# Patient Record
Sex: Male | Born: 1950
Health system: Southern US, Academic
[De-identification: ages and names within clinical notes are randomized; demographics above are authoritative.]

## PROBLEM LIST (undated history)

## (undated) ENCOUNTER — Encounter: Attending: Gastroenterology | Primary: Gastroenterology

## (undated) ENCOUNTER — Encounter: Attending: Nephrology | Primary: Nephrology

## (undated) ENCOUNTER — Ambulatory Visit

## (undated) ENCOUNTER — Encounter

## (undated) ENCOUNTER — Telehealth

## (undated) ENCOUNTER — Inpatient Hospital Stay

## (undated) ENCOUNTER — Ambulatory Visit
Attending: Student in an Organized Health Care Education/Training Program | Primary: Student in an Organized Health Care Education/Training Program

## (undated) ENCOUNTER — Encounter: Attending: Family Medicine | Primary: Family Medicine

## (undated) ENCOUNTER — Ambulatory Visit: Payer: MEDICARE

## (undated) ENCOUNTER — Encounter: Attending: Internal Medicine | Primary: Internal Medicine

## (undated) ENCOUNTER — Telehealth: Attending: Urology | Primary: Urology

## (undated) ENCOUNTER — Telehealth
Attending: Student in an Organized Health Care Education/Training Program | Primary: Student in an Organized Health Care Education/Training Program

## (undated) ENCOUNTER — Ambulatory Visit: Payer: MEDICARE | Attending: Diagnostic Radiology | Primary: Diagnostic Radiology

## (undated) ENCOUNTER — Ambulatory Visit: Payer: MEDICARE | Attending: Nephrology | Primary: Nephrology

## (undated) ENCOUNTER — Ambulatory Visit: Attending: Nephrology | Primary: Nephrology

## (undated) DIAGNOSIS — Z940 Kidney transplant status: Secondary | ICD-10-CM

## (undated) DIAGNOSIS — N289 Disorder of kidney and ureter, unspecified: Secondary | ICD-10-CM

## (undated) DIAGNOSIS — E079 Disorder of thyroid, unspecified: Secondary | ICD-10-CM

## (undated) DIAGNOSIS — IMO0001 Reserved for inherently not codable concepts without codable children: Secondary | ICD-10-CM

## (undated) DIAGNOSIS — I10 Essential (primary) hypertension: Secondary | ICD-10-CM

## (undated) HISTORY — PX: OTHER SURGICAL HISTORY: SHX169

## (undated) HISTORY — PX: NEPHRECTOMY TRANSPLANTED ORGAN: SUR880

---

## 2004-10-09 ENCOUNTER — Ambulatory Visit: Payer: Self-pay | Admitting: Nephrology

## 2005-02-12 ENCOUNTER — Ambulatory Visit: Payer: Self-pay | Admitting: Internal Medicine

## 2006-12-14 DIAGNOSIS — Z940 Kidney transplant status: Secondary | ICD-10-CM

## 2006-12-14 HISTORY — DX: Kidney transplant status: Z94.0

## 2006-12-23 ENCOUNTER — Ambulatory Visit: Payer: Self-pay | Admitting: Nephrology

## 2007-08-16 ENCOUNTER — Ambulatory Visit: Payer: Self-pay | Admitting: Internal Medicine

## 2007-08-25 ENCOUNTER — Ambulatory Visit: Payer: Self-pay | Admitting: Internal Medicine

## 2011-08-14 ENCOUNTER — Ambulatory Visit: Payer: Self-pay

## 2011-09-25 ENCOUNTER — Ambulatory Visit: Payer: Self-pay

## 2011-12-25 LAB — CBC WITH DIFFERENTIAL/PLATELET
Basophil #: 0 10*3/uL (ref 0.0–0.1)
Eosinophil #: 0.2 10*3/uL (ref 0.0–0.7)
HCT: 35.8 % — ABNORMAL LOW (ref 40.0–52.0)
Lymphocyte #: 1.3 10*3/uL (ref 1.0–3.6)
MCH: 26.2 pg (ref 26.0–34.0)
MCHC: 31.3 g/dL — ABNORMAL LOW (ref 32.0–36.0)
MCV: 84 fL (ref 80–100)
Monocyte #: 0.4 10*3/uL (ref 0.0–0.7)
Monocyte %: 6.5 %
Neutrophil #: 4.7 10*3/uL (ref 1.4–6.5)
Platelet: 165 10*3/uL (ref 150–440)
RDW: 13.5 % (ref 11.5–14.5)

## 2011-12-25 LAB — BASIC METABOLIC PANEL
Anion Gap: 8 (ref 7–16)
BUN: 50 mg/dL — ABNORMAL HIGH (ref 7–18)
Chloride: 107 mmol/L (ref 98–107)
Creatinine: 2.13 mg/dL — ABNORMAL HIGH (ref 0.60–1.30)
EGFR (Non-African Amer.): 34 — ABNORMAL LOW
Glucose: 95 mg/dL (ref 65–99)
Osmolality: 294 (ref 275–301)
Potassium: 4 mmol/L (ref 3.5–5.1)
Sodium: 141 mmol/L (ref 136–145)

## 2011-12-30 LAB — CBC WITH DIFFERENTIAL/PLATELET
Basophil #: 0.1 10*3/uL (ref 0.0–0.1)
Eosinophil #: 0.1 10*3/uL (ref 0.0–0.7)
Eosinophil %: 1.7 %
HGB: 11.1 g/dL — ABNORMAL LOW (ref 13.0–18.0)
Lymphocyte #: 1.3 10*3/uL (ref 1.0–3.6)
Lymphocyte %: 18 %
MCHC: 30.8 g/dL — ABNORMAL LOW (ref 32.0–36.0)
MCV: 84 fL (ref 80–100)
Monocyte %: 8.8 %
Neutrophil %: 70.8 %
Platelet: 143 10*3/uL — ABNORMAL LOW (ref 150–440)
RBC: 4.31 10*6/uL — ABNORMAL LOW (ref 4.40–5.90)
RDW: 13 % (ref 11.5–14.5)
WBC: 7.2 10*3/uL (ref 3.8–10.6)

## 2011-12-30 LAB — BASIC METABOLIC PANEL
Calcium, Total: 9.5 mg/dL (ref 8.5–10.1)
Chloride: 103 mmol/L (ref 98–107)
Co2: 28 mmol/L (ref 21–32)
Creatinine: 2.04 mg/dL — ABNORMAL HIGH (ref 0.60–1.30)
Glucose: 111 mg/dL — ABNORMAL HIGH (ref 65–99)
Osmolality: 286 (ref 275–301)
Potassium: 3.7 mmol/L (ref 3.5–5.1)
Sodium: 138 mmol/L (ref 136–145)

## 2011-12-30 LAB — MAGNESIUM: Magnesium: 1.8 mg/dL

## 2012-03-29 ENCOUNTER — Ambulatory Visit: Payer: Self-pay | Admitting: Internal Medicine

## 2012-04-04 ENCOUNTER — Ambulatory Visit: Payer: Self-pay

## 2012-04-04 LAB — PHOSPHORUS: Phosphorus: 2.6 mg/dL (ref 2.5–4.9)

## 2012-04-04 LAB — BASIC METABOLIC PANEL
Anion Gap: 8 (ref 7–16)
Creatinine: 2 mg/dL — ABNORMAL HIGH (ref 0.60–1.30)
EGFR (African American): 41 — ABNORMAL LOW
EGFR (Non-African Amer.): 36 — ABNORMAL LOW
Glucose: 95 mg/dL (ref 65–99)
Osmolality: 282 (ref 275–301)
Potassium: 4.1 mmol/L (ref 3.5–5.1)
Sodium: 134 mmol/L — ABNORMAL LOW (ref 136–145)

## 2012-04-04 LAB — CBC WITH DIFFERENTIAL/PLATELET
Basophil #: 0 10*3/uL (ref 0.0–0.1)
Basophil %: 0.1 %
Eosinophil %: 1.1 %
HGB: 11.7 g/dL — ABNORMAL LOW (ref 13.0–18.0)
Lymphocyte #: 1.7 10*3/uL (ref 1.0–3.6)
MCH: 25.8 pg — ABNORMAL LOW (ref 26.0–34.0)
MCHC: 31.5 g/dL — ABNORMAL LOW (ref 32.0–36.0)
MCV: 82 fL (ref 80–100)
Monocyte #: 0.9 x10 3/mm (ref 0.2–1.0)
Monocyte %: 8.9 %
Neutrophil #: 7.9 10*3/uL — ABNORMAL HIGH (ref 1.4–6.5)
RBC: 4.51 10*6/uL (ref 4.40–5.90)
RDW: 13.8 % (ref 11.5–14.5)
WBC: 10.6 10*3/uL (ref 3.8–10.6)

## 2012-04-04 LAB — MAGNESIUM: Magnesium: 1.6 mg/dL — ABNORMAL LOW

## 2012-06-02 ENCOUNTER — Ambulatory Visit: Payer: Self-pay

## 2012-06-02 LAB — CBC WITH DIFFERENTIAL/PLATELET
Basophil #: 0 10*3/uL (ref 0.0–0.1)
Eosinophil #: 0.1 10*3/uL (ref 0.0–0.7)
MCH: 25.8 pg — ABNORMAL LOW (ref 26.0–34.0)
MCHC: 31.1 g/dL — ABNORMAL LOW (ref 32.0–36.0)
MCV: 83 fL (ref 80–100)
Monocyte #: 0.5 x10 3/mm (ref 0.2–1.0)
Monocyte %: 9.1 %
Neutrophil %: 70.1 %
Platelet: 138 10*3/uL — ABNORMAL LOW (ref 150–440)

## 2012-06-02 LAB — BASIC METABOLIC PANEL
Anion Gap: 9 (ref 7–16)
BUN: 53 mg/dL — ABNORMAL HIGH (ref 7–18)
Chloride: 105 mmol/L (ref 98–107)
Co2: 26 mmol/L (ref 21–32)
Creatinine: 2.07 mg/dL — ABNORMAL HIGH (ref 0.60–1.30)
EGFR (African American): 40 — ABNORMAL LOW
EGFR (Non-African Amer.): 34 — ABNORMAL LOW
Osmolality: 295 (ref 275–301)
Potassium: 3.6 mmol/L (ref 3.5–5.1)
Sodium: 140 mmol/L (ref 136–145)

## 2012-06-02 LAB — PHOSPHORUS: Phosphorus: 3 mg/dL (ref 2.5–4.9)

## 2012-06-02 LAB — MAGNESIUM: Magnesium: 1.7 mg/dL — ABNORMAL LOW

## 2012-07-11 ENCOUNTER — Ambulatory Visit: Payer: Self-pay | Admitting: Emergency Medicine

## 2012-09-08 LAB — BASIC METABOLIC PANEL
Anion Gap: 7 (ref 7–16)
BUN: 47 mg/dL — ABNORMAL HIGH (ref 7–18)
Chloride: 106 mmol/L (ref 98–107)
Creatinine: 1.9 mg/dL — ABNORMAL HIGH (ref 0.60–1.30)
EGFR (African American): 44 — ABNORMAL LOW
EGFR (Non-African Amer.): 38 — ABNORMAL LOW
Glucose: 121 mg/dL — ABNORMAL HIGH (ref 65–99)
Osmolality: 295 (ref 275–301)
Potassium: 3.9 mmol/L (ref 3.5–5.1)

## 2012-09-08 LAB — CBC WITH DIFFERENTIAL/PLATELET
Basophil %: 0.4 %
Eosinophil %: 1.7 %
HCT: 36.7 % — ABNORMAL LOW (ref 40.0–52.0)
Lymphocyte #: 1.2 10*3/uL (ref 1.0–3.6)
Lymphocyte %: 20.4 %
MCH: 26 pg (ref 26.0–34.0)
MCHC: 31.1 g/dL — ABNORMAL LOW (ref 32.0–36.0)
MCV: 84 fL (ref 80–100)
Monocyte #: 0.5 x10 3/mm (ref 0.2–1.0)
Monocyte %: 9.5 %
Neutrophil #: 3.9 10*3/uL (ref 1.4–6.5)
Neutrophil %: 68 %
Platelet: 130 10*3/uL — ABNORMAL LOW (ref 150–440)
RDW: 14.3 % (ref 11.5–14.5)

## 2012-09-08 LAB — PHOSPHORUS: Phosphorus: 2.5 mg/dL (ref 2.5–4.9)

## 2012-09-08 LAB — MAGNESIUM: Magnesium: 1.6 mg/dL — ABNORMAL LOW

## 2012-10-07 LAB — CBC WITH DIFFERENTIAL/PLATELET
Basophil #: 0 10*3/uL (ref 0.0–0.1)
Basophil %: 0.4 %
Eosinophil %: 1.8 %
HCT: 35.8 % — ABNORMAL LOW (ref 40.0–52.0)
HGB: 11.1 g/dL — ABNORMAL LOW (ref 13.0–18.0)
MCH: 26 pg (ref 26.0–34.0)
MCHC: 31.1 g/dL — ABNORMAL LOW (ref 32.0–36.0)
MCV: 84 fL (ref 80–100)
Monocyte #: 0.6 x10 3/mm (ref 0.2–1.0)
Monocyte %: 10.2 %
Neutrophil #: 3.9 10*3/uL (ref 1.4–6.5)
Neutrophil %: 64.4 %
RBC: 4.28 10*6/uL — ABNORMAL LOW (ref 4.40–5.90)
WBC: 6 10*3/uL (ref 3.8–10.6)

## 2012-10-07 LAB — MAGNESIUM: Magnesium: 1.5 mg/dL — ABNORMAL LOW

## 2012-10-07 LAB — BASIC METABOLIC PANEL
Anion Gap: 8 (ref 7–16)
Chloride: 104 mmol/L (ref 98–107)
Co2: 26 mmol/L (ref 21–32)
Creatinine: 1.9 mg/dL — ABNORMAL HIGH (ref 0.60–1.30)
Sodium: 138 mmol/L (ref 136–145)

## 2012-10-07 LAB — PHOSPHORUS: Phosphorus: 2.6 mg/dL (ref 2.5–4.9)

## 2012-12-22 LAB — PHOSPHORUS: Phosphorus: 2.8 mg/dL (ref 2.5–4.9)

## 2012-12-22 LAB — CBC WITH DIFFERENTIAL/PLATELET
Basophil #: 0 10*3/uL (ref 0.0–0.1)
Eosinophil #: 0.1 10*3/uL (ref 0.0–0.7)
Eosinophil %: 1.5 %
HCT: 36 % — ABNORMAL LOW (ref 40.0–52.0)
Lymphocyte #: 0.9 10*3/uL — ABNORMAL LOW (ref 1.0–3.6)
MCV: 82 fL (ref 80–100)
Monocyte #: 0.7 x10 3/mm (ref 0.2–1.0)
Neutrophil #: 4.2 10*3/uL (ref 1.4–6.5)
Neutrophil %: 71.5 %
Platelet: 125 10*3/uL — ABNORMAL LOW (ref 150–440)
RBC: 4.41 10*6/uL (ref 4.40–5.90)
RDW: 14 % (ref 11.5–14.5)
WBC: 5.9 10*3/uL (ref 3.8–10.6)

## 2012-12-22 LAB — BASIC METABOLIC PANEL
Anion Gap: 9 (ref 7–16)
Calcium, Total: 9.6 mg/dL (ref 8.5–10.1)
Co2: 26 mmol/L (ref 21–32)
Creatinine: 2.13 mg/dL — ABNORMAL HIGH (ref 0.60–1.30)
EGFR (Non-African Amer.): 33 — ABNORMAL LOW
Glucose: 107 mg/dL — ABNORMAL HIGH (ref 65–99)
Osmolality: 294 (ref 275–301)

## 2013-01-10 ENCOUNTER — Ambulatory Visit: Payer: Self-pay | Admitting: Family Medicine

## 2013-04-07 ENCOUNTER — Encounter: Payer: Self-pay | Admitting: Nephrology

## 2013-04-07 LAB — CBC WITH DIFFERENTIAL/PLATELET
Basophil #: 0 10*3/uL (ref 0.0–0.1)
Basophil %: 0.4 %
Eosinophil %: 2.3 %
HCT: 34 % — ABNORMAL LOW (ref 40.0–52.0)
Lymphocyte #: 1 10*3/uL (ref 1.0–3.6)
Lymphocyte %: 17.8 %
MCH: 24.9 pg — ABNORMAL LOW (ref 26.0–34.0)
MCHC: 30.5 g/dL — ABNORMAL LOW (ref 32.0–36.0)
MCV: 82 fL (ref 80–100)
Monocyte #: 0.6 x10 3/mm (ref 0.2–1.0)
Monocyte %: 11 %
Platelet: 177 10*3/uL (ref 150–440)
WBC: 5.9 10*3/uL (ref 3.8–10.6)

## 2013-04-07 LAB — BASIC METABOLIC PANEL
BUN: 38 mg/dL — ABNORMAL HIGH (ref 7–18)
Calcium, Total: 9.4 mg/dL (ref 8.5–10.1)
Chloride: 106 mmol/L (ref 98–107)
Co2: 28 mmol/L (ref 21–32)
Creatinine: 2.05 mg/dL — ABNORMAL HIGH (ref 0.60–1.30)
EGFR (African American): 40 — ABNORMAL LOW
Glucose: 92 mg/dL (ref 65–99)
Osmolality: 292 (ref 275–301)
Potassium: 4 mmol/L (ref 3.5–5.1)
Sodium: 142 mmol/L (ref 136–145)

## 2013-04-07 LAB — MAGNESIUM: Magnesium: 1.5 mg/dL — ABNORMAL LOW

## 2013-04-13 ENCOUNTER — Encounter: Payer: Self-pay | Admitting: Nephrology

## 2013-06-01 ENCOUNTER — Ambulatory Visit: Payer: Self-pay | Admitting: Nephrology

## 2013-06-01 LAB — CREATININE, SERUM
Creatinine: 2.22 mg/dL — ABNORMAL HIGH (ref 0.60–1.30)
EGFR (African American): 36 — ABNORMAL LOW
EGFR (Non-African Amer.): 31 — ABNORMAL LOW

## 2013-06-01 LAB — PHOSPHORUS: Phosphorus: 2.8 mg/dL (ref 2.5–4.9)

## 2013-06-01 LAB — CBC WITH DIFFERENTIAL/PLATELET
Basophil %: 0.6 %
HCT: 34.9 % — ABNORMAL LOW (ref 40.0–52.0)
Lymphocyte #: 1.1 10*3/uL (ref 1.0–3.6)
MCHC: 30.9 g/dL — ABNORMAL LOW (ref 32.0–36.0)
MCV: 81 fL (ref 80–100)
Monocyte #: 0.6 x10 3/mm (ref 0.2–1.0)
Neutrophil #: 3.3 10*3/uL (ref 1.4–6.5)
Platelet: 135 10*3/uL — ABNORMAL LOW (ref 150–440)
RBC: 4.31 10*6/uL — ABNORMAL LOW (ref 4.40–5.90)
RDW: 14.5 % (ref 11.5–14.5)
WBC: 5.1 10*3/uL (ref 3.8–10.6)

## 2013-06-01 LAB — MAGNESIUM: Magnesium: 1.3 mg/dL — ABNORMAL LOW

## 2013-06-01 LAB — CO2, TOTAL: Co2: 29 mmol/L (ref 21–32)

## 2013-06-01 LAB — CHLORIDE: Chloride: 102 mmol/L (ref 98–107)

## 2013-06-01 LAB — GLUCOSE, RANDOM: Glucose: 92 mg/dL (ref 65–99)

## 2013-06-01 LAB — POTASSIUM: Potassium: 4.3 mmol/L (ref 3.5–5.1)

## 2013-07-18 ENCOUNTER — Encounter: Payer: Self-pay | Admitting: Nephrology

## 2013-07-18 LAB — PHOSPHORUS: Phosphorus: 2.8 mg/dL (ref 2.5–4.9)

## 2013-07-18 LAB — BASIC METABOLIC PANEL
BUN: 50 mg/dL — ABNORMAL HIGH (ref 7–18)
Calcium, Total: 9.8 mg/dL (ref 8.5–10.1)
Chloride: 102 mmol/L (ref 98–107)
Co2: 25 mmol/L (ref 21–32)
Creatinine: 2.3 mg/dL — ABNORMAL HIGH (ref 0.60–1.30)
EGFR (African American): 35 — ABNORMAL LOW
Glucose: 147 mg/dL — ABNORMAL HIGH (ref 65–99)
Osmolality: 294 (ref 275–301)

## 2013-07-18 LAB — CBC WITH DIFFERENTIAL/PLATELET
Basophil #: 0 10*3/uL (ref 0.0–0.1)
Basophil %: 0.1 %
Eosinophil %: 0 %
HCT: 35.1 % — ABNORMAL LOW (ref 40.0–52.0)
HGB: 11.2 g/dL — ABNORMAL LOW (ref 13.0–18.0)
MCHC: 31.9 g/dL — ABNORMAL LOW (ref 32.0–36.0)
MCV: 82 fL (ref 80–100)
Monocyte #: 0.3 x10 3/mm (ref 0.2–1.0)
Neutrophil %: 90.7 %
RBC: 4.3 10*6/uL — ABNORMAL LOW (ref 4.40–5.90)
RDW: 14.2 % (ref 11.5–14.5)
WBC: 8.3 10*3/uL (ref 3.8–10.6)

## 2013-08-14 ENCOUNTER — Encounter: Payer: Self-pay | Admitting: Nephrology

## 2013-09-29 ENCOUNTER — Ambulatory Visit: Payer: Self-pay | Admitting: Nephrology

## 2013-09-29 LAB — CBC WITH DIFFERENTIAL/PLATELET
Basophil #: 0 x10 3/mm 3
Basophil %: 0.4 %
Eosinophil #: 0.1 x10 3/mm 3
Eosinophil %: 1.9 %
HCT: 32.8 % — ABNORMAL LOW
HGB: 10.1 g/dL — ABNORMAL LOW
Lymphocyte %: 17.3 %
Lymphs Abs: 1 x10 3/mm 3
MCH: 25.5 pg — ABNORMAL LOW
MCHC: 30.8 g/dL — ABNORMAL LOW
MCV: 83 fL
Monocyte #: 0.6 "x10 3/mm "
Monocyte %: 10.2 %
Neutrophil #: 4 x10 3/mm 3
Neutrophil %: 70.2 %
Platelet: 128 x10 3/mm 3 — ABNORMAL LOW
RBC: 3.96 x10 6/mm 3 — ABNORMAL LOW
RDW: 15.1 % — ABNORMAL HIGH
WBC: 5.7 x10 3/mm 3

## 2013-09-29 LAB — BASIC METABOLIC PANEL WITH GFR
Anion Gap: 9
BUN: 41 mg/dL — ABNORMAL HIGH
Calcium, Total: 9.4 mg/dL
Chloride: 107 mmol/L
Co2: 26 mmol/L
Creatinine: 2.13 mg/dL — ABNORMAL HIGH
EGFR (African American): 38 — ABNORMAL LOW
EGFR (Non-African Amer.): 33 — ABNORMAL LOW
Glucose: 111 mg/dL — ABNORMAL HIGH
Osmolality: 294
Potassium: 4.1 mmol/L
Sodium: 142 mmol/L

## 2013-09-29 LAB — PHOSPHORUS: Phosphorus: 2.3 mg/dL — ABNORMAL LOW

## 2013-11-17 ENCOUNTER — Ambulatory Visit: Payer: Self-pay | Admitting: Family Medicine

## 2014-01-26 ENCOUNTER — Ambulatory Visit: Payer: Self-pay

## 2014-01-26 LAB — CBC WITH DIFFERENTIAL/PLATELET
BASOS PCT: 0.5 %
Basophil #: 0 10*3/uL (ref 0.0–0.1)
EOS ABS: 0.5 10*3/uL (ref 0.0–0.7)
Eosinophil %: 9.7 %
HCT: 34 % — ABNORMAL LOW (ref 40.0–52.0)
HGB: 10.8 g/dL — AB (ref 13.0–18.0)
LYMPHS ABS: 1 10*3/uL (ref 1.0–3.6)
Lymphocyte %: 19.2 %
MCH: 26.2 pg (ref 26.0–34.0)
MCHC: 31.8 g/dL — ABNORMAL LOW (ref 32.0–36.0)
MCV: 82 fL (ref 80–100)
MONOS PCT: 12.3 %
Monocyte #: 0.7 x10 3/mm (ref 0.2–1.0)
NEUTROS ABS: 3.2 10*3/uL (ref 1.4–6.5)
Neutrophil %: 58.3 %
Platelet: 144 10*3/uL — ABNORMAL LOW (ref 150–440)
RBC: 4.12 10*6/uL — AB (ref 4.40–5.90)
RDW: 14.6 % — AB (ref 11.5–14.5)
WBC: 5.4 10*3/uL (ref 3.8–10.6)

## 2014-01-26 LAB — PHOSPHORUS: Phosphorus: 3 mg/dL (ref 2.5–4.9)

## 2014-01-26 LAB — COMPREHENSIVE METABOLIC PANEL
ALT: 19 U/L (ref 12–78)
ANION GAP: 9 (ref 7–16)
Albumin: 3.7 g/dL (ref 3.4–5.0)
Alkaline Phosphatase: 82 U/L
BUN: 55 mg/dL — ABNORMAL HIGH (ref 7–18)
Bilirubin,Total: 0.3 mg/dL (ref 0.2–1.0)
CALCIUM: 9.7 mg/dL (ref 8.5–10.1)
CO2: 25 mmol/L (ref 21–32)
CREATININE: 2.57 mg/dL — AB (ref 0.60–1.30)
Chloride: 108 mmol/L — ABNORMAL HIGH (ref 98–107)
EGFR (Non-African Amer.): 26 — ABNORMAL LOW
GFR CALC AF AMER: 30 — AB
Glucose: 101 mg/dL — ABNORMAL HIGH (ref 65–99)
Osmolality: 298 (ref 275–301)
POTASSIUM: 4.4 mmol/L (ref 3.5–5.1)
SGOT(AST): 13 U/L — ABNORMAL LOW (ref 15–37)
Sodium: 142 mmol/L (ref 136–145)
TOTAL PROTEIN: 7.4 g/dL (ref 6.4–8.2)

## 2014-01-26 LAB — LIPID PANEL
Cholesterol: 96 mg/dL (ref 0–200)
HDL Cholesterol: 44 mg/dL (ref 40–60)
Ldl Cholesterol, Calc: 38 mg/dL (ref 0–100)
TRIGLYCERIDES: 72 mg/dL (ref 0–200)
VLDL Cholesterol, Calc: 14 mg/dL (ref 5–40)

## 2014-01-26 LAB — MAGNESIUM: Magnesium: 1.5 mg/dL — ABNORMAL LOW

## 2014-01-26 LAB — GAMMA GT: GGT: 30 U/L (ref 5–85)

## 2014-03-06 ENCOUNTER — Encounter: Payer: Self-pay | Admitting: Nephrology

## 2014-03-06 LAB — CBC WITH DIFFERENTIAL/PLATELET
BASOS ABS: 0 10*3/uL (ref 0.0–0.1)
BASOS PCT: 0.7 %
Eosinophil #: 0.2 10*3/uL (ref 0.0–0.7)
Eosinophil %: 3.2 %
HCT: 31.7 % — ABNORMAL LOW (ref 40.0–52.0)
HGB: 9.6 g/dL — AB (ref 13.0–18.0)
Lymphocyte #: 1.1 10*3/uL (ref 1.0–3.6)
Lymphocyte %: 18.4 %
MCH: 25.2 pg — ABNORMAL LOW (ref 26.0–34.0)
MCHC: 30.4 g/dL — ABNORMAL LOW (ref 32.0–36.0)
MCV: 83 fL (ref 80–100)
MONO ABS: 0.5 x10 3/mm (ref 0.2–1.0)
Monocyte %: 8.6 %
Neutrophil #: 4 10*3/uL (ref 1.4–6.5)
Neutrophil %: 69.1 %
PLATELETS: 184 10*3/uL (ref 150–440)
RBC: 3.81 10*6/uL — ABNORMAL LOW (ref 4.40–5.90)
RDW: 14.1 % (ref 11.5–14.5)
WBC: 5.8 10*3/uL (ref 3.8–10.6)

## 2014-03-06 LAB — MAGNESIUM: MAGNESIUM: 1.3 mg/dL — AB

## 2014-03-06 LAB — BASIC METABOLIC PANEL
Anion Gap: 8 (ref 7–16)
BUN: 49 mg/dL — ABNORMAL HIGH (ref 7–18)
CHLORIDE: 106 mmol/L (ref 98–107)
CO2: 25 mmol/L (ref 21–32)
Calcium, Total: 9.5 mg/dL (ref 8.5–10.1)
Creatinine: 2.19 mg/dL — ABNORMAL HIGH (ref 0.60–1.30)
EGFR (African American): 37 — ABNORMAL LOW
EGFR (Non-African Amer.): 32 — ABNORMAL LOW
GLUCOSE: 95 mg/dL (ref 65–99)
Osmolality: 290 (ref 275–301)
POTASSIUM: 4.3 mmol/L (ref 3.5–5.1)
Sodium: 139 mmol/L (ref 136–145)

## 2014-03-06 LAB — PHOSPHORUS: Phosphorus: 2.6 mg/dL (ref 2.5–4.9)

## 2014-03-14 ENCOUNTER — Encounter: Payer: Self-pay | Admitting: Nephrology

## 2014-04-04 LAB — BASIC METABOLIC PANEL
Anion Gap: 7 (ref 7–16)
BUN: 47 mg/dL — AB (ref 7–18)
Calcium, Total: 9.2 mg/dL (ref 8.5–10.1)
Chloride: 108 mmol/L — ABNORMAL HIGH (ref 98–107)
Co2: 25 mmol/L (ref 21–32)
Creatinine: 2.42 mg/dL — ABNORMAL HIGH (ref 0.60–1.30)
EGFR (Non-African Amer.): 28 — ABNORMAL LOW
GFR CALC AF AMER: 32 — AB
Glucose: 120 mg/dL — ABNORMAL HIGH (ref 65–99)
Osmolality: 293 (ref 275–301)
Potassium: 4.8 mmol/L (ref 3.5–5.1)
SODIUM: 140 mmol/L (ref 136–145)

## 2014-04-04 LAB — CBC WITH DIFFERENTIAL/PLATELET
BASOS PCT: 1.3 %
Basophil #: 0.1 10*3/uL (ref 0.0–0.1)
Eosinophil #: 0 10*3/uL (ref 0.0–0.7)
Eosinophil %: 0.6 %
HCT: 32.5 % — AB (ref 40.0–52.0)
HGB: 9.9 g/dL — AB (ref 13.0–18.0)
LYMPHS PCT: 9.8 %
Lymphocyte #: 0.5 10*3/uL — ABNORMAL LOW (ref 1.0–3.6)
MCH: 25.3 pg — ABNORMAL LOW (ref 26.0–34.0)
MCHC: 30.4 g/dL — AB (ref 32.0–36.0)
MCV: 83 fL (ref 80–100)
MONO ABS: 0.3 x10 3/mm (ref 0.2–1.0)
Monocyte %: 5.6 %
NEUTROS ABS: 4.4 10*3/uL (ref 1.4–6.5)
Neutrophil %: 82.7 %
Platelet: 117 10*3/uL — ABNORMAL LOW (ref 150–440)
RBC: 3.9 10*6/uL — AB (ref 4.40–5.90)
RDW: 14.1 % (ref 11.5–14.5)
WBC: 5.4 10*3/uL (ref 3.8–10.6)

## 2014-04-05 LAB — MAGNESIUM: MAGNESIUM: 1.3 mg/dL — AB

## 2014-04-05 LAB — PHOSPHORUS: Phosphorus: 3 mg/dL (ref 2.5–4.9)

## 2014-04-13 ENCOUNTER — Encounter: Payer: Self-pay | Admitting: Nephrology

## 2014-05-04 LAB — CBC WITH DIFFERENTIAL/PLATELET
BASOS ABS: 0 10*3/uL (ref 0.0–0.1)
Basophil %: 0.7 %
EOS ABS: 0.1 10*3/uL (ref 0.0–0.7)
EOS PCT: 0.9 %
HCT: 35.2 % — AB (ref 40.0–52.0)
HGB: 10.6 g/dL — ABNORMAL LOW (ref 13.0–18.0)
LYMPHS PCT: 8 %
Lymphocyte #: 0.5 10*3/uL — ABNORMAL LOW (ref 1.0–3.6)
MCH: 25.1 pg — ABNORMAL LOW (ref 26.0–34.0)
MCHC: 30.2 g/dL — ABNORMAL LOW (ref 32.0–36.0)
MCV: 83 fL (ref 80–100)
Monocyte #: 0.2 x10 3/mm (ref 0.2–1.0)
Monocyte %: 3.7 %
NEUTROS PCT: 86.7 %
Neutrophil #: 5.5 10*3/uL (ref 1.4–6.5)
Platelet: 137 10*3/uL — ABNORMAL LOW (ref 150–440)
RBC: 4.24 10*6/uL — ABNORMAL LOW (ref 4.40–5.90)
RDW: 14.4 % (ref 11.5–14.5)
WBC: 6.4 10*3/uL (ref 3.8–10.6)

## 2014-05-04 LAB — BASIC METABOLIC PANEL
Anion Gap: 12 (ref 7–16)
BUN: 56 mg/dL — AB (ref 7–18)
Calcium, Total: 9.5 mg/dL (ref 8.5–10.1)
Chloride: 107 mmol/L (ref 98–107)
Co2: 23 mmol/L (ref 21–32)
Creatinine: 2.64 mg/dL — ABNORMAL HIGH (ref 0.60–1.30)
EGFR (Non-African Amer.): 25 — ABNORMAL LOW
GFR CALC AF AMER: 29 — AB
Glucose: 124 mg/dL — ABNORMAL HIGH (ref 65–99)
OSMOLALITY: 300 (ref 275–301)
POTASSIUM: 5 mmol/L (ref 3.5–5.1)
Sodium: 142 mmol/L (ref 136–145)

## 2014-05-04 LAB — PHOSPHORUS: Phosphorus: 3.1 mg/dL (ref 2.5–4.9)

## 2014-05-04 LAB — MAGNESIUM: MAGNESIUM: 1.3 mg/dL — AB

## 2014-05-14 ENCOUNTER — Encounter: Payer: Self-pay | Admitting: Nephrology

## 2014-06-08 LAB — BASIC METABOLIC PANEL
Anion Gap: 10 (ref 7–16)
BUN: 57 mg/dL — ABNORMAL HIGH (ref 7–18)
CHLORIDE: 104 mmol/L (ref 98–107)
CO2: 24 mmol/L (ref 21–32)
Calcium, Total: 9.2 mg/dL (ref 8.5–10.1)
Creatinine: 2.79 mg/dL — ABNORMAL HIGH (ref 0.60–1.30)
EGFR (African American): 27 — ABNORMAL LOW
GFR CALC NON AF AMER: 24 — AB
Glucose: 105 mg/dL — ABNORMAL HIGH (ref 65–99)
OSMOLALITY: 292 (ref 275–301)
Potassium: 4.7 mmol/L (ref 3.5–5.1)
SODIUM: 138 mmol/L (ref 136–145)

## 2014-06-08 LAB — CBC WITH DIFFERENTIAL/PLATELET
Basophil #: 0.1 10*3/uL (ref 0.0–0.1)
Basophil %: 1.1 %
EOS ABS: 0.1 10*3/uL (ref 0.0–0.7)
Eosinophil %: 1.1 %
HCT: 33.6 % — ABNORMAL LOW (ref 40.0–52.0)
HGB: 10.3 g/dL — ABNORMAL LOW (ref 13.0–18.0)
Lymphocyte #: 0.5 10*3/uL — ABNORMAL LOW (ref 1.0–3.6)
Lymphocyte %: 8.5 %
MCH: 25.3 pg — ABNORMAL LOW (ref 26.0–34.0)
MCHC: 30.7 g/dL — AB (ref 32.0–36.0)
MCV: 82 fL (ref 80–100)
MONOS PCT: 7.5 %
Monocyte #: 0.4 x10 3/mm (ref 0.2–1.0)
NEUTROS ABS: 4.8 10*3/uL (ref 1.4–6.5)
NEUTROS PCT: 81.8 %
Platelet: 128 10*3/uL — ABNORMAL LOW (ref 150–440)
RBC: 4.08 10*6/uL — AB (ref 4.40–5.90)
RDW: 14.2 % (ref 11.5–14.5)
WBC: 5.9 10*3/uL (ref 3.8–10.6)

## 2014-06-08 LAB — MAGNESIUM: Magnesium: 1.2 mg/dL — ABNORMAL LOW

## 2014-06-08 LAB — PHOSPHORUS: PHOSPHORUS: 3.1 mg/dL (ref 2.5–4.9)

## 2014-06-13 ENCOUNTER — Encounter: Payer: Self-pay | Admitting: Nephrology

## 2014-06-26 ENCOUNTER — Ambulatory Visit: Payer: Self-pay | Admitting: Family Medicine

## 2014-07-09 LAB — CBC WITH DIFFERENTIAL/PLATELET
BASOS PCT: 0.8 %
Basophil #: 0 10*3/uL (ref 0.0–0.1)
Eosinophil #: 0.1 10*3/uL (ref 0.0–0.7)
Eosinophil %: 1.9 %
HCT: 34.7 % — ABNORMAL LOW (ref 40.0–52.0)
HGB: 10.6 g/dL — AB (ref 13.0–18.0)
Lymphocyte #: 0.7 10*3/uL — ABNORMAL LOW (ref 1.0–3.6)
Lymphocyte %: 13.6 %
MCH: 25 pg — ABNORMAL LOW (ref 26.0–34.0)
MCHC: 30.5 g/dL — ABNORMAL LOW (ref 32.0–36.0)
MCV: 82 fL (ref 80–100)
MONO ABS: 0.4 x10 3/mm (ref 0.2–1.0)
Monocyte %: 7.7 %
NEUTROS ABS: 3.8 10*3/uL (ref 1.4–6.5)
Neutrophil %: 76 %
Platelet: 129 10*3/uL — ABNORMAL LOW (ref 150–440)
RBC: 4.22 10*6/uL — ABNORMAL LOW (ref 4.40–5.90)
RDW: 14.8 % — AB (ref 11.5–14.5)
WBC: 5 10*3/uL (ref 3.8–10.6)

## 2014-07-09 LAB — BASIC METABOLIC PANEL
Anion Gap: 12 (ref 7–16)
BUN: 64 mg/dL — AB (ref 7–18)
CREATININE: 2.83 mg/dL — AB (ref 0.60–1.30)
Calcium, Total: 9.4 mg/dL (ref 8.5–10.1)
Chloride: 108 mmol/L — ABNORMAL HIGH (ref 98–107)
Co2: 22 mmol/L (ref 21–32)
EGFR (African American): 27 — ABNORMAL LOW
EGFR (Non-African Amer.): 23 — ABNORMAL LOW
GLUCOSE: 116 mg/dL — AB (ref 65–99)
OSMOLALITY: 302 (ref 275–301)
POTASSIUM: 4.3 mmol/L (ref 3.5–5.1)
SODIUM: 142 mmol/L (ref 136–145)

## 2014-07-09 LAB — MAGNESIUM: MAGNESIUM: 1.2 mg/dL — AB

## 2014-07-09 LAB — PHOSPHORUS: Phosphorus: 2.9 mg/dL (ref 2.5–4.9)

## 2014-07-14 ENCOUNTER — Encounter: Payer: Self-pay | Admitting: Nephrology

## 2014-07-27 LAB — CBC WITH DIFFERENTIAL/PLATELET
BASOS PCT: 0.6 %
Basophil #: 0 10*3/uL (ref 0.0–0.1)
EOS ABS: 0.1 10*3/uL (ref 0.0–0.7)
EOS PCT: 2.4 %
HCT: 33.3 % — AB (ref 40.0–52.0)
HGB: 10.2 g/dL — ABNORMAL LOW (ref 13.0–18.0)
LYMPHS ABS: 0.6 10*3/uL — AB (ref 1.0–3.6)
Lymphocyte %: 9.8 %
MCH: 25 pg — AB (ref 26.0–34.0)
MCHC: 30.7 g/dL — AB (ref 32.0–36.0)
MCV: 82 fL (ref 80–100)
Monocyte #: 0.4 x10 3/mm (ref 0.2–1.0)
Monocyte %: 7.2 %
Neutrophil #: 4.9 10*3/uL (ref 1.4–6.5)
Neutrophil %: 80 %
Platelet: 119 10*3/uL — ABNORMAL LOW (ref 150–440)
RBC: 4.08 10*6/uL — AB (ref 4.40–5.90)
RDW: 13.9 % (ref 11.5–14.5)
WBC: 6.2 10*3/uL (ref 3.8–10.6)

## 2014-07-27 LAB — MAGNESIUM: Magnesium: 1.1 mg/dL — ABNORMAL LOW

## 2014-07-27 LAB — BASIC METABOLIC PANEL
Anion Gap: 6 — ABNORMAL LOW (ref 7–16)
BUN: 41 mg/dL — ABNORMAL HIGH (ref 7–18)
CALCIUM: 9.3 mg/dL (ref 8.5–10.1)
CHLORIDE: 106 mmol/L (ref 98–107)
Co2: 27 mmol/L (ref 21–32)
Creatinine: 2.31 mg/dL — ABNORMAL HIGH (ref 0.60–1.30)
EGFR (African American): 34 — ABNORMAL LOW
EGFR (Non-African Amer.): 30 — ABNORMAL LOW
Glucose: 98 mg/dL (ref 65–99)
OSMOLALITY: 288 (ref 275–301)
Potassium: 4.1 mmol/L (ref 3.5–5.1)
Sodium: 139 mmol/L (ref 136–145)

## 2014-07-27 LAB — PHOSPHORUS: Phosphorus: 2.6 mg/dL (ref 2.5–4.9)

## 2014-08-07 IMAGING — CR DG CHEST 2V
1 series · 3 of 3 positions shown · non-contrast
Comparison: none

REASON FOR EXAM: cough and chest congestion for 2 weeks.
COMMENTS:

PROCEDURE:     MDR - MDR CHEST PA(OR AP) AND LATERAL  - January 10, 2013 [DATE]
RESULT:     Comparison is made to the study 11 July, 2012.
The heart is borderline to mildly enlarged. The lungs appear clear. Mild
hyperinflation is noted. There is no effusion, pneumothorax or consolidation.

[Series 1: pa · 0.17mm/px · 3 of 3 slices shown]
[im 1/3]
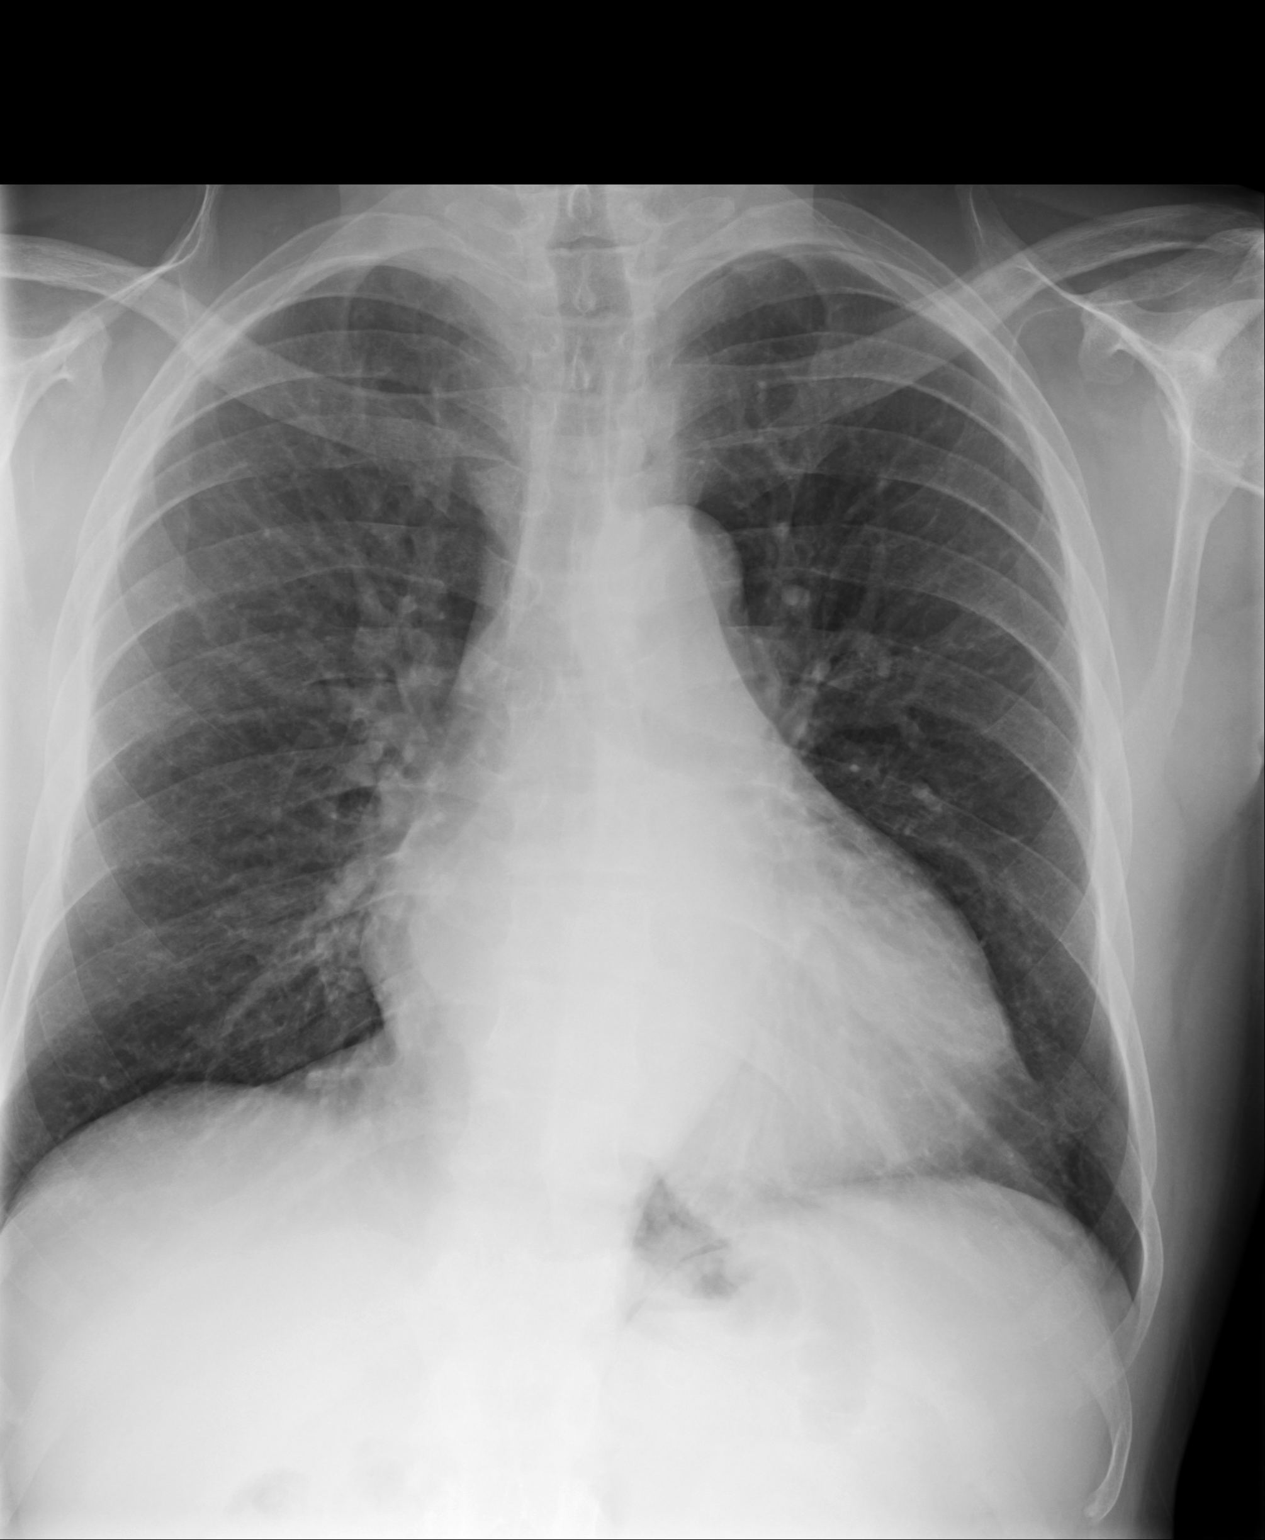
[im 2/3]
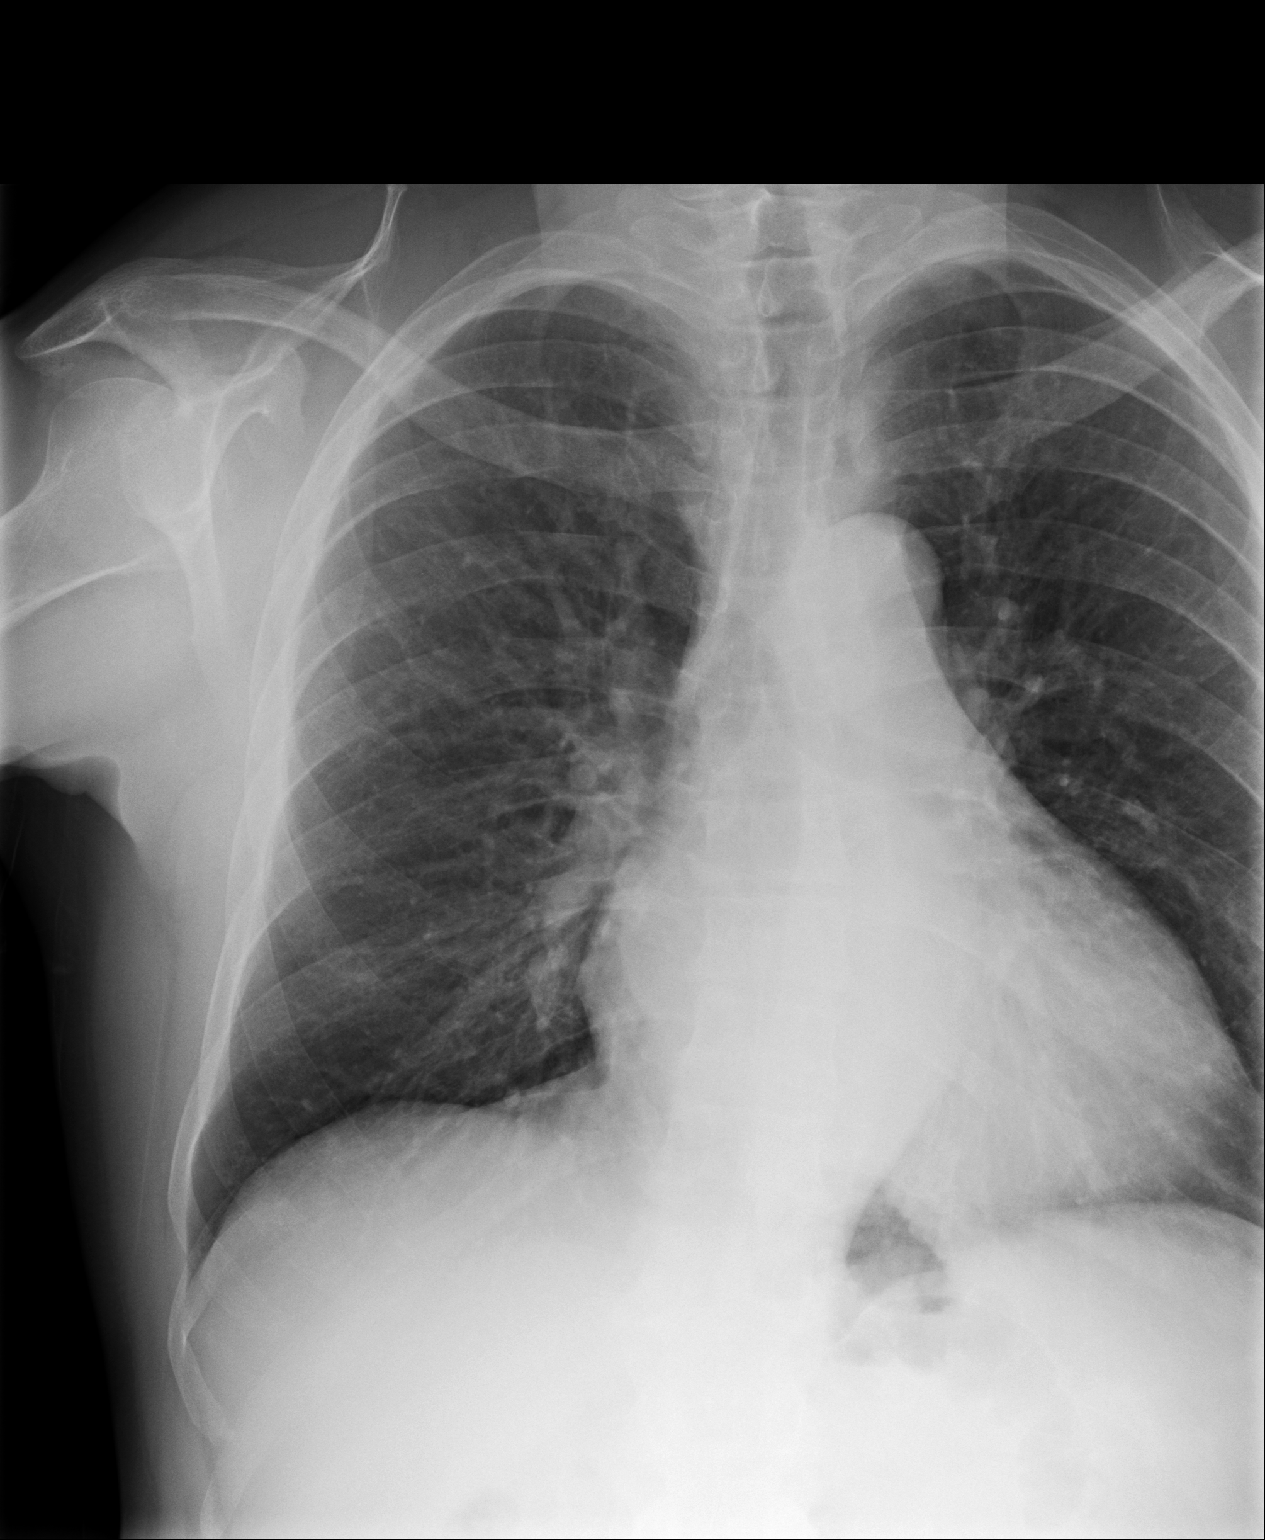
[im 3/3]
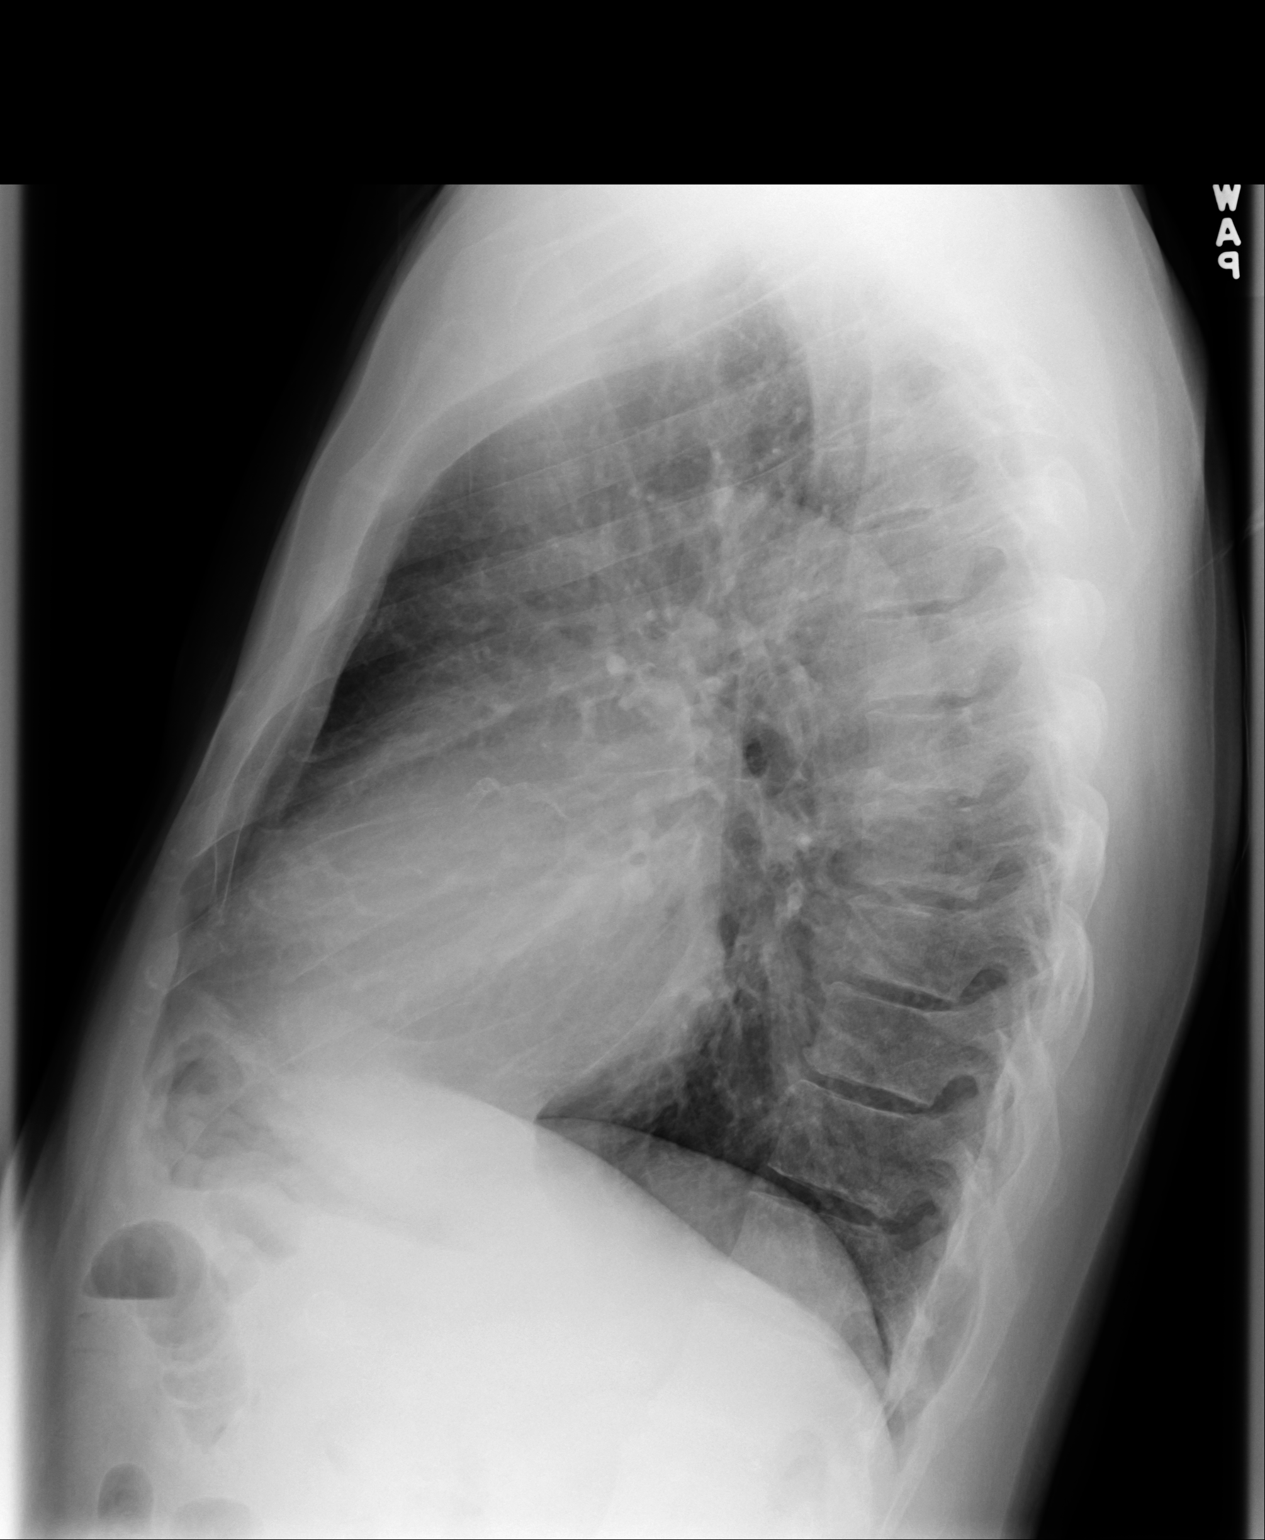

[3 of 3 positions shown; findings below may reference images not displayed]

IMPRESSION: 1. Borderline to mild cardiomegaly. No acute cardiopulmonary disease. Stable
appearance noted.

[REDACTED]

## 2014-08-14 ENCOUNTER — Encounter: Payer: Self-pay | Admitting: Nephrology

## 2014-09-12 LAB — CBC WITH DIFFERENTIAL/PLATELET
Basophil #: 0 10*3/uL (ref 0.0–0.1)
Basophil %: 0.5 %
EOS PCT: 2.6 %
Eosinophil #: 0.1 10*3/uL (ref 0.0–0.7)
HCT: 33.9 % — ABNORMAL LOW (ref 40.0–52.0)
HGB: 10.2 g/dL — ABNORMAL LOW (ref 13.0–18.0)
Lymphocyte #: 0.8 10*3/uL — ABNORMAL LOW (ref 1.0–3.6)
Lymphocyte %: 15.3 %
MCH: 25.1 pg — ABNORMAL LOW (ref 26.0–34.0)
MCHC: 30 g/dL — ABNORMAL LOW (ref 32.0–36.0)
MCV: 84 fL (ref 80–100)
Monocyte #: 0.5 x10 3/mm (ref 0.2–1.0)
Monocyte %: 8.6 %
Neutrophil #: 4 10*3/uL (ref 1.4–6.5)
Neutrophil %: 73 %
Platelet: 142 10*3/uL — ABNORMAL LOW (ref 150–440)
RBC: 4.05 10*6/uL — ABNORMAL LOW (ref 4.40–5.90)
RDW: 14.7 % — AB (ref 11.5–14.5)
WBC: 5.4 10*3/uL (ref 3.8–10.6)

## 2014-09-12 LAB — BASIC METABOLIC PANEL
Anion Gap: 6 — ABNORMAL LOW (ref 7–16)
BUN: 39 mg/dL — ABNORMAL HIGH (ref 7–18)
Calcium, Total: 9.5 mg/dL (ref 8.5–10.1)
Chloride: 103 mmol/L (ref 98–107)
Co2: 28 mmol/L (ref 21–32)
Creatinine: 2.53 mg/dL — ABNORMAL HIGH (ref 0.60–1.30)
EGFR (African American): 34 — ABNORMAL LOW
GFR CALC NON AF AMER: 28 — AB
Glucose: 99 mg/dL (ref 65–99)
OSMOLALITY: 283 (ref 275–301)
Potassium: 4.7 mmol/L (ref 3.5–5.1)
Sodium: 137 mmol/L (ref 136–145)

## 2014-09-13 ENCOUNTER — Encounter: Payer: Self-pay | Admitting: Nephrology

## 2014-10-19 ENCOUNTER — Ambulatory Visit: Payer: Self-pay | Admitting: Internal Medicine

## 2014-11-23 ENCOUNTER — Ambulatory Visit: Payer: Self-pay | Admitting: Nephrology

## 2014-11-23 LAB — CBC WITH DIFFERENTIAL/PLATELET
BASOS ABS: 0 10*3/uL (ref 0.0–0.1)
BASOS PCT: 0.3 %
EOS PCT: 1.1 %
Eosinophil #: 0.1 10*3/uL (ref 0.0–0.7)
HCT: 34.8 % — ABNORMAL LOW (ref 40.0–52.0)
HGB: 10.3 g/dL — ABNORMAL LOW (ref 13.0–18.0)
Lymphocyte #: 0.6 10*3/uL — ABNORMAL LOW (ref 1.0–3.6)
Lymphocyte %: 8.1 %
MCH: 25.1 pg — AB (ref 26.0–34.0)
MCHC: 29.7 g/dL — AB (ref 32.0–36.0)
MCV: 84 fL (ref 80–100)
MONO ABS: 0.6 x10 3/mm (ref 0.2–1.0)
MONOS PCT: 8 %
NEUTROS PCT: 82.5 %
Neutrophil #: 5.9 10*3/uL (ref 1.4–6.5)
PLATELETS: 127 10*3/uL — AB (ref 150–440)
RBC: 4.12 10*6/uL — AB (ref 4.40–5.90)
RDW: 14.1 % (ref 11.5–14.5)
WBC: 7.2 10*3/uL (ref 3.8–10.6)

## 2014-11-23 LAB — MAGNESIUM: MAGNESIUM: 1.5 mg/dL — AB

## 2014-11-23 LAB — BASIC METABOLIC PANEL
Anion Gap: 9 (ref 7–16)
BUN: 35 mg/dL — AB (ref 7–18)
CHLORIDE: 108 mmol/L — AB (ref 98–107)
CREATININE: 2.39 mg/dL — AB (ref 0.60–1.30)
Calcium, Total: 9.5 mg/dL (ref 8.5–10.1)
Co2: 25 mmol/L (ref 21–32)
EGFR (African American): 36 — ABNORMAL LOW
EGFR (Non-African Amer.): 30 — ABNORMAL LOW
GLUCOSE: 91 mg/dL (ref 65–99)
Osmolality: 291 (ref 275–301)
Potassium: 4.4 mmol/L (ref 3.5–5.1)
SODIUM: 142 mmol/L (ref 136–145)

## 2014-11-23 LAB — PHOSPHORUS: PHOSPHORUS: 2.2 mg/dL — AB (ref 2.5–4.9)

## 2014-12-21 ENCOUNTER — Encounter: Payer: Self-pay | Admitting: Nephrology

## 2014-12-21 LAB — CBC WITH DIFFERENTIAL/PLATELET
BASOS ABS: 0 10*3/uL (ref 0.0–0.1)
Basophil %: 0.4 %
Eosinophil #: 0.1 10*3/uL (ref 0.0–0.7)
Eosinophil %: 1.6 %
HCT: 35.6 % — ABNORMAL LOW (ref 40.0–52.0)
HGB: 10.7 g/dL — ABNORMAL LOW (ref 13.0–18.0)
Lymphocyte #: 0.7 10*3/uL — ABNORMAL LOW (ref 1.0–3.6)
Lymphocyte %: 11.5 %
MCH: 25 pg — ABNORMAL LOW (ref 26.0–34.0)
MCHC: 30.1 g/dL — ABNORMAL LOW (ref 32.0–36.0)
MCV: 83 fL (ref 80–100)
MONOS PCT: 7.5 %
Monocyte #: 0.5 x10 3/mm (ref 0.2–1.0)
Neutrophil #: 4.8 10*3/uL (ref 1.4–6.5)
Neutrophil %: 79 %
PLATELETS: 139 10*3/uL — AB (ref 150–440)
RBC: 4.29 10*6/uL — AB (ref 4.40–5.90)
RDW: 14.4 % (ref 11.5–14.5)
WBC: 6.1 10*3/uL (ref 3.8–10.6)

## 2014-12-21 LAB — BASIC METABOLIC PANEL
ANION GAP: 10 (ref 7–16)
BUN: 56 mg/dL — AB (ref 7–18)
CALCIUM: 9.6 mg/dL (ref 8.5–10.1)
CHLORIDE: 111 mmol/L — AB (ref 98–107)
Co2: 22 mmol/L (ref 21–32)
Creatinine: 2.4 mg/dL — ABNORMAL HIGH (ref 0.60–1.30)
EGFR (Non-African Amer.): 29 — ABNORMAL LOW
GFR CALC AF AMER: 36 — AB
GLUCOSE: 103 mg/dL — AB (ref 65–99)
OSMOLALITY: 301 (ref 275–301)
Potassium: 4.3 mmol/L (ref 3.5–5.1)
Sodium: 143 mmol/L (ref 136–145)

## 2014-12-21 LAB — MAGNESIUM: MAGNESIUM: 1.2 mg/dL — AB

## 2014-12-21 LAB — PHOSPHORUS: Phosphorus: 2.6 mg/dL (ref 2.5–4.9)

## 2015-01-14 ENCOUNTER — Encounter: Payer: Self-pay | Admitting: Nephrology

## 2015-04-24 ENCOUNTER — Other Ambulatory Visit
Admission: RE | Admit: 2015-04-24 | Discharge: 2015-04-24 | Disposition: A | Discharge status: Home / Self Care | Payer: Medicare Other | Source: Ambulatory Visit | Attending: Nephrology | Admitting: Nephrology

## 2015-04-24 DIAGNOSIS — Z940 Kidney transplant status: Secondary | ICD-10-CM | POA: Insufficient documentation

## 2015-04-24 LAB — CBC WITH DIFFERENTIAL/PLATELET
BASOS ABS: 0 10*3/uL (ref 0–0.1)
Basophils Relative: 1 %
EOS ABS: 0.1 10*3/uL (ref 0–0.7)
EOS PCT: 1 %
HCT: 34.4 % — ABNORMAL LOW (ref 40.0–52.0)
Hemoglobin: 10.5 g/dL — ABNORMAL LOW (ref 13.0–18.0)
Lymphocytes Relative: 11 %
Lymphs Abs: 0.7 10*3/uL — ABNORMAL LOW (ref 1.0–3.6)
MCH: 24.9 pg — AB (ref 26.0–34.0)
MCHC: 30.6 g/dL — ABNORMAL LOW (ref 32.0–36.0)
MCV: 81.2 fL (ref 80.0–100.0)
Monocytes Absolute: 0.4 10*3/uL (ref 0.2–1.0)
Monocytes Relative: 6 %
Neutro Abs: 5.1 10*3/uL (ref 1.4–6.5)
Neutrophils Relative %: 81 %
PLATELETS: 130 10*3/uL — AB (ref 150–440)
RBC: 4.24 MIL/uL — ABNORMAL LOW (ref 4.40–5.90)
RDW: 14.6 % — AB (ref 11.5–14.5)
WBC: 6.3 10*3/uL (ref 3.8–10.6)

## 2015-04-24 LAB — BASIC METABOLIC PANEL
Anion gap: 7 (ref 5–15)
BUN: 72 mg/dL — AB (ref 6–20)
CO2: 21 mmol/L — ABNORMAL LOW (ref 22–32)
CREATININE: 2.43 mg/dL — AB (ref 0.61–1.24)
Calcium: 9.7 mg/dL (ref 8.9–10.3)
Chloride: 111 mmol/L (ref 101–111)
GFR calc non Af Amer: 27 mL/min — ABNORMAL LOW (ref >60)
GFR, EST AFRICAN AMERICAN: 31 mL/min — AB (ref >60)
Glucose, Bld: 121 mg/dL — ABNORMAL HIGH (ref 65–99)
Potassium: 4.7 mmol/L (ref 3.5–5.1)
Sodium: 139 mmol/L (ref 135–145)

## 2015-04-24 LAB — MAGNESIUM: Magnesium: 1.3 mg/dL — ABNORMAL LOW (ref 1.7–2.4)

## 2015-04-24 LAB — PHOSPHORUS: PHOSPHORUS: 3 mg/dL (ref 2.5–4.6)

## 2015-08-02 ENCOUNTER — Ambulatory Visit
Admission: EM | Admit: 2015-08-02 | Discharge: 2015-08-02 | Disposition: A | Discharge status: Home / Self Care | Payer: Medicare Other | Attending: Family Medicine | Admitting: Family Medicine

## 2015-08-02 ENCOUNTER — Encounter: Payer: Self-pay | Admitting: Emergency Medicine

## 2015-08-02 DIAGNOSIS — H6593 Unspecified nonsuppurative otitis media, bilateral: Secondary | ICD-10-CM

## 2015-08-02 DIAGNOSIS — H109 Unspecified conjunctivitis: Secondary | ICD-10-CM

## 2015-08-02 DIAGNOSIS — J0100 Acute maxillary sinusitis, unspecified: Secondary | ICD-10-CM | POA: Diagnosis not present

## 2015-08-02 HISTORY — DX: Disorder of kidney and ureter, unspecified: N28.9

## 2015-08-02 HISTORY — DX: Kidney transplant status: Z94.0

## 2015-08-02 HISTORY — DX: Disorder of thyroid, unspecified: E07.9

## 2015-08-02 HISTORY — DX: Essential (primary) hypertension: I10

## 2015-08-02 MED ORDER — SALINE SPRAY 0.65 % NA SOLN: 2.0000 | NASAL | Status: AC

## 2015-08-02 MED ORDER — CARBOXYMETHYLCELLULOSE SODIUM 1 % OP SOLN: 1.0000 [drp] | Freq: Three times a day (TID) | OPHTHALMIC | Status: AC

## 2015-08-02 MED ORDER — FLUTICASONE PROPIONATE 50 MCG/ACT NA SUSP: 1.0000 | Freq: Two times a day (BID) | NASAL | Status: AC

## 2015-08-02 MED ORDER — ERYTHROMYCIN 5 MG/GM OP OINT: TOPICAL_OINTMENT | OPHTHALMIC | Status: DC

## 2015-08-02 NOTE — ED Provider Notes (Signed)
CSN: [id number]     Arrival date & time 08/02/15  [id number] History   First MD Initiated Contact with Patient 08/02/15 [phone]     Chief Complaint  Patient presents with  . Eye Drainage  . Cough  . Nasal Congestion   (Consider location/radiation/quality/duration/timing/severity/associated sxs/prior Treatment) HPI Comments: Married african [place] male here with grandson for evaluation of right eye drainage/matting, right cheek pain that started Tuesday 16 Aug after moving granddaughter into college apt 13 Aug was dirty from previous tenant and dogs; heat warning weather; eye right matted shut x 2 days tried extra strength tylenol and saline irrigation with slight improvement of symptoms but still slightly blurry and matted in the mornings slight pain; nasal congestion denied discharge; history seasonal allergies typically does not take medications  Patient is a 64 y.o. male presenting with cough. The history is provided by the patient.  Cough Cough characteristics:  Non-productive Severity:  Mild Onset quality:  Sudden Duration:  4 days Timing:  Intermittent Progression:  Unchanged Chronicity:  New Smoker: no   Context: animal exposure, exposure to allergens and weather changes   Context: not fumes, not occupational exposure, not sick contacts, not smoke exposure, not upper respiratory infection and not with activity   Relieved by:  Nothing Worsened by:  Environmental changes and activity Ineffective treatments:  Rest and fluids Associated symptoms: ear fullness, eye discharge and sinus congestion   Associated symptoms: no chest pain, no chills, no diaphoresis, no ear pain, no fever, no headaches, no myalgias, no rash, no rhinorrhea, no shortness of breath, no sore throat, no weight loss and no wheezing   Risk factors: recent travel   Risk factors: no chemical exposure and no recent infection     Past Medical History  Diagnosis Date  . Kidney transplanted 2008  . Renal disorder   .  Thyroid disease   . Hypertension    Past Surgical History  Procedure Laterality Date  . Nephrectomy transplanted organ     History reviewed. No pertinent family history. Social History  Substance Use Topics  . Smoking status: Never Smoker   . Smokeless tobacco: None  . Alcohol Use: No    Review of Systems  Constitutional: Negative for fever, chills, weight loss, diaphoresis, activity change and appetite change.  HENT: Positive for congestion and sinus pressure. Negative for dental problem, drooling, ear discharge, ear pain, facial swelling, hearing loss, mouth sores, nosebleeds, postnasal drip, rhinorrhea, sneezing, sore throat, tinnitus, trouble swallowing and voice change.   Eyes: Positive for pain, discharge, redness and visual disturbance. Negative for photophobia and itching.  Respiratory: Positive for cough. Negative for choking, chest tightness, shortness of breath, wheezing and stridor.   Cardiovascular: Negative for chest pain, palpitations and leg swelling.  Gastrointestinal: Negative for nausea, vomiting, abdominal pain, diarrhea, constipation, blood in stool, abdominal distention and anal bleeding.  Endocrine: Negative for cold intolerance and heat intolerance.  Genitourinary: Negative for dysuria, frequency, hematuria, flank pain, enuresis and difficulty urinating.  Musculoskeletal: Negative for myalgias, back pain, joint swelling, arthralgias, gait problem, neck pain and neck stiffness.  Skin: Negative for color change, pallor, rash and wound.  Allergic/Immunologic: Positive for environmental allergies. Negative for food allergies.  Neurological: Negative for dizziness, tremors, seizures, syncope, facial asymmetry, speech difficulty, weakness, light-headedness, numbness and headaches.  Hematological: Negative for adenopathy. Does not bruise/bleed easily.  Psychiatric/Behavioral: Negative for behavioral problems, confusion, sleep disturbance and agitation.    Allergies   Penicillins  Home Medications   Prior to Admission medications  Medication Sig Start Date End Date Taking? Authorizing Provider  allopurinol (ZYLOPRIM) 100 MG tablet Take 150 mg by mouth daily.   Yes Historical Provider, MD  aspirin 81 MG tablet Take 81 mg by mouth daily.   Yes Historical Provider, MD  atorvastatin (LIPITOR) 20 MG tablet Take 20 mg by mouth daily.   Yes Historical Provider, MD  calcitRIOL (ROCALTROL) 0.25 MCG capsule Take 0.25 mcg by mouth 3 (three) times a week.   Yes Historical Provider, MD  enalapril (VASOTEC) 5 MG tablet Take 5 mg by mouth 2 (two) times daily.   Yes Historical Provider, MD  ferrous sulfate 325 (65 FE) MG EC tablet Take 325 mg by mouth 3 (three) times daily with meals.   Yes Historical Provider, MD  furosemide (LASIX) 20 MG tablet Take 20 mg by mouth.   Yes Historical Provider, MD  levothyroxine (SYNTHROID, LEVOTHROID) 200 MCG tablet Take 200 mcg by mouth daily before breakfast.   Yes Historical Provider, MD  metoprolol succinate (TOPROL-XL) 100 MG 24 hr tablet Take 100 mg by mouth daily. Take with or immediately following a meal.   Yes Historical Provider, MD  mycophenolate (CELLCEPT) 500 MG tablet Take by mouth 2 (two) times daily.   Yes Historical Provider, MD  omeprazole (PRILOSEC) 40 MG capsule Take 20 mg by mouth daily.    Yes Historical Provider, MD  predniSONE (DELTASONE) 5 MG tablet Take 5 mg by mouth daily with breakfast.   Yes Historical Provider, MD  tacrolimus (PROGRAF) 1 MG capsule Take 3 mg by mouth 2 (two) times daily.   Yes Historical Provider, MD  carboxymethylcellulose 1 % ophthalmic solution Apply 1 drop to eye 3 (three) times daily. 08/02/15   Olen Cordial, NP  erythromycin ophthalmic ointment Place a 1/2 inch ribbon of ointment into the lower eyelid right four times per day 7 to 10 days 08/02/15   Olen Cordial, NP  fluticasone (FLONASE) 50 MCG/ACT nasal spray Place 1 spray into both nostrils 2 (two) times daily. 08/02/15   Olen Cordial, NP  sodium chloride (OCEAN) 0.65 % SOLN nasal spray Place 2 sprays into both nostrils every 2 (two) hours while awake. 08/02/15   Aura Fey Betancourt, NP   BP 142/77 mmHg  Pulse 69  Temp(Src) 96.1 F (35.6 C) (Oral)  Resp 16  SpO2 100% Physical Exam  Constitutional: He is oriented to person, place, and time. Vital signs are normal. He appears well-developed and well-nourished. No distress.  HENT:  Head: Normocephalic and atraumatic.  Right Ear: Hearing, external ear and ear canal normal. A middle ear effusion is present.  Left Ear: Hearing, external ear and ear canal normal. A middle ear effusion is present.  Nose: Mucosal edema and rhinorrhea present. Right sinus exhibits no maxillary sinus tenderness and no frontal sinus tenderness. Left sinus exhibits no maxillary sinus tenderness and no frontal sinus tenderness.  Mouth/Throat: Uvula is midline and mucous membranes are normal. Mucous membranes are not pale, not dry and not cyanotic. He does not have dentures. No oral lesions. No trismus in the jaw. Normal dentition. No dental abscesses, uvula swelling, lacerations or dental caries. Posterior oropharyngeal edema and posterior oropharyngeal erythema present. No oropharyngeal exudate or tonsillar abscesses.  Bilateral TMs with air fluid level clear; cobblestoning posterior pharynx; bilateral nasal turbinates with edema/erythema clear discharge  Eyes: EOM and lids are normal. Pupils are equal, round, and reactive to light. Right eye exhibits no chemosis, no discharge, no exudate and no hordeolum. No  foreign body present in the right eye. Left eye exhibits no chemosis, no discharge and no exudate. No foreign body present in the left eye. Right conjunctiva is injected. Right conjunctiva has no hemorrhage. Left conjunctiva is not injected. Left conjunctiva has no hemorrhage. No scleral icterus. Right eye exhibits normal extraocular motion and no nystagmus. Left eye exhibits normal  extraocular motion and no nystagmus. Right pupil is round and reactive. Left pupil is round and reactive. Pupils are equal.  Vessels excoriated right bulbar lower injection 2+/4 right eyelids and bulbar; no dye uptake with fluorescein staining and woods lamp evaluation negative for corneal abrasion; snellen DVA 20/25 ou, os and 20/30 od with glasses  Neck: Trachea normal and normal range of motion. Neck supple. No tracheal deviation present.  Cardiovascular: Normal rate, regular rhythm, normal heart sounds and intact distal pulses.  Exam reveals no gallop and no friction rub.   No murmur heard. Pulmonary/Chest: Effort normal and breath sounds normal. No stridor. No respiratory distress. He has no wheezes. He has no rales. He exhibits no tenderness.  Abdominal: Soft. Bowel sounds are normal. He exhibits no distension and no mass. There is no tenderness. There is no rebound and no guarding.  Musculoskeletal: Normal range of motion. He exhibits no edema or tenderness.  Lymphadenopathy:    He has no cervical adenopathy.  Neurological: He is alert and oriented to person, place, and time. He exhibits normal muscle tone. Coordination normal.  Skin: Skin is warm, dry and intact. No rash noted. He is not diaphoretic. No erythema. No pallor.  Psychiatric: He has a normal mood and affect. His speech is normal and behavior is normal. Judgment and thought content normal. Cognition and memory are normal.  Nursing note and vitals reviewed.   ED Course  Procedures (including critical care time) Labs Review Labs Reviewed - No data to display  Imaging Review No results found.   MDM   1. Acute maxillary sinusitis, recurrence not specified   2. Conjunctivitis of right eye   3. Otitis media with effusion, bilateral    Start flonase 1 spray each nostril BID and nasal saline 2 sprays each nostril q2h while awake or neti pot.  Follow up if no improvement with sinus congestion/post nasal drip with PCM next  week or I am working on Monday.  Discussed po antibiotics typically not started until failed flonase patient with penicillin allergy and all other options interfere with his cellcept or prograf, lasix so will hold on po antibiotics at this time.  Postnasal drip causing cough and otitis media effusion bilaterally.  No evidence of systemic bacterial infection, non toxic and well hydrated.  I do not see where any further testing or imaging is necessary at this time.   I will suggest supportive care, rest, good hygiene and encourage the patient to take adequate fluids.  The patient is to return to clinic or EMERGENCY ROOM if symptoms worsen or change significantly.  Exitcare handout on sinusitis given to patient.  Patient verbalized agreement and understanding of treatment plan and had no further questions at this time.   P2:  Hand washing and cover cough  Rx erythromycin ointment apply QID right eye 1/2 inch for 7 to 10 days.  Refresh drops or saline irrigation prn q2h right eye.  Discussed not to touch containers to eyeball/eyelids.  Go to ER this weekend if worsening blurred vision/loss of vision, eyelid erythema/orbital swelling.  Probable viral conjunctivitis  Retired no work note required.  Hygiene discussed.  Patient to apply warm packs prn as directed.  Instructed patient to not rub eyes.  May need to wash pillowcases more frequently until infection resolves.  May use over the counter eye drops/tears for pain/symptom relief.  Return to clinic if headache, fever greater than 100.38F, nausea/vomiting, purulent discharge/matting unable to open eye without using fingers, foreign body sensation, ciliary flush, worsening photophobia or vision.  Call or return to clinic as needed if these symptoms worsen or fail to improve as anticipated.  Patient given Exitcare handout on viral/bacterial conjunctivitis.  Patient verbalized agreement and understanding of treatment plan.   P2:  Hand washing, avoid rubbing  eyes  Supportive treatment.   No evidence of invasive bacterial infection, non toxic and well hydrated.  This is most likely self limiting viral infection.  I do not see where any further testing or imaging is necessary at this time.   I will suggest supportive care, rest, good hygiene and encourage the patient to take adequate fluids.  The patient is to return to clinic or EMERGENCY ROOM if symptoms worsen or change significantly e.g. ear pain, fever, purulent discharge from ears or bleeding.  Exitcare handout on otitis media with effusion given to patient.  Patient verbalized agreement and understanding of treatment plan.     Olen Cordial, NP 08/02/15 1020

## 2015-08-02 NOTE — Discharge Instructions (Signed)
Conjunctivitis Conjunctivitis is commonly called "pink eye." Conjunctivitis can be caused by bacterial or viral infection, allergies, or injuries. There is usually redness of the lining of the eye, itching, discomfort, and sometimes discharge. There may be deposits of matter along the eyelids. A viral infection usually causes a watery discharge, while a bacterial infection causes a yellowish, thick discharge. Pink eye is very contagious and spreads by direct contact. You may be given antibiotic eyedrops as part of your treatment. Before using your eye medicine, remove all drainage from the eye by washing gently with warm water and cotton balls. Continue to use the medication until you have awakened 2 mornings in a row without discharge from the eye. Do not rub your eye. This increases the irritation and helps spread infection. Use separate towels from other household members. Wash your hands with soap and water before and after touching your eyes. Use cold compresses to reduce pain and sunglasses to relieve irritation from light. Do not wear contact lenses or wear eye makeup until the infection is gone. SEEK MEDICAL CARE IF:   Your symptoms are not better after 3 days of treatment.  You have increased pain or trouble seeing.  The outer eyelids become very red or swollen. Document Released: 01/07/2005 Document Revised: 02/22/2012 Document Reviewed: 11/30/2005 [facility] Patient Information 2015 [place], [place]. This information is not intended to replace advice given to you by your health care provider. Make sure you discuss any questions you have with your health care provider. Otitis Media With Effusion Otitis media with effusion is the presence of fluid in the middle ear. This is a common problem in children, which often follows ear infections. It may be present for weeks or longer after the infection. Unlike an acute ear infection, otitis media with effusion refers only to fluid behind the ear drum and  not infection. Children with repeated ear and sinus infections and allergy problems are the most likely to get otitis media with effusion. CAUSES  The most frequent cause of the fluid buildup is dysfunction of the eustachian tubes. These are the tubes that drain fluid in the ears to the back of the nose (nasopharynx). SYMPTOMS   The main symptom of this condition is hearing loss. As a result, you or your child may:  Listen to the TV at a loud volume.  Not respond to questions.  Ask "what" often when spoken to.  Mistake or confuse one sound or word for another.  There may be a sensation of fullness or pressure but usually not pain. DIAGNOSIS   Your health care provider will diagnose this condition by examining you or your child's ears.  Your health care provider may test the pressure in you or your child's ear with a tympanometer.  A hearing test may be conducted if the problem persists. TREATMENT   Treatment depends on the duration and the effects of the effusion.  Antibiotics, decongestants, nose drops, and cortisone-type drugs (tablets or nasal spray) may not be helpful.  Children with persistent ear effusions may have delayed language or behavioral problems. Children at risk for developmental delays in hearing, learning, and speech may require referral to a specialist earlier than children not at risk.  You or your child's health care provider may suggest a referral to an ear, nose, and throat surgeon for treatment. The following may help restore normal hearing:  Drainage of fluid.  Placement of ear tubes (tympanostomy tubes).  Removal of adenoids (adenoidectomy). HOME CARE INSTRUCTIONS   Avoid secondhand  smoke.  Infants who are breastfed are less likely to have this condition.  Avoid feeding infants while they are lying flat.  Avoid known environmental allergens.  Avoid people who are sick. SEEK MEDICAL CARE IF:   Hearing is not better in 3 months.  Hearing is  worse.  Ear pain.  Drainage from the ear.  Dizziness. MAKE SURE YOU:   Understand these instructions.  Will watch your condition.  Will get help right away if you are not doing well or get worse. Document Released: 01/07/2005 Document Revised: 04/16/2014 Document Reviewed: 06/27/2013 [facility] Patient Information 2015 [place], [place]. This information is not intended to replace advice given to you by your health care provider. Make sure you discuss any questions you have with your health care provider. Sinusitis Sinusitis is redness, soreness, and inflammation of the paranasal sinuses. Paranasal sinuses are air pockets within the bones of your face (beneath the eyes, the middle of the forehead, or above the eyes). In healthy paranasal sinuses, mucus is able to drain out, and air is able to circulate through them by way of your nose. However, when your paranasal sinuses are inflamed, mucus and air can become trapped. This can allow bacteria and other germs to grow and cause infection. Sinusitis can develop quickly and last only a short time (acute) or continue over a long period (chronic). Sinusitis that lasts for more than 12 weeks is considered chronic.  CAUSES  Causes of sinusitis include:  Allergies.  Structural abnormalities, such as displacement of the cartilage that separates your nostrils (deviated septum), which can decrease the air flow through your nose and sinuses and affect sinus drainage.  Functional abnormalities, such as when the small hairs (cilia) that line your sinuses and help remove mucus do not work properly or are not present. SIGNS AND SYMPTOMS  Symptoms of acute and chronic sinusitis are the same. The primary symptoms are pain and pressure around the affected sinuses. Other symptoms include:  Upper toothache.  Earache.  Headache.  Bad breath.  Decreased sense of smell and taste.  A cough, which worsens when you are lying  flat.  Fatigue.  Fever.  Thick drainage from your nose, which often is green and may contain pus (purulent).  Swelling and warmth over the affected sinuses. DIAGNOSIS  Your health care provider will perform a physical exam. During the exam, your health care provider may:  Look in your nose for signs of abnormal growths in your nostrils (nasal polyps).  Tap over the affected sinus to check for signs of infection.  View the inside of your sinuses (endoscopy) using an imaging device that has a light attached (endoscope). If your health care provider suspects that you have chronic sinusitis, one or more of the following tests may be recommended:  Allergy tests.  Nasal culture. A sample of mucus is taken from your nose, sent to a lab, and screened for bacteria.  Nasal cytology. A sample of mucus is taken from your nose and examined by your health care provider to determine if your sinusitis is related to an allergy. TREATMENT  Most cases of acute sinusitis are related to a viral infection and will resolve on their own within 10 days. Sometimes medicines are prescribed to help relieve symptoms (pain medicine, decongestants, nasal steroid sprays, or saline sprays).  However, for sinusitis related to a bacterial infection, your health care provider will prescribe antibiotic medicines. These are medicines that will help kill the bacteria causing the infection.  Rarely, sinusitis is  caused by a fungal infection. In theses cases, your health care provider will prescribe antifungal medicine. For some cases of chronic sinusitis, surgery is needed. Generally, these are cases in which sinusitis recurs more than 3 times per year, despite other treatments. HOME CARE INSTRUCTIONS   Drink plenty of water. Water helps thin the mucus so your sinuses can drain more easily.  Use a humidifier.  Inhale steam 3 to 4 times a day (for example, sit in the bathroom with the shower running).  Apply a warm,  moist washcloth to your face 3 to 4 times a day, or as directed by your health care provider.  Use saline nasal sprays to help moisten and clean your sinuses.  Take medicines only as directed by your health care provider.  If you were prescribed either an antibiotic or antifungal medicine, finish it all even if you start to feel better. SEEK IMMEDIATE MEDICAL CARE IF:  You have increasing pain or severe headaches.  You have nausea, vomiting, or drowsiness.  You have swelling around your face.  You have vision problems.  You have a stiff neck.  You have difficulty breathing. MAKE SURE YOU:   Understand these instructions.  Will watch your condition.  Will get help right away if you are not doing well or get worse. Document Released: 11/30/2005 Document Revised: 04/16/2014 Document Reviewed: 12/15/2011 [facility] Patient Information 2015 [place], [place]. This information is not intended to replace advice given to you by your health care provider. Make sure you discuss any questions you have with your health care provider.

## 2015-08-02 NOTE — ED Notes (Signed)
Pt states that for a week he has had right eye drainage and waking up with right eye closed shut when he wake up in the morning r/t dried up drainage, with cough and nasal congestion

## 2015-09-09 ENCOUNTER — Other Ambulatory Visit
Admission: RE | Admit: 2015-09-09 | Discharge: 2015-09-09 | Disposition: A | Discharge status: Home / Self Care | Payer: Medicare Other | Source: Ambulatory Visit | Attending: Nephrology | Admitting: Nephrology

## 2015-09-09 DIAGNOSIS — E1129 Type 2 diabetes mellitus with other diabetic kidney complication: Secondary | ICD-10-CM | POA: Insufficient documentation

## 2015-09-09 DIAGNOSIS — D631 Anemia in chronic kidney disease: Secondary | ICD-10-CM | POA: Diagnosis not present

## 2015-09-09 DIAGNOSIS — E559 Vitamin D deficiency, unspecified: Secondary | ICD-10-CM | POA: Insufficient documentation

## 2015-09-09 DIAGNOSIS — Z09 Encounter for follow-up examination after completed treatment for conditions other than malignant neoplasm: Secondary | ICD-10-CM | POA: Diagnosis not present

## 2015-09-09 DIAGNOSIS — N390 Urinary tract infection, site not specified: Secondary | ICD-10-CM | POA: Insufficient documentation

## 2015-09-09 DIAGNOSIS — D899 Disorder involving the immune mechanism, unspecified: Secondary | ICD-10-CM | POA: Insufficient documentation

## 2015-09-09 DIAGNOSIS — Z79899 Other long term (current) drug therapy: Secondary | ICD-10-CM | POA: Diagnosis not present

## 2015-09-09 DIAGNOSIS — Z114 Encounter for screening for human immunodeficiency virus [HIV]: Secondary | ICD-10-CM | POA: Insufficient documentation

## 2015-09-09 DIAGNOSIS — Z940 Kidney transplant status: Secondary | ICD-10-CM | POA: Diagnosis not present

## 2015-09-09 DIAGNOSIS — Z789 Other specified health status: Secondary | ICD-10-CM | POA: Diagnosis not present

## 2015-09-09 LAB — MAGNESIUM: MAGNESIUM: 1.3 mg/dL — AB (ref 1.7–2.4)

## 2015-09-09 LAB — CBC WITH DIFFERENTIAL/PLATELET
Basophils Absolute: 0 10*3/uL (ref 0–0.1)
Eosinophils Absolute: 0.1 10*3/uL (ref 0–0.7)
Eosinophils Relative: 2 %
HEMATOCRIT: 32.2 % — AB (ref 40.0–52.0)
HEMOGLOBIN: 9.7 g/dL — AB (ref 13.0–18.0)
LYMPHS ABS: 0.6 10*3/uL — AB (ref 1.0–3.6)
Lymphocytes Relative: 12 %
MCH: 24.7 pg — ABNORMAL LOW (ref 26.0–34.0)
MCHC: 30.2 g/dL — ABNORMAL LOW (ref 32.0–36.0)
MCV: 81.8 fL (ref 80.0–100.0)
Monocytes Absolute: 0.4 10*3/uL (ref 0.2–1.0)
NEUTROS ABS: 4.2 10*3/uL (ref 1.4–6.5)
Platelets: 107 10*3/uL — ABNORMAL LOW (ref 150–440)
RBC: 3.94 MIL/uL — AB (ref 4.40–5.90)
RDW: 14.7 % — ABNORMAL HIGH (ref 11.5–14.5)
WBC: 5.3 10*3/uL (ref 3.8–10.6)

## 2015-09-09 LAB — BASIC METABOLIC PANEL
ANION GAP: 9 (ref 5–15)
BUN: 52 mg/dL — AB (ref 6–20)
CHLORIDE: 109 mmol/L (ref 101–111)
CO2: 24 mmol/L (ref 22–32)
Calcium: 9.6 mg/dL (ref 8.9–10.3)
Creatinine, Ser: 2.4 mg/dL — ABNORMAL HIGH (ref 0.61–1.24)
GFR calc Af Amer: 32 mL/min — ABNORMAL LOW (ref >60)
GFR calc non Af Amer: 28 mL/min — ABNORMAL LOW (ref >60)
GLUCOSE: 107 mg/dL — AB (ref 65–99)
POTASSIUM: 4.6 mmol/L (ref 3.5–5.1)
Sodium: 142 mmol/L (ref 135–145)

## 2015-09-09 LAB — HEPATIC FUNCTION PANEL
ALBUMIN: 3.7 g/dL (ref 3.5–5.0)
ALK PHOS: 68 U/L (ref 38–126)
ALT: 12 U/L — ABNORMAL LOW (ref 17–63)
AST: 17 U/L (ref 15–41)
BILIRUBIN TOTAL: 0.6 mg/dL (ref 0.3–1.2)
Total Protein: 7.2 g/dL (ref 6.5–8.1)

## 2015-09-09 LAB — LIPID PANEL
CHOL/HDL RATIO: 2.6 ratio
Cholesterol: 117 mg/dL (ref 0–200)
HDL: 45 mg/dL (ref >40)
LDL CALC: 60 mg/dL (ref 0–99)
TRIGLYCERIDES: 62 mg/dL (ref <150)
VLDL: 12 mg/dL (ref 0–40)

## 2015-09-09 LAB — PHOSPHORUS: Phosphorus: 2.9 mg/dL (ref 2.5–4.6)

## 2015-09-09 LAB — GAMMA GT: GGT: 24 U/L (ref 7–50)

## 2015-11-13 ENCOUNTER — Ambulatory Visit
Admission: EM | Admit: 2015-11-13 | Discharge: 2015-11-13 | Disposition: A | Discharge status: Home / Self Care | Payer: Medicare Other | Attending: Family Medicine | Admitting: Family Medicine

## 2015-11-13 ENCOUNTER — Encounter: Payer: Self-pay | Admitting: Emergency Medicine

## 2015-11-13 DIAGNOSIS — J019 Acute sinusitis, unspecified: Secondary | ICD-10-CM | POA: Diagnosis not present

## 2015-11-13 HISTORY — DX: Reserved for inherently not codable concepts without codable children: IMO0001

## 2015-11-13 MED ORDER — DOXYCYCLINE HYCLATE 100 MG PO TABS: 100.0000 mg | ORAL_TABLET | Freq: Two times a day (BID) | ORAL | Status: DC

## 2015-11-13 NOTE — ED Provider Notes (Signed)
CSN: [id number]     Arrival date & time 11/13/15  1012 History   First MD Initiated Contact with Patient 11/13/15 1133     Chief Complaint  Patient presents with  . URI  . Facial Pain   (Consider location/radiation/quality/duration/timing/severity/associated sxs/prior Treatment) HPI Comments: 64 yo male with a h/o kidney transplant (2008) on multiple immunosuppresants with a 2 week h/o of sinus pressure, sinus drainage, and productive cough. Worsening sinus pressure on the right and cough is productive of yellow sputum. No fevers, chills, chest pain or shortness of breath. Tried allegra with mild relief.   Patient is a 64 y.o. male presenting with URI. The history is provided by the patient.  URI   Past Medical History  Diagnosis Date  . Kidney transplanted 2008  . Renal disorder   . Thyroid disease   . Hypertension   . Kidney dialysis status     in 2000s stopped in 2008 after transplant    Past Surgical History  Procedure Laterality Date  . Nephrectomy transplanted organ    . R arm fistula     History reviewed. No pertinent family history. Social History  Substance Use Topics  . Smoking status: Never Smoker   . Smokeless tobacco: None  . Alcohol Use: No    Review of Systems  Allergies  Penicillins  Home Medications   Prior to Admission medications   Medication Sig Start Date End Date Taking? Authorizing Provider  allopurinol (ZYLOPRIM) 100 MG tablet Take 150 mg by mouth daily.    Historical Provider, MD  aspirin 81 MG tablet Take 81 mg by mouth daily.    Historical Provider, MD  atorvastatin (LIPITOR) 20 MG tablet Take 20 mg by mouth daily.    Historical Provider, MD  calcitRIOL (ROCALTROL) 0.25 MCG capsule Take 0.25 mcg by mouth 3 (three) times a week.    Historical Provider, MD  carboxymethylcellulose 1 % ophthalmic solution Apply 1 drop to eye 3 (three) times daily. 08/02/15   Olen Cordial, NP  doxycycline (VIBRA-TABS) 100 MG tablet Take 1 tablet (100 mg  total) by mouth 2 (two) times daily. 11/13/15   Norval Gable, MD  enalapril (VASOTEC) 5 MG tablet Take 5 mg by mouth 2 (two) times daily.    Historical Provider, MD  erythromycin ophthalmic ointment Place a 1/2 inch ribbon of ointment into the lower eyelid right four times per day 7 to 10 days 08/02/15   Olen Cordial, NP  ferrous sulfate 325 (65 FE) MG EC tablet Take 325 mg by mouth 3 (three) times daily with meals.    Historical Provider, MD  fluticasone (FLONASE) 50 MCG/ACT nasal spray Place 1 spray into both nostrils 2 (two) times daily. 08/02/15   Olen Cordial, NP  furosemide (LASIX) 20 MG tablet Take 20 mg by mouth.    Historical Provider, MD  levothyroxine (SYNTHROID, LEVOTHROID) 200 MCG tablet Take 200 mcg by mouth daily before breakfast.    Historical Provider, MD  metoprolol succinate (TOPROL-XL) 100 MG 24 hr tablet Take 100 mg by mouth daily. Take with or immediately following a meal.    Historical Provider, MD  mycophenolate (CELLCEPT) 500 MG tablet Take by mouth 2 (two) times daily.    Historical Provider, MD  omeprazole (PRILOSEC) 40 MG capsule Take 20 mg by mouth daily.     Historical Provider, MD  predniSONE (DELTASONE) 5 MG tablet Take 5 mg by mouth daily with breakfast.    Historical Provider, MD  sodium chloride (  OCEAN) 0.65 % SOLN nasal spray Place 2 sprays into both nostrils every 2 (two) hours while awake. 08/02/15   Olen Cordial, NP  tacrolimus (PROGRAF) 1 MG capsule Take 3 mg by mouth 2 (two) times daily.    Historical Provider, MD   Meds Ordered and Administered this Visit  Medications - No data to display  BP 137/101 mmHg  Pulse 67  Temp(Src) 98 F (36.7 C) (Oral)  Resp 18  Ht 6' (1.829 m)  Wt 173 lb (78.472 kg)  BMI 23.46 kg/m2  SpO2 97% No data found.   Physical Exam  Constitutional: He appears well-developed and well-nourished. No distress.  HENT:  Head: Normocephalic and atraumatic.  Right Ear: Tympanic membrane, external ear and ear canal  normal.  Left Ear: Tympanic membrane, external ear and ear canal normal.  Nose: Mucosal edema present. Right sinus exhibits maxillary sinus tenderness and frontal sinus tenderness. Left sinus exhibits maxillary sinus tenderness and frontal sinus tenderness.  Mouth/Throat: Uvula is midline, oropharynx is clear and moist and mucous membranes are normal. No oropharyngeal exudate or tonsillar abscesses.  Eyes: Conjunctivae and EOM are normal. Pupils are equal, round, and reactive to light. Right eye exhibits no discharge. Left eye exhibits no discharge. No scleral icterus.  Neck: Normal range of motion. Neck supple. No tracheal deviation present. No thyromegaly present.  Cardiovascular: Normal rate, regular rhythm and normal heart sounds.   Pulmonary/Chest: Effort normal and breath sounds normal. No stridor. No respiratory distress. He has no wheezes. He has no rales. He exhibits no tenderness.  Lymphadenopathy:    He has no cervical adenopathy.  Neurological: He is alert.  Skin: Skin is warm and dry. No rash noted. He is not diaphoretic.  Nursing note and vitals reviewed.   ED Course  Procedures (including critical care time)  Labs Review Labs Reviewed - No data to display  Imaging Review No results found.   Visual Acuity Review  Right Eye Distance:   Left Eye Distance:   Bilateral Distance:    Right Eye Near:   Left Eye Near:    Bilateral Near:         MDM   1. Acute sinusitis, recurrence not specified, unspecified location    Discharge Medication List as of 11/13/2015 12:33 PM    START taking these medications   Details  doxycycline (VIBRA-TABS) 100 MG tablet Take 1 tablet (100 mg total) by mouth 2 (two) times daily., Starting 11/13/2015, Until Discontinued, Normal      1.  diagnosis reviewed with patient 2. rx as per orders above; reviewed possible side effects, interactions, risks and benefits  3. Recommend supportive treatment with otc analgesics, increased  fluids, otc flonase 4. Follow-up prn if symptoms worsen or don't improve    Norval Gable, MD 11/13/15 1248

## 2015-11-13 NOTE — ED Notes (Signed)
Pt reports cold symptoms, stuffy head and sinus congestion for over 2 weeks. Denies fever or chills, having cough too.

## 2017-06-18 ENCOUNTER — Ambulatory Visit
Admission: RE | Admit: 2017-06-18 | Discharge: 2017-06-18 | Disposition: A | Discharge status: Home / Self Care | Payer: MEDICARE

## 2017-06-18 ENCOUNTER — Ambulatory Visit
Admission: RE | Admit: 2017-06-18 | Discharge: 2017-06-18 | Disposition: A | Discharge status: Home / Self Care | Payer: MEDICARE | Attending: Nephrology | Admitting: Nephrology

## 2017-06-18 DIAGNOSIS — E039 Hypothyroidism, unspecified: Secondary | ICD-10-CM

## 2017-06-18 DIAGNOSIS — Z940 Kidney transplant status: Principal | ICD-10-CM

## 2017-06-18 DIAGNOSIS — Z79899 Other long term (current) drug therapy: Secondary | ICD-10-CM

## 2017-06-18 NOTE — Unmapped (Signed)
A urine specimen was collected at the visit.

## 2017-06-18 NOTE — Unmapped (Signed)
Transplant Nephrology Clinic Visit    History of Present Illness    Patient is a 66 y.o. male who underwent deceased donor transplant on 2007-06-13 secondary to lupus nephritis. His post transplant course has been complicated by early cellular rejection treated with Thymoglobulin. At that time, he was enrolled in the JAK-3 inhibitor trial and was taken off study drug. Additionally, he was noted to have a large pericardial effusion of greater than 1 L in 08/2007. At that time his TSH was 258. This improved with pericardiocentesis and thyroid replacement and has not reaccumulated. In 2009, he had coronary artery disease which required stenting. Patient does not have evidence of donor specific antibodies. His creatinine was between 1.5 and 2.2, but has been more in the low to mid 2s since his right native nephrectomy on 02/20/14 for renal cell carcinoma.  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 Recent baseline creatinine has been between 2.3 and 2.4.    He was admitted to [facility] from 9/15 through 08/31/2016 after presenting with fever and leukocytosis. His initial urine culture specimen was lost, but he was treated for presumptive UTI. He improved clinically on antibiotics. During that evaluation he had a CT of the abdomen on 08/29/2016 that showed New indeterminate liver lesions concerning for metastatic disease versus post transplant lymphoproliferative disorder versus abscesses. He then underwent MRI on 9/18 which showed Two liver lesions with minimal enhancement. Differential includes post transplant lymphoproliferative disorder, sequelae of prior infection or hematoma, less likely acute abscess. Metastases unlikely given enhancement characteristics. EBV was negative.    As an outpatient he then underwent a PET scan on 09/14/2016 which showed Enlarging right hepatic lobe lesion with intense FDG uptake and central area of photopenia, concerning for necrotic metastasis, less likely abscess. Stable right hepatic dome lesion with intense FDG uptake, concerning for additional metastasis. After consult with radiology, they felt the best approach was ultrasound guided biopsy which he had on 10/01/2016. Pathology revealed Fragments of liver tissue with parenchymal extinction, mixed inflammatory infiltrates, and reactive-appearing stromal changes (see comment), - Increased iron staining within adjacent intact hepatic parenchyma (grade 3 of 4), - Sinusoidal congestion. The differential diagnosis includes sequelae from an abscess (e.g., inflammatory pseudotumor) versus nonspecific inflammatory changes adjacent to an unsampled mass. Correlation with the radiologic findings and clinical follow up are suggested.    The patient had a repeat MRI on 02/01/17 to visualize lesions seen on MRI in September. His repeat MRI revealed interval moderate decrease in the size of previously detected liver lesions, which could represent the sequelae of resolving right hepatic lobe infection with interval decrease in size of large right hepatic lobe lesion with internal T1 hyperintensity likely reflecting proteinacious material and mild internal and perilesional enhancement. Interval decrease in size and conspicuity of small hepatic dome lesion is also noted and this could also represent a resolving abscess. They recommended a re-scan in approximately 3 months (04/2017). I reviewed the report and images with Dr. Rush Barer who agreed that the lesion continues to look like resolving infection.    Interval history since last visit 02/17/2017    Since last seen, the patient had a repeat MRI on 04/27/17 to follow up on the previously identified lesions. MRI revealed: - decreased size of indeterminate hepatic lesion in segment VII/VI, with surrounding geographic iron deposition, likely sequela of infectious/inflammatory process or resolving hematoma; - unchanged indeterminate 9 mm  hepatic dome lesion; a few unchanged tiny cystic foci in the uncinate process dating back to 2016, likely benign sidebranch intraductal papillary mucinous neoplasms; - no hydronephrosis in transplant kidney; - hepatic and splenic hemosiderosis; cholelithiasis; cardiomegaly. Follow-up MRI/MRCP in one year was recommended.    Patient presents today for follow-up and is feeling overall well. He complains of a scaly rash on his ankles that he has been treating with lotion. He has not yet followed with Dermatology. Home blood pressures are reported as well-controlled, in the 130s/80s. He has not had labs since March. Reports compliance with all of his medications.    Review of Systems    Otherwise on review of systems patient denies fever or chills, chest pain, SOB, PND or orthopnea, lower extremity edema. No dysuria, hematuria or difficulty voiding. All other systems are reviewed and are negative.    Medications    Current Outpatient Prescriptions   Medication Sig Dispense Refill   ??? acyclovir (ZOVIRAX) 400 MG tablet Take 1 tablet (400 mg total) by mouth daily. 90 tablet 3   ??? allopurinol (ZYLOPRIM) 100 MG tablet Take 1.5 tablets (150 mg total) by mouth daily. 135 tablet 3   ??? aspirin (ASPIRIN LOW-STRENGTH) 81 MG chewable tablet Chew 81 mg daily.     ??? atorvastatin (LIPITOR) 20 MG tablet Take 1 tablet (20 mg total) by mouth daily. 90 tablet 3   ??? calcitriol (ROCALTROL) 0.25 MCG capsule Take 1 capsule (0.25 mcg total) by mouth Every Monday, Wednesday, and Friday. 36 capsule 3   ??? CELLCEPT 250 mg capsule Take 1 capsule (250 mg total) by mouth Two (2) times a day. 60 capsule 11   ??? colchicine (COLCRYS) 0.6 mg tablet Take 1 tablet (0.6 mg total) by mouth daily as needed. for up to 1 dose 30 tablet 11   ??? enalapril (VASOTEC) 5 MG tablet Take 1 tablet (5 mg total) by mouth Two (2) times a day. 180 tablet 3   ??? ferrous sulfate 325 (65 FE) MG tablet Take 325 mg by mouth 3 (three) times a day.     ??? fluticasone (FLONASE) 50 mcg/actuation nasal spray 1 spray into each nostril daily as needed.     ??? furosemide (LASIX) 40 MG tablet Take 0.5 tablets (20 mg total) by mouth daily. 45 tablet 3   ??? levothyroxine (SYNTHROID) 200 MCG tablet Take 1 tablet (200 mcg total) by mouth daily at 0600. 90 tablet 3   ??? magnesium oxide (MAG-OX) 400 mg tablet Take 1 tablet (400 mg total) by mouth daily. 30 tablet 11   ??? metoprolol succinate (TOPROL-XL) 100 MG 24 hr tablet Take 1 tablet (100 mg total) by mouth daily. 90 tablet 3   ??? omeprazole (PRILOSEC) 20 MG capsule Take 1 capsule (20 mg total) by mouth daily. 90 capsule 3   ??? predniSONE (DELTASONE) 5 MG tablet Take 1 tablet (5 mg total) by mouth daily. 90 tablet 3   ??? PROGRAF 1 mg capsule Take 3 capsules (3 mg total) by mouth Two (2) times a day. 180 capsule 11   ??? simethicone (GAS-X) 80 MG chewable tablet Chew 1 tablet (80 mg total) every six (6) hours as needed for flatulence. 30 tablet 1   ??? sodium bicarbonate 650 mg tablet Take 1 tablet (650 mg total) by mouth Two (2) times a day. 180 tablet 3     No current facility-administered medications for this visit.        Physical Exam    BP  146/94 (BP Site: L Arm, BP Position: Sitting, BP Cuff Size: Medium)  - Pulse 72  - Temp 35.7 ??C (96.2 ??F) (Temporal)  - Ht 182.9 cm (6' 0.01)  - Wt 79.7 kg (175 lb 12.8 oz)  - BMI 23.84 kg/m??    General: Patient is a pleasant male in no apparent distress.  Appears younger than stated age.  Eyes: Sclera anicteric.  ENT: No erythema or exudate.  Neck: Supple without LAD/JVD/bruits.  Lungs: Clear to auscultation bilaterally, no wheezes/rales/rhonchi.  Cardiovascular: Regular rate and rhythm without murmurs, rubs or gallops.  Abdomen: Soft, notender/nondistended. Positive bowel sounds. No tenderness over the graft.  Extremities: Without edema, joints without evidence of synovitis.   Skin: 2 x 1 inch scaly rash on ankles.    Neurological: Grossly nonfocal. Psychiatric: Mood and affect appropriate.    Laboratory Results    Recent Results (from the past 170 hour(s))   CBC w/ Differential    Collection Time: 06/18/17  8:28 AM   Result Value Ref Range    WBC 5.6 4.5 - 11.0 10*9/L    RBC 4.30 (L) 4.50 - 5.90 10*12/L    HGB 11.9 (L) 13.5 - 17.5 g/dL    HCT 16.1 (L) 09.6 - 53.0 %    MCV 92.1 80.0 - 100.0 fL    MCH 27.8 26.0 - 34.0 pg    MCHC 30.1 (L) 31.0 - 37.0 g/dL    RDW 04.5 40.9 - 81.1 %    MPV 9.3 7.0 - 10.0 fL    Platelet 137 (L) 150 - 440 10*9/L    Absolute Neutrophils 3.5 2.0 - 7.5 10*9/L    Absolute Lymphocytes 1.4 (L) 1.5 - 5.0 10*9/L    Absolute Monocytes 0.4 0.2 - 0.8 10*9/L    Absolute Eosinophils 0.2 0.0 - 0.4 10*9/L    Absolute Basophils 0.0 0.0 - 0.1 10*9/L    Large Unstained Cells 2 0 - 4 %    Hypochromasia Marked (A) Not Present   Comprehensive Metabolic Panel    Collection Time: 06/18/17  8:29 AM   Result Value Ref Range    Sodium 144 135 - 145 mmol/L    Potassium 4.7 3.5 - 5.0 mmol/L    Chloride 106 98 - 107 mmol/L    CO2 27.0 22.0 - 30.0 mmol/L    BUN 46 (H) 7 - 21 mg/dL    Creatinine 9.14 (H) 0.70 - 1.30 mg/dL    BUN/Creatinine Ratio 22     EGFR MDRD Non Af Amer 32 (L) >=60 mL/min/1.19m2    EGFR MDRD Af Amer 39 (L) >=60 mL/min/1.50m2    Anion Gap 11 9 - 15 mmol/L    Glucose 101 (H) 65 - 99 mg/dL    Calcium 9.7 8.5 - 78.2 mg/dL    Albumin 3.5 3.5 - 5.0 g/dL    Total Protein 6.8 6.5 - 8.3 g/dL    Total Bilirubin 0.5 0.0 - 1.2 mg/dL    AST 23 19 - 55 U/L    ALT 26 19 - 72 U/L    Alkaline Phosphatase 79 38 - 126 U/L   Magnesium Level    Collection Time: 06/18/17  8:29 AM   Result Value Ref Range    Magnesium 1.3 (L) 1.6 - 2.2 mg/dL   Phosphorus Level    Collection Time: 06/18/17  8:29 AM   Result Value Ref Range    Phosphorus 3.1 2.9 - 4.7 mg/dL       Assessment and Plan  1. Status post renal transplant. His creatinine is at his baseline. His Prograf level of 7.5 is acceptable. Will continue current immunosuppression. He remains on a reduced dose of CellCept 250mg  BID with history of diarrhea. He was encouraged to get labs monthly.    2. Diarrhea. Resolved. Tolerating CellCept.    3. Right renal cell carcinoma status post nephrectomy. Imaging previously showed hepatic mass that was biopsied and consistent with inflammatory process. Repeat MRI (04/17/17) showed lesion continues to improve. Follow-up scan recommended in one year, due May 2019.    4. Hypertension. Mildly elevated in clinic today at 146/94, however he reports good control at home. Will continue current regimen.    5. Hypothyroidism. TSH has been trending up (2.28 -> 3.28 -> 5.75). Will increase Synthroid to 225 mcg daily (from 200 mcg daily).    6. History of coronary artery disease. Asymptomatic.    7. Hypomagnesemia. Level today 1.3, which I stable. Has had issues with supplementation with diarrhea. Is back on magnesium 400 mg QD and tolerating. Continue to encourage ingestion of high magnesium foods (educational information given in the past).    8. Ankle rash. Recommended OTC Lamisil cream and will refer to Dermatology.    9. Health maintenance. Influenza vaccination completed on 10/21/16. Will receive first dose of Shingrix in clinic today.    10. Will see patient back in 6 months, or sooner if needed.      Scribe's Attestation: Lisbeth Ply, MD obtained and performed the history, physical exam and medical decision making elements that were entered into the chart.  Signed by Gaye Alken, Scribe, on June 18, 2017 at 8:58 AM.    ----------------------------------------------------------------------------------------------------------------------  July 03, 2017 11:56 AM. Documentation assistance provided by the Scribe. I was present during the time the encounter was recorded. The information recorded by the Scribe was done at my direction and has been reviewed and validated by me. ----------------------------------------------------------------------------------------------------------------------

## 2017-06-18 NOTE — Unmapped (Signed)
Met with pt in nephrology clinic today.  He is concerned about red, dry, patchy areas located on both ankles.  He has had them a while.  They are not itchy, but get dry and scaly so he uses lotion on the areas.  He has not seen dermatology about this.  He otherwise feels well and denies fever, cough, N/V/D, swelling or dysuria.  He held prograf prior to blood draw today, last dose was at 2300 last noc.

## 2017-06-22 MED FILL — PROGRAF/1MG/CAP: PROGRAF/1MG/CAP | 30 days supply | Qty: 180 | Fill #9

## 2017-07-12 MED ORDER — LEVOTHYROXINE 25 MCG TABLET
ORAL_TABLET | 3 refills | 0 days | Status: CP
Start: 2017-07-12 — End: 2018-07-19

## 2017-07-12 MED ORDER — LEVOTHYROXINE 200 MCG TABLET
ORAL_TABLET | 3 refills | 0 days | Status: CP
Start: 2017-07-12 — End: 2018-08-16

## 2017-07-12 NOTE — Unmapped (Signed)
Mailers ordered for FK levels

## 2017-07-12 NOTE — Unmapped (Signed)
Per order Dr. Carlene Coria, pt instructed to increase levothyroxine to daily.  New prescription sent to local pharmacy.  Pt was in agreement.

## 2017-07-15 NOTE — Unmapped (Signed)
Specialty Pharmacy Refill Coordination Note     Sharad Vaneaton is a 66 y.o. male contacted today regarding refills of his specialty medication(s).    Reviewed and verified with patient:     [address]  [place] [id number]  Specialty medication(s) and dose(s) confirmed: yes  Changes to medications: no  Changes to insurance: no    Medication Adherence    Patient reported X missed doses in the last month:  0  Specialty Medication:  PROGRAF 1 MG  Medication Assistance Program  Refill Coordination  Has the Patient's Contact Information Changed:  No  Is the Shipping Address Different:  No  Shipping Information  Delivery Scheduled:  Yes  Delivery Date:  08/10/17          Prograf 1 mg   Quantity filled last month: 180   # of tablets left on hand: 42         Ardyth Man  Pension scheme manager

## 2017-07-19 MED FILL — PROGRAF/1MG/CAP: PROGRAF/1MG/CAP | 30 days supply | Qty: 180 | Fill #10

## 2017-07-29 MED ORDER — CELLCEPT 250 MG CAPSULE: each | 11 refills | 0 days

## 2017-07-29 MED ORDER — CELLCEPT 250 MG CAPSULE
ORAL_CAPSULE | Freq: Two times a day (BID) | ORAL | 11 refills | 0.00000 days | Status: CP
Start: 2017-07-29 — End: 2017-07-29

## 2017-07-29 NOTE — Unmapped (Signed)
Per test claim for CELLCEPT 250MG  at the [facility], patient needs Medication Assistance Program for High Copay.

## 2017-07-29 NOTE — Unmapped (Signed)
Cellcept assistance form completed and faxed to PAP.  New rx sent for cellcept

## 2017-08-03 MED ORDER — MYCOPHENOLATE MOFETIL 250 MG CAPSULE
Freq: Two times a day (BID) | ORAL | 1 refills | 0.00000 days | Status: CP
Start: 2017-08-03 — End: 2017-08-03

## 2017-08-03 MED ORDER — MYCOPHENOLATE MOFETIL 250 MG CAPSULE: 250 mg | capsule | Freq: Two times a day (BID) | 1 refills | 0 days | Status: AC

## 2017-08-03 MED FILL — MYCOPHENOLATE MOFETIL/250MG/CAPS: MYCOPHENOLATE MOFETIL/250MG/CAPS | 7 days supply | Qty: 14 | Fill #0

## 2017-08-03 NOTE — Unmapped (Signed)
Pt is out of cellcept.  PAP application for manunfacturer assistance is underway, but he is out of medicine now.  Per [facility], pt can pay for a week's supply of generic at local pharmacy.  However, [facility] does not carry any.  Spoke with pt who was in agreement with plan.  He requested that prescription be sent to [facility] pharmacy because his wife works at the hospital.  Prescription sent.  Note sent to [facility] regarding PAP app.

## 2017-08-10 NOTE — Unmapped (Signed)
[facility] Refill and Clinical Coordination Note  Medication(s): PROGRAF 1MG     [name], DOB: 11/15/1951  Phone: [phone] (home) , Alternate phone contact: N/A  Shipping address: [address]  [place] [address]  Phone or address changes today?: No  All above HIPAA information verified.  Insurance changes? No    Completed refill and clinical call assessment today to schedule patient's medication shipment from the [facility] [phone]).      MEDICATION RECONCILIATION    Confirmed the medication and dosage are correct and have not changed: Yes, regimen is correct and unchanged.    Were there any changes to your medication(s) in the past month:  No, there are no changes reported at this time.    ADHERENCE    Is this medicine transplant or covered by Medicare Part B? Yes.    Prograf 1 mg   Quantity filled last month: 180   # of tablets left on hand: 84      Did you miss any doses in the past 4 weeks? No missed doses reported.  Adherence counseling provided? Not needed     SIDE EFFECT MANAGEMENT    Are you tolerating your medication?:  Carmon reports tolerating the medication.  Side effect management discussed: None      Therapy is appropriate and should be continued.    Evidence of clinical benefit: See Epic note from 06/18/17      FINANCIAL/SHIPPING    Delivery Scheduled: Yes, Expected medication delivery date: 08/19/17   Additional medications refilled: No additional medications/refills needed at this time.    Qunicy did not have any additional questions at this time.    Delivery address validated in [facility] scheduling system: Yes, address listed above is correct.      We will follow up with patient monthly for standard refill processing and delivery.      Thank you,  Marletta Lor   [facility] Specialty Pharmacist

## 2017-08-12 MED ORDER — ENALAPRIL MALEATE 5 MG TABLET
ORAL_TABLET | Freq: Two times a day (BID) | ORAL | 11 refills | 0 days | Status: CP
Start: 2017-08-12 — End: 2017-11-11

## 2017-08-12 NOTE — Unmapped (Signed)
Patient request for RX refill.

## 2017-08-17 MED FILL — PROGRAF/1MG/CAP: PROGRAF/1MG/CAP | 30 days supply | Qty: 180 | Fill #11

## 2017-09-27 MED ORDER — ACYCLOVIR 400 MG TABLET
ORAL_TABLET | Freq: Every day | ORAL | 3 refills | 0.00000 days | Status: CP
Start: 2017-09-27 — End: 2017-12-23

## 2017-09-30 MED ORDER — PROGRAF 1 MG CAPSULE
ORAL_CAPSULE | Freq: Two times a day (BID) | ORAL | 11 refills | 0.00000 days | Status: SS
Start: 2017-09-30 — End: 2018-10-21

## 2017-09-30 MED ORDER — PROGRAF 1 MG CAPSULE: 3 mg | capsule | Freq: Two times a day (BID) | 11 refills | 0 days | Status: AC

## 2017-09-30 MED ORDER — PROGRAF 1 MG CAPSULE: capsule | 11 refills | 0 days | Status: SS

## 2017-09-30 NOTE — Unmapped (Signed)
[facility] Refill Coordination Note  Specialty Medication(s): PROGRAF 1MG   Additional Medications shipped: NO    Miguel Ward, DOB: 02-16-1951  Phone: [phone] (home) , Alternate phone contact: N/A  Phone or address changes today?: No  All above HIPAA information was verified with patient.  Shipping Address: [address]  [facility] [place] [address]   Insurance changes? No    Completed refill call assessment today to schedule patient's medication shipment from the [facility] [phone]).      Confirmed the medication and dosage are correct and have not changed: Yes, regimen is correct and unchanged.    Confirmed patient started or stopped the following medications in the past month:  No, there are no changes reported at this time.    Are you tolerating your medication?:  Miguel Ward reports tolerating the medication.    ADHERENCE    (Below is required for Medicare Part B or Transplant patients only - per drug):   How many tablets were dispensed last month: 180  Patient currently has 60 remaining.    Did you miss any doses in the past 4 weeks? No missed doses reported.    FINANCIAL/SHIPPING    Delivery Scheduled: Yes, Expected medication delivery date: 10/05/17.  However, Rx request for refills was sent to the provider as there are none remaining.     [name] did not have any additional questions at this time.    Delivery address validated in FSI scheduling system: Yes, address listed in FSI is correct.    We will follow up with patient monthly for standard refill processing and delivery.      Thank you,  Miguel Ward   [facility] Specialty Pharmacist

## 2017-10-03 MED FILL — PROGRAF/1MG/CAP: PROGRAF/1MG/CAP | 30 days supply | Qty: 180 | Fill #0

## 2017-10-29 NOTE — Unmapped (Signed)
[facility] Refill Coordination Note  Specialty Medication(s): Prograf 1mg     Miguel Ward, DOB: [birth year]  Phone: [phone] (home) , Alternate phone contact: N/A  Phone or address changes today?: No  All above HIPAA information was verified with patient.  Shipping Address: [address]  [facility] [place] [address]   Insurance changes? No    Completed refill call assessment today to schedule patient's medication shipment from the [facility] [phone]).      Confirmed the medication and dosage are correct and have not changed: Yes, regimen is correct and unchanged.    Confirmed patient started or stopped the following medications in the past month:  No, there are no changes reported at this time.    Are you tolerating your medication?:  [name] reports tolerating the medication.    ADHERENCE    (Below is required for Medicare Part B or Transplant patients only - per drug):   How many tablets were dispensed last month:   Prograf 1 mg   Quantity filled last month: 180   # of tablets left on hand: 10 days    Did you miss any doses in the past 4 weeks? No missed doses reported.    FINANCIAL/SHIPPING    Delivery Scheduled: Yes, Expected medication delivery date: 11/02/2017     Miguel Ward did not have any additional questions at this time.    Delivery address validated in FSI scheduling system: Yes, address listed in FSI is correct.    We will follow up with patient monthly for standard refill processing and delivery.      Thank you,  Tamala Fothergill   [facility] Specialty Technician

## 2017-10-30 MED FILL — PROGRAF/1MG/CAP: PROGRAF/1MG/CAP | 30 days supply | Qty: 180 | Fill #1

## 2017-11-11 MED ORDER — ENALAPRIL MALEATE 5 MG TABLET
ORAL_TABLET | Freq: Two times a day (BID) | ORAL | 11 refills | 0 days | Status: CP
Start: 2017-11-11 — End: 2018-02-14

## 2017-11-25 NOTE — Unmapped (Signed)
[facility] Refill Coordination Note  Specialty Medication(s): PROGRAF 1MG    Additional Medications shipped: NO    [name], DOB: 1951/06/01  Phone: [phone] (home) , Alternate phone contact: N/A  Phone or address changes today?: No  All above HIPAA information was verified with patient.  Shipping Address: [address]  [facility] [place] [address]   Insurance changes? No    Completed refill call assessment today to schedule patient's medication shipment from the [facility] [phone]).      Confirmed the medication and dosage are correct and have not changed: Yes, regimen is correct and unchanged.    Confirmed patient started or stopped the following medications in the past month:  No, there are no changes reported at this time.    Are you tolerating your medication?:  Cardin reports tolerating the medication.    ADHERENCE    (Below is required for Medicare Part B or Transplant patients only - per drug):   How many tablets were dispensed last month: 180  Patient currently has 60 remaining.    Did you miss any doses in the past 4 weeks? No missed doses reported.    FINANCIAL/SHIPPING    Delivery Scheduled: Yes, Expected medication delivery date: 11/30/17     Teofil did not have any additional questions at this time.    Delivery address validated in FSI scheduling system: Yes, address listed in FSI is correct.    We will follow up with patient monthly for standard refill processing and delivery.      Thank you,  Marletta Lor   [facility] Specialty Pharmacist

## 2017-11-29 MED FILL — PROGRAF/1MG/CAP: PROGRAF/1MG/CAP | 30 days supply | Qty: 180 | Fill #2

## 2017-12-02 ENCOUNTER — Other Ambulatory Visit
Admission: RE | Admit: 2017-12-02 | Discharge: 2017-12-02 | Disposition: A | Discharge status: Home / Self Care | Payer: Medicare Other | Source: Ambulatory Visit | Attending: Nephrology | Admitting: Nephrology

## 2017-12-02 ENCOUNTER — Ambulatory Visit
Admission: RE | Admit: 2017-12-02 | Discharge: 2017-12-02 | Disposition: A | Discharge status: Home / Self Care | Payer: MEDICARE

## 2017-12-02 DIAGNOSIS — Z79899 Other long term (current) drug therapy: Secondary | ICD-10-CM

## 2017-12-02 DIAGNOSIS — Z940 Kidney transplant status: Principal | ICD-10-CM

## 2017-12-02 DIAGNOSIS — D899 Disorder involving the immune mechanism, unspecified: Secondary | ICD-10-CM | POA: Insufficient documentation

## 2017-12-02 DIAGNOSIS — D631 Anemia in chronic kidney disease: Secondary | ICD-10-CM | POA: Insufficient documentation

## 2017-12-02 DIAGNOSIS — T8610 Unspecified complication of kidney transplant: Secondary | ICD-10-CM | POA: Insufficient documentation

## 2017-12-02 DIAGNOSIS — Z789 Other specified health status: Secondary | ICD-10-CM | POA: Diagnosis not present

## 2017-12-02 DIAGNOSIS — Z114 Encounter for screening for human immunodeficiency virus [HIV]: Secondary | ICD-10-CM | POA: Insufficient documentation

## 2017-12-02 DIAGNOSIS — N390 Urinary tract infection, site not specified: Secondary | ICD-10-CM | POA: Insufficient documentation

## 2017-12-02 DIAGNOSIS — E1129 Type 2 diabetes mellitus with other diabetic kidney complication: Secondary | ICD-10-CM | POA: Insufficient documentation

## 2017-12-02 DIAGNOSIS — Z9483 Pancreas transplant status: Secondary | ICD-10-CM | POA: Diagnosis not present

## 2017-12-02 DIAGNOSIS — B259 Cytomegaloviral disease, unspecified: Secondary | ICD-10-CM | POA: Diagnosis not present

## 2017-12-02 DIAGNOSIS — Z794 Long term (current) use of insulin: Secondary | ICD-10-CM | POA: Diagnosis not present

## 2017-12-02 DIAGNOSIS — Z09 Encounter for follow-up examination after completed treatment for conditions other than malignant neoplasm: Secondary | ICD-10-CM | POA: Diagnosis not present

## 2017-12-02 DIAGNOSIS — E559 Vitamin D deficiency, unspecified: Secondary | ICD-10-CM | POA: Insufficient documentation

## 2017-12-02 LAB — BASIC METABOLIC PANEL
ANION GAP: 11 (ref 5–15)
BUN: 45 mg/dL — ABNORMAL HIGH (ref 6–20)
CHLORIDE: 104 mmol/L (ref 101–111)
CO2: 26 mmol/L (ref 22–32)
Calcium: 9.1 mg/dL (ref 8.9–10.3)
Creatinine, Ser: 2.18 mg/dL — ABNORMAL HIGH (ref 0.61–1.24)
GFR, EST AFRICAN AMERICAN: 35 mL/min — AB (ref >60)
GFR, EST NON AFRICAN AMERICAN: 30 mL/min — AB (ref >60)
Glucose, Bld: 93 mg/dL (ref 65–99)
POTASSIUM: 4.2 mmol/L (ref 3.5–5.1)
SODIUM: 141 mmol/L (ref 135–145)

## 2017-12-02 LAB — CBC WITH DIFFERENTIAL/PLATELET
BASOS ABS: 0.1 10*3/uL (ref 0–0.1)
Basophils Relative: 1 %
EOS ABS: 0.2 10*3/uL (ref 0–0.7)
Eosinophils Relative: 3 %
HCT: 37.7 % — ABNORMAL LOW (ref 40.0–52.0)
HEMOGLOBIN: 11.8 g/dL — AB (ref 13.0–18.0)
LYMPHS ABS: 1.2 10*3/uL (ref 1.0–3.6)
Lymphocytes Relative: 19 %
MCH: 27.9 pg (ref 26.0–34.0)
MCHC: 31.4 g/dL — ABNORMAL LOW (ref 32.0–36.0)
MCV: 88.9 fL (ref 80.0–100.0)
Monocytes Absolute: 0.7 10*3/uL (ref 0.2–1.0)
Monocytes Relative: 11 %
Neutro Abs: 4.3 10*3/uL (ref 1.4–6.5)
Neutrophils Relative %: 66 %
PLATELETS: 121 10*3/uL — AB (ref 150–440)
RBC: 4.24 MIL/uL — ABNORMAL LOW (ref 4.40–5.90)
RDW: 15.1 % — ABNORMAL HIGH (ref 11.5–14.5)
WBC: 6.5 10*3/uL (ref 3.8–10.6)

## 2017-12-02 LAB — MAGNESIUM: MAGNESIUM: 1.3 mg/dL — AB (ref 1.7–2.4)

## 2017-12-02 LAB — PHOSPHORUS: PHOSPHORUS: 2.6 mg/dL (ref 2.5–4.6)

## 2017-12-04 LAB — TACROLIMUS, TROUGH: Lab: 6.2

## 2017-12-16 LAB — CBC W/ DIFFERENTIAL
BASOPHILS ABSOLUTE COUNT: 0.1 10*9/L
BASOPHILS RELATIVE PERCENT: 1 %
EOSINOPHILS ABSOLUTE COUNT: 0.2 10*9/L
EOSINOPHILS RELATIVE PERCENT: 3 %
HEMATOCRIT: 37.7 % — ABNORMAL LOW
HEMOGLOBIN: 11.8 g/dL — ABNORMAL LOW
LYMPHOCYTES ABSOLUTE COUNT: 1.2 10*9/L
LYMPHOCYTES RELATIVE PERCENT: 19 %
MEAN CORPUSCULAR HEMOGLOBIN CONC: 31.4 g/dL — ABNORMAL LOW
MEAN CORPUSCULAR HEMOGLOBIN: 27.9 pg
MEAN CORPUSCULAR VOLUME: 88.9 fL
MONOCYTES ABSOLUTE COUNT: 0.7 10*9/L
MONOCYTES RELATIVE PERCENT: 11 %
NEUTROPHILS ABSOLUTE COUNT: 4.3 10*9/L
NEUTROPHILS RELATIVE PERCENT: 66 %
PLATELET COUNT: 121 10*9/L — ABNORMAL LOW
RED BLOOD CELL COUNT: 4.24 10*12/L — ABNORMAL LOW
WHITE BLOOD CELL COUNT: 6.5 10*9/L

## 2017-12-16 LAB — BASIC METABOLIC PANEL
CHLORIDE: 104 mmol/L
CO2: 26 mmol/L
CREATININE: 2.18 mg/dL — ABNORMAL HIGH
EGFR MDRD AF AMER: 35 mL/min/{1.73_m2} — ABNORMAL LOW
GLUCOSE RANDOM: 93 mg/dL
POTASSIUM: 4.2 mmol/L
SODIUM: 141 mmol/L

## 2017-12-16 LAB — MAGNESIUM: Lab: 1.3 — ABNORMAL LOW

## 2017-12-16 LAB — GLUCOSE RANDOM: Lab: 93

## 2017-12-16 LAB — PHOSPHORUS: Lab: 2.6

## 2017-12-16 LAB — MONOCYTES RELATIVE PERCENT: Lab: 11

## 2017-12-16 NOTE — Unmapped (Signed)
Transplant Nephrology Clinic Visit    History of Present Illness    Patient is a 67 y.o. male who underwent deceased donor transplant on 06-25-07 secondary to lupus nephritis. His post transplant course has been complicated by early cellular rejection treated with Thymoglobulin. At that time, he was enrolled in the JAK-3 inhibitor trial and was taken off study drug. Additionally, he was noted to have a large pericardial effusion of greater than 1 L in 08/2007. At that time his TSH was 258. This improved with pericardiocentesis and thyroid replacement and has not reaccumulated. In 2009, he had coronary artery disease which required stenting. Patient does not have evidence of donor specific antibodies. His creatinine was between 1.5 and 2.2, but has been more in the low to mid 2s since his right native nephrectomy on 02/20/14 for renal cell carcinoma.  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 Recent baseline creatinine has been between 2.3 and 2.4.    He was admitted to [facility] from 9/15 through 08/31/2016 after presenting with fever and leukocytosis. His initial urine culture specimen was lost, but he was treated for presumptive UTI. He improved clinically on antibiotics. During that evaluation he had a CT of the abdomen on 08/29/2016 that showed New indeterminate liver lesions concerning for metastatic disease versus post transplant lymphoproliferative disorder versus abscesses. He then underwent MRI on 9/18 which showed Two liver lesions with minimal enhancement. Differential includes post transplant lymphoproliferative disorder, sequelae of prior infection or hematoma, less likely acute abscess. Metastases unlikely given enhancement characteristics. EBV was negative.    As an outpatient he then underwent a PET scan on 09/14/2016 which showed Enlarging right hepatic lobe lesion with intense FDG uptake and central area of photopenia, concerning for necrotic metastasis, less likely abscess. Stable right hepatic dome lesion with intense FDG uptake, concerning for additional metastasis. After consult with radiology, they felt the best approach was ultrasound guided biopsy which he had on 10/01/2016. Pathology revealed Fragments of liver tissue with parenchymal extinction, mixed inflammatory infiltrates, and reactive-appearing stromal changes (see comment), - Increased iron staining within adjacent intact hepatic parenchyma (grade 3 of 4), - Sinusoidal congestion. The differential diagnosis includes sequelae from an abscess (e.g., inflammatory pseudotumor) versus nonspecific inflammatory changes adjacent to an unsampled mass. Correlation with the radiologic findings and clinical follow up are suggested.    The patient had a repeat MRI on 02/01/17 to visualize lesions seen on MRI in September. His repeat MRI revealed interval moderate decrease in the size of previously detected liver lesions, which could represent the sequelae of resolving right hepatic lobe infection with interval decrease in size of large right hepatic lobe lesion with internal T1 hyperintensity likely reflecting proteinacious material and mild internal and perilesional enhancement. Interval decrease in size and conspicuity of small hepatic dome lesion is also noted and this could also represent a resolving abscess. They recommended a re-scan in approximately 3 months (04/2017). I reviewed the report and images with Dr. Rush Barer who agreed that the lesion continues to look like resolving infection.    Interval history since last visit 06/18/2017    He presents today complaining of lower exptremty edema at the end of the day that resolves by morning. No SOB/PND/orthopnea. He otherwise feels well and has no other complaints.  The rash he had at his previous visit in July has completely resolved.    Review of Systems  Otherwise on review of systems patient denies fever or chills, chest pain, SOB, PND or orthopnea. No N/V/abdominal pain. No diarrhea. No dysuria, hematuria or difficulty voiding. All other systems are reviewed and are negative.    Medications    Current Outpatient Prescriptions   Medication Sig Dispense Refill   ??? acyclovir (ZOVIRAX) 400 MG tablet Take 1 tablet (400 mg total) by mouth daily. 90 tablet 3   ??? allopurinol (ZYLOPRIM) 100 MG tablet Take 1.5 tablets (150 mg total) by mouth daily. 135 tablet 3   ??? aspirin (ASPIRIN LOW-STRENGTH) 81 MG chewable tablet Chew 81 mg daily.     ??? atorvastatin (LIPITOR) 20 MG tablet Take 1 tablet (20 mg total) by mouth daily. 90 tablet 3   ??? calcitriol (ROCALTROL) 0.25 MCG capsule Take 1 capsule (0.25 mcg total) by mouth Every Monday, Wednesday, and Friday. 36 capsule 3   ??? CELLCEPT 250 mg capsule Take 1 capsule (250 mg total) by mouth Two (2) times a day. 60 capsule 11   ??? colchicine (COLCRYS) 0.6 mg tablet Take 1 tablet (0.6 mg total) by mouth daily as needed. for up to 1 dose 30 tablet 11   ??? enalapril (VASOTEC) 5 MG tablet Take 1 tablet (5 mg total) by mouth Two (2) times a day. 60 tablet 11   ??? ferrous sulfate 325 (65 FE) MG tablet Take 325 mg by mouth 3 (three) times a day.     ??? fluticasone (FLONASE) 50 mcg/actuation nasal spray 1 spray into each nostril daily as needed.     ??? furosemide (LASIX) 40 MG tablet Take 0.5 tablets (20 mg total) by mouth daily. 45 tablet 3   ??? levothyroxine (SYNTHROID) 200 MCG tablet Take 1 tab with one tab, total dose daily 90 tablet 3   ??? levothyroxine (SYNTHROID, LEVOTHROID) 25 MCG tablet Take 1 tab with one tab, total dose daily 90 tablet 3   ??? metoprolol succinate (TOPROL-XL) 100 MG 24 hr tablet Take 1 tablet (100 mg total) by mouth daily. 90 tablet 3   ??? omeprazole (PRILOSEC) 20 MG capsule Take 1 capsule (20 mg total) by mouth daily. 90 capsule 3   ??? predniSONE (DELTASONE) 5 MG tablet Take 1 tablet (5 mg total) by mouth daily. 90 tablet 3   ??? PROGRAF 1 mg capsule Take 3 capsules (3 mg total) by mouth Two (2) times a day. 180 capsule 11   ??? simethicone (GAS-X) 80 MG chewable tablet Chew 1 tablet (80 mg total) every six (6) hours as needed for flatulence. 30 tablet 1   ??? sodium bicarbonate 650 mg tablet Take 1 tablet (650 mg total) by mouth Two (2) times a day. 180 tablet 3     No current facility-administered medications for this visit.        Physical Exam    BP 152/80 (BP Site: L Arm, BP Position: Sitting, BP Cuff Size: Medium)  - Pulse 72  - Temp 36.3 ??C (97.3 ??F) (Temporal)  - Ht 182.9 cm (6')  - Wt 81.6 kg (180 lb)  - BMI 24.41 kg/m??    General: Patient is a pleasant male in no apparent distress.  Appears younger than stated age.  Eyes: Sclera anicteric.  ENT: No erythema or exudate.  Neck: Supple without LAD/JVD/bruits.  Lungs: Clear to auscultation bilaterally, no wheezes/rales/rhonchi.  Cardiovascular: Regular rate and rhythm without murmurs, rubs or gallops.  Abdomen: Soft, notender/nondistended. Positive bowel sounds. No tenderness over the graft.  Extremities:  1-2+ edema of right ankle with trace to no edema on left. Joints without evidence of synovitis.   Skin: Without rash.  Neurological: Grossly nonfocal.  Psychiatric: Mood and affect appropriate.    Laboratory Results    Recent Results (from the past 170 hour(s))   Comprehensive Metabolic Panel    Collection Time: 12/17/17  8:21 AM   Result Value Ref Range    Sodium 143 135 - 145 mmol/L    Potassium 4.5 3.5 - 5.0 mmol/L    Chloride 106 98 - 107 mmol/L    CO2 29.0 22.0 - 30.0 mmol/L    BUN 42 (H) 7 - 21 mg/dL    Creatinine 1.61 (H) 0.70 - 1.30 mg/dL    BUN/Creatinine Ratio 20     EGFR MDRD Non Af Amer 32 (L) >=60 mL/min/1.77m2    EGFR MDRD Af Amer 38 (L) >=60 mL/min/1.5m2    Anion Gap 8 (L) 9 - 15 mmol/L    Glucose 103 (H) 65 - 99 mg/dL    Calcium 9.5 8.5 - 09.6 mg/dL    Albumin 3.3 (L) 3.5 - 5.0 g/dL    Total Protein 6.1 (L) 6.5 - 8.3 g/dL    Total Bilirubin 0.7 0.0 - 1.2 mg/dL    AST 25 19 - 55 U/L    ALT 45 19 - 72 U/L    Alkaline Phosphatase 87 38 - 126 U/L   Magnesium Level    Collection Time: 12/17/17  8:21 AM   Result Value Ref Range    Magnesium 1.2 (L) 1.6 - 2.2 mg/dL   Phosphorus Level    Collection Time: 12/17/17  8:21 AM   Result Value Ref Range    Phosphorus 3.4 2.9 - 4.7 mg/dL   Tacrolimus Level, Trough    Collection Time: 12/17/17  8:21 AM   Result Value Ref Range    Tacrolimus, Trough 11.5 <=20.0 ng/mL   CMV DNA, quantitative, PCR    Collection Time: 12/17/17  8:21 AM   Result Value Ref Range    CMV Viral Ld Not Detected Not Detected    CMV Quant  <0 IU/mL    CMV Quant Log10  <0.00 log IU/mL    CMV Comment     TSH    Collection Time: 12/17/17  8:21 AM   Result Value Ref Range    TSH 3.780 (H) 0.600 - 3.300 uIU/mL   CBC w/ Differential    Collection Time: 12/17/17  8:21 AM   Result Value Ref Range    WBC 6.3 4.5 - 11.0 10*9/L    RBC 4.31 (L) 4.50 - 5.90 10*12/L    HGB 11.9 (L) 13.5 - 17.5 g/dL    HCT 04.5 (L) 40.9 - 53.0 %    MCV 92.2 80.0 - 100.0 fL    MCH 27.6 26.0 - 34.0 pg    MCHC 29.9 (L) 31.0 - 37.0 g/dL    RDW 81.1 91.4 - 78.2 %    MPV 10.1 (H) 7.0 - 10.0 fL    Platelet 131 (L) 150 - 440 10*9/L    Absolute Neutrophils 4.2 2.0 - 7.5 10*9/L    Absolute Lymphocytes 1.3 (L) 1.5 - 5.0 10*9/L    Absolute Monocytes 0.5 0.2 - 0.8 10*9/L    Absolute Eosinophils 0.2 0.0 - 0.4 10*9/L    Absolute Basophils 0.0 0.0 - 0.1 10*9/L    Large Unstained Cells 2 0 - 4 %    Hypochromasia Marked (A) Not Present   Morphology  Review    Collection Time: 12/17/17  8:21 AM   Result Value Ref Range    Smear Review Comments See Comment (A) Undefined    Burr Cells Present (A) Not Present   Urinalysis    Collection Time: 12/17/17  8:44 AM   Result Value Ref Range    Color, UA Yellow     Clarity, UA Clear     Specific Gravity, UA 1.014 1.003 - 1.030    pH, UA 6.5 5.0 - 9.0    Leukocyte Esterase, UA Negative Negative    Nitrite, UA Negative Negative    Protein, UA 100 mg/dL (A) Negative    Glucose, UA Negative Negative    Ketones, UA Negative Negative    Urobilinogen, UA 0.2 mg/dL 0.2 mg/dL, 1.0 mg/dL    Bilirubin, UA Negative Negative    Blood, UA Trace (A) Negative    RBC, UA 2 <3 /HPF    WBC, UA <1 <2 /HPF    Squam Epithel, UA <1 0 - 5 /HPF    Bacteria, UA None Seen None Seen /HPF   Urine Culture    Collection Time: 12/17/17  8:44 AM   Result Value Ref Range    Urine Culture, Comprehensive Mixed Urogenital Flora    Protein/Creatinine Ratio, Urine    Collection Time: 12/17/17  8:44 AM   Result Value Ref Range    Creat U 97.4 Undefined mg/dL    Protein, Ur 161.0 Undefined mg/dL    Protein/Creatinine Ratio, Urine 1.417 Undefined       Assessment and Plan    1. Status post renal transplant. His creatinine is at his baseline. His Prograf level of 11.5 is an 8.5 hour trough. Will continue current immunosuppression. He remains on a reduced dose of CellCept 250mg  BID with history of diarrhea. He was encouraged to get labs monthly which he has not been very good about lately.    2. Diarrhea. Resolved on lower dose CellCept.    3. Right renal cell carcinoma status post nephrectomy. Imaging previously showed hepatic mass that was biopsied and consistent with inflammatory process. Repeat MRI (04/17/17) showed the lesion continues to improve. Follow-up scan recommended in one year, due May 2019.    4. Hypertension. Mildly elevated in clinic today at 152/80, however he reports good control at home. Will continue current regimen.    5. Hypothyroidism. TSH had been trending up (2.28 -> 3.28 -> 5.75) so Synthroid was increased from 200 mcg up to 225 mg daily.  With that, his TSH is back down to 3.7.  Continue current dose.    6. History of coronary artery disease. Asymptomatic.    7. Hypomagnesemia. Level today 1.2, which is stable. Has had issues with supplementation with diarrhea. Is back on magnesium 400 mg QD and tolerating. Continue to encourage ingestion of high magnesium foods (educational information given in the past).    8. Ankle rash. Resolved.    9. Lower extremity edema.  His urine protein to creatinine ratio has been trending up since March of last year, was 0.75, then 0.9, now 1.4.  Will plan for renal biopsy to evaluate further.  He does not have any DSA's as of 06/18/2017.    10. Health maintenance. Influenza vaccination will be given today in clinic. He received first dose of Shingrix in clinic on 06/2017.    11. Will see patient back in 6 months, or sooner if nee indicated by the biopsy results.    Scribe's Attestation: Lisbeth Ply,  MD obtained and performed the history, physical exam and medical decision making elements that were entered into the chart.  Signed by [place] Ormond Foster, Scribe, on December 17, 2017 9:12 AM.    ----------------------------------------------------------------------------------------------------------------------  January 22, 2018 2:53 PM. Documentation assistance provided by the Scribe. I was present during the time the encounter was recorded. The information recorded by the Scribe was done at my direction and has been reviewed and validated by me.  ----------------------------------------------------------------------------------------------------------------------    ADDENDUM: Renal biopsy was performed on 12/30/2017 and revealed:  - No evidence of rejection.  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 ??  - Moderate arterionephrosclerosis.  - Immune complex mediated glomerulopathy - Glomeruli show IgG dominant partially resolved deposits without evidence of activity, I.e. no proliferative glomerular lesions. These changes can indicate a minor recurrence of the lupus nephritis not carrying any direct therapeutic and prognostic significance at the present time.   - Tubular pigment deposits, consistent with lipofuscin.

## 2017-12-17 ENCOUNTER — Other Ambulatory Visit: Admit: 2017-12-17 | Discharge: 2017-12-17 | Payer: MEDICARE

## 2017-12-17 ENCOUNTER — Ambulatory Visit: Admit: 2017-12-17 | Discharge: 2017-12-17 | Payer: MEDICARE | Attending: Nephrology | Primary: Nephrology

## 2017-12-17 DIAGNOSIS — Z79899 Other long term (current) drug therapy: Secondary | ICD-10-CM

## 2017-12-17 DIAGNOSIS — Z940 Kidney transplant status: Principal | ICD-10-CM

## 2017-12-17 DIAGNOSIS — D899 Disorder involving the immune mechanism, unspecified: Secondary | ICD-10-CM

## 2017-12-17 LAB — URINALYSIS
BACTERIA: NONE SEEN /HPF
BILIRUBIN UA: NEGATIVE
GLUCOSE UA: NEGATIVE
KETONES UA: NEGATIVE
LEUKOCYTE ESTERASE UA: NEGATIVE
NITRITE UA: NEGATIVE
PH UA: 6.5 (ref 5.0–9.0)
RBC UA: 2 /HPF (ref <3)
SQUAMOUS EPITHELIAL: <1 /HPF (ref 0–5)
UROBILINOGEN UA: 0.2
WBC UA: <1 /HPF (ref <2)

## 2017-12-17 LAB — COMPREHENSIVE METABOLIC PANEL
ALBUMIN: 3.3 g/dL — ABNORMAL LOW (ref 3.5–5.0)
ALKALINE PHOSPHATASE: 87 U/L (ref 38–126)
ALT (SGPT): 45 U/L (ref 19–72)
AST (SGOT): 25 U/L (ref 19–55)
BILIRUBIN TOTAL: 0.7 mg/dL (ref 0.0–1.2)
BLOOD UREA NITROGEN: 42 mg/dL — ABNORMAL HIGH (ref 7–21)
BUN / CREAT RATIO: 20
CALCIUM: 9.5 mg/dL (ref 8.5–10.2)
CHLORIDE: 106 mmol/L (ref 98–107)
CREATININE: 2.12 mg/dL — ABNORMAL HIGH (ref 0.70–1.30)
EGFR MDRD AF AMER: 38 mL/min/{1.73_m2} — ABNORMAL LOW (ref >=60)
EGFR MDRD NON AF AMER: 32 mL/min/{1.73_m2} — ABNORMAL LOW (ref >=60)
GLUCOSE RANDOM: 103 mg/dL — ABNORMAL HIGH (ref 65–99)
POTASSIUM: 4.5 mmol/L (ref 3.5–5.0)
PROTEIN TOTAL: 6.1 g/dL — ABNORMAL LOW (ref 6.5–8.3)
SODIUM: 143 mmol/L (ref 135–145)

## 2017-12-17 LAB — CBC W/ AUTO DIFF
EOSINOPHILS ABSOLUTE COUNT: 0.2 10*9/L (ref 0.0–0.4)
HEMATOCRIT: 39.7 % — ABNORMAL LOW (ref 41.0–53.0)
HEMOGLOBIN: 11.9 g/dL — ABNORMAL LOW (ref 13.5–17.5)
LARGE UNSTAINED CELLS: 2 % (ref 0–4)
LYMPHOCYTES ABSOLUTE COUNT: 1.3 10*9/L — ABNORMAL LOW (ref 1.5–5.0)
MEAN CORPUSCULAR HEMOGLOBIN: 27.6 pg (ref 26.0–34.0)
MEAN CORPUSCULAR VOLUME: 92.2 fL (ref 80.0–100.0)
MONOCYTES ABSOLUTE COUNT: 0.5 10*9/L (ref 0.2–0.8)
NEUTROPHILS ABSOLUTE COUNT: 4.2 10*9/L (ref 2.0–7.5)
PLATELET COUNT: 131 10*9/L — ABNORMAL LOW (ref 150–440)
RED BLOOD CELL COUNT: 4.31 10*12/L — ABNORMAL LOW (ref 4.50–5.90)
RED CELL DISTRIBUTION WIDTH: 14.8 % (ref 12.0–15.0)
WBC ADJUSTED: 6.3 10*9/L (ref 4.5–11.0)

## 2017-12-17 LAB — CMV DNA, QUANTITATIVE, PCR: CMV VIRAL LD: NOT DETECTED

## 2017-12-17 LAB — SLIDE REVIEW

## 2017-12-17 LAB — PROTEIN/CREAT RATIO, URINE: Protein/Creatinine:MRto:Pt:Urine:Qn:: 1.417

## 2017-12-17 LAB — BURR CELLS

## 2017-12-17 LAB — THYROID STIMULATING HORMONE: Thyrotropin:ACnc:Pt:Ser/Plas:Qn:: 3.78 — ABNORMAL HIGH

## 2017-12-17 LAB — MAGNESIUM: Magnesium:MCnc:Pt:Ser/Plas:Qn:: 1.2 — ABNORMAL LOW

## 2017-12-17 LAB — PHOSPHORUS: Phosphate:MCnc:Pt:Ser/Plas:Qn:: 3.4

## 2017-12-17 LAB — WBC ADJUSTED: Lab: 6.3

## 2017-12-17 LAB — CMV VIRAL LD: Lab: NOT DETECTED

## 2017-12-17 LAB — PROTEIN / CREATININE RATIO, URINE: CREATININE, URINE: 97.4 mg/dL

## 2017-12-17 LAB — SPECIFIC GRAVITY UA: Lab: 1.014

## 2017-12-17 LAB — POTASSIUM: Potassium:SCnc:Pt:Ser/Plas:Qn:: 4.5

## 2017-12-17 LAB — TACROLIMUS, TROUGH: Lab: 11.5

## 2017-12-17 NOTE — Unmapped (Signed)
Met with pt in nephrology clinic today.  His main concern today deals with bilateral ankle swelling which is new.  He noticed it about a month ago and reports that it is gone when he gets up in the morning.  He is not checking BP at home.  He denies SOB, CP.  He denies fever, cough, N/V/D or dysuria.  He held prograf prior to blood draw today, last dose was at 2300 last noc.

## 2017-12-21 NOTE — Unmapped (Signed)
Patient does not need a refill of specialty medication at this time. Moving specialty refill reminder call to appropriate date and removed call attempt data. Spoke with patient and he states he has over 2 weeks worth of medication.

## 2017-12-23 ENCOUNTER — Ambulatory Visit
Admission: EM | Admit: 2017-12-23 | Discharge: 2017-12-23 | Disposition: A | Discharge status: Home / Self Care | Payer: Medicare Other | Attending: Family Medicine | Admitting: Family Medicine

## 2017-12-23 ENCOUNTER — Encounter: Payer: Self-pay | Admitting: *Deleted

## 2017-12-23 DIAGNOSIS — J0100 Acute maxillary sinusitis, unspecified: Secondary | ICD-10-CM

## 2017-12-23 DIAGNOSIS — J069 Acute upper respiratory infection, unspecified: Secondary | ICD-10-CM | POA: Diagnosis not present

## 2017-12-23 DIAGNOSIS — R05 Cough: Secondary | ICD-10-CM

## 2017-12-23 DIAGNOSIS — J029 Acute pharyngitis, unspecified: Secondary | ICD-10-CM

## 2017-12-23 LAB — RAPID STREP SCREEN (MED CTR MEBANE ONLY): STREPTOCOCCUS, GROUP A SCREEN (DIRECT): NEGATIVE

## 2017-12-23 MED ORDER — ACYCLOVIR 400 MG TABLET
ORAL_TABLET | Freq: Every day | ORAL | 3 refills | 0.00000 days | Status: CP
Start: 2017-12-23 — End: 2018-10-06

## 2017-12-23 MED ORDER — DOXYCYCLINE HYCLATE 100 MG PO CAPS: 100.0000 mg | ORAL_CAPSULE | Freq: Two times a day (BID) | ORAL | 0 refills | Status: DC

## 2017-12-23 NOTE — Unmapped (Signed)
Per order Dr. Carlene Coria, pt needs biopsy for persistent and worsening proteinuria.  Pt informed and is agreeable with plan. Biopsy scheduled for 12/30/17.  Pt instructed to hold aspirin.  He was informed to be NPO after MN the night before, to arrive at registration at 0700, and to have a driver with him who will stay for the day.  Pt voiced understanding.

## 2017-12-23 NOTE — Unmapped (Signed)
US Renal Transplant Biopsy Note  Date of Scheduled Biopsy: 12/30/17  Rush?: No  Patient Name: Miguel Ward  MR: [id number]  Age: 67 y.o.  Gender: Male  Race: African American  Procedures: Ultrasound Guided Percutaneous Transplant Kidney Biopsy under Moderate Sedation  Tissue Submitted: Kidney  Special Studies Required: LM, IF, EM  ----------------------------------------------------------------------------------------------------------------------  Date of allograft implantation: 06/11/07  Underlying native kidney disease: SLE  Was the diagnosis established by biopsy? unk   Previous transplant biopsies? yes   If yes, what were the previous diagnoses?   Previous kidney transplants: no If yes, this is #:   History/Clinical Diagnosis/Indication for Biopsy: proteinuria  ----------------------------------------------------------------------------------------------------------------------  Current Baseline Immunosuppression: Steroids, MMF/Cellcept and FK506/Tacrolimus  Specific anti-rejection treatment before biopsy: no   If yes, what was the type of treatment?   Patient off immunosuppression?: no   Patient seems compliant? yes   Patient is currently back on hemodialysis? no   ----------------------------------------------------------------------------------------------------------------------  Evidence of allo-antibodies? no   If yes, HLA type/MFI?:   Blood Pressure (mmHg):   BP Readings from Last 3 Encounters:   12/17/17 152/80   06/18/17 146/94   02/17/17 142/94     Urinalysis:   Lab Results   Component Value Date    COLORU Yellow 12/17/2017    COLORU Yellow 01/22/2015    SPECGRAV 1.014 12/17/2017    SPECGRAV 1.015 01/22/2015    PHUR 6.5 12/17/2017    PHUR 5.0 01/22/2015    GLUCOSEU Negative 12/17/2017    GLUCOSEU NEGATIVE 01/22/2015    KETONESU Negative 12/17/2017    KETONESU NEGATIVE 01/22/2015    BLOODU Trace (A) 12/17/2017    BLOODU NEGATIVE 01/22/2015    NITRITE Negative 12/17/2017    NITRITE NEGATIVE 01/22/2015    LEUKOCYTESUR Negative 12/17/2017    LEUKOCYTESUR NEGATIVE 01/22/2015    UROBILINOGEN 0.2 mg/dL 29/52/8413    UROBILINOGEN 1 01/22/2015    BILIRUBINUR Negative 12/17/2017    BILIRUBINUR NEGATIVE 01/22/2015    WBCU <1 01/22/2015    RBCU 1 09/25/2014     Urine protein/creatinine ratio: 1.7  Creatinine (present peak): 2.1  Creatinine (baseline): 2.1  Lab Results   Component Value Date    CREATININE 2.12 (H) 12/17/2017    CREATININE 2.18 (H) 12/02/2017    CREATININE 2.10 (H) 06/18/2017    CREATININE 2.0 (H) 04/27/2017    CREATININE 2.02 (H) 02/17/2017     ----------------------------------------------------------------------------------------------------------------------  Clinical signs of infections at time of current biopsy:  BK: no  BK blood viral load:   BK urine viral load:   CMV: no  CMV viral load:   Herpes: no   Hepatitis B: no   Hepatitis C: no   Bacteria: no   Fungi: no   Urinary tract infection: no   Other:   ----------------------------------------------------------------------------------------------------------------------  Stenosis of renal artery: no   Obstruction of ureter: no   Lymphocele: no   ----------------------------------------------------------------------------------------------------------------------  Donor Information (if available)  Age of donor:   Gender:   Race:   Type of donor: Cadaveric  Ischemia time:   Delayed graft function: no   If yes, how many days on hemodialysis:     4210 SURGICAL PATHOLOGY REQUEST FORM  DATE      [facility] Test#  CPT Code Description   4250  88331  FROZEN SECTION - SINGLE   4251  88332  FROZEN SECTIO - ADDITIONAL         4227  88300  GROSS ONLY (NO SECTION)   4228  [phone]  LEVEL II - GROSS & MICRO EXAM   4229  [id number]  LEVEL III - GROSS & MICRO EXAM   4230  88305  LEVEL IV - GROSS & MICRO EXAM   4231  88307  LEVEL V - GROSS & MICRO EXAM   4232  88309  LEVEL VI - GROSS & MICRO EXAM   4233  88311  DECALCIFICATION   4234  88321  CONSULT ON REFERRED SLIDES 4235  88323  CONSULT WITH SLIDE PREP   4236  88329  CONSULT DURING SURGERY   4237  [phone]  BONE MARROW ASPIRATION     DEPT. USE ONLY     CODE QTY MISCELLANEOUS (SPECIFY) AMOUNT

## 2017-12-23 NOTE — ED Provider Notes (Addendum)
[facility] ____________________________________________  Time seen: Approximately 12:10 PM  I have reviewed the triage vital signs and the nursing notes.   HISTORY  Chief Complaint Cough; Sore Throat; and Nasal Congestion   HPI Clayton Larsen is a 67 y.o. male on chronic immunosuppression post history of kidney transplant in 2008 presenting for evaluation of runny nose, nasal congestion, sinus pressure and some cough that is been present for approximately 5 days.  No ibuprofen or Tylenol taken today or in the last few days.  Denies any known fevers, chills, body aches.  Has not tried any over-the-counter medications for the same complaints.  States he is having sinus pressure in his cheeks and forehead and having a lot of nasal drainage.  States nasal drainage is whitish.  States cough is more so at night and intermittently productive.  States sore throat is worse in the morning and improves with gargling Listerine.  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  Reports otherwise feels well.  Denies recent sickness. Denies chest pain, shortness of breath, abdominal pain, dysuria, or rash. Denies recent sickness. Denies recent antibiotic use.   True, Isabella Bowens, MD: PCP   Past Medical History:  Diagnosis Date  . Hypertension   . Kidney dialysis status    in 2000s stopped in 2008 after transplant   . Kidney transplanted 2008  . Renal disorder   . Thyroid disease   renal insufficiency.  There are no active problems to display for this patient.   Past Surgical History:  Procedure Laterality Date  . NEPHRECTOMY TRANSPLANTED ORGAN    . R arm fistula       No current facility-administered medications for this encounter.   Current Outpatient Medications:  .  allopurinol (ZYLOPRIM) 100 MG tablet, Take 150 mg by mouth daily., Disp: , Rfl:  .  aspirin 81 MG tablet, Take 81 mg by mouth daily., Disp: , Rfl:  .   atorvastatin (LIPITOR) 20 MG tablet, Take 20 mg by mouth daily., Disp: , Rfl:  .  calcitRIOL (ROCALTROL) 0.25 MCG capsule, Take 0.25 mcg by mouth 3 (three) times a week., Disp: , Rfl:  .  carboxymethylcellulose 1 % ophthalmic solution, Apply 1 drop to eye 3 (three) times daily., Disp: 30 mL, Rfl: 12 .  enalapril (VASOTEC) 5 MG tablet, Take 5 mg by mouth 2 (two) times daily., Disp: , Rfl:  .  ferrous sulfate 325 (65 FE) MG EC tablet, Take 325 mg by mouth 3 (three) times daily with meals., Disp: , Rfl:  .  furosemide (LASIX) 20 MG tablet, Take 20 mg by mouth., Disp: , Rfl:  .  levothyroxine (SYNTHROID, LEVOTHROID) 200 MCG tablet, Take 200 mcg by mouth daily before breakfast., Disp: , Rfl:  .  metoprolol succinate (TOPROL-XL) 100 MG 24 hr tablet, Take 100 mg by mouth daily. Take with or immediately following a meal., Disp: , Rfl:  .  mycophenolate (CELLCEPT) 500 MG tablet, Take by mouth 2 (two) times daily., Disp: , Rfl:  .  omeprazole (PRILOSEC) 40 MG capsule, Take 20 mg by mouth daily. , Disp: , Rfl:  .  predniSONE (DELTASONE) 5 MG tablet, Take 5 mg by mouth daily with breakfast., Disp: , Rfl:  .  tacrolimus (PROGRAF) 1 MG capsule, Take 3 mg by mouth 2 (two) times daily., Disp: , Rfl:  .  doxycycline (VIBRAMYCIN) 100 MG capsule, Take 1 capsule (100 mg total) by mouth 2 (  two) times daily., Disp: 20 capsule, Rfl: 0 .  fluticasone (FLONASE) 50 MCG/ACT nasal spray, Place 1 spray into both nostrils 2 (two) times daily., Disp: 16 g, Rfl: 0 .  sodium chloride (OCEAN) 0.65 % SOLN nasal spray, Place 2 sprays into both nostrils every 2 (two) hours while awake., Disp: , Rfl: 0  Allergies Penicillins  Family History  Problem Relation Age of Onset  . Diabetes Mother   . Vasculitis Mother   . Hypertension Mother   . Diabetes Father   . Cancer Father     Social History Social History   Tobacco Use  . Smoking status: Never Smoker  . Smokeless tobacco: Never Used  Substance Use Topics  . Alcohol  use: No  . Drug use: No    Review of Systems Constitutional: No fever/chills ENT: Positive sore throat. Cardiovascular: Denies chest pain. Respiratory: Denies shortness of breath. Gastrointestinal: No abdominal pain.  No nausea, no vomiting.  No diarrhea.   Genitourinary: Negative for dysuria. Musculoskeletal: Negative for back pain. Skin: Negative for rash. Neurological: Negative for focal weakness or numbness.  __________________________________________   PHYSICAL EXAM:  VITAL SIGNS: ED Triage Vitals  Enc Vitals Group     BP 12/23/17 1141 128/78     Pulse Rate 12/23/17 1141 75     Resp 12/23/17 1141 16     Temp 12/23/17 1141 97.9 F (36.6 C)     Temp Source 12/23/17 1141 Oral     SpO2 12/23/17 1141 100 %     Weight 12/23/17 1144 175 lb (79.4 kg)     Height 12/23/17 1144 6' (1.829 m)     Head Circumference --      Peak Flow --      Pain Score --      Pain Loc --      Pain Edu? --      Excl. in [place]? --     Constitutional: Alert and oriented. Well appearing and in no acute distress. Eyes: Conjunctivae are normal. . Head: Atraumatic.Mild tenderness to palpation bilateral maxillary sinuses. Nontender frontal sinuses. No swelling. No erythema.   Ears: no erythema, normal TMs bilaterally.   Nose: nasal congestion with bilateral nasal turbinate erythema and edema.   Mouth/Throat: Mucous membranes are moist. Mild pharyngeal erythema.  No tonsillar swelling or exudate.  Neck: No stridor. No cervical spine tenderness to palpation. Hematological/Lymphatic/Immunilogical: No cervical lymphadenopathy. Cardiovascular: Normal rate, regular rhythm. Grossly normal heart sounds.  Good peripheral circulation. Respiratory: Normal respiratory effort.  No retractions. No wheezes, rales or rhonchi. Good air movement.  Musculoskeletal: Steady gait.  Neurologic:  Normal speech and language. No gait instability. Skin:  Skin is warm, dry and intact. No rash noted. Psychiatric: Mood and affect  are normal. Speech and behavior are normal.   ___________________________________________   LABS (all labs ordered are listed, but only abnormal results are displayed)  Labs Reviewed  RAPID STREP SCREEN (NOT AT [facility])  CULTURE, GROUP A STREP [facility])    PROCEDURES Procedures   INITIAL IMPRESSION / ASSESSMENT AND PLAN / ED COURSE  Pertinent labs & imaging results that were available during my care of the patient were reviewed by me and considered in my medical decision making (see chart for details).  Well-appearing patient.  No acute distress.  Suspect viral upper respiratory infection with some viral sinusitis complaints.  However as patient chronic immunosuppression concern for secondary infection.  Patient states he has some cough and congestion medication at home to take as needed,  encourage this use rest, fluids and supportive care.  Discussed if symptoms persist in 2 more days without improvement then to start oral doxycycline, Rx given.  Discussed strict follow-up and return parameters.  Discussed prompt reevaluation for any fevers or worsening complaints.Discussed indication, risks and benefits of medications with patient.  Discussed follow up with Primary care physician this week. Discussed follow up and return parameters including no resolution or any worsening concerns. Patient verbalized understanding and agreed to plan.   ____________________________________________   FINAL CLINICAL IMPRESSION(S) / ED DIAGNOSES  Final diagnoses:  Upper respiratory tract infection, unspecified type  Acute maxillary sinusitis, recurrence not specified     ED Discharge Orders        Ordered    doxycycline (VIBRAMYCIN) 100 MG capsule  2 times daily     12/23/17 1226       Note: This dictation was prepared with Dragon dictation along with smaller phrase technology. Any transcriptional errors that result from this process are unintentional.           Marylene Land, NP 12/23/17  1238

## 2017-12-23 NOTE — ED Triage Notes (Signed)
Productive cough- clear, runny nose, sore throat, head congestion since Sunday.

## 2017-12-23 NOTE — Discharge Instructions (Signed)
Take medication as discussed and prescribed.  Rest.  Drink plenty of fluids.  Closely monitor yourself.  Follow-up with your primary care this week as needed.  Follow up promptly for any fever, worsening concerns.  Return to the urgent care as needed.

## 2017-12-24 MED ORDER — ATORVASTATIN 20 MG TABLET
ORAL_TABLET | Freq: Every day | ORAL | 3 refills | 0 days | Status: CP
Start: 2017-12-24 — End: 2018-03-31

## 2017-12-24 MED ORDER — FUROSEMIDE 40 MG TABLET
ORAL_TABLET | Freq: Every day | ORAL | 3 refills | 0 days | Status: SS
Start: 2017-12-24 — End: 2018-02-14

## 2017-12-26 LAB — CULTURE, GROUP A STREP (THRC)

## 2017-12-28 NOTE — Unmapped (Signed)
[facility] Refill and Clinical Coordination Note  Medication(s): PROGRAF    Miguel Ward, DOB: Feb 25, 1951  Phone: [phone] (home) , Alternate phone contact: N/A  Shipping address: [address]  [facility] [place] [address]  Phone or address changes today?: No  All above HIPAA information verified.  Insurance changes? No    Completed refill and clinical call assessment today to schedule patient's medication shipment from the [facility] [phone]).      MEDICATION RECONCILIATION    Confirmed the medication and dosage are correct and have not changed: Yes, regimen is correct and unchanged.    Were there any changes to your medication(s) in the past month:  No, there are no changes reported at this time.    ADHERENCE    Is this medicine transplant or covered by Medicare Part B? No.    Prograf 1 mg   Quantity filled last month: 180   # of tablets left on hand: 11 DAYS LEFT      Did you miss any doses in the past 4 weeks? No missed doses reported.  Adherence counseling provided? Not needed     SIDE EFFECT MANAGEMENT    Are you tolerating your medication?:  Miguel Ward reports tolerating the medication.  Side effect management discussed: None      Therapy is appropriate and should be continued.    Evidence of clinical benefit: See Epic note from 02/17/17      FINANCIAL/SHIPPING    Delivery Scheduled: Yes, Expected medication delivery date: 01/06/18   Additional medications refilled: No additional medications/refills needed at this time.    Arby did not have any additional questions at this time.    Delivery address validated in [facility] scheduling system: Yes, address listed above is correct.      We will follow up with patient monthly for standard refill processing and delivery.      Thank you,  [name]   [facility] Specialty Pharmacist

## 2017-12-30 ENCOUNTER — Ambulatory Visit: Admit: 2017-12-30 | Discharge: 2017-12-31 | Payer: MEDICARE

## 2017-12-30 DIAGNOSIS — N179 Acute kidney failure, unspecified: Principal | ICD-10-CM

## 2017-12-30 LAB — BURR CELLS

## 2017-12-30 LAB — MEAN CORPUSCULAR HEMOGLOBIN: Lab: 27.8

## 2017-12-30 LAB — CBC
HEMATOCRIT: 36 % — ABNORMAL LOW (ref 41.0–53.0)
HEMOGLOBIN: 10.9 g/dL — ABNORMAL LOW (ref 13.5–17.5)
MEAN CORPUSCULAR HEMOGLOBIN CONC: 30.2 g/dL — ABNORMAL LOW (ref 31.0–37.0)
MEAN CORPUSCULAR HEMOGLOBIN: 27.8 pg (ref 26.0–34.0)
MEAN CORPUSCULAR VOLUME: 92 fL (ref 80.0–100.0)
MEAN PLATELET VOLUME: 9.5 fL (ref 7.0–10.0)
PLATELET COUNT: 108 10*9/L — ABNORMAL LOW (ref 150–440)
RED CELL DISTRIBUTION WIDTH: 14.7 % (ref 12.0–15.0)

## 2017-12-30 LAB — SODIUM: Sodium:SCnc:Pt:Ser/Plas:Qn:: 144

## 2017-12-30 LAB — CBC W/ AUTO DIFF
BASOPHILS ABSOLUTE COUNT: 0 10*9/L (ref 0.0–0.1)
HEMATOCRIT: 40.6 % — ABNORMAL LOW (ref 41.0–53.0)
HEMOGLOBIN: 12.3 g/dL — ABNORMAL LOW (ref 13.5–17.5)
LARGE UNSTAINED CELLS: 2 % (ref 0–4)
LYMPHOCYTES ABSOLUTE COUNT: 1.2 10*9/L — ABNORMAL LOW (ref 1.5–5.0)
MEAN CORPUSCULAR HEMOGLOBIN CONC: 30.3 g/dL — ABNORMAL LOW (ref 31.0–37.0)
MEAN CORPUSCULAR HEMOGLOBIN: 28.1 pg (ref 26.0–34.0)
MEAN CORPUSCULAR VOLUME: 92.9 fL (ref 80.0–100.0)
MEAN PLATELET VOLUME: 9.7 fL (ref 7.0–10.0)
NEUTROPHILS ABSOLUTE COUNT: 3.2 10*9/L (ref 2.0–7.5)
PLATELET COUNT: 130 10*9/L — ABNORMAL LOW (ref 150–440)
RED CELL DISTRIBUTION WIDTH: 14.8 % (ref 12.0–15.0)
WBC ADJUSTED: 5.1 10*9/L (ref 4.5–11.0)

## 2017-12-30 LAB — BASIC METABOLIC PANEL
ANION GAP: 10 mmol/L (ref 9–15)
BLOOD UREA NITROGEN: 44 mg/dL — ABNORMAL HIGH (ref 7–21)
BUN / CREAT RATIO: 20
CALCIUM: 9.7 mg/dL (ref 8.5–10.2)
CHLORIDE: 107 mmol/L (ref 98–107)
CREATININE: 2.2 mg/dL — ABNORMAL HIGH (ref 0.70–1.30)
EGFR MDRD NON AF AMER: 30 mL/min/{1.73_m2} — ABNORMAL LOW (ref >=60)
GLUCOSE RANDOM: 91 mg/dL (ref 65–179)
POTASSIUM: 4.5 mmol/L (ref 3.5–5.0)
SODIUM: 144 mmol/L (ref 135–145)

## 2017-12-30 LAB — INR: Lab: 1.21

## 2017-12-30 LAB — MEAN CORPUSCULAR VOLUME: Lab: 92.9

## 2017-12-30 LAB — HEPARIN CORRELATION: Lab: <0.2

## 2017-12-30 NOTE — Unmapped (Signed)
Assessment/Plan:    Miguel Ward is a 67 y.o. male who will undergo Ultrasound guided transplant kidney biopsy    1. Indications and risks/benefits of procedure reviewed with patient.    2. Consent signed and present on patient's charge.   3. No cardiopulmonary or other medical contraindications present therefore will proceed with biopsy.       CC: No chief complaint on file.      HPI: Miguel Ward is a 67 y.o. male who will undergo  Ultrasound guided transplant kidney biopsy with moderate sedation.    Allergies:   Allergies   Allergen Reactions   ??? Penicillins Hives     Other reaction(s): HIVES       Medications:   Current Outpatient Prescriptions   Medication Sig Dispense Refill   ??? acyclovir (ZOVIRAX) 400 MG tablet Take 1 tablet (400 mg total) by mouth daily. 90 tablet 3   ??? allopurinol (ZYLOPRIM) 100 MG tablet Take 1.5 tablets (150 mg total) by mouth daily. 135 tablet 3   ??? aspirin (ASPIRIN LOW-STRENGTH) 81 MG chewable tablet Chew 81 mg daily.     ??? atorvastatin (LIPITOR) 20 MG tablet Take 1 tablet (20 mg total) by mouth daily. 90 tablet 3   ??? calcitriol (ROCALTROL) 0.25 MCG capsule Take 1 capsule (0.25 mcg total) by mouth Every Monday, Wednesday, and Friday. 36 capsule 3   ??? CELLCEPT 250 mg capsule Take 1 capsule (250 mg total) by mouth Two (2) times a day. 60 capsule 11   ??? colchicine (COLCRYS) 0.6 mg tablet Take 1 tablet (0.6 mg total) by mouth daily as needed. for up to 1 dose 30 tablet 11   ??? doxycycline (ADOXA) 100 MG tablet Take 100 mg by mouth daily.     ??? enalapril (VASOTEC) 5 MG tablet Take 1 tablet (5 mg total) by mouth Two (2) times a day. 60 tablet 11   ??? ferrous sulfate 325 (65 FE) MG tablet Take 325 mg by mouth 3 (three) times a day.     ??? fluticasone (FLONASE) 50 mcg/actuation nasal spray 1 spray into each nostril daily as needed.     ??? furosemide (LASIX) 40 MG tablet Take 0.5 tablets (20 mg total) by mouth daily. 45 tablet 3   ??? levothyroxine (SYNTHROID) 200 MCG tablet Take 1 tab with one tab, total dose daily 90 tablet 3   ??? levothyroxine (SYNTHROID, LEVOTHROID) 25 MCG tablet Take 1 tab with one tab, total dose daily 90 tablet 3   ??? metoprolol succinate (TOPROL-XL) 100 MG 24 hr tablet Take 1 tablet (100 mg total) by mouth daily. 90 tablet 3   ??? omeprazole (PRILOSEC) 20 MG capsule Take 1 capsule (20 mg total) by mouth daily. 90 capsule 3   ??? predniSONE (DELTASONE) 5 MG tablet Take 1 tablet (5 mg total) by mouth daily. 90 tablet 3   ??? PROGRAF 1 mg capsule Take 3 capsules (3 mg total) by mouth Two (2) times a day. 180 capsule 11   ??? simethicone (GAS-X) 80 MG chewable tablet Chew 1 tablet (80 mg total) every six (6) hours as needed for flatulence. 30 tablet 1   ??? sodium bicarbonate 650 mg tablet Take 1 tablet (650 mg total) by mouth Two (2) times a day. 180 tablet 3     No current facility-administered medications for this encounter.        PMH:   Past Medical History:   Diagnosis Date   ??? Coronary artery  disease    ??? Disease of thyroid gland    ??? Gout    ??? Hyperlipidemia    ??? Hypertension    ??? Lupus nephritis (CMS-HCC)    ??? Red blood cell antibody positive     anti-K   ??? Renal cancer, right (CMS-HCC)    ??? Renal transplant, status post        ASA Grade: ASA 2 - Patient with mild systemic disease with no functional limitations    ROS:  General: Denies fever or chills.  Cardiovascular: Denies chest pain.   Pulmonary: Denies shortness of breath, snoring, sleep apnea, or respiratory infection.    Allergies:   Allergies   Allergen Reactions   ??? Penicillins Hives     Other reaction(s): HIVES           PE:    Vitals:    12/30/17 0834   BP: 162/86   Pulse: 74   Temp: 36.8 ??C   SpO2: 96%     General:  black male in NAD.  Airway assessment: Class 2 - Can visualize soft palate and fauces, tip of uvula is obscured  Cardiovascular:  Regular rate and rhythm.  No murmurs, gallops or rubs.   Lungs: respirations nonlabored; clear to auscultation bilaterally

## 2017-12-30 NOTE — Unmapped (Signed)
KIDNEY BIOPSY PROCEDURE NOTE    INDICATIONS:  Proteinuria and Assess for rejection     CONSENT/TIME OUT:    Risks, benefits and alternatives including blood loss requiring transfusion, loss of kidney or kidney function, and death were discussed with patient.  Written informed consent was obtained prior to the procedure and is detailed in the medical record.  Prior to the start of the procedure, a time out was taken and the identity of the patient was confirmed via name, medical record number and date of birth.  The availability of the correct equipment was verified.    PROCEDURE:  Name: Transplant Kidney Biopsy  Description: Ultrasound guided transplant kidney biopsy     Pre-Procedure blood specimens were sent for CBC, PT/aPTT, and type & screen.     The patient was given 1 mg of midazolam and 25 mcg of fentanyl during the procedure, and 15 mL lidocaine 1% were used for local anesthesia. Under ultrasound guidance, a 16cm 16G biopsy needle was inserted into the transplanted kidney for a total of 3 passes with 3 core specimens obtained for analysis.      COMPLICATIONS:   Complications: None; no hematoma or active bleeding visualized    The patient will be closely watched in the recovery area, kept flat for 6 hours while wearing an abdominal binder, and will have a repeat CBC and ultrasound in 4 hours to insure no hemodynamically significant bleeding post-biopsy.    SPECIMEN(S):  Core #1: 1.8 x 0.1 x 0.1 (IF 0.3, LM 1.5)  Core #2: 1.3 x 0.1 x 0.1 (LM 1.3)  Core #3: 1.9 x 0.1 x 0.1 (EM 0.2, LM 1.7)

## 2018-01-04 MED FILL — PROGRAF/1MG/CAP: PROGRAF/1MG/CAP | 30 days supply | Qty: 180 | Fill #3

## 2018-01-07 MED ORDER — SIMETHICONE 80 MG CHEWABLE TABLET
ORAL_TABLET | Freq: Four times a day (QID) | ORAL | 1 refills | 0.00000 days | Status: CP | PRN
Start: 2018-01-07 — End: 2018-01-28

## 2018-01-29 MED ORDER — ALLOPURINOL 100 MG TABLET
ORAL_TABLET | Freq: Every day | ORAL | 3 refills | 0 days | Status: CP
Start: 2018-01-29 — End: 2018-11-21

## 2018-01-29 MED ORDER — SIMETHICONE 80 MG CHEWABLE TABLET
ORAL_TABLET | Freq: Four times a day (QID) | ORAL | 1 refills | 0 days | Status: CP | PRN
Start: 2018-01-29 — End: ?

## 2018-01-31 NOTE — Unmapped (Signed)
[facility] Refill Coordination Note  Specialty Medication(s): Prograf 1mg     PRANISH AKHAVAN, DOB: 27-May-1951  Phone: [phone] (home) , Alternate phone contact: N/A  Phone or address changes today?: No  All above HIPAA information was verified with patient.  Shipping Address: [address]  [facility] [place] [address]   Insurance changes? No    Completed refill call assessment today to schedule patient's medication shipment from the [facility] [phone]).      Confirmed the medication and dosage are correct and have not changed: Yes, regimen is correct and unchanged.    Confirmed patient started or stopped the following medications in the past month:  No, there are no changes reported at this time.    Are you tolerating your medication?:  Ferd reports tolerating the medication.    ADHERENCE    (Below is required for Medicare Part B or Transplant patients only - per drug):   How many tablets were dispensed last month:     Prograf 1 mg   Quantity filled last month: 180   # of tablets left on hand: 10 DAYS    Did you miss any doses in the past 4 weeks? No missed doses reported.    FINANCIAL/SHIPPING    Delivery Scheduled: Yes, Expected medication delivery date: 02/09/2018     Hugo did not have any additional questions at this time.    Delivery address validated in [facility] scheduling system: Yes, address listed in [facility] is correct.    We will follow up with patient monthly for standard refill processing and delivery.      Thank you,  Tamala Fothergill   [facility] Specialty Technician

## 2018-02-02 MED ORDER — SODIUM BICARBONATE 650 MG TABLET
ORAL_TABLET | Freq: Two times a day (BID) | ORAL | 3 refills | 0.00000 days | Status: CP
Start: 2018-02-02 — End: 2018-10-21

## 2018-02-11 ENCOUNTER — Encounter: Payer: Self-pay | Admitting: Emergency Medicine

## 2018-02-11 ENCOUNTER — Ambulatory Visit
Admission: EM | Admit: 2018-02-11 | Discharge: 2018-02-11 | Disposition: A | Discharge status: Other Acute Inpatient Hospital | Payer: Medicare Other | Attending: Family Medicine | Admitting: Family Medicine

## 2018-02-11 ENCOUNTER — Ambulatory Visit (INDEPENDENT_AMBULATORY_CARE_PROVIDER_SITE_OTHER): Payer: Medicare Other

## 2018-02-11 ENCOUNTER — Other Ambulatory Visit: Payer: Self-pay

## 2018-02-11 ENCOUNTER — Ambulatory Visit
Admit: 2018-02-11 | Discharge: 2018-02-14 | Disposition: A | Discharge status: Home / Self Care | Payer: MEDICARE | Admitting: Nephrology

## 2018-02-11 DIAGNOSIS — C649 Malignant neoplasm of unspecified kidney, except renal pelvis: Principal | ICD-10-CM

## 2018-02-11 DIAGNOSIS — R0989 Other specified symptoms and signs involving the circulatory and respiratory systems: Secondary | ICD-10-CM

## 2018-02-11 DIAGNOSIS — J810 Acute pulmonary edema: Secondary | ICD-10-CM | POA: Diagnosis not present

## 2018-02-11 DIAGNOSIS — R600 Localized edema: Secondary | ICD-10-CM

## 2018-02-11 DIAGNOSIS — R062 Wheezing: Secondary | ICD-10-CM

## 2018-02-11 DIAGNOSIS — J181 Lobar pneumonia, unspecified organism: Secondary | ICD-10-CM | POA: Diagnosis not present

## 2018-02-11 DIAGNOSIS — R05 Cough: Secondary | ICD-10-CM | POA: Diagnosis not present

## 2018-02-11 DIAGNOSIS — J189 Pneumonia, unspecified organism: Secondary | ICD-10-CM

## 2018-02-11 LAB — RAPID INFLUENZA A&B ANTIGENS
Influenza A (ARMC): NEGATIVE
Influenza B (ARMC): NEGATIVE

## 2018-02-11 LAB — CBC WITH DIFFERENTIAL/PLATELET
Basophils Absolute: 0 10*3/uL (ref 0–0.1)
Basophils Relative: 0 %
EOS ABS: 0.2 10*3/uL (ref 0–0.7)
Eosinophils Relative: 6 %
HCT: 36.5 % — ABNORMAL LOW (ref 40.0–52.0)
HEMOGLOBIN: 11.6 g/dL — AB (ref 13.0–18.0)
LYMPHS ABS: 0.5 10*3/uL — AB (ref 1.0–3.6)
Lymphocytes Relative: 12 %
MCH: 28 pg (ref 26.0–34.0)
MCHC: 31.8 g/dL — ABNORMAL LOW (ref 32.0–36.0)
MCV: 88.1 fL (ref 80.0–100.0)
Monocytes Absolute: 0.7 10*3/uL (ref 0.2–1.0)
Monocytes Relative: 19 %
NEUTROS ABS: 2.4 10*3/uL (ref 1.4–6.5)
NEUTROS PCT: 63 %
Platelets: 75 10*3/uL — ABNORMAL LOW (ref 150–440)
RBC: 4.15 MIL/uL — AB (ref 4.40–5.90)
RDW: 15 % — ABNORMAL HIGH (ref 11.5–14.5)
WBC: 3.8 10*3/uL (ref 3.8–10.6)

## 2018-02-11 LAB — BASIC METABOLIC PANEL
ANION GAP: 11 (ref 5–15)
BUN: 38 mg/dL — ABNORMAL HIGH (ref 6–20)
CHLORIDE: 105 mmol/L (ref 101–111)
CO2: 24 mmol/L (ref 22–32)
Calcium: 8.7 mg/dL — ABNORMAL LOW (ref 8.9–10.3)
Creatinine, Ser: 2.46 mg/dL — ABNORMAL HIGH (ref 0.61–1.24)
GFR calc Af Amer: 30 mL/min — ABNORMAL LOW (ref >60)
GFR calc non Af Amer: 26 mL/min — ABNORMAL LOW (ref >60)
Glucose, Bld: 100 mg/dL — ABNORMAL HIGH (ref 65–99)
POTASSIUM: 3.6 mmol/L (ref 3.5–5.1)
SODIUM: 140 mmol/L (ref 135–145)

## 2018-02-11 LAB — CBC W/ AUTO DIFF
BASOPHILS ABSOLUTE COUNT: 0 10*9/L (ref 0.0–0.1)
BASOPHILS RELATIVE PERCENT: 0.2 %
EOSINOPHILS ABSOLUTE COUNT: 0.2 10*9/L (ref 0.0–0.4)
EOSINOPHILS RELATIVE PERCENT: 4.9 %
HEMATOCRIT: 38.8 % — ABNORMAL LOW (ref 41.0–53.0)
HEMOGLOBIN: 11.8 g/dL — ABNORMAL LOW (ref 13.5–17.5)
LARGE UNSTAINED CELLS: 5 % — ABNORMAL HIGH (ref 0–4)
LYMPHOCYTES ABSOLUTE COUNT: 0.3 10*9/L — ABNORMAL LOW (ref 1.5–5.0)
LYMPHOCYTES RELATIVE PERCENT: 8.6 %
MEAN CORPUSCULAR HEMOGLOBIN CONC: 30.3 g/dL — ABNORMAL LOW (ref 31.0–37.0)
MEAN CORPUSCULAR HEMOGLOBIN: 27.9 pg (ref 26.0–34.0)
MEAN CORPUSCULAR VOLUME: 92.2 fL (ref 80.0–100.0)
MONOCYTES ABSOLUTE COUNT: 0.4 10*9/L (ref 0.2–0.8)
MONOCYTES RELATIVE PERCENT: 9.6 %
NEUTROPHILS ABSOLUTE COUNT: 2.9 10*9/L (ref 2.0–7.5)
RED BLOOD CELL COUNT: 4.21 10*12/L — ABNORMAL LOW (ref 4.50–5.90)
RED CELL DISTRIBUTION WIDTH: 15.7 % — ABNORMAL HIGH (ref 12.0–15.0)
WBC ADJUSTED: 4 10*9/L — ABNORMAL LOW (ref 4.5–11.0)

## 2018-02-11 LAB — ALBUMIN: Albumin:MCnc:Pt:Ser/Plas:Qn:: 3.2 — ABNORMAL LOW

## 2018-02-11 LAB — COMPREHENSIVE METABOLIC PANEL
ALBUMIN: 3.2 g/dL — ABNORMAL LOW (ref 3.5–5.0)
ALKALINE PHOSPHATASE: 81 U/L (ref 38–126)
ALT (SGPT): 36 U/L (ref 19–72)
ANION GAP: 7 mmol/L — ABNORMAL LOW (ref 9–15)
AST (SGOT): 43 U/L (ref 19–55)
BILIRUBIN TOTAL: 1.2 mg/dL (ref 0.0–1.2)
BLOOD UREA NITROGEN: 37 mg/dL — ABNORMAL HIGH (ref 7–21)
BUN / CREAT RATIO: 15
CALCIUM: 9.7 mg/dL (ref 8.5–10.2)
CHLORIDE: 109 mmol/L — ABNORMAL HIGH (ref 98–107)
CO2: 25 mmol/L (ref 22.0–30.0)
CREATININE: 2.41 mg/dL — ABNORMAL HIGH (ref 0.70–1.30)
EGFR MDRD NON AF AMER: 27 mL/min/{1.73_m2} — ABNORMAL LOW (ref >=60)
GLUCOSE RANDOM: 102 mg/dL (ref 65–179)
POTASSIUM: 3.9 mmol/L (ref 3.5–5.0)
PROTEIN TOTAL: 6.4 g/dL — ABNORMAL LOW (ref 6.5–8.3)
SODIUM: 141 mmol/L (ref 135–145)

## 2018-02-11 LAB — TROPONIN I
Troponin I.cardiac:MCnc:Pt:Ser/Plas:Qn:: 0.12
Troponin I.cardiac:MCnc:Pt:Ser/Plas:Qn:: 0.18

## 2018-02-11 LAB — HEMOGLOBIN: Lab: 11.8 — ABNORMAL LOW

## 2018-02-11 LAB — PRO-BNP: Natriuretic peptide.B prohormone N-Terminal:MCnc:Pt:Ser/Plas:Qn:: 16400 — ABNORMAL HIGH

## 2018-02-11 MED ORDER — ALBUTEROL SULFATE (2.5 MG/3ML) 0.083% IN NEBU
2.5000 mg | INHALATION_SOLUTION | Freq: Once | RESPIRATORY_TRACT | Status: AC
  Administered 2018-02-11: 2.5 mg via RESPIRATORY_TRACT

## 2018-02-11 NOTE — Unmapped (Signed)
Pt with cough since Monday and difficulty breathing when laying down.  Sent from UC and Dx with right sided pneumonia and possible new onset of CHF.  Pt reports swelling to BLE.  Denies any CP.

## 2018-02-11 NOTE — ED Triage Notes (Signed)
Patient c/o nasal congestion and cough for the past week.  Patient denies fevers.

## 2018-02-11 NOTE — ED Notes (Signed)
Report called to charge nurse at [facility] ED

## 2018-02-11 NOTE — ED Provider Notes (Signed)
[facility] ____________________________________________  Time seen: Approximately 12:06 PM  I have reviewed the triage vital signs and the nursing notes.   HISTORY  Chief Complaint Nasal Congestion and Cough   HPI Clayton Larsen is a 67 y.o. male with history of hypertension, chronic immunosuppression due to renal transplant, presenting for evaluation of 5 days of cough and congestion symptoms.  Reports symptoms onset Sunday night into Monday, and have continued to worsen.  States cough seems to have a "rattle "particularly worse at night.  States has had some chills and body aches sensation, reports granddaughter checked his temperature last night and his temperature was 100.8.  No antipyretics taken today prior to arrival.  Has taken some intermittent over-the-counter Robitussin without much change.  States cough is occasionally productive of brownish greenish sputum.  Denies hemoptysis.  Denies accompanying chest pain or shortness of breath.  Recently sick in January with upper respiratory symptoms as well, but states it was not this level, and resolved with oral doxycycline.  Denies home sick contacts, but reports he was recently at a large convention this past weekend where he was exposed to a lot of different people.  Continues to eat and drink well.  Denies sinus pain or sore throat.  Patient reports for the last 4-6 months he has been having bilateral lower extremity swelling, and per patient his nephrology team has not yet figured out the cause.  Denies other aggravating or alleviating factors.  No recent medication changes.  States renal function has been similar to his chronic. Denies chest pain, shortness of breath, abdominal pain, dysuria, or rash. Denies recent sickness. Denies recent antibiotic use.   True, Isabella Bowens, MD: PCP Nephrology: [facility]   Past Medical History:  Diagnosis Date  . Hypertension   . Kidney dialysis status    in 2000s stopped in 2008 after  transplant   . Kidney transplanted 2008  . Renal disorder   . Thyroid disease     There are no active problems to display for this patient.   Past Surgical History:  Procedure Laterality Date  . NEPHRECTOMY TRANSPLANTED ORGAN    . R arm fistula       No current facility-administered medications for this encounter.   Current Outpatient Medications:  .  allopurinol (ZYLOPRIM) 100 MG tablet, Take 150 mg by mouth daily., Disp: , Rfl:  .  aspirin 81 MG tablet, Take 81 mg by mouth daily., Disp: , Rfl:  .  atorvastatin (LIPITOR) 20 MG tablet, Take 20 mg by mouth daily., Disp: , Rfl:  .  calcitRIOL (ROCALTROL) 0.25 MCG capsule, Take 0.25 mcg by mouth 3 (three) times a week., Disp: , Rfl:  .  carboxymethylcellulose 1 % ophthalmic solution, Apply 1 drop to eye 3 (three) times daily., Disp: 30 mL, Rfl: 12 .  doxycycline (VIBRAMYCIN) 100 MG capsule, Take 1 capsule (100 mg total) by mouth 2 (two) times daily., Disp: 20 capsule, Rfl: 0 .  enalapril (VASOTEC) 5 MG tablet, Take 5 mg by mouth 2 (two) times daily., Disp: , Rfl:  .  ferrous sulfate 325 (65 FE) MG EC tablet, Take 325 mg by mouth 3 (three) times daily with meals., Disp: , Rfl:  .  fluticasone (FLONASE) 50 MCG/ACT nasal spray, Place 1 spray into both nostrils 2 (two) times daily., Disp: 16 g, Rfl: 0 .  furosemide (LASIX) 20 MG tablet, Take 20 mg by mouth., Disp: , Rfl:  .  levothyroxine (SYNTHROID, LEVOTHROID) 200 MCG tablet, Take 200  mcg by mouth daily before breakfast., Disp: , Rfl:  .  metoprolol succinate (TOPROL-XL) 100 MG 24 hr tablet, Take 100 mg by mouth daily. Take with or immediately following a meal., Disp: , Rfl:  .  mycophenolate (CELLCEPT) 500 MG tablet, Take by mouth 2 (two) times daily., Disp: , Rfl:  .  omeprazole (PRILOSEC) 40 MG capsule, Take 20 mg by mouth daily. , Disp: , Rfl:  .  predniSONE (DELTASONE) 5 MG tablet, Take 5 mg by mouth daily with breakfast., Disp: , Rfl:  .  sodium chloride (OCEAN) 0.65 % SOLN nasal  spray, Place 2 sprays into both nostrils every 2 (two) hours while awake., Disp: , Rfl: 0 .  tacrolimus (PROGRAF) 1 MG capsule, Take 3 mg by mouth 2 (two) times daily., Disp: , Rfl:   Allergies Penicillins  Family History  Problem Relation Age of Onset  . Diabetes Mother   . Vasculitis Mother   . Hypertension Mother   . Diabetes Father   . Cancer Father     Social History Social History   Tobacco Use  . Smoking status: Never Smoker  . Smokeless tobacco: Never Used  Substance Use Topics  . Alcohol use: No  . Drug use: No    Review of Systems Constitutional: As above.  ENT: No sore throat. Cardiovascular: Denies chest pain. Respiratory: Denies shortness of breath. Gastrointestinal: No abdominal pain.   Musculoskeletal: Negative for back pain. Skin: Negative for rash.  ____________________________________________   PHYSICAL EXAM:  VITAL SIGNS: ED Triage Vitals  Enc Vitals Group     BP 02/11/18 1038 (!) 166/81     Pulse Rate 02/11/18 1038 90     Resp 02/11/18 1038 16     Temp 02/11/18 1038 98.8 F (37.1 C)     Temp Source 02/11/18 1038 Oral     SpO2 02/11/18 1038 96 %     Weight 02/11/18 1036 175 lb (79.4 kg)     Height 02/11/18 1036 6' (1.829 m)     Head Circumference --      Peak Flow --      Pain Score 02/11/18 1035 0     Pain Loc --      Pain Edu? --      Excl. in [place]? --    Constitutional: Alert and oriented. Well appearing and in no acute distress. Eyes: Conjunctivae are normal. PERRL. EOMI. Head: Atraumatic. No sinus tenderness to palpation. No swelling. No erythema.  Ears: no erythema, normal TMs bilaterally.   Nose:Nasal congestion  Mouth/Throat: Mucous membranes are moist. No pharyngeal erythema. No tonsillar swelling or exudate.  Neck: No stridor.  No cervical spine tenderness to palpation. Hematological/Lymphatic/Immunilogical: No cervical lymphadenopathy. Cardiovascular: Normal rate, regular rhythm. Grossly normal heart sounds.  Good  peripheral circulation. Respiratory: Normal respiratory effort.  No retractions.  Mild diffuse inspiratory and expiratory wheezes.  Coarse diffuse rhonchi.  Occasional dry cough noted in room.  Good air movement.  Speaks in complete sentences. Gastrointestinal: Soft and nontender.  Musculoskeletal: Ambulatory with steady gait. No cervical, thoracic or lumbar tenderness to palpation.  Bilateral lower extremities mid pretibial and distally 1-2+ pitting edema.  Neurologic:  Normal speech and language. No gait instability. Skin:  Skin appears warm, dry and intact. No rash noted. Psychiatric: Mood and affect are normal. Speech and behavior are normal.  ___________________________________________   LABS (all labs ordered are listed, but only abnormal results are displayed)  Labs Reviewed  BASIC METABOLIC PANEL - Abnormal; Notable for the  following components:      Result Value   Glucose, Bld 100 (*)    BUN 38 (*)    Creatinine, Ser 2.46 (*)    Calcium 8.7 (*)    GFR calc non Af Amer 26 (*)    GFR calc Af Amer 30 (*)    All other components within normal limits  CBC WITH DIFFERENTIAL/PLATELET - Abnormal; Notable for the following components:   RBC 4.15 (*)    Hemoglobin 11.6 (*)    HCT 36.5 (*)    MCHC 31.8 (*)    RDW 15.0 (*)    Platelets 75 (*)    Lymphs Abs 0.5 (*)    All other components within normal limits  RAPID INFLUENZA A&B ANTIGENS ([facility] ONLY)   ____________________________________________  RADIOLOGY  Dg Chest 2 View  Result Date: 02/11/2018 CLINICAL DATA:  Cough and congestion. EXAM: CHEST  2 VIEW COMPARISON:  01/10/2013 FINDINGS: Cardiac enlargement stable. Pulmonary vascular congestion noted. No pleural effusion or pulmonary edema. Subtle perihilar opacity within the right upper lobe is identified and appears new from previous exam. Left lung is clear. IMPRESSION: 1. Subtle right upper lobe perihilar opacity which may reflect pneumonia. Followup PA and lateral chest X-ray  is recommended in 3-4 weeks following trial of antibiotic therapy to ensure resolution and exclude underlying malignancy. 2. Cardiac enlargement and pulmonary vascular congestion Electronically Signed   By: Kerby Moors M.D.   On: 02/11/2018 12:59   ____________________________________________   PROCEDURES Procedures    INITIAL IMPRESSION / ASSESSMENT AND PLAN / ED COURSE  Pertinent labs & imaging results that were available during my care of the patient were reviewed by me and considered in my medical decision making (see chart for details).  Overall stable appearing patient.  Chronic immunosuppression secondary to renal transplant.  Presenting for symptoms suggestive of viral URI, concern for secondary pneumonia.  Chest x-ray subtle right upper lobe perihilar opacity possible pneumonia per radiologist.  Also radiologist noted cardiac enlargement and pulmonary vascular congestion on chest x-ray.  Patient with peripheral edema.  Suspect new onset CHF with right upper pneumonia.  Unable to have BNP promptly evaluated in urgent care, recommend for further evaluation in emergency room for further laboratory and cardiac enzymes as patient is immunocompromised.  Patient agrees to this plan.  Patient was given 1 albuterol neb in urgent care without much change if wheezing and rhonchi.  Oxygen saturation did not decrease with ambulation or at rest.  Patient states that he feels okay.  Patient states that he will take himself to [facility] emergency room, [place] RN called and given report.  Patient stable at time of discharge.   ____________________________________________   FINAL CLINICAL IMPRESSION(S) / ED DIAGNOSES  Final diagnoses:  Pneumonia of right upper lobe due to infectious organism [facility])  Pulmonary vascular congestion  Extremity edema     ED Discharge Orders    None       Note: This dictation was prepared with Dragon dictation along with smaller phrase technology. Any  transcriptional errors that result from this process are unintentional.         Marylene Land, NP 02/11/18 1457

## 2018-02-11 NOTE — Discharge Instructions (Signed)
Go directly to Emergency room as discussed.

## 2018-02-11 NOTE — Unmapped (Signed)
[facility] Emergency Department Provider Note    ED Clinical Impression     Final diagnoses:   Renal cell carcinoma, unspecified laterality (CMS-HCC) (Primary)   Congestive heart failure, unspecified HF chronicity, unspecified heart failure type (CMS-HCC)   Elevated troponin       Initial Impression, ED Course, Assessment and Plan     Patient is a 67 y.o. male with a past medical history of lupus nephritis, renal cancer s/p renal transplant, CAD, hypertension, HLD, and gout presenting for 5 days of cough with associated shortness of breath with lying supine, wheezing, and fever (Tmax 100.22F), as well as 4 months of BLE swelling. He was seen at [facility] this morning and sent to the ED for a CXR concerning for PNA and new onset CHF.      On exam, the patient appears well and is in NAD. VS are WNL, he is satting at 95-96% at rest with HR 86. Diffuse inspiratory and expiratory wheezing. Crackles to mid lungs bilaterally. 1+ pitting edema to LLE to the knee. Calf measures 14 3/4 on the left, and 15 on right. Cervical lymphadenopathy present. Post-nasal drip. No chest wall tenderness. No frontal or maxillary sinus tenderness.     Differential includes PNA vs flu vs new onset CHF vs less likely pulmonary embolism, pneumonia, acute coronary syndrome, pneumothorax, severe anemia or metabolic etiology at this time. Plan for CXR, EKG, and labs, including rapid flu and troponin. Will check an ambulatory saturation and give duoneb. Plan to re-assess and anticipate admission    4:04 PM   At rest, patient was satting at 95-96% with HR of 86. With ambulation, his HR wa 103 with lowest oxygen saturation at 93%.      4:33 PM  Troponin elevated to 0.120, will give patient ASA and place him on monitor. Creatinine at baseline, CMP otherwise stable. CBC at baseline. PVL negative for DVT, CXR shows similar cardiomegaly with interval pulmonary edema. ED admit decision placed. Will paged MAO.    5:58 PM  Flu negative. Paged Nephrology to updated with POC. Plan to admit to [facility] main. Pt. Remains stable.     Labs     Labs Reviewed   COMPREHENSIVE METABOLIC PANEL - Abnormal; Notable for the following:        Result Value    Chloride 109 (*)     BUN 37 (*)     Creatinine 2.41 (*)     EGFR MDRD Non Af Amer 27 (*)     EGFR MDRD Af Amer 33 (*)     Anion Gap 7 (*)     Albumin 3.2 (*)     Total Protein 6.4 (*)     All other components within normal limits   TROPONIN I - Abnormal; Notable for the following:     Troponin I 0.120 (*)     All other components within normal limits   PRO-BNP - Abnormal; Notable for the following:     PRO-BNP 16,400.0 (*)     All other components within normal limits   TROPONIN I - Abnormal; Notable for the following:     Troponin I 0.180 (*)     All other components within normal limits   CBC W/ AUTO DIFF - Abnormal; Notable for the following:     WBC 4.0 (*)     RBC 4.21 (*)     HGB 11.8 (*)     HCT 38.8 (*)     MCHC 30.3 (*)  RDW 15.7 (*)     Platelet 81 (*)     Absolute Lymphocytes 0.3 (*)     Large Unstained Cells 5 (*)     Hypochromasia Moderate (*)     All other components within normal limits    Narrative:     Please use the Absolute Differential for reference ranges.    RAPID INFLUENZA PCR - Normal    Narrative:     Sensitivity of this assay is dependent on the quality of nasopharyngeal specimen collection. False negatives may occur with improper collection. Please see our website [email or web address]    This result was obtained using the FDA-cleared Cepheid Xpert Xpress Flu/RSV assay, which can detect influenza A, influenza B, and/or RSV. This assay does not type influenza A strains. The assay's performance characteristics have been verified by the [facility]. We observed >99% sensitivity and specificity for all viruses.    EXTRA TUBES    Narrative:     The following orders were created for panel order ED Extra Tubes.  Procedure                               Abnormality         Status                     ---------                               -----------         ------                     LAVENDER EDTA EXTRA UJWJ[[id number]]                        Final result               ORANGE SST EXTRA TUBE[[id number]]                           Final result               GOLD SST OZHYQ MVHQ[[id number]]                             Final result                 Please view results for these tests on the individual orders.   CBC W/ DIFFERENTIAL    Narrative:     The following orders were created for panel order CBC w/ Differential.  Procedure                               Abnormality         Status                     ---------                               -----------         ------                     CBC w/  Differential[[id number]]         Abnormal            Final result                 Please view results for these tests on the individual orders.   TROPONIN I   URINALYSIS   CREATININE, URINE   UREA NITROGEN, URINE   COMPREHENSIVE METABOLIC PANEL   MAGNESIUM   PHOSPHORUS   CBC W/ DIFFERENTIAL   TROPONIN I   PROTIME-INR   LAVENDER EDTA EXTRA TUBE   ORANGE SST EXTRA TUBE   GOLD SST EXTRA TUBE       Radiology     PVL Venous Duplex Lower Extremity Right         XR Chest 2 views   Final Result      Similar cardiomegaly with interval pulmonary edema.      Echocardiogram  W Colorflow Spectral Doppler    (Results Pending)      PVL Findings:  Right Duplex Findings  All veins visualized appear fully compressible. Doppler flow signals demonstrate normal spontaneity, phasicity, and augmentation.     Left Duplex Findings  CFV Doppler waveform appears appropriately phasic.     Right Technical Summary  No evidence of deep venous obstruction in the lower extremity. No indirect evidence of obstruction proximal to the inguinal ligament.     Left Technical Summary  No evidence of iliofemoral obstruction.  History Chief Complaint  Cough      HPI   Patient was seen by me at 3:43 PM.    Patient is a 67 y.o. male with a PMH of lupus nephritis, renal cancer s/p renal transplant, CAD, hypertension, HLD, and gout presenting to the ED for evaluation of cough. Patient endorses 5 days of cough with associated shortness of breath with lying supine, wheezing, and fever (Tmax 100.16F). He reports recent sick contacts. [facility] and they sent him to the ED due to CXR revealing PNA with concern for new onset CHF. He also reports 4 months of bilateral leg swelling, R>L. He saw his renal doctor for this awhile ago and they increased his Lasix prescription to 20mg  PO QD, with which he has been compliant, but this hasn't been helping. He elevates his legs at night with improvement, but they are swollen by the end of the day. He denies any leg or calf pain. No history of blood clots. No recent travel, periods of immobility,  Patient denies any chest pain, fevers, vomiting, myalgias, sore throat, or any other medical concerns at this time.    Previous chart, nursing notes, and vital signs reviewed.      Pertinent labs & imaging results that were available during my care of the patient were reviewed by me and considered in my medical decision making (see chart for details).    Past Medical History:   Diagnosis Date   ??? Coronary artery disease    ??? Disease of thyroid gland    ??? Gout    ??? Hyperlipidemia    ??? Hypertension    ??? Lupus nephritis (CMS-HCC)    ??? Red blood cell antibody positive     anti-K   ??? Renal cancer, right (CMS-HCC)    ??? Renal transplant, status post        Past Surgical History:   Procedure Laterality Date   ??? AV FISTULA PLACEMENT Right    ??? PR BREATH HYDROGEN TEST N/A 06/24/2016  Procedure: BREATH HYDROGEN TEST;  Surgeon: Nurse-Based Giproc;  [facility]: [facility];  Service: Gastroenterology   ??? PR COLONOSCOPY FLX DX W/COLLJ SPEC WHEN PFRMD N/A 05/22/2016    Procedure: COLONOSCOPY, FLEXIBLE, PROXIMAL TO SPLENIC FLEXURE; DIAGNOSTIC, W/WO COLLECTION SPECIMEN BY BRUSH OR WASH;  Surgeon: Liane Comber, MD;  Location: [facility] PROCED[facility];  Service: Gastroenterology   ??? PR NEPHRECTOMY Right 02/20/2014    Procedure: LAPAROSCOPY, SURGICAL; NEPHRECTOMY, INCLUDING PARTIAL URETERECTOMY;  Surgeon: Vivi Barrack, MD;  Location: MAIN OR [facility];  Service: Transplant   ??? TRANSPLANTATION RENAL           Current Facility-Administered Medications:   ???  acetaminophen (TYLENOL) tablet 650 mg, 650 mg, Oral, Q6H PRN, Maura Crandall, MD  ???  acyclovir (ZOVIRAX) tablet 400 mg, 400 mg, Oral, Daily, Oluseyi A Fayanju, MD  ???  albuterol 2.5 mg /3 mL (0.083 %) nebulizer solution 2.5 mg, 2.5 mg, Nebulization, Q6H PRN **AND** ipratropium (ATROVENT) 0.02 % nebulizer solution 500 mcg, 500 mcg, Nebulization, Q6H SCH, Maura Crandall, MD, 500 mcg at 02/12/18 0014  ???  allopurinol (ZYLOPRIM) tablet 150 mg, 150 mg, Oral, Daily, Oluseyi A Fayanju, MD  ???  aspirin chewable tablet 81 mg, 81 mg, Oral, Daily, Oluseyi A Fayanju, MD  ???  atorvastatin (LIPITOR) tablet 20 mg, 20 mg, Oral, Daily, Maura Crandall, MD  ???  calcitriol (ROCALTROL) capsule 0.25 mcg, 0.25 mcg, Oral, Q MWF, Maura Crandall, MD, 0.25 mcg at 02/12/18 0010  ???  enalapril (VASOTEC) tablet 5 mg, 5 mg, Oral, BID, Maura Crandall, MD, 5 mg at 02/12/18 0010  ???  ferrous sulfate tablet 325 mg, 325 mg, Oral, TID (RT), Maura Crandall, MD  ???  furosemide (LASIX) injection 20 mg, 20 mg, Intravenous, BID, Oluseyi A Fayanju, MD  ???  heparin (porcine) injection 5,000 Units, 5,000 Units, Subcutaneous, Q8H SCH, Maura Crandall, MD, 5,000 Units at 02/12/18 0020  ???  levothyroxine (SYNTHROID, LEVOTHROID) tablet 225 mcg, 225 mcg, Oral, daily, Oluseyi A Fayanju, MD  ???  metoprolol succinate (TOPROL-XL) 24 hr tablet 100 mg, 100 mg, Oral, Daily, Maura Crandall, MD  ???  mycophenolate (CELLCEPT) capsule 250 mg, 250 mg, Oral, BID, Maura Crandall, MD, 250 mg at 02/12/18 0010  ??? ondansetron (ZOFRAN-ODT) disintegrating tablet 4 mg, 4 mg, Oral, Q8H PRN, Maura Crandall, MD  ???  pantoprazole (PROTONIX) EC tablet 20 mg, 20 mg, Oral, Daily, Maura Crandall, MD  ???  pneumococcal polysacchride (23-valps) (PNEUMOVAX) vaccine 0.5 mL, 0.5 mL, Intramuscular, During hospitalization, Maura Crandall, MD  ???  senna (SENOKOT) tablet 2 tablet, 2 tablet, Oral, Nightly PRN, Maura Crandall, MD  ???  simethicone (MYLICON) chewable tablet 80 mg, 80 mg, Oral, Q6H PRN, Maura Crandall, MD  ???  sodium bicarbonate tablet 650 mg, 650 mg, Oral, BID, Maura Crandall, MD, 650 mg at 02/12/18 0010  ???  tacrolimus (PROGRAF) capsule 3 mg, 3 mg, Oral, Q12H, Maura Crandall, MD    Allergies  Penicillins    Family History   Problem Relation Age of Onset   ??? Heart disease Mother    ??? Cancer Father    ??? Cardiomyopathy Neg Hx    ??? Sudden Cardiac Death Neg Hx    ??? Melanoma Neg Hx    ??? Basal cell carcinoma Neg Hx    ??? Squamous cell carcinoma Neg Hx        Social History  Social History  Substance Use Topics   ??? Smoking status: Never Smoker   ??? Smokeless tobacco: Never Used   ??? Alcohol use No     Review of Systems    Constitutional: Positive for fever.. Negative for wt. loss.  Eyes: Negative for visual changes, erythema, drainage.  ENT: Negative for ear pain, loss of hearing. Negative for rhinorrhea or epistaxis. Negative for sore throat, trismus, or hoarse voice.  Cardiovascular: Negative for chest pain or palpitations.  Respiratory: Positive for shortness of breath and cough.  Gastrointestinal: Negative for abdominal pain, nausea, vomiting, constipation or diarrhea.  Genitourinary: Negative for dysuria, urgency, frequency, hesitancy, hematuria.  Musculoskeletal: Positive for leg swelling. Negative for back pain. Negative for neck pain.  Skin: Negative for rash. Negative for pallor. Negative for diaphoresis.    Physical Exam     VITAL SIGNS:    Vitals:    02/11/18 1900 02/11/18 2000 02/11/18 2148 02/12/18 0032   BP: 152/82 154/86 151/82 158/87   Pulse: 101  98 88   Resp: 19  18 18    Temp:   36.7 ??C (98.1 ??F) 36.4 ??C (97.5 ??F)   TempSrc:   Oral Oral   SpO2: 94% 97% 97% 98%   Weight:       Height:         Constitutional: Alert and oriented. In no distress.  Eyes: Conjunctivae are normal.   ENT       Head: Normocephalic and atraumatic. No frontal or maxillary sinus tenderness.        Ear: EACs without exudate or erythema. TMs without erythema or effusion       Nose: Congestion. No epistaxis       Mouth/Throat: Post-nasal drip. Mucous membranes are moist. Posterior        Oropharynx patent. Tonsils without erythema or exudate. Uvula is midline. Soft        Palate is soft without induration. Dentition intact without cracks/decay.       Neck: No stridor. Full ROM without pain.  Lymphatic: Cervical lymphadenopathy present  Cardiovascular: Normal rate, regular rhythm. Normal and symmetric distal pulses are present in all extremities. No gallops, murmurs, or clicks.  Respiratory: No chest wall tenderness. Diffuse inspiratory and expiratory wheezing. Crackles to mid lungs bilaterally. Normal respiratory effort.   Gastrointestinal: Soft and nondistended  Musculoskeletal: 1+ pitting edema to BLE to the knee. Calf measures 14 3/4 in on the left, and 15 on right. 2+ DP/PT pulses. Nontender joints and muscles with normal range of motion in all extremities.  Neurologic: Normal speech and language. No gross focal neurologic deficits  Skin: Skin is warm, dry and intact. No rash noted. No pallor.  Psychiatric: Mood and affect are normal. Speech and behavior are normal.     EKG     No STEMI. T wave inversions in I and V6. New from prior on 01/11/14.     Documentation assistance was provided by Lurena Nida, Scribe, on February 11, 2018 at 3:46 PM for Landis Martins, NP.     February 12, 2018 12:38 AM. Documentation assistance provided by the scribe. I was present during the time the encounter was recorded. The information recorded by the scribe was done at my direction and has been reviewed and validated by me.        Hareem Surowiec Cox [place], [place]  02/12/18 0045

## 2018-02-12 LAB — INR: Lab: 1.39

## 2018-02-12 LAB — COMPREHENSIVE METABOLIC PANEL
ALBUMIN: 2.6 g/dL — ABNORMAL LOW (ref 3.5–5.0)
ALKALINE PHOSPHATASE: 68 U/L (ref 38–126)
ALT (SGPT): 33 U/L (ref 19–72)
ANION GAP: 7 mmol/L — ABNORMAL LOW (ref 9–15)
AST (SGOT): 34 U/L (ref 19–55)
BILIRUBIN TOTAL: 0.8 mg/dL (ref 0.0–1.2)
BLOOD UREA NITROGEN: 37 mg/dL — ABNORMAL HIGH (ref 7–21)
CALCIUM: 9 mg/dL (ref 8.5–10.2)
CHLORIDE: 109 mmol/L — ABNORMAL HIGH (ref 98–107)
CO2: 25 mmol/L (ref 22.0–30.0)
CREATININE: 2.48 mg/dL — ABNORMAL HIGH (ref 0.70–1.30)
EGFR MDRD AF AMER: 32 mL/min/{1.73_m2} — ABNORMAL LOW (ref >=60)
EGFR MDRD NON AF AMER: 26 mL/min/{1.73_m2} — ABNORMAL LOW (ref >=60)
GLUCOSE RANDOM: 140 mg/dL (ref 65–179)
POTASSIUM: 4 mmol/L (ref 3.5–5.0)
PROTEIN TOTAL: 5.1 g/dL — ABNORMAL LOW (ref 6.5–8.3)
SODIUM: 141 mmol/L (ref 135–145)

## 2018-02-12 LAB — CBC W/ AUTO DIFF
BASOPHILS ABSOLUTE COUNT: 0 10*9/L (ref 0.0–0.1)
BASOPHILS RELATIVE PERCENT: 0.3 %
EOSINOPHILS ABSOLUTE COUNT: 0.1 10*9/L (ref 0.0–0.4)
EOSINOPHILS RELATIVE PERCENT: 2.1 %
HEMATOCRIT: 35.9 % — ABNORMAL LOW (ref 41.0–53.0)
HEMOGLOBIN: 10.8 g/dL — ABNORMAL LOW (ref 13.5–17.5)
LARGE UNSTAINED CELLS: 4 % (ref 0–4)
LYMPHOCYTES ABSOLUTE COUNT: 0.5 10*9/L — ABNORMAL LOW (ref 1.5–5.0)
LYMPHOCYTES RELATIVE PERCENT: 16.3 %
MEAN CORPUSCULAR HEMOGLOBIN CONC: 30.2 g/dL — ABNORMAL LOW (ref 31.0–37.0)
MEAN CORPUSCULAR HEMOGLOBIN: 27.8 pg (ref 26.0–34.0)
MEAN PLATELET VOLUME: 10 fL (ref 7.0–10.0)
MONOCYTES ABSOLUTE COUNT: 0.3 10*9/L (ref 0.2–0.8)
MONOCYTES RELATIVE PERCENT: 10.2 %
NEUTROPHILS ABSOLUTE COUNT: 1.9 10*9/L — ABNORMAL LOW (ref 2.0–7.5)
NEUTROPHILS RELATIVE PERCENT: 66.9 %
RED BLOOD CELL COUNT: 3.9 10*12/L — ABNORMAL LOW (ref 4.50–5.90)
WBC ADJUSTED: 2.9 10*9/L — ABNORMAL LOW (ref 4.5–11.0)

## 2018-02-12 LAB — CREATININE, URINE: Lab: 57.4

## 2018-02-12 LAB — URINALYSIS
BACTERIA: NONE SEEN /HPF
BILIRUBIN UA: NEGATIVE
GLUCOSE UA: NEGATIVE
KETONES UA: NEGATIVE
LEUKOCYTE ESTERASE UA: NEGATIVE
NITRITE UA: NEGATIVE
RBC UA: 5 /HPF — ABNORMAL HIGH (ref <=3)
SPECIFIC GRAVITY UA: 1.007 (ref 1.003–1.030)
UROBILINOGEN UA: 0.2
WBC UA: <1 /HPF (ref <=2)

## 2018-02-12 LAB — MAGNESIUM: Magnesium:MCnc:Pt:Ser/Plas:Qn:: 1.1 — ABNORMAL LOW

## 2018-02-12 LAB — TACROLIMUS, TROUGH: Lab: 4.7

## 2018-02-12 LAB — TACROLIMUS LEVEL, TROUGH: TACROLIMUS, TROUGH: 4.7 ng/mL (ref <=20.0)

## 2018-02-12 LAB — WBC UA: Lab: <1

## 2018-02-12 LAB — BASOPHILS ABSOLUTE COUNT: Lab: 0

## 2018-02-12 LAB — PHOSPHORUS: Phosphate:MCnc:Pt:Ser/Plas:Qn:: 3.6

## 2018-02-12 LAB — SMEAR REVIEW

## 2018-02-12 LAB — SLIDE REVIEW

## 2018-02-12 LAB — BUN / CREAT RATIO: Urea nitrogen/Creatinine:MRto:Pt:Ser/Plas:Qn:: 15

## 2018-02-12 LAB — UREA NITROGEN URINE: Lab: 290

## 2018-02-12 LAB — TROPONIN I
Troponin I.cardiac:MCnc:Pt:Ser/Plas:Qn:: 0.236
Troponin I.cardiac:MCnc:Pt:Ser/Plas:Qn:: 0.271

## 2018-02-12 MED ORDER — ALLOPURINOL 300 MG PO TABS
150.00 mg | ORAL_TABLET | ORAL | Status: DC
Start: 2018-02-13 — End: 2018-02-12

## 2018-02-12 MED ORDER — ACETAMINOPHEN 325 MG PO TABS
650.00 mg | ORAL_TABLET | ORAL | Status: DC
Start: ? — End: 2018-02-12

## 2018-02-12 MED ORDER — FUROSEMIDE 10 MG/ML IJ SOLN
20.00 mg | INTRAMUSCULAR | Status: DC
Start: 2018-02-12 — End: 2018-02-12

## 2018-02-12 MED ORDER — ACYCLOVIR 800 MG PO TABS
400.00 mg | ORAL_TABLET | ORAL | Status: DC
Start: 2018-02-13 — End: 2018-02-12

## 2018-02-12 MED ORDER — SIMETHICONE 80 MG PO CHEW
80.00 mg | CHEWABLE_TABLET | ORAL | Status: DC
Start: ? — End: 2018-02-12

## 2018-02-12 MED ORDER — SODIUM BICARBONATE 650 MG PO TABS
650.00 mg | ORAL_TABLET | ORAL | Status: DC
Start: 2018-02-12 — End: 2018-02-12

## 2018-02-12 MED ORDER — TACROLIMUS 1 MG PO CAPS
3.00 mg | ORAL_CAPSULE | ORAL | Status: DC
Start: 2018-02-12 — End: 2018-02-12

## 2018-02-12 MED ORDER — GENERIC EXTERNAL MEDICATION
225.00 | Status: DC
Start: 2018-02-13 — End: 2018-02-12

## 2018-02-12 MED ORDER — PNEUMOCOCCAL VAC POLYVALENT 25 MCG/0.5ML IJ INJ
0.50 | INJECTION | INTRAMUSCULAR | Status: DC
Start: ? — End: 2018-02-12

## 2018-02-12 MED ORDER — FERROUS SULFATE 325 (65 FE) MG PO TABS
325.00 mg | ORAL_TABLET | ORAL | Status: DC
Start: 2018-02-12 — End: 2018-02-12

## 2018-02-12 MED ORDER — METOPROLOL SUCCINATE ER 100 MG PO TB24
100.00 mg | ORAL_TABLET | ORAL | Status: DC
Start: 2018-02-13 — End: 2018-02-12

## 2018-02-12 MED ORDER — CALCITRIOL 0.25 MCG PO CAPS
.25 | ORAL_CAPSULE | ORAL | Status: DC
Start: 2018-02-14 — End: 2018-02-12

## 2018-02-12 MED ORDER — ENALAPRIL MALEATE 10 MG PO TABS
5.00 mg | ORAL_TABLET | ORAL | Status: DC
Start: 2018-02-12 — End: 2018-02-12

## 2018-02-12 MED ORDER — GENERIC EXTERNAL MEDICATION
Status: DC
Start: ? — End: 2018-02-12

## 2018-02-12 MED ORDER — ONDANSETRON 4 MG PO TBDP
4.00 mg | ORAL_TABLET | ORAL | Status: DC
Start: ? — End: 2018-02-12

## 2018-02-12 MED ORDER — SENNOSIDES 8.6 MG PO TABS
2.00 | ORAL_TABLET | ORAL | Status: DC
Start: ? — End: 2018-02-12

## 2018-02-12 MED ORDER — ASPIRIN 81 MG PO CHEW
81.00 mg | CHEWABLE_TABLET | ORAL | Status: DC
Start: 2018-02-13 — End: 2018-02-12

## 2018-02-12 MED ORDER — CEFEPIME HCL 1 G IJ SOLR
1.00 | INTRAMUSCULAR | Status: DC
Start: 2018-02-12 — End: 2018-02-12

## 2018-02-12 MED ORDER — ATORVASTATIN CALCIUM 20 MG PO TABS
20.00 mg | ORAL_TABLET | ORAL | Status: DC
Start: 2018-02-13 — End: 2018-02-12

## 2018-02-12 MED ORDER — HEPARIN SODIUM (PORCINE) 5000 UNIT/ML IJ SOLN
5000.00 | INTRAMUSCULAR | Status: DC
Start: 2018-02-12 — End: 2018-02-12

## 2018-02-12 MED ORDER — PANTOPRAZOLE SODIUM 20 MG PO TBEC
20.00 mg | DELAYED_RELEASE_TABLET | ORAL | Status: DC
Start: 2018-02-13 — End: 2018-02-12

## 2018-02-12 MED ORDER — MYCOPHENOLATE MOFETIL 250 MG PO CAPS
250.00 mg | ORAL_CAPSULE | ORAL | Status: DC
Start: 2018-02-12 — End: 2018-02-12

## 2018-02-12 NOTE — Unmapped (Signed)
General Medicine History and Physical    Assessment/Plan:    Principal Problem:    Pneumonia due to infectious organism  Active Problems:    Hypercholesterolemia    Hypertension    History of kidney transplant    Immunosuppression-related infectious disease    Elevated brain natriuretic peptide (BNP) level    Bilateral lower extremity edema    Elevated troponin I level    Normocytic anemia    Gout    GERD (gastroesophageal reflux disease)      Miguel Ward is a 67 y.o. male with a medical history that includes deceased donor??kidney transplant on 06/11/2007 secondary to lupus nephritis, right renal nephrectomy (02/20/2014) in the setting of renal cell carcinoma, CAD, HTN, prior pericardial effusion, hypothyroidism and gout transferred 02/11/2018 to [facility] after presenting to [facility] ED with cough and wheezing, possibly secondary to community-acquired pneumonia and new onset CHF.    Pneumonia: Pneumonia in kidney transplant patient; Wheezing, pulmonary edema, productive cough; the patient reports that he received a flu shot circa September/October 2018. 02/11/18 Rapid Influenza PCR negative. The patient was likely also exposed to respiratory pathogens at the church conference he attended in [place] about one week prior to 02/11/2018 admission, or could have been exposed to possible sources of respiratory infection from his grandchildren or other parties at home  - IV vancomycin (3/2 - ) x 7 days  - IV cefepime (3/2 - ) x 7 days  - PRN acetaminophen for fever  - Droplet precaution  - PRN DUONEB  [ ]  FOR 3/2: Consider paging ICID team vs formal consult for further recommendations  [ ]  f/u Respiratory Pathogen Panel (ordered, not yet collected)  [ ]  f/u Lower Respiratory Culture (ordered, not yet collected)  ??  Troponemia: possibly demand ischemia in the setting of pneumonia, increase from 0.120 on 02/11/18 admission at 1525 to 0.180 around 1930 to 0.271 around 2345 on 02/11/18; patient is now s/p aspirin 325 mg on 02/11/18 admission; Admit ECG showed sinus rhythm with premature atrial beats and a borderline prolonged QTc (469); patient carries a prior diagnosis of coronary atherosclerosis of native coronary artery per EPIC review  [ ]  FOR 02/12/18: f/u Troponin Q6H X 3; continue Q6H until downtrending; if further uptrend, discuss with Cardiology  [ ]  FOR 02/12/18 AM: f/u repeat ECG 12 Lead  - continue aspirin 81 mg  - Cardiac medications as below  ??  Lower Extremity Edema/Elevated pro-BNP: Admit weight 79.4 kg appears to be stable versus 12/17/17 clinic weight (81.6 kg) when seen by primary nephrologist. No echocardiogram on file. The patient is s/p kidney transplant and right nephrectomy for renal cell carcinoma which colors interpretation of elevated pro-BNP (02/11/18: 16,400). 02/11/18 XR Chest significant for pulmonary edema. On admit exam, the patient was was breathing comfortably on room air but had crackles in his bilateral lower lung fields and 2+ pitting edema in his bilateral LEs. Patient reports that leg swelling began about four months prior to 02/11/2018 admission and he had been trying to manage it with PO LASIX 20 mg with minimal improvement per the patient (he tried to keep his legs elevated while at home). The patient has been on PO LASIX 20 mg QD at home but was also given IV Lasix 20 mg at [facility] on 02/11/18 admission. Given recent trip to conference it is possible that a viral myocarditis could be at play causing an acute worsening of his cardiac function; however, the subacute presentation of evolving edema could suggest underlying  heart dysfunction that preceded present illness  - Telemetry x 48 hrs (3/1 - ) for acute/subacute CHF  - IV Lasix 20 mg BID x 2 days (3/2 - ); can reassess further dosing  - I/O  - Daily weights  [ ]  f/u Echocardiogram (ordered)  ??  History of kidney transplant: Follows with Dr. Posey Boyer True; deceased donor??kidney transplant on 06/11/2007 secondary to lupus nephritis, right renal nephrectomy (02/20/2014) in the setting of renal cell carcinoma; patient reports that he has his transplanted kidney in his RLQ and a left kidney; admission creatinine (02/11/18) of 2.41 appears to be within the patient's baseline range from 2.0 - 3.0 from 01/2015 - 01/2018   - Continue PO calcitriol 0.25 mcg M/W/F  - Continue mycophenolate 250 mg BID  - Continue tacrolimus 3 mg Q12H  - Continue sodium bicarbonate 650 mg BID  - Continue acyclovir ppx  - Daily BUN, CR  [ ]  f/u Urinalysis, Urine Creatinine, Urine Urea (ordered)  ??  HTN:  - Continue enalapril 5 mg BID  - Continue metoprolol succinate 100 mg QD  ??  HLD:  - PO atorvastatin  ??  Normocytic anemia:  - Continue PO ferrous sulfate 325 mg TID  - Daily CBC  ??  Hypothyroidism:  - continue levothyroxine 225 mcg QD  [ ]  POST DISCHARGE: recommend re-check of TSH, free T4 as outpatient to confirm appropriate dosing  ??  GERD:  - PPI  ??  Gout:  - PO allopurinol  ??  Diet: Heart Healthy  DVT ppx: heparin subQ  ??  Access:    Patient Lines/Drains/Airways Status    Active Active Lines, Drains, & Airways     Name:   Placement date:   Placement time:   Site:   Days:    Peripheral IV 02/11/18 Left Antecubital  02/11/18    1525    Antecubital    less than 1              Code status: Code with Limitations; Does NOT want mechanical ventilation with intubation; discussed with the patient on admission    Dispo: Med B, floor    ___________________________________________________________________    Chief Complaint:  Chief Complaint   Patient presents with   ??? Cough     Pneumonia due to infectious organism    HPI:    Miguel Ward is a 67 y.o. male with a medical history that includes deceased donor??kidney transplant on 06/11/2007 secondary to lupus nephritis, right renal nephrectomy (02/20/2014) in the setting of renal cell carcinoma, pericardial effusion, CAD, HTN, hypothyroidism and gout transferred 02/11/2018 to [facility] after presenting to [facility] ED with cough and wheezing x five days. History is gathered from prior notes and the patient.  ??  The patient reported that he had intense coughing intermittently productive of brownish-yellow mucus and railing and rumbling in his chest associated with difficulty breathing while laying flat. These symptoms had reportedly been going on for about six days prior to presentation. He reported that he on and off required three pillows behind him at night to get comfortable. He denied hemoptysis. He tried Robitussin at home but reported no significant relief from his symptoms. The patient drove himself to [facility] on Friday 02/11/18 AM and was told to go to the Emergency Department. He went to [facility] where his kidney team was informed of his arrival; he was advised to transfer to [facility] for admission to the Med B service.   ??  Significant  labs on 02/11/18 admission included BUN 37, Creatinine 2.41, Albumin 3.2, Troponin 0.120, pro-BNP 16,400, WBC 4.0, Hemoglobin 11.8 (MCV 92.2), Platelet 81, absolute lymphocytes 0.3 (low), and 5% large unstained cells (borderline high). 02/11/18 XR Chest 2 Views showed cardiomegaly with pulmonary edema. 02/11/18 ECG 12 Lead showed sinus rhythm with premature atrial beats and a borderline long QTc (469). The patient's most recent renal ultrasound was done 12/30/17 on the date of a renal biopsy and showed renal transplant in the right lower quadrant with no hydronephrosis or perinephric fluid. Surgical pathology collected 12/30/17 showed chronic changes in the allograft thought secondary to hypertension but no evidence of rejection per 01/17/18 pathology review.  ??  The patient was given albuterol/ipratropium around 1600 and IV Lasix 20 mg at [facility] around 1630. He also received aspirin 325 mg.  ??  The patient underwent PVL Venous Duplex Lower Extremity Right on 02/11/18 which, per preliminary review, did not show evidence of deep venous obstruction in the right lower extremity.   ??  ROS:  Gen: -sick contacts, +fever (Tmax: 100.8 (2/28 PM)), -chills, +recent travel (went to a church conference in [place], [place] 2/21 - 2/23, was feeling fine before that), -fatigue  HEENT: -eye injection, -sinus tenderness, +rhinitis  Pulm: -shortness of breath, -pleuritic chest pain, +productive cough (intermittent; brownish mucus), -hemoptysis  Card: -chest pain, -palpitations, -dyspnea on exertion, -PND  GI: -abdominal pain, -nausea, -vomiting, -diarrhea, -constipation, -melena, -hematochezia  GU: -dysuria, -hematuria  Skin: -rash, -bites  Neuro: -headache, -dizziness, -gait instability, -vision changes, -hearing changes  Heme: -bruising      Allergies:  Penicillins    Medications:   Prior to Admission medications    Medication Dose, Route, Frequency   acyclovir (ZOVIRAX) 400 MG tablet 400 mg, Oral, Daily (standard)   allopurinol (ZYLOPRIM) 100 MG tablet 150 mg, Oral, Daily (standard)   aspirin (ASPIRIN LOW-STRENGTH) 81 MG chewable tablet 81 mg, Daily (standard)   atorvastatin (LIPITOR) 20 MG tablet 20 mg, Oral, Daily (standard)   calcitriol (ROCALTROL) 0.25 MCG capsule 0.25 mcg, Oral, Every Mon-Wed-Fri   CELLCEPT 250 mg capsule 250 mg, Oral, 2 times a day (standard)   colchicine (COLCRYS) 0.6 mg tablet 0.6 mg, Oral, Daily PRN   enalapril (VASOTEC) 5 MG tablet 5 mg, Oral, 2 times a day (standard)   ferrous sulfate 325 (65 FE) MG tablet 325 mg, 3 times daily (RT)   fluticasone (FLONASE) 50 mcg/actuation nasal spray 1 spray, Nasal, Daily PRN   furosemide (LASIX) 40 MG tablet 20 mg, Oral, Daily (standard)   levothyroxine (SYNTHROID) 200 MCG tablet Take 1 tab with one tab, total dose daily   levothyroxine (SYNTHROID, LEVOTHROID) 25 MCG tablet Take 1 tab with one tab, total dose daily   metoprolol succinate (TOPROL-XL) 100 MG 24 hr tablet 100 mg, Oral, Daily (standard)   omeprazole (PRILOSEC) 20 MG capsule 20 mg, Oral, Daily (standard)   PROGRAF 1 mg capsule 3 mg, Oral, 2 times a day (standard)   simethicone (GAS-X) 80 MG chewable tablet 80 mg, Oral, Every 6 hours PRN   sodium bicarbonate 650 mg tablet 650 mg, Oral, 2 times a day (standard)       Medical History:  Past Medical History:   Diagnosis Date   ??? Coronary artery disease    ??? Disease of thyroid gland    ??? Gout    ??? Hyperlipidemia    ??? Hypertension    ??? Lupus nephritis (CMS-HCC)    ??? Red blood cell  antibody positive     anti-K   ??? Renal cancer, right (CMS-HCC)    ??? Renal transplant, status post        Surgical History:  Past Surgical History:   Procedure Laterality Date   ??? AV FISTULA PLACEMENT Right    ??? PR BREATH HYDROGEN TEST N/A 06/24/2016    Procedure: BREATH HYDROGEN TEST;  Surgeon: Nurse-Based Giproc;  [facility];  Service: Gastroenterology   ??? PR COLONOSCOPY FLX DX W/COLLJ SPEC WHEN PFRMD N/A 05/22/2016    Procedure: COLONOSCOPY, FLEXIBLE, PROXIMAL TO SPLENIC FLEXURE; DIAGNOSTIC, W/WO COLLECTION SPECIMEN BY BRUSH OR WASH;  Surgeon: Liane Comber, MD;  Location: [facility] PROCED[facility];  Service: Gastroenterology   ??? PR NEPHRECTOMY Right 02/20/2014    Procedure: LAPAROSCOPY, SURGICAL; NEPHRECTOMY, INCLUDING PARTIAL URETERECTOMY;  Surgeon: Vivi Barrack, MD;  Location: MAIN OR [facility];  Service: Transplant   ??? TRANSPLANTATION RENAL         Social History:  Social History     Social History   ??? Marital status: Married     Spouse name: N/A   ??? Number of children: N/A   ??? Years of education: N/A     Occupational History   ??? Not on file.     Social History Main Topics   ??? Smoking status: Never Smoker   ??? Smokeless tobacco: Never Used   ??? Alcohol use No   ??? Drug use: No   ??? Sexual activity: Not on file     Other Topics Concern   ??? Do You Use Sunscreen? No   ??? Tanning Bed Use? No   ??? Are You Easily Burned? No   ??? Excessive Sun Exposure? No   ??? Blistering Sunburns? No     Social History Narrative    02/11/18: The patient lives with his wife, his daughter, and his two grandchildren in [place], [place]. 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 No history of Financial planner. No smoking history. No reported alcohol history.        Family History:  Family History   Problem Relation Age of Onset   ??? Heart disease Mother    ??? Cancer Father    ??? Cardiomyopathy Neg Hx    ??? Sudden Cardiac Death Neg Hx    ??? Melanoma Neg Hx    ??? Basal cell carcinoma Neg Hx    ??? Squamous cell carcinoma Neg Hx        Review of Systems:  10 systems reviewed and are negative unless otherwise mentioned in HPI    Labs/Studies:  Labs and Studies from the last 24hrs per EMR and Reviewed    Recent Results (from the past 24 hour(s))   Comprehensive Metabolic Panel    Collection Time: 02/11/18  3:25 PM   Result Value Ref Range    Sodium 141 135 - 145 mmol/L    Potassium 3.9 3.5 - 5.0 mmol/L    Chloride 109 (H) 98 - 107 mmol/L    CO2 25.0 22.0 - 30.0 mmol/L    BUN 37 (H) 7 - 21 mg/dL    Creatinine 1.61 (H) 0.70 - 1.30 mg/dL    BUN/Creatinine Ratio 15     EGFR MDRD Non Af Amer 27 (L) >=60 mL/min/1.20m2    EGFR MDRD Af Amer 33 (L) >=60 mL/min/1.74m2    Anion Gap 7 (L)  9 - 15 mmol/L    Glucose 102 65 - 179 mg/dL    Calcium 9.7 8.5 - 21.3 mg/dL    Albumin 3.2 (L) 3.5 - 5.0 g/dL    Total Protein 6.4 (L) 6.5 - 8.3 g/dL    Total Bilirubin 1.2 0.0 - 1.2 mg/dL    AST 43 19 - 55 U/L    ALT 36 19 - 72 U/L    Alkaline Phosphatase 81 38 - 126 U/L   Troponin I    Collection Time: 02/11/18  3:25 PM   Result Value Ref Range    Troponin I 0.120 (HH) <0.060 ng/mL   Pro-BNP    Collection Time: 02/11/18  3:25 PM   Result Value Ref Range    PRO-BNP 16,400.0 (H) 0.0 - 177.0 pg/mL   CBC w/ Differential    Collection Time: 02/11/18  3:25 PM   Result Value Ref Range    WBC 4.0 (L) 4.5 - 11.0 10*9/L    RBC 4.21 (L) 4.50 - 5.90 10*12/L    HGB 11.8 (L) 13.5 - 17.5 g/dL    HCT 08.6 (L) 57.8 - 53.0 %    MCV 92.2 80.0 - 100.0 fL    MCH 27.9 26.0 - 34.0 pg    MCHC 30.3 (L) 31.0 - 37.0 g/dL    RDW 46.9 (H) 62.9 - 15.0 %    MPV 9.5 7.0 - 10.0 fL Platelet 81 (L) 150 - 440 10*9/L    Neutrophils % 72.1 %    Lymphocytes % 8.6 %    Monocytes % 9.6 %    Eosinophils % 4.9 %    Basophils % 0.2 %    Absolute Neutrophils 2.9 2.0 - 7.5 10*9/L    Absolute Lymphocytes 0.3 (L) 1.5 - 5.0 10*9/L    Absolute Monocytes 0.4 0.2 - 0.8 10*9/L    Absolute Eosinophils 0.2 0.0 - 0.4 10*9/L    Absolute Basophils 0.0 0.0 - 0.1 10*9/L    Large Unstained Cells 5 (H) 0 - 4 %    Hypochromasia Moderate (A) Not Present   ECG 12 lead (Adult)    Collection Time: 02/11/18  4:40 PM   Result Value Ref Range    EKG Systolic BP  mmHg    EKG Diastolic BP  mmHg    EKG Ventricular Rate 88 BPM    EKG Atrial Rate 88 BPM    EKG P-R Interval 168 ms    EKG QRS Duration 92 ms    EKG Q-T Interval 388 ms    EKG QTC Calculation 469 ms    EKG Calculated P Axis 18 degrees    EKG Calculated R Axis -3 degrees    EKG Calculated T Axis 116 degrees   Rapid Influenza PCR    Collection Time: 02/11/18  4:46 PM   Result Value Ref Range    Influenza A Negative Negative    Influenza B Negative Negative   Troponin I    Collection Time: 02/11/18  7:34 PM   Result Value Ref Range    Troponin I 0.180 (HH) <0.060 ng/mL   Troponin I    Collection Time: 02/11/18 11:50 PM   Result Value Ref Range    Troponin I 0.271 (HH) <0.034 ng/mL     Xr Chest 2 Views    Result Date: 02/11/2018  EXAM: XR CHEST 2 VIEWS DATE: 02/11/2018 2:57 PM ACCESSION: [id number] UN DICTATED: 02/11/2018 3:07 PM INTERPRETATION LOCATION: Main Campus     CLINICAL INDICATION: 66 years  old Male with COUGH-      COMPARISON: 08/28/2016     TECHNIQUE: PA and Lateral Chest Radiographs.     FINDINGS:     Increased bilateral reticular opacity.     No pleural effusion or pneumothorax.     Enlarged cardiac silhouette unchanged.             Similar cardiomegaly with interval pulmonary edema.    Pvl Venous Duplex Lower Extremity Right    Result Date: 02/11/2018   Peripheral Vascular Lab     [address]   [place], [place] [address]  PVL VENOUS DUPLEX LOWER EXTREMITY RIGHT Patient Demographics Pt. Name: DELOYD HANDY Location: Emergency Department MRN:      [id number]        Sex:      M DOB:      November 10, 1951     Age:      35 years  Study Information Authorizing         [phone] AMBER COX      Performed Time       02/11/2018 4:10:51 Provider Name       RAFFERTY                                   PM Ordering Physician  AMBER RAFFERTY        Patient Location     [facility] Accession Number    [id number] UN         Technologist         Sam Clymer Diagnosis:                                Assisting                                           Technologist Ordered Reason For Exam: r/o DVT Indication: Swelling Protocol The major deep veins from the inguinal ligament to the ankle are assessed for compressibility and spectral Doppler flow characteristics on the requested limb. The assessed veins include common femoral vein, femoral vein in the thigh, popliteal vein, and intramuscular calf veins. The iliac vein is assessed indirectly using Doppler waveform analysis. The great saphenous vein is assessed for compressibility at the saphenofemoral junction, and the small saphenous vein assessed for compressibility behind the knee. A contralateral PW Doppler waveform is obtained for comparison.  Right Duplex Findings All veins visualized appear fully compressible. Doppler flow signals demonstrate normal spontaneity, phasicity, and augmentation.  Left Duplex Findings CFV Doppler waveform appears appropriately phasic.  Right Technical Summary No evidence of deep venous obstruction in the lower extremity. No indirect evidence of obstruction proximal to the inguinal ligament.  Left Technical Summary No evidence of iliofemoral obstruction.  [ Preliminary ]    Physical Exam:  BP 158/87  - Pulse 88  - Temp 36.4 ??C (Oral)  - Resp 18  - Ht 182.9 cm (6')  - Wt 79.4 kg (175 lb)  - SpO2 98%  - BMI 23.73 kg/m?? , No intake or output data in the 24 hours ending 02/12/18 0405, Body mass index is 23.73 kg/m??.,   Wt Readings from Last 3 Encounters:   02/11/18 79.4 kg (175 lb)   12/30/17 79.4 kg (175 lb)  12/17/17 81.6 kg (180 lb)     Gen: well-appearing, well-built male in NAD  HEENT: Glasses on, EOMI, oropharynx clear  Pulm: Crackles in bilateral lower lung fields, coarse breath sounds with expiratory and inspiratory wheezing  Card: RRR, no appreciable m/r/g  Abd: Soft, NTND  Ext: 2+ pitting edema in bilateral LEs, R ankle circumference > L ankle circumference, 5/5 hip flexion bilaterally  Neuro: A&O, finger grip intact bilaterally  Psych: mood, affect appropriate

## 2018-02-12 NOTE — Unmapped (Signed)
The following has been copied & pasted from current H & P  Miguel Ward??is a 67 y.o.??male??with a medical history that includes deceased donor??kidney transplant on 06/11/2007 secondary to lupus nephritis, right renal nephrectomy (02/20/2014) in the setting of renal cell carcinoma, CAD, HTN, prior pericardial effusion, hypothyroidism and gout transferred 02/11/2018??to [facility] after presenting to [facility] ED with cough and wheezing, possibly secondary to community-acquired pneumonia and new onset CHF.           Care Management  Initial Transition Planning Assessment              General  Care Manager assessed the patient by : In person interview with patient, Medical record review, Discussion with Clinical Care team, In person interview with family  Orientation Level: Oriented X4  Who provides care at home?: N/A    Contact/Decision Maker:    Contact Details  Contact Details: Primary Contact  Primary Contact Name: Connye Burkitt  Primary Contact Relationship: Spouse  Phone #1: [phone]    Advance Directive (Medical Treatment)  Does patient have an advance directive covering medical treatment?: Patient has advance directive covering medical treatment, copy in chart.  Type of advance directive: : Portable DNR  Surrogate decision maker appointed:: No  Reason there is not a surrogate decision maker appointed:: Patient does not wish to appoint a surrogate decision maker at this time  Information provided on advance directive:: Yes  Patient requests assistance:: No    Advance Directive (Mental Health Treatment)  Does patient have an advance directive covering mental health treatment?:  (N/A)  Type of Residence: Mailing Address:  [address]  [place] [address]  Contacts: Accompanied by: Family member  Contact Details: Primary Contact  Primary Contact Name: Connye Burkitt  Primary Contact Relationship: Spouse  Phone #1: [phone]  Patient Phone Number: [phone] (home)         Medical Provider(s): Drinda Butts, MD  Reason for Admission: Admitting Diagnosis:  No admission diagnoses are documented for this encounter.  Past Medical History:   has a past medical history of Coronary artery disease; Disease of thyroid gland; Gout; Hyperlipidemia; Hypertension; Lupus nephritis (CMS-HCC); Red blood cell antibody positive; Renal cancer, right (CMS-HCC); and Renal transplant, status post.  Past Surgical History:   has a past surgical history that includes Transplantation renal; pr nephrectomy (Right, 02/20/2014); pr colonoscopy flx dx w/collj spec when pfrmd (N/A, 05/22/2016); AV fistula placement (Right); and pr breath hydrogen test (N/A, 06/24/2016).   Previous admit date: 08/28/2016    Primary Insurance- Payor: Advertising copywriter MEDICARE ADV / Plan: UNITED HEALTHCARE MEDICARE ADV / Product Type: *No Product type* /   Secondary Insurance ??? None  Prescription Coverage ??? Yes  Preferred Pharmacy - [facility] - [place], [place] - [address]  [facility] - [place], [place] - [address]  [facility] - [place],  - [address]    Transportation home: Private vehicle  Level of function prior to admission: Independent        Patient Information:    Lives with: Spouse/significant other    Type of Residence: Private residence        Location/Detail: [address] [place] [address]    Support Systems: Family Members, Friends/Neighbors, Spouse, Church/Faith Community (Patient is a Education officer, environmental)    Responsibilities/Dependents at home?: No    Home Care services in place prior to admission?: No  Outpatient/Community Resources in place prior to admission:  (NONE)       Equipment Currently Used at Home: none       Currently receiving outpatient dialysis?: Other (Comment) (Not at this time. Did have dialysis upto 2008 when he got his transplant)       Financial Information:          Need for financial assistance?: No       Discharge Needs Assessment:    Concerns to be Addressed: no discharge needs identified    Clinical Risk Factors: Multiple Diagnoses (Chronic) (Kidney Transplant in 2008)         Prior overnight hospital stay or ED visit in last 90 days: No    Readmission Within the Last 30 Days: no previous admission in last 30 days         Anticipated Changes Related to Illness: none    Equipment Needed After Discharge: none    Discharge Facility/Level of Care Needs:  (Home with Self Care)    Patient at risk for readmission?: Yes    Discharge Plan:    Screen findings are: Care Manager reviewed the plan of the patient's care with the Multidisciplinary Team. No discharge planning needs identified at this time. Care Manager will continue to manage plan and monitor patient's progress with the team.    Expected Discharge Date: 02/15/18    Expected Transfer from Critical Care:  (N/A)    Patient and/or family were provided with choice of facilities / services that are available and appropriate to meet post hospital care needs?: No       Initial Assessment complete?: Yes

## 2018-02-12 NOTE — Unmapped (Signed)
Problem: Patient Care Overview  Goal: Plan of Care Review  Outcome: Progressing  Pt is alert and oriented x4. Pt was able to ambulate in room independently, he is on droplet precaution, respiratory panel pending. pt is on RA, VSS. He still has  bad cough, but able to tolerate. Denied pain, pt family and friends came by visiting, pt is in good spirit. Will continue monitor.    Problem: Infection, Risk/Actual (Adult)  Goal: Identify Related Risk Factors and Signs and Symptoms  Related risk factors and signs and symptoms are identified upon initiation of Human Response Clinical Practice Guideline (CPG).   Outcome: Progressing      Problem: Fall Risk (Adult)  Goal: Absence of Fall  Patient will demonstrate the desired outcomes by discharge/transition of care.   Outcome: Progressing

## 2018-02-12 NOTE — Unmapped (Signed)
Vancomycin Initiation Pharmacy Note    Miguel Ward is a 67 y.o. male being initiated on vancomycin therapy for Pneumonia.    Goal trough: 15-20 mg/L    Pharmacokinetic Parameters:    Wt Readings from Last 1 Encounters:   02/11/18 79.4 kg (175 lb)     Lab Results   Component Value Date    CREATININE 2.41 (H) 02/11/2018      Vd = 56.4 L, ke = 0.031 hr-1    Recommended Dose: 1000 mg IV q24h    Estimated Peak: 34 mg/L  Estimated trough: 17 mg/L    Pharmacy will continue to monitor and order levels as appropriate.  Patient will be followed for changes in renal function, toxicity, and efficacy.  Please page service pharmacist with questions/clarifications.    Hollie Beach,  PharmD

## 2018-02-12 NOTE — Unmapped (Addendum)
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     New Grade 3 Diastolic Dysfunction, Systolic Dysfunction and pHTN. No prior transthoracic echocardiogram in record. Presented with four month history of orthopnea and lower extremity swelling not improving on home Lasix 20mg . A TTE showed moderate EF 40-45%, reduced from 52% reported on 12/2013 Myocardial Perfusion Test. Echo also showed moderate LVH with moderate to severe pulmonary artery systolic pressure. Estimated pulmonary artery systolic pressure: 69 mmHg. He was diuresed with Lasix 40mg  IV with improvement in swelling. Pro-BNP decreased from 16,000 to 9,000. He was on room air and saturating normally throughout the admission. Recommended he follow-up with his outpatient cardiologist at [facility] for management. He agreed to this plan.   --Lasix 40mg  daily (double pre-admission dose)    Type 2 NSTEMI. Troponin peaked to 0.271 with no EKG changes. Suspect demand ischemia to acute RSV infection.     Acute Kidney Injury. He was found to have RSV as well as an AKI on admission. Creatinine elevated to 2.73 from baseline Cr 2.1-2.2. Suspect his RSV infection acutely worsened his undiagnosed heart failure as above and that his AKI is pre-renal due to reduced perfusion. His Creatinine improved to 2.69 at discharge.     RSV: Recommended symptomatic management with cough suppressants.     History of kidney transplant: Follows with Dr. Posey Boyer True; deceased donor kidney transplant on 06/11/2007 secondary to lupus nephritis, right renal nephrectomy (02/20/2014) in the setting of renal cell carcinoma; patient reports that he has his transplanted kidney in his RLQ and a left kidney. The patient's most recent renal ultrasound was done 12/30/17 on the date of a renal biopsy and showed renal transplant in the right lower quadrant with no hydronephrosis or perinephric fluid. Surgical pathology collected 12/30/17 showed chronic changes in the allograft thought secondary to hypertension but no evidence of rejection per 01/17/18 pathology review  - Continue PO calcitriol 0.33mcg M/W/F  - Continue mycophenolate 250mg  BID  - Continue tacrolimus 3mg  Q12H  - Continue sodium bicarbonate 650 mg BID  - Continue acyclovir ppx    CAD s/p stent (2010 and HTN: Given his AKI, we held enalapril 5mg  BID. Recommend restarting when appropriate. Continue metoprolol succinate 100mg  QD    HLD: PO atorvastatin  Normocytic anemia: Continue PO ferrous sulfate 325 mg TID  Hypothyroidism: continue levothyroxine 225 mcg QD  GERD: PPI  Gout: PO allopurinol

## 2018-02-12 NOTE — Unmapped (Signed)
PVL's Completed - Patient brought to treatment room. Care assumed.

## 2018-02-12 NOTE — Unmapped (Signed)
PHYSICAL THERAPY  Evaluation (02/12/18 1040)     Patient Name:  Miguel Ward       Medical Record Number: [id number]   Date of Birth: 1951/01/07  Sex: Male            Treatment Diagnosis: pneumonia    ASSESSMENT    67 yo male admitted 02/11/18 with cough, wheezing and BLE edema. Diagnosed with R PNA and placed on droplet precautions. PMH is significnat for lupus nephritis, renal CA, kidney transplant 2008, CAD, HTN and gout.  He demonstrates no defecits in strength, balance, or endurance requiring skilled PT services at this time.  AM-PAC 24/24.  No follow up PT or DME needs.     Today's Interventions: Initial eval, education re: benefits of activity and OOB, role of PT, plan to dc from PT, energy conservation.  Pt performed standing ther ex x 10 bilat: mini-squats, hip abduction, toe raises.     Activity Tolerance: Patient tolerated treatment well    PLAN  Planned Frequency of Treatment:  D/C Services for: D/C Services      Planned Interventions:      Post-Discharge Physical Therapy Recommendations:  PT services not indicated        PT DME Recommendations: None     Goals:   Patient and Family Goals: to get back ministering                                                                                                        Prognosis:  Excellent  Barriers to Discharge: None  Positive Indicators: age, motivation, family and community support, PLOF    SUBJECTIVE  Patient reports: Pt and RN agreeable to therapy.  Current Functional Status: Sitting up in bed on arrival and at end of session; RN, Ginger, aware.  Call bell in reach.  Issued pt and donned TED hose after verifying with RN, due to LE edema.  Services patient receives: PT  Prior functional status: Pt reports independence with all mobility; no device. 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  Reports his only difficulty has been climbing multiple flights of stairs.   Equipment available at home: None    Past Medical History:   Diagnosis Date   ??? Coronary artery disease    ??? Disease of thyroid gland    ??? Gout    ??? Hyperlipidemia    ??? Hypertension    ??? Lupus nephritis (CMS-HCC)    ??? Red blood cell antibody positive     anti-K   ??? Renal cancer, right (CMS-HCC)    ??? Renal transplant, status post     Social History   Substance Use Topics   ??? Smoking status: Never Smoker   ??? Smokeless tobacco: Never Used   ??? Alcohol use No      Past Surgical History:   Procedure Laterality Date   ??? AV FISTULA PLACEMENT Right    ??? PR BREATH HYDROGEN TEST N/A 06/24/2016    Procedure: BREATH HYDROGEN TEST;  Surgeon: Nurse-Based Giproc;  Location[facility] [place];  Service: Gastroenterology   ??? PR COLONOSCOPY FLX DX W/COLLJ SPEC WHEN PFRMD N/A 05/22/2016    Procedure: COLONOSCOPY, FLEXIBLE, PROXIMAL TO SPLENIC FLEXURE; DIAGNOSTIC, W/WO COLLECTION SPECIMEN BY BRUSH OR WASH;  Surgeon: Liane Comber, MD;  [facility]: [facility];  Service: Gastroenterology   ??? PR NEPHRECTOMY Right 02/20/2014    Procedure: LAPAROSCOPY, SURGICAL; NEPHRECTOMY, INCLUDING PARTIAL URETERECTOMY;  Surgeon: Vivi Barrack, MD;  Location: MA[facility] OR [facility];  Service: Transplant   ??? TRANSPLANTATION RENAL      Family History   Problem Relation Age of Onset   ??? Heart disease Mother    ??? Cancer Father    ??? Cardiomyopathy Neg Hx    ??? Sudden Cardiac Death Neg Hx    ??? Melanoma Neg Hx    ??? Basal cell carcinoma Neg Hx    ??? Squamous cell carcinoma Neg Hx         Allergies: Penicillins                Objective Findings              Precautions: Falls;Isolation precautions (droplet precautions)              Weight Bearing Status: LLE WBAT - Weight bearing as tolerated              Required Braces or Orthoses: Non- applicable    Communication Preference: Verbal  Pain Comments: no c/o pain  Medical Tests / Procedures: Epic reviewed.   Equipment / Environment: Vascular access (PIV, TLC, Port-a-cath, PICC);Telemetry    At Rest: vss  With Activity: vss  Orthostatics: asymptomatic       Living environment: House Lives With: Spouse  Home Living: One level home;Stairs to enter with rails  Rail placement (outside): Rail on right side     Number of Stairs: 8    Cognition: A&Ox4  Visual / Perception Status: glasses  Skin Inspection: visualized areas intact    UE ROM: WFL  UE Strength: WFL  LE ROM: WFL  LE Strength: WFL                Coordination: intact      Sensation: intact throughout  Balance: sitting and standing/static an dynamic balance all modified independent.          Bed Mobility: modified indep with all aspects of bed mobility.   Transfers: sit to stand with modified indep, no device.   Gait: Pt ambulated unlimited distances in hospital room (as he is on droplet precautions) with modified independence and no device.    Stairs: NT secondary to pt's inability to leave room. Pt can, however, perform single leg squat x 5 with S (with UE support).     Wheelchair Mobility: NT  Endurance: on RA, no SOB noted    Eval Duration(PT): 20 Min.    Medical Staff Made Aware: RN, Ginger, aware.      I attest that I have reviewed the above information.  Signed: Mack Hook, PT  Filed 02/12/2018

## 2018-02-12 NOTE — Unmapped (Signed)
OCCUPATIONAL THERAPY  Evaluation (02/12/18 1426)    Patient Name:  Miguel Ward       Medical Record Number: [id number]   Date of Birth: [birth year]  Sex: Male          OT Treatment Diagnosis:  OT consulted for decreased ADL    Assessment  Pt  is a 67 y.o. male with a medical history that includes deceased donor kidney transplant on 06/11/2007 secondary to lupus nephritis, right renal nephrectomy (02/20/2014) in the setting of renal cell carcinoma, CAD, HTN, prior pericardial effusion, hypothyroidism and gout transferred 02/11/2018 to [facility] after presenting to [facility] ED with cough and wheezing, possibly secondary to community-acquired pneumonia and new onset CHF. Pt presents to OT eval at baseline for BADL and functional mobility without AD and reports good family support at home. OT evaluation completed with moderate complexity given pt's medical history, performance limitations, and clinical analysis required. Based on the daily activity AM-PAC raw score of 24/24, the pt is considered to be 0% impaired with self care. This indicates no skilled acute or post-acute OT services indicated at this time.    Activity Tolerance During Today's Session  Patient tolerated treatment well    Plan  Planned Frequency of Treatment:  D/C Services for: D/C Services     Post-Discharge Occupational Therapy Recommendations:  OT Post Acute Discharge Recommendations: OT services not indicated    OT DME Recommendations: None    Prognosis:  Good  Positive Indicators:  PLOF, CLOF, motivation  Barriers to Discharge: None    Subjective  Current Status Pt received supine in bed, left seated EOB with visitor present, RN aware.  Prior Functional Status Pt reports independence with BADL and functional mobility without AD, working as a Systems analyst, driving.        Services patient receives: PT  Patient / Caregiver reports: I can actually have a conversation without coughing today!    Past Medical History:   Diagnosis Date   ??? Coronary artery disease    ??? Disease of thyroid gland    ??? Gout    ??? Hyperlipidemia    ??? Hypertension    ??? Lupus nephritis (CMS-HCC)    ??? Red blood cell antibody positive     anti-K   ??? Renal cancer, right (CMS-HCC)    ??? Renal transplant, status post     Social History   Substance Use Topics   ??? Smoking status: Never Smoker   ??? Smokeless tobacco: Never Used   ??? Alcohol use No      Past Surgical History:   Procedure Laterality Date   ??? AV FISTULA PLACEMENT Right    ??? PR BREATH HYDROGEN TEST N/A 06/24/2016    Procedure: BREATH HYDROGEN TEST;  Surgeon: Nurse-Based Giproc;  [facility];  Service: Gastroenterology   ??? PR COLONOSCOPY FLX DX W/COLLJ SPEC WHEN PFRMD N/A 05/22/2016    Procedure: COLONOSCOPY, FLEXIBLE, PROXIMAL TO SPLENIC FLEXURE; DIAGNOSTIC, W/WO COLLECTION SPECIMEN BY BRUSH OR WASH;  Surgeon: Liane Comber, MD;  Location: [facility];  Service: Gastroenterology   ??? PR NEPHRECTOMY Right 02/20/2014    Procedure: LAPAROSCOPY, SURGICAL; NEPHRECTOMY, INCLUDING PARTIAL URETERECTOMY;  Surgeon: Vivi Barrack, MD;  Location: MAIN OR [facility];  Service: Transplant   ??? TRANSPLANTATION RENAL      Family History   Problem Relation Age of Onset   ??? Heart disease Mother    ??? Cancer Father    ??? Cardiomyopathy Neg Hx    ???  Sudden Cardiac Death Neg Hx    ??? Melanoma Neg Hx    ??? Basal cell carcinoma Neg Hx    ??? Squamous cell carcinoma Neg Hx         Penicillins     Objective Findings    Pain  No c/o pain    Equipment / Environment  Vascular access (PIV, TLC, Port-a-cath, PICC);Telemetry    Living Situation   Living environment: House   Lives With: Spouse   Home Living: One level home;Stairs to enter with rails;Tub/shower unit;Hand-held shower hose   Equipment available at home: None   Rail placement (outside): Rail on right side        Cognition   Orientation Level:  Oriented x 4   Arousal/Alertness:  Appropriate responses to stimuli     Vision / Perception  Vision: Wears glasses all the time  Perception: Appears WFL       Hand Function  Hand Dominance: Right  Gout deformities in MCP joints of thumb bilaterally; grip strength WFL    Skin Inspection  Visible skin intact    Sensation:  Denies N/T     Balance:  Independent for dynamic sitting and standing balance    Mobility/Gait/Transfers: Independent for sup>EOB and sit to stand transfer    ADL:  Bathing: Anticipate Independent  Grooming: Independent  Dressing: Pt donned/doffed B socks seated EOB Independently  Eating: Independent  Toileting: Independent     Interventions Performed During Today's Session: AMPAC 24/24; Pt edu on OT role, POC, energy conservation techniques (seated ADL, pacing, rest breaks), avoid bending over, importance of daily weights with CHF and use of calendar/notebook for tracking, elevation and ambulation for BLE for decreased edema, compensatory tehcniques for donning TED hose (use of plastic bag over leg first to create more slippery surface for easier donning and pulling through hole on bottom to remove.     Eval Duration (OT): 17 Min.    Medical Staff Made Aware: RN    I attest that I have reviewed the above information.  Signed: [name], OT  Ceasar Mons 02/12/2018

## 2018-02-12 NOTE — Unmapped (Signed)
Urinal placed at bedside. Family updated. Breathing treatment in process

## 2018-02-12 NOTE — Unmapped (Signed)
Report given to [employer].

## 2018-02-12 NOTE — Unmapped (Signed)
Problem: Patient Care Overview  Goal: Plan of Care Review  Outcome: Not Progressing      Problem: Fall Risk (Adult)  Goal: Absence of Fall  Patient will demonstrate the desired outcomes by discharge/transition of care.  Outcome: Progressing      Comments: Pt sleeping at this time.  No C/O pain at this time.  Pt remains afebrile at this time. Medication given per Dr orders. Telemetry placed on pt per Dr order.  Labs to be gathered and sent.  Pt A/O person, place, time, event.  Pt +4 edema bilat, pt states when he goes to bed and places legs up edema goes down.  Will continue to monitor

## 2018-02-12 NOTE — Unmapped (Signed)
Report given to [facility] of  3 WST.

## 2018-02-13 LAB — CBC W/ AUTO DIFF
BASOPHILS ABSOLUTE COUNT: 0 10*9/L (ref 0.0–0.1)
BASOPHILS RELATIVE PERCENT: 0.3 %
EOSINOPHILS ABSOLUTE COUNT: 0.2 10*9/L (ref 0.0–0.4)
EOSINOPHILS RELATIVE PERCENT: 5.9 %
HEMATOCRIT: 33.3 % — ABNORMAL LOW (ref 41.0–53.0)
LARGE UNSTAINED CELLS: 7 % — ABNORMAL HIGH (ref 0–4)
LYMPHOCYTES ABSOLUTE COUNT: 0.6 10*9/L — ABNORMAL LOW (ref 1.5–5.0)
LYMPHOCYTES RELATIVE PERCENT: 17.9 %
MEAN CORPUSCULAR HEMOGLOBIN CONC: 31.5 g/dL (ref 31.0–37.0)
MEAN CORPUSCULAR HEMOGLOBIN: 28.6 pg (ref 26.0–34.0)
MEAN CORPUSCULAR VOLUME: 90.6 fL (ref 80.0–100.0)
MEAN PLATELET VOLUME: 10 fL (ref 7.0–10.0)
MONOCYTES ABSOLUTE COUNT: 0.3 10*9/L (ref 0.2–0.8)
MONOCYTES RELATIVE PERCENT: 9.2 %
NEUTROPHILS RELATIVE PERCENT: 59.7 %
PLATELET COUNT: 91 10*9/L — ABNORMAL LOW (ref 150–440)
RED BLOOD CELL COUNT: 3.68 10*12/L — ABNORMAL LOW (ref 4.50–5.90)
RED CELL DISTRIBUTION WIDTH: 14.9 % (ref 12.0–15.0)
WBC ADJUSTED: 3.6 10*9/L — ABNORMAL LOW (ref 4.5–11.0)

## 2018-02-13 LAB — PHOSPHORUS
PHOSPHORUS: 2.5 mg/dL — ABNORMAL LOW (ref 2.9–4.7)
Phosphate:MCnc:Pt:Ser/Plas:Qn:: 2.5 — ABNORMAL LOW

## 2018-02-13 LAB — COMPREHENSIVE METABOLIC PANEL
ALBUMIN: 2.6 g/dL — ABNORMAL LOW (ref 3.5–5.0)
ALKALINE PHOSPHATASE: 68 U/L (ref 38–126)
ALT (SGPT): 33 U/L (ref 19–72)
AST (SGOT): 34 U/L (ref 19–55)
BLOOD UREA NITROGEN: 42 mg/dL — ABNORMAL HIGH (ref 7–21)
BUN / CREAT RATIO: 15
CALCIUM: 8.9 mg/dL (ref 8.5–10.2)
CHLORIDE: 103 mmol/L (ref 98–107)
CO2: 26 mmol/L (ref 22.0–30.0)
CREATININE: 2.73 mg/dL — ABNORMAL HIGH (ref 0.70–1.30)
EGFR MDRD AF AMER: 29 mL/min/{1.73_m2} — ABNORMAL LOW (ref >=60)
EGFR MDRD NON AF AMER: 24 mL/min/{1.73_m2} — ABNORMAL LOW (ref >=60)
GLUCOSE RANDOM: 80 mg/dL (ref 65–179)
POTASSIUM: 3.8 mmol/L (ref 3.5–5.0)
PROTEIN TOTAL: 5.1 g/dL — ABNORMAL LOW (ref 6.5–8.3)
SODIUM: 139 mmol/L (ref 135–145)

## 2018-02-13 LAB — ANION GAP: Anion gap 3:SCnc:Pt:Ser/Plas:Qn:: 10

## 2018-02-13 LAB — MAGNESIUM: Magnesium:MCnc:Pt:Ser/Plas:Qn:: 1.3 — ABNORMAL LOW

## 2018-02-13 LAB — BASOPHILS RELATIVE PERCENT: Lab: 0.3

## 2018-02-13 LAB — SMEAR REVIEW

## 2018-02-13 NOTE — Unmapped (Signed)
Problem: Patient Care Overview  Goal: Plan of Care Review  Outcome: Progressing    Goal: Discharge Needs Assessment  Outcome: Progressing      Problem: Infection, Risk/Actual (Adult)  Goal: Identify Related Risk Factors and Signs and Symptoms  Related risk factors and signs and symptoms are identified upon initiation of Human Response Clinical Practice Guideline (CPG).   Outcome: Progressing      Problem: Fall Risk (Adult)  Goal: Absence of Fall  Patient will demonstrate the desired outcomes by discharge/transition of care.   Outcome: Progressing      Comments: Pt sleeping at this time.  Pt remains afebrile, no falls, droplet precautions still maintained.  Call bell within reach, bed lowest level, will continue to monitor

## 2018-02-14 DIAGNOSIS — C649 Malignant neoplasm of unspecified kidney, except renal pelvis: Principal | ICD-10-CM

## 2018-02-14 LAB — CBC W/ AUTO DIFF
BASOPHILS ABSOLUTE COUNT: 0 10*9/L (ref 0.0–0.1)
BASOPHILS RELATIVE PERCENT: 0.6 %
EOSINOPHILS ABSOLUTE COUNT: 0.3 10*9/L (ref 0.0–0.4)
EOSINOPHILS RELATIVE PERCENT: 8.2 %
HEMATOCRIT: 36.3 % — ABNORMAL LOW (ref 41.0–53.0)
HEMOGLOBIN: 11 g/dL — ABNORMAL LOW (ref 13.5–17.5)
LYMPHOCYTES RELATIVE PERCENT: 24.9 %
MEAN CORPUSCULAR HEMOGLOBIN CONC: 30.3 g/dL — ABNORMAL LOW (ref 31.0–37.0)
MEAN CORPUSCULAR HEMOGLOBIN: 27.3 pg (ref 26.0–34.0)
MEAN CORPUSCULAR VOLUME: 90.2 fL (ref 80.0–100.0)
MEAN PLATELET VOLUME: 10.3 fL — ABNORMAL HIGH (ref 7.0–10.0)
MONOCYTES ABSOLUTE COUNT: 0.3 10*9/L (ref 0.2–0.8)
MONOCYTES RELATIVE PERCENT: 7 %
NEUTROPHILS ABSOLUTE COUNT: 2.1 10*9/L (ref 2.0–7.5)
NEUTROPHILS RELATIVE PERCENT: 52.3 %
PLATELET COUNT: 105 10*9/L — ABNORMAL LOW (ref 150–440)
RED BLOOD CELL COUNT: 4.03 10*12/L — ABNORMAL LOW (ref 4.50–5.90)
RED CELL DISTRIBUTION WIDTH: 14.9 % (ref 12.0–15.0)
WBC ADJUSTED: 4.1 10*9/L — ABNORMAL LOW (ref 4.5–11.0)

## 2018-02-14 LAB — COMPREHENSIVE METABOLIC PANEL
ALBUMIN: 2.7 g/dL — ABNORMAL LOW (ref 3.5–5.0)
ALKALINE PHOSPHATASE: 66 U/L (ref 38–126)
ALT (SGPT): 37 U/L (ref 19–72)
ANION GAP: 12 mmol/L (ref 9–15)
AST (SGOT): 34 U/L (ref 19–55)
BILIRUBIN TOTAL: 1 mg/dL (ref 0.0–1.2)
BUN / CREAT RATIO: 16
CALCIUM: 8.9 mg/dL (ref 8.5–10.2)
CHLORIDE: 99 mmol/L (ref 98–107)
CO2: 26 mmol/L (ref 22.0–30.0)
CREATININE: 2.69 mg/dL — ABNORMAL HIGH (ref 0.70–1.30)
EGFR MDRD NON AF AMER: 24 mL/min/{1.73_m2} — ABNORMAL LOW (ref >=60)
GLUCOSE RANDOM: 73 mg/dL (ref 65–179)
POTASSIUM: 3.7 mmol/L (ref 3.5–5.0)
PROTEIN TOTAL: 5.2 g/dL — ABNORMAL LOW (ref 6.5–8.3)
SODIUM: 137 mmol/L (ref 135–145)

## 2018-02-14 LAB — TACROLIMUS, TROUGH: Lab: 9.4

## 2018-02-14 LAB — MONOCYTES ABSOLUTE COUNT: Lab: 0.3

## 2018-02-14 LAB — ALBUMIN: Albumin:MCnc:Pt:Ser/Plas:Qn:: 2.7 — ABNORMAL LOW

## 2018-02-14 LAB — MAGNESIUM
MAGNESIUM: 1.3 mg/dL — ABNORMAL LOW (ref 1.6–2.2)
Magnesium:MCnc:Pt:Ser/Plas:Qn:: 1.3 — ABNORMAL LOW

## 2018-02-14 LAB — PHOSPHORUS: Phosphate:MCnc:Pt:Ser/Plas:Qn:: 2.5 — ABNORMAL LOW

## 2018-02-14 LAB — PRO-BNP: Natriuretic peptide.B prohormone N-Terminal:MCnc:Pt:Ser/Plas:Qn:: 9110 — ABNORMAL HIGH

## 2018-02-14 MED ORDER — CODEINE 10 MG-GUAIFENESIN 100 MG/5 ML ORAL LIQUID
Freq: Three times a day (TID) | ORAL | 0 refills | 0 days | Status: CP | PRN
Start: 2018-02-14 — End: 2018-02-19

## 2018-02-14 MED ORDER — FUROSEMIDE 40 MG TABLET
ORAL_TABLET | Freq: Every day | ORAL | 0 refills | 0.00000 days | Status: CP
Start: 2018-02-14 — End: 2018-04-04

## 2018-02-14 MED ORDER — MAGNESIUM OXIDE 400 MG (241.3 MG MAGNESIUM) TABLET
ORAL_TABLET | Freq: Two times a day (BID) | ORAL | 1 refills | 0 days | Status: CP
Start: 2018-02-14 — End: 2018-07-01

## 2018-02-14 NOTE — Unmapped (Signed)
Problem: Patient Care Overview  Goal: Plan of Care Review  Outcome: Not Progressing    Goal: Discharge Needs Assessment  Outcome: Progressing      Problem: Infection, Risk/Actual (Adult)  Goal: Identify Related Risk Factors and Signs and Symptoms  Related risk factors and signs and symptoms are identified upon initiation of Human Response Clinical Practice Guideline (CPG).   Outcome: Progressing      Problem: Fall Risk (Adult)  Goal: Absence of Fall  Patient will demonstrate the desired outcomes by discharge/transition of care.   Outcome: Progressing      Problem: Fluid Volume Excess (Adult)  Goal: Identify Related Risk Factors and Signs and Symptoms  Related risk factors and signs and symptoms are identified upon initiation of Human Response Clinical Practice Guideline (CPG).   Outcome: Not Progressing      Problem: VTE, DVT and PE (Adult)  Goal: Signs and Symptoms of Listed Potential Problems Will be Absent, Minimized or Managed (VTE, DVT and PE)  Signs and symptoms of listed potential problems will be absent, minimized or managed by discharge/transition of care (reference VTE, DVT and PE (Adult) CPG).   Outcome: Progressing      Comments: Pt sleeping at this time, noted significant wheezing and rhonchi posterior lung sounds bilaterally.  Nurse encouraged pt to deep breath and cough along with incentive spirometer use, pt sats 94-95% room air.  No falls this shift, Pt given Heparin SQ TID per Dr order. Droplet precautions still maintained.  In take, out put monitored.  Pt remains afebrile, bed at lowest level, call within reach.  Will continue to monitor

## 2018-02-14 NOTE — Unmapped (Signed)
Nephrology (MDB) Progress Note      Assessment/Plan:  Miguel Ward is a 67 y.o. man with a history of deceased donor??kidney transplant on 06/11/2007 secondary to lupus nephritis, right renal nephrectomy (02/20/2014) in the setting of renal cell carcinoma, CAD, HTN, prior pericardial effusion, hypothyroidism and gout who presented with cough and wheezing.     Principal Problem:    Pneumonia due to infectious organism  Active Problems:    Hypercholesterolemia    Hypertension    History of kidney transplant    Immunosuppression-related infectious disease    Elevated brain natriuretic peptide (BNP) level    Bilateral lower extremity edema    Elevated troponin I level    Normocytic anemia    Gout    GERD (gastroesophageal reflux disease)  Resolved Problems:    * No resolved hospital problems. *      RSV Infection  Presented with dyspnea and productive cough with diffuse wheezing on exam. CXR with pulmonary edema. Respiratory viral panel positive for RSV. Suspect symptoms are secondary to viral infection, but also concerned for underlying heart failure given longer symptoms of LE edema/orthopnea and pro-BNP 16,400.   - s/p Vancomycin and Cefepime on arrival   - Follow up blood cultures - NGTD  - Supportive treatment with robitussin, albuterol  - Lasix 40 IV BID  - Echo to rule out cardiac etiology that could explain possibly longer-standing orthopnea/LE edema  - Azithromycin x 5 days to cover for atypical pneumonia    Type II NSTEMI  Troponin 0.12 on arrival, peaking at 0.271. EKG without ischemic changes.Patient carries a prior diagnosis of coronary atherosclerosis of native coronary artery per EPIC review, but no cath reports available.   - Echo as above  - Home aspirin, atorvastatin     S/p Renal Transplant  Follows with Dr. Carlene Coria; deceased donor??kidney transplant on 06/11/2007 secondary to lupus nephritis, right renal nephrectomy (02/20/2014) in the setting of renal cell carcinoma; patient reports that he has his transplanted kidney in his RLQ and a left kidney; admission creatinine (02/11/18) of 2.41 appears to be within the patient's baseline range from 2.0 - 3.0 from 01/2015 - 01/2018.  - Continue PO calcitriol 0.25 mcg M/W/F  - Continue mycophenolate 250 mg BID  - Continue tacrolimus 3 mg Q12H  - Continue sodium bicarbonate 650 mg BID  - Continue acyclovir ppx    Hypertension  - Home enalapril 5, metoprolol succinate 100    Normocytic Anemia  - Home ferrous sulfate    Hypothyroidism   - Home levothyroxine 225 daily    Gout  - Home allopurinol   ____________________________________________________    Subjective:    No acute events overnight. Still having coughing spells. Not sure if albuterol helps.     Objective:    Temp:  [36 ??C-37.9 ??C] 37.1 ??C  Heart Rate:  [70-86] 70  Resp:  [16-20] 18  BP: (138-145)/(67-81) 142/76  SpO2:  [92 %-96 %] 93 %,   Intake/Output Summary (Last 24 hours) at 02/13/18 1613  Last data filed at 02/13/18 1400   Gross per 24 hour   Intake              710 ml   Output             1400 ml   Net             -690 ml       General: well-appearing, no acute distress  HEENT: EOMI, anicteric sclera  CV: RRR, no m/r/g  Lungs: Diffuse bilateral wheezing with bibasilar rales  Abd: soft, non-tender, non-distended, +bs  Extremities: no edema  Skin: no visible lesions or rashes   Psych: alert, engaged, appropriate mood and affect    Labs/Studies:  Labs and Studies from the last 24hrs per EMR and Reviewed

## 2018-02-14 NOTE — Unmapped (Signed)
Problem: Patient Care Overview  Goal: Plan of Care Review  Outcome: Progressing  Pt is alert and oriented x4. Pt was able to ambulate in room independently, he is on droplet and contact precaution. pt is on RA, VSS. He still has some cough, but able to tolerate. Denied pain, pt family and friends came by visiting, pt is in good spirit. Will continue monitor.    Problem: Infection, Risk/Actual (Adult)  Goal: Identify Related Risk Factors and Signs and Symptoms  Related risk factors and signs and symptoms are identified upon initiation of Human Response Clinical Practice Guideline (CPG).   Outcome: Progressing      Problem: Fall Risk (Adult)  Goal: Absence of Fall  Patient will demonstrate the desired outcomes by discharge/transition of care.   Outcome: Progressing      Problem: VTE, DVT and PE (Adult)  Goal: Signs and Symptoms of Listed Potential Problems Will be Absent, Minimized or Managed (VTE, DVT and PE)  Signs and symptoms of listed potential problems will be absent, minimized or managed by discharge/transition of care (reference VTE, DVT and PE (Adult) CPG).   Outcome: Progressing

## 2018-02-15 NOTE — Unmapped (Signed)
Physician Discharge Summary    Identifying Information:   Miguel Ward  23-Aug-1951  [id number]    Admit date: 02/11/2018    Discharge date: 02/14/2018     Discharge Service: Nephrology (MDB)    Discharge Attending Physician: Jackey Loge, MD    Discharge to: Home    Discharge Diagnoses:  Principal Problem:    RSV (respiratory syncytial virus infection)  Active Problems:    History of kidney transplant    Elevated troponin I level    Diastolic heart failure (CMS-HCC)    Systolic heart failure (CMS-HCC)    NSTEMI (non-ST elevated myocardial infarction) (CMS-HCC)  Resolved Problems:    * No resolved hospital problems. 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     New Grade 3 Diastolic Dysfunction, Systolic Dysfunction and pHTN. No prior transthoracic echocardiogram in record. Presented with four month history of orthopnea and lower extremity swelling not improving on home Lasix 20mg . A TTE showed moderate EF 40-45%, reduced from 52% reported on 12/2013 Myocardial Perfusion Test. Echo also showed moderate LVH with moderate to severe pulmonary artery systolic pressure. Estimated pulmonary artery systolic pressure: 69 mmHg. He was diuresed with Lasix 40mg  IV with improvement in swelling. Pro-BNP decreased from 16,000 to 9,000. He was on room air and saturating normally throughout the admission. Recommended he follow-up with his outpatient cardiologist at [facility] for management. He agreed to this plan.   --Lasix 40mg  daily (double pre-admission dose)    Type 2 NSTEMI. Troponin peaked to 0.271 with no EKG changes. Suspect demand ischemia to acute RSV infection.     Acute Kidney Injury. He was found to have RSV as well as an AKI on admission. Creatinine elevated to 2.73 from baseline Cr 2.1-2.2. Suspect his RSV infection acutely worsened his undiagnosed heart failure as above and that his AKI is pre-renal due to reduced perfusion. His Creatinine improved to 2.69 at discharge.     RSV: Recommended symptomatic management with cough suppressants.     History of kidney transplant: Follows with Dr. Posey Boyer True; deceased donor  kidney transplant on 06/11/2007 secondary to lupus nephritis, right renal nephrectomy (02/20/2014) in the setting of renal cell carcinoma; patient reports that he has his transplanted kidney in his RLQ and a left kidney. The patient's most recent renal ultrasound was done 12/30/17 on the date of a renal biopsy and showed renal transplant in the right lower quadrant with no hydronephrosis or perinephric fluid. Surgical pathology collected 12/30/17 showed chronic changes in the allograft thought secondary to hypertension but no evidence of rejection per 01/17/18 pathology review  - Continue PO calcitriol 0.19mcg M/W/F  - Continue mycophenolate 250mg  BID  - Continue tacrolimus 3mg  Q12H  - Continue sodium bicarbonate 650 mg BID  - Continue acyclovir ppx    CAD s/p stent (2010 and HTN: Given his AKI, we held  enalapril 5mg  BID. Recommend restarting when appropriate. Continue metoprolol succinate 100mg  QD    HLD: PO atorvastatin  Normocytic anemia: Continue PO ferrous sulfate 325 mg TID  Hypothyroidism: continue levothyroxine 225 mcg QD  GERD: PPI  Gout: PO allopurinol      Procedures:  No admission procedures for hospital encounter. ______________________________________________________________________    Discharge Day Services:  BP 159/82  - Pulse 71  - Temp 36.3 ??C (Oral)  - Resp 18  - Ht 182.9 cm (6')  - Wt 78.3 kg (172 lb 9.9 oz)  - SpO2 96%  - BMI 23.41 kg/m??   Pt seen on the day of discharge and determined appropriate for discharge.    Condition at Discharge: stable  ______________________________________________________________________  Discharge Medications:     Your Medication List      STOP taking these medications    enalapril 5 MG tablet  Commonly known as:  VASOTEC        START taking these medications    codeine-guaifenesin 10-100 mg/5 mL liquid  Commonly known as:  GUAIFENESIN AC  Take 5 mL by mouth Three (3) times a day as needed for cough. for up to 5 days     magnesium oxide 400 mg (241.3 mg magnesium) tablet  Commonly known as:  MAG-OX  Take 1 tablet (400 mg total) by mouth Two (2) times a day.        CHANGE how you take these medications    furosemide 40 MG tablet  Commonly known as:  LASIX  Take 1 tablet (40 mg total) by mouth daily.  What changed:  how much to take        CONTINUE taking these medications    acyclovir 400 MG tablet  Commonly known as:  ZOVIRAX  Take 1 tablet (400 mg total) by mouth daily.     allopurinol 100 MG tablet  Commonly known as:  ZYLOPRIM  Take 1.5 tablets (150 mg total) by mouth daily.     ASPIRIN LOW-STRENGTH 81 MG chewable tablet  Generic drug:  aspirin  Chew 81 mg daily.     atorvastatin 20 MG tablet  Commonly known as:  LIPITOR  Take 1 tablet (20 mg total) by mouth daily.     calcitriol 0.25 MCG capsule  Commonly known as:  ROCALTROL  Take 1 capsule (0.25 mcg total) by mouth Every Monday, Wednesday, and Friday.     CELLCEPT 250 mg capsule  Generic drug:  mycophenolate  Take 1 capsule (250 mg total) by mouth Two (2) times a day.     colchicine 0.6 mg tablet  Commonly known as:  COLCRYS  Take 1 tablet (0.6 mg total) by mouth daily as needed. for up to 1 dose     ferrous sulfate 325 (65 FE) MG tablet  Take 325 mg by mouth 3 (three) times a day.     fluticasone 50 mcg/actuation nasal spray  Commonly known as:  FLONASE  1 spray into each nostril daily as needed.     levothyroxine 25 MCG tablet  Commonly known as:  SYNTHROID, LEVOTHROID  Take 1 tab with one tab, total dose daily     levothyroxine 200 MCG tablet  Commonly known as:  SYNTHROID  Take 1 tab with one tab, total dose daily     metoprolol succinate 100 MG 24 hr tablet  Commonly known as:  TOPROL-XL  Take 1 tablet (100 mg total) by mouth daily.     omeprazole 20  MG capsule  Commonly known as:  PriLOSEC  Take 1 capsule (20 mg total) by mouth daily.     PROGRAF 1 MG capsule  Generic drug:  tacrolimus  Take 3 capsules (3 mg total) by mouth Two (2) times a day.     simethicone 80 MG chewable tablet  Commonly known as:  GAS-X  Chew 1 tablet (80 mg total) every six (6) hours as needed for flatulence.     sodium bicarbonate 650 mg tablet  Take 1 tablet (650 mg total) by mouth Two (2) times a day.          ______________________________________________________________________  Pending Test Results (if blank, then none):   Order Current Status    Blood Culture, Adult Preliminary result    Blood Culture, Adult Preliminary result          Most Recent Labs:  Microbiology Results (last day)     ** No results found for the last 24 hours. **          Lab Results   Component Value Date    WBC 4.1 (L) 02/14/2018    HGB 11.0 (L) 02/14/2018    HCT 36.3 (L) 02/14/2018    PLT 105 (L) 02/14/2018       Lab Results   Component Value Date    NA 137 02/14/2018    K 3.7 02/14/2018    CL 99 02/14/2018    CO2 26.0 02/14/2018    BUN 44 (H) 02/14/2018    CREATININE 2.69 (H) 02/14/2018    CALCIUM 8.9 02/14/2018    MG 1.3 (L) 02/14/2018    PHOS 2.5 (L) 02/14/2018       Lab Results   Component Value Date    ALKPHOS 66 02/14/2018    BILITOT 1.0 02/14/2018    BILIDIR 0.40 08/30/2016    PROT 5.2 (L) 02/14/2018    ALBUMIN 2.7 (L) 02/14/2018    ALT 37 02/14/2018    AST 34 02/14/2018    GGT 30 01/26/2014       Lab Results   Component Value Date    PT 15.9 (H) 02/12/2018    INR 1.39 02/12/2018    APTT 32.7 12/30/2017     Hospital Radiology:  Xr Chest 2 Views    Result Date: 02/11/2018  EXAM: XR CHEST 2 VIEWS DATE: 02/11/2018 2:57 PM ACCESSION: [id number] UN DICTATED: 02/11/2018 3:07 PM INTERPRETATION LOCATION: Main [facility] CLINICAL INDICATION: 68 years old Male with COUGH-  COMPARISON: 08/28/2016 TECHNIQUE: PA and Lateral Chest Radiographs. FINDINGS: Increased bilateral reticular opacity. No pleural effusion or pneumothorax. Enlarged cardiac silhouette unchanged.     Similar cardiomegaly with interval pulmonary edema.    Echocardiogram W Colorflow Spectral Doppler With Contrast    Result Date: 02/14/2018  ?? Ultrasound enhancing agent utilized to improve endocardial border definition ?? Left ventricular hypertrophy - moderate ?? Mildly to moderately decreased left ventricular systolic function, ejection fraction 40 to 45% ?? Diastolic dysfunction - grade III (severely elevated filling pressures) ?? Degenerative mitral valve disease ?? Mitral regurgitation - mild ?? Dilated left atrium - moderate ?? Dilated ascending aorta ?? Dilated right ventricle - mild ?? Normal right ventricular systolic function ?? Tricuspid regurgitation - mild to moderate ?? Elevated pulmonary artery systolic pressure - moderate to severe ?? Dilated right atrium - mild ?? Elevated right atrial pressure      Pvl Venous Duplex Lower Extremity Right    Result Date: 02/11/2018   Peripheral Vascular Lab     [address]   [place], [place] [address]  PVL VENOUS DUPLEX LOWER EXTREMITY RIGHT Patient Demographics Pt. Name: HESSTON HITCHENS Location: Emergency Department MRN:      [id number]        Sex:      M DOB:      [birth year]     Age:      4 years  Study Information Authorizing         [id number] AMBER COX      Performed Time       02/11/2018 4:10:51 Provider Name       RAFFERTY                                   PM Ordering Physician  AMBER RAFFERTY        Patient Location     [facility] Clinic Accession Number    [id number] UN         Technologist         Sam Clymer Diagnosis:                                Assisting                                           Technologist Ordered Reason For Exam: r/o DVT Indication: Swelling Protocol The major deep veins from the inguinal ligament to the ankle are assessed for compressibility and spectral Doppler flow characteristics on the requested limb. The assessed veins include common femoral vein, femoral vein in the thigh, popliteal vein, and intramuscular calf veins. The iliac vein is assessed indirectly using Doppler waveform analysis. The great saphenous vein is assessed for compressibility at the saphenofemoral junction, and the small saphenous vein assessed for compressibility behind the knee. A contralateral PW Doppler waveform is obtained for comparison.  Right Duplex Findings All veins visualized appear fully compressible. Doppler flow signals demonstrate normal spontaneity, phasicity, and augmentation.  Left Duplex Findings CFV Doppler waveform appears appropriately phasic.  Right Technical Summary No evidence of deep venous obstruction in the lower extremity. No indirect evidence of obstruction proximal to the inguinal ligament.  Left Technical Summary No evidence of iliofemoral obstruction.  Preliminary      ______________________________________________________________________    Discharge Instructions:   Activity Instructions     Activity as tolerated       Activity as tolerated             Diet Instructions     Discharge diet (specify)       Discharge Nutrition Therapy:  Heart Healthy              Follow Up instructions and Outpatient Referrals     Call MD for:  difficulty breathing, headache or visual disturbances       Call MD for:  hives       Call MD for:  persistent dizziness or light-headedness       Call MD for:  persistent nausea or vomiting       Call MD for:  redness, tenderness, or signs of infection (pain, swelling, redness, odor or green/yellow discharge around incision site)       Call MD for:  severe uncontrolled pain       Call MD  for:  temperature >38.5 Celsius       Discharge instructions       You were hospitalized for a Respiratory syncytial virus infection. We expect your symptoms to improve over the next two weeks with conservative management. If they do not improve or worsen, then please call your doctor. You might develop a secondary bacterial pneumonia that requires antibiotic treatment. For your cough, we were recommend over the counter Robitussin -DM. Please take this around the clock as written on the bottle to improve your symptoms. If this does not help, then we prescribed Guafenesin with codeine cough syrup, but your insurance may not cover this. Please only take one of these options.     Please call your nephrologist to request a hospital follow-up appointment within two weeks of discharge. Please have your labs checked, including your kidney function (Creatinine) and Tacrolimus level. Please hold your Lisinopril blood pressure medication until your kidney number improves. Your doctor will follow-up with you regarding your echocardiogram results.     HOLD: Lisinopril until kidney function improves  START: Lasix 40mg  daily   START: Magnesium 400mg  daily supplements (due to Tacrolimus reducing Magnesium levels)               Appointments which have been scheduled for you    May 20, 2018 10:15 AM EDT  (Arrive by 10:00 AM)  LAB ONLY with LAB WALK-IN ACC  LAB PHLEB 1ST [facility] REGION) [address]  [place] [address]  [phone]   May 20, 2018 10:40 AM EDT  (Arrive by 10:25 AM)  RETURN NEPHROLOGY POST with Brayton Caves, MD  [facility] KIDNEY TRANSPLANT [facility] [address] [facility] REGION) [address]  [place] [address]  [phone]   May 20, 2018 12:15 PM EDT  (Arrive by 11:45 AM)  XR CHEST PA AND LATERAL with Toney Reil RM 10  IMG DIAG X-RAY [facility]) [address]  [place] [address]  [phone]   May 20, 2018 12:30 PM EDT  (Arrive by 12:00 PM)  MRI ABDOMEN W WO CONTRA    -UN with [facility] MRI RM 2  IMG MRI [facility]) [address]  [place] [address]  [phone]   On appt date: Bring recent lab work Bring documentation of any metal object implants Take meds as usual Check w/physician if diabetic You will be asked to change into a gown for your safety  On appt date do not: Consume anything 2 hrs Wear metallic items including jewelry (we are not responsible for lost items)  Let us know if pt: Claustrophobic Metal object implant Pregnant Prescribed a sedative On dialysis Allergic to MRI dye/contrast Kidney Failure  (Title:MRIWCNTRST)          Length of Discharge: I spent greater than 30 mins in the discharge of this patient.

## 2018-02-15 NOTE — Unmapped (Signed)
Pharmacist Discharge Note  Patient Name: Miguel Ward  Reason for admission: RSV infection, AKI and new diastolic and systolic heart failure  Reason for writing this note: patient requires medication-related outpatient intervention and/or monitoring    Highlighted medication changes:  - Started Magnesium 400mg  qd (due to tacrolimus induced depletion)  - Changed Furosemide to 40mg  qd (increase in dose)  - Stopped Enalapril due to AKI     Continue Tacrolimus at 3mg  bid, but needs recheck as trough level elevated on day of discharge. Goal 4-6 with resulted trough = 9.4. (not changed as had been running levels within range).     Medication access/Adherence:  - No barriers identified    Outpatient follow-up:  [ ]  Tacrolimus trough, Cr and electrolytes, consideration of restarting enalapril if Cr nears baseline of 2.1;     Andres Shad Reno Clasby   Clinical Pharmacist    Future Appointments  Date Time Provider Department Center   05/20/2018 10:15 AM LAB WALK-IN ACC [facility]   05/20/2018 10:40 AM Posey Boyer True, MD [facility]   05/20/2018 12:15 PM [facility] DIAG RM 10 [facility] [place]   05/20/2018 12:30 PM [facility] MRI RM 2 [facility]

## 2018-02-23 MED ORDER — PREDNISONE 5 MG TABLET
ORAL_TABLET | Freq: Every day | ORAL | 3 refills | 0 days | Status: CP
Start: 2018-02-23 — End: 2018-12-19

## 2018-02-23 NOTE — Unmapped (Signed)
Cardiology Clinic Followup Note    Referring Provider: Pcp, None Per Patient   Primary Provider: Drinda Butts, MD     Reason for Visit:  Hospital discharge f/u    Assessment & Plan:  Miguel Ward is a 67yo M with h/o kidney transplant 05/2007 for lupus nephritis, hypertension, HLD, and CAD s/p PCI to mLAD who is here for hospital discharge f/u.    #Combined systolic and diastolic HF  -Most recent TTE from 02/14/18 with globally, mildly decreased EF 40-45% and grade III diastolic dysfunction (compared to prior TTE in 2009 which showed mild LVH and normal systolic and diastolic function). He did have evidence of volume overload at the time, and this may have been multifactorial given his kidney dysfunction as well. Fortunately, he improved with diuresis and is now doing very well. He has trace pretibial edema but no symptoms of decompensated heart failure. NYHA class I-II. I suspect the etiology of his heart failure is more hypertensive heart disease given his global dysfunction, LVH, and diastolic dysfunction; he also has not had any ischemic symptoms. However, given his CAD hx, ischemia is something to consider. I would like to continue to optimize him more before we pursue any further evaluation of that sort.   - Continue toprol XL 100mg  daily.  - Enalapril currently on hold due to AKI, but ideally would like to re-start this medication as soon as possible.  - Continue lasix 40mg  daily. I also advised him to weigh himself everyday at the same time in minimal clothing, and if he notices worsening symptoms or increase in weight by 3lbs or more, he should take an extra dose of lasix and call me.   - He has an appointment with Nephrology on 03/02/18, so ordered repeat labs (BMP, pro-BNP) and instructed him to get them done before that appointment.    #CAD  -S/p PCI to mLAD in 2009. Since then, he has been doing well, without any symptoms of angina or arrhythmias. However, has newly reduced EF as noted above.  - Continue ASA 81mg  daily, lipitor 20mg  daily, toprol XL 100mg  daily.  ??  #Hypercholesterolemia  -Last lipid panel in 2016 with LDL of 12, so lipitor dose was decreased at that time. No repeat since.  - Continue lipitor 20mg  daily for now, pending repeat lipid panel.  ??  #Hypertension  -At goal.  - Continue medications as noted above.      Follow-up in 2 months.    The patient was seen and discussed with Dr. Willa Rough.      History of Present Illness:  Miguel Ward is a 67yo M with h/o kidney transplant 05/2007 for lupus nephritis, hypertension, HLD, and CAD s/p PCI to mLAD who is here for hospital discharge f/u.    He was last seen in Cardiology clinic in 2016, at which time he was doing well from a cardiac standpoint and therefore was continued on his meds without change. Most recently, he was hospitalized earlier this month and found to have RSV with an AKI (Cr up to 2.7). At that time he says he was having cold-like symptoms with a wheeze. He also noted some new orthopnea and LE edema. Repeat TTE showed EF 40-45% with moderate LVH, grade III diastolic dysfunction, and PA pressure ~69; his trop peaked at 0.271. He was diuresed with IV lasix 40mg  with significant improvement; his pro-BNP decreased from 16000 to 9000. He was discharged on lasix 40mg  daily, which he has been compliant with. Today he  reports that he is feeling much better. His cough is almost completely gone. His leg swelling has not returned and he also denies any further orthopnea, PND, or DOE. He can walk up 4 flights of stairs before he notices any SOB and remains fairly active at home. He completely denies any chest pain. He also denies sustained palpitations, dizziness, syncope, or N/V.     Cardiovascular History:  ?? Hypertension.  ?? Hypercholesterolemia.  ?? Pericardial effusion 08/2007, with suspected etiologies including hypothyroidism (TSH 258), recent renal transplantation, and SLE.  ?? Coronary artery disease.    Interventions / Surgery:  ?? Emergent pericardiocentesis 08/26/2007.  ?? Cardiac catheterization and PCI for unstable angina 12/10/2008.  Severe stenosis of the mid LAD treated with a drug eluting stent.  90% distal LAD and 70% distal PDA stenoses also noted, with nonobstructive atherosclerosis elsewhere.    Imaging:  ?? Echocardiogram (12/22/2007): LVH with normal systolic function and diastolic dysfunction.  Degenerative mitral valve disease.  Small pericardial effusion.  ?? Pharmacologic stress perfusion scan (12/06/2008): No evidence of ischemia, with a reversible defect of the mid anteroseptal and apical segments.  Normal ejection fraction.    - 02/14/18 TTE:  ?? Ultrasound enhancing agent utilized to improve endocardial border definition  ?? Left ventricular hypertrophy - moderate  ?? Mildly to moderately decreased left ventricular systolic function, ejection fraction 40 to 45%  ?? Diastolic dysfunction - grade III (severely elevated filling pressures)  ?? Degenerative mitral valve disease  ?? Mitral regurgitation - mild  ?? Dilated left atrium - moderate  ?? Dilated ascending aorta  ?? Dilated right ventricle - mild  ?? Normal right ventricular systolic function  ?? Tricuspid regurgitation - mild to moderate  ?? Elevated pulmonary artery systolic pressure - moderate to severe  ?? Dilated right atrium - mild  ?? Elevated right atrial pressure    Past Medical & Surgical History:  Gout  Hypothyroidism  RCC s/p right renal nephrectomy (02/20/2014)    Allergies:  Reviewed in EMR.    Pertinent Medications (complete listing reviewed in EMR):  Aspirin 81 mg daily.  Atorvastatin 20 mg daily.   (held for AKI)  Metoprolol succinate 100 mg daily.  Furosemide 40mg  daily.    Review of Systems:  Review of ten systems is negative or unremarkable except as stated above.    Physical Exam:  VITAL SIGNS:   Vitals:    02/24/18 1549   BP: 140/73   Pulse: 73   SpO2: 98%   Weight: 76 kg (167 lb 9.6 oz)   Height: 182.9 cm (6')     There is no height or weight on file to calculate BMI. GENERAL: NAD, AAOx4, very pleasant.  HEENT: [place]/AT, PERRL, MMM.  NECK: Supple. No JVD.  RESPIRATORY: CTAB; no w/r/r. Normal WOB on room air.  CARDIOVASCULAR: RRR; no m/r/g.  ABDOMEN: Soft, NT/ND; +BS throughout.  EXTREMITIES: Trace pretibial edema.    Pertinent Laboratory Studies:   Lab Results   Component Value Date    PROBNP 9,110.0 (H) 02/14/2018    PROBNP 16,400.0 (H) 02/11/2018    TRIG 66 02/17/2017    TRIG 80 06/17/2016    TRIG 62 09/09/2015    TRIG 72 01/26/2014    TRIG 68 08/29/2010    TRIG 93 03/14/2010    HDL 57 02/17/2017    HDL 46 06/17/2016    HDL 45 09/09/2015    HDL 49 01/22/2015    HDL 44 01/26/2014    HDL 58 08/02/2013  NONHDL 54 02/17/2017    NONHDL 68 06/17/2016    NONHDL 60 05/21/2015    LDL 41 (L) 02/17/2017    LDL 52 (L) 06/17/2016    LDL 12 09/09/2015    LDL 38 01/26/2014    LDL 54 03/08/2012    LDL 59 08/29/2010    LDL 44 03/14/2010    CREATININE 2.69 (H) 02/14/2018    CREATININE 2.0 (H) 04/27/2017    CREATININE 2.5 (H) 09/20/2014    BUN 44 (H) 02/14/2018    BUN 45 (H) 12/02/2017    K 3.7 02/14/2018    K 4.6 01/22/2015    MG 1.3 (L) 02/14/2018    MG 1.0 (L) 01/22/2015    WBC 4.1 (L) 02/14/2018    WBC 6.5 12/02/2017    WBC 7.2 01/22/2015    WBC 6.1 12/21/2014    HGB 11.0 (L) 02/14/2018    HGB 10.4 (L) 01/22/2015    HCT 36.3 (L) 02/14/2018    HCT 35.3 (L) 01/22/2015    PLT 105 (L) 02/14/2018    PLT 140 (L) 01/22/2015    INR 1.39 02/12/2018    INR 1.21 12/30/2017    INR 1.1 05/23/2014    INR 1.1 02/15/2014

## 2018-02-24 ENCOUNTER — Ambulatory Visit: Admit: 2018-02-24 | Discharge: 2018-02-25 | Payer: MEDICARE | Attending: Internal Medicine | Primary: Internal Medicine

## 2018-02-24 DIAGNOSIS — E7800 Pure hypercholesterolemia, unspecified: Secondary | ICD-10-CM

## 2018-02-24 DIAGNOSIS — I5042 Chronic combined systolic (congestive) and diastolic (congestive) heart failure: Principal | ICD-10-CM

## 2018-02-24 DIAGNOSIS — I10 Essential (primary) hypertension: Secondary | ICD-10-CM

## 2018-02-24 DIAGNOSIS — I2510 Atherosclerotic heart disease of native coronary artery without angina pectoris: Secondary | ICD-10-CM

## 2018-02-24 NOTE — Unmapped (Signed)
It was great to meet you today. As we discussed, continue taking your medications as is. Go for labwork just prior to your next visit with the kidney doctors.

## 2018-03-02 ENCOUNTER — Ambulatory Visit: Admit: 2018-03-02 | Discharge: 2018-03-02 | Payer: MEDICARE | Attending: Nephrology | Primary: Nephrology

## 2018-03-02 ENCOUNTER — Other Ambulatory Visit: Admit: 2018-03-02 | Discharge: 2018-03-02 | Payer: MEDICARE

## 2018-03-02 DIAGNOSIS — Z79899 Other long term (current) drug therapy: Secondary | ICD-10-CM

## 2018-03-02 DIAGNOSIS — Z940 Kidney transplant status: Secondary | ICD-10-CM

## 2018-03-02 DIAGNOSIS — I10 Essential (primary) hypertension: Secondary | ICD-10-CM

## 2018-03-02 DIAGNOSIS — I5042 Chronic combined systolic (congestive) and diastolic (congestive) heart failure: Principal | ICD-10-CM

## 2018-03-02 DIAGNOSIS — E7800 Pure hypercholesterolemia, unspecified: Secondary | ICD-10-CM

## 2018-03-02 DIAGNOSIS — D899 Disorder involving the immune mechanism, unspecified: Secondary | ICD-10-CM

## 2018-03-02 DIAGNOSIS — I2510 Atherosclerotic heart disease of native coronary artery without angina pectoris: Secondary | ICD-10-CM

## 2018-03-02 LAB — COMPREHENSIVE METABOLIC PANEL
ALBUMIN: 3.5 g/dL (ref 3.5–5.0)
ALT (SGPT): 28 U/L (ref 19–72)
ANION GAP: 9 mmol/L (ref 9–15)
AST (SGOT): 27 U/L (ref 19–55)
BILIRUBIN TOTAL: 0.5 mg/dL (ref 0.0–1.2)
BLOOD UREA NITROGEN: 44 mg/dL — ABNORMAL HIGH (ref 7–21)
BUN / CREAT RATIO: 18
CALCIUM: 9.9 mg/dL (ref 8.5–10.2)
CHLORIDE: 104 mmol/L (ref 98–107)
CO2: 28 mmol/L (ref 22.0–30.0)
CREATININE: 2.39 mg/dL — ABNORMAL HIGH (ref 0.70–1.30)
EGFR MDRD AF AMER: 33 mL/min/{1.73_m2} — ABNORMAL LOW (ref >=60)
EGFR MDRD NON AF AMER: 28 mL/min/{1.73_m2} — ABNORMAL LOW (ref >=60)
GLUCOSE RANDOM: 94 mg/dL (ref 65–179)
POTASSIUM: 4.3 mmol/L (ref 3.5–5.0)
PROTEIN TOTAL: 6.7 g/dL (ref 6.5–8.3)
SODIUM: 141 mmol/L (ref 135–145)

## 2018-03-02 LAB — BURR CELLS

## 2018-03-02 LAB — URINALYSIS
BACTERIA: NONE SEEN /HPF
BILIRUBIN UA: NEGATIVE
BLOOD UA: NEGATIVE
GLUCOSE UA: NEGATIVE
KETONES UA: NEGATIVE
LEUKOCYTE ESTERASE UA: NEGATIVE
NITRITE UA: NEGATIVE
PH UA: 7 (ref 5.0–9.0)
PROTEIN UA: 30 — AB
RBC UA: 2 /HPF (ref <=3)
SPECIFIC GRAVITY UA: 1.008 (ref 1.003–1.030)
SQUAMOUS EPITHELIAL: <1 /HPF (ref 0–5)
WBC UA: <1 /HPF (ref <=2)

## 2018-03-02 LAB — CBC W/ AUTO DIFF
BASOPHILS ABSOLUTE COUNT: 0 10*9/L (ref 0.0–0.1)
BASOPHILS RELATIVE PERCENT: 0.3 %
EOSINOPHILS RELATIVE PERCENT: 2.6 %
HEMATOCRIT: 41.2 % (ref 41.0–53.0)
HEMOGLOBIN: 12.1 g/dL — ABNORMAL LOW (ref 13.5–17.5)
LYMPHOCYTES ABSOLUTE COUNT: 1.3 10*9/L — ABNORMAL LOW (ref 1.5–5.0)
LYMPHOCYTES RELATIVE PERCENT: 23 %
MEAN CORPUSCULAR HEMOGLOBIN CONC: 29.4 g/dL — ABNORMAL LOW (ref 31.0–37.0)
MEAN CORPUSCULAR HEMOGLOBIN: 27.2 pg (ref 26.0–34.0)
MEAN CORPUSCULAR VOLUME: 92.7 fL (ref 80.0–100.0)
MEAN PLATELET VOLUME: 10.5 fL — ABNORMAL HIGH (ref 7.0–10.0)
MONOCYTES ABSOLUTE COUNT: 0.4 10*9/L (ref 0.2–0.8)
MONOCYTES RELATIVE PERCENT: 7.3 %
NEUTROPHILS ABSOLUTE COUNT: 3.5 10*9/L (ref 2.0–7.5)
NEUTROPHILS RELATIVE PERCENT: 64.4 %
PLATELET COUNT: 158 10*9/L (ref 150–440)
RED BLOOD CELL COUNT: 4.44 10*12/L — ABNORMAL LOW (ref 4.50–5.90)
RED CELL DISTRIBUTION WIDTH: 15.1 % — ABNORMAL HIGH (ref 12.0–15.0)
WBC ADJUSTED: 5.4 10*9/L (ref 4.5–11.0)

## 2018-03-02 LAB — CREATININE, URINE: Lab: 59.3

## 2018-03-02 LAB — PHOSPHORUS: Phosphate:MCnc:Pt:Ser/Plas:Qn:: 2.9

## 2018-03-02 LAB — TACROLIMUS, TROUGH: Lab: 7.3

## 2018-03-02 LAB — LIPID PANEL
CHOLESTEROL/HDL RATIO SCREEN: 2.2 (ref <5.0)
HDL CHOLESTEROL: 52 mg/dL (ref 40–59)
LDL CHOLESTEROL CALCULATED: 46 mg/dL — ABNORMAL LOW (ref 60–99)
NON-HDL CHOLESTEROL: 63 mg/dL

## 2018-03-02 LAB — CMV VIRAL LD: Lab: NOT DETECTED

## 2018-03-02 LAB — MONOCYTES ABSOLUTE COUNT: Lab: 0.4

## 2018-03-02 LAB — PROTEIN / CREATININE RATIO, URINE
CREATININE, URINE: 59.3 mg/dL
PROTEIN URINE: 78.7 mg/dL

## 2018-03-02 LAB — VLDL CHOLESTEROL CAL: Cholesterol.in VLDL:MCnc:Pt:Ser/Plas:Qn:Calculated: 16.8

## 2018-03-02 LAB — MAGNESIUM
MAGNESIUM: 1.4 mg/dL — ABNORMAL LOW (ref 1.6–2.2)
Magnesium:MCnc:Pt:Ser/Plas:Qn:: 1.4 — ABNORMAL LOW

## 2018-03-02 LAB — KETONES UA: Lab: NEGATIVE

## 2018-03-02 LAB — BUN / CREAT RATIO: Urea nitrogen/Creatinine:MRto:Pt:Ser/Plas:Qn:: 18

## 2018-03-02 LAB — CMV DNA, QUANTITATIVE, PCR: CMV VIRAL LD: NOT DETECTED

## 2018-03-02 LAB — SLIDE REVIEW

## 2018-03-02 LAB — PRO-BNP: Natriuretic peptide.B prohormone N-Terminal:MCnc:Pt:Ser/Plas:Qn:: 11800 — ABNORMAL HIGH

## 2018-03-02 NOTE — Unmapped (Signed)
Transplant Nephrology Clinic Visit    History of Present Illness    Patient is a 67 y.o. male who underwent deceased donor transplant on 06/24/07 secondary to lupus nephritis. His post transplant course has been complicated by early cellular rejection treated with Thymoglobulin. At that time, he was enrolled in the JAK-3 inhibitor trial and was taken off study drug. Additionally, he was noted to have a large pericardial effusion of greater than 1 L in 08/2007. At that time his TSH was 258. This improved with pericardiocentesis and thyroid replacement and has not reaccumulated. In 2009, he had coronary artery disease which required stenting. Patient does not have evidence of donor specific antibodies. His creatinine was between 1.5 and 2.2, but has been more in the low to mid 2s since his right native nephrectomy on 02/20/14 for renal cell carcinoma.  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 Recent baseline creatinine has been between 2.3 and 2.4.    He was admitted to [facility] from 9/15 through 08/31/2016 after presenting with fever and leukocytosis. His initial urine culture specimen was lost, but he was treated for presumptive UTI. He improved clinically on antibiotics. During that evaluation he had a CT of the abdomen on 08/29/2016 that showed New indeterminate liver lesions concerning for metastatic disease versus post transplant lymphoproliferative disorder versus abscesses. He then underwent MRI on 9/18 which showed Two liver lesions with minimal enhancement. Differential includes post transplant lymphoproliferative disorder, sequelae of prior infection or hematoma, less likely acute abscess. Metastases unlikely given enhancement characteristics. EBV was negative.    As an outpatient he then underwent a PET scan on 09/14/2016 which showed Enlarging right hepatic lobe lesion with intense FDG uptake and central area of photopenia, concerning for necrotic metastasis, less likely abscess. Stable right hepatic dome lesion with intense FDG uptake, concerning for additional metastasis. After consult with radiology, they felt the best approach was ultrasound guided biopsy which he had on 10/01/2016. Pathology revealed Fragments of liver tissue with parenchymal extinction, mixed inflammatory infiltrates, and reactive-appearing stromal changes (see comment), - Increased iron staining within adjacent intact hepatic parenchyma (grade 3 of 4), - Sinusoidal congestion. The differential diagnosis includes sequelae from an abscess (e.g., inflammatory pseudotumor) versus nonspecific inflammatory changes adjacent to an unsampled mass. Correlation with the radiologic findings and clinical follow up are suggested.    The patient had a repeat MRI on 02/01/17 to visualize lesions seen on MRI in September. His repeat MRI revealed interval moderate decrease in the size of previously detected liver lesions, which could represent the sequelae of resolving right hepatic lobe infection with interval decrease in size of large right hepatic lobe lesion with internal T1 hyperintensity likely reflecting proteinacious material and mild internal and perilesional enhancement. Interval decrease in size and conspicuity of small hepatic dome lesion is also noted and this could also represent a resolving abscess. They recommended a re-scan in approximately 3 months (04/2017). I reviewed the report and images with Dr. Rush Barer who agreed that the lesion continues to look like resolving infection.    Interval history since last visit 12/17/2017    Since last seen, patient underwent renal biopsy on 12/30/2017 to evaluate proteinuria which had increased over the last year from 0.75 to 1.4. 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 no proliferative glomerular lesions. These changes can indicate a minor recurrence of the lupus nephritis not carrying any direct therapeutic and prognostic significance at the present time.); Tubular pigment deposits, consistent with lipofuscin.    He was admitted 02/11/18-02/14/18 with RSV infection, AKI and new diastolic and systolic heart failure. TTE showed moderate EF 40-45%, reduced from 52% reported in January 2015. Patient's home lasix was increased to 40 mg qd from 20 mg qd with improvement in symptoms. Creatinine peaked at 2.73, 2.69 on discharge. It was suspected that patient's RSV infection worsened his heart failure and caused AKI is due to reduced perfusion. Patient's enalapril was held on discharge.     He presents today without any complaints. Overall he is doing well. Upper respiratory symptoms resolved, completed antibiotics. Lower extremity edema has improved.     Review of Systems    Otherwise on review of systems patient denies fever or chills, chest pain, SOB, PND or orthopnea. No N/V/abdominal pain. No diarrhea. No dysuria, hematuria or difficulty voiding. All other systems are reviewed and are negative.    Medications    Current Outpatient Prescriptions   Medication Sig Dispense Refill   ??? acyclovir (ZOVIRAX) 400 MG tablet Take 1 tablet (400 mg total) by mouth daily. 90 tablet 3   ??? allopurinol (ZYLOPRIM) 100 MG tablet Take 1.5 tablets (150 mg total) by mouth daily. 135 tablet 3   ??? aspirin (ASPIRIN LOW-STRENGTH) 81 MG chewable tablet Chew 81 mg daily.     ??? atorvastatin (LIPITOR) 20 MG tablet Take 1 tablet (20 mg total) by mouth daily. 90 tablet 3   ??? calcitriol (ROCALTROL) 0.25 MCG capsule Take 1 capsule (0.25 mcg total) by mouth Every Monday, Wednesday, and Friday. 36 capsule 3   ??? CELLCEPT 250 mg capsule Take 1 capsule (250 mg total) by mouth Two (2) times a day. 60 capsule 11   ??? colchicine (COLCRYS) 0.6 mg tablet Take 1 tablet (0.6 mg total) by mouth daily as needed. for up to 1 dose 30 tablet 11   ??? ferrous sulfate 325 (65 FE) MG tablet Take 325 mg by mouth 3 (three) times a day.     ??? fluticasone (FLONASE) 50 mcg/actuation nasal spray 1 spray into each nostril daily as needed.     ??? furosemide (LASIX) 40 MG tablet Take 1 tablet (40 mg total) by mouth daily. 30 tablet 0   ??? levothyroxine (SYNTHROID) 200 MCG tablet Take 1 tab with one tab, total dose daily 90 tablet 3   ??? levothyroxine (SYNTHROID, LEVOTHROID) 25 MCG tablet Take 1 tab with one tab, total dose daily 90 tablet 3   ??? magnesium oxide (MAG-OX) 400 mg (241.3 mg magnesium) tablet Take 1 tablet (400 mg total) by mouth Two (2) times a day. 120 tablet 1   ??? metoprolol succinate (TOPROL-XL) 100 MG 24 hr tablet Take 1 tablet (100 mg total) by mouth daily. 90 tablet 3   ??? omeprazole (PRILOSEC) 20 MG capsule Take 1 capsule (20 mg total) by mouth daily. 90 capsule 3   ??? predniSONE (DELTASONE) 5 MG tablet Take 1 tablet (5 mg total) by mouth daily. 90 tablet 3   ??? PROGRAF 1 mg capsule Take 3 capsules (3 mg total) by mouth Two (2) times a day. 180 capsule 11   ??? simethicone (GAS-X) 80 MG chewable tablet Chew 1 tablet (80 mg total) every six (6) hours as needed for flatulence. 30  tablet 1   ??? sodium bicarbonate 650 mg tablet Take 1 tablet (650 mg total) by mouth Two (2) times a day. 180 tablet 3     No current facility-administered medications for this visit.        Physical Exam    BP 158/94 (BP Site: L Arm, BP Position: Sitting, BP Cuff Size: Medium)  - Pulse 68  - Temp 36 ??C (96.8 ??F) (Temporal)  - Wt 77.1 kg (170 lb)  - BMI 23.06 kg/m??    General: Patient is a pleasant male in no apparent distress.  Appears younger than stated age.  Eyes: Sclera anicteric.  ENT: No erythema or exudate.  Neck: Supple without LAD/JVD/bruits.  Lungs: Clear to auscultation bilaterally, no wheezes/rales/rhonchi.  Cardiovascular: Regular rate and rhythm without murmurs, rubs or gallops.  Abdomen: Soft, notender/nondistended. Positive bowel sounds. No tenderness over the graft.  Extremities: No edema. Joints without evidence of synovitis.   Skin: Without rash.  Neurological: Grossly nonfocal.  Psychiatric: Mood and affect appropriate.    Laboratory Results    Recent Results (from the past 170 hour(s))   Lipid panel (non-fasting)    Collection Time: 03/02/18 10:10 AM   Result Value Ref Range    Triglycerides 84 1 - 149 mg/dL    Cholesterol 161 096 - 199 mg/dL    HDL 52 40 - 59 mg/dL    LDL Calculated 46 (L) 60 - 99 mg/dL    VLDL Cholesterol Cal 16.8 12 - 42 mg/dL    Chol/HDL Ratio 2.2 <0.4    Non-HDL Cholesterol 63 mg/dL    FASTING No    Comprehensive Metabolic Panel    Collection Time: 03/02/18 10:10 AM   Result Value Ref Range    Sodium 141 135 - 145 mmol/L    Potassium 4.3 3.5 - 5.0 mmol/L    Chloride 104 98 - 107 mmol/L    CO2 28.0 22.0 - 30.0 mmol/L    BUN 44 (H) 7 - 21 mg/dL    Creatinine 5.40 (H) 0.70 - 1.30 mg/dL    BUN/Creatinine Ratio 18     EGFR MDRD Non Af Amer 28 (L) >=60 mL/min/1.77m2    EGFR MDRD Af Amer 33 (L) >=60 mL/min/1.40m2    Anion Gap 9 9 - 15 mmol/L    Glucose 94 65 - 179 mg/dL    Calcium 9.9 8.5 - 98.1 mg/dL    Albumin 3.5 3.5 - 5.0 g/dL    Total Protein 6.7 6.5 - 8.3 g/dL    Total Bilirubin 0.5 0.0 - 1.2 mg/dL    AST 27 19 - 55 U/L    ALT 28 19 - 72 U/L    Alkaline Phosphatase 84 38 - 126 U/L   Magnesium Level    Collection Time: 03/02/18 10:10 AM   Result Value Ref Range    Magnesium 1.4 (L) 1.6 - 2.2 mg/dL   Phosphorus Level    Collection Time: 03/02/18 10:10 AM   Result Value Ref Range    Phosphorus 2.9 2.9 - 4.7 mg/dL       Assessment and Plan    1. Status post renal transplant with recent AKI. His creatinine is back to his baseline. His Prograf level of 7.3 is a good trough and at goal of 6-8. Will continue current immunosuppression. He remains on a reduced dose of CellCept 250mg  BID with history of diarrhea and malignancy.    2. Right renal cell carcinoma status post nephrectomy. Imaging previously showed hepatic mass  that was biopsied and consistent with inflammatory process. Repeat MRI (04/17/17) showed the lesion continues to improve. Follow-up scan recommended in one year, scheduled for 05/20/18.    3. Hypertension. Mildly elevated in clinic today at 158/94, however he reports good control at home. Will restart enalapril at 5 mg BID and have him repeat labs in one week to evaluate for hyperkalemia or increased creatinine.    4. Hypothyroidism. TSH looked good in January. Not checked today, will get at next visit. Continue current dose.    5. History of coronary artery disease. Asymptomatic.    6. Hypomagnesemia. Level today 1.4, which is stable. Has had issues with supplementation with diarrhea. Is back on magnesium 400 mg BID and tolerating. Continue to encourage ingestion of high magnesium foods (educational information given in the past).    7. Lower extremity edema.  His urine protein to creatinine ratio has been trending up since March of last year, was 0.75, then 0.9, . He does then 1.4 on 12/17/17. Renal biopsy on 12/30/17 showed immune complex mediated glomerulopathy that may be consistent with minor recurrence of lupus nephritis. Do not feel comfortable pushing Cellcept dose higher given diarrhea in the past, hopefully will improve with restarting enalapril.    10. Health maintenance. He received first dose of Shingrix in clinic on 06/2017, unfortunately we do not have any doses today. He was encouraged to get locally if available.    11. Will see patient back on 6/7 (previously scheduled), or sooner if needed.    Scribe's Attestation: Lisbeth Ply, MD obtained and performed the history, physical exam and medical decision making elements that were entered into the chart.  Signed by Delaney Meigs, Scribe, on March 02, 2018 11:35 AM ----------------------------------------------------------------------------------------------------------------------  April 07, 2018 11:56 PM. Documentation assistance provided by the Scribe. I was present during the time the encounter was recorded. The information recorded by the Scribe was done at my direction and has been reviewed and validated by me.  ----------------------------------------------------------------------------------------------------------------------

## 2018-03-02 NOTE — Unmapped (Signed)
Met with pt in nephrology clinic today.  Pt reports feeling much better since discharge and denies fever, cough, N/V/D, or dysuria.  He reports that swelling is improved.  He states that enalapril was discontinued during hospital stay. He held prograf prior to blood draw, last dose was at 2300 last noc.

## 2018-03-03 LAB — VITAMIN D, TOTAL (25OH): Lab: 17.5 — ABNORMAL LOW

## 2018-03-03 NOTE — Unmapped (Signed)
I saw and evaluated the patient, participating in the key portions of the service and reviewing pertinent diagnostic studies and records. I reviewed the resident’s note and agree with the findings and plan.  - Destynee Stringfellow H Laneisha Mino, MD, FACC

## 2018-03-04 NOTE — Unmapped (Signed)
Patient would like [place] to call back next week for refill of prograf  He is good until April  Miguel Ward

## 2018-03-07 LAB — VITAMIN D 1,25-DIHYDROXY: 1,25-Dihydroxyvitamin D:MCnc:Pt:Ser/Plas:Qn:: 46

## 2018-03-07 MED ORDER — CALCITRIOL 0.25 MCG CAPSULE
ORAL_CAPSULE | ORAL | 3 refills | 0 days | Status: CP
Start: 2018-03-07 — End: 2018-11-21

## 2018-03-14 NOTE — Unmapped (Signed)
[facility] Refill Coordination Note  Specialty Medication(s): Prograf 1mg     Miguel Ward, DOB: 09/05/1951  Phone: [phone] (home) , Alternate phone contact: N/A  Phone or address changes today?: No  All above HIPAA information was verified with patient.  Shipping Address: [address]  [facility] [place] [address]   Insurance changes? No    Completed refill call assessment today to schedule patient's medication shipment from the [facility] [phone]).      Confirmed the medication and dosage are correct and have not changed: Yes, regimen is correct and unchanged.    Confirmed patient started or stopped the following medications in the past month:  No, there are no changes reported at this time.    Are you tolerating your medication?:  Miguel Ward reports tolerating the medication.    ADHERENCE    (Below is required for Medicare Part B or Transplant patients only - per drug):   How many tablets were dispensed last month:     Prograf 1 mg   Quantity filled last month: 180   # of tablets left on hand: 13 DAYS    Did you miss any doses in the past 4 weeks? No missed doses reported.    FINANCIAL/SHIPPING    Delivery Scheduled: Yes, Expected medication delivery date: 03/28/2018     The patient will receive an FSI print out for each medication shipped and additional FDA Medication Guides as required.  Patient education from [place] or [employer] Leu may also be included in the shipment    Miguel Ward did not have any additional questions at this time.    Delivery address validated in FSI scheduling system: Yes, address listed in FSI is correct.    We will follow up with patient monthly for standard refill processing and delivery.      Thank you,  Tamala Fothergill   [facility] Specialty Technician

## 2018-03-25 MED FILL — PROGRAF/1MG/CAP: PROGRAF/1MG/CAP | 30 days supply | Qty: 180 | Fill #5

## 2018-04-01 MED ORDER — ATORVASTATIN 20 MG TABLET
ORAL_TABLET | Freq: Every day | ORAL | 3 refills | 0.00000 days | Status: CP
Start: 2018-04-01 — End: 2019-01-20

## 2018-04-04 MED ORDER — FUROSEMIDE 40 MG TABLET
ORAL_TABLET | Freq: Every day | ORAL | 3 refills | 0 days | Status: CP
Start: 2018-04-04 — End: 2018-09-21

## 2018-04-19 NOTE — Unmapped (Signed)
[facility] Refill and Clinical Coordination Note  Medication(s): Prograf    Miguel Ward, DOB: January 12, 1951  Phone: [phone] (home) , Alternate phone contact: N/A  Shipping address: [address]  [place] [address]  Phone or address changes today?: No  All above HIPAA information verified.  Insurance changes? No    Completed refill and clinical call assessment today to schedule patient's medication shipment from the [facility] [phone]).      MEDICATION RECONCILIATION    Confirmed the medication and dosage are correct and have not changed: Yes, regimen is correct and unchanged.    Were there any changes to your medication(s) in the past month:  No, there are no changes reported at this time.    ADHERENCE    Is this medicine transplant or covered by Medicare Part B? No.    Did you miss any doses in the past 4 weeks? No missed doses reported.  Adherence counseling provided? Not needed     SIDE EFFECT MANAGEMENT    Are you tolerating your medication?:  Miguel Ward reports tolerating the medication.  Side effect management discussed: None      Therapy is appropriate and should be continued.    Evidence of clinical benefit: See Epic note from 03/02/18      FINANCIAL/SHIPPING    Delivery Scheduled: Yes, Expected medication delivery date: Tues, May 14   Additional medications refilled: No additional medications/refills needed at this time.    The patient will receive an FSI print out for each medication shipped and additional FDA Medication Guides as required.  Patient education from [place] or [employer] Leu may also be included in the shipment.    Miguel Ward did not have any additional questions at this time.    Delivery address validated in FSI scheduling system: Yes, address listed above is correct.      We will follow up with patient monthly for standard refill processing and delivery.      Thank you,  Tawanna Solo [facility] Specialty Pharmacist

## 2018-04-25 MED FILL — PROGRAF/1MG/CAP: PROGRAF/1MG/CAP | 30 days supply | Qty: 180 | Fill #6

## 2018-05-04 MED ORDER — OMEPRAZOLE 20 MG CAPSULE,DELAYED RELEASE
ORAL_CAPSULE | Freq: Every day | ORAL | 3 refills | 0 days | Status: CP
Start: 2018-05-04 — End: 2019-02-20

## 2018-05-10 MED ORDER — METOPROLOL SUCCINATE ER 100 MG TABLET,EXTENDED RELEASE 24 HR
ORAL_TABLET | Freq: Every day | ORAL | 3 refills | 0.00000 days | Status: CP
Start: 2018-05-10 — End: 2019-02-20

## 2018-05-18 NOTE — Unmapped (Addendum)
[facility] Refill Coordination Note    Specialty Medication(s) to be Shipped:   Transplant: Prograf 1mg     Other medication(s) to be shipped:      Miguel Ward, DOB: 09/26/1951  Phone: [phone] (home)   Shipping Address: [address]  [facility] [place] [address]    All above HIPAA information was verified with patient.     Completed refill call assessment today to schedule patient's medication shipment from the [facility] [phone]).       Specialty medication(s) and dose(s) confirmed: Regimen is correct and unchanged.   Changes to medications: Miguel Ward reports no changes reported at this time.  Changes to insurance: No  Questions for the pharmacist: No    The patient will receive an FSI print out for each medication shipped and additional FDA Medication Guides as required.  Patient education from [place] or [employer] Leu may also be included in the shipment.    DISEASE-SPECIFIC INFORMATION        N/A    ADHERENCE              MEDICARE PART B DOCUMENTATION         SHIPPING     Shipping address confirmed in FSI.     Delivery Scheduled: Yes, Expected medication delivery date: [id number] [facility] NEXT DAY via UPS or courier.     Miguel Ward   [facility] Specialty Technician

## 2018-05-20 ENCOUNTER — Ambulatory Visit: Admit: 2018-05-20 | Discharge: 2018-05-20 | Payer: MEDICARE | Attending: Nephrology | Primary: Nephrology

## 2018-05-20 ENCOUNTER — Ambulatory Visit: Admit: 2018-05-20 | Discharge: 2018-05-20 | Payer: MEDICARE

## 2018-05-20 DIAGNOSIS — D899 Disorder involving the immune mechanism, unspecified: Secondary | ICD-10-CM

## 2018-05-20 DIAGNOSIS — Z940 Kidney transplant status: Principal | ICD-10-CM

## 2018-05-20 DIAGNOSIS — R809 Proteinuria, unspecified: Secondary | ICD-10-CM

## 2018-05-20 DIAGNOSIS — Z79899 Other long term (current) drug therapy: Secondary | ICD-10-CM

## 2018-05-20 DIAGNOSIS — E039 Hypothyroidism, unspecified: Secondary | ICD-10-CM

## 2018-05-20 DIAGNOSIS — K769 Liver disease, unspecified: Secondary | ICD-10-CM

## 2018-05-20 DIAGNOSIS — I10 Essential (primary) hypertension: Secondary | ICD-10-CM

## 2018-05-20 LAB — THYROID STIMULATING HORMONE: Thyrotropin:ACnc:Pt:Ser/Plas:Qn:: 4.3 — ABNORMAL HIGH

## 2018-05-20 LAB — URINALYSIS
BACTERIA: NONE SEEN /HPF
BILIRUBIN UA: NEGATIVE
BLOOD UA: NEGATIVE
GLUCOSE UA: NEGATIVE
LEUKOCYTE ESTERASE UA: NEGATIVE
NITRITE UA: NEGATIVE
PH UA: 6 (ref 5.0–9.0)
PROTEIN UA: 30 — AB
RBC UA: 3 /HPF (ref <=3)
SPECIFIC GRAVITY UA: 1.008 (ref 1.003–1.030)
SQUAMOUS EPITHELIAL: <1 /HPF (ref 0–5)
UROBILINOGEN UA: 0.2
WBC UA: <1 /HPF (ref <=2)

## 2018-05-20 LAB — KETONES UA: Lab: NEGATIVE

## 2018-05-20 LAB — CBC W/ AUTO DIFF
BASOPHILS ABSOLUTE COUNT: 0 10*9/L (ref 0.0–0.1)
BASOPHILS RELATIVE PERCENT: 0.5 %
EOSINOPHILS ABSOLUTE COUNT: 0.1 10*9/L (ref 0.0–0.4)
EOSINOPHILS RELATIVE PERCENT: 2.7 %
HEMATOCRIT: 38.4 % — ABNORMAL LOW (ref 41.0–53.0)
HEMOGLOBIN: 11.5 g/dL — ABNORMAL LOW (ref 13.5–17.5)
LYMPHOCYTES ABSOLUTE COUNT: 1.2 10*9/L — ABNORMAL LOW (ref 1.5–5.0)
LYMPHOCYTES RELATIVE PERCENT: 22 %
MEAN CORPUSCULAR HEMOGLOBIN CONC: 29.8 g/dL — ABNORMAL LOW (ref 31.0–37.0)
MEAN CORPUSCULAR VOLUME: 94.5 fL (ref 80.0–100.0)
MEAN PLATELET VOLUME: 9.8 fL (ref 7.0–10.0)
MONOCYTES ABSOLUTE COUNT: 0.5 10*9/L (ref 0.2–0.8)
MONOCYTES RELATIVE PERCENT: 9.3 %
NEUTROPHILS ABSOLUTE COUNT: 3.4 10*9/L (ref 2.0–7.5)
NEUTROPHILS RELATIVE PERCENT: 63.6 %
PLATELET COUNT: 136 10*9/L — ABNORMAL LOW (ref 150–440)
RED CELL DISTRIBUTION WIDTH: 15.1 % — ABNORMAL HIGH (ref 12.0–15.0)
WBC ADJUSTED: 5.3 10*9/L (ref 4.5–11.0)

## 2018-05-20 LAB — PROTEIN URINE: Protein:MCnc:Pt:Urine:Qn:: 78.5

## 2018-05-20 LAB — CALCIUM: Calcium:MCnc:Pt:Ser/Plas:Qn:: 9.7

## 2018-05-20 LAB — COMPREHENSIVE METABOLIC PANEL
ALKALINE PHOSPHATASE: 99 U/L (ref 38–126)
ALT (SGPT): 29 U/L (ref 19–72)
ANION GAP: 9 mmol/L (ref 9–15)
AST (SGOT): 27 U/L (ref 19–55)
BILIRUBIN TOTAL: 0.7 mg/dL (ref 0.0–1.2)
BLOOD UREA NITROGEN: 42 mg/dL — ABNORMAL HIGH (ref 7–21)
BUN / CREAT RATIO: 19
CALCIUM: 9.7 mg/dL (ref 8.5–10.2)
CHLORIDE: 104 mmol/L (ref 98–107)
CO2: 27 mmol/L (ref 22.0–30.0)
CREATININE: 2.23 mg/dL — ABNORMAL HIGH (ref 0.70–1.30)
EGFR MDRD AF AMER: 36 mL/min/{1.73_m2} — ABNORMAL LOW (ref >=60)
EGFR MDRD NON AF AMER: 30 mL/min/{1.73_m2} — ABNORMAL LOW (ref >=60)
GLUCOSE RANDOM: 91 mg/dL (ref 65–99)
POTASSIUM: 4.3 mmol/L (ref 3.5–5.0)
PROTEIN TOTAL: 6.5 g/dL (ref 6.5–8.3)
SODIUM: 140 mmol/L (ref 135–145)

## 2018-05-20 LAB — VITAMIN D, TOTAL (25OH): Lab: 15.3 — ABNORMAL LOW

## 2018-05-20 LAB — BASOPHILS RELATIVE PERCENT: Lab: 0.5

## 2018-05-20 LAB — SMEAR REVIEW

## 2018-05-20 LAB — PROTEIN / CREATININE RATIO, URINE: PROTEIN/CREAT RATIO, URINE: 1.504

## 2018-05-20 LAB — CO2: Carbon dioxide:SCnc:Pt:Ser/Plas:Qn:: 27

## 2018-05-20 LAB — MAGNESIUM
MAGNESIUM: 1.5 mg/dL — ABNORMAL LOW (ref 1.6–2.2)
Magnesium:MCnc:Pt:Ser/Plas:Qn:: 1.5 — ABNORMAL LOW

## 2018-05-20 LAB — TACROLIMUS, TROUGH: Lab: 5.9

## 2018-05-20 LAB — PHOSPHORUS: Phosphate:MCnc:Pt:Ser/Plas:Qn:: 3

## 2018-05-20 NOTE — Unmapped (Addendum)
Transplant Nephrology Clinic Visit      History of Present Illness    Patient is a 67 y.o. male who underwent deceased donor transplant on 2007/06/14 secondary to lupus nephritis. His post transplant course has been complicated by early cellular rejection treated with Thymoglobulin. At that time, he was enrolled in the JAK-3 inhibitor trial and was taken off study drug. Additionally, he was noted to have a large pericardial effusion of greater than 1 L in 08/2007. At that time his TSH was 258. This improved with pericardiocentesis and thyroid replacement and has not reaccumulated. In 2009, he had coronary artery disease which required stenting. Patient does not have evidence of donor specific antibodies. His creatinine was between 1.5 and 2.2, but has been more in the low to mid 2s since his right native nephrectomy on 02/20/14 for renal cell carcinoma.  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 Recent baseline creatinine has been between 2.3 and 2.4.    He was admitted to [facility] from 9/15 through 08/31/2016 after presenting with fever and leukocytosis. His initial urine culture specimen was lost, but he was treated for presumptive UTI. He improved clinically on antibiotics. During that evaluation he had a CT of the abdomen on 08/29/2016 that showed New indeterminate liver lesions concerning for metastatic disease versus post transplant lymphoproliferative disorder versus abscesses. He then underwent MRI on 9/18 which showed Two liver lesions with minimal enhancement. Differential includes post transplant lymphoproliferative disorder, sequelae of prior infection or hematoma, less likely acute abscess. Metastases unlikely given enhancement characteristics. EBV was negative.    As an outpatient he then underwent a PET scan on 09/14/2016 which showed Enlarging right hepatic lobe lesion with intense FDG uptake and central area of photopenia, concerning for necrotic metastasis, less likely abscess. Stable right hepatic dome lesion with intense FDG uptake, concerning for additional metastasis. After consult with radiology, they felt the best approach was ultrasound guided biopsy which he had on 10/01/2016. Pathology revealed Fragments of liver tissue with parenchymal extinction, mixed inflammatory infiltrates, and reactive-appearing stromal changes (see comment), - Increased iron staining within adjacent intact hepatic parenchyma (grade 3 of 4), - Sinusoidal congestion. The differential diagnosis includes sequelae from an abscess (e.g., inflammatory pseudotumor) versus nonspecific inflammatory changes adjacent to an unsampled mass. Correlation with the radiologic findings and clinical follow up are suggested.    The patient had a repeat MRI on 02/01/17 to visualize lesions seen on MRI in September. His repeat MRI revealed interval moderate decrease in the size of previously detected liver lesions, which could represent the sequelae of resolving right hepatic lobe infection with interval decrease in size of large right hepatic lobe lesion with internal T1 hyperintensity likely reflecting proteinacious material and mild internal and perilesional enhancement. Interval decrease in size and conspicuity of small hepatic dome lesion is also noted and this could also represent a resolving abscess. They recommended a re-scan in approximately 3 months (04/2017). I reviewed the report and images with Dr. Rush Barer who agreed that the lesion continues to look like resolving infection.     Patient underwent renal biopsy on 12/30/2017 to evaluate proteinuria which had increased over the last year from 0.75 to 1.4. 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 no proliferative  glomerular lesions. These changes can indicate a minor recurrence of the lupus nephritis not carrying any direct therapeutic and prognostic significance at the present time.); Tubular pigment deposits, consistent with lipofuscin.    He was admitted 02/11/18-02/14/18 with RSV infection, AKI and new diastolic and systolic heart failure. TTE showed moderate EF 40-45%, reduced from 52% reported in January 2015. Patient's home lasix was increased to 40 mg qd from 20 mg qd with improvement in symptoms. Creatinine peaked at 2.73, 2.69 on discharge. It was suspected that patient's RSV infection worsened his heart failure and caused AKI is due to reduced perfusion. Patient's enalapril was held on discharge.     Interval history since last visit 03/02/2018    Patient presents today for follow up visit. He notes increased lower extremity swelling, most notable at the end of day. Reports swelling resolves when he wakes up in the morning. He was restarted on Enalapril 5 mg bid at last visit and has been taking regularly. Patient does not wear compression stockings. At home blood pressure has been elevated, worse in the morning and improves after taking his medications, recorded at 150/80. Otherwise, he is doing well.     Review of Systems    Otherwise on review of systems patient denies fever or chills, chest pain, SOB, PND or orthopnea. No N/V/abdominal pain. No diarrhea. No dysuria, hematuria or difficulty voiding. All other systems are reviewed and are negative.    Medications    Current Outpatient Medications   Medication Sig Dispense Refill   ??? acyclovir (ZOVIRAX) 400 MG tablet Take 1 tablet (400 mg total) by mouth daily. 90 tablet 3   ??? allopurinol (ZYLOPRIM) 100 MG tablet Take 1.5 tablets (150 mg total) by mouth daily. 135 tablet 3   ??? aspirin (ASPIRIN LOW-STRENGTH) 81 MG chewable tablet Chew 81 mg daily.     ??? atorvastatin (LIPITOR) 20 MG tablet Take 1 tablet (20 mg total) by mouth daily. 90 tablet 3   ??? calcitriol (ROCALTROL) 0.25 MCG capsule Take 1 capsule (0.25 mcg total) by mouth Every Monday, Wednesday, and Friday. 36 capsule 3   ??? CELLCEPT 250 mg capsule Take 1 capsule (250 mg total) by mouth Two (2) times a day. 60 capsule 11   ??? colchicine (COLCRYS) 0.6 mg tablet Take 1 tablet (0.6 mg total) by mouth daily as needed. for up to 1 dose 30 tablet 11   ??? ferrous sulfate 325 (65 FE) MG tablet Take 325 mg by mouth 3 (three) times a day.     ??? fluticasone (FLONASE) 50 mcg/actuation nasal spray 1 spray into each nostril daily as needed.     ??? furosemide (LASIX) 40 MG tablet Take 1 tablet (40 mg total) by mouth daily. 90 tablet 3   ??? levothyroxine (SYNTHROID) 200 MCG tablet Take 1 tab with one tab, total dose daily 90 tablet 3   ??? levothyroxine (SYNTHROID, LEVOTHROID) 25 MCG tablet Take 1 tab with one tab, total dose daily 90 tablet 3   ??? magnesium oxide (MAG-OX) 400 mg (241.3 mg magnesium) tablet Take 1 tablet (400 mg total) by mouth Two (2) times a day. 120 tablet 1   ??? metoprolol succinate (TOPROL-XL) 100 MG 24 hr tablet Take 1 tablet (100 mg total) by mouth daily. 90 tablet 3   ??? omeprazole (PRILOSEC) 20 MG capsule Take 1 capsule (20 mg total) by mouth daily. 90 capsule 3   ??? predniSONE (DELTASONE) 5 MG tablet Take 1 tablet (5  mg total) by mouth daily. 90 tablet 3   ??? PROGRAF 1 mg capsule Take 3 capsules (3 mg total) by mouth Two (2) times a day. 180 capsule 11   ??? simethicone (GAS-X) 80 MG chewable tablet Chew 1 tablet (80 mg total) every six (6) hours as needed for flatulence. 30 tablet 1   ??? sodium bicarbonate 650 mg tablet Take 1 tablet (650 mg total) by mouth Two (2) times a day. 180 tablet 3     No current facility-administered medications for this visit.        Physical Exam    BP 156/96 (BP Site: L Arm, BP Position: Sitting, BP Cuff Size: Medium)  - Pulse 72  - Temp 36.2 ??C (97.2 ??F) (Tympanic)  - Wt 82.4 kg (181 lb 9.6 oz)  - BMI 24.63 kg/m??    General: Patient is a pleasant male in no apparent distress.  Appears younger than stated age.  Eyes: Sclera anicteric.  ENT: No erythema or exudate.  Neck: Supple without LAD/JVD/bruits.  Lungs: Clear to auscultation bilaterally, no wheezes/rales/rhonchi.  Cardiovascular: Regular rate and rhythm without murmurs, rubs or gallops.  Abdomen: Soft, notender/nondistended. Positive bowel sounds. No tenderness over the graft.  Extremities: 1+ edema bilaterally. Joints without evidence of synovitis.   Skin: Without rash.  Neurological: Grossly nonfocal.  Psychiatric: Mood and affect appropriate.    Laboratory Results    Recent Results (from the past 170 hour(s))   Parathyroid Hormone (PTH)    Collection Time: 05/20/18  9:59 AM   Result Value Ref Range    PTH 530.9 (H) 12.0 - 72.0 pg/mL    Calcium 9.7 8.5 - 10.2 mg/dL   Vitamin D 25 Hydroxy (25OH D2 + D3)    Collection Time: 05/20/18  9:59 AM   Result Value Ref Range    Vitamin D Total (25OH) 15.3 (L) 20.0 - 80.0 ng/mL   EBV QUANTITATIVE PCR, BLOOD    Collection Time: 05/20/18  9:59 AM   Result Value Ref Range    EBV Viral Load Result Not Detected Not Detected   CMV DNA, quantitative, PCR    Collection Time: 05/20/18  9:59 AM   Result Value Ref Range    CMV Viral Ld Not Detected Not Detected    CMV Quant  <0 IU/mL    CMV Quant Log10  <0.00 log IU/mL    CMV Comment     TSH    Collection Time: 05/20/18  9:59 AM   Result Value Ref Range    TSH 4.300 (H) 0.600 - 3.300 uIU/mL   Tacrolimus Level, Trough    Collection Time: 05/20/18  9:59 AM   Result Value Ref Range    Tacrolimus, Trough 5.9 5.0 - 15.0 ng/mL   Phosphorus Level    Collection Time: 05/20/18  9:59 AM   Result Value Ref Range    Phosphorus 3.0 2.9 - 4.7 mg/dL   Magnesium Level    Collection Time: 05/20/18  9:59 AM   Result Value Ref Range    Magnesium 1.5 (L) 1.6 - 2.2 mg/dL   Comprehensive Metabolic Panel    Collection Time: 05/20/18  9:59 AM   Result Value Ref Range Sodium 140 135 - 145 mmol/L    Potassium 4.3 3.5 - 5.0 mmol/L    Chloride 104 98 - 107 mmol/L    CO2 27.0 22.0 - 30.0 mmol/L    BUN 42 (H) 7 - 21 mg/dL    Creatinine 0.27 (H) 0.70 - 1.30  mg/dL    BUN/Creatinine Ratio 19     EGFR MDRD Non Af Amer 30 (L) >=60 mL/min/1.5m2    EGFR MDRD Af Amer 36 (L) >=60 mL/min/1.18m2    Anion Gap 9 9 - 15 mmol/L    Glucose 91 65 - 99 mg/dL    Calcium 9.7 8.5 - 16.1 mg/dL    Albumin 3.4 (L) 3.5 - 5.0 g/dL    Total Protein 6.5 6.5 - 8.3 g/dL    Total Bilirubin 0.7 0.0 - 1.2 mg/dL    AST 27 19 - 55 U/L    ALT 29 19 - 72 U/L    Alkaline Phosphatase 99 38 - 126 U/L   CBC w/ Differential    Collection Time: 05/20/18  9:59 AM   Result Value Ref Range    WBC 5.3 4.5 - 11.0 10*9/L    RBC 4.07 (L) 4.50 - 5.90 10*12/L    HGB 11.5 (L) 13.5 - 17.5 g/dL    HCT 09.6 (L) 04.5 - 53.0 %    MCV 94.5 80.0 - 100.0 fL    MCH 28.2 26.0 - 34.0 pg    MCHC 29.8 (L) 31.0 - 37.0 g/dL    RDW 40.9 (H) 81.1 - 15.0 %    MPV 9.8 7.0 - 10.0 fL    Platelet 136 (L) 150 - 440 10*9/L    Neutrophils % 63.6 %    Lymphocytes % 22.0 %    Monocytes % 9.3 %    Eosinophils % 2.7 %    Basophils % 0.5 %    Absolute Neutrophils 3.4 2.0 - 7.5 10*9/L    Absolute Lymphocytes 1.2 (L) 1.5 - 5.0 10*9/L    Absolute Monocytes 0.5 0.2 - 0.8 10*9/L    Absolute Eosinophils 0.1 0.0 - 0.4 10*9/L    Absolute Basophils 0.0 0.0 - 0.1 10*9/L    Large Unstained Cells 2 0 - 4 %    Macrocytosis Slight (A) Not Present    Hypochromasia Marked (A) Not Present   Morphology Review    Collection Time: 05/20/18  9:59 AM   Result Value Ref Range    Smear Review Comments See Comment (A) Undefined    Burr Cells Present (A) Not Present    Acanthocytes Present (A) Not Present   Protein/Creatinine Ratio, Urine    Collection Time: 05/20/18 10:17 AM   Result Value Ref Range    Creat U 52.2 Undefined mg/dL    Protein, Ur 91.4 Undefined mg/dL    Protein/Creatinine Ratio, Urine 1.504 Undefined   Urine Culture    Collection Time: 05/20/18 10:17 AM   Result Value Ref Range    Urine Culture, Comprehensive Mixed Urogenital Flora    Urinalysis    Collection Time: 05/20/18 10:17 AM   Result Value Ref Range    Color, UA Yellow     Clarity, UA Clear     Specific Gravity, UA 1.008 1.003 - 1.030    pH, UA 6.0 5.0 - 9.0    Leukocyte Esterase, UA Negative Negative    Nitrite, UA Negative Negative    Protein, UA 30 mg/dL (A) Negative    Glucose, UA Negative Negative    Ketones, UA Negative Negative    Urobilinogen, UA 0.2 mg/dL 0.2 mg/dL, 1.0 mg/dL    Bilirubin, UA Negative Negative    Blood, UA Negative Negative    RBC, UA 3 <=3 /HPF    WBC, UA <1 <=2 /HPF    Squam Epithel, UA <1 0 -  5 /HPF    Bacteria, UA None Seen None Seen /HPF       MRI Abdomen:  Impression       --Mildly decreased size of indeterminate hepatic segment 7/6 lesion with increased retraction of the adjacent liver cortex, may represent sequelae of prior infection/inflammatory process or resolving hematoma with fibrotic changes. Stable size of 1.0 cm hepatic dome lesion.  --Cholelithiasis with circumferential gallbladder wall thickening/edema. This finding is not specific and could be associated with liver disease, subacute/chronic cholecystitis or third spacing. Acute cholecystitis is less likely.  --The right lower quadrant transplant kidney demonstrates mildly heterogeneous enhancement. This finding is not specific and could be seen secondary to renal impairment, resection or infection. Clinical correlation is recommended.  --Status post right nephrectomy without evidence of recurrent disease in the nephrectomy bed.   --Unchanged tiny cysts in the uncinate process of the pancreas, may represent side branch IPMNs. Recommend follow-up MRI abdomen in one year to ensure stability.  --Hemosiderosis of the bone marrow, liver and spleen, similar to prior.       Assessment and Plan    1. Status post renal transplant. His creatinine is at baseline. His Prograf level of 5.9 is a 11 hour trough and only slightly below goal of 6-8. Will continue current immunosuppression. He remains on a reduced dose of CellCept 250mg  BID with history of diarrhea and malignancy.    2. Right renal cell carcinoma status post nephrectomy. Imaging previously showed hepatic mass that was biopsied and consistent with inflammatory process. MRI on 04/17/17 showed the lesion continued to improve. MRI today remains reassuring. Will review with SRF for any additional recommendations.    3. Cholelithiasis on imaging. Patient completely asymptomatic with normal LFTs and wbc count. Will discuss with SRF need for prophylactic cholecystectomy.    4. Hypertension. Mildly elevated in clinic today at 156/96. Since labs and potassium stable, will increase enalapril to 10 mg BID.     5. Hypothyroidism. TSH only mildly elevated at 4.3. Continue current dose.    6. History of coronary artery disease. Asymptomatic.    6. Hypomagnesemia. Level mildly decreased today at 1.5, which is stable. Has had issues with supplementation with diarrhea. Tolerating 400 mg BID. Continue to encourage ingestion of high magnesium foods (educational information given in the past).    7. Lower extremity edema.  His urine protein to creatinine ratio has been trending up since March of last year, was 0.75, then 0.9, 1.4 and 1.3. Renal biopsy on 12/30/17 showed immune complex mediated glomerulopathy that may be consistent with minor recurrence of lupus nephritis. Do not feel comfortable pushing Cellcept dose higher given diarrhea in the past. Unfortunately even with restarting enalapril, his UP/C is still high at 1.5. Should improve with higher dose.    8. Health maintenance. He received second dose of Shingrix in clinic today.     9. Will see patient back in 4 months or sooner if needed.    ADDENDUM: Reviewed MRI with SRF. There was some concern that a past episode of cholecystitis may have lead to the liver inflammation. Wanted a HIDA which was done 06/13/2018 with the following result:    Impression Normal hepatobiliary scan. No evidence of cystic duct or common bile duct obstruction to suggest cholecystitis.     No further evaluation needed at present.      Scribe's Attestation: Lisbeth Ply, MD obtained and performed the history, physical exam and medical decision making elements that were entered into the chart.  Signed by Jerl Santos, Scribe, on May 20, 2018 at 11:19 AM.    ----------------------------------------------------------------------------------------------------------------------  June 01, 2018 10:02 PM. Documentation assistance provided by the Scribe. I was present during the time the encounter was recorded. The information recorded by the Scribe was done at my direction and has been reviewed and validated by me.  ----------------------------------------------------------------------------------------------------------------------

## 2018-05-20 NOTE — Unmapped (Signed)
Met with pt in nephrology clinic today.  He reports that he has increased LLE since hospital discharge in March.  It is gone in the morning, but quickly reaccumulates during the course of the day.  His BP at home is high in the mornings, but goes down to 150/80 after he takes his BP med.  He has also noticed some weight gain.  His appetite is good and he denies any SOB or CP.  He denies fever, cough, N/V/D, or dysuria.  He held prograf prior to blood draw today, last dose was at 2300 last noc.  He does not have a return appt w/cards.  Number for clinic provided to him since they wanted to see him 2 months after his March visit.  Pt states he will call.

## 2018-05-23 LAB — CMV QUANT: Lab: 0

## 2018-05-23 LAB — CMV DNA, QUANTITATIVE, PCR

## 2018-05-23 MED FILL — PROGRAF/1MG/CAP: PROGRAF/1MG/CAP | 30 days supply | Qty: 180 | Fill #7

## 2018-05-24 LAB — HLA DS POST TRANSPLANT
ANTI-DONOR DRW #1 MFI: 58 MFI
ANTI-DONOR HLA-A #1 MFI: 0 MFI
ANTI-DONOR HLA-A #2 MFI: 0 MFI
ANTI-DONOR HLA-B #1 MFI: 4 MFI
ANTI-DONOR HLA-DQB #1 MFI: 0 MFI
ANTI-DONOR HLA-DQB #2 MFI: 0 MFI
ANTI-DONOR HLA-DR #1 MFI: 8 MFI
ANTI-DONOR HLA-DR #2 MFI: 0 MFI

## 2018-05-24 LAB — ANTI-DONOR HLA-DR #2 MFI: Lab: 0

## 2018-05-24 LAB — EBV VIRAL LOAD RESULT: Lab: NOT DETECTED

## 2018-05-24 LAB — HLA CL2 AB COMMENT: Lab: 0

## 2018-05-24 LAB — HLA CLASS 1 ANTIBODY RESULT: Lab: NEGATIVE

## 2018-05-24 LAB — FSAB CLASS 2 ANTIBODY SPECIFICITY: HLA CL2 AB RESULT: NEGATIVE

## 2018-05-25 LAB — VITAMIN D 1,25-DIHYDROXY: 1,25-Dihydroxyvitamin D:MCnc:Pt:Ser/Plas:Qn:: 38

## 2018-06-07 NOTE — Unmapped (Signed)
Per Dr. Carlene Coria, she reviewed pt's scan with the transplant surgeons and they recommended a HIDA scan to eval, orders placed

## 2018-06-07 NOTE — Unmapped (Signed)
Per Dr Carlene Coria, pt needs HIDA scan, pt aware and scheduled for 06/13/18 at 830 and be NPO for 6 hours

## 2018-06-10 MED ORDER — ENALAPRIL MALEATE 10 MG TABLET
ORAL_TABLET | Freq: Two times a day (BID) | ORAL | 3 refills | 0 days | Status: CP
Start: 2018-06-10 — End: 2018-10-21

## 2018-06-10 NOTE — Unmapped (Signed)
Per order Dr. Carlene Coria, pt instructed to increase enalapril to 10mg  twice daily.  He will repeat labs in one week.

## 2018-06-13 ENCOUNTER — Ambulatory Visit: Admit: 2018-06-13 | Discharge: 2018-06-13 | Payer: MEDICARE

## 2018-06-13 DIAGNOSIS — K8020 Calculus of gallbladder without cholecystitis without obstruction: Secondary | ICD-10-CM

## 2018-06-13 DIAGNOSIS — Z940 Kidney transplant status: Principal | ICD-10-CM

## 2018-06-14 NOTE — Unmapped (Signed)
[facility] Refill Coordination Note    Specialty Medication(s) to be Shipped:   Transplant: Prograf 1mg     Other medication(s) to be shipped:       Miguel Ward, DOB: Sep 21, 1951  Phone: [phone] (home)   Shipping Address: [address]  [facility] [place] [address]    All above HIPAA information was verified with patient.     Completed refill call assessment today to schedule patient's medication shipment from the [facility] [phone]).       Specialty medication(s) and dose(s) confirmed: Regimen is correct and unchanged.   Changes to medications: Bertil reports starting the following medications: INCREASE ENALAPRIL 10MG  TWICE A DAY  Changes to insurance: No  Questions for the pharmacist: No    The patient will receive an FSI print out for each medication shipped and additional FDA Medication Guides as required.  Patient education from [employer] or Robet Leu may also be included in the shipment.    DISEASE-SPECIFIC INFORMATION        N/A    ADHERENCE              MEDICARE PART B DOCUMENTATION     Prograf 1mg : Patient has 42 capsules on hand.    SHIPPING     Shipping address confirmed in FSI.     Delivery Scheduled: Yes, Expected medication delivery date: [id number] [facility] [place] via UPS or courier.     Antonietta Barcelona   [facility] Specialty Technician

## 2018-06-18 MED FILL — PROGRAF/1MG/CAP: PROGRAF/1MG/CAP | 30 days supply | Qty: 180 | Fill #8

## 2018-06-20 ENCOUNTER — Ambulatory Visit: Admit: 2018-06-20 | Discharge: 2018-07-03 | Payer: MEDICARE

## 2018-06-21 NOTE — Unmapped (Signed)
UNOS form

## 2018-07-01 MED ORDER — MAGNESIUM OXIDE 400 MG (241.3 MG MAGNESIUM) TABLET
ORAL_TABLET | Freq: Two times a day (BID) | ORAL | 3 refills | 0.00000 days | Status: CP
Start: 2018-07-01 — End: 2019-07-06

## 2018-07-01 NOTE — Unmapped (Signed)
Pt requested refill for magnesium supplement.  This was done.  Pt also c/o weight gain and swelling, noting that he has gained about 8lbs this week.  He denies SOB or CP.  He is not checking BP at home.  He notes that left leg is more swollen than the right, but denies redness.  He notices some tenderness when he first gets up in the morning, but as he walks around the pain subsides.  Pt instructed to go to ER if pain or swelling worsens.  Will notify Dr. Carlene Coria.

## 2018-07-04 ENCOUNTER — Other Ambulatory Visit
Admission: RE | Admit: 2018-07-04 | Discharge: 2018-07-04 | Disposition: A | Discharge status: Home / Self Care | Payer: Medicare Other | Source: Ambulatory Visit | Attending: Nephrology | Admitting: Nephrology

## 2018-07-04 ENCOUNTER — Encounter: Admit: 2018-07-04 | Discharge: 2018-07-04 | Payer: MEDICARE | Attending: Nephrology | Primary: Nephrology

## 2018-07-04 DIAGNOSIS — B259 Cytomegaloviral disease, unspecified: Secondary | ICD-10-CM | POA: Insufficient documentation

## 2018-07-04 DIAGNOSIS — Z940 Kidney transplant status: Secondary | ICD-10-CM | POA: Insufficient documentation

## 2018-07-04 DIAGNOSIS — Z114 Encounter for screening for human immunodeficiency virus [HIV]: Secondary | ICD-10-CM | POA: Diagnosis not present

## 2018-07-04 DIAGNOSIS — N390 Urinary tract infection, site not specified: Secondary | ICD-10-CM | POA: Insufficient documentation

## 2018-07-04 DIAGNOSIS — Z789 Other specified health status: Secondary | ICD-10-CM | POA: Diagnosis not present

## 2018-07-04 DIAGNOSIS — Z79899 Other long term (current) drug therapy: Secondary | ICD-10-CM | POA: Insufficient documentation

## 2018-07-04 DIAGNOSIS — D631 Anemia in chronic kidney disease: Secondary | ICD-10-CM | POA: Diagnosis not present

## 2018-07-04 DIAGNOSIS — E1129 Type 2 diabetes mellitus with other diabetic kidney complication: Secondary | ICD-10-CM | POA: Diagnosis not present

## 2018-07-04 DIAGNOSIS — Z09 Encounter for follow-up examination after completed treatment for conditions other than malignant neoplasm: Secondary | ICD-10-CM | POA: Insufficient documentation

## 2018-07-04 DIAGNOSIS — D899 Disorder involving the immune mechanism, unspecified: Secondary | ICD-10-CM | POA: Insufficient documentation

## 2018-07-04 DIAGNOSIS — Z9483 Pancreas transplant status: Secondary | ICD-10-CM | POA: Insufficient documentation

## 2018-07-04 DIAGNOSIS — E559 Vitamin D deficiency, unspecified: Secondary | ICD-10-CM | POA: Insufficient documentation

## 2018-07-04 LAB — BASIC METABOLIC PANEL
Anion gap: 9 (ref 5–15)
BUN: 52 mg/dL — ABNORMAL HIGH (ref 8–23)
CHLORIDE: 105 mmol/L (ref 98–111)
CO2: 25 mmol/L (ref 22–32)
Calcium: 9.2 mg/dL (ref 8.9–10.3)
Creatinine, Ser: 2.57 mg/dL — ABNORMAL HIGH (ref 0.61–1.24)
GFR calc non Af Amer: 25 mL/min — ABNORMAL LOW (ref >60)
GFR, EST AFRICAN AMERICAN: 29 mL/min — AB (ref >60)
Glucose, Bld: 106 mg/dL — ABNORMAL HIGH (ref 70–99)
POTASSIUM: 3.6 mmol/L (ref 3.5–5.1)
Sodium: 139 mmol/L (ref 135–145)

## 2018-07-04 LAB — CBC WITH DIFFERENTIAL/PLATELET
BASOS ABS: 0 10*3/uL (ref 0–0.1)
BASOS PCT: 1 %
Eosinophils Absolute: 0.2 10*3/uL (ref 0–0.7)
Eosinophils Relative: 4 %
HEMATOCRIT: 37.2 % — AB (ref 40.0–52.0)
HEMOGLOBIN: 11.6 g/dL — AB (ref 13.0–18.0)
Lymphocytes Relative: 22 %
Lymphs Abs: 1 10*3/uL (ref 1.0–3.6)
MCH: 28.4 pg (ref 26.0–34.0)
MCHC: 31.2 g/dL — ABNORMAL LOW (ref 32.0–36.0)
MCV: 91 fL (ref 80.0–100.0)
Monocytes Absolute: 0.5 10*3/uL (ref 0.2–1.0)
Monocytes Relative: 12 %
NEUTROS ABS: 2.9 10*3/uL (ref 1.4–6.5)
NEUTROS PCT: 61 %
Platelets: 103 10*3/uL — ABNORMAL LOW (ref 150–440)
RBC: 4.09 MIL/uL — ABNORMAL LOW (ref 4.40–5.90)
RDW: 15.2 % — AB (ref 11.5–14.5)
WBC: 4.6 10*3/uL (ref 3.8–10.6)

## 2018-07-04 LAB — PHOSPHORUS: Phosphorus: 2.7 mg/dL (ref 2.5–4.6)

## 2018-07-04 LAB — MAGNESIUM: MAGNESIUM: 1.8 mg/dL (ref 1.7–2.4)

## 2018-07-04 NOTE — Unmapped (Signed)
New standing lab orders.  Mailers ordered.

## 2018-07-04 NOTE — Unmapped (Signed)
Per order Dr. Carlene Coria, pt instructed to get labs today.  He has not taken prograf yet.  He will also start checking his BP

## 2018-07-05 DIAGNOSIS — Z940 Kidney transplant status: Principal | ICD-10-CM

## 2018-07-05 DIAGNOSIS — Z79899 Other long term (current) drug therapy: Secondary | ICD-10-CM

## 2018-07-05 LAB — TACROLIMUS, TROUGH: Lab: 8.8

## 2018-07-13 LAB — CBC W/ DIFFERENTIAL
BASOPHILS ABSOLUTE COUNT: 0 10*9/L
BASOPHILS RELATIVE PERCENT: 1 %
EOSINOPHILS ABSOLUTE COUNT: 0.2 10*9/L
EOSINOPHILS RELATIVE PERCENT: 4 %
HEMATOCRIT: 37.2 % — ABNORMAL LOW
HEMOGLOBIN: 11.6 g/dL — ABNORMAL LOW
LYMPHOCYTES RELATIVE PERCENT: 22 %
MEAN CORPUSCULAR HEMOGLOBIN CONC: 31.2 g/dL — ABNORMAL LOW
MEAN CORPUSCULAR HEMOGLOBIN: 28.4 pg
MEAN CORPUSCULAR VOLUME: 91 fL
MONOCYTES ABSOLUTE COUNT: 0.5 10*9/L
MONOCYTES RELATIVE PERCENT: 12 %
NEUTROPHILS ABSOLUTE COUNT: 2.9 10*9/L
NEUTROPHILS RELATIVE PERCENT: 61 %
RED BLOOD CELL COUNT: 4.09 10*12/L — ABNORMAL LOW
RED CELL DISTRIBUTION WIDTH: 15.2 % — ABNORMAL HIGH
WBC ADJUSTED: 4.6 10*9/L

## 2018-07-13 LAB — BASIC METABOLIC PANEL
BLOOD UREA NITROGEN: 52 mg/dL — ABNORMAL HIGH
CHLORIDE: 105 mmol/L
CO2: 25 mmol/L
CREATININE: 2.57 mg/dL — ABNORMAL HIGH
EGFR MDRD AF AMER: 29 mL/min/{1.73_m2} — ABNORMAL LOW
EGFR MDRD NON AF AMER: 25 mL/min/{1.73_m2} — ABNORMAL LOW
GLUCOSE RANDOM: 106 mg/dL — ABNORMAL HIGH
POTASSIUM: 3.6 mmol/L
SODIUM: 139 mmol/L

## 2018-07-13 LAB — EGFR MDRD NON AF AMER: Lab: 25 — ABNORMAL LOW

## 2018-07-13 LAB — MAGNESIUM: Lab: 1.8

## 2018-07-13 LAB — PHOSPHORUS: Lab: 2.7

## 2018-07-13 LAB — RED CELL DISTRIBUTION WIDTH: Lab: 15.2 — ABNORMAL HIGH

## 2018-07-13 NOTE — Unmapped (Signed)
Miguel Ward with [facility] care called asking for EF%, HR and BP of pt. States pt. Is in BP program with them and has asked medical Records for release and information. Called LM that all information needs to come from pt. Or pt. Can release information via medical records to [facility].

## 2018-07-14 NOTE — Unmapped (Signed)
[facility] Refill Coordination Note    Specialty Medication(s) to be Shipped:   Transplant: Prograf 1mg      Miguel Ward, DOB: 05/28/1951  Phone: [phone] (home)   Shipping Address: [address]  [facility] [place] [address]    All above HIPAA information was verified with patient.     Completed refill call assessment today to schedule patient's medication shipment from the [facility] [phone]).       Specialty medication(s) and dose(s) confirmed: Regimen is correct and unchanged.   Changes to medications: Miguel Ward reports no changes reported at this time.  Changes to insurance: No  Questions for the pharmacist: No    The patient will receive an FSI print out for each medication shipped and additional FDA Medication Guides as required.  Patient education from [place] or [employer] Leu may also be included in the shipment.    DISEASE/MEDICATION-SPECIFIC INFORMATION        N/A    ADHERENCE     Medication Adherence    Patient reported X missed doses in the last month:  0          MEDICARE PART B DOCUMENTATION     Prograf 1mg : Patient has 9 days worth of capsules on hand.    SHIPPING     Shipping address confirmed in FSI.     Delivery Scheduled: Yes, Expected medication delivery date: 07/20/2018 via UPS or courier.     Miguel Ward Miguel Ward   [facility] Specialty Technician

## 2018-07-18 NOTE — Unmapped (Signed)
Current Outpatient Medications on File Prior to Visit   Medication Sig   ??? acyclovir (ZOVIRAX) 400 MG tablet Take 1 tablet (400 mg total) by mouth daily.   ??? allopurinol (ZYLOPRIM) 100 MG tablet Take 1.5 tablets (150 mg total) by mouth daily.   ??? aspirin (ASPIRIN LOW-STRENGTH) 81 MG chewable tablet Chew 81 mg daily.   ??? atorvastatin (LIPITOR) 20 MG tablet Take 1 tablet (20 mg total) by mouth daily.   ??? calcitriol (ROCALTROL) 0.25 MCG capsule Take 1 capsule (0.25 mcg total) by mouth Every Monday, Wednesday, and Friday.   ??? CELLCEPT 250 mg capsule Take 1 capsule (250 mg total) by mouth Two (2) times a day.   ??? colchicine (COLCRYS) 0.6 mg tablet Take 1 tablet (0.6 mg total) by mouth daily as needed. for up to 1 dose   ??? enalapril (VASOTEC) 10 MG tablet Take 1 tablet (10 mg total) by mouth Two (2) times a day.   ??? ferrous sulfate 325 (65 FE) MG tablet Take 325 mg by mouth 3 (three) times a day.   ??? fluticasone (FLONASE) 50 mcg/actuation nasal spray 1 spray into each nostril daily as needed.   ??? furosemide (LASIX) 40 MG tablet Take 1 tablet (40 mg total) by mouth daily.   ??? levothyroxine (SYNTHROID) 200 MCG tablet Take 1 tab with one tab, total dose daily   ??? levothyroxine (SYNTHROID, LEVOTHROID) 25 MCG tablet Take 1 tab with one tab, total dose daily   ??? magnesium oxide (MAG-OX) 400 mg (241.3 mg magnesium) tablet Take 1 tablet (400 mg total) by mouth Two (2) times a day.   ??? metoprolol succinate (TOPROL-XL) 100 MG 24 hr tablet Take 1 tablet (100 mg total) by mouth daily.   ??? omeprazole (PRILOSEC) 20 MG capsule Take 1 capsule (20 mg total) by mouth daily.   ??? predniSONE (DELTASONE) 5 MG tablet Take 1 tablet (5 mg total) by mouth daily.   ??? PROGRAF 1 mg capsule Take 3 capsules (3 mg total) by mouth Two (2) times a day.   ??? simethicone (GAS-X) 80 MG chewable tablet Chew 1 tablet (80 mg total) every six (6) hours as needed for flatulence.   ??? sodium bicarbonate 650 mg tablet Take 1 tablet (650 mg total) by mouth Two (2) times a day.     No current facility-administered medications on file prior to visit.

## 2018-07-19 MED ORDER — LEVOTHYROXINE 25 MCG TABLET
ORAL_TABLET | 3 refills | 0 days | Status: CP
Start: 2018-07-19 — End: 2018-09-26

## 2018-07-20 ENCOUNTER — Ambulatory Visit: Admit: 2018-07-20 | Discharge: 2018-07-21 | Payer: MEDICARE

## 2018-07-20 DIAGNOSIS — L219 Seborrheic dermatitis, unspecified: Secondary | ICD-10-CM

## 2018-07-20 DIAGNOSIS — B009 Herpesviral infection, unspecified: Principal | ICD-10-CM

## 2018-07-20 MED ORDER — CELLCEPT 250 MG CAPSULE
Freq: Two times a day (BID) | ORAL | 3 refills | 0.00000 days | Status: CP
Start: 2018-07-20 — End: 2018-07-20

## 2018-07-20 MED ORDER — HYDROCORTISONE 2.5 % TOPICAL OINTMENT
1 refills | 0 days | Status: CP
Start: 2018-07-20 — End: 2019-04-27

## 2018-07-20 MED ORDER — CELLCEPT 250 MG CAPSULE: 250 mg | each | Freq: Two times a day (BID) | 3 refills | 0 days | Status: AC

## 2018-07-20 MED ORDER — TRIAMCINOLONE ACETONIDE 0.1 % TOPICAL OINTMENT
Freq: Two times a day (BID) | TOPICAL | 1 refills | 0 days | Status: CP
Start: 2018-07-20 — End: 2019-07-20

## 2018-07-20 MED FILL — PROGRAF/1MG/CAP: PROGRAF/1MG/CAP | 30 days supply | Qty: 180 | Fill #9

## 2018-07-20 NOTE — Unmapped (Signed)
DERMATOLOGY FOLLOW UP VISIT    ASSESSMENT AND PLAN      1. Immunosuppression    -TBSE today w/o concerning findings  - patient declined genital examination, says nothing concerning  -sun protection reviewed  -recommendation for routine surveillance TBSE given immunosuppression reviewed  -RTC sooner PRN concerning lesions    2. HSV-2 (herpes simplex virus 2) infection  -no active lesions  -recommend he continue acyclovir 400 daily long-term, as his renal function is stable in recent lab reviewed. and given the frequency of his flares prior to suppressive tx (2x plus per month) likely beneficial     3. LSC bilateral preauricular     -Bilateral lichenified plaques preauricular, insightful about itch scratch cycle  -TAC for 2 weeks, then transition to hydrocortone then stop.  Discussed proper use of topical steroids.    4. Stasis dermatitis (mild)     Recommend compression hose which have already been prescribed.   Liberal use of emmollient.  Can use hydrocortisone 2.5% of flared.         RTC: 1 year or sooner PRN.   _____________________________________________________________________  CHIEF COMPLAINT:   No chief complaint on file.      HPI  Miguel Ward is a 67 y.o. male who presents today for follow up of cutaneous HSV in setting of immunosuppression.  Overall things have been going well and he has not had any flares recently of HSV.  New concern today of itchy rash bilateral preauricular present for several months started out with some redness and irritation in the area the look like dandruff admits to frequently scratching which makes him more itchy.  He has not attempted any treatment.    Hx renal transplant for lupus nephritis in 2008, on CellCept, Prograf and prednisone. seen 08/2015 for vesicular rash of lumbar back, which was positive for HSV2 on PCR. Since then, has been on acyclovir 400 qDay, w/o any flares. Prior to starting therapy, had flare every 1-2weeks.  Has not had any flares recently on suppressive acyclovir.    No other skin concerns today.    PAST MEDICAL HISTORY  Reviewed in medical record. Pertinent dermatologic past medical history includes:   -Denies personal history of non-melanoma skin cancers  -Denies personal history of melanoma  -renal transplant 2008 for lupus nephritis, on CellCept, Prograf and prednisone      FAMILY HISTORY  Reviewed in medical record    SOCIAL HISTORY  Reviewed in medical record    REVIEW OF SYSTEMS  Feels well overall. Denies fevers, chills, or recent illnesses. No other skin concerns.     PHYSICAL EXAM  General: Well appearing male , alert and oriented x 3. No acute distress.   Skin: Per patient request: Examination of the scalp, face, neck, chest, back, bilateral upper and lower extremities, palms, soles, nails, and buttocks, examined and pertinent for:  -large AV fistula RUE  -flesh colored scaly stuck-on papules BLE  -no vesicles    Lichenified, scaly, hyperpigmented plaque with excoriation bilateral preauricular

## 2018-07-20 NOTE — Unmapped (Addendum)
Patient Education        Skin Cancer Prevention: Care Instructions  Your Care Instructions    Skin cancer is the abnormal growth of cells in the skin. It usually appears as a growth that changes in color, shape, or size. This can be a sore that does not heal or a change in a wart or a mole. Skin cancer is almost always curable when found early and treated. So it is important to see your doctor if you have any of these changes in your skin.  Skin cancer is the most common type of cancer. It often appears on areas of the body that have been exposed to the sun, such as the head, face, neck, back, chest, or shoulders.  Follow-up care is a key part of your treatment and safety. Be sure to make and go to all appointments, and call your doctor if you are having problems. It's also a good idea to know your test results and keep a list of the medicines you take.  How can you care for yourself at home?  ?? Wear a wide-brimmed hat and long sleeves and pants if you are going to be outdoors for a long time.  ?? Avoid the sun between 10 a.m. and 4 p.m., which is the peak time for UV rays.  ?? Wear sunscreen on exposed skin. Make sure to use a broad-spectrum sunscreen that has a sun protection factor (SPF) of 30 or higher. Use it every day, even when it is cloudy.  ?? Do not use tanning booths or sunlamps.  ?? Use lip balm or cream that has sun protection factor (SPF) to protect your lips from getting sunburned.  ?? Wear sunglasses that block UV rays.  When should you call for help?  Call your doctor now or seek immediate medical care if:  ?? ?? You have signs of infection, such as:  ? Increased pain, swelling, warmth, or redness.  ? Red streaks leading from the area.  ? Pus draining from the area.  ? A fever.   ??Watch closely for changes in your health, and be sure to contact your doctor if:  ?? ?? You see a change in your skin, such as a growth or mole that:  ? Grows bigger. This may happen very slowly.  ? Changes color.  ? Changes shape. ? Starts to bleed easily.   ?? ?? You have swollen glands in your armpits, groin, or neck.   ?? ?? You do not get better as expected.   Where can you learn more?  Go to [facility] at [email or web address]  Select Preferences in the upper right hand corner, then select Health Library under Resources. Enter P392 in the search box to learn more about Skin Cancer Prevention: Care Instructions.  Current as of: December 01, 2017  Content Version: 12.1  ?? 2006-2019 [employer]. Care instructions adapted under license by [facility]. If you have questions about a medical condition or this instruction, always ask your healthcare professional. [employer] disclaims any warranty or liability for your use of this information.             Patient Education        Seborrheic Dermatitis: Care Instructions  Your Care Instructions  Seborrheic dermatitis (say seh-buh-REE-ick der-muh-TY-tus) is a skin problem that causes a reddish rash with greasy, flaky, yellow skin patches.  The rash may appear on many parts of the body. It may be on the scalp, face (especially the  eyebrow area and between the nose and mouth), ears, breasts, underarms, and genital area. The flaky skin on the scalp is called dandruff.  This rash is often a long-term (chronic) condition. It may last for years. But the symptoms may come and go. Symptoms can be treated with special creams, shampoos, or other skin care.  The cause of seborrheic dermatitis is not fully understood. It may occur when skin glands make too much oil. It may get worse in cold weather or with stress. A type of skin fungus, or yeast, may also be linked with this condition.  Follow-up care is a key part of your treatment and safety. Be sure to make and go to all appointments, and call your doctor if you are having problems. It's also a good idea to know your test results and keep a list of the medicines you take.  How can you care for yourself at home?  ?? If your doctor prescribes a steroid cream, dandruff shampoo, or antifungal cream or medicine, use it as directed. If your doctor prescribes other medicine, take it as directed.  ?? Use a dandruff shampoo if seborrheic dermatitis affects your scalp. This includes Head & Shoulders, Sebulex, and Selsun Blue. You may need to try a few kinds of shampoo to find the one that works best for you.  ?? To help with itching:  ? Use hydrocortisone cream. Follow the directions on the label.  ? Use cold, wet cloths.  ? Take an over-the-counter antihistamine, such as diphenhydramine (Benadryl) or loratadine (Claritin). Read and follow all instructions on the label.  When should you call for help?  Call your doctor now or seek immediate medical care if:  ?? ?? You have signs of infection, such as:  ? Increased pain, swelling, warmth, or redness.  ? Red streaks leading from the rash.  ? Pus draining from the rash.  ? A fever.   ??Watch closely for changes in your health, and be sure to contact your doctor if:  ?? ?? The rash gets worse or spreads to other parts of your body.   ?? ?? You do not get better as expected.   Where can you learn more?  Go to [facility] at [email or web address]  Select Preferences in the upper right hand corner, then select Health Library under Resources. Enter [phone] in the search box to learn more about Seborrheic Dermatitis: Care Instructions.  Current as of: March 14, 2018  Content Version: 12.1  ?? 2006-2019 [employer]. Care instructions adapted under license by [facility]. If you have questions about a medical condition or this instruction, always ask your healthcare professional. [employer] disclaims any warranty or liability for your use of this information.

## 2018-07-27 NOTE — Unmapped (Signed)
Patient has been approved to receive CellCept from the manufacturer from  07/27/18 until 07/26/2019 and will be shipped directly to the patient's home.

## 2018-08-10 NOTE — Unmapped (Signed)
[facility] Refill and Clinical Coordination Note  Medication(s): PROGRAF   PT GETS CELLCEPT FROM [facility]    [name], DOB: 1951-04-08  Phone: [phone] (home) , Alternate phone contact: N/A  Shipping address: [address]  [place] [address]  Phone or address changes today?: No  All above HIPAA information verified.  Insurance changes? No    Completed refill and clinical call assessment today to schedule patient's medication shipment from the [facility] [phone]).      MEDICATION RECONCILIATION    Confirmed the medication and dosage are correct and have not changed: Yes, regimen is correct and unchanged.    Were there any changes to your medication(s) in the past month:  No, there are no changes reported at this time.    ADHERENCE    Is this medicine transplant or covered by Medicare Part B? Yes.    Prograf 1 mg   Quantity filled last month: 180   # of tablets left on hand: 12 DAYS LEFT    Did you miss any doses in the past 4 weeks? No missed doses reported.  Adherence counseling provided? Not needed     SIDE EFFECT MANAGEMENT    Are you tolerating your medication?:  Gram reports tolerating the medication.  Side effect management discussed: None      Therapy is appropriate and should be continued.    Evidence of clinical benefit: See Epic note from 05/20/18      FINANCIAL/SHIPPING    Delivery Scheduled: Yes, Expected medication delivery date: 08/18/18 VIA UPS     Additional medications refilled: No additional medications/refills needed at this time.    The patient will receive a drug information handout for each medication shipped and additional FDA Medication Guides as required.      Seeley did not have any additional questions at this time.    Delivery address confirmed in Epic.     We will follow up with patient monthly for standard refill processing and delivery.      Thank you,  [name]   [facility] Specialty Pharmacist

## 2018-08-16 MED ORDER — LEVOTHYROXINE 200 MCG TABLET
ORAL_TABLET | 3 refills | 0 days | Status: CP
Start: 2018-08-16 — End: 2018-09-26

## 2018-08-17 MED FILL — PROGRAF 1 MG CAPSULE: 30 days supply | Qty: 180 | Fill #0

## 2018-08-17 MED FILL — PROGRAF 1 MG CAPSULE: 30 days supply | Qty: 180 | Fill #0 | Status: AC

## 2018-09-07 NOTE — Unmapped (Signed)
Miguel Ward has more than two weeks of Prograf on hand.  Will push refill call out for one week

## 2018-09-14 NOTE — Unmapped (Signed)
PT has over two weeks of medication (Prograf 1mg ) on hand and has not reported missing a dose. PT suggested we call him at the end of next week to schedule a refill. 09/14/18 jm

## 2018-09-20 NOTE — Unmapped (Signed)
Transplant Nephrology Clinic Visit      History of Present Illness    Patient is a 67 y.o. male who underwent deceased donor transplant on 2007/06/26 secondary to lupus nephritis. His post transplant course has been complicated by early cellular rejection treated with Thymoglobulin. At that time, he was enrolled in the JAK-3 inhibitor trial and was taken off study drug. Additionally, he was noted to have a large pericardial effusion of greater than 1 L in 08/2007. At that time his TSH was 258. This improved with pericardiocentesis and thyroid replacement and has not reaccumulated. In 2009, he had coronary artery disease which required stenting. Patient does not have evidence of donor specific antibodies. His creatinine was between 1.5 and 2.2, but has been more in the low to mid 2s since his right native nephrectomy on 02/20/14 for renal cell carcinoma.  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 Recent baseline creatinine has been between 2.3 and 2.4.    He was admitted to [facility] from 9/15 through 08/31/2016 after presenting with fever and leukocytosis. His initial urine culture specimen was lost, but he was treated for presumptive UTI. He improved clinically on antibiotics. During that evaluation he had a CT of the abdomen on 08/29/2016 that showed New indeterminate liver lesions concerning for metastatic disease versus post transplant lymphoproliferative disorder versus abscesses. He then underwent MRI on 9/18 which showed Two liver lesions with minimal enhancement. Differential includes post transplant lymphoproliferative disorder, sequelae of prior infection or hematoma, less likely acute abscess. Metastases unlikely given enhancement characteristics. EBV was negative.    As an outpatient he then underwent a PET scan on 09/14/2016 which showed Enlarging right hepatic lobe lesion with intense FDG uptake and central area of photopenia, concerning for necrotic metastasis, less likely abscess. Stable right hepatic dome lesion with intense FDG uptake, concerning for additional metastasis. After consult with radiology, they felt the best approach was ultrasound guided biopsy which he had on 10/01/2016. Pathology revealed Fragments of liver tissue with parenchymal extinction, mixed inflammatory infiltrates, and reactive-appearing stromal changes (see comment), - Increased iron staining within adjacent intact hepatic parenchyma (grade 3 of 4), - Sinusoidal congestion. The differential diagnosis includes sequelae from an abscess (e.g., inflammatory pseudotumor) versus nonspecific inflammatory changes adjacent to an unsampled mass. Correlation with the radiologic findings and clinical follow up are suggested.    The patient had a repeat MRI on 02/01/17 to visualize lesions seen on MRI in September. His repeat MRI revealed interval moderate decrease in the size of previously detected liver lesions, which could represent the sequelae of resolving right hepatic lobe infection with interval decrease in size of large right hepatic lobe lesion with internal T1 hyperintensity likely reflecting proteinacious material and mild internal and perilesional enhancement. Interval decrease in size and conspicuity of small hepatic dome lesion is also noted and this could also represent a resolving abscess. They recommended a re-scan in approximately 3 months (04/2017). I reviewed the report and images with Dr. Rush Barer who agreed that the lesion continues to look like resolving infection.     Patient underwent renal biopsy on 12/30/2017 to evaluate proteinuria which had increased over the last year from 0.75 to 1.4. 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 no proliferative  glomerular lesions. These changes can indicate a minor recurrence of the lupus nephritis not carrying any direct therapeutic and prognostic significance at the present time.); Tubular pigment deposits, consistent with lipofuscin.    He was admitted 02/11/18-02/14/18 with RSV infection, AKI and new diastolic and systolic heart failure. TTE showed moderate EF 40-45%, reduced from 52% reported in January 2015. Patient's home lasix was increased to 40 mg qd from 20 mg qd with improvement in symptoms. Creatinine peaked at 2.73, 2.69 on discharge. It was suspected that patient's RSV infection worsened his heart failure and caused AKI is due to reduced perfusion. Patient's enalapril was held on discharge.     Interval history since last visit 05/20/2018    Since last visit, Enalapril increased back to his previous full dose of 10 mg bid given ongoing lower extremity edema and UP/C of 1.5.     Patient presents today for follow up visit. His main complaint is increased fluid retention. Describes intermittent swelling to lower legs, that travels up thighs and abdomen. He notes associated shortness of breath, most notable when bending down to tie his shoes. No worse with exertion, no Keianna Signer orthopnea or PND. Denies chest pain or pressure. Notes numbness/tingling, sharp shooting sensation down bilateral legs that he associates with the increased edema.Patient was last seen by cardiology 02/24/18. His echo on 02/14/2018 showed globally, mildly decreased EF 40-45% and grade III diastolic dysfunction. Recommend patient weigh himself daily and continue 40 mg lasix daily. Patient was supposed to follow up in 2 months, but I don't see an appointment was made.     He reports several days of diarrhea last week. No fever, N/V or abdominal pain. Able to keep down all his transplant medications. Treated symptoms with imodium. He did note a 10 pound weight loss after diarrhea, from 191 to 180.4. Diarrhea resolved and he has been increasing his oral intake and gaining weight back.     I discussed his abdominal MRI from his last visit 05/20/2018 with [facility]. They recommended recommend HIDA scan given history of cholecystitis that could have led to liver inflammation, with the thought that if he did have cholelithiasis they would recommend cholecystectomy. HIDA scan from 06/13/18 was normal, without evidence of cystic duct or common bile duct obstruction to suggest cholecystitis. That scan also reviewed with [facility] who felt no further evaluation warranted at present.    Review of Systems    Otherwise on review of systems patient denies fever or chills, chest pain, SOB, PND or orthopnea. No N/V/abdominal pain. No current diarrhea. No dysuria, hematuria or difficulty voiding. All other systems are reviewed and are negative.    Medications    Current Outpatient Medications   Medication Sig Dispense Refill   ??? acyclovir (ZOVIRAX) 400 MG tablet Take 1 tablet (400 mg total) by mouth daily. 90 tablet 3   ??? allopurinol (ZYLOPRIM) 100 MG tablet Take 1.5 tablets (150 mg total) by mouth daily. 135 tablet 3   ??? aspirin (ASPIRIN LOW-STRENGTH) 81 MG chewable tablet Chew 81 mg daily.     ??? atorvastatin (LIPITOR) 20 MG tablet Take 1 tablet (20 mg total) by mouth daily. 90 tablet 3   ??? calcitriol (ROCALTROL) 0.25 MCG capsule Take 1 capsule (0.25 mcg total) by mouth Every Monday, Wednesday, and Friday. 36 capsule 3   ??? CELLCEPT 250 mg capsule TAKE 1 CAPSULE BY MOUTH TWICE DAILY 180 each 3   ??? colchicine (COLCRYS) 0.6 mg tablet Take 1 tablet (0.6 mg total) by mouth  daily as needed. for up to 1 dose 30 tablet 11   ??? enalapril (VASOTEC) 10 MG tablet Take 1 tablet (10 mg total) by mouth Two (2) times a day. 180 tablet 3   ??? ferrous sulfate 325 (65 FE) MG tablet Take 325 mg by mouth 3 (three) times a day.     ??? fluticasone (FLONASE) 50 mcg/actuation nasal spray 1 spray into each nostril daily as needed.     ??? furosemide (LASIX) 40 MG tablet Take 1 tablet (40 mg total) by mouth daily. 90 tablet 3   ??? hydrocortisone 2.5 % ointment Only when needed in front of ear; can used more broadly on lower legs if needed for itching. 453 g 1   ??? levothyroxine (SYNTHROID) 200 MCG tablet Take 1 tab with two tab, total dose daily 90 tablet 3   ??? levothyroxine (SYNTHROID, LEVOTHROID) 25 MCG tablet Take 2 tabs with one tab, total dose daily 90 tablet 3   ??? magnesium oxide (MAG-OX) 400 mg (241.3 mg magnesium) tablet Take 1 tablet (400 mg total) by mouth Two (2) times a day. 180 tablet 3   ??? metoprolol succinate (TOPROL-XL) 100 MG 24 hr tablet Take 1 tablet (100 mg total) by mouth daily. 90 tablet 3   ??? omeprazole (PRILOSEC) 20 MG capsule Take 1 capsule (20 mg total) by mouth daily. 90 capsule 3   ??? predniSONE (DELTASONE) 5 MG tablet Take 1 tablet (5 mg total) by mouth daily. 90 tablet 3   ??? PROGRAF 1 mg capsule TAKE 3 CAPSULES (3MG ) BY MOUTH TWICE DAILY 180 capsule 11   ??? simethicone (GAS-X) 80 MG chewable tablet Chew 1 tablet (80 mg total) every six (6) hours as needed for flatulence. 30 tablet 1   ??? sodium bicarbonate 650 mg tablet Take 1 tablet (650 mg total) by mouth Two (2) times a day. 180 tablet 3   ??? triamcinolone (KENALOG) 0.1 % ointment Apply topically Two (2) times a day. For only 2 week BID topically from itchy area in front of ears. 80 g 1     No current facility-administered medications for this visit.        Physical Exam    BP 146/82 (BP Site: L Arm, BP Position: Sitting, BP Cuff Size: Medium)  - Pulse 75  - Temp 36.7 ??C (98.1 ??F) (Temporal)  - Ht 182.9 cm (6')  - Wt 85.8 kg (189 lb 3.2 oz)  - BMI 25.66 kg/m??    General: Patient is a pleasant male in no apparent distress.  Appears younger than stated age.  Eyes: Sclera anicteric.  ENT: No erythema or exudate.  Neck: Supple without LAD/JVD/bruits.  Lungs: Clear to auscultation bilaterally, no wheezes/rales/rhonchi.  Cardiovascular: Regular rate and rhythm without murmurs, rubs or gallops.  Abdomen: Distended, soft, nontender. Bowel sounds present. No tenderness over the graft.  Extremities: 1+ edema bilaterally. Joints without evidence of synovitis.   Skin: Without rash.  Neurological: Grossly nonfocal.  Psychiatric: Mood and affect appropriate.    Laboratory Results    Recent Results (from the past 170 hour(s))   Cytology-Urine    Collection Time: 09/21/18  9:10 AM   Result Value Ref Range    Case Report       Non-gynecologic Cytology                          Case: [id number]  Authorizing Provider:  Posey Boyer Jakhi Dishman, MD       Collected:           09/21/2018 0910              Ordering Location:     [facility] TRANSPLANT [facility]GERY    Received:            09/21/2018 1450                                     [place]                                                                  Specimen:    Urine clean catch, voided decoy                                                            Final Diagnosis       A: Urine clean catch, voided decoy  - No malignant cells identified.  - No decoy cells identified.     This electronic signature is attestation that the pathologist personally reviewed the submitted material(s) and the final diagnosis reflects that evaluation.      Clinical History       Urine for Decoy cells, s/p Kidney Transplant      Gross Description       Urine clean catch voided decoy    60 mL cloudy, yellow fluid, unfixed    Materials Prepared & Examined    Smear Slides.......0  Monolayers..........1  Cytospins.............0  Cell Blocks...........0  Core Biopsy.........0  Touch Prep..........0        Microscopic Description       Microscopic examination substantiates the above diagnosis.    Resident Physician: None Assigned      EMBEDDED IMAGES      Specimen Adequacy      Disclaimer       Unless otherwise specified, specimens are preserved using 10% neutral buffered formalin. For cases in which immunohistochemical and/or in-situ hybridization stains are performed, the following statement applies: Appropriate controls for each stain (positive controls with or without negative controls) have been evaluated and stain as expected. These stains have not been separately validated for use on decalcified specimens and should be interpreted with caution in that setting. Some of the reagents used for these stains may be classified as analyte specific reagents (ASR). Tests using ASRs were developed, and their performance characteristics were determined, by the Anatomic Pathology Department [facility]). They have not been cleared or approved by the [place] Food and Drug Administration (FDA). The FDA does not require these tests to go through premarket FDA review. These tests are used for clinical purposes. They should not be regarded as investigational or for research. This laboratory is certified under the Clinical Laboratory Improvement Amendments (CLIA) as qualified to perform high complexity clinical laboratory testing.     Protein/Creatinine Ratio, Urine    Collection Time: 09/21/18  9:10 AM   Result Value Ref  Range    Creat U 78.7 Undefined mg/dL    Protein, Ur 13.0 Undefined mg/dL    Protein/Creatinine Ratio, Urine 0.939 Undefined   Urine Culture    Collection Time: 09/21/18  9:10 AM   Result Value Ref Range    Urine Culture, Comprehensive Mixed Urogenital Flora    Urinalysis    Collection Time: 09/21/18  9:10 AM   Result Value Ref Range    Color, UA Yellow     Clarity, UA Clear     Specific Gravity, UA 1.011 1.003 - 1.030    pH, UA 6.0 5.0 - 9.0    Leukocyte Esterase, UA Negative Negative    Nitrite, UA Negative Negative    Protein, UA 30 mg/dL (A) Negative    Glucose, UA Negative Negative    Ketones, UA Negative Negative    Urobilinogen, UA 0.2 mg/dL 0.2 mg/dL, 1.0 mg/dL    Bilirubin, UA Negative Negative    Blood, UA Negative Negative    RBC, UA 1 <=3 /HPF    WBC, UA <1 <=2 /HPF    Squam Epithel, UA <1 0 - 5 /HPF    Bacteria, UA None Seen None Seen /HPF   BNP (Pro-BNP)    Collection Time: 09/21/18  9:49 AM   Result Value Ref Range    PRO-BNP 23,500.0 (H) 0.0 - 177.0 pg/mL   TSH    Collection Time: 09/21/18  9:49 AM   Result Value Ref Range    TSH 16.430 (H) 0.600 - 3.300 uIU/mL   CMV DNA, quantitative, PCR    Collection Time: 09/21/18  9:49 AM   Result Value Ref Range    CMV Viral Ld Not Detected Not Detected    CMV Quant      CMV Quant Log10      CMV Comment     Tacrolimus Level, Trough    Collection Time: 09/21/18  9:49 AM   Result Value Ref Range    Tacrolimus, Trough 8.8 5.0 - 15.0 ng/mL   Basic Metabolic Panel    Collection Time: 09/21/18  9:49 AM   Result Value Ref Range    Sodium 140 135 - 145 mmol/L    Potassium 4.3 3.5 - 5.0 mmol/L    Chloride 106 98 - 107 mmol/L    CO2 28.0 22.0 - 30.0 mmol/L    Anion Gap 6 (L) 7 - 15 mmol/L    BUN 54 (H) 7 - 21 mg/dL    Creatinine 8.65 (H) 0.70 - 1.30 mg/dL    BUN/Creatinine Ratio 19     EGFR CKD-EPI Non-African American, Male 22 (L) >=60 mL/min/1.11m2    EGFR CKD-EPI African American, Male 26 (L) >=60 mL/min/1.83m2    Glucose 88 65 - 99 mg/dL    Calcium 9.9 8.5 - 78.4 mg/dL   Magnesium Level    Collection Time: 09/21/18  9:49 AM   Result Value Ref Range    Magnesium 1.6 1.6 - 2.2 mg/dL   Phosphorus Level    Collection Time: 09/21/18  9:49 AM   Result Value Ref Range    Phosphorus 2.9 2.9 - 4.7 mg/dL   CBC w/ Differential    Collection Time: 09/21/18  9:49 AM   Result Value Ref Range    WBC 6.5 4.5 - 11.0 10*9/L    RBC 3.93 (L) 4.50 - 5.90 10*12/L    HGB 11.6 (L) 13.5 - 17.5 g/dL    HCT 69.6 (L) 29.5 - 53.0 %    MCV  96.6 80.0 - 100.0 fL    MCH 29.4 26.0 - 34.0 pg    MCHC 30.4 (L) 31.0 - 37.0 g/dL    RDW 16.1 (H) 09.6 - 15.0 %    MPV 10.4 (H) 7.0 - 10.0 fL    Platelet 109 (L) 150 - 440 10*9/L    Neutrophils % 67.1 %    Lymphocytes % 19.3 %    Monocytes % 8.4 %    Eosinophils % 2.5 %    Basophils % 0.9 %    Absolute Neutrophils 4.3 2.0 - 7.5 10*9/L Absolute Lymphocytes 1.3 (L) 1.5 - 5.0 10*9/L    Absolute Monocytes 0.5 0.2 - 0.8 10*9/L    Absolute Eosinophils 0.2 0.0 - 0.4 10*9/L    Absolute Basophils 0.1 0.0 - 0.1 10*9/L    Large Unstained Cells 2 0 - 4 %    Macrocytosis Slight (A) Not Present    Hypochromasia Marked (A) Not Present   Morphology Review    Collection Time: 09/21/18  9:49 AM   Result Value Ref Range    Smear Review Comments See Comment (A) Undefined    Burr Cells Present (A) Not Present   T4, free    Collection Time: 09/21/18  9:49 AM   Result Value Ref Range    Free T4 1.76 (H) 0.71 - 1.40 ng/dL   T3    Collection Time: 09/21/18  9:49 AM   Result Value Ref Range    T3, Total 0.7 (L) 1.0 - 1.7 ng/mL         Assessment and Plan    1. Status post renal transplant. His creatinine today is slightly above his most recent baseline, possibly due to recent illness vs. fluid shifts vs.increasing enalapril vs. elevated tacrolimus level (or a combination). His Prograf level of 8.8 is a 10.5 hour trough and above goal of 6-8, though other outpatient levels have been at goal. Will continue current immunosuppression for now and recheck labs in the next 1-2 weeks. He remains on a reduced dose of CellCept 250mg  BID with history of diarrhea and malignancy.    2. Fluid overload and HFrEF. He is up 8-9 pounds since last visit in spite of recent GI illness. His BNP is 23,500 which is higher than it was when he was hospitalized in March (11,800 at that time). His lower extremity edema is not that impressive but her certainly has a distended abdomen. Unfortunately an albumin was not sent today but his UP/C is improved so I doubt that hypoalbuminemia is playing a significant role and liver disease unlikely with recent reassuring imaging. At his cardiology visit in March it was felt that the etiology of his heart failure was related to hypertensive heart disease given his global dysfunction, LVH and diastolic dysfunction. For now will increase his Lasix from 40 mg daily to 40 mg BID. He was instructed to weigh himself and let us know if his weight was increasing. He should also get labs in 1-2 weeks to follow up on potassium and creatinine and get an accurate tacrolimus trough. Will reach out to cardiology to schedule follow up.    3. Right renal cell carcinoma status post nephrectomy. Imaging previously showed hepatic mass that was biopsied and consistent with inflammatory process. MRI 05/20/2018 remains reassuring, surgery did not recommend any additional diagnostic workup, will just repeat MRI in one year (05/2019).    4. Hypertension. Mildly elevated in clinic today at 146/82. Should improve with diuresis.  5. Hypothyroidism. TSH elevated at 16.43, T3 low at 0.7. Will increase Synthroid from 225 mcg daily to 250 mcg daily.    6. History of coronary artery disease. No chest discomfort reported.    7. Hypomagnesemia. Magnesium normal today at 1.6. Has had diarrhea with higher levels of supplementation in the past. Tolerating 400 mg BID. Continue to encourage ingestion of high magnesium foods (educational information given in the past).    8. Proteinuria.  His urine protein to creatinine ratio had been trending up since March of last year, was 0.75, then 0.9, 1.4 and 1.3. Most recently 1.5 on 05/20/18. Renal biopsy on 12/30/17 showed immune complex mediated glomerulopathy that may be consistent with minor recurrence of lupus nephritis. Do not feel comfortable pushing Cellcept dose higher given diarrhea in the past. Back on higher dose of enalapril, UP/C down to 0.939 today.    9. Health maintenance. Patient has received 2 doses shingrix. He received flu shot in clinic today.    10. Will see patient back in 8 weeks or sooner if needed.    Scribe's Attestation: Lisbeth Ply, MD obtained and performed the history, physical exam and medical decision making elements that were entered into the chart.  Signed by Jerl Santos, Scribe, on September 21, 2018 at 9:48 AM. ----------------------------------------------------------------------------------------------------------------------  September 29, 2018 11:03 AM. Documentation assistance provided by the Scribe. I was present during the time the encounter was recorded. The information recorded by the Scribe was done at my direction and has been reviewed and validated by me.  ----------------------------------------------------------------------------------------------------------------------

## 2018-09-21 ENCOUNTER — Other Ambulatory Visit: Admit: 2018-09-21 | Discharge: 2018-09-21 | Payer: MEDICARE

## 2018-09-21 ENCOUNTER — Ambulatory Visit: Admit: 2018-09-21 | Discharge: 2018-09-21 | Payer: MEDICARE | Attending: Nephrology | Primary: Nephrology

## 2018-09-21 DIAGNOSIS — Z940 Kidney transplant status: Secondary | ICD-10-CM

## 2018-09-21 DIAGNOSIS — I509 Heart failure, unspecified: Principal | ICD-10-CM

## 2018-09-21 DIAGNOSIS — E039 Hypothyroidism, unspecified: Secondary | ICD-10-CM

## 2018-09-21 DIAGNOSIS — R609 Edema, unspecified: Secondary | ICD-10-CM

## 2018-09-21 DIAGNOSIS — I10 Essential (primary) hypertension: Secondary | ICD-10-CM

## 2018-09-21 DIAGNOSIS — Z0000 Encounter for general adult medical examination without abnormal findings: Secondary | ICD-10-CM

## 2018-09-21 DIAGNOSIS — D899 Disorder involving the immune mechanism, unspecified: Secondary | ICD-10-CM

## 2018-09-21 DIAGNOSIS — R801 Persistent proteinuria, unspecified: Secondary | ICD-10-CM

## 2018-09-21 DIAGNOSIS — R600 Localized edema: Secondary | ICD-10-CM

## 2018-09-21 LAB — T3 TOTAL: Triiodothyronine:MCnc:Pt:Ser/Plas:Qn:: 0.7 — ABNORMAL LOW

## 2018-09-21 LAB — CBC W/ AUTO DIFF
BASOPHILS ABSOLUTE COUNT: 0.1 10*9/L (ref 0.0–0.1)
BASOPHILS RELATIVE PERCENT: 0.9 %
EOSINOPHILS ABSOLUTE COUNT: 0.2 10*9/L (ref 0.0–0.4)
EOSINOPHILS RELATIVE PERCENT: 2.5 %
HEMATOCRIT: 38 % — ABNORMAL LOW (ref 41.0–53.0)
HEMOGLOBIN: 11.6 g/dL — ABNORMAL LOW (ref 13.5–17.5)
LYMPHOCYTES RELATIVE PERCENT: 19.3 %
MEAN CORPUSCULAR HEMOGLOBIN CONC: 30.4 g/dL — ABNORMAL LOW (ref 31.0–37.0)
MEAN CORPUSCULAR HEMOGLOBIN: 29.4 pg (ref 26.0–34.0)
MEAN CORPUSCULAR VOLUME: 96.6 fL (ref 80.0–100.0)
MEAN PLATELET VOLUME: 10.4 fL — ABNORMAL HIGH (ref 7.0–10.0)
MONOCYTES ABSOLUTE COUNT: 0.5 10*9/L (ref 0.2–0.8)
NEUTROPHILS ABSOLUTE COUNT: 4.3 10*9/L (ref 2.0–7.5)
NEUTROPHILS RELATIVE PERCENT: 67.1 %
PLATELET COUNT: 109 10*9/L — ABNORMAL LOW (ref 150–440)
RED BLOOD CELL COUNT: 3.93 10*12/L — ABNORMAL LOW (ref 4.50–5.90)
RED CELL DISTRIBUTION WIDTH: 15.5 % — ABNORMAL HIGH (ref 12.0–15.0)
WBC ADJUSTED: 6.5 10*9/L (ref 4.5–11.0)

## 2018-09-21 LAB — PRO-BNP: Natriuretic peptide.B prohormone N-Terminal:MCnc:Pt:Ser/Plas:Qn:: 23500 — ABNORMAL HIGH

## 2018-09-21 LAB — URINALYSIS
BACTERIA: NONE SEEN /HPF
BILIRUBIN UA: NEGATIVE
BLOOD UA: NEGATIVE
GLUCOSE UA: NEGATIVE
KETONES UA: NEGATIVE
LEUKOCYTE ESTERASE UA: NEGATIVE
NITRITE UA: NEGATIVE
PH UA: 6 (ref 5.0–9.0)
PROTEIN UA: 30 — AB
RBC UA: 1 /HPF (ref <=3)
SPECIFIC GRAVITY UA: 1.011 (ref 1.003–1.030)
SQUAMOUS EPITHELIAL: <1 /HPF (ref 0–5)
UROBILINOGEN UA: 0.2
WBC UA: <1 /HPF (ref <=2)

## 2018-09-21 LAB — BASIC METABOLIC PANEL
ANION GAP: 6 mmol/L — ABNORMAL LOW (ref 7–15)
BLOOD UREA NITROGEN: 54 mg/dL — ABNORMAL HIGH (ref 7–21)
CHLORIDE: 106 mmol/L (ref 98–107)
CO2: 28 mmol/L (ref 22.0–30.0)
CREATININE: 2.86 mg/dL — ABNORMAL HIGH (ref 0.70–1.30)
EGFR CKD-EPI NON-AA MALE: 22 mL/min/{1.73_m2} — ABNORMAL LOW (ref >=60)
GLUCOSE RANDOM: 88 mg/dL (ref 65–99)
POTASSIUM: 4.3 mmol/L (ref 3.5–5.0)
SODIUM: 140 mmol/L (ref 135–145)

## 2018-09-21 LAB — MAGNESIUM
MAGNESIUM: 1.6 mg/dL (ref 1.6–2.2)
Magnesium:MCnc:Pt:Ser/Plas:Qn:: 1.6

## 2018-09-21 LAB — SLIDE REVIEW

## 2018-09-21 LAB — CREATININE, URINE: Lab: 78.7

## 2018-09-21 LAB — FREE T4: Thyroxine.free:MCnc:Pt:Ser/Plas:Qn:: 1.76 — ABNORMAL HIGH

## 2018-09-21 LAB — PHOSPHORUS: Phosphate:MCnc:Pt:Ser/Plas:Qn:: 2.9

## 2018-09-21 LAB — EGFR CKD-EPI NON-AA MALE: Lab: 22 — ABNORMAL LOW

## 2018-09-21 LAB — SPECIFIC GRAVITY UA: Lab: 1.011

## 2018-09-21 LAB — THYROID STIMULATING HORMONE: Thyrotropin:ACnc:Pt:Ser/Plas:Qn:: 16.43 — ABNORMAL HIGH

## 2018-09-21 LAB — WBC ADJUSTED: Lab: 6.5

## 2018-09-21 LAB — PROTEIN / CREATININE RATIO, URINE: PROTEIN/CREAT RATIO, URINE: 0.939

## 2018-09-21 LAB — TACROLIMUS, TROUGH: Lab: 8.8

## 2018-09-21 LAB — SMEAR REVIEW

## 2018-09-21 LAB — TACROLIMUS LEVEL, TROUGH: TACROLIMUS, TROUGH: 8.8 ng/mL (ref 5.0–15.0)

## 2018-09-21 MED ORDER — FUROSEMIDE 40 MG TABLET
ORAL_TABLET | Freq: Every day | ORAL | 3 refills | 0 days | Status: SS
Start: 2018-09-21 — End: 2018-10-18

## 2018-09-21 NOTE — Unmapped (Signed)
Saw patient in clinic today. Patient reports he has been miserable since his last vist. He was told his heart was weak (CHF) and his legs have been bothering him. States bilateral leg pain described as pins/needles, like my muscles are stiff every time he goes from sitting to standing.  Reports fluid build in legs with sitting, my knees are tight. States he has intermittent LL edema and then upper leg edema.  Reports abdominal bloating. Reports associated SOB with these episodes. Denies SOB with exertion.     Reports diarrhea last week but is better now with a 10 pound weight loss. States BM normal now    Denies CP, HA, tremors, abdominal pain, n/v, or urinary problems.    Need flu shot today, labs after appointment.    Took prograf at 1130pm

## 2018-09-21 NOTE — Unmapped (Signed)
Called patient and updated him on new lasix dose.    Per Dr. Carlene Coria patient to increase lasix 40mg  BID and have repeat labs on Monday.    Patient verbalized understanding and would like prescription sent to [facility] pharmacy in [place].    Denies any other questions or concerns at this time.

## 2018-09-22 LAB — CMV COMMENT: Lab: 0

## 2018-09-22 LAB — CMV DNA, QUANTITATIVE, PCR: CMV VIRAL LD: NOT DETECTED

## 2018-09-23 NOTE — Unmapped (Signed)
[facility] Refill Coordination Note    Specialty Medication(s) to be Shipped:   Transplant: Prograf 1mg   **Pt confirmed getting Cellcept from [facility] **     Miguel Ward, DOB: Oct 18, 1951  Phone: [phone] (home)   Shipping Address: [address]  [place] [address]    All above HIPAA information was verified with patient.     Completed refill call assessment today to schedule patient's medication shipment from the [facility] [phone]).       Specialty medication(s) and dose(s) confirmed: Regimen is correct and unchanged.   Changes to medications: Tex reports no changes reported at this time.  Changes to insurance: No  Questions for the pharmacist: No    The patient will receive a drug information handout for each medication shipped and additional FDA Medication Guides as required.      DISEASE/MEDICATION-SPECIFIC INFORMATION        N/A    ADHERENCE     Medication Adherence    Patient reported X missed doses in the last month:  0  Specialty Medication:  Prograf 1mg   Patient is on additional specialty medications:  No  Patient is on more than two specialty medications:  No  Any gaps in refill history greater than 2 weeks in the last 3 months:  no  Demonstrates understanding of importance of adherence:  yes  Informant:  patient  Reliability of informant:  reliable  Adherence tools used:  patient uses a pill box to manage medications, calendar  Confirmed plan for next specialty medication refill:  delivery by pharmacy          Refill Coordination    Has the Patients' Contact Information Changed:  No  Is the Shipping Address Different:  No         MEDICARE PART B DOCUMENTATION     Prograf 1mg : Patient has 10 days worth of capsules on hand.    SHIPPING     Shipping address confirmed in Epic.     Delivery Scheduled: Yes, Expected medication delivery date: 09/30/2018 via UPS or courier.     Makaelah Cranfield [name]   [facility] Specialty Technician

## 2018-09-26 MED ORDER — LEVOTHYROXINE 25 MCG TABLET
ORAL_TABLET | 3 refills | 0 days | Status: CP
Start: 2018-09-26 — End: 2019-02-20

## 2018-09-26 MED ORDER — LEVOTHYROXINE 200 MCG TABLET
ORAL_TABLET | 3 refills | 0 days | Status: CP
Start: 2018-09-26 — End: ?

## 2018-09-26 NOTE — Unmapped (Signed)
Per Dr. Carlene Coria, patient to increase levothyroxine to 250 mcg daily.    Called patient and updated him of change in dose. Patient verbalized that he will now take two 25 mcg tablets with one 200 mcg tablet to equal 250 mcg dose.    Updated scripts sent to [facility].    patient denies any other questions or concerns

## 2018-09-29 ENCOUNTER — Other Ambulatory Visit
Admission: RE | Admit: 2018-09-29 | Discharge: 2018-09-29 | Disposition: A | Discharge status: Home / Self Care | Payer: Medicare Other | Source: Ambulatory Visit | Attending: Nephrology | Admitting: Nephrology

## 2018-09-29 ENCOUNTER — Encounter: Admit: 2018-09-29 | Discharge: 2018-09-29 | Payer: MEDICARE | Attending: Nephrology | Primary: Nephrology

## 2018-09-29 DIAGNOSIS — Z940 Kidney transplant status: Secondary | ICD-10-CM | POA: Diagnosis not present

## 2018-09-29 DIAGNOSIS — N189 Chronic kidney disease, unspecified: Secondary | ICD-10-CM | POA: Diagnosis not present

## 2018-09-29 DIAGNOSIS — Z9483 Pancreas transplant status: Secondary | ICD-10-CM | POA: Diagnosis not present

## 2018-09-29 DIAGNOSIS — N390 Urinary tract infection, site not specified: Secondary | ICD-10-CM | POA: Insufficient documentation

## 2018-09-29 DIAGNOSIS — D899 Disorder involving the immune mechanism, unspecified: Secondary | ICD-10-CM | POA: Diagnosis present

## 2018-09-29 DIAGNOSIS — Z789 Other specified health status: Secondary | ICD-10-CM | POA: Diagnosis not present

## 2018-09-29 DIAGNOSIS — B259 Cytomegaloviral disease, unspecified: Secondary | ICD-10-CM | POA: Insufficient documentation

## 2018-09-29 DIAGNOSIS — Z09 Encounter for follow-up examination after completed treatment for conditions other than malignant neoplasm: Secondary | ICD-10-CM | POA: Diagnosis not present

## 2018-09-29 DIAGNOSIS — E559 Vitamin D deficiency, unspecified: Secondary | ICD-10-CM | POA: Insufficient documentation

## 2018-09-29 DIAGNOSIS — E1129 Type 2 diabetes mellitus with other diabetic kidney complication: Secondary | ICD-10-CM | POA: Insufficient documentation

## 2018-09-29 DIAGNOSIS — B9689 Other specified bacterial agents as the cause of diseases classified elsewhere: Secondary | ICD-10-CM | POA: Diagnosis not present

## 2018-09-29 DIAGNOSIS — Z79899 Other long term (current) drug therapy: Secondary | ICD-10-CM | POA: Diagnosis not present

## 2018-09-29 DIAGNOSIS — D631 Anemia in chronic kidney disease: Secondary | ICD-10-CM | POA: Insufficient documentation

## 2018-09-29 DIAGNOSIS — Z114 Encounter for screening for human immunodeficiency virus [HIV]: Secondary | ICD-10-CM | POA: Diagnosis not present

## 2018-09-29 LAB — BASIC METABOLIC PANEL
Anion gap: 14 (ref 5–15)
BUN: 55 mg/dL — ABNORMAL HIGH (ref 8–23)
CALCIUM: 9.7 mg/dL (ref 8.9–10.3)
CO2: 26 mmol/L (ref 22–32)
CREATININE: 2.81 mg/dL — AB (ref 0.61–1.24)
Chloride: 104 mmol/L (ref 98–111)
GFR calc Af Amer: 26 mL/min — ABNORMAL LOW (ref >60)
GFR calc non Af Amer: 22 mL/min — ABNORMAL LOW (ref >60)
GLUCOSE: 105 mg/dL — AB (ref 70–99)
Potassium: 4.6 mmol/L (ref 3.5–5.1)
Sodium: 144 mmol/L (ref 135–145)

## 2018-09-29 LAB — CBC WITH DIFFERENTIAL/PLATELET
ABS IMMATURE GRANULOCYTES: 0.01 10*3/uL (ref 0.00–0.07)
Basophils Absolute: 0 10*3/uL (ref 0.0–0.1)
Basophils Relative: 0 %
Eosinophils Absolute: 0.1 10*3/uL (ref 0.0–0.5)
Eosinophils Relative: 2 %
HEMATOCRIT: 36.2 % — AB (ref 39.0–52.0)
HEMOGLOBIN: 10.9 g/dL — AB (ref 13.0–17.0)
Immature Granulocytes: 0 %
LYMPHS ABS: 1.3 10*3/uL (ref 0.7–4.0)
LYMPHS PCT: 21 %
MCH: 28.2 pg (ref 26.0–34.0)
MCHC: 30.1 g/dL (ref 30.0–36.0)
MCV: 93.8 fL (ref 80.0–100.0)
MONO ABS: 0.8 10*3/uL (ref 0.1–1.0)
MONOS PCT: 13 %
NEUTROS ABS: 3.9 10*3/uL (ref 1.7–7.7)
Neutrophils Relative %: 64 %
Platelets: 107 10*3/uL — ABNORMAL LOW (ref 150–400)
RBC: 3.86 MIL/uL — ABNORMAL LOW (ref 4.22–5.81)
RDW: 16.2 % — ABNORMAL HIGH (ref 11.5–15.5)
WBC: 6.2 10*3/uL (ref 4.0–10.5)
nRBC: 0 % (ref 0.0–0.2)

## 2018-09-29 LAB — PHOSPHORUS: Phosphorus: 3.2 mg/dL (ref 2.5–4.6)

## 2018-09-29 LAB — MAGNESIUM: Magnesium: 2 mg/dL (ref 1.7–2.4)

## 2018-09-29 MED FILL — PROGRAF 1 MG CAPSULE: 30 days supply | Qty: 180 | Fill #1 | Status: AC

## 2018-09-29 MED FILL — PROGRAF 1 MG CAPSULE: 30 days supply | Qty: 180 | Fill #1

## 2018-09-29 NOTE — Unmapped (Signed)
Called patient and left VM asking him to call back    Wanted to check in with patient to see how he is feeling since we increased his diuretics.  Also asked patient to get labs Monday so we can get an accurate tacrolimus level.    sent patient a Mychart message stating the same information.

## 2018-10-03 DIAGNOSIS — Z79899 Other long term (current) drug therapy: Secondary | ICD-10-CM

## 2018-10-03 DIAGNOSIS — Z940 Kidney transplant status: Principal | ICD-10-CM

## 2018-10-03 LAB — BASIC METABOLIC PANEL
BLOOD UREA NITROGEN: 55 mg/dL — ABNORMAL HIGH
CALCIUM: 9.7 mg/dL
CHLORIDE: 104 mmol/L
CREATININE: 2.81 mg/dL — ABNORMAL HIGH
EGFR MDRD AF AMER: 26 mL/min/{1.73_m2} — ABNORMAL LOW
GLUCOSE RANDOM: 105 mg/dL — ABNORMAL HIGH
POTASSIUM: 4.6 mmol/L

## 2018-10-03 LAB — CBC W/ DIFFERENTIAL
BASOPHILS ABSOLUTE COUNT: 0 10*9/L
BASOPHILS RELATIVE PERCENT: 0 %
EOSINOPHILS ABSOLUTE COUNT: 0.1 10*9/L
EOSINOPHILS RELATIVE PERCENT: 2 %
HEMATOCRIT: 36.2 % — ABNORMAL LOW
HEMOGLOBIN: 10.9 g/dL — ABNORMAL LOW
LYMPHOCYTES ABSOLUTE COUNT: 1.3 10*9/L
LYMPHOCYTES RELATIVE PERCENT: 21 %
MEAN CORPUSCULAR HEMOGLOBIN CONC: 30.1 g/dL
MEAN CORPUSCULAR HEMOGLOBIN: 28.2 pg
MEAN CORPUSCULAR VOLUME: 93.8 fL
MONOCYTES ABSOLUTE COUNT: 0.8 10*9/L
MONOCYTES RELATIVE PERCENT: 13 %
NEUTROPHILS RELATIVE PERCENT: 64 %
PLATELET COUNT: 107 10*9/L — ABNORMAL LOW
RED BLOOD CELL COUNT: 3.86 10*12/L — ABNORMAL LOW
RED CELL DISTRIBUTION WIDTH: 16.2 % — ABNORMAL HIGH

## 2018-10-03 LAB — HYPOCHROMIA: Lab: 0

## 2018-10-03 LAB — EGFR MDRD NON AF AMER: Lab: 0

## 2018-10-03 LAB — PHOSPHORUS: Lab: 3.2

## 2018-10-03 LAB — MAGNESIUM: Lab: 2

## 2018-10-04 LAB — TACROLIMUS, TROUGH: Lab: 11

## 2018-10-06 MED ORDER — ACYCLOVIR 400 MG TABLET
ORAL_TABLET | 3 refills | 0 days | Status: CP
Start: 2018-10-06 — End: ?

## 2018-10-06 NOTE — Unmapped (Signed)
Pt request for RX Refill

## 2018-10-17 ENCOUNTER — Ambulatory Visit: Admit: 2018-10-17 | Discharge: 2018-10-21 | Payer: MEDICARE

## 2018-10-17 ENCOUNTER — Encounter: Admit: 2018-10-17 | Discharge: 2018-10-21 | Payer: MEDICARE | Attending: Anesthesiology | Primary: Anesthesiology

## 2018-10-17 DIAGNOSIS — I5023 Acute on chronic systolic (congestive) heart failure: Principal | ICD-10-CM

## 2018-10-17 LAB — CBC W/ AUTO DIFF
BASOPHILS ABSOLUTE COUNT: 0.1 10*9/L (ref 0.0–0.1)
BASOPHILS RELATIVE PERCENT: 1.4 %
EOSINOPHILS ABSOLUTE COUNT: 0.2 10*9/L (ref 0.0–0.4)
EOSINOPHILS RELATIVE PERCENT: 3.7 %
HEMATOCRIT: 39.2 % — ABNORMAL LOW (ref 41.0–53.0)
HEMOGLOBIN: 12 g/dL — ABNORMAL LOW (ref 13.5–17.5)
LARGE UNSTAINED CELLS: 3 % (ref 0–4)
LYMPHOCYTES ABSOLUTE COUNT: 1.1 10*9/L — ABNORMAL LOW (ref 1.5–5.0)
LYMPHOCYTES RELATIVE PERCENT: 23.2 %
MEAN CORPUSCULAR HEMOGLOBIN CONC: 30.7 g/dL — ABNORMAL LOW (ref 31.0–37.0)
MEAN CORPUSCULAR VOLUME: 97.3 fL (ref 80.0–100.0)
MEAN PLATELET VOLUME: 8.9 fL (ref 7.0–10.0)
MONOCYTES ABSOLUTE COUNT: 0.3 10*9/L (ref 0.2–0.8)
MONOCYTES RELATIVE PERCENT: 7.3 %
PLATELET COUNT: 89 10*9/L — ABNORMAL LOW (ref 150–440)
RED BLOOD CELL COUNT: 4.03 10*12/L — ABNORMAL LOW (ref 4.50–5.90)
RED CELL DISTRIBUTION WIDTH: 16.6 % — ABNORMAL HIGH (ref 12.0–15.0)
WBC ADJUSTED: 4.5 10*9/L (ref 4.5–11.0)

## 2018-10-17 LAB — COMPREHENSIVE METABOLIC PANEL
ALBUMIN: 3.7 g/dL (ref 3.5–5.0)
ALKALINE PHOSPHATASE: 143 U/L — ABNORMAL HIGH (ref 38–126)
ALT (SGPT): 16 U/L (ref <50)
ANION GAP: 11 mmol/L (ref 7–15)
BILIRUBIN TOTAL: 1.1 mg/dL (ref 0.0–1.2)
BLOOD UREA NITROGEN: 60 mg/dL — ABNORMAL HIGH (ref 7–21)
BUN / CREAT RATIO: 20
CALCIUM: 10.5 mg/dL — ABNORMAL HIGH (ref 8.5–10.2)
CHLORIDE: 109 mmol/L — ABNORMAL HIGH (ref 98–107)
CO2: 26 mmol/L (ref 22.0–30.0)
CREATININE: 3.06 mg/dL — ABNORMAL HIGH (ref 0.70–1.30)
EGFR CKD-EPI AA MALE: 24 mL/min/{1.73_m2} — ABNORMAL LOW (ref >=60)
GLUCOSE RANDOM: 95 mg/dL (ref 65–179)
POTASSIUM: 4.5 mmol/L (ref 3.5–5.0)
PROTEIN TOTAL: 7 g/dL (ref 6.5–8.3)
SODIUM: 146 mmol/L — ABNORMAL HIGH (ref 135–145)

## 2018-10-17 LAB — CALCIUM: Calcium:MCnc:Pt:Ser/Plas:Qn:: 10.5 — ABNORMAL HIGH

## 2018-10-17 LAB — TROPONIN I
Troponin I.cardiac:MCnc:Pt:Ser/Plas:Qn:: 0.09
Troponin I.cardiac:MCnc:Pt:Ser/Plas:Qn:: <0.06

## 2018-10-17 LAB — PROTIME-INR: INR: 1.45

## 2018-10-17 LAB — HEMATOCRIT: Lab: 39.2 — ABNORMAL LOW

## 2018-10-17 LAB — SLIDE REVIEW

## 2018-10-17 LAB — HEPARIN CORRELATION: Lab: 0.2

## 2018-10-17 LAB — SMEAR REVIEW

## 2018-10-17 LAB — PROTIME: Lab: 16.8 — ABNORMAL HIGH

## 2018-10-17 LAB — PRO-BNP: Natriuretic peptide.B prohormone N-Terminal:MCnc:Pt:Ser/Plas:Qn:: 24700 — ABNORMAL HIGH

## 2018-10-17 NOTE — Unmapped (Signed)
Patient called to report he has had an increase in his fatigue over the past 2 weeks and over the weekend and wants to go to the ED.  States he feels SOB with a dry cough, and fatigue after simple tasks such as getting dressed.  Denies any fevers or increase in swelling.  Does report some faint wheezes with exertion.  Patient does not see the cardiologist until November 14th.  Patient states he is going to go to ED to get checked.  Instructed patient to call if he changes his plan.  Verbalized understanding

## 2018-10-17 NOTE — Unmapped (Signed)
Pt reports of two weeks of sob and leg weakness. Pt reports swelling in his arms and abdomen. Pt has history of congestive heart failure and kidney transplant. Pt reports increasing DOE.

## 2018-10-17 NOTE — Unmapped (Signed)
[facility]  Emergency Department Provider Note    ED Clinical Impression     Final diagnoses:   Acute kidney injury (CMS-HCC) (Primary)     Initial Impression, ED Course, Assessment and Plan     Impression: Miguel Ward is a 67 y.o. male with a past medical history of renal deceased donor transplant 06/12/07) 2/2 lupus nephritis, right renal nephrectomy (02/20/14) 2/2 renal cell carcinoma, HTN, CAD s/p PCI, hypothyroidism, diastolic and systolic HF EF 40-45% 3/19 presenting for progressively worsening anasarca and dyspnea. On exam the patient appears to be fluid up, stable respiratory status on room air, in no acute distress. Diffuse anasarca, non tender abdomen. Clinical picture is compromised renal vs heart vs liver. Plan to obtain blood work, CXR, renal ultrasound.       3:02 PM  Cr 3, baseline appears to be 2.3-2.4 however it has been noted to increasing over the last month.   CXR with mild pulmonary edema. Plan to admit to nephrology service for further management and evaluation of worsening renal function.     Additional Medical Decision Making     I have reviewed the vital signs and the nursing notes. Labs and radiology results that were available during my care of the patient were independently reviewed by me and considered in my medical decision making.     I have staffed the case with the ED Attending, Dr. Sharlette Dense.     Portions of this record have been created using Dragon dictation software. Dictation errors have been sought, but may not have been identified and corrected.  ____________________________________________       History     Chief Complaint  Shortness of Breath      HPI   Miguel Ward is a 67 y.o. male with a past medical history of renal deceased donor transplant 06-12-2007) 2/2 lupus nephritis, right renal nephrectomy (02/20/14) 2/2 renal cell carcinoma, HTN, CAD s/p PCI, hypothyroidism, diastolic and systolic HF EF 40-45% 3/19 presenting for progressively worsening anasarca and dyspnea. Patient reports progressively worsening dyspnea and fatigue over the past 2 weeks. He is additionally retaining fluid with abdominal distention. Patient reports that yesterday while walking in church he needed to stop several times to catch his breath. He feels that he is roughly 10 lbs up from his dry weight. Patient states that previously he was able to climb 3 flights of stairs but over the past 2 weeks has difficulty climbing 1 staircase. He sleeps on 3 pillows normally and needs to find different positions at night to improve his breathing while sleeping. Patient states that recently his antidiuretics were doubled. Denies chest pain, vomiting, abdominal pain, unilateral swelling. Per most recent nephrology clinic note (09/21/18) his creatinine had been noted to be slightly increased and it was thought to be 2/2 to recent illness vs. fluid shifts vs.increasing enalapril vs. elevated tacrolimus level. His heart failure was felt to be a contributing factor to his anasarca and his lasix was increased from 40 qdaily to 40 mg BID.       Past Medical History:   Diagnosis Date   ??? Anemia    ??? CHF (congestive heart failure) (CMS-HCC)    ??? Coronary artery disease    ??? Disease of thyroid gland    ??? GERD (gastroesophageal reflux disease)    ??? Gout    ??? Hyperlipidemia    ??? Hypertension    ??? Lupus nephritis (CMS-HCC)    ??? NSTEMI (non-ST elevated myocardial infarction) (  CMS-HCC)    ??? Red blood cell antibody positive     anti-K   ??? Renal cancer, right (CMS-HCC)    ??? Renal transplant, status post        Patient Active Problem List   Diagnosis   ??? Coronary atherosclerosis of native coronary artery   ??? Postsurgical percutaneous transluminal coronary angioplasty status   ??? Hypercholesterolemia   ??? Hypertension   ??? History of kidney transplant   ??? Red blood cell antibody positive   ??? H/O kidney transplant   ??? Renal cell carcinoma (CMS-HCC)   ??? Rash of back   ??? Fever   ??? Immunosuppression-related infectious disease   ??? Pneumonia due to infectious organism   ??? Elevated brain natriuretic peptide (BNP) level   ??? Bilateral lower extremity edema   ??? Elevated troponin I level   ??? Normocytic anemia   ??? Gout   ??? GERD (gastroesophageal reflux disease)   ??? RSV (respiratory syncytial virus infection)   ??? Diastolic heart failure (CMS-HCC)   ??? Systolic heart failure (CMS-HCC)   ??? NSTEMI (non-ST elevated myocardial infarction) (CMS-HCC)       Past Surgical History:   Procedure Laterality Date   ??? AV FISTULA PLACEMENT Right    ??? HERNIA REPAIR     ??? PR BREATH HYDROGEN TEST N/A 06/24/2016    Procedure: BREATH HYDROGEN TEST;  Surgeon: Nurse-Based Giproc;  [facility]: [facility];  Service: Gastroenterology   ??? PR COLONOSCOPY FLX DX W/COLLJ SPEC WHEN PFRMD N/A 05/22/2016    Procedure: COLONOSCOPY, FLEXIBLE, PROXIMAL TO SPLENIC FLEXURE; DIAGNOSTIC, W/WO COLLECTION SPECIMEN BY BRUSH OR WASH;  Surgeon: Liane Comber, MD;  Location: [facility];  Service: Gastroenterology   ??? PR NEPHRECTOMY Right 02/20/2014    Procedure: LAPAROSCOPY, SURGICAL; NEPHRECTOMY, INCLUDING PARTIAL URETERECTOMY;  Surgeon: Vivi Barrack, MD;  Location: MAIN OR [facility];  Service: Transplant   ??? TRANSPLANTATION RENAL         No current facility-administered medications for this encounter.     Current Outpatient Medications:   ???  acyclovir (ZOVIRAX) 400 MG tablet, TAKE (1) TABLET BY MOUTH EVERY DAY, Disp: 90 tablet, Rfl: 3  ???  allopurinol (ZYLOPRIM) 100 MG tablet, Take 1.5 tablets (150 mg total) by mouth daily., Disp: 135 tablet, Rfl: 3  ???  aspirin (ASPIRIN LOW-STRENGTH) 81 MG chewable tablet, Chew 81 mg daily., Disp: , Rfl:   ???  atorvastatin (LIPITOR) 20 MG tablet, Take 1 tablet (20 mg total) by mouth daily., Disp: 90 tablet, Rfl: 3  ???  calcitriol (ROCALTROL) 0.25 MCG capsule, Take 1 capsule (0.25 mcg total) by mouth Every Monday, Wednesday, and Friday., Disp: 36 capsule, Rfl: 3  ???  CELLCEPT 250 mg capsule, TAKE 1 CAPSULE BY MOUTH TWICE DAILY, Disp: 180 each, Rfl: 3  ???  colchicine (COLCRYS) 0.6 mg tablet, Take 1 tablet (0.6 mg total) by mouth daily as needed. for up to 1 dose, Disp: 30 tablet, Rfl: 11  ???  enalapril (VASOTEC) 10 MG tablet, Take 1 tablet (10 mg total) by mouth Two (2) times a day., Disp: 180 tablet, Rfl: 3  ???  ferrous sulfate 325 (65 FE) MG tablet, Take 325 mg by mouth 3 (three) times a day., Disp: , Rfl:   ???  fluticasone (FLONASE) 50 mcg/actuation nasal spray, 1 spray into each nostril daily as needed., Disp: , Rfl:   ???  furosemide (LASIX) 40 MG tablet, Take 1 tablet (40 mg total) by mouth daily., Disp: 90  tablet, Rfl: 3  ???  hydrocortisone 2.5 % ointment, Only when needed in front of ear; can used more broadly on lower legs if needed for itching., Disp: 453 g, Rfl: 1  ???  levothyroxine (SYNTHROID) 200 MCG tablet, Take 1 tab with two tab, total dose daily, Disp: 90 tablet, Rfl: 3  ???  levothyroxine (SYNTHROID, LEVOTHROID) 25 MCG tablet, Take 2 tabs with one tab, total dose daily, Disp: 90 tablet, Rfl: 3  ???  magnesium oxide (MAG-OX) 400 mg (241.3 mg magnesium) tablet, Take 1 tablet (400 mg total) by mouth Two (2) times a day., Disp: 180 tablet, Rfl: 3  ???  metoprolol succinate (TOPROL-XL) 100 MG 24 hr tablet, Take 1 tablet (100 mg total) by mouth daily., Disp: 90 tablet, Rfl: 3  ???  omeprazole (PRILOSEC) 20 MG capsule, Take 1 capsule (20 mg total) by mouth daily., Disp: 90 capsule, Rfl: 3  ???  predniSONE (DELTASONE) 5 MG tablet, Take 1 tablet (5 mg total) by mouth daily., Disp: 90 tablet, Rfl: 3  ???  PROGRAF 1 mg capsule, TAKE 3 CAPSULES (3MG ) BY MOUTH TWICE DAILY, Disp: 180 capsule, Rfl: 11  ???  simethicone (GAS-X) 80 MG chewable tablet, Chew 1 tablet (80 mg total) every six (6) hours as needed for flatulence., Disp: 30 tablet, Rfl: 1  ???  sodium bicarbonate 650 mg tablet, Take 1 tablet (650 mg total) by mouth Two (2) times a day., Disp: 180 tablet, Rfl: 3  ???  triamcinolone (KENALOG) 0.1 % ointment, Apply topically Two (2) times a day. For only 2 week BID topically from itchy area in front of ears., Disp: 80 g, Rfl: 1    Allergies  Penicillins    Family History   Problem Relation Age of Onset   ??? Heart disease Mother    ??? Diabetes Mother    ??? Hypertension Mother    ??? Cancer Father    ??? Alcohol abuse Brother    ??? Cardiomyopathy Neg Hx    ??? Sudden Cardiac Death Neg Hx    ??? Melanoma Neg Hx    ??? Basal cell carcinoma Neg Hx    ??? Squamous cell carcinoma Neg Hx        Social History  Social History     Tobacco Use   ??? Smoking status: Never Smoker   ??? Smokeless tobacco: Never Used   Substance Use Topics   ??? Alcohol use: No     Alcohol/week: 0.0 standard drinks   ??? Drug use: No       Review of Systems  Constitutional: Negative for fever.  Eyes: Negative for visual changes.  ENT: Negative for sore throat.  CV: Negative for chest pain.  Resp: Positive for shortness of breath.  GI: Negative for abdominal pain.  GU: Negative for dysuria.  MSK: Negative for back pain.  Derm: Negative for rash.  Neuro: Negative for headaches.    Physical Exam     ED Triage Vitals [10/17/18 1252]   Enc Vitals Group      BP 164/93      Heart Rate 63      SpO2 Pulse       Resp 14      Temp       Temp Source Oral      SpO2 99 %     Constitutional: Appears stated age.  HEENT: Normocephalic. Conjunctivae clear. No congestion. Moist mucous membranes.   Heme/Lymph/Immuno: No petechiae or bruising  CV:  Regular rate and rhythm. Warm well perfused.   Resp: Clear to auscultation bilaterally.   GI: Soft, non tender, but distended with ascites.    GU: No suprapubic tenderness  MSK: 2+ pitting edema to the BLE. Normal range of motion in all extremities.  Neuro: Normal speech. No gross focal neurologic deficits appreciated.  Skin: Warm, dry, intact.  Psychiatric: Mood and affect are normal.     EKG     Normal sinus rhythm, no ST elevations,     Radiology     XR Chest 2 views   Final Result      Mild pulmonary edema and small bilateral pleural effusions.      Patchy right lower lung opacities, likely combination of atelectasis and infection/aspiration,        _________________________________  Documentation assistance was provided by Devin Going, Scribe, on October 17, 2018 at 1:38 PM for Lindwood Coke, MD.    Documentation assistance was provided by the scribe in my presence.  The documentation recorded by the scribe has been reviewed by me and accurately reflects the services I personally performed.                Loma Sousa, MD  Resident  10/17/18 [phone]

## 2018-10-18 DIAGNOSIS — I5023 Acute on chronic systolic (congestive) heart failure: Principal | ICD-10-CM

## 2018-10-18 LAB — RENAL FUNCTION PANEL
ALBUMIN: 3.4 g/dL — ABNORMAL LOW (ref 3.5–5.0)
BLOOD UREA NITROGEN: 55 mg/dL — ABNORMAL HIGH (ref 7–21)
BUN / CREAT RATIO: 19
CALCIUM: 10.1 mg/dL (ref 8.5–10.2)
CHLORIDE: 103 mmol/L (ref 98–107)
CO2: 28 mmol/L (ref 22.0–30.0)
CREATININE: 2.86 mg/dL — ABNORMAL HIGH (ref 0.70–1.30)
EGFR CKD-EPI AA MALE: 26 mL/min/{1.73_m2} — ABNORMAL LOW (ref >=60)
GLUCOSE RANDOM: 99 mg/dL (ref 65–179)
PHOSPHORUS: 3.4 mg/dL (ref 2.9–4.7)
POTASSIUM: 4.1 mmol/L (ref 3.5–5.0)
SODIUM: 141 mmol/L (ref 135–145)

## 2018-10-18 LAB — CBC
HEMATOCRIT: 36.8 % — ABNORMAL LOW (ref 41.0–53.0)
HEMOGLOBIN: 11.2 g/dL — ABNORMAL LOW (ref 13.5–17.5)
MEAN CORPUSCULAR HEMOGLOBIN: 29.1 pg (ref 26.0–34.0)
MEAN CORPUSCULAR VOLUME: 96.1 fL (ref 80.0–100.0)
MEAN PLATELET VOLUME: 9.8 fL (ref 7.0–10.0)
PLATELET COUNT: 103 10*9/L — ABNORMAL LOW (ref 150–440)
RED BLOOD CELL COUNT: 3.83 10*12/L — ABNORMAL LOW (ref 4.50–5.90)
RED CELL DISTRIBUTION WIDTH: 16 % — ABNORMAL HIGH (ref 12.0–15.0)

## 2018-10-18 LAB — TACROLIMUS BLOOD: Lab: 9.3

## 2018-10-18 LAB — TACROLIMUS, TROUGH: Lab: 18.2 — ABNORMAL HIGH

## 2018-10-18 LAB — CO2: Carbon dioxide:SCnc:Pt:Ser/Plas:Qn:: 28

## 2018-10-18 LAB — TROPONIN I
Troponin I.cardiac:MCnc:Pt:Ser/Plas:Qn:: 0.084
Troponin I.cardiac:MCnc:Pt:Ser/Plas:Qn:: 0.098

## 2018-10-18 LAB — THYROID STIMULATING HORMONE: Thyrotropin:ACnc:Pt:Ser/Plas:Qn:: 7.93 — ABNORMAL HIGH

## 2018-10-18 LAB — HEMATOCRIT: Lab: 36.8 — ABNORMAL LOW

## 2018-10-18 LAB — FREE T4: Thyroxine.free:MCnc:Pt:Ser/Plas:Qn:: 2.88 — ABNORMAL HIGH

## 2018-10-18 NOTE — Unmapped (Signed)
Pt denies any SOB.VSS. Denies any CP. I&O monitored.falls precautions in place.      Problem: Adult Inpatient Plan of Care  Goal: Plan of Care Review  Outcome: Ongoing - Unchanged  Goal: Patient-Specific Goal (Individualization)  Outcome: Ongoing - Unchanged  Goal: Absence of Hospital-Acquired Illness or Injury  Outcome: Ongoing - Unchanged  Goal: Optimal Comfort and Wellbeing  Outcome: Ongoing - Unchanged  Goal: Readiness for Transition of Care  Outcome: Ongoing - Unchanged  Goal: Rounds/Family Conference  Outcome: Ongoing - Unchanged     Problem: Adjustment to Illness (Heart Failure)  Goal: Optimal Coping  Outcome: Ongoing - Unchanged     Problem: Cardiac Output Decreased (Heart Failure)  Goal: Optimal Cardiac Output  Outcome: Ongoing - Unchanged     Problem: Fluid Imbalance (Heart Failure)  Goal: Fluid Balance  Outcome: Ongoing - Unchanged     Problem: Functional Ability Impaired (Heart Failure)  Goal: Optimal Functional Ability  Outcome: Ongoing - Unchanged     Problem: Respiratory Compromise (Heart Failure)  Goal: Effective Oxygenation and Ventilation  Outcome: Ongoing - Unchanged     Problem: Fall Injury Risk  Goal: Absence of Fall and Fall-Related Injury  Outcome: Ongoing - Unchanged

## 2018-10-18 NOTE — Unmapped (Signed)
PHYSICAL THERAPY  Evaluation (10/18/18 1125)     Patient Name:  Miguel Ward       Medical Record Number: [id number]   Date of Birth: April 02, 1951  Sex: Male            Treatment Diagnosis: decreased balance strategies, decreased endurance from baseline, h/o fall (w/in last 6 mos)    ASSESSMENT    Miguel Ward is a 67 y.o. male w/ HX of ESRD s/p DDKT on 06/11/2007 2/2 lupus nephritis, right renal nephrectomy (02/20/2014) in the setting of renal cell carcinoma, CAD, HTN, prior pericardial effusion, hypothyroidism and gout who presents with dyspnea and anasarca c/f HF exacerbation.  He presents to PT independent with transfers and ambulation, however, demonstrates high level balance deficits, h/o fall in community based setting as well as residual R shoulder dysfunction for which he will benefit from PT in a community based setting to address.  Based on the AM-PAC 6 item raw score of 23/24, the patient is considered to be 16.55% impaired with basic mobility.  He has no acute skilled PT needs at this time, recommend daily ambulation with family / RN staff while inhouse to prevent deconditioning while in the acute setting. After review of contributing co-morbidities and personal factors, clinical presentation and exam findings, patient demonstrates low complexity for evaluation and development of plan of care.      Today's Interventions: Eval, AMPAC 23/24, dynamic balance activities, ambualtion, education re: fall prevention, community based PT services re: balance strategies, R shoulder rehab, all questions answered    Activity Tolerance: Patient tolerated treatment well    PLAN  Planned Frequency of Treatment:  D/C Services for: D/C Services      Planned Interventions:  none    Post-Discharge Physical Therapy Recommendations:  3x weekly;Tourist information centre manager        PT DME Recommendations: None     Goals:   Patient and Family Goals: return to community activities, home chores          Prognosis:  Good  Barriers to Discharge: None  Positive Indicators: age, motivation, PLOF, family support    SUBJECTIVE  Patient reports: Patient consents to PT  Current Functional Status: Patient received/session completion with patient in bed, needs in reach     Prior functional status: Patient reports independent prior to admission, 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  He reports fall in July where he landed on his R shoulder, limited ROM since then,  Reports he usually is able to use backpack blower for leaves in the fall but this year will not 2/2 shoulder  Equipment available at home: None    Past Medical History:   Diagnosis Date   ??? Anemia    ??? CHF (congestive heart failure) (CMS-HCC)    ??? Coronary artery disease    ??? Disease of thyroid gland    ??? GERD (gastroesophageal reflux disease)    ??? Gout    ??? Hyperlipidemia    ??? Hypertension    ??? Lupus nephritis (CMS-HCC)    ??? NSTEMI (non-ST elevated myocardial infarction) (CMS-HCC)    ??? Red blood cell antibody positive     anti-K   ??? Renal cancer, right (CMS-HCC)    ??? Renal transplant, status post     Social History     Tobacco Use   ??? Smoking status: Never Smoker   ??? Smokeless tobacco: Never Used   Substance Use Topics   ??? Alcohol use: No     Alcohol/week: 0.0  standard drinks      Past Surgical History:   Procedure Laterality Date   ??? AV FISTULA PLACEMENT Right    ??? HERNIA REPAIR     ??? PR BREATH HYDROGEN TEST N/A 06/24/2016    Procedure: BREATH HYDROGEN TEST;  Surgeon: Nurse-Based Giproc;  [facility]: [facility];  Service: Gastroenterology   ??? PR COLONOSCOPY FLX DX W/COLLJ SPEC WHEN PFRMD N/A 05/22/2016    Procedure: COLONOSCOPY, FLEXIBLE, PROXIMAL TO SPLENIC FLEXURE; DIAGNOSTIC, W/WO COLLECTION SPECIMEN BY BRUSH OR WASH;  Surgeon: Liane Comber, MD;  Location: [facility];  Service: Gastroenterology   ??? PR NEPHRECTOMY Right 02/20/2014    Procedure: LAPAROSCOPY, SURGICAL; NEPHRECTOMY, INCLUDING PARTIAL URETERECTOMY;  Surgeon: Vivi Barrack, MD;  Location: MAIN OR [facility];  Service: Transplant   ??? TRANSPLANTATION RENAL      Family History   Problem Relation Age of Onset   ??? Heart disease Mother    ??? Diabetes Mother    ??? Hypertension Mother    ??? Cancer Father    ??? Alcohol abuse Brother    ??? Cardiomyopathy Neg Hx    ??? Sudden Cardiac Death Neg Hx    ??? Melanoma Neg Hx    ??? Basal cell carcinoma Neg Hx    ??? Squamous cell carcinoma Neg Hx         Allergies: Penicillins        Objective Findings              Precautions: Non-applicable              Weight Bearing Status: Non- applicable              Required Braces or Orthoses: Non- applicable    Communication Preference: Verbal  Pain Comments: denies pain  Medical Tests / Procedures: review of imaging, orders, labs, consults  Equipment / Environment: Telemetry(R UE fistula)    At Rest: HR 88 bpm  With Activity: HR 92 bpm  Orthostatics: asymptomatic       Living environment: House  Lives With: Spouse  Home Living: One level home;Stairs to enter with rails;Tub/shower unit;Hand-held shower hose  Rail placement (outside): Rail on right side     Number of Stairs: 8    Cognition: A&Ox4     Skin Inspection: edematous R UE, elevated fistula, RN / MD aware    UE ROM: R shoulder flexion ~ 40 degrees actively, 90 degrees active abduction, L UE WFL - B hands with gouty nodules, joint malalignments at fingers  UE Strength: WFL  x R shoulder (see ROM above)  LE ROM: WFL B  LE Strength: WFL B               Balance: sitting: independent.  Standing: static: FTEC: supervision; Tandem stance: unilateral UE support to transition and maintain, close supervision;  SLS: contact guard x 5 seconds         Bed Mobility: independent  Transfers: sit<.stand independent   Gait: patient able to ambulate x 400 feet independently, no device, steady cadence, reciprocal gait pattern, able to stop/ start/ vary cadence and navigate env. independently, no LOB noted   Stairs: With unilateral UE support, Patient able to perform SLS/ SLSquat 2 inches independently - reports utilization of B rails for support and has assist available at home PRN      Eval Duration(PT): 23 Min.    Medical Staff Made Aware: RN     I attest that I have reviewed the  above information.  Signed: Madlyn Frankel Lui Bellis, PT  Filed 16/12/958

## 2018-10-18 NOTE — Unmapped (Signed)
Tacrolimus Therapeutic Monitoring Pharmacy Note    Antonia Culbertson is a 67 y.o. male continuing tacrolimus.     Indication: Kidney transplant     Date of Transplant: 06/11/2007      Prior Dosing Information: Home regimen 3 mg BID      Goals:  Therapeutic Drug Levels  Tacrolimus trough goal: 6-8 ng/mL (per nephrology note 09/21/18)    Additional Clinical Monitoring/Outcomes  ?? Monitor renal function (SCr and urine output) and liver function (LFTs)  ?? Monitor for signs/symptoms of adverse events (e.g., hyperglycemia, hyperkalemia, hypomagnesemia, hypertension, headache, tremor)    Results:   Tacrolimus level: Not applicable    Pharmacokinetic Considerations and Significant Drug Interactions:  ? Concurrent hepatotoxic medications: None identified  ? Concurrent CYP3A4 substrates/inhibitors: None identified  ? Concurrent nephrotoxic medications: Lasix, acyclovir    Assessment/Plan:  Recommendedation(s)  ? Continue current regimen of 3 mg BID. Of note, patient reported has skipped AM dose on 11/4    Follow-up  ? Next level has been ordered on 11/5 at 0700.   ? A pharmacist will continue to monitor and recommend levels as appropriate    Please page service pharmacist with questions/clarifications.    Laretta Alstrom, PharmD

## 2018-10-18 NOTE — Unmapped (Signed)
Medicine History and Physical    Assessment/Plan:    Principal Problem:    Acute on chronic HFrEF (heart failure with reduced ejection fraction) (CMS-HCC)  Active Problems:    Hypercholesterolemia    Hypertension    History of kidney transplant    Renal cell carcinoma (CMS-HCC)    Normocytic anemia    Gout    GERD (gastroesophageal reflux disease)  Resolved Problems:    * No resolved hospital problems. *      Miguel Ward is a 67 y.o. male??w/ HX of ESRD s/p DDKT on 06/11/2007 2/2 lupus nephritis, right renal nephrectomy (02/20/2014) in the setting of renal cell carcinoma, CAD, HTN, prior pericardial effusion, hypothyroidism and gout who presents with dyspnea and anasarca c/f HF exacerbation.    Acute on Chronic HFrEF w/ Anasarca: Pt with known HF w/ EF of 40-45% previously. As noted in HPI, patient reports dyspnea, orthopnea and worsening edema. He also notes a 10lb weight gain and his weight of 85.8kg is notably above more recent dry weight of 80kg. Pro-BNP elevated at > 24,000. Trop negative. EKG unchanged from prior. Recently increased from 40mg  BID to 80mg  BID PO lasix. Reports that it helped some but not enough.   - start IV lasix 80mg  trial x1, likely would benefit from BID dosing and may need up to IV lasix 160mg  per dose  - Continue metoprolol succinate 100 mg QD  - trend troponing q6 hrs, repeat ECG if trop elevated or chest pain  - TTE  - telemetry x 48 hrs  - cont ASA 81mg  daily  - Strict I/Os  - daily weights  - PT/OT  - Na restricted diet    Acute on Chronic Renal Failure in patient w/ History of kidney transplant: Follows with Dr. Posey Boyer True; deceased donor??kidney transplant on 06/11/2007 secondary to lupus nephritis, right renal nephrectomy (02/20/2014) in the setting of renal cell carcinoma; patient reports that he has his transplanted kidney in his RLQ and a left kidney. Cr at admission of 3.06, which is above recent Cr levels of 2.3-2.8. Suspect that Cr bump is 2/2 to volume overload and will diurese as above. Transplant renal US only notable for ascites. Only minimal proteinuria on UA.   - IV diuresis as above  - hold enalapril for now  - Continue PO calcitriol 0.25 mcg M/W/F  - Continue mycophenolate 250 mg BID  - Continue tacrolimus 3 mg Q12H  - Continue prednisone 5mg  daily  - Continue sodium bicarbonate 650 mg BID  - Continue acyclovir ppx  - Daily BMP    CAD s/p stent (2010)/HTN:   - Given his AKI, will hold enalapril 5mg  BID for now  - cont metoprolol as above  ??  HLD:  - PO atorvastatin  ??  Normocytic anemia:  - Continue PO ferrous sulfate 325 mg   - Daily CBC  ??  Hypothyroidism:  - continue levothyroxine 250 mcg QD  ??  GERD:  - PPI  ??  Gout:  - PO allopurinol    Code, DNR/DNI, wife is surrogate, see ACP Note    ___________________________________________________________________    Chief Complaint:  Chief Complaint   Patient presents with   ??? Shortness of Breath     Acute on chronic HFrEF (heart failure with reduced ejection fraction) (CMS-HCC)    HPI:  Miguel Ward is a 67 y.o. male??w/ HX of ESRD s/p DDKT on 06/11/2007 2/2 lupus nephritis, right renal nephrectomy (02/20/2014) in the setting of renal  cell carcinoma, CAD, HTN, prior pericardial effusion, hypothyroidism and gout who presents with dyspnea and anasarca c/f HF exacerbation.    Per patient, for approximately 2 weeks, he has been having progressive shortness of breath is worse with exertion.  Is associated fatigue, abdominal distention, lower extremity edema, and a 10 pound increase in his weight (dry weight of 175-178lbs) despite increasing his home Lasix to 80 mg p.o. twice daily.  He notes that he is short of breath and feels weak with simple tasks like tying issues putting on his clothes in the morning. He continues to have orthopnea (sleeps on 3 pillows at baseline). No PND but reports difficultly finding a comfortable position at night for his breathing.     No recent sick contacts or dietary indiscretions.    On ROS, patient also notes some diarrhea.  No fever, chills, night sweats, sore throat, congestion, chest pain, palpitations, nausea, vomiting, dysuria, constipation, joint pain, myalgias, or new skin findings.      Allergies:  Penicillins    Medications:   Prior to Admission medications    Medication Dose, Route, Frequency   acyclovir (ZOVIRAX) 400 MG tablet TAKE (1) TABLET BY MOUTH EVERY DAY   allopurinol (ZYLOPRIM) 100 MG tablet 150 mg, Oral, Daily (standard)   aspirin (ASPIRIN LOW-STRENGTH) 81 MG chewable tablet 81 mg, Daily (standard)   atorvastatin (LIPITOR) 20 MG tablet 20 mg, Oral, Daily (standard)   calcitriol (ROCALTROL) 0.25 MCG capsule 0.25 mcg, Oral, Every Mon-Wed-Fri   CELLCEPT 250 mg capsule 250 mg, Oral   colchicine (COLCRYS) 0.6 mg tablet 0.6 mg, Oral, Daily PRN   enalapril (VASOTEC) 10 MG tablet 10 mg, Oral, 2 times a day (standard)   ferrous sulfate 325 (65 FE) MG tablet 325 mg, 3 times daily (RT)   fluticasone (FLONASE) 50 mcg/actuation nasal spray 1 spray, Nasal, Daily PRN   furosemide (LASIX) 40 MG tablet 40 mg, Oral, Daily (standard)   hydrocortisone 2.5 % ointment Only when needed in front of ear; can used more broadly on lower legs if needed for itching.   levothyroxine (SYNTHROID) 200 MCG tablet Take 1 tab with two tab, total dose daily   levothyroxine (SYNTHROID, LEVOTHROID) 25 MCG tablet Take 2 tabs with one tab, total dose daily   magnesium oxide (MAG-OX) 400 mg (241.3 mg magnesium) tablet 400 mg, Oral, 2 times a day (standard)   metoprolol succinate (TOPROL-XL) 100 MG 24 hr tablet 100 mg, Oral, Daily (standard)   omeprazole (PRILOSEC) 20 MG capsule 20 mg, Oral, Daily (standard)   predniSONE (DELTASONE) 5 MG tablet 5 mg, Oral, Daily (standard)   PROGRAF 1 mg capsule TAKE 3 CAPSULES (3MG ) BY MOUTH TWICE DAILY   simethicone (GAS-X) 80 MG chewable tablet 80 mg, Oral, Every 6 hours PRN   sodium bicarbonate 650 mg tablet 650 mg, Oral, 2 times a day (standard)   triamcinolone (KENALOG) 0.1 % ointment Topical, 2 times a day (standard), For only 2 week BID topically from itchy area in front of ears.       Medical History:  Past Medical History:   Diagnosis Date   ??? Anemia    ??? CHF (congestive heart failure) (CMS-HCC)    ??? Coronary artery disease    ??? Disease of thyroid gland    ??? GERD (gastroesophageal reflux disease)    ??? Gout    ??? Hyperlipidemia    ??? Hypertension    ??? Lupus nephritis (CMS-HCC)    ??? NSTEMI (non-ST elevated  myocardial infarction) (CMS-HCC)    ??? Red blood cell antibody positive     anti-K   ??? Renal cancer, right (CMS-HCC)    ??? Renal transplant, status post        Surgical History:  Past Surgical History:   Procedure Laterality Date   ??? AV FISTULA PLACEMENT Right    ??? HERNIA REPAIR     ??? PR BREATH HYDROGEN TEST N/A 06/24/2016    Procedure: BREATH HYDROGEN TEST;  Surgeon: Nurse-Based Giproc;  [facility];  Service: Gastroenterology   ??? PR COLONOSCOPY FLX DX W/COLLJ SPEC WHEN PFRMD N/A 05/22/2016    Procedure: COLONOSCOPY, FLEXIBLE, PROXIMAL TO SPLENIC FLEXURE; DIAGNOSTIC, W/WO COLLECTION SPECIMEN BY BRUSH OR WASH;  Surgeon: Liane Comber, MD;  Location: [facility];  Service: Gastroenterology   ??? PR NEPHRECTOMY Right 02/20/2014    Procedure: LAPAROSCOPY, SURGICAL; NEPHRECTOMY, INCLUDING PARTIAL URETERECTOMY;  Surgeon: Vivi Barrack, MD;  Location: MAIN OR [facility];  Service: Transplant   ??? TRANSPLANTATION RENAL         Social History:  Social History     Socioeconomic History   ??? Marital status: Married     Spouse name: Not on file   ??? Number of children: Not on file   ??? Years of education: Not on file   ??? Highest education level: Not on file   Occupational History   ??? Not on file   Social Needs   ??? Financial resource strain: Not on file   ??? Food insecurity:     Worry: Not on file     Inability: Not on file   ??? Transportation needs:     Medical: Not on file     Non-medical: Not on file   Tobacco Use   ??? Smoking status: Never Smoker   ??? Smokeless tobacco: Never Used   Substance and Sexual Activity   ??? Alcohol use: No     Alcohol/week: 0.0 standard drinks   ??? Drug use: No   ??? Sexual activity: Not Currently   Lifestyle   ??? Physical activity:     Days per week: Not on file     Minutes per session: Not on file   ??? Stress: Not on file   Relationships   ??? Social connections:     Talks on phone: Not on file     Gets together: Not on file     Attends religious service: Not on file     Active member of club or organization: Not on file     Attends meetings of clubs or organizations: Not on file     Relationship status: Not on file   Other Topics Concern   ??? Do you use sunscreen? No   ??? Tanning bed use? No   ??? Are you easily burned? No   ??? Excessive sun exposure? No   ??? Blistering sunburns? No   Social History Narrative    02/11/18: The patient lives with his wife, his daughter, and his two grandchildren in [place], [place]. 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 No history of Financial planner. No smoking history. No reported alcohol history.        Family History:  Family History   Problem Relation Age of Onset   ??? Heart disease Mother    ??? Diabetes Mother    ??? Hypertension Mother    ???  Cancer Father    ??? Alcohol abuse Brother    ??? Cardiomyopathy Neg Hx    ??? Sudden Cardiac Death Neg Hx    ??? Melanoma Neg Hx    ??? Basal cell carcinoma Neg Hx    ??? Squamous cell carcinoma Neg Hx        Review of Systems:  10 systems reviewed and are negative unless otherwise mentioned in HPI    Labs/Studies:  Labs and Studies from the last 24hrs per EMR and Reviewed    Physical Exam:  Temp:  [36.7 ??C] 36.7 ??C  Heart Rate:  [63-72] 72  Resp:  [14-19] 18  BP: (164-191)/(90-94) 179/91  SpO2:  [98 %-100 %] 100 %, BP 179/91  - Pulse 72  - Temp 36.7 ??C  - Resp 18  - SpO2 100% , No intake or output data in the 24 hours ending 10/17/18 2120    GEN: NAD, lying in bed  EYES: EOMI  ENT: MMM, oropharynx w/o erythema or exudate, some facial swelling  CV: RRR with frequent PVCs, no murmurs appreciated  PULM: Bibasilar crackles, NWOB on RA at rest  ABD: soft, distended with pitting edema, slightly tender diffusely, +BS  EXT: 2+ pitting edema in sacral area and b/l thighs, 1+ in LEs,  Multiple joint abnormalities on b/l hands 2/2 gout - all non tender and non swollen  NEURO: No focal deficits, no cranial nerve deficits, 5 out of 5 strength throughout, light touch sensation intact and symmetric, gait deferred  PSYCH: A+Ox3, appropriate  GU: No CVA tenderness  MSK: No spinal tenderness

## 2018-10-18 NOTE — Unmapped (Signed)
Care Management  Initial Transition Planning Assessment              General  Care Manager assessed the patient by : In person interview with patient, Medical record review, Discussion with Clinical Care team  Orientation Level: Oriented X4  Who provides care at home?: N/A     CM met w/ pt to complete assessment.  He is A&O x 4, drives, is independent at baseline, no in home services.  He lives w/ his wife, Natalia Leatherwood.  His daughter, Lorene Dy, is also living in the home right now.  They have another daughter, Clarisse Gouge, as well.  Pt denies need for any d/c services and none identified during assessment.    Contact/Decision Maker      [phone] (mobile)   [phone] (home)       Extended Emergency Contact Information  Primary Emergency Contact: Jeanty,Katherine   [place]  Home Phone: [phone]  Mobile Phone: [phone]  Relation: Spouse  Secondary Emergency Contact: Marjo Bicker  Mobile Phone: [phone]  Relation: Daughter    Patient Information  Lives with: Spouse/significant other, Children    Type of Residence: Private residence  Location/Detail:   [address] [place] [address]    Support Systems: Family Members, Friends/Neighbors, Spouse, Church/Faith Community    Responsibilities/Dependents at home?: No    Home Care services in place prior to admission?: No    Outpatient/Community Resources in place prior to admission: Clinic  Agency detail (Name/Phone #):   Followed by Dr Carlene Coria and Philis Nettle in transplant clinic    Equipment Currently Used at Home: none       Currently receiving outpatient dialysis?: No       Financial Information       Need for financial assistance?: No       Social Determinants of Health  Social Determinants of Health were addressed in provider documentation.  Please refer to patient history.    Discharge Needs Assessment  Concerns to be Addressed: no discharge needs identified, denies needs/concerns at this time    Clinical Risk Factors: > 65, Other (Comment), Multiple Diagnoses (Chronic)(transplant patient)    Barriers to taking medications: No   Primary Insurance- Payor: Advertising copywriter MEDICARE ADV / Plan: Advertising copywriter MEDICARE ADV / Product Type: *No Product type* /   Secondary Insurance ??? None  Prescription Coverage ??? [facility]  Preferred Pharmacy - [facility] - [place], [place] - [address]  [facility]  [facility]    Prior overnight hospital stay or ED visit in last 90 days: No    Readmission Within the Last 30 Days: no previous admission in last 30 days    Anticipated Changes Related to Illness: none    Equipment Needed After Discharge: none    Discharge Facility/Level of Care Needs: other (see comments)(home w/ self care)    Readmission  Risk of Unplanned Readmission Score: UNPLANNED READMISSION SCORE: 23%  Readmitted Within the Last 30 Days?   Patient at risk for readmission?: Yes    Discharge Plan  Screen findings are: Care Manager reviewed the plan of the patient's care with the Multidisciplinary Team. No discharge planning needs identified at this time. Care Manager will continue to manage plan and monitor patient's progress with the team.    Patient and/or family were provided with choice of facilities / services that are available and appropriate to meet post hospital care needs?: N/A  Initial Assessment complete?: Yes

## 2018-10-18 NOTE — Unmapped (Signed)
ADVANCED CARE PLANNING NOTE    Discussion Date:  October 17, 2018    Patient has decisional Capacity:  Yes    Living Will:  No    Surrogate Decision Maker:  Yes, his wife    Discussion Participants:  Patient, Miguel Ward  Miguel Grayer, MD    Communication of Medical Status/Prognosis:   We discussed the patient is being admitted for a heart failure exacerbation and that he will be treated with IV diuresis.    Communication of Treatment Goals/Options:   As above, patient will be treated with IV diuresis, with an expectation that he would improve to his previous dry weight.     Goals of care and CODE STATUS was brought up as part of routine admission.  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  For himself, he is determined that he would never want resuscitation attempted on him and he would prefer to allow a natural death.    We discussed that that is not an expectation for this admission, but we want to honor his wishes and values at all times.    Treatment Decisions:   DNR/DNI    I discussed advanced care planning with patient for which they wanted to participate in this voluntary service.  I spent a total of 15 minutes providing advanced care planning services for this patient.

## 2018-10-18 NOTE — Unmapped (Signed)
OCCUPATIONAL THERAPY  Evaluation (10/18/18 1056)    Patient Name:  Miguel Ward       Medical Record Number: [id number]   Date of Birth: 06-19-1951  Sex: Male            Assessment  Miguel Ward is a 67 y.o. male w/ HX of ESRD s/p DDKT on 06/11/2007 2/2 lupus nephritis, right renal nephrectomy (02/20/2014) in the setting of renal cell carcinoma, CAD, HTN, prior pericardial effusion, hypothyroidism and gout who presents with dyspnea and anasarca c/f HF exacerbation. Miguel Ward presents to acute OT with decreased activity tolerance however is performing at or near functional baseline. Based on the [facility] daily activity score of 24/24, pt is considered to be 0% impaired with self-care. This indicates that acute OT is not recommended and there is no need for post-acute f/u. Based on PMH, current deficits and clinical judgement, this is considered to be a LOW complexity eval.      Today's Interventions: ADL retraining;Education - Patient;Safety education;Functional mobility;Transfer training    Activity Tolerance During Today's Session  Patient tolerated treatment well    Plan  Planned Frequency of Treatment:  D/C Services for: D/C Services     Post-Discharge Occupational Therapy Recommendations:  OT Post Acute Discharge Recommendations: OT services not indicated    ;    OT DME Recommendations: Tub Transfer Bench    Subjective  Current Status Pt received and left seated at EOB, all immediate needs within reach and RN aware  Prior Functional Status Pt reports independence with ADLs and functional mobility with out AD. 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 Anemia    ??? CHF (congestive heart failure) (CMS-HCC)    ??? Coronary artery disease    ??? Disease of thyroid gland    ??? GERD (gastroesophageal reflux disease)    ??? Gout    ??? Hyperlipidemia    ??? Hypertension    ??? Lupus nephritis (CMS-HCC)    ??? NSTEMI (non-ST elevated myocardial infarction) (CMS-HCC)    ??? Red blood cell antibody positive     anti-K   ??? Renal cancer, right (CMS-HCC)    ??? Renal transplant, status post     Social History     Tobacco Use   ??? Smoking status: Never Smoker   ??? Smokeless tobacco: Never Used   Substance Use Topics   ??? Alcohol use: No     Alcohol/week: 0.0 standard drinks      Past Surgical History:   Procedure Laterality Date   ??? AV FISTULA PLACEMENT Right    ??? HERNIA REPAIR     ??? PR BREATH HYDROGEN TEST N/A 06/24/2016    Procedure: BREATH HYDROGEN TEST;  Surgeon: Nurse-Based Giproc;  [facility];  Service: Gastroenterology   ??? PR COLONOSCOPY FLX DX W/COLLJ SPEC WHEN PFRMD N/A 05/22/2016    Procedure: COLONOSCOPY, FLEXIBLE, PROXIMAL TO SPLENIC FLEXURE; DIAGNOSTIC, W/WO COLLECTION SPECIMEN BY BRUSH OR WASH;  Surgeon: Liane Comber, MD;  [facility]: [facility];  Service: Gastroenterology   ??? PR NEPHRECTOMY Right 02/20/2014    Procedure: LAPAROSCOPY, SURGICAL; NEPHRECTOMY, INCLUDING PARTIAL URETERECTOMY;  Surgeon: Vivi Barrack, MD;  Location: MAIN OR [facility];  Service: Transplant   ??? TRANSPLANTATION RENAL  Family History   Problem Relation Age of Onset   ??? Heart disease Mother    ??? Diabetes Mother    ??? Hypertension Mother    ??? Cancer Father    ??? Alcohol abuse Brother    ??? Cardiomyopathy Neg Hx    ??? Sudden Cardiac Death Neg Hx    ??? Melanoma Neg Hx    ??? Basal cell carcinoma Neg Hx    ??? Squamous cell carcinoma Neg Hx         Penicillins     Objective Findings  Precautions / Restrictions  Falls precautions    Weight Bearing  Non-applicable    Required Braces or Orthoses  Non-applicable    Communication Preference  Verbal    Pain  no c/o pain    Equipment / Environment  Vascular access (PIV, TLC, Port-a-cath, PICC);Telemetry    Living Situation   Living environment: House   Lives With: Spouse   Home Living: One level home;Stairs to enter with rails;Tub/shower unit;Hand-held shower hose   Equipment available at home: None   Rail placement (outside): Rail on right side        Cognition   Orientation Level:  Oriented x 4   Arousal/Alertness:  Appropriate responses to stimuli     Vision / Perception  Vision: Wears glasses all the time        Hand Function  Hand Dominance: R  Gout deformities in MCP joints of thumb bilateraly; grip strength/coordination WFL     Skin Inspection  all visible skin appears intact    ROM / Strength/Coordination  UE ROM/ Strength/ Coordination: Decreased BUE ROM, shoulder flexion ~90* on RUE and 120* LUE 2/2 prior shoulder injuries. Decreased BUE strength about 4/5  LE ROM/ Strength/ Coordination: refer to PT evaluation    Sensation:  no c/o numbness/tingling/LOS    Balance:  independently for dynamic sitting and standing balance    Mobility/Gait/Transfers: independent for sup>EOB, ambulating to toilet, toilet t/f    ADL:  Bathing: anticipate independence  Grooming: independent standing at sink  Dressing: independent with full body dressing  Eating: independent  Toileting: independent     Vitals/ Orthostatics:  At Rest: NAD  With Activity: NAD     Interventions Performed During Today's Session: AMPAC: 24/24, pt educated on role of OT, POC, energy conservation and work simplification, LB dressing, toilet t/f, standing grooming, hall level ambulation, DME recommendations for TTB, fall prevention     Eval Duration (OT): 20 Min.    Medical Staff Made Aware: RN, Azure    I attest that I have reviewed the above information.  Signed: Leeroy Bock, OT  Filed 10/18/2018

## 2018-10-18 NOTE — Unmapped (Signed)
cac @ bedside

## 2018-10-19 DIAGNOSIS — I5023 Acute on chronic systolic (congestive) heart failure: Principal | ICD-10-CM

## 2018-10-19 LAB — RENAL FUNCTION PANEL
ANION GAP: 11 mmol/L (ref 7–15)
BUN / CREAT RATIO: 22
CALCIUM: 10.1 mg/dL (ref 8.5–10.2)
CHLORIDE: 101 mmol/L (ref 98–107)
CO2: 29 mmol/L (ref 22.0–30.0)
CREATININE: 2.76 mg/dL — ABNORMAL HIGH (ref 0.70–1.30)
EGFR CKD-EPI AA MALE: 27 mL/min/{1.73_m2} — ABNORMAL LOW (ref >=60)
EGFR CKD-EPI NON-AA MALE: 23 mL/min/{1.73_m2} — ABNORMAL LOW (ref >=60)
GLUCOSE RANDOM: 88 mg/dL (ref 65–179)
PHOSPHORUS: 3 mg/dL (ref 2.9–4.7)
POTASSIUM: 4.1 mmol/L (ref 3.5–5.0)
SODIUM: 141 mmol/L (ref 135–145)

## 2018-10-19 LAB — CBC
HEMATOCRIT: 37.7 % — ABNORMAL LOW (ref 41.0–53.0)
HEMOGLOBIN: 11.4 g/dL — ABNORMAL LOW (ref 13.5–17.5)
MEAN CORPUSCULAR HEMOGLOBIN CONC: 30.2 g/dL — ABNORMAL LOW (ref 31.0–37.0)
MEAN CORPUSCULAR HEMOGLOBIN: 29 pg (ref 26.0–34.0)
MEAN CORPUSCULAR VOLUME: 96.2 fL (ref 80.0–100.0)
MEAN PLATELET VOLUME: 10.5 fL — ABNORMAL HIGH (ref 7.0–10.0)
PLATELET COUNT: 87 10*9/L — ABNORMAL LOW (ref 150–440)
RED CELL DISTRIBUTION WIDTH: 15.9 % — ABNORMAL HIGH (ref 12.0–15.0)
WBC ADJUSTED: 4 10*9/L — ABNORMAL LOW (ref 4.5–11.0)

## 2018-10-19 LAB — MEAN CORPUSCULAR HEMOGLOBIN: Lab: 29

## 2018-10-19 LAB — PHOSPHORUS: Phosphate:MCnc:Pt:Ser/Plas:Qn:: 3

## 2018-10-19 LAB — TACROLIMUS, TROUGH: Lab: 8.8

## 2018-10-19 MED ORDER — FUROSEMIDE 40 MG TABLET
ORAL_TABLET | Freq: Two times a day (BID) | ORAL | 0 refills | 0.00000 days | Status: CP
Start: 2018-10-19 — End: 2018-12-28

## 2018-10-19 MED ORDER — FERROUS SULFATE 325 MG (65 MG IRON) TABLET
Freq: Every day | ORAL | 0 refills | 0.00000 days
Start: 2018-10-19 — End: ?

## 2018-10-19 NOTE — Unmapped (Signed)
Med B Daily Progress Note    Subjective / 24hr Events:   -- No acute events overnight.   -- Patient reports significantly improved shortness of breath and decreased swelling after 80mg  IV Lasix administered last night.    Assessment/Plan:    Miguel Ward is a 67 y.o. male w/ PMHx of ESRD 2/2 lupus nephritis (s/p DDKT on 06/11/2007, RCC (s/p R renal nephrectomy (02/20/2014)), CAD, HTN,??prior??pericardial effusion, hypothyroidism, and gout who presented to [facility] with dyspnea and anasarca concerning for heart failure exacerbation.     Principal Problem:    Acute on chronic HFrEF (heart failure with reduced ejection fraction) (CMS-HCC)  Active Problems:    Hypercholesterolemia    Hypertension    History of kidney transplant    Renal cell carcinoma (CMS-HCC)    Normocytic anemia    Gout    GERD (gastroesophageal reflux disease)  Resolved Problems:    * No resolved hospital problems. *    #Acute on Chronic HFrEF w/ Anasarca: Pt with known HF w/ EF of 40-45% previously. As noted in HPI, patient reports dyspnea, orthopnea and worsening edema. He also notes a 10lb weight gain and his weight of 85.8kg is notably above more recent dry weight of 80kg. Pro-BNP elevated at > 24,000. Trop elevated to 0.090 on day of admission, increased to 0.098 then decreased to 0.084 on 11/5. EKG unchanged from prior. Recently increased home dose to 40mg  Lasix PO BID with some benefit. IV Lasix 80mg  in the ED effectively reduced edema.  -- Lasix to 80mg  PO BID.  -- Continue metoprolol succinate 100 mg QD  -- TTE ordered, f/u   -- Continue telemetry for total of 48 hrs  -- Cont ASA 81mg  daily  -- Strict I/Os, daily weights  -- PT/OT  -- Na restricted diet  ??  #Acute on Chronic Renal Failure in patient w/ h/o kidney transplant: Follows with Dr. Posey Boyer True. S/p deceased donor??kidney transplant on 06/11/2007 for ESRD 2/2 lupus nephritis and s/p R renal nephrectomy (02/20/2014) 2/2 RCC. Cr at admission of 3.06, which is above recent Cr levels of 2.3-2.8. Transplant renal US only notable for ascites.Cr 11/5 decreased to 2.86 following diuresis, indicating that Cr bump was likely 2/2 to volume overload. Only minimal proteinuria on UA.   -- Diuresis as above    -- Hold enalapril for now  -- Continue PO Calcitriol 0.25 mcg M/W/F  -- Continue Mycophenolate 250 mg BID  -- Continue Tacrolimus 3 mg Q12H - trough level 18.2 on 11/5  -- Continue Prednisone 5mg  daily  -- Continue sodium bicarbonate 650 mg BID  -- Continue acyclovir 400 mg qd ppx  -- Daily renal function panel     #Elevated Troponin-  Peaked at 0.098 on 11/5. Asymptomatic. No EKG changes. Down-trended by 1400 on 11/5. Thought to be 2/2 HF exacerbation, as above.   -- Continue tele   ??  #Right arm swelling: On PE 11/5, noted to have asymmetric pitting edema edem in R forearm and non-pitting edema surrounding AV fistula sites.   -- PVL bilateral UE ordered, f/u results     Chronic Stable Medical Conditions:  CAD s/p stent (2010)/HTN: Given resolving AKI, continue to hold enalapril 5mg  BID for now  -- Continue metoprolol 100 mg qd, as above  HLD: PO atorvastatin 20 mg daily  Normocytic anemia: Continue PO ferrous sulfate 325 mg daily  - daily CBC  Hypothyroidism: Continue home levothyroxine 250 mcg QD  -- TSH and T4 pending, f/u results  GERD: Continue home Protonix 20 mg qd  Gout: Continue home Allopurinol 150 mg PO daily   ??  Daily Checklist:  Diet: Sodium restricted  DVT PPx:??heparin 5,000U subq q8hrs  GI PPx: Protonix 20 mg daily  Code Status:??DNR/DNI, wife is surrogate, see ACP note  Dispo: Med B, floor status     ___________________________________________________________________    Labs/Studies:  Labs and Studies from the last 24hrs per EMR and Reviewed     Objective:  Temp:  [36.3 ??C (97.3 ??F)-36.7 ??C (98.1 ??F)] 36.3 ??C (97.3 ??F)  Heart Rate:  [62-72] 66  Resp:  [16-19] 16  BP: (149-185)/(76-91) 149/82  SpO2:  [97 %-100 %] 99 %    GEN: NAD, lying in bed  EYES: sclera anicteric, EOMI  ENT: MMM, oropharynx w/o erythema or exudate, right periorbital swelling  CV: RRR, no murmurs or gallops appreciated  PULM: Faint crackles at lung bases bilaterally, normal work of breathing on RA  ABD: soft, distended with pitting edema, slightly tender diffusely, +BS  EXT: 1+ pitting pretibial and thigh edema bilaterally, RUE with non-pitting edema from elbow to dorsum of hand, nontender joint abnormalities on hands 2/2 gout  NEURO: No focal deficits, gait grossly normal  PSYCH: A&Ox3, appropriate mood and affect    Matt Lipner  I attest that I have reviewed the medical student note and that the components of the history of the present illness, the physical exam, and the assessment and plan documented were performed by me or were performed in my presence by the student where I verified the documentation and performed (or re-performed) the exam and medical decision making. Donah Driver, MD

## 2018-10-19 NOTE — Unmapped (Signed)
Pt free of falls. BP elevated in am, all cardiac meds provided, bp improved. Other VS stable. RUE swollen large than left, provider notified. PVL completed this shift. Oral lasix provided. I/O monitored, of urine this shift. Lost PIV, IV team consulted but they would not place IV since pt did not have IV meds ordered.     Problem: Adult Inpatient Plan of Care  Goal: Plan of Care Review  Outcome: Progressing  Goal: Patient-Specific Goal (Individualization)  Outcome: Progressing  Goal: Absence of Hospital-Acquired Illness or Injury  Outcome: Progressing  Goal: Optimal Comfort and Wellbeing  Outcome: Progressing  Goal: Readiness for Transition of Care  Outcome: Progressing  Goal: Rounds/Family Conference  Outcome: Progressing     Problem: Adjustment to Illness (Heart Failure)  Goal: Optimal Coping  Outcome: Progressing     Problem: Cardiac Output Decreased (Heart Failure)  Goal: Optimal Cardiac Output  Outcome: Progressing     Problem: Fluid Imbalance (Heart Failure)  Goal: Fluid Balance  Outcome: Progressing     Problem: Functional Ability Impaired (Heart Failure)  Goal: Optimal Functional Ability  Outcome: Progressing     Problem: Respiratory Compromise (Heart Failure)  Goal: Effective Oxygenation and Ventilation  Outcome: Progressing     Problem: Fall Injury Risk  Goal: Absence of Fall and Fall-Related Injury  Outcome: Progressing     Problem: Fluid Volume Excess  Goal: Fluid Balance  Outcome: Progressing     Problem: Hypertension Acute  Goal: Blood Pressure Within Desired Range  Outcome: Progressing

## 2018-10-19 NOTE — Unmapped (Signed)
Med B Daily Progress Note    Subjective / 24hr Events:   -- No acute events overnight.   -- Patient reports continued improvement in shortness of breath and swelling after transitioning to Lasix 80mg  BID. Net negative 2L from 0700 11/04-699 11/06  -- Creatinine remains improved today to 2.76 even with increased diuresis     Assessment/Plan:    Miguel Ward is a 67 y.o. male w/ PMHx of ESRD 2/2 lupus nephritis (s/p DDKT on 06/11/2007, RCC (s/p R renal nephrectomy (02/20/2014)), CAD, HTN,??prior??pericardial effusion, hypothyroidism, and gout who presented to [facility] with dyspnea and anasarca concerning for heart failure exacerbation.     Principal Problem:    Acute on chronic HFrEF (heart failure with reduced ejection fraction) (CMS-HCC)  Active Problems:    Hypercholesterolemia    Hypertension    History of kidney transplant    Renal cell carcinoma (CMS-HCC)    Normocytic anemia    Gout    GERD (gastroesophageal reflux disease)  Resolved Problems:    * No resolved hospital problems. *    #Acute on Chronic HFrEF w/ Anasarca: Pt with known HF (EF 45% and grade III diastolic dysfunction on 10/19/18 echo) who presented with worsening dyspnea, orthopnea, edema, and 10lb weight gain. Pro-BNP elevated at > 24,000 on admission. Trop elevated to 0.090 on day of admission, peaked at 0.098 then decreased to 0.084 on 11/5. No EKG changes.  PVL of AVF on 11/6 with 12L/min flow through AVF, consistent with high output heart failure 2/2 high flow through AVF.   -- Continue Lasix 80mg  PO BID.  -- Continue metoprolol succinate 100 mg QD  -- Continue telemetry for total of 48 hrs  -- Cont ASA 81mg  daily  -- Vascular surgery consulted for Miller banding procedure of AVF  -- Strict I/Os, daily weights  -- PT/OT  -- Na restricted diet  ??  #Acute on Chronic Renal Failure in patient w/ h/o kidney transplant: Follows with Dr. Posey Boyer True. S/p deceased donor??kidney transplant on 06/11/2007 for ESRD 2/2 lupus nephritis and s/p R renal nephrectomy (02/20/2014) 2/2 RCC. Cr at admission of 3.06, which is above recent Cr levels of 2.3-2.8. Transplant renal US only notable for ascites.Cr 11/5 decreased to 2.86 then 2.76 with diuresis, indicating that Cr bump was 2/2 to volume overload. Only minimal proteinuria on UA.   -- Continue diuresis as above    -- Hold enalapril for now  -- Continue PO Calcitriol 0.25 mcg M/W/F  -- Continue Mycophenolate 250 mg BID  -- Continue Tacrolimus 3 mg Q12H - trough level 18.2 on 11/5  -- Continue Prednisone 5mg  daily  -- Continue sodium bicarbonate 650 mg BID  -- Continue acyclovir 400 mg qd ppx  -- Daily renal function panel     #Elevated Troponin-  Peaked at 0.098 on 11/5. Asymptomatic. No EKG changes. Down-trended by 1400 on 11/5. Thought to be 2/2 HF exacerbation, as above.   -- Continue tele   ??  #Right arm swelling: On PE 11/5, noted to have asymmetric pitting edema edem in R forearm and non-pitting edema surrounding AV fistula sites. PVL bilateral UE showed no evidence of DVT  in the central veins or arm veins and patent AV fistula in the RUE.   -- Vascular surgery consulted for ?Miller banding procedure of AVF as above      Chronic Stable Medical Conditions:  CAD s/p stent (2010)/HTN: Given resolving AKI, continue to hold enalapril 5mg  BID for now  -- Continue metoprolol  100 mg qd, as above  HLD: PO atorvastatin 20 mg daily  Normocytic anemia: Continue PO ferrous sulfate 325 mg daily  - daily CBC  Hypothyroidism: Continue home levothyroxine 250 mcg QD  -- TSH and T4 pending, f/u results   GERD: Continue home Protonix 20 mg qd  Gout: Continue home Allopurinol 150 mg PO daily   ??  Daily Checklist:  Diet: Sodium restricted  DVT PPx:??heparin 5,000U subq q8hrs  GI PPx: Protonix 20 mg daily  Code Status:??DNR/DNI, wife is surrogate, see ACP note  Dispo: Med B, floor status     ___________________________________________________________________    Labs/Studies:  Labs and Studies from the last 24hrs per EMR and Reviewed Objective:  Temp:  [36.6 ??C (97.9 ??F)] 36.6 ??C (97.9 ??F)  Heart Rate:  [63-73] 67  Resp:  [16] 16  BP: (120-177)/(73-88) 120/73  SpO2:  [96 %-99 %] 96 %    GEN: NAD, sitting in bed  EYES: sclera anicteric, EOMI  ENT: MMM, oropharynx w/o erythema or exudate, mild right periorbital swelling  CV: RRR, no murmurs or gallops appreciated  PULM: Faint crackles at lung bases bilaterally, normal work of breathing on RA  ABD: soft, nondistended, nontender, +BS  EXT: 1+ pitting pretibial and thigh edema bilaterally, RUE with pitting edema from elbow to dorsum of hand, nontender joint abnormalities on hands 2/2 gout  NEURO: No focal deficits, gait grossly normal  PSYCH: A&Ox3, appropriate mood and affect    Jenean Lindau, MS3  I attest that I have reviewed the medical student note and that the components of the history of the present illness, the physical exam, and the assessment and plan documented were performed by me or were performed in my presence by the student where I verified the documentation and performed (or re-performed) the exam and medical decision making. Miguel Driver, MD

## 2018-10-19 NOTE — Unmapped (Signed)
Patient A&Ox4, VSS. Patient denied pain, chest pain, SOB, N/V/D. Patient ambulated in room, free from falls Patient with BM during shift that was loose. Patient UOP of and had standing weight this morning. Will continue to monitor.   Problem: Adult Inpatient Plan of Care  Goal: Plan of Care Review  Outcome: Progressing  Goal: Patient-Specific Goal (Individualization)  Outcome: Progressing  Goal: Absence of Hospital-Acquired Illness or Injury  Outcome: Progressing  Goal: Optimal Comfort and Wellbeing  Outcome: Progressing  Goal: Readiness for Transition of Care  Outcome: Progressing  Goal: Rounds/Family Conference  Outcome: Progressing     Problem: Adjustment to Illness (Heart Failure)  Goal: Optimal Coping  Outcome: Progressing     Problem: Cardiac Output Decreased (Heart Failure)  Goal: Optimal Cardiac Output  Outcome: Progressing     Problem: Fluid Imbalance (Heart Failure)  Goal: Fluid Balance  Outcome: Progressing     Problem: Functional Ability Impaired (Heart Failure)  Goal: Optimal Functional Ability  Outcome: Progressing     Problem: Respiratory Compromise (Heart Failure)  Goal: Effective Oxygenation and Ventilation  Outcome: Progressing     Problem: Fluid Volume Excess  Goal: Fluid Balance  Outcome: Progressing     Problem: Hypertension Acute  Goal: Blood Pressure Within Desired Range  Outcome: Progressing     Problem: Hypertension Comorbidity  Goal: Blood Pressure in Desired Range  Outcome: Progressing

## 2018-10-20 DIAGNOSIS — I5023 Acute on chronic systolic (congestive) heart failure: Principal | ICD-10-CM

## 2018-10-20 LAB — RENAL FUNCTION PANEL
ALBUMIN: 3.3 g/dL — ABNORMAL LOW (ref 3.5–5.0)
ANION GAP: 10 mmol/L (ref 7–15)
BLOOD UREA NITROGEN: 60 mg/dL — ABNORMAL HIGH (ref 7–21)
BUN / CREAT RATIO: 21
CALCIUM: 9.8 mg/dL (ref 8.5–10.2)
CHLORIDE: 100 mmol/L (ref 98–107)
CO2: 29 mmol/L (ref 22.0–30.0)
CREATININE: 2.82 mg/dL — ABNORMAL HIGH (ref 0.70–1.30)
EGFR CKD-EPI AA MALE: 26 mL/min/{1.73_m2} — ABNORMAL LOW (ref >=60)
EGFR CKD-EPI NON-AA MALE: 22 mL/min/{1.73_m2} — ABNORMAL LOW (ref >=60)
GLUCOSE RANDOM: 93 mg/dL (ref 65–179)
PHOSPHORUS: 3.4 mg/dL (ref 2.9–4.7)
POTASSIUM: 4.2 mmol/L (ref 3.5–5.0)

## 2018-10-20 LAB — CREATININE: Creatinine:MCnc:Pt:Ser/Plas:Qn:: 2.82 — ABNORMAL HIGH

## 2018-10-20 NOTE — Unmapped (Signed)
Patient A&Ox4, VSS. Patient denied pain, SOB, N/V/D. Patient given PO Lasix, SCD on overnight. Patient ambulated in room, standing weight done this morning, UOP . Will continue to monitor.   Problem: Adult Inpatient Plan of Care  Goal: Plan of Care Review  Outcome: Progressing  Goal: Patient-Specific Goal (Individualization)  Outcome: Progressing  Goal: Absence of Hospital-Acquired Illness or Injury  Outcome: Progressing  Goal: Optimal Comfort and Wellbeing  Outcome: Progressing  Goal: Readiness for Transition of Care  Outcome: Progressing  Goal: Rounds/Family Conference  Outcome: Progressing     Problem: Adjustment to Illness (Heart Failure)  Goal: Optimal Coping  Outcome: Progressing     Problem: Cardiac Output Decreased (Heart Failure)  Goal: Optimal Cardiac Output  Outcome: Progressing     Problem: Fluid Imbalance (Heart Failure)  Goal: Fluid Balance  Outcome: Progressing     Problem: Functional Ability Impaired (Heart Failure)  Goal: Optimal Functional Ability  Outcome: Progressing     Problem: Respiratory Compromise (Heart Failure)  Goal: Effective Oxygenation and Ventilation  Outcome: Progressing     Problem: Fall Injury Risk  Goal: Absence of Fall and Fall-Related Injury  Outcome: Progressing     Problem: Fluid Volume Excess  Goal: Fluid Balance  Outcome: Progressing     Problem: Hypertension Acute  Goal: Blood Pressure Within Desired Range  Outcome: Progressing     Problem: Heart Failure Comorbidity  Goal: Maintenance of Heart Failure Symptom Control  Outcome: Progressing     Problem: Hypertension Comorbidity  Goal: Blood Pressure in Desired Range  Outcome: Progressing

## 2018-10-20 NOTE — Unmapped (Addendum)
For outpatient providers:  1. Follow-up in *** weeks after Miller banding procedure.  2. Monitor for resolution of AKI then restart home enalapril.      Hospital Course:    Miguel Ward is a 67 y.o. male w/ PMHx of ESRD 2/2 lupus nephritis (s/p DDKT??on 06/11/2007, RCC (s/p R renal nephrectomy (02/20/2014)), CAD, HTN,??prior??pericardial effusion, hypothyroidism, and gout??who presented to [facility] with dyspnea and anasarca concerning for heart failure exacerbation.     Acute on Chronic HFrEF w/ Anasarca: Patient reports worsening dyspnea, orthopnea, edema, and 10lb weight gain over the past few weeks. On admission, pro-BNP elevated at > 24,000 consistent with 09/2018 elevation. Trop elevated to 0.090 on day of admission, peaked at 0.098 then decreased to 0.084 on 11/5. During hospitalization, the patient was diuresed with Lasix (initially IV transitioned to PO), leading to 10lb weight loss and effective reduction of edema. EKG was stable from prior and echo showed unchanged EF45% compared to 02/2018. Due to right arm edema distal to the patient's hemodialysis AV fistula, PVLs were performed to investigate high fistula blood flow as a cause of heart failure exacerbation. PVLs showed increased flow up to 12,000 mL/min, leading to VIR consultation for Miller banding of the fistula and [procedure was performed on 11/*** vs scheduled as an outpatient for 11/***].  ??  Acute on Chronic Renal Failure in patient w/??h/o kidney transplant: The patient follows with Dr. Posey Boyer True. S/p deceased donor??kidney transplant on 06/11/2007 for ESRD 2/2 lupus nephritis and s/p R renal nephrectomy (02/20/2014) 2/2 RCC. Cr at??admission of 3.06, which is above recent Cr levels of 2.3-2.8. Transplant renal US only notable for ascites.Cr 11/5 decreased to 2.86 then 2.76 with diuresis, indicating that Cr bump was 2/2 to volume overload. The patient was continued on home regimen of calcitriol, Mycophenolate, Tacrolimus, prednisone, sodium bicarbonate, and acyclovir.  ??  Chronic Stable Medical Conditions:  CAD s/p stent (2010)/HTN:??The patient's home enalapril was held due to AKI. Continued on home metoprolol.  HLD: Continued on home atorvastatin.  Normocytic anemia: Continud on home ferrous sulfate  Hypothyroidism: Continued on home levothyroxine.  GERD: Continued on home Protonix  Gout: Continued on home Allopurinol

## 2018-10-20 NOTE — Unmapped (Signed)
Pharmacist Discharge Note  Patient Name: Miguel Ward  Reason for admission: Dyspnea and anasarca concerning for heart failure exacerbation.   Reason for writing this note: patient requires medication-related outpatient intervention and/or monitoring    Highlighted medication changes :  S/P kidney transplant 2008; Continue Tacrolimus 3mg  po bid; Trough goal = 6-68mcg/ml  Last trough prior to discharge: 8.8 (10 hr trough);     Change lasix 80mg  po bid (increase in dose)    Stop Enalapril (due to AKI); May be restarted once Cr back to baseline (2.0-2.2)    Medication access:  - No barriers identified    Outpatient follow-up:  [ ]  Cr and electrolytes, Tac trough;     Andres Shad Lashara Urey   Clinical Pharmacist    Future Appointments   Date Time Provider Department Center   10/27/2018  3:30 PM Vernona Rieger, MD [facility]   11/22/2018 11:30 AM Posey Boyer True, MD [facility]

## 2018-10-20 NOTE — Unmapped (Signed)
Transplant Surgery Consult Note  Date: 10/20/2018  Consulting attending: Dr. Norma Fredrickson  Requesting service: MDB  Requesting physician: Griffin Dakin, MD    Assessment:  [phone] with a PMHx of ESRD secondary to lupus nephritis (s/p ddkt in June 2008) who presented to [facility] with a heart failure exacerbation. ECHO with EF of 45%, vascular duplex of the AV-fistula is with 12L per minute which is concerning for high output AVF. He is an appropriate candidate for banding. I personally discussed the risks, benefits, and alternatives of the procedure with the patient.  These risks included but were not limited to infection, bleeding, nerve injury, arterial damage, and need for further interventions to maintain the fistula and/or failure of the AVF.  The patient understood these risks, benefits and alternatives and decided to proceed with operative intervention.  An informed consent was obtained.      Recommendations:  - OR tomorrow morning 11/8 for an AVF revision and banding, possible graft placement, and possible ligation.   - Consent signed and placed in the chart. Patient marked.   - NPO @ [facility]    Thank you for inviting [place] to assist in the care of your patient. Please feel free to page the Transplant Surgery consult pager at [phone] with any questions or changes in clinical condition. We will continue to follow with you.    This patient was discussed and evaluated with Dr. Norma Fredrickson (Attending) who agree with the plan.                             History of Present Illness:  CHIRSTOPHER Ward is a 67 y.o. male with PMH of ESRD secondary to lupus nephritis (s/p ddkt in June 2008) who presented to [facility] with a heart failure exacerbation. He presented to [facility] on 10/17/18 with two weeks of dyspnea and a ten pound weight gain. Workup showed concern for worsening CHF with a preserved EF of 45%. After four liters of diuresis his symptoms have improved.     Today he feels well. He is right handed and has a RUE fistula in place due to failed attempts on the LUE. He has been off dialysis since his renal transplant in 2008. He denies any right hand weakness or tingling.     Past Medical History:  Past Medical History:   Diagnosis Date   ??? Anemia    ??? CHF (congestive heart failure) (CMS-HCC)    ??? Coronary artery disease    ??? Disease of thyroid gland    ??? GERD (gastroesophageal reflux disease)    ??? Gout    ??? Hyperlipidemia    ??? Hypertension    ??? Lupus nephritis (CMS-HCC)    ??? NSTEMI (non-ST elevated myocardial infarction) (CMS-HCC)    ??? Red blood cell antibody positive     anti-K   ??? Renal cancer, right (CMS-HCC)    ??? Renal transplant, status post        Past Surgical History:   Past Surgical History:   Procedure Laterality Date   ??? AV FISTULA PLACEMENT Right    ??? HERNIA REPAIR     ??? PR BREATH HYDROGEN TEST N/A 06/24/2016    Procedure: BREATH HYDROGEN TEST;  Surgeon: Nurse-Based Giproc;  [facility];  Service: Gastroenterology   ??? PR COLONOSCOPY FLX DX W/COLLJ SPEC WHEN PFRMD N/A 05/22/2016    Procedure: COLONOSCOPY, FLEXIBLE, PROXIMAL TO SPLENIC FLEXURE; DIAGNOSTIC, W/WO COLLECTION SPECIMEN BY BRUSH  OR WASH;  Surgeon: Liane Comber, MD;  Location: [facility] PROCEDURES [facility];  Service: Gastroenterology   ??? PR NEPHRECTOMY Right 02/20/2014    Procedure: LAPAROSCOPY, SURGICAL; NEPHRECTOMY, INCLUDING PARTIAL URETERECTOMY;  Surgeon: Vivi Barrack, MD;  Location: MAIN OR [facility];  Service: Transplant   ??? TRANSPLANTATION RENAL         Social History:  Social History     Socioeconomic History   ??? Marital status: Married     Spouse name: Not on file   ??? Number of children: Not on file   ??? Years of education: Not on file   ??? Highest education level: Not on file   Occupational History   ??? Not on file   Social Needs   ??? Financial resource strain: Not on file   ??? Food insecurity:     Worry: Not on file     Inability: Not on file   ??? Transportation needs:     Medical: Not on file     Non-medical: Not on file   Tobacco Use   ??? Smoking status: Never Smoker   ??? Smokeless tobacco: Never Used   Substance and Sexual Activity   ??? Alcohol use: No     Alcohol/week: 0.0 standard drinks   ??? Drug use: No   ??? Sexual activity: Not Currently   Lifestyle   ??? Physical activity:     Days per week: Not on file     Minutes per session: Not on file   ??? Stress: Not on file   Relationships   ??? Social connections:     Talks on phone: Not on file     Gets together: Not on file     Attends religious service: Not on file     Active member of club or organization: Not on file     Attends meetings of clubs or organizations: Not on file     Relationship status: Not on file   Other Topics Concern   ??? Do you use sunscreen? No   ??? Tanning bed use? No   ??? Are you easily burned? No   ??? Excessive sun exposure? No   ??? Blistering sunburns? No   Social History Narrative    02/11/18: The patient lives with his wife, his daughter, and his two grandchildren in [place], [place]. 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 No history of Financial planner. No smoking history. No reported alcohol history.        Family History:   Family History   Problem Relation Age of Onset   ??? Heart disease Mother    ??? Diabetes Mother    ??? Hypertension Mother    ??? Cancer Father    ??? Alcohol abuse Brother    ??? Cardiomyopathy Neg Hx    ??? Sudden Cardiac Death Neg Hx    ??? Melanoma Neg Hx    ??? Basal cell carcinoma Neg Hx    ??? Squamous cell carcinoma Neg Hx        Medication:  No current facility-administered medications on file prior to encounter.      Current Outpatient Medications on File Prior to Encounter   Medication Sig Dispense Refill   ??? acyclovir (ZOVIRAX) 400 MG tablet TAKE (1) TABLET BY MOUTH EVERY DAY 90 tablet 3   ??? allopurinol (ZYLOPRIM) 100 MG tablet Take 1.5 tablets (150 mg  total) by mouth daily. 135 tablet 3   ??? aspirin (ASPIRIN LOW-STRENGTH) 81 MG chewable tablet Chew 81 mg daily.     ??? atorvastatin (LIPITOR) 20 MG tablet Take 1 tablet (20 mg total) by mouth daily. 90 tablet 3   ??? calcitriol (ROCALTROL) 0.25 MCG capsule Take 1 capsule (0.25 mcg total) by mouth Every Monday, Wednesday, and Friday. 36 capsule 3   ??? CELLCEPT 250 mg capsule TAKE 1 CAPSULE BY MOUTH TWICE DAILY 180 each 3   ??? colchicine (COLCRYS) 0.6 mg tablet Take 1 tablet (0.6 mg total) by mouth daily as needed. for up to 1 dose 30 tablet 11   ??? enalapril (VASOTEC) 10 MG tablet Take 1 tablet (10 mg total) by mouth Two (2) times a day. 180 tablet 3   ??? fluticasone (FLONASE) 50 mcg/actuation nasal spray 1 spray into each nostril daily as needed.     ??? hydrocortisone 2.5 % ointment Only when needed in front of ear; can used more broadly on lower legs if needed for itching. 453 g 1   ??? levothyroxine (SYNTHROID) 200 MCG tablet Take 1 tab with two tab, total dose daily 90 tablet 3   ??? levothyroxine (SYNTHROID, LEVOTHROID) 25 MCG tablet Take 2 tabs with one tab, total dose daily 90 tablet 3   ??? magnesium oxide (MAG-OX) 400 mg (241.3 mg magnesium) tablet Take 1 tablet (400 mg total) by mouth Two (2) times a day. 180 tablet 3   ??? metoprolol succinate (TOPROL-XL) 100 MG 24 hr tablet Take 1 tablet (100 mg total) by mouth daily. 90 tablet 3   ??? omeprazole (PRILOSEC) 20 MG capsule Take 1 capsule (20 mg total) by mouth daily. 90 capsule 3   ??? predniSONE (DELTASONE) 5 MG tablet Take 1 tablet (5 mg total) by mouth daily. 90 tablet 3   ??? PROGRAF 1 mg capsule TAKE 3 CAPSULES (3MG ) BY MOUTH TWICE DAILY 180 capsule 11   ??? simethicone (GAS-X) 80 MG chewable tablet Chew 1 tablet (80 mg total) every six (6) hours as needed for flatulence. 30 tablet 1   ??? sodium bicarbonate 650 mg tablet Take 1 tablet (650 mg total) by mouth Two (2) times a day. 180 tablet 3   ??? triamcinolone (KENALOG) 0.1 % ointment Apply topically Two (2) times a day. For only 2 week BID topically from itchy area in front of ears. 80 g 1       Allergies:  Allergies   Allergen Reactions   ??? Penicillins Hives     Other reaction(s): HIVES       Review of systems:   Negative except as noted in the History of Present Illness.    Vital Signs:  BP 149/81  - Pulse 66  - Temp 36.5 ??C (Oral)  - Resp 16  - Ht 182.9 cm (6')  - Wt 81.1 kg (178 lb 12.8 oz)  - SpO2 98%  - BMI 24.25 kg/m??     Physical Exam:  Neuro: alert and oriented x4, pain well controlled  CV: regular rate and rhythm  Pulm: normal work of breathing, equal chest rise and fall  Abdo: soft, distended, non-tender  Extr: RUE AV-Fistula is aneurysmal with pronounced palpable thrill.   Psych: AOx3. Appropriate    Labs  Lab Results   Component Value Date    WBC 4.0 (L) 10/19/2018    HGB 11.4 (L) 10/19/2018    HCT 37.7 (L) 10/19/2018    PLT 87 (L)  10/19/2018       Lab Results   Component Value Date    NA 139 10/20/2018    K 4.2 10/20/2018    CL 100 10/20/2018    CO2 29.0 10/20/2018    BUN 60 (H) 10/20/2018    CREATININE 2.82 (H) 10/20/2018    GLU 93 10/20/2018    CALCIUM 9.8 10/20/2018    MG 2.0 09/29/2018    PHOS 3.4 10/20/2018       Lab Results   Component Value Date    BILITOT 1.1 10/17/2018    BILIDIR 0.40 08/30/2016    PROT 7.0 10/17/2018    ALBUMIN 3.3 (L) 10/20/2018    ALT 16 10/17/2018    AST 25 10/17/2018    ALKPHOS 143 (H) 10/17/2018    GGT 30 01/26/2014       Lab Results   Component Value Date    LABPROT 12.7 (H) 05/23/2014    INR 1.45 10/17/2018    APTT 35.3 10/17/2018      Imaging  -2 cm proximal to anastomosis-17.0 mm -2.8 mm -261 cm/s-7057 ml/min -  -(outflow vein)              -        -       -        -            -  +----------------------------+--------+-------+--------+------------+  -4 cm proximal to anastomosis-26.5 mm -0.7 mm -118 cm/s-12405 ml/min-  -(outflow vein)              -        -       -        -            -  +----------------------------+--------+-------+--------+------------+  -8 cm proximal to anastomosis-34.5 mm -0.4 mm -132 cm/s-9802 ml/min -

## 2018-10-20 NOTE — Unmapped (Signed)
Med B Daily Progress Note    Subjective / 24hr Events:   -- No acute events overnight. Feeling well this morning. Dyspnea and edema improved.   -- Surgery evaluated the patient and will plan to band fistula 11/8     Assessment/Plan:    Miguel Ward is a 67 y.o. male w/ PMHx of ESRD 2/2 lupus nephritis (s/p DDKT on 06/11/2007, RCC (s/p R renal nephrectomy (02/20/2014)), CAD, HTN,??prior??pericardial effusion, hypothyroidism, and gout who presented to [facility] with dyspnea and anasarca concerning for heart failure exacerbation.     Principal Problem:    Acute on chronic HFrEF (heart failure with reduced ejection fraction) (CMS-HCC)  Active Problems:    Hypercholesterolemia    Hypertension    History of kidney transplant    Renal cell carcinoma (CMS-HCC)    Normocytic anemia    Gout    GERD (gastroesophageal reflux disease)  Resolved Problems:    * No resolved hospital problems. *    #Acute on Chronic Systolic Heart Failure: Pt with known HF (EF 45% and grade III diastolic dysfunction on 10/19/18 echo) who presented with worsening dyspnea, orthopnea, edema, and 10lb weight gain. Pro-BNP elevated at > 24,000 on admission. Trop elevated to 0.090 on day of admission, peaked at 0.098 then decreased to 0.084 on 11/5. No EKG changes.  PVL of AVF on 11/6 with 12L/min flow through AVF, consistent with high output heart failure 2/2 high flow through AVF.   -- Continue Lasix 80mg  PO BID.  -- Continue metoprolol succinate 100 mg QD  -- Cont ASA 81mg  daily  -- NPO at [facility] for fistula banding with vascular surgery  -- Strict I/Os, daily weights  -- PT/OT  -- Na restricted diet  ??  #Acute on Chronic Renal Failure in patient w/ h/o kidney transplant: Follows with Dr. Posey Boyer True. S/p deceased donor??kidney transplant on 06/11/2007 for ESRD 2/2 lupus nephritis and s/p R renal nephrectomy (02/20/2014) 2/2 RCC. Cr at admission of 3.06, which is above recent Cr levels of 2.3-2.8. Transplant renal US only notable for ascites.Cr 11/5 decreased to 2.86 then 2.76 with diuresis, indicating that Cr bump was 2/2 to volume overload. Only minimal proteinuria on UA.   -- Continue diuresis as above    -- Hold enalapril for now  -- Continue PO Calcitriol 0.25 mcg M/W/F  -- Continue Mycophenolate 250 mg BID  -- Continue Tacrolimus 3 mg Q12H - trough level 8.8 on 11/6  -- Continue Prednisone 5mg  daily  -- Continue sodium bicarbonate 650 mg BID  -- Continue acyclovir 400 mg qd ppx  -- Daily renal function panel     #Elevated Troponin-  Peaked at 0.098 on 11/5. Asymptomatic. No EKG changes. Down-trended by 1400 on 11/5. Thought to be 2/2 HF exacerbation, as above.   ??  #Right arm swelling: On PE 11/5, noted to have asymmetric pitting edema edem in R forearm and non-pitting edema surrounding AV fistula sites. PVL bilateral UE showed no evidence of DVT  in the central veins or arm veins and patent AV fistula in the RUE.   -- Plan for fistula banding as above      Chronic Stable Medical Conditions:  CAD s/p stent (2010)/HTN: Given resolving AKI, continue to hold enalapril 5mg  BID for now  -- Continue metoprolol 100 mg qd, as above  HLD: PO atorvastatin 20 mg daily  Normocytic anemia: Continue PO ferrous sulfate 325 mg daily  - daily CBC  Hypothyroidism: Continue home levothyroxine 250 mcg QD  --  TSH and T4 pending, f/u results   GERD: Continue home Protonix 20 mg qd  Gout: Continue home Allopurinol 150 mg PO daily   ??  Daily Checklist:  Diet: Sodium restricted  DVT PPx:??heparin 5,000U subq q8hrs  GI PPx: Protonix 20 mg daily  Code Status:??DNR/DNI, wife is surrogate, see ACP note  Dispo: Med B, floor status     ___________________________________________________________________    Labs/Studies:  Labs and Studies from the last 24hrs per EMR and Reviewed     Objective:  Temp:  [36.5 ??C-36.7 ??C] 36.5 ??C  Heart Rate:  [62-66] 66  Resp:  [16-17] 16  BP: (141-161)/(74-82) 149/81  SpO2:  [97 %-98 %] 98 %    GEN: NAD, sitting in bed  EYES: sclera anicteric, EOMI  PULM: Crackles at left base, otherwise clear to auscultation  EXT: 1+ pitting pretibial and thigh edema bilaterally, RUE with pitting edema from elbow to dorsum of hand, nontender joint abnormalities on hands 2/2 gout  PSYCH: A&Ox3, appropriate mood and affect

## 2018-10-20 NOTE — Unmapped (Signed)
Hospitalized-moving specialty refill reminder call to appropriate date and removed call attempt data.

## 2018-10-21 DIAGNOSIS — I5023 Acute on chronic systolic (congestive) heart failure: Principal | ICD-10-CM

## 2018-10-21 LAB — RENAL FUNCTION PANEL
ALBUMIN: 3.6 g/dL (ref 3.5–5.0)
ANION GAP: 10 mmol/L (ref 7–15)
BLOOD UREA NITROGEN: 62 mg/dL — ABNORMAL HIGH (ref 7–21)
BUN / CREAT RATIO: 23
CHLORIDE: 99 mmol/L (ref 98–107)
CO2: 31 mmol/L — ABNORMAL HIGH (ref 22.0–30.0)
CREATININE: 2.66 mg/dL — ABNORMAL HIGH (ref 0.70–1.30)
EGFR CKD-EPI NON-AA MALE: 24 mL/min/{1.73_m2} — ABNORMAL LOW (ref >=60)
GLUCOSE RANDOM: 93 mg/dL (ref 65–99)
PHOSPHORUS: 3.3 mg/dL (ref 2.9–4.7)
POTASSIUM: 4.6 mmol/L (ref 3.5–5.0)
SODIUM: 140 mmol/L (ref 135–145)

## 2018-10-21 LAB — BUN / CREAT RATIO: Urea nitrogen/Creatinine:MRto:Pt:Ser/Plas:Qn:: 23

## 2018-10-21 LAB — TACROLIMUS, TROUGH: Lab: 11.1

## 2018-10-21 MED ORDER — PROGRAF 1 MG CAPSULE
ORAL_CAPSULE | 11 refills | 0 days
Start: 2018-10-21 — End: 2018-10-27

## 2018-10-21 NOTE — Unmapped (Signed)
Brief Operative Note  (CSN: [id number])      Date of Surgery: 10/21/2018    Pre-op Diagnosis: High output heart failure    Post-op Diagnosis: High output heart failure    Procedure(s):  LIGATION OR BANDING OF ANGIOACCESS ARTERIOVENOUS FISTULA, UPPER EXTREMITY: 46962 (CPT??)  Note: Revisions to procedures should be made in chart - see Procedures activity.    Performing Service: Transplant  Surgeon(s) and Role:     * Leona Carry, MD - Primary     * Fredrich Romans, MD - Resident - Assisting    Assistant: None    Findings: Tortuous and aneurysmal RUE braciocephalic AVF. Successful banding to 5mm with 0-prolene. Radial pulse intact throughout and after the procedure.     Anesthesia: General    Estimated Blood Loss: 5 mL    Complications: None    Specimens: None collected    Implants: * No implants in log *    Surgeon Notes: Dr. Norma Fredrickson was present for the key portions of the procedure.     Fredrich Romans   Date: 10/21/2018  Time: 9:15 AM

## 2018-10-21 NOTE — Unmapped (Signed)
Operative Note    Date of Service: 10/21/2018    Preoperative Diagnosis: High throughput heart failure    Postoperative Diagnosis: same    Procedure Performed:   1) Bandiing of right upper extremity arteriovenous fistula    Teaching Surgeon: Katherine Roan, MD    Resident Surgeon: Theo Dills, MD    Anesthesia: MAC    Specimens: None    Implants: None    Estimated Blood Loss: 3 mL    Drains: None    Complications: None    Indications for Surgery: High throughput cardiac failure secondary to a RUE AV-Fistula with twelve liters per minute of flow.     Operative Findings: Large dilated AV-Fistula. Successful placement of an 0-prolene band. Radial artery pulse present throughout the procedure.     Procedure Details:   The patient was identified in the preoperative holding area and brought to the operating room by anesthesia and the nursing staff.  An initial timeout was performed with members of the surgical team and anesthesiology team.  The patient was then placed on the operating room table in the supine position.  Anesthesia was administered successfully. Appropriate lines were placed. The right arm was extended on an arm board. The patient was then prepped and draped in the usual sterile fashion.  A second timeout was performed with all members of the surgical and Anesthesia teams to confirm procedure, laterality and preoperative antibiotics administration.  ??  The right upper extremity arteriovenous fistula was immediately apparent. We started by making a 4 cm longitudinal incision over the junction of the fistula to the brachial artery just distal to the antecubital fossa. We carefully dissected around the proximal portion of the fistula using a combination of blunt dissection and electrocautery. After we obtained circumferential dissection of the proximal portion of the fistula, a white vessel loop was placed around the fistula in a Potts fashion to obtain proximal control.     Per the request of the anesthesiology provider, we cinched down the vessel loop. Occlusion of the high output fistula had no effect on the patient's hemodynamics. Given the age of the patient's DDRT and prior failed access attempt on the contralateral extremity, we opted to band the fistula in an attempt to preserve access rather than ligate the fistula at this time. An 0-proline suture was looped around the fistula twice and tied down to make the proximal portion of the fistula 5mm.  This was adjacent to anastomosis. The thrill in the distal fistula was lessened and the radial pulse became more pronounced after this maneuver.    We close the skin with 3-0 vicryl deep dermal sutures and a 4-0 monocryl running subcuticular suture. The wound was dressed with dermabond and 4x4 gauze, wrapped in a kerlix and non-compressive ACE bandage. The patient awoke from anesthesia and was taken to the PACU in stable fashion.     Teaching Surgeon Attestation: Dr. Norma Fredrickson was present for all key components of the procedure. All counts correct.  Theo Dills, MD  General Surgery  PGY-3  Pager #: [phone]

## 2018-10-21 NOTE — Unmapped (Signed)
Pt is A&Ox4, afebrile, HTN, NSR. Lung sound clear. Swelling on RUE and slight swelling BLE. Skin on BLE is very hard. Plenty urine output. Ambulate independently. NPO tonight for OR tomorrow.   Problem: Fall Injury Risk  Goal: Absence of Fall and Fall-Related Injury  Outcome: Progressing   No fall during the shift.  Problem: Fluid Volume Excess  Goal: Fluid Balance  Outcome: Progressing   Edema present, but improved significantly per patient.  Problem: Heart Failure Comorbidity  Goal: Maintenance of Heart Failure Symptom Control  Outcome: Progressing   Pt doesn't have symptom of SOB. Edema is improving. Perfusion is normal.

## 2018-10-21 NOTE — Unmapped (Signed)
Physician Discharge Summary [facility]  [address] [facility]  [address]  Agency [place] [address]  Dept: [phone]  Loc: [phone]     Identifying Information:   Miguel Ward  29-Jan-1951  [id number]    Primary Care Physician: Drinda Butts, MD   Code Status: DNR and DNI    Admit Date: 10/17/2018    Discharge Date: 10/21/2018     Discharge To: Home    Discharge Service: MDB - Nephrology Admit     Discharge Attending Physician: Griffin Dakin, MD    Discharge Diagnoses:  Principal Problem:    Acute on chronic HFrEF (heart failure with reduced ejection fraction) (CMS-HCC)  Active Problems:    Hypercholesterolemia    Hypertension    History of kidney transplant    Renal cell carcinoma (CMS-HCC)    Normocytic anemia    Gout    GERD (gastroesophageal reflux disease)  Resolved Problems:    * No resolved hospital problems. *      Outpatient Provider Follow Up Issues:   [ ]  Repeat ultrasound ordered for next week to assess flow through fistula  - Home enalapril held on discharge  - Tacrolimus dose changed from 3 qAM / 3 qPM to 3 qAM / 2 qPM  - Lasix dose increased to 80 BID (prior to admission dose was 40 BID)  - Instructed to obtain standing transplant labs on 11/11    Hospital Course:   Miguel Ward is a 67 y.o. male w/ PMHx of ESRD 2/2 lupus nephritis (s/p DDKT??on 06/11/2007, RCC (s/p R renal nephrectomy (02/20/2014)), CAD, HTN,??prior??pericardial effusion, hypothyroidism, and gout??who presented to [facility] with dyspnea secondary to acute on chronic systolic heart failure. He was found to have 12 L/min flow on a fistula study and underwent fistula banding to prevent worsening of cardiac function from high output heart failure.     Acute on chronic systolic heart failure: Presented with worsening dyspnea, orthopnea, edema, and 10lb weight gain over the past few weeks. Admission pro-BNP elevated at > 24,00 0. He was diuresed with Lasix stabilized on a regimen of 80 mg BID with dry weight of 176 lbs. Echo was obtained and was stable. PVLs were performed to investigate high fistula blood flow as a cause of heart failure exacerbation. PVLs showed increased flow up to 12 L/min. Transplant surgery performed fistula banding on 11/8. The patient was discharged home on lasix 80 BID.   ??  Acute on Chronic Kidney Injury in patient w/??h/o kidney transplant: Follows with Dr. Posey Boyer True. 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 Cr 3 on admission, but improved to baseline 2.3-2.8 with diuresis. Transplant renal US only notable for ascites. The patient was continued on home regimen of calcitriol, Mycophenolate, prednisone, sodium bicarbonate, and acyclovir. Tacrolimus dose was changed from 3 BID to 3 qAM and 2 qPM on day of discharge. Enalapril was held on admission and also held on discharge.   ??  Chronic Stable Medical Conditions:  CAD s/p stent (2010)/HTN:??The patient's home enalapril was held due to AKI. Continued on home metoprolol. Troponin elevated at 0.09 on admission and peaked at 0.098 secondary to demand ischemia in setting of heart failure exacerbation.   HLD:  Continued on home atorvastatin.  Normocytic anemia: Continued on home ferrous sulfate  Hypothyroidism: Continued on home levothyroxine.  GERD: Continued on home Protonix  Gout: Continued on home Allopurinol    Touchbase with Outpatient Provider:  Warm Handoff: Completed on 10/21/18 by Dr. Toni Arthurs with Philis Nettle (transplant coordinator)  (Attending) via Phone    Procedures:  Fistula banding   ______________________________________________________________________  Discharge Medications:     Your Medication List      STOP taking these medications    enalapril 10 MG tablet  Commonly known as:  VASOTEC     sodium bicarbonate 650 mg tablet        CHANGE how you take these medications    ferrous sulfate 325 (65 FE) MG tablet  Take 1 tablet (325 mg total) by mouth daily.  What changed:  when to take this     furosemide 40 MG tablet  Commonly known as:  LASIX  Take 2 tablets (80 mg total) by mouth Two (2) times a day.  What changed:  how much to take     PROGRAF 1 MG capsule  Generic drug:  tacrolimus  TAKE 3 CAPSULES (3MG ) BY MOUTH IN THE MORNING AND 2 CAPSULES (2MG ) BY MOUTH IN THE EVENING.  What changed:  additional instructions        CONTINUE taking these medications    acyclovir 400 MG tablet  Commonly known as:  ZOVIRAX  TAKE (1) TABLET BY MOUTH EVERY DAY     allopurinol 100 MG tablet  Commonly known as:  ZYLOPRIM  Take 1.5 tablets (150 mg total) by mouth daily.     ASPIRIN LOW-STRENGTH 81 MG chewable tablet  Generic drug:  aspirin  Chew 81 mg daily.     atorvastatin 20 MG tablet  Commonly known as:  LIPITOR  Take 1 tablet (20 mg total) by mouth daily.     calcitriol 0.25 MCG capsule  Commonly known as:  ROCALTROL  Take 1 capsule (0.25 mcg total) by mouth Every Monday, Wednesday, and Friday.     CELLCEPT 250 mg capsule  Generic drug:  mycophenolate  TAKE 1 CAPSULE BY MOUTH TWICE DAILY     colchicine 0.6 mg tablet  Commonly known as:  COLCRYS  Take 1 tablet (0.6 mg total) by mouth daily as needed. for up to 1 dose     fluticasone propionate 50 mcg/actuation nasal spray  Commonly known as:  FLONASE  1 spray into each nostril daily as needed.     hydrocortisone 2.5 % ointment  Only when needed in front of ear; can used more broadly on lower legs if needed for itching.     levothyroxine 25 MCG tablet  Commonly known as:  SYNTHROID, LEVOTHROID  Take 2 tabs with one tab, total dose daily     levothyroxine 200 MCG tablet  Commonly known as:  SYNTHROID  Take 1 tab with two tab, total dose daily     magnesium oxide 400 mg (241.3 mg magnesium) tablet  Commonly known as:  MAG-OX  Take 1 tablet (400 mg total) by mouth Two (2) times a day.     metoprolol succinate 100 MG 24 hr tablet  Commonly known as:  TOPROL-XL  Take 1 tablet (100 mg total) by mouth daily.     omeprazole 20 MG capsule  Commonly known as: PriLOSEC  Take 1 capsule (20 mg total) by mouth daily.     predniSONE 5 MG tablet  Commonly known as:  DELTASONE  Take 1 tablet (5 mg total) by mouth daily.     simethicone 80 MG chewable tablet  Commonly known as:  GAS-X  Chew 1 tablet (80 mg total) every six (6) hours  as needed for flatulence.     triamcinolone 0.1 % ointment  Commonly known as:  KENALOG  Apply topically Two (2) times a day. For only 2 week BID topically from itchy area in front of ears.            Allergies:  Penicillins  ______________________________________________________________________  Pending Test Results (if blank, then none):      Most Recent Labs:  All lab results last 24 hours -   Recent Results (from the past 24 hour(s))   Renal Function Panel    Collection Time: 10/21/18  6:36 AM   Result Value Ref Range    Sodium 140 135 - 145 mmol/L    Potassium 4.6 3.5 - 5.0 mmol/L    Chloride 99 98 - 107 mmol/L    CO2 31.0 (H) 22.0 - 30.0 mmol/L    Anion Gap 10 7 - 15 mmol/L    BUN 62 (H) 7 - 21 mg/dL    Creatinine 1.61 (H) 0.70 - 1.30 mg/dL    BUN/Creatinine Ratio 23     EGFR CKD-EPI Non-African American, Male 24 (L) >=60 mL/min/1.88m2    EGFR CKD-EPI African American, Male 28 (L) >=60 mL/min/1.47m2    Glucose 93 65 - 99 mg/dL    Calcium 09.6 8.5 - 04.5 mg/dL    Phosphorus 3.3 2.9 - 4.7 mg/dL    Albumin 3.6 3.5 - 5.0 g/dL   Tacrolimus Level, Trough    Collection Time: 10/21/18  6:36 AM   Result Value Ref Range    Tacrolimus, Trough 11.1 5.0 - 15.0 ng/mL   Type and Screen    Collection Time: 10/21/18  6:36 AM   Result Value Ref Range    Blood Type O POS     Select Screen 3 NEG        Relevant Studies/Radiology (if blank, then none):  US Renal Transplant W Doppler    Result Date: 10/17/2018  EXAM: US RENAL TRANSPLANT W DOPPLER DATE: 10/17/2018 3:38 PM ACCESSION: [id number] UN DICTATED: 10/17/2018 3:39 PM INTERPRETATION LOCATION: [facility] CLINICAL INDICATION: 67 years old Male with eval for transplant blood flow COMPARISON: 12/30/2017 TECHNIQUE: Ultrasound views of the renal transplant were obtained using gray scale and color and spectral Doppler imaging. Views of the urinary bladder were obtained using gray scale imaging. FINDINGS: NATIVE KIDNEYS: Status post left nephrectomy. Right native kidney is not well-visualized. TRANSPLANTED KIDNEY: The renal transplant was located in the right lower quadrant. Normal size and echogenicity.  No solid masses or calculi. No perinephric collections identified. No hydronephrosis. 2.8 cm parapelvic cyst, unchanged. VESSELS: - Perfusion: Using power Doppler, normal perfusion was seen throughout the renal parenchyma. - Resistive indices in the renal transplant are stable compared with prior examination. - Main renal artery/iliac artery: Patent - Main renal vein/iliac vein: Patent BLADDER: Unremarkable. OTHER: Moderate ascites. Incidental note is made of layering sludge within the gallbladder.     Resistive indices similar compared with previous examination in the kidney. Resistive indices in the main renal artery within normal limits, slightly decreased compared with previous examination. Large ascites Please see below for data measurements: Arcuate artery superior resistive index: 0.80(0.82) Arcuate artery mid resistive index: 0.69(0.84) Arcuate artery inferior resistive index: 0.78(0.84) Segmental artery superior resistive index: 0.82(0.84) Segmental artery mid resistive index: 0.73(0.83) Segmental artery inferior resistive index: 0.81(0.84) Main renal artery hilum resistive index: 0.82 Main renal artery mid resistive index: 0.81 Main renal artery anastomosis resistive index: 0.73 Previous resistive indices range of main renal artery: 0.85-1.0  Main renal vein: patent Iliac artery: Patent Iliac vein: Patent       Pvl Venous Duplex Upper Extremity Bilateral    Result Date: 10/19/2018   Peripheral Vascular Lab     [address]   [place], [place] [address]  PVL VENOUS DUPLEX UPPER EXTREMITY BILATERAL Patient Demographics Pt. Name: ZAKYE BABY Location: PVL Inpatient Lab MRN:      [id number]        Sex:      M DOB:      Mar 03, 1951     Age:      58 years  Study Information Authorizing         [id number] Alysia Penna        Performed Time       10/18/2018 5:57:56 Provider Name       [facility]                                   PM Ordering Physician  Donah Driver     Patient Location     [facility] Accession Number    [id number] UN         Technologist         Neita Garnet RVT Diagnosis:                                Assisting                                           Technologist Ordered Reason For Exam: Asymmetric UE edema Other Indication: RUE swelling Risk Factors: None Identified. Other Factors: (h/o renal transplant).  Examination Protocol The internal jugular, brachiocephalic, subclavian, and axillary veins are routinely assessed bilaterally. The brachial, basilic, and cephalic veins are assessed on the requested side. Spectral and Color Doppler data is the primary method for evaluating the brachiocephalic and subclavian veins. Venous compression is used to evaluate the internal jugular, axillary, and upper arm veins.  Limitations:  Duplex Findings Right No abnormality of venous architecture is observed in the central veins. All Doppler signals are appropriately pulsatile with ventilatory excursions. Color Doppler notes appropriate filling in all vessels. The arm veins appear fully compressible. Right arteriovenous fistula appeared patent. Left No abnormality of venous architecture is observed in the central veins. All Doppler signals are appropriately pulsatile with ventilatory excursions. Color Doppler notes appropriate filling in all vessels. The cephalic vein at antecubital fossa is partially compressible appearing rigid with compression and brightly echogenic. All other venous findings in the upper extremity are within normal limits.  Summary of Findings Right No evidence of obstruction was seen in the central veins or arm veins. Right arteriovenous fistula appeared patent. Left No evidence of obstruction was seen in the central veins. Evidence of chronic obstruction visualized in the cephalic vein at antecubital fossa. All other venous findings in the upper extremity are within normal limits.  Final Interpretation Right No evidence of DVT detected in the central veins or arm veins. Patent AV fistula. Left No evidence of DVT detected in the central veins. Abnormalities consistent with the sequela of a prior venous obstructive process, with findings that appear chronic/long standing in nature are identified in the cephalic vein.  Electronically signed by [id number] Jodell Cipro MD on  10/19/2018 at 9:31:39 AM.   Final     Pvl Hemodialysis Graft/fistula Duplex    Result Date: 10/19/2018    Peripheral Vascular Lab     [address]   [place], [place] [address]  PVL HEMODIALYSIS GRAFT-FISTULA DUPLEX Patient Demographics Pt. Name: LOIC HOBIN Location: PVL Inpatient Lab MRN:      [id number]        Sex:      M DOB:      1951-05-15     Age:      88 years  Study Information Authorizing         [id number] Alysia Penna        Performed Time       10/19/2018 1:17:38 Provider Name       [facility]                                   PM Ordering Physician  Teressa Senter          Patient Location     [facility] Accession Number    [id number] UN         Technologist         Coralee Rud                                                                RVT Diagnosis:                                Assisting                                           Technologist Ordered Reason For Exam: Concern for high output heart failure from high flow through AVF  Reason For Exam = possilbe high output heart failure. Access site = Right Upper Extremity. Access Type = radial-cephalic AVF. H/o radiocephalic AVF placed 10+ years ago. h/o kidney transplant 2008.  Examination Protocol: The dialysis access is interrogated using Doppler ultrasound. B-mode imaging and color Doppler are used to evaluate for diameters, depths, velocities, flow volumes, and abnormalities.When arterial steal is suspected by ordering physician, PW Doppler signals are evaluated at the wrist and PPG tracings are obtained from the digits. In the presence of retrograde flow at the wrist or nonpulsatile PPG tracings, dialysis access may be momentarily occluded to evaluate hemodynamic changes. In the presence of inadaquate fistula maturation, the afferant arteries and efferent veins are evaluated for obstruction. In the presence of suspected venous hypertension, the central veins are evaluated for stenosis.  Findings: +----------------------------+--------+-------+--------+------------+ -Brachio-cephalic/basilic AVF-Diameter-Depth  -PSV     -Flow Volume - +----------------------------+--------+-------+--------+------------+ -Distal Brachial Artery      -16.7 mm -11.7 mm-316 cm/s-2613 ml/min - +----------------------------+--------+-------+--------+------------+ -AVF anastomosis             -5.8 mm  -7.0 mm -494 cm/s-            - +----------------------------+--------+-------+--------+------------+ -2 cm proximal to anastomosis-17.0 mm -2.8 mm -261 cm/s-7057 ml/min - -(outflow vein)              -        -       -        -            - +----------------------------+--------+-------+--------+------------+ -  4 cm proximal to anastomosis-26.5 mm -0.7 mm -118 cm/s-12405 ml/min- -(outflow vein)              -        -       -        -            - +----------------------------+--------+-------+--------+------------+ -8 cm proximal to anastomosis-34.5 mm -0.4 mm -132 cm/s-9802 ml/min - -(outflow vein)              -        -       -        -            - +----------------------------+--------+-------+--------+------------+  The AVF appears patent. Multiple areas of dilatation noted.  Final Interpretation Patent AVF with multiple layers of dilatation.       ______________________________________________________________________  Discharge Instructions:   Activity Instructions     Activity as tolerated                    Follow Up instructions and Outpatient Referrals     Call MD for:  difficulty breathing, headache or visual disturbances      Call MD for:  extreme fatigue      Call MD for:  persistent nausea or vomiting      Call MD for: Temperature > 38.5 Celsius ( > 101.3 Fahrenheit)      Discharge instructions      You were admitted to [facility] with acute on chronic heart failure. We gave you lasix through the IV and your symptoms improved. Your heart function looked stable on the echocardiogram.      Please take your medications as prescribed. Medication changes are listed below and a full list of medications is in this discharge packet. Please keep your follow-up appointments after the hospital for ongoing care. It has been a pleasure taking care of you, we wish you the best.     MEDICATION CHANGES:  -- Increase lasix from 40 twice a day to 80 twice a day  -- New tacrolimus dose: 3 mg in the morning, 2 mg in the evening   -- Discontinue enalapril for now    FOLLOW-UP:  -- Please get standing transplant labs on 11/11  -- Attend cardiology appointment as previously scheduled  -- Follow up fistula study ordered on discharge which will be done next week  -- We will arrange for you to have follow up with transplant surgery to assess the fistula (around 2 weeks from now)               Appointments which have been scheduled for you    Oct 27, 2018  3:30 PM EST  (Arrive by 3:15 PM)  Danelle Earthly RETURN with Vernona Rieger, MD  [facility] HEART VASCULAR CTR [address]ARD[address] [place] [facility] REGION) [address]  [address]  [place] [address]  [phone]      Nov 22, 2018 11:30 AM EST  (Arrive by 11:15 AM)  RETURN NEPHROLOGY POST with Brayton Caves, MD  [facility] KIDNEY TRANSPLANT [address] [place] [facility] REGION) [address]   [place] [address]  [phone] ______________________________________________________________________  Discharge Day Services:  BP 177/87  - Pulse 57  - Temp 36.4 ??C (Oral)  - Resp 16  - Ht 182.9 cm (6')  - Wt 80 kg (176 lb 7.7 oz)  -  SpO2 94%  - BMI 23.93 kg/m??   Pt seen on the day of discharge and determined appropriate for discharge.    Condition at Discharge: stable    Length of Discharge: I spent greater than 30 mins in the discharge of this patient.

## 2018-10-21 NOTE — Unmapped (Signed)
Inpatient Surgery Update      PRE-OP DOCUMENTATION: PROVIDER CERTIFICATION    I certify that the appropriate documentation of the patient's evaluation, assessment and treatment plan is found within the medical record and that the patientâ€™s condition is unchanged from this earlier assessment.

## 2018-10-21 NOTE — Unmapped (Signed)
Patient A&Ox4, VSS. Patient denied SOB, chest pain, N/V/D and weakness. Patient remained free from falls, call bell at bedside. Patient NPO at midnight for OR this morning. UOP of overnight. Will continue to monitor.   Problem: Adult Inpatient Plan of Care  Goal: Plan of Care Review  Outcome: Progressing  Goal: Patient-Specific Goal (Individualization)  Outcome: Progressing  Goal: Absence of Hospital-Acquired Illness or Injury  Outcome: Progressing  Goal: Optimal Comfort and Wellbeing  Outcome: Progressing  Goal: Readiness for Transition of Care  Outcome: Progressing  Goal: Rounds/Family Conference  Outcome: Progressing     Problem: Adjustment to Illness (Heart Failure)  Goal: Optimal Coping  Outcome: Progressing     Problem: Cardiac Output Decreased (Heart Failure)  Goal: Optimal Cardiac Output  Outcome: Progressing     Problem: Fluid Imbalance (Heart Failure)  Goal: Fluid Balance  Outcome: Progressing     Problem: Respiratory Compromise (Heart Failure)  Goal: Effective Oxygenation and Ventilation  Outcome: Progressing     Problem: Fall Injury Risk  Goal: Absence of Fall and Fall-Related Injury  Outcome: Progressing     Problem: Fluid Volume Excess  Goal: Fluid Balance  Outcome: Progressing     Problem: Hypertension Acute  Goal: Blood Pressure Within Desired Range  Outcome: Progressing     Problem: Heart Failure Comorbidity  Goal: Maintenance of Heart Failure Symptom Control  Outcome: Progressing     Problem: Hypertension Comorbidity  Goal: Blood Pressure in Desired Range  Outcome: Progressing

## 2018-10-24 NOTE — Unmapped (Signed)
Helayne Seminole from [place] [facility] contacted the clinic to notify that the patient hs lost 10.2 pounds in the past two days. His current weight is 163 pounds .

## 2018-10-24 NOTE — Unmapped (Signed)
Addendum  created 10/24/18 1253 by Ky Barban, MD    Clinical Note Signed, Intraprocedure Event edited

## 2018-10-25 ENCOUNTER — Other Ambulatory Visit
Admission: RE | Admit: 2018-10-25 | Discharge: 2018-10-25 | Disposition: A | Discharge status: Home / Self Care | Payer: Medicare Other | Source: Ambulatory Visit | Attending: Nephrology | Admitting: Nephrology

## 2018-10-25 ENCOUNTER — Encounter: Admit: 2018-10-25 | Discharge: 2018-10-25 | Payer: MEDICARE | Attending: Nephrology | Primary: Nephrology

## 2018-10-25 DIAGNOSIS — X58XXXA Exposure to other specified factors, initial encounter: Secondary | ICD-10-CM | POA: Insufficient documentation

## 2018-10-25 DIAGNOSIS — Z789 Other specified health status: Secondary | ICD-10-CM | POA: Insufficient documentation

## 2018-10-25 DIAGNOSIS — B259 Cytomegaloviral disease, unspecified: Secondary | ICD-10-CM | POA: Insufficient documentation

## 2018-10-25 DIAGNOSIS — T8610 Unspecified complication of kidney transplant: Secondary | ICD-10-CM | POA: Insufficient documentation

## 2018-10-25 DIAGNOSIS — E1129 Type 2 diabetes mellitus with other diabetic kidney complication: Secondary | ICD-10-CM | POA: Insufficient documentation

## 2018-10-25 DIAGNOSIS — N390 Urinary tract infection, site not specified: Secondary | ICD-10-CM | POA: Insufficient documentation

## 2018-10-25 DIAGNOSIS — N189 Chronic kidney disease, unspecified: Secondary | ICD-10-CM | POA: Diagnosis not present

## 2018-10-25 DIAGNOSIS — Z79899 Other long term (current) drug therapy: Secondary | ICD-10-CM | POA: Diagnosis not present

## 2018-10-25 DIAGNOSIS — D631 Anemia in chronic kidney disease: Secondary | ICD-10-CM | POA: Diagnosis not present

## 2018-10-25 DIAGNOSIS — Z9483 Pancreas transplant status: Secondary | ICD-10-CM | POA: Diagnosis not present

## 2018-10-25 DIAGNOSIS — E559 Vitamin D deficiency, unspecified: Secondary | ICD-10-CM | POA: Diagnosis not present

## 2018-10-25 DIAGNOSIS — Z114 Encounter for screening for human immunodeficiency virus [HIV]: Secondary | ICD-10-CM | POA: Diagnosis not present

## 2018-10-25 DIAGNOSIS — B9689 Other specified bacterial agents as the cause of diseases classified elsewhere: Secondary | ICD-10-CM | POA: Insufficient documentation

## 2018-10-25 DIAGNOSIS — D899 Disorder involving the immune mechanism, unspecified: Secondary | ICD-10-CM | POA: Insufficient documentation

## 2018-10-25 DIAGNOSIS — Z09 Encounter for follow-up examination after completed treatment for conditions other than malignant neoplasm: Secondary | ICD-10-CM | POA: Insufficient documentation

## 2018-10-25 LAB — MAGNESIUM: MAGNESIUM: 1.6 mg/dL — AB (ref 1.7–2.4)

## 2018-10-25 LAB — BASIC METABOLIC PANEL
ANION GAP: 14 (ref 5–15)
BUN: 63 mg/dL — AB (ref 8–23)
CHLORIDE: 97 mmol/L — AB (ref 98–111)
CO2: 30 mmol/L (ref 22–32)
Calcium: 9.9 mg/dL (ref 8.9–10.3)
Creatinine, Ser: 2.72 mg/dL — ABNORMAL HIGH (ref 0.61–1.24)
GFR calc Af Amer: 27 mL/min — ABNORMAL LOW (ref >60)
GFR, EST NON AFRICAN AMERICAN: 23 mL/min — AB (ref >60)
Glucose, Bld: 128 mg/dL — ABNORMAL HIGH (ref 70–99)
POTASSIUM: 3.9 mmol/L (ref 3.5–5.1)
Sodium: 141 mmol/L (ref 135–145)

## 2018-10-25 LAB — CBC WITH DIFFERENTIAL/PLATELET
Abs Immature Granulocytes: 0.01 10*3/uL (ref 0.00–0.07)
BASOS ABS: 0 10*3/uL (ref 0.0–0.1)
BASOS PCT: 0 %
EOS PCT: 3 %
Eosinophils Absolute: 0.1 10*3/uL (ref 0.0–0.5)
HCT: 39.3 % (ref 39.0–52.0)
Hemoglobin: 11.9 g/dL — ABNORMAL LOW (ref 13.0–17.0)
IMMATURE GRANULOCYTES: 0 %
LYMPHS ABS: 1.1 10*3/uL (ref 0.7–4.0)
Lymphocytes Relative: 23 %
MCH: 28.5 pg (ref 26.0–34.0)
MCHC: 30.3 g/dL (ref 30.0–36.0)
MCV: 94 fL (ref 80.0–100.0)
Monocytes Absolute: 0.6 10*3/uL (ref 0.1–1.0)
Monocytes Relative: 13 %
NEUTROS PCT: 61 %
Neutro Abs: 3 10*3/uL (ref 1.7–7.7)
PLATELETS: 114 10*3/uL — AB (ref 150–400)
RBC: 4.18 MIL/uL — AB (ref 4.22–5.81)
RDW: 15.8 % — ABNORMAL HIGH (ref 11.5–15.5)
WBC: 5 10*3/uL (ref 4.0–10.5)
nRBC: 0 % (ref 0.0–0.2)

## 2018-10-25 LAB — PHOSPHORUS: PHOSPHORUS: 2.8 mg/dL (ref 2.5–4.6)

## 2018-10-26 MED ORDER — PROGRAF 1 MG CAPSULE
ORAL_CAPSULE | 11 refills | 0 days | Status: CP
Start: 2018-10-26 — End: 2018-10-27

## 2018-10-26 NOTE — Unmapped (Signed)
[facility] Refill Coordination Note    Specialty Medication(s) to be Shipped:   Transplant: Prograf 1mg     Other medication(s) to be shipped: none    Arneta Cliche MD on Prograf 1mg      Miguel Ward, DOB: 1951/04/15  Phone: [phone] (home)       All above HIPAA information was verified with patient.     Completed refill call assessment today to schedule patient's medication shipment from the [facility] [phone]).       Specialty medication(s) and dose(s) confirmed: Patient reports changes to the regimen as follows: PT reported dose change to 5mg  Daily.   Changes to medications: Jayleon reports no changes reported at this time.  Changes to insurance: No  Questions for the pharmacist: No    The patient will receive a drug information handout for each medication shipped and additional FDA Medication Guides as required.      DISEASE/MEDICATION-SPECIFIC INFORMATION        N/A    ADHERENCE     Medication Adherence    Patient reported X missed doses in the last month:  0      Adherence tools used:  patient uses a pill box to manage medications, calendar                      MEDICARE PART B DOCUMENTATION     Prograf 1mg : Patient has 8 days worth of capsules on hand.    SHIPPING     Shipping address confirmed in Epic.     Delivery Scheduled: Yes, Expected medication delivery date: 10/28/18 via UPS or courier.     Medication will be delivered via UPS to the home address in Epic WAM.    Miguel Ward   [facility] Specialty Technician

## 2018-10-27 ENCOUNTER — Ambulatory Visit: Admit: 2018-10-27 | Discharge: 2018-10-28 | Payer: MEDICARE | Attending: Internal Medicine | Primary: Internal Medicine

## 2018-10-27 DIAGNOSIS — Z940 Kidney transplant status: Principal | ICD-10-CM

## 2018-10-27 DIAGNOSIS — I10 Essential (primary) hypertension: Secondary | ICD-10-CM

## 2018-10-27 DIAGNOSIS — E7800 Pure hypercholesterolemia, unspecified: Secondary | ICD-10-CM

## 2018-10-27 DIAGNOSIS — I5022 Chronic systolic (congestive) heart failure: Principal | ICD-10-CM

## 2018-10-27 DIAGNOSIS — I2510 Atherosclerotic heart disease of native coronary artery without angina pectoris: Secondary | ICD-10-CM

## 2018-10-27 DIAGNOSIS — I5032 Chronic diastolic (congestive) heart failure: Secondary | ICD-10-CM

## 2018-10-27 DIAGNOSIS — Z79899 Other long term (current) drug therapy: Secondary | ICD-10-CM

## 2018-10-27 MED ORDER — PROGRAF 1 MG CAPSULE
ORAL_CAPSULE | 11 refills | 0 days
Start: 2018-10-27 — End: 2018-11-15

## 2018-10-27 NOTE — Unmapped (Signed)
Updated medication list

## 2018-10-28 LAB — TACROLIMUS, TROUGH: Lab: 7.7

## 2018-10-28 NOTE — Unmapped (Signed)
It was great to see you today! I am glad you are feeling better.  As we discussed, continue your medications as is. No changes were made today.  However, be sure to call me if you notice weight gain of 5 lbs or more, of if you notice worsening shortness of breath, chest pain, or leg swelling.

## 2018-10-28 NOTE — Unmapped (Signed)
Cardiology Clinic Follow-up Note    Referring Provider: True, Posey Boyer, MD   Primary Provider: Drinda Butts, MD     Reason for Visit:  Hospital discharge follow-up.    Assessment & Plan:  Miguel Ward is a 67yo M with h/o kidney transplant 05/2007 for lupus nephritis, hypertension, HLD, and CAD s/p PCI to mLAD who is here for hospital discharge f/u.    #Combined systolic and diastolic HF, chronic  -Most recent TTE from 10/19/18 still shows globally, mildly decreased EF ~45% and grade III diastolic dysfunction. This is compared to prior TTE in 2009 which showed mild LVH and normal systolic and diastolic function. I suspect the etiology of his heart failure is more hypertensive heart disease given his global dysfunction, LVH, and diastolic dysfunction; he also has not had any ischemic symptoms. However, given his CAD hx, ischemia is something to consider.   -He was recently admitted for a CHF exacerbation (as noted below) in the setting of high fistula blood flow, but his repeat TTE at that time was unchanged. He therefore underwent fistula banding, and afterward did very well with IV diuresis. He is currently still on PO lasix 80mg  BID and is clinically doing very well. He is euvolemic on exam with no symptoms of decompensated heart failure. NYHA class I-II.   - Continue toprol XL 100mg  daily. His HR is at goal on this dose, so there is no current need for dose adjustment at this time.  - Enalapril currently is still on hold due to his kidney dysfunction, but ideally I would like to re-start this medication as soon as possible. I appreciate nephrology's input on this.  - Also continue lasix 80mg  BID. I further advised him to weigh himself everyday at the same time in minimal clothing, and if he notices worsening symptoms or increase in weight by 3lbs or more, he should take an extra dose of lasix and call me.   - He also has an appointment with Nephrology soon.    #CAD  -S/p PCI to mLAD in 2009. Since then, he has been doing well, without any symptoms of angina or arrhythmias. However, he does have reduced EF as noted above.  - Continue ASA 81mg  daily, lipitor 20mg  daily, and toprol XL 100mg  daily.  ??  #Hypercholesterolemia  -Last lipid panel in 02/2018 was still at goal: T chol 115/ HDL 52/ LDL 46 / Tg 84.  - Continue lipitor 20mg  daily.  ??  #Hypertension  -At goal.  - Continue medications as noted above.      Follow-up in 2 months.    The patient was seen and discussed with Dr. Willa Rough.        History of Present Illness:  Miguel Ward is a 77yo M with h/o kidney transplant 05/2007 for lupus nephritis, hypertension, HLD, and CAD s/p PCI to mLAD who is here for hospital discharge f/u.    He was last seen in Cardiology clinic in 02/2018 after a recent hospitalization where he was found to have RSV with an AKI (Cr up to 2.7). At that time he was having cold-like symptoms with a wheeze, but also noted new orthopnea and LE edema. Repeat TTE showed EF 40-45% with moderate LVH, grade III diastolic dysfunction, and PA pressure ~69; his trop peaked at 0.271. He was diuresed with IV lasix 40mg  with significant improvement and his pro-BNP decreased from 16000 to 9000. However, since then he was again recently hospitalized earlier this month for dyspnea and symptoms  of volume overload. He was again diuresed with IV lasix until he obtained his dry weight. Repeat TTE was obtained and was unchanged. Interestingly, PVLs were performed to investigate his high fistula blood flow as a cause of heart failure exacerbation. This showed increased flow up to 12 L/min. Transplant surgery therefore performed fistula banding on 10/21/18. Afterward, he continued to do well and was discharged home on PO lasix 80 BID.   Today he reports that he is feeling much better. He is still taking lasix 80mg  BID. His weight is actually down to 160lbs and he reports that he feels like a new person. He is continuing to check his weight everyday and is also avoiding excess salt. He specifically denies any SOB/DOE, orthopnea (sleeping with 2 pillows), PND, or LE edema. He also states that he can still walk up a flight of stairs without issues. He also completely denies any chest pain, sustained palpitations, dizziness, syncope, or N/V.       Cardiovascular History:  ?? Hypertension.  ?? Hypercholesterolemia.  ?? Pericardial effusion 08/2007, with suspected etiologies including hypothyroidism (TSH 258), recent renal transplantation, and SLE.  ?? Coronary artery disease s/p PCI.    Interventions / Surgery:  ?? Emergent pericardiocentesis 08/26/2007.  ?? Cardiac catheterization and PCI for unstable angina 12/10/2008:  Severe stenosis of the mid LAD treated with a drug eluting stent.  90% distal LAD and 70% distal PDA stenoses also noted, with nonobstructive atherosclerosis elsewhere.    Imaging:  ?? Echocardiogram (12/22/2007): LVH with normal systolic function and diastolic dysfunction.  Degenerative mitral valve disease.  Small pericardial effusion.  ?? Pharmacologic stress perfusion scan (12/06/2008): No evidence of ischemia, with a reversible defect of the mid anteroseptal and apical segments.  Normal ejection fraction.    - 02/14/18 TTE:  ?? Ultrasound enhancing agent utilized to improve endocardial border definition  ?? Left ventricular hypertrophy - moderate  ?? Mildly to moderately decreased left ventricular systolic function, ejection fraction 40 to 45%  ?? Diastolic dysfunction - grade III (severely elevated filling pressures)  ?? Degenerative mitral valve disease  ?? Mitral regurgitation - mild  ?? Dilated left atrium - moderate  ?? Dilated ascending aorta  ?? Dilated right ventricle - mild  ?? Normal right ventricular systolic function  ?? Tricuspid regurgitation - mild to moderate  ?? Elevated pulmonary artery systolic pressure - moderate to severe  ?? Dilated right atrium - mild  ?? Elevated right atrial pressure    - 10/19/18 TTE:  ?? Mild left ventricular concentric hypertrophy  ?? Mildly decreased left ventricular systolic function, ejection fraction 45%  ?? Diastolic dysfunction - grade III (severely elevated filling pressures)  ?? Dilated left atrium - severe  ?? Mitral regurgitation - mild  ?? Dilated right ventricle - mild  ?? Low normal right ventricular systolic function  ?? Dilated right atrium - moderate  ?? Tricuspid regurgitation - moderate  ?? Elevated right atrial pressure  ?? Elevated pulmonary artery systolic pressure - moderate to severe    Past Medical & Surgical History:  Gout  Hypothyroidism  RCC s/p right renal nephrectomy (02/20/2014)    Allergies:  Reviewed in EMR.    Pertinent Medications (complete listing reviewed in EMR):  Aspirin 81 mg daily.  Atorvastatin 20 mg daily.   (held for AKI)  Metoprolol succinate 100 mg daily.  Lasix 80mg  BID    Review of Systems:  Review of ten systems is negative or unremarkable except as stated above.    Physical Exam:  VITAL SIGNS:   Vitals:    10/27/18 1544   BP: 140/62   Pulse: 67   SpO2: 95%   Weight: 72.9 kg (160 lb 12.8 oz)   Height: 182.9 cm (6')     Body mass index is 21.81 kg/m??.  GENERAL: Sitting up in chair in NAD, AAOx4, very pleasant.  HEENT: [place]/AT, PERRL, MMM.  NECK: Supple. No JVD.  RESPIRATORY: CTAB; no w/r/r. Normal WOB on room air.  CARDIOVASCULAR: RRR; no m/r/g.  ABDOMEN: Soft, NT/ND; +BS throughout.  EXTREMITIES: No e/c/c.  SKIN: Warm and well perfused.    Pertinent Laboratory Studies:   Lab Results   Component Value Date    PRO-BNP 24,700.0 (H) 10/17/2018    PRO-BNP 23,500.0 (H) 09/21/2018    PRO-BNP 11,800.0 (H) 03/02/2018    Triglycerides 84 03/02/2018    Triglycerides 66 02/17/2017    Triglycerides 80 06/17/2016    Triglycerides 72 01/26/2014    Triglycerides 68 08/29/2010    Triglycerides 93 03/14/2010    HDL 52 03/02/2018    HDL 57 02/17/2017    HDL 46 06/17/2016    HDL 49 01/22/2015    HDL 44 01/26/2014    HDL 58 08/02/2013    Non-HDL Cholesterol 63 03/02/2018    Non-HDL Cholesterol 54 02/17/2017    Non-HDL Cholesterol 68 06/17/2016    LDL Calculated 46 (L) 03/02/2018    LDL Calculated 41 (L) 02/17/2017    LDL Calculated 52 (L) 06/17/2016    LDL Direct 54 03/08/2012    LDL Cholesterol, Calculated 38 01/26/2014    LDL Cholesterol, Calculated 59 08/29/2010    LDL Cholesterol, Calculated 44 03/14/2010    Creatinine Whole Blood, POC 2.0 (H) 04/27/2017    Creatinine Whole Blood, POC 2.5 (H) 09/20/2014    Creatinine 2.66 (H) 10/21/2018    BUN 62 (H) 10/21/2018    BUN 55 (H) 09/29/2018    Potassium 4.6 10/21/2018    Potassium 4.6 01/22/2015    Magnesium 2.0 09/29/2018    Magnesium 1.0 (L) 01/22/2015    WBC 4.0 (L) 10/19/2018    WBC 6.2 09/29/2018    WBC 7.2 01/22/2015    WBC 6.1 12/21/2014    HGB 11.4 (L) 10/19/2018    HGB 10.4 (L) 01/22/2015    HCT 37.7 (L) 10/19/2018    HCT 35.3 (L) 01/22/2015    Platelet 87 (L) 10/19/2018    Platelet 140 (L) 01/22/2015    INR 1.45 10/17/2018    INR 1.39 02/12/2018    INR 1.1 05/23/2014    INR 1.1 02/15/2014

## 2018-10-31 ENCOUNTER — Ambulatory Visit: Admit: 2018-10-31 | Discharge: 2018-11-01 | Payer: MEDICARE

## 2018-10-31 DIAGNOSIS — Z940 Kidney transplant status: Principal | ICD-10-CM

## 2018-10-31 DIAGNOSIS — I5023 Acute on chronic systolic (congestive) heart failure: Secondary | ICD-10-CM

## 2018-11-05 NOTE — Unmapped (Signed)
I saw and evaluated the patient, participating in the key portions of the service and reviewing pertinent diagnostic studies and records. I reviewed the resident’s note and agree with the findings and plan.  - Keary Hanak H Mouhamadou Gittleman, MD, FACC

## 2018-11-15 MED ORDER — PROGRAF 1 MG CAPSULE: capsule | 11 refills | 0 days | Status: AC

## 2018-11-15 MED ORDER — PROGRAF 1 MG CAPSULE
ORAL_CAPSULE | ORAL | 11 refills | 0.00000 days | Status: CP
Start: 2018-11-15 — End: 2018-11-15
  Filled 2018-11-21: qty 150, 30d supply, fill #0

## 2018-11-15 NOTE — Unmapped (Signed)
Patient called asking for prograf prescription to be sent to his local pharmacy.    Prescription sent to [facility].    Called patient and let him know prescription was sent

## 2018-11-16 NOTE — Unmapped (Signed)
[facility] Refill and Clinical Coordination Note  Medication(s): Prograf 1mg     Patient gets [employer] through the mfg.    Miguel Ward, DOB: [birth year]  Phone: [phone] (home) , Alternate phone contact: N/A  Shipping address: [address]  [facility] [place] [address]  Phone or address changes today?: No  All above HIPAA information verified.  Insurance changes? No    Completed refill and clinical call assessment today to schedule patient's medication shipment from the [facility] [phone]).      MEDICATION RECONCILIATION    Confirmed the medication and dosage are correct and have not changed: Yes, regimen is correct and unchanged.    Were there any changes to your medication(s) in the past month:  No, there are no changes reported at this time.    ADHERENCE    Is this medicine transplant or covered by Medicare Part B? Yes.    Prograf 1mg : Patient has 6 days worth of  capsules on hand.    Did you miss any doses in the past 4 weeks? No missed doses reported.  Adherence counseling provided? Not needed     SIDE EFFECT MANAGEMENT    Are you tolerating your medication?:  Miguel Ward reports tolerating the medication.  Side effect management discussed: None      Therapy is appropriate and should be continued.    Evidence of clinical benefit: See Epic note from 09/21/18      FINANCIAL/SHIPPING    Delivery Scheduled: Yes, Expected medication delivery date: 11/18/18     Medication will be delivered via UPS to the home address in [facility].    Additional medications refilled: No additional medications/refills needed at this time.    The patient will receive a drug information handout for each medication shipped and additional FDA Medication Guides as required.      Reford did not have any additional questions at this time.    Delivery address confirmed in Epic.     We will follow up with patient monthly for standard refill processing and delivery.      Thank you,  [name]   [facility] Specialty Pharmacist

## 2018-11-17 ENCOUNTER — Ambulatory Visit: Admit: 2018-11-17 | Discharge: 2018-11-17 | Payer: MEDICARE

## 2018-11-17 ENCOUNTER — Ambulatory Visit: Admit: 2018-11-17 | Discharge: 2018-11-17 | Payer: MEDICARE | Attending: Internal Medicine | Primary: Internal Medicine

## 2018-11-17 ENCOUNTER — Ambulatory Visit: Admit: 2018-11-17 | Discharge: 2018-11-17 | Payer: MEDICARE | Attending: Surgery | Primary: Surgery

## 2018-11-17 DIAGNOSIS — I5022 Chronic systolic (congestive) heart failure: Secondary | ICD-10-CM

## 2018-11-17 DIAGNOSIS — I5032 Chronic diastolic (congestive) heart failure: Secondary | ICD-10-CM

## 2018-11-17 DIAGNOSIS — R0600 Dyspnea, unspecified: Principal | ICD-10-CM

## 2018-11-17 DIAGNOSIS — R601 Generalized edema: Secondary | ICD-10-CM

## 2018-11-17 DIAGNOSIS — E7800 Pure hypercholesterolemia, unspecified: Secondary | ICD-10-CM

## 2018-11-17 DIAGNOSIS — I2510 Atherosclerotic heart disease of native coronary artery without angina pectoris: Secondary | ICD-10-CM

## 2018-11-17 DIAGNOSIS — I4891 Unspecified atrial fibrillation: Principal | ICD-10-CM

## 2018-11-17 DIAGNOSIS — I10 Essential (primary) hypertension: Secondary | ICD-10-CM

## 2018-11-17 NOTE — Unmapped (Signed)
Vascular Access for Dialysis Note:  Miguel Ward    Saw Dr Lucianne Muss and Dr Norma Fredrickson fistula okay from their perspective as long as heart is okay; pt sees Dr Kirtland Bouchard True next week; if she has other opinion we will f/u. Ordered EKG during clinic visit due to reported irregular heart rate; a-fibrillation on ECG. HR 58. No previously reported atrial fibrillation. Pt saw Dr Danne Harbor, Cardiologist, at [facility] on 10/27/18. Will refer pt to Dr. Willa Rough for follow up.     D/c from clinic, asymptomatic, ambulating, self.     Called Dr Willa Rough' office [phone] and they were able to schedule him for 4:20 pm today, 11/17/18. Called patient and he says he can make that appointment. He had no further questions or concerns.

## 2018-11-17 NOTE — Unmapped (Signed)
TRANSPLANT SURGERY PROGRESS NOTE    Assessment and Plan  Miguel Ward is a 67 y.o. male w/ PMHx of ESRD 2/2 lupus nephritis (s/p DDKT??on 06/11/2007, RCC (s/p R renal nephrectomy (02/20/2014)), CAD, HTN,??prior??pericardial effusion, hypothyroidism, and gout??who presented to [facility] with dyspnea and anasarca concerning for heart failure exacerbation.   He is a k/c/o CAD s/p PCI in  2009. He was admitted with Fluid overloads/t High output heart failure. He was found to have a hight output RUE AVF which had a flow of 12 L/min across it. Patient was diuresed by nephrology and consulted [place] for the high output fistula. Patient underwent RUE AVF banding on 10/21/2018.     - Plan to follow up with nephrologist to evaluate his current status after fistula banding and the need for a total ligation in case if high output heart failure symptoms worsen.  - Patient was found to have an irregularly irregular pulse and on EKG was seen to have Atrial Flutter. Advised to visit his cardiologist for an opinion.     ??    Subjective  Miguel Ward is a 67 y.o. 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  Patient underwent RUE AVF banding on 10/21/2018.   He is asymptomatic at present. He denies any chest pain, SOB, fever, pain, weakness, tingling, numbness. He has lost weight after putting out fluids by diuresing.    Objective    Vitals:    11/17/18 1043   BP: 143/80   Pulse: 96   Temp: 36 ??C (96.8 ??F)   TempSrc: Tympanic   Weight: 69.9 kg (154 lb 3.2 oz)   Height: 182.9 cm (6' 0.01)      Body mass index is 20.91 kg/m??.     Physical Exam:    General Appearance:   No acute distress  Lungs:                Clear to auscultation bilaterally  Heart:                           Regular rate and rhythm  Abdomen:                Soft, non-tender, non-distended  Extremities:              RUE: incision has healed, no redness or discharge, the size                           of the dilated fistula has decreased, flow through the fistula has                                  decreased, thrill palpable, radial pulse 2+, irregularly irregular radial pulse        Data Review:  All lab results last 24 hours:  No results found for this or any previous visit (from the past 24 hour(s)).      EKG dated 11/17/2018 s/o Atrial Flutter

## 2018-11-17 NOTE — Unmapped (Signed)
Cardiology Clinic Follow-up Note    Referring Provider: True, Posey Boyer, MD   Primary Provider: Drinda Butts, MD     Reason for Visit:  Newly recognized afib.    Assessment & Plan:  Miguel Ward is a 67yo M with h/o kidney transplant in 05/2007 for lupus nephritis, hypertension, HLD, chronic combined systolic and diastolic HF, and CAD s/p PCI to mLAD who is here today for an acute visit for newly recognized, asymptomatic afib.    #Atrial fibrillation  -As noted below, the patient does not have a prior known h/o afib but his ECG earlier today was consistent with afib (HR 66). His ECG in clinic now is also consistent with coarse afib (HR 76). Fortunately, he is complelely asymptomatic and hemodynamically stable. He is also rate controlled on his current dose of metoprolol. Given all of his co-morbidities, it is not surprising for him to now have afib as well. He also did have a recent TTE in 10/2018 which was unchanged, but this study was notable for significantly dilated atria.   - We discussed this new diagnosis at length today, including what afib is, what symptoms to look out for, stroke risk, strict ED return precautions, etc. I also gave him some literature on afib to read.  - Since he is already rate controlled on his current dose of metoprolol, we will continue this without change.  - Of note, his CHADsVAsC is 4 (for age, CHF, hypertension, CAD hx), so he technically meets criteria for anticoagulation, especially because it is possible that he has been in afib for longer than just today (I.e. since he is asymptomatic and rate controlled on metop, which makes it more likely for prior afib episodes to go unrecognized). However, he had thrombocytopenia on recent labs (last check with plts in the 80s). Will therefore plan to recheck his CBC and BMP first, which he is going to do at [facility]. Also, given his kidney dysfunction, will also consider starting reduced dose of a DOAC.    #Combined systolic and diastolic HF, chronic  -Most recent TTE from 10/19/18 still shows globally, mildly decreased EF ~45% and grade III diastolic dysfunction. This is compared to prior TTE in 2009 which showed mild LVH and normal systolic and diastolic function. I suspect the etiology of his heart failure is more hypertensive heart disease given his global dysfunction, LVH, and diastolic dysfunction; he also has not had any ischemic symptoms. However, given his CAD hx, ischemia is something to consider.   -He was recently admitted for a CHF exacerbation (as noted below) in the setting of high fistula blood flow, but his repeat TTE at that time was unchanged. He therefore underwent fistula banding, and afterward did very well with IV diuresis. He is currently still on PO lasix 80mg  BID and is clinically doing very well. He is euvolemic on exam with no symptoms of decompensated heart failure. NYHA class I-II.   - Continue toprol XL 100mg  daily. His HR is at goal on this dose, so there is no current need for dose adjustment at this time.  - Enalapril currently is still on hold due to his kidney dysfunction, but ideally I would like to re-start this medication as soon as possible. I appreciate nephrology's input on this.  - Also continue lasix 80mg  BID. I further advised him to weigh himself everyday at the same time in minimal clothing, and if he notices worsening symptoms or increase in weight by 3lbs or more, he should take  an extra dose of lasix and call me.   - He also has an appointment with Nephrology soon.    #CAD  -S/p PCI to mLAD in 2009. Since then, he has been doing well, without any symptoms of angina. However, he does have reduced EF as noted above.  - Continue ASA 81mg  daily, lipitor 20mg  daily, and toprol XL 100mg  daily.  ??  #Hypercholesterolemia  -Last lipid panel in 02/2018 was still at goal: T chol 115/ HDL 52/ LDL 46 / Tg 84.  - Continue lipitor 20mg  daily.  ??  #Hypertension  -At goal.  - Continue medications as noted above. Follow-up in 3 months.    The patient was seen and discussed with Dr. Willa Rough.        History of Present Illness:  Miguel Ward is a 30yo M with h/o kidney transplant in 05/2007 for lupus nephritis, hypertension, HLD, chronic combined systolic and diastolic HF, and CAD s/p PCI to mLAD who is here today for an acute visit for newly recognized, asymptomatic afib.    He was last seen in Cardiology clinic in 10/2018 for hospital discharge follow-up. At that time, he had been hospitalized for dyspnea and symptoms of volume overload. He was diuresed with IV lasix until euvolemic; repeat TTE was unchanged. Interestingly, PVLs were performed to investigate his high fistula blood flow as a cause of heart failure exacerbation. This showed increased flow up to 12 L/min. Transplant surgery therefore performed fistula banding on 10/21/18. Afterward, he continued to do well and was discharged home on PO lasix 80 BID. At last visit, he still reported feeling well and his weight was down to 160lbs. He was therefore continued on his medications without change. Today, he went for a scheduled follow-up appointment in transplant clinic and was incidentally noted to have an irregular pulse and follow-up ECG showed new, rate-controlled afib. He was therefore instructed to come to clinic as soon as possible for further evaluation.  Currently he denies any new symptoms. He says he has been feeling unchanged and very well since last visit. He specifically denies any palpitations, dizziness, fatigue, chest pain, or SOB. He further denies orthopnea (still sleeping with 2 pillows), PND, LE edema, or syncope.      Cardiovascular History:  ?? Hypertension.  ?? Hypercholesterolemia.  ?? Pericardial effusion 08/2007, with suspected etiologies including hypothyroidism (TSH 258), recent renal transplantation, and SLE.  ?? Coronary artery disease s/p PCI.    Interventions / Surgery:  ?? Emergent pericardiocentesis 08/26/2007.  ?? Cardiac catheterization and PCI for unstable angina 12/10/2008:  Severe stenosis of the mid LAD treated with a drug eluting stent.  90% distal LAD and 70% distal PDA stenoses also noted, with nonobstructive atherosclerosis elsewhere.    Imaging:  ?? Echocardiogram (12/22/2007): LVH with normal systolic function and diastolic dysfunction.  Degenerative mitral valve disease.  Small pericardial effusion.  ?? Pharmacologic stress perfusion scan (12/06/2008): No evidence of ischemia, with a reversible defect of the mid anteroseptal and apical segments.  Normal ejection fraction.    - 02/14/18 TTE:  ?? Ultrasound enhancing agent utilized to improve endocardial border definition  ?? Left ventricular hypertrophy - moderate  ?? Mildly to moderately decreased left ventricular systolic function, ejection fraction 40 to 45%  ?? Diastolic dysfunction - grade III (severely elevated filling pressures)  ?? Degenerative mitral valve disease  ?? Mitral regurgitation - mild  ?? Dilated left atrium - moderate  ?? Dilated ascending aorta  ?? Dilated right ventricle - mild  ??  Normal right ventricular systolic function  ?? Tricuspid regurgitation - mild to moderate  ?? Elevated pulmonary artery systolic pressure - moderate to severe  ?? Dilated right atrium - mild  ?? Elevated right atrial pressure    - 10/19/18 TTE:  ?? Mild left ventricular concentric hypertrophy  ?? Mildly decreased left ventricular systolic function, ejection fraction 45%  ?? Diastolic dysfunction - grade III (severely elevated filling pressures)  ?? Dilated left atrium - severe  ?? Mitral regurgitation - mild  ?? Dilated right ventricle - mild  ?? Low normal right ventricular systolic function  ?? Dilated right atrium - moderate  ?? Tricuspid regurgitation - moderate  ?? Elevated right atrial pressure  ?? Elevated pulmonary artery systolic pressure - moderate to severe    - 11/17/18 ECG: Coarse afib, HR 76.    Past Medical & Surgical History:  Gout  Hypothyroidism  RCC s/p right renal nephrectomy (02/20/2014)    Allergies:  Reviewed in EMR.    Pertinent Medications (complete listing reviewed in EMR):  Aspirin 81 mg daily.  Atorvastatin 20 mg daily.   (held for AKI)  Metoprolol succinate 100 mg daily.  Lasix 80mg  BID    Review of Systems:  Review of ten systems is negative or unremarkable except as stated above.    Physical Exam:  VITAL SIGNS:   Vitals:    11/17/18 1615   BP: 140/90   BP Site: L Arm   BP Position: Sitting   BP Cuff Size: Medium   Pulse: 80   Resp: 12   SpO2: 98%   Weight: 70.6 kg (155 lb 11.2 oz)   Height: 182.9 cm (6' 0.01)     Body mass index is 21.11 kg/m??.  GENERAL: Sitting up in chair in NAD, AAOx4, very pleasant.  HEENT: [place]/AT, PERRL, MMM.  NECK: Supple. No JVD.  RESPIRATORY: CTAB; no w/r/r. Normal WOB on room air.  CARDIOVASCULAR: Irregularly irregular, rate controlled; no m/r/g appreciated.  ABDOMEN: Soft, NT/ND; +BS throughout.  EXTREMITIES: No e/c/c.  SKIN: Warm and well perfused.  NEURO: No focal deficits appreciated.    Pertinent Laboratory Studies:   Lab Results   Component Value Date    PRO-BNP 24,700.0 (H) 10/17/2018    PRO-BNP 23,500.0 (H) 09/21/2018    PRO-BNP 11,800.0 (H) 03/02/2018    Triglycerides 84 03/02/2018    Triglycerides 66 02/17/2017    Triglycerides 80 06/17/2016    Triglycerides 72 01/26/2014    Triglycerides 68 08/29/2010    Triglycerides 93 03/14/2010    HDL 52 03/02/2018    HDL 57 02/17/2017    HDL 46 06/17/2016    HDL 49 01/22/2015    HDL 44 01/26/2014    HDL 58 08/02/2013    Non-HDL Cholesterol 63 03/02/2018    Non-HDL Cholesterol 54 02/17/2017    Non-HDL Cholesterol 68 06/17/2016    LDL Calculated 46 (L) 03/02/2018    LDL Calculated 41 (L) 02/17/2017    LDL Calculated 52 (L) 06/17/2016    LDL Direct 54 03/08/2012    LDL Cholesterol, Calculated 38 01/26/2014    LDL Cholesterol, Calculated 59 08/29/2010    LDL Cholesterol, Calculated 44 03/14/2010    Creatinine Whole Blood, POC 2.0 (H) 04/27/2017    Creatinine Whole Blood, POC 2.5 (H) 09/20/2014    Creatinine 2.66 (H) 10/21/2018    BUN 62 (H) 10/21/2018    BUN 55 (H) 09/29/2018    Potassium 4.6 10/21/2018    Potassium 4.6 01/22/2015    Magnesium 2.0  09/29/2018    Magnesium 1.0 (L) 01/22/2015    WBC 4.0 (L) 10/19/2018    WBC 6.2 09/29/2018    WBC 7.2 01/22/2015    WBC 6.1 12/21/2014    HGB 11.4 (L) 10/19/2018    HGB 10.4 (L) 01/22/2015    HCT 37.7 (L) 10/19/2018    HCT 35.3 (L) 01/22/2015    Platelet 87 (L) 10/19/2018    Platelet 140 (L) 01/22/2015    INR 1.45 10/17/2018    INR 1.39 02/12/2018    INR 1.1 05/23/2014    INR 1.1 02/15/2014

## 2018-11-17 NOTE — Unmapped (Signed)
EKG done per order

## 2018-11-17 NOTE — Unmapped (Signed)
[facility] for Dialysis Note:  Miguel Ward  ECG from 11/17/18 11:56 AM transmitted to cardiology for review, scanned into Media tab in Epic, and faxed to [facility] Cardiology Attn: Dr Sarajane Marek where pt has an appt at 4:20 pm today.

## 2018-11-18 NOTE — Unmapped (Signed)
[name] called back about the delivery for Prograf and would like the delivery to ship out 12/9 via UPS or [employer] to be delivered 12/9 . We have confirmed the delivery via [employer] .

## 2018-11-18 NOTE — Unmapped (Addendum)
As we discussed, you have been found to have afib. I have included some more information on afib in this packet.  Continue your metoprolol, which can help keep your afib under control.   Prior to starting a blood thinner, I want to repeat your labs to check your platelets and kidney function.        Patient Education        Atrial Fibrillation: Care Instructions  Your Care Instructions    Atrial fibrillation is an irregular and often fast heartbeat. Treating this condition is important for several reasons. It can cause blood clots, which can travel from your heart to your brain and cause a stroke. If you have a fast heartbeat, you may feel lightheaded, dizzy, and weak. An irregular heartbeat can also increase your risk for heart failure.  Atrial fibrillation is often the result of another heart condition, such as high blood pressure or coronary artery disease. Making changes to improve your heart condition will help you stay healthy and active.  Follow-up care is a key part of your treatment and safety. Be sure to make and go to all appointments, and call your doctor if you are having problems. It's also a good idea to know your test results and keep a list of the medicines you take.  How can you care for yourself at home?  Medicines  ?? ?? Take your medicines exactly as prescribed. Call your doctor if you think you are having a problem with your medicine. You will get more details on the specific medicines your doctor prescribes.   ?? ?? If your doctor has given you a blood thinner to prevent a stroke, be sure you get instructions about how to take your medicine safely. Blood thinners can cause serious bleeding problems.   ?? ?? Do not take any vitamins, over-the-counter drugs, or herbal products without talking to your doctor first.   ??Lifestyle changes  ?? ?? Do not smoke. Smoking can increase your chance of a stroke and heart attack. If you need help quitting, talk to your doctor about stop-smoking programs and medicines. These can increase your chances of quitting for good.   ?? ?? Eat a heart-healthy diet.   ?? ?? Stay at a healthy weight. Lose weight if you need to.   ?? ?? Limit alcohol to 2 drinks a day for men and 1 drink a day for women. Too much alcohol can cause health problems.   ?? ?? Avoid colds and flu. Get a pneumococcal vaccine shot. If you have had one before, ask your doctor whether you need another dose. Get a flu shot every year. If you must be around people with colds or flu, wash your hands often.   Activity  ?? ?? If your doctor recommends it, get more exercise. Walking is a good choice. Bit by bit, increase the amount you walk every day. Try for at least 30 minutes on most days of the week. You also may want to swim, bike, or do other activities. Your doctor may suggest that you join a cardiac rehabilitation program so that you can have help increasing your physical activity safely.   ?? ?? Start light exercise if your doctor says it is okay. Even a small amount will help you get stronger, have more energy, and manage stress. Walking is an easy way to get exercise. Start out by walking a little more than you did in the hospital. Gradually increase the amount you walk.   ?? ??  When you exercise, watch for signs that your heart is working too hard. You are pushing too hard if you cannot talk while you are exercising. If you become short of breath or dizzy or have chest pain, sit down and rest immediately.   ?? ?? Check your pulse regularly. Place two fingers on the artery at the palm side of your wrist, in line with your thumb. If your heartbeat seems uneven or fast, talk to your doctor.   When should you call for help?  Call 911 anytime you think you may need emergency care. For example, call if:  ?? ?? You have symptoms of a heart attack. These may include:  ? Chest pain or pressure, or a strange feeling in the chest.  ? Sweating.  ? Shortness of breath.  ? Nausea or vomiting.  ? Pain, pressure, or a strange feeling in the back, neck, jaw, or upper belly or in one or both shoulders or arms.  ? Lightheadedness or sudden weakness.  ? A fast or irregular heartbeat.  After you call 911, the operator may tell you to chew 1 adult-strength or 2 to 4 low-dose aspirin. Wait for an ambulance. Do not try to drive yourself.   ?? ?? You have symptoms of a stroke. These may include:  ? Sudden numbness, tingling, weakness, or loss of movement in your face, arm, or leg, especially on only one side of your body.  ? Sudden vision changes.  ? Sudden trouble speaking.  ? Sudden confusion or trouble understanding simple statements.  ? Sudden problems with walking or balance.  ? A sudden, severe headache that is different from past headaches.   ?? ?? You passed out (lost consciousness).   ??Call your doctor now or seek immediate medical care if:  ?? ?? You have new or increased shortness of breath.   ?? ?? You feel dizzy or lightheaded, or you feel like you may faint.   ?? ?? Your heart rate becomes irregular.   ?? ?? You can feel your heart flutter in your chest or skip heartbeats. Tell your doctor if these symptoms are new or worse.   ??Watch closely for changes in your health, and be sure to contact your doctor if you have any problems.  Where can you learn more?  Go to [facility] at [email or web address]  [facility] under the Resources menu. Enter U020 in the search box to learn more about Atrial Fibrillation: Care Instructions.  Current as of: March 22, 2018  Content Version: 12.2  ?? 2006-2019 [employer]. Care instructions adapted under license by [facility] If you have questions about a medical condition or this instruction, always ask your healthcare professional. [employer] disclaims any warranty or liability for your use of this information.         Patient Education        Learning About Atrial Fibrillation  What is atrial fibrillation?    Atrial fibrillation (say AY-tree-uhl fih-bruh-LAY-shun) is the most common type of irregular heartbeat (arrhythmia). Normally, the heart beats in a strong, steady rhythm. In atrial fibrillation, a problem with the heart's electrical system causes the two upper parts of the heart (the atria) to quiver, or fibrillate. Your heart rate also may be faster than normal.  Atrial fibrillation can be dangerous because if the heartbeat isn't strong and steady, blood can collect, or pool, in the atria. And pooled blood is more likely to form clots. Clots can travel to the brain, block  blood flow, and cause a stroke. Atrial fibrillation can also lead to heart failure.  Treatment for atrial fibrillation helps prevent stroke and heart failure. It also helps relieve symptoms.  Atrial fibrillation is often caused by another heart problem. It may happen after heart surgery. It may also be caused by other problems, such as an overactive thyroid gland or lung disease.  Many people with atrial fibrillation are able to live full and active lives.  What are the symptoms?  Some people feel symptoms when they have episodes of atrial fibrillation. But other people don't notice any symptoms.  If you have symptoms, you may feel:  ?? A fluttering, racing, or pounding feeling in your chest called palpitations.  ?? Weak or tired.  ?? Dizzy or lightheaded.  ?? Short of breath.  ?? Chest pain.  ?? Confused.  You may notice signs of atrial fibrillation when you check your pulse. Your pulse may seem uneven or fast.  What can you expect when you have atrial fibrillation?  At first, spells of atrial fibrillation may come on suddenly and last a short time. It may go away on its own or it goes away after treatment. This is called paroxysmal atrial fibrillation.  Over time, the spells may last longer and occur more often. They often don't go away on their own.  How is it treated?  Treatments can help you feel better and prevent future problems, especially stroke and heart failure.  The main types of treatment slow the heart rate, control the heart rhythm, and help prevent stroke. Your treatment will depend on the cause of your atrial fibrillation, your symptoms, and your risk for stroke.  ?? Heart rate treatment. Medicine may be used to slow your heart rate. Your heartbeat may still be irregular. But these medicines keep your heart from beating too fast. They may also help relieve your symptoms.  ?? Heart rhythm treatment. Different treatments may be used to try to stop atrial fibrillation and keep it from returning. They can also relieve symptoms. These treatments include medicine, electrical cardioversion to shock the heart back to a normal rhythm, a procedure called catheter ablation, and heart surgery.  ?? Stroke prevention. You and your doctor can decide how to lower your risk. You may decide to take a blood-thinning medicine called an anticoagulant.  How can you live well with it?  You can live well and help manage atrial fibrillation by having a heart-healthy lifestyle. This lifestyle may help reduce how often you have episodes of atrial fibrillation. If you are overweight, losing weight can help relieve symptoms.  To have a heart-healthy lifestyle:  ?? Don't smoke.  ?? Eat heart-healthy foods.  ?? Be active. Talk to your doctor about what type and level of exercise is safe for you.  ?? Stay at a healthy weight. Lose weight if you need to.  ?? Avoid alcohol if it triggers symptoms.  ?? Manage other health problems such as high blood pressure, high cholesterol, and diabetes.  ?? Avoid getting sick from the flu. Get a flu shot every year.  ?? Manage stress.  Where can you learn more?  Go to [facility] at [email or web address]  [facility] under the Resources menu. Enter L274 in the search box to learn more about Learning About Atrial Fibrillation.  Current as of: March 22, 2018  Content Version: 12.2  ?? 2006-2019 [employer]. Care instructions adapted under license by [facility]. If you have questions about a medical condition or  this instruction, always ask your healthcare professional. [employer] disclaims any warranty or liability for your use of this information.

## 2018-11-21 MED ORDER — ALLOPURINOL 100 MG TABLET
ORAL_TABLET | Freq: Every day | ORAL | 3 refills | 0 days | Status: CP
Start: 2018-11-21 — End: 2019-01-20

## 2018-11-21 MED ORDER — CALCITRIOL 0.25 MCG CAPSULE
ORAL_CAPSULE | ORAL | 3 refills | 0 days | Status: CP
Start: 2018-11-21 — End: 2019-01-20

## 2018-11-21 MED FILL — PROGRAF 1 MG CAPSULE: 30 days supply | Qty: 150 | Fill #0 | Status: AC

## 2018-11-22 ENCOUNTER — Ambulatory Visit: Admit: 2018-11-22 | Discharge: 2018-11-22 | Payer: MEDICARE

## 2018-11-22 ENCOUNTER — Other Ambulatory Visit: Admit: 2018-11-22 | Discharge: 2018-11-22 | Payer: MEDICARE

## 2018-11-22 ENCOUNTER — Ambulatory Visit: Admit: 2018-11-22 | Discharge: 2018-11-22 | Payer: MEDICARE | Attending: Nephrology | Primary: Nephrology

## 2018-11-22 DIAGNOSIS — I4892 Unspecified atrial flutter: Secondary | ICD-10-CM

## 2018-11-22 DIAGNOSIS — Z940 Kidney transplant status: Secondary | ICD-10-CM

## 2018-11-22 DIAGNOSIS — I10 Essential (primary) hypertension: Secondary | ICD-10-CM

## 2018-11-22 DIAGNOSIS — Z0000 Encounter for general adult medical examination without abnormal findings: Secondary | ICD-10-CM

## 2018-11-22 DIAGNOSIS — R809 Proteinuria, unspecified: Secondary | ICD-10-CM

## 2018-11-22 DIAGNOSIS — E8779 Other fluid overload: Secondary | ICD-10-CM

## 2018-11-22 DIAGNOSIS — D899 Disorder involving the immune mechanism, unspecified: Secondary | ICD-10-CM

## 2018-11-22 DIAGNOSIS — I4891 Unspecified atrial fibrillation: Principal | ICD-10-CM

## 2018-11-22 LAB — URINALYSIS
BACTERIA: NONE SEEN /HPF
BILIRUBIN UA: NEGATIVE
BLOOD UA: NEGATIVE
GLUCOSE UA: NEGATIVE
KETONES UA: NEGATIVE
LEUKOCYTE ESTERASE UA: NEGATIVE
NITRITE UA: NEGATIVE
PH UA: 5 (ref 5.0–9.0)
RBC UA: 1 /HPF (ref <=3)
SPECIFIC GRAVITY UA: 1.011 (ref 1.003–1.030)
SQUAMOUS EPITHELIAL: <1 /HPF (ref 0–5)
UROBILINOGEN UA: 0.2
WBC UA: <1 /HPF (ref <=2)

## 2018-11-22 LAB — PROTEIN / CREATININE RATIO, URINE
CREATININE, URINE: 73.2 mg/dL
PROTEIN URINE: 27.1 mg/dL

## 2018-11-22 LAB — CBC W/ AUTO DIFF
BASOPHILS ABSOLUTE COUNT: 0 10*9/L (ref 0.0–0.1)
BASOPHILS RELATIVE PERCENT: 0.4 %
EOSINOPHILS ABSOLUTE COUNT: 0.1 10*9/L (ref 0.0–0.4)
EOSINOPHILS RELATIVE PERCENT: 2.2 %
HEMATOCRIT: 38 % — ABNORMAL LOW (ref 41.0–53.0)
HEMOGLOBIN: 11.7 g/dL — ABNORMAL LOW (ref 13.5–17.5)
LYMPHOCYTES ABSOLUTE COUNT: 1 10*9/L — ABNORMAL LOW (ref 1.5–5.0)
LYMPHOCYTES RELATIVE PERCENT: 24.3 %
MEAN CORPUSCULAR HEMOGLOBIN CONC: 30.8 g/dL — ABNORMAL LOW (ref 31.0–37.0)
MEAN CORPUSCULAR HEMOGLOBIN: 29.1 pg (ref 26.0–34.0)
MEAN CORPUSCULAR VOLUME: 94.7 fL (ref 80.0–100.0)
MEAN PLATELET VOLUME: 9.5 fL (ref 7.0–10.0)
MONOCYTES RELATIVE PERCENT: 9.5 %
NEUTROPHILS ABSOLUTE COUNT: 2.6 10*9/L (ref 2.0–7.5)
NEUTROPHILS RELATIVE PERCENT: 62.1 %
PLATELET COUNT: 141 10*9/L — ABNORMAL LOW (ref 150–440)
RED BLOOD CELL COUNT: 4.02 10*12/L — ABNORMAL LOW (ref 4.50–5.90)
RED CELL DISTRIBUTION WIDTH: 15.2 % — ABNORMAL HIGH (ref 12.0–15.0)
WBC ADJUSTED: 4.2 10*9/L — ABNORMAL LOW (ref 4.5–11.0)

## 2018-11-22 LAB — BASIC METABOLIC PANEL
ANION GAP: 15 mmol/L (ref 7–15)
BLOOD UREA NITROGEN: 108 mg/dL — ABNORMAL HIGH (ref 7–21)
CALCIUM: 10.3 mg/dL — ABNORMAL HIGH (ref 8.5–10.2)
CHLORIDE: 104 mmol/L (ref 98–107)
CO2: 24 mmol/L (ref 22.0–30.0)
CREATININE: 3.27 mg/dL — ABNORMAL HIGH (ref 0.70–1.30)
EGFR CKD-EPI AA MALE: 22 mL/min/{1.73_m2} — ABNORMAL LOW (ref >=60)
EGFR CKD-EPI NON-AA MALE: 19 mL/min/{1.73_m2} — ABNORMAL LOW (ref >=60)
GLUCOSE RANDOM: 139 mg/dL (ref 65–179)
POTASSIUM: 4.5 mmol/L (ref 3.5–5.0)
SODIUM: 143 mmol/L (ref 135–145)

## 2018-11-22 LAB — SODIUM: Sodium:SCnc:Pt:Ser/Plas:Qn:: 143

## 2018-11-22 LAB — PHOSPHORUS: Phosphate:MCnc:Pt:Ser/Plas:Qn:: 3.8

## 2018-11-22 LAB — MAGNESIUM: Magnesium:MCnc:Pt:Ser/Plas:Qn:: 1.9

## 2018-11-22 LAB — LYMPHOCYTES RELATIVE PERCENT: Lab: 24.3

## 2018-11-22 LAB — MUCUS

## 2018-11-22 LAB — TACROLIMUS LEVEL, TROUGH: TACROLIMUS, TROUGH: 6.2 ng/mL (ref 5.0–15.0)

## 2018-11-22 LAB — TACROLIMUS, TROUGH: Lab: 6.2

## 2018-11-22 LAB — PROTEIN URINE: Protein:MCnc:Pt:Urine:Qn:: 27.1

## 2018-11-22 NOTE — Unmapped (Signed)
Saw patient in clinic today, reports he is doing well. Saw surgery last week and they did a EKG and showed Afib. Saw cardiologist and waiting to start any treatment.  May need blood thinner.    Denies any swelling, SOB, HA, abdominal pain, n/v/d, fevers, tremors, or problems with urination    20 pound weight loss in one month from swelling.  Making better food choices

## 2018-11-22 NOTE — Unmapped (Signed)
Transplant Nephrology Clinic Visit      History of Present Illness    Patient is a 67 y.o. male who underwent deceased donor transplant on 2007-06-18 secondary to lupus nephritis. His post transplant course has been complicated by early cellular rejection treated with Thymoglobulin. At that time, he was enrolled in the JAK-3 inhibitor trial and was taken off study drug. Additionally, he was noted to have a large pericardial effusion of greater than 1 L in 08/2007. At that time his TSH was 258. This improved with pericardiocentesis and thyroid replacement and has not reaccumulated. In 2009, he had coronary artery disease which required stenting. Patient does not have evidence of donor specific antibodies. His creatinine was between 1.5 and 2.2, but has been more in the low to mid 2s since his right native nephrectomy on 02/20/14 for renal cell carcinoma.  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 No evidence of DSA antibodies as of 05/20/2018. Most recent baseline creatinine while hospitalized was 2.6-2.8, had been around 2.2-2.6 immediately prior to that.    He was admitted to [facility] from 9/15 through 08/31/2016 after presenting with fever and leukocytosis. His initial urine culture specimen was lost, but he was treated for presumptive UTI. He improved clinically on antibiotics. During that evaluation he had a CT of the abdomen on 08/29/2016 that showed New indeterminate liver lesions concerning for metastatic disease versus post transplant lymphoproliferative disorder versus abscesses. He then underwent MRI on 9/18 which showed Two liver lesions with minimal enhancement. Differential includes post transplant lymphoproliferative disorder, sequelae of prior infection or hematoma, less likely acute abscess. Metastases unlikely given enhancement characteristics. EBV was negative. As an outpatient he then underwent a PET scan on 09/14/2016 which showed Enlarging right hepatic lobe lesion with intense FDG uptake and central area of photopenia, concerning for necrotic metastasis, less likely abscess. Stable right hepatic dome lesion with intense FDG uptake, concerning for additional metastasis. After consult with radiology, they felt the best approach was ultrasound guided biopsy which he had on 10/01/2016. Pathology revealed Fragments of liver tissue with parenchymal extinction, mixed inflammatory infiltrates, and reactive-appearing stromal changes (see comment), - Increased iron staining within adjacent intact hepatic parenchyma (grade 3 of 4), - Sinusoidal congestion. The differential diagnosis includes sequelae from an abscess (e.g., inflammatory pseudotumor) versus nonspecific inflammatory changes adjacent to an unsampled mass. Correlation with the radiologic findings and clinical follow up are suggested.    The patient had a repeat MRI on 02/01/17 to visualize lesions seen on MRI in September. His repeat MRI revealed interval moderate decrease in the size of previously detected liver lesions, which could represent the sequelae of resolving right hepatic lobe infection with interval decrease in size of large right hepatic lobe lesion with internal T1 hyperintensity likely reflecting proteinacious material and mild internal and perilesional enhancement. Interval decrease in size and conspicuity of small hepatic dome lesion is also noted and this could also represent a resolving abscess. They recommended a re-scan in approximately 3 months (04/2017). I reviewed the report and images with Dr. Rush Barer who agreed that the lesion continues to look like resolving infection.     Patient underwent renal biopsy on 12/30/2017 to evaluate proteinuria which had increased over the last year from 0.75 to 1.4. 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 no proliferative glomerular lesions. These changes can indicate a minor recurrence of the lupus nephritis not carrying any direct therapeutic and prognostic significance at the present time.); Tubular pigment deposits, consistent with lipofuscin.    He was admitted 3/1 through 02/14/18 with RSV infection, AKI and new diastolic and systolic heart failure. TTE showed moderate EF 40-45%, reduced from 52% reported in January 2015. Patient's home lasix was increased to 40 mg qd from 20 mg qd with improvement in symptoms. Creatinine peaked at 2.73, 2.69 on discharge. It was suspected that patient's RSV infection worsened his heart failure and caused AKI is due to reduced perfusion. Patient's enalapril was held on discharge.     Interval history since last visit 10/01/2018    He was admitted 11/4 through 10/21/18 after presenting with dyspnea secondary to acute on chronic systolic heart failure. Echo showed globally, mildly decreased EF of 45% and grade III diastolic dysfunction. Patient was diuresed with lasix and discharged home on 80 mg bid. He was also found to have 12 L/min flow on PVL fistula study and underwent fistula banding to prevent worsening cardiac function from high output heart failure. Creatinine was elevated at time of admission to 3 but downtrended with diuresis to 2.3-2.8. Tac dose was decreased to 3 mg qAM and 2 mg qPM. No other changes to immunosuppression. Enalapril held due to kidney dysfunction.     At surgery visit on 11/17/18 to discuss fistula ligation, EKG was ordered and patient noted to be in a-fib. Was able to see cardiology that afternoon. No episodes of a-fib while monitored in the hospital and no prior history. Patient asymptomatic and hemodynamically stable. Rate controlled on metoprolol 100 mg daily, not started on anticoagulation at that time.      Patient presents today for follow up visit, without major complaints. Patient has lost approximately 20 pounds since last visit in October and remains on lasix 80 mg BID. Fluid retention has significantly improved, no SOB. Patient remains off Enalapril. He was to get follow up labs 3-4 days after hospital discharge but did not have them drawn.    He reports that his weight prior to this onset of heart failure and fluid retention was around 160 pounds.    Review of Systems    Otherwise on review of systems patient denies fever or chills, chest pain, SOB, PND, orthopnea or edema. No N/V/abdominal pain. No current diarrhea. No dysuria, hematuria or difficulty voiding. All other systems are reviewed and are negative.    Medications    Current Outpatient Medications   Medication Sig Dispense Refill   ??? acyclovir (ZOVIRAX) 400 MG tablet TAKE (1) TABLET BY MOUTH EVERY DAY 90 tablet 3   ??? allopurinol (ZYLOPRIM) 100 MG tablet Take 1.5 tablets (150 mg total) by mouth daily. 135 tablet 3   ??? aspirin (ASPIRIN LOW-STRENGTH) 81 MG chewable tablet Chew 81 mg daily.     ??? atorvastatin (LIPITOR) 20 MG tablet Take 1 tablet (20 mg total) by mouth daily. 90 tablet 3   ??? calcitriol (ROCALTROL) 0.25 MCG capsule Take 1 capsule (0.25 mcg total) by mouth Every Monday, Wednesday, and Friday. 36 capsule 3   ??? CELLCEPT 250 mg capsule TAKE 1 CAPSULE BY MOUTH TWICE DAILY 180 each 3   ??? colchicine (COLCRYS) 0.6 mg tablet Take 1 tablet (0.6 mg total) by mouth daily as needed. for up to 1 dose (Patient not taking: Reported on 11/17/2018) 30 tablet  11   ??? ferrous sulfate 325 (65 FE) MG tablet Take 1 tablet (325 mg total) by mouth daily.  0   ??? fluticasone (FLONASE) 50 mcg/actuation nasal spray 1 spray into each nostril daily as needed.     ??? furosemide (LASIX) 40 MG tablet Take 2 tablets (80 mg total) by mouth Two (2) times a day. 120 tablet 0   ??? hydrocortisone 2.5 % ointment Only when needed in front of ear; can used more broadly on lower legs if needed for itching. (Patient not taking: Reported on 11/17/2018) 453 g 1   ??? levothyroxine (SYNTHROID) 200 MCG tablet Take 1 tab with two tab, total dose daily 90 tablet 3   ??? levothyroxine (SYNTHROID, LEVOTHROID) 25 MCG tablet Take 2 tabs with one tab, total dose daily 90 tablet 3   ??? magnesium oxide (MAG-OX) 400 mg (241.3 mg magnesium) tablet Take 1 tablet (400 mg total) by mouth Two (2) times a day. 180 tablet 3   ??? metoprolol succinate (TOPROL-XL) 100 MG 24 hr tablet Take 1 tablet (100 mg total) by mouth daily. 90 tablet 3   ??? omeprazole (PRILOSEC) 20 MG capsule Take 1 capsule (20 mg total) by mouth daily. 90 capsule 3   ??? predniSONE (DELTASONE) 5 MG tablet Take 1 tablet (5 mg total) by mouth daily. 90 tablet 3   ??? PROGRAF 1 mg capsule TAKE 3 CAPSULES (3MG ) BY MOUTH IN THE MORNING AND 2 CAPSULES (2MG ) BY MOUTH IN THE EVENING. 180 capsule 11   ??? simethicone (GAS-X) 80 MG chewable tablet Chew 1 tablet (80 mg total) every six (6) hours as needed for flatulence. 30 tablet 1   ??? triamcinolone (KENALOG) 0.1 % ointment Apply topically Two (2) times a day. For only 2 week BID topically from itchy area in front of ears. 80 g 1     No current facility-administered medications for this visit.        Physical Exam    BP 160/58 (BP Site: L Arm, BP Position: Sitting, BP Cuff Size: Medium)  - Pulse 60  - Temp 36.7 ??C (98.1 ??F) (Temporal)  - Ht 182.9 cm (6' 0.01)  - Wt 71.9 kg (158 lb 9.6 oz)  - BMI 21.51 kg/m??    General: Patient is a pleasant male in no apparent distress.  Appears younger than stated age.  Eyes: Sclera anicteric.  ENT: No erythema or exudate.  Neck: Supple without LAD/JVD/bruits.  Lungs: Clear to auscultation bilaterally, no wheezes/rales/rhonchi.  Cardiovascular: Irregularly irregular rate and rhythm without murmurs, rubs or gallops.  Abdomen: no distension, soft, nontender. Bowel sounds present. No tenderness over the graft. Extremities: trace edema to left lower extremity. Joints without evidence of synovitis.   Skin: Without rash.  Neurological: Grossly nonfocal.  Psychiatric: Mood and affect appropriate.    Laboratory Results    Recent Results (from the past 170 hour(s))   ECG 12 lead    Collection Time: 11/17/18 11:56 AM   Result Value Ref Range    EKG Systolic BP  mmHg    EKG Diastolic BP  mmHg    EKG Ventricular Rate 66 BPM    EKG Atrial Rate 256 BPM    EKG P-R Interval  ms    EKG QRS Duration 98 ms    EKG Q-T Interval 432 ms    EKG QTC Calculation 452 ms    EKG Calculated P Axis  degrees    EKG Calculated R Axis -17 degrees  EKG Calculated T Axis 100 degrees    QTC Fredericia 446 ms   ECG 12 Lead    Collection Time: 11/17/18  4:20 PM   Result Value Ref Range    EKG Systolic BP  mmHg    EKG Diastolic BP  mmHg    EKG Ventricular Rate 76 BPM    EKG Atrial Rate 271 BPM    EKG P-R Interval  ms    EKG QRS Duration 98 ms    EKG Q-T Interval 420 ms    EKG QTC Calculation 472 ms    EKG Calculated P Axis  degrees    EKG Calculated R Axis -11 degrees    EKG Calculated T Axis 114 degrees    QTC Fredericia 454 ms   Basic metabolic panel    Collection Time: 11/22/18 11:03 AM   Result Value Ref Range    Sodium 143 135 - 145 mmol/L    Potassium 4.5 3.5 - 5.0 mmol/L    Chloride 104 98 - 107 mmol/L    CO2 24.0 22.0 - 30.0 mmol/L    Anion Gap 15 7 - 15 mmol/L    BUN 108 (H) 7 - 21 mg/dL    Creatinine 1.61 (H) 0.70 - 1.30 mg/dL    BUN/Creatinine Ratio 33     EGFR CKD-EPI Non-African American, Male 19 (L) >=60 mL/min/1.60m2    EGFR CKD-EPI African American, Male 22 (L) >=60 mL/min/1.85m2    Glucose 139 65 - 179 mg/dL    Calcium 09.6 (H) 8.5 - 10.2 mg/dL   Tacrolimus Level, Trough    Collection Time: 11/22/18 11:03 AM   Result Value Ref Range    Tacrolimus, Trough 6.2 5.0 - 15.0 ng/mL   Magnesium Level    Collection Time: 11/22/18 11:03 AM   Result Value Ref Range    Magnesium 1.9 1.6 - 2.2 mg/dL   Phosphorus Level    Collection Time: 11/22/18 11:03 AM   Result Value Ref Range    Phosphorus 3.8 2.9 - 4.7 mg/dL   CBC w/ Differential    Collection Time: 11/22/18 11:03 AM   Result Value Ref Range    WBC 4.2 (L) 4.5 - 11.0 10*9/L    RBC 4.02 (L) 4.50 - 5.90 10*12/L    HGB 11.7 (L) 13.5 - 17.5 g/dL    HCT 04.5 (L) 40.9 - 53.0 %    MCV 94.7 80.0 - 100.0 fL    MCH 29.1 26.0 - 34.0 pg    MCHC 30.8 (L) 31.0 - 37.0 g/dL    RDW 81.1 (H) 91.4 - 15.0 %    MPV 9.5 7.0 - 10.0 fL    Platelet 141 (L) 150 - 440 10*9/L    Neutrophils % 62.1 %    Lymphocytes % 24.3 %    Monocytes % 9.5 %    Eosinophils % 2.2 %    Basophils % 0.4 %    Absolute Neutrophils 2.6 2.0 - 7.5 10*9/L    Absolute Lymphocytes 1.0 (L) 1.5 - 5.0 10*9/L    Absolute Monocytes 0.4 0.2 - 0.8 10*9/L    Absolute Eosinophils 0.1 0.0 - 0.4 10*9/L    Absolute Basophils 0.0 0.0 - 0.1 10*9/L    Large Unstained Cells 2 0 - 4 %    Macrocytosis Slight (A) Not Present    Hypochromasia Marked (A) Not Present   Protein/Creatinine Ratio, Urine    Collection Time: 11/22/18 12:15 PM   Result Value Ref Range  Creat U 73.2 Undefined mg/dL    Protein, Ur 25.9 Undefined mg/dL    Protein/Creatinine Ratio, Urine 0.370 Undefined   Urinalysis    Collection Time: 11/22/18 12:15 PM   Result Value Ref Range    Color, UA Yellow     Clarity, UA Clear     Specific Gravity, UA 1.011 1.003 - 1.030    pH, UA 5.0 5.0 - 9.0    Leukocyte Esterase, UA Negative Negative    Nitrite, UA Negative Negative    Protein, UA Trace (A) Negative    Glucose, UA Negative Negative, >1000 mg/dL    Ketones, UA Negative Negative    Urobilinogen, UA 0.2 mg/dL 0.2 mg/dL, 1.0 mg/dL    Bilirubin, UA Negative Negative    Blood, UA Negative Negative    RBC, UA 1 <=3 /HPF    WBC, UA <1 <=2 /HPF    Squam Epithel, UA <1 0 - 5 /HPF    Bacteria, UA None Seen None Seen /HPF    Mucus, UA Rare (A) None Seen /HPF         Assessment and Plan    1. Status post renal transplant. His creatinine today is elevated above his baseline, at 3.27 with BUN 108. I suspect this is due to over diuresis rather than rejection. Will have patient hold lasix tonight and 12/1, and 11/24/18. He should restart at 40 mg bid on 11/25/18. He will get repeat labs on Monday. If kidney function remains significantly elevated despite decreased dose of Lasix, could consider renal biopsy. His Prograf level of 6.2 is a 12 hour trough and within goal of 6-8. Will continue current immunosuppression. He remains on a reduced dose of CellCept 250mg  BID with history of diarrhea and malignancy.    2. Fluid overload and HFrEF. Patient was admitted at the beginning of November 2019 for acute on chronic CHF exacerbation, felt to be secondary to high output from his large AVF. He underwent underwent fistula banding, but unfortunately his output remained high. At this point I think he should just have the fistula ligated, and patient is amenable to that. For now, will hold diuretics as above and restart at a lower dose. He will continue to weigh himself daily. He is probably below his dry weight right now, as he is down to 153 pounds with a previous dry weight probably closer to 160 pounds. Will have his coordinator check on him next week to review his weights.    3. Right renal cell carcinoma status post nephrectomy. Imaging previously showed hepatic mass that was biopsied and consistent with inflammatory process. MRI 05/20/2018 remains reassuring, surgery did not recommend any additional diagnostic workup, will just repeat MRI in one year (05/2019).    4. Hypertension. Elevated in clinic today at 160/58. No changes made to current antihypertensive regimen at this time. Suspect will be better once we are able to restart lisinopril, which I will not do now in the setting of AKI.    5. New onset a-fib. Rate controlled on current dose of metoprolol. He is not currently on anticoagulation given history of thrombocytopenia. Cardiology is making that decision.     6. Hypothyroidism. TSH improved but remains slightly elevated at 7.93 on 10/18/18, down from 16.4 on 09/21/18. T3 low at 0.7. Will continue Synthroid at 250 mcg daily.    7. Hypomagnesemia. Magnesium normal today at 1.9. Has had diarrhea with higher levels of supplementation in the past. Tolerating 400 mg BID. Continue to encourage ingestion  of high magnesium foods (educational information given in the past).    8. Proteinuria.  UP/C peaked at 1.5 on 05/20/2018, down to 0.37 today, even off lisinopril. Renal biopsy on 12/30/17 showed immune complex mediated glomerulopathy that may be consistent with minor recurrence of lupus nephritis. Do not feel comfortable pushing Cellcept dose higher given diarrhea in the past.     9. Health maintenance. Patient has received 2 doses shingrix. He received flu shot in clinic 09/2018.    10. Will see patient back for scheduled appointment on 12/28/18 or sooner if needed. Will follow labs closely.      Scribe's Attestation: Lisbeth Ply, MD obtained and performed the history, physical exam and medical decision making elements that were entered into the chart.  Signed by Jerl Santos, Scribe, on November 22, 2018 at 12:30 PM.    ----------------------------------------------------------------------------------------------------------------------  November 23, 2018 12:56 PM. Documentation assistance provided by the Scribe. I was present during the time the encounter was recorded. The information recorded by the Scribe was done at my direction and has been reviewed and validated by me.  ----------------------------------------------------------------------------------------------------------------------

## 2018-11-23 LAB — CMV DNA, QUANTITATIVE, PCR

## 2018-11-23 LAB — CMV QUANT: Lab: 0

## 2018-11-23 NOTE — Unmapped (Signed)
I saw and evaluated the patient, participating in the key portions of the service and reviewing pertinent diagnostic studies and records. I reviewed the resident’s note and agree with the findings and plan.  - Iva Posten H Antion Andres, MD, FACC

## 2018-11-25 NOTE — Unmapped (Signed)
I called pt to see how he was doing since Dr. Carlene Coria saw him in clinic and lowered his lasix.  He is feeling well and denies any shortness of breath or swelling.  He will get labs drawn on Monday as planned.  He will call if he gets short of breath before Monday.

## 2018-11-29 ENCOUNTER — Other Ambulatory Visit
Admission: RE | Admit: 2018-11-29 | Discharge: 2018-11-29 | Disposition: A | Discharge status: Home / Self Care | Payer: Medicare Other | Attending: Nephrology | Admitting: Nephrology

## 2018-11-29 ENCOUNTER — Encounter: Admit: 2018-11-29 | Discharge: 2018-11-29 | Payer: MEDICARE | Attending: Nephrology | Primary: Nephrology

## 2018-11-29 DIAGNOSIS — Z09 Encounter for follow-up examination after completed treatment for conditions other than malignant neoplasm: Secondary | ICD-10-CM | POA: Insufficient documentation

## 2018-11-29 DIAGNOSIS — Z9483 Pancreas transplant status: Secondary | ICD-10-CM | POA: Diagnosis not present

## 2018-11-29 DIAGNOSIS — E1129 Type 2 diabetes mellitus with other diabetic kidney complication: Secondary | ICD-10-CM | POA: Diagnosis not present

## 2018-11-29 DIAGNOSIS — D899 Disorder involving the immune mechanism, unspecified: Secondary | ICD-10-CM | POA: Diagnosis present

## 2018-11-29 DIAGNOSIS — N390 Urinary tract infection, site not specified: Secondary | ICD-10-CM | POA: Diagnosis not present

## 2018-11-29 DIAGNOSIS — B259 Cytomegaloviral disease, unspecified: Secondary | ICD-10-CM | POA: Insufficient documentation

## 2018-11-29 DIAGNOSIS — Z940 Kidney transplant status: Secondary | ICD-10-CM | POA: Diagnosis not present

## 2018-11-29 DIAGNOSIS — Z79899 Other long term (current) drug therapy: Secondary | ICD-10-CM | POA: Diagnosis not present

## 2018-11-29 DIAGNOSIS — E559 Vitamin D deficiency, unspecified: Secondary | ICD-10-CM | POA: Diagnosis not present

## 2018-11-29 DIAGNOSIS — D631 Anemia in chronic kidney disease: Secondary | ICD-10-CM | POA: Insufficient documentation

## 2018-11-29 DIAGNOSIS — Z114 Encounter for screening for human immunodeficiency virus [HIV]: Secondary | ICD-10-CM | POA: Insufficient documentation

## 2018-11-29 DIAGNOSIS — Z789 Other specified health status: Secondary | ICD-10-CM | POA: Diagnosis not present

## 2018-11-29 LAB — CBC WITH DIFFERENTIAL/PLATELET
Abs Immature Granulocytes: 0.02 10*3/uL (ref 0.00–0.07)
Basophils Absolute: 0 10*3/uL (ref 0.0–0.1)
Basophils Relative: 0 %
EOS ABS: 0.2 10*3/uL (ref 0.0–0.5)
EOS PCT: 4 %
HEMATOCRIT: 36.9 % — AB (ref 39.0–52.0)
HEMOGLOBIN: 11.3 g/dL — AB (ref 13.0–17.0)
Immature Granulocytes: 0 %
LYMPHS ABS: 1.2 10*3/uL (ref 0.7–4.0)
LYMPHS PCT: 20 %
MCH: 28.5 pg (ref 26.0–34.0)
MCHC: 30.6 g/dL (ref 30.0–36.0)
MCV: 92.9 fL (ref 80.0–100.0)
MONO ABS: 0.6 10*3/uL (ref 0.1–1.0)
Monocytes Relative: 10 %
Neutro Abs: 3.9 10*3/uL (ref 1.7–7.7)
Neutrophils Relative %: 66 %
Platelets: 95 10*3/uL — ABNORMAL LOW (ref 150–400)
RBC: 3.97 MIL/uL — ABNORMAL LOW (ref 4.22–5.81)
RDW: 14.6 % (ref 11.5–15.5)
WBC: 5.9 10*3/uL (ref 4.0–10.5)
nRBC: 0 % (ref 0.0–0.2)

## 2018-11-29 LAB — MAGNESIUM: Magnesium: 1.9 mg/dL (ref 1.7–2.4)

## 2018-11-29 LAB — BASIC METABOLIC PANEL
ANION GAP: 10 (ref 5–15)
BUN: 95 mg/dL — AB (ref 8–23)
CALCIUM: 9.8 mg/dL (ref 8.9–10.3)
CO2: 27 mmol/L (ref 22–32)
Chloride: 104 mmol/L (ref 98–111)
Creatinine, Ser: 2.87 mg/dL — ABNORMAL HIGH (ref 0.61–1.24)
GFR calc Af Amer: 25 mL/min — ABNORMAL LOW (ref >60)
GFR, EST NON AFRICAN AMERICAN: 22 mL/min — AB (ref >60)
GLUCOSE: 132 mg/dL — AB (ref 70–99)
Potassium: 3.9 mmol/L (ref 3.5–5.1)
SODIUM: 141 mmol/L (ref 135–145)

## 2018-11-29 LAB — PHOSPHORUS: PHOSPHORUS: 3.3 mg/dL (ref 2.5–4.6)

## 2018-11-29 NOTE — Unmapped (Signed)
I called and left pt a message as he did not get his labs drawn yesterday as instructed.  I reminded him to get labs and if he did have labs drawn to let us know what he got them at.

## 2018-11-30 DIAGNOSIS — Z940 Kidney transplant status: Principal | ICD-10-CM

## 2018-11-30 DIAGNOSIS — Z79899 Other long term (current) drug therapy: Secondary | ICD-10-CM

## 2018-11-30 LAB — TACROLIMUS, TROUGH: Lab: 8.6

## 2018-12-01 LAB — CBC W/ DIFFERENTIAL

## 2018-12-01 LAB — PHOSPHORUS
Lab: 0
Lab: 0

## 2018-12-01 LAB — SODIUM: Lab: 141

## 2018-12-01 LAB — BASIC METABOLIC PANEL
BLOOD UREA NITROGEN: 95 mg/dL — ABNORMAL HIGH
CALCIUM: 9.8 mg/dL
CO2: 27 mmol/L
CREATININE: 2.87 mg/dL — ABNORMAL HIGH
EGFR MDRD AF AMER: 25 mL/min/{1.73_m2} — ABNORMAL LOW
GLUCOSE RANDOM: 132 mg/dL — ABNORMAL HIGH
POTASSIUM: 3.9 mmol/L

## 2018-12-01 LAB — MAGNESIUM
Lab: 0
Lab: 0

## 2018-12-01 LAB — POTASSIUM: Lab: 0

## 2018-12-01 LAB — MONOCYTES RELATIVE PERCENT: Lab: 0

## 2018-12-01 NOTE — Unmapped (Signed)
Called patient to review labs with him and ask to repeat labs next week.    Left VM for patient to get labs next week.

## 2018-12-05 LAB — CBC W/ DIFFERENTIAL
BASOPHILS ABSOLUTE COUNT: 0 10*9/L
BASOPHILS RELATIVE PERCENT: 0 %
EOSINOPHILS ABSOLUTE COUNT: 0.2 10*9/L
EOSINOPHILS RELATIVE PERCENT: 4 %
HEMATOCRIT: 36.9 % — ABNORMAL LOW
HEMOGLOBIN: 11.3 g/dL — ABNORMAL LOW
LYMPHOCYTES ABSOLUTE COUNT: 1.2 10*9/L
LYMPHOCYTES RELATIVE PERCENT: 20 %
MEAN CORPUSCULAR HEMOGLOBIN CONC: 30.6 g/dL
MEAN CORPUSCULAR HEMOGLOBIN: 28.5 pg
MEAN CORPUSCULAR VOLUME: 92.9 fL
MONOCYTES ABSOLUTE COUNT: 0.6 10*9/L
MONOCYTES RELATIVE PERCENT: 10 %
NEUTROPHILS ABSOLUTE COUNT: 3.9 10*9/L
NEUTROPHILS RELATIVE PERCENT: 66 %
NUCLEATED RED BLOOD CELLS: 0 /100{WBCs}
PLATELET COUNT: 95 10*9/L — ABNORMAL LOW
RED BLOOD CELL COUNT: 3.97 10*12/L — ABNORMAL LOW
RED CELL DISTRIBUTION WIDTH: 14.6 %
WBC ADJUSTED: 5.9 10*9/L

## 2018-12-05 LAB — PHOSPHORUS: Lab: 3.3

## 2018-12-05 LAB — MAGNESIUM: Lab: 1.9

## 2018-12-05 LAB — RED BLOOD CELL COUNT: Lab: 3.97 — ABNORMAL LOW

## 2018-12-08 NOTE — Unmapped (Signed)
[facility] Refill Coordination Note  Specialty Medication(s): Prograf 1mg  capsules  Additional Medications shipped: none    Miguel Ward, DOB: 04/08/1951  Phone: [phone] (home) , Alternate phone contact: N/A  Phone or address changes today?: No  All above HIPAA information was verified with patient.  Shipping Address: [address]  [place] [address]   Insurance changes? No    Completed refill call assessment today to schedule patient's medication shipment from the [facility] [phone]).      Confirmed the medication and dosage are correct and have not changed: Yes, regimen is correct and unchanged.    Confirmed patient started or stopped the following medications in the past month:  No, there are no changes reported at this time.    Are you tolerating your medication?:  Miguel Ward reports tolerating the medication.    ADHERENCE    Is this medicine transplant or covered by Medicare Part B? Yes.    Patient states he has 8 days supply on hand (37 capsules)    Did you miss any doses in the past 4 weeks? No missed doses reported.    FINANCIAL/SHIPPING    Delivery Scheduled: Yes, Expected medication delivery date: 12/16/18     Medication will be delivered via Next Day Courier to the home address in [facility].    The patient will receive a drug information handout for each medication shipped and additional FDA Medication Guides as required.     Miguel Ward did not have any additional questions at this time.    Delivery address confirmed in Epic.    We will follow up with patient monthly for standard refill processing and delivery.      Thank you,  Mervyn Gay   [facility] Specialty Pharmacist

## 2018-12-09 ENCOUNTER — Other Ambulatory Visit
Admission: RE | Admit: 2018-12-09 | Discharge: 2018-12-09 | Disposition: A | Discharge status: Home / Self Care | Payer: Medicare Other | Attending: Nephrology | Admitting: Nephrology

## 2018-12-09 ENCOUNTER — Encounter: Admit: 2018-12-09 | Discharge: 2018-12-09 | Payer: MEDICARE | Attending: Nephrology | Primary: Nephrology

## 2018-12-09 DIAGNOSIS — D631 Anemia in chronic kidney disease: Secondary | ICD-10-CM | POA: Insufficient documentation

## 2018-12-09 DIAGNOSIS — X58XXXA Exposure to other specified factors, initial encounter: Secondary | ICD-10-CM | POA: Diagnosis not present

## 2018-12-09 DIAGNOSIS — B259 Cytomegaloviral disease, unspecified: Secondary | ICD-10-CM | POA: Insufficient documentation

## 2018-12-09 DIAGNOSIS — Z940 Kidney transplant status: Secondary | ICD-10-CM | POA: Diagnosis not present

## 2018-12-09 DIAGNOSIS — Z79899 Other long term (current) drug therapy: Secondary | ICD-10-CM | POA: Insufficient documentation

## 2018-12-09 DIAGNOSIS — Z789 Other specified health status: Secondary | ICD-10-CM | POA: Insufficient documentation

## 2018-12-09 DIAGNOSIS — Z114 Encounter for screening for human immunodeficiency virus [HIV]: Secondary | ICD-10-CM | POA: Diagnosis not present

## 2018-12-09 DIAGNOSIS — T8610 Unspecified complication of kidney transplant: Secondary | ICD-10-CM | POA: Insufficient documentation

## 2018-12-09 DIAGNOSIS — N390 Urinary tract infection, site not specified: Secondary | ICD-10-CM | POA: Diagnosis not present

## 2018-12-09 DIAGNOSIS — D899 Disorder involving the immune mechanism, unspecified: Secondary | ICD-10-CM | POA: Diagnosis present

## 2018-12-09 DIAGNOSIS — E1129 Type 2 diabetes mellitus with other diabetic kidney complication: Secondary | ICD-10-CM | POA: Diagnosis not present

## 2018-12-09 DIAGNOSIS — Z9483 Pancreas transplant status: Secondary | ICD-10-CM | POA: Diagnosis not present

## 2018-12-09 LAB — CBC WITH DIFFERENTIAL/PLATELET
ABS IMMATURE GRANULOCYTES: 0.01 10*3/uL (ref 0.00–0.07)
BASOS ABS: 0 10*3/uL (ref 0.0–0.1)
BASOS PCT: 0 %
Eosinophils Absolute: 0.2 10*3/uL (ref 0.0–0.5)
Eosinophils Relative: 4 %
HCT: 37.7 % — ABNORMAL LOW (ref 39.0–52.0)
Hemoglobin: 11.6 g/dL — ABNORMAL LOW (ref 13.0–17.0)
IMMATURE GRANULOCYTES: 0 %
Lymphocytes Relative: 21 %
Lymphs Abs: 1 10*3/uL (ref 0.7–4.0)
MCH: 28.6 pg (ref 26.0–34.0)
MCHC: 30.8 g/dL (ref 30.0–36.0)
MCV: 92.9 fL (ref 80.0–100.0)
MONOS PCT: 17 %
Monocytes Absolute: 0.8 10*3/uL (ref 0.1–1.0)
NEUTROS ABS: 2.7 10*3/uL (ref 1.7–7.7)
NEUTROS PCT: 58 %
NRBC: 0 % (ref 0.0–0.2)
PLATELETS: 103 10*3/uL — AB (ref 150–400)
RBC: 4.06 MIL/uL — ABNORMAL LOW (ref 4.22–5.81)
RDW: 14.6 % (ref 11.5–15.5)
WBC: 4.7 10*3/uL (ref 4.0–10.5)

## 2018-12-09 LAB — BASIC METABOLIC PANEL
Anion gap: 9 (ref 5–15)
BUN: 73 mg/dL — AB (ref 8–23)
CHLORIDE: 108 mmol/L (ref 98–111)
CO2: 25 mmol/L (ref 22–32)
Calcium: 9.7 mg/dL (ref 8.9–10.3)
Creatinine, Ser: 2.57 mg/dL — ABNORMAL HIGH (ref 0.61–1.24)
GFR calc Af Amer: 29 mL/min — ABNORMAL LOW (ref >60)
GFR calc non Af Amer: 25 mL/min — ABNORMAL LOW (ref >60)
Glucose, Bld: 94 mg/dL (ref 70–99)
Potassium: 4.3 mmol/L (ref 3.5–5.1)
Sodium: 142 mmol/L (ref 135–145)

## 2018-12-09 LAB — PHOSPHORUS: Phosphorus: 2.8 mg/dL (ref 2.5–4.6)

## 2018-12-09 LAB — MAGNESIUM: MAGNESIUM: 1.7 mg/dL (ref 1.7–2.4)

## 2018-12-12 DIAGNOSIS — Z940 Kidney transplant status: Principal | ICD-10-CM

## 2018-12-12 DIAGNOSIS — Z79899 Other long term (current) drug therapy: Secondary | ICD-10-CM

## 2018-12-12 LAB — TACROLIMUS, TROUGH: Lab: 3.5 — ABNORMAL LOW

## 2018-12-13 NOTE — Unmapped (Signed)
Called patient to see when he took his Prograf Thursday night before labs and confirmed prograf dose    Patient reports he took his Prograf at 11pm Thursday night and then had labs Friday at noon (13 hour trough).    Discussed low result (3.5) with Dr. Carlene Coria who orders for patient to have blood work again next week and if still low we will increase tac dose.    Patient updated and he verbalized understanding to have labs drawn again next week.    Denies any complaints. Reports no swelling and stable weight.

## 2018-12-15 MED FILL — PROGRAF 1 MG CAPSULE: 30 days supply | Qty: 150 | Fill #1

## 2018-12-15 MED FILL — PROGRAF 1 MG CAPSULE: 30 days supply | Qty: 150 | Fill #1 | Status: AC

## 2018-12-19 MED ORDER — PREDNISONE 5 MG TABLET
ORAL_TABLET | Freq: Every day | ORAL | 3 refills | 0 days | Status: CP
Start: 2018-12-19 — End: 2019-01-20

## 2018-12-19 NOTE — Unmapped (Signed)
Pt request for RX Refill

## 2018-12-23 ENCOUNTER — Other Ambulatory Visit
Admission: RE | Admit: 2018-12-23 | Discharge: 2018-12-23 | Disposition: A | Discharge status: Home / Self Care | Payer: Medicare Other | Attending: Nephrology | Admitting: Nephrology

## 2018-12-23 ENCOUNTER — Ambulatory Visit: Admit: 2018-12-23 | Discharge: 2018-12-24 | Payer: MEDICARE

## 2018-12-23 DIAGNOSIS — Z940 Kidney transplant status: Principal | ICD-10-CM

## 2018-12-23 DIAGNOSIS — D631 Anemia in chronic kidney disease: Secondary | ICD-10-CM | POA: Insufficient documentation

## 2018-12-23 DIAGNOSIS — Z114 Encounter for screening for human immunodeficiency virus [HIV]: Secondary | ICD-10-CM | POA: Diagnosis not present

## 2018-12-23 DIAGNOSIS — Z9483 Pancreas transplant status: Secondary | ICD-10-CM | POA: Insufficient documentation

## 2018-12-23 DIAGNOSIS — E1129 Type 2 diabetes mellitus with other diabetic kidney complication: Secondary | ICD-10-CM | POA: Diagnosis not present

## 2018-12-23 DIAGNOSIS — Z79899 Other long term (current) drug therapy: Secondary | ICD-10-CM | POA: Insufficient documentation

## 2018-12-23 DIAGNOSIS — Z09 Encounter for follow-up examination after completed treatment for conditions other than malignant neoplasm: Secondary | ICD-10-CM | POA: Diagnosis not present

## 2018-12-23 DIAGNOSIS — E559 Vitamin D deficiency, unspecified: Secondary | ICD-10-CM | POA: Insufficient documentation

## 2018-12-23 DIAGNOSIS — Z789 Other specified health status: Secondary | ICD-10-CM | POA: Insufficient documentation

## 2018-12-23 DIAGNOSIS — B259 Cytomegaloviral disease, unspecified: Secondary | ICD-10-CM | POA: Diagnosis not present

## 2018-12-23 DIAGNOSIS — N189 Chronic kidney disease, unspecified: Secondary | ICD-10-CM | POA: Insufficient documentation

## 2018-12-23 DIAGNOSIS — T8610 Unspecified complication of kidney transplant: Secondary | ICD-10-CM | POA: Insufficient documentation

## 2018-12-23 DIAGNOSIS — N390 Urinary tract infection, site not specified: Secondary | ICD-10-CM | POA: Diagnosis not present

## 2018-12-23 DIAGNOSIS — D899 Disorder involving the immune mechanism, unspecified: Secondary | ICD-10-CM | POA: Insufficient documentation

## 2018-12-23 LAB — BASIC METABOLIC PANEL
Anion gap: 8 (ref 5–15)
BLOOD UREA NITROGEN: 83 mg/dL — ABNORMAL HIGH
BUN: 83 mg/dL — ABNORMAL HIGH (ref 8–23)
CALCIUM: 9.7 mg/dL
CHLORIDE: 106 mmol/L (ref 98–111)
CO2: 26 mmol/L (ref 22–32)
CREATININE: 2.82 mg/dL — AB (ref 0.61–1.24)
CREATININE: 2.82 mg/dL — ABNORMAL HIGH
Calcium: 9.7 mg/dL (ref 8.9–10.3)
EGFR MDRD AF AMER: 26 mL/min/{1.73_m2} — ABNORMAL LOW
GFR, EST AFRICAN AMERICAN: 26 mL/min — AB (ref >60)
GFR, EST NON AFRICAN AMERICAN: 22 mL/min — AB (ref >60)
GLUCOSE RANDOM: 126 mg/dL — ABNORMAL HIGH
Glucose, Bld: 126 mg/dL — ABNORMAL HIGH (ref 70–99)
POTASSIUM: 4.1 mmol/L (ref 3.5–5.1)
SODIUM: 140 mmol/L
Sodium: 140 mmol/L (ref 135–145)

## 2018-12-23 LAB — CBC WITH DIFFERENTIAL/PLATELET
Abs Immature Granulocytes: 0.02 10*3/uL (ref 0.00–0.07)
Basophils Absolute: 0 10*3/uL (ref 0.0–0.1)
Basophils Relative: 0 %
Eosinophils Absolute: 0.1 10*3/uL (ref 0.0–0.5)
Eosinophils Relative: 2 %
HCT: 37.2 % — ABNORMAL LOW (ref 39.0–52.0)
Hemoglobin: 11.5 g/dL — ABNORMAL LOW (ref 13.0–17.0)
Immature Granulocytes: 0 %
Lymphocytes Relative: 22 %
Lymphs Abs: 1.3 10*3/uL (ref 0.7–4.0)
MCH: 29.1 pg (ref 26.0–34.0)
MCHC: 30.9 g/dL (ref 30.0–36.0)
MCV: 94.2 fL (ref 80.0–100.0)
Monocytes Absolute: 0.7 10*3/uL (ref 0.1–1.0)
Monocytes Relative: 11 %
Neutro Abs: 3.8 10*3/uL (ref 1.7–7.7)
Neutrophils Relative %: 65 %
Platelets: 101 10*3/uL — ABNORMAL LOW (ref 150–400)
RBC: 3.95 MIL/uL — ABNORMAL LOW (ref 4.22–5.81)
RDW: 14.6 % (ref 11.5–15.5)
WBC: 5.9 10*3/uL (ref 4.0–10.5)
nRBC: 0 % (ref 0.0–0.2)

## 2018-12-23 LAB — MAGNESIUM
Lab: 2
MAGNESIUM: 2 mg/dL (ref 1.7–2.4)

## 2018-12-23 LAB — PHOSPHORUS
Lab: 2.7
PHOSPHORUS: 2.7 mg/dL (ref 2.5–4.6)

## 2018-12-23 LAB — CBC W/ DIFFERENTIAL
BASOPHILS ABSOLUTE COUNT: 0 10*9/L
BASOPHILS RELATIVE PERCENT: 0 %
EOSINOPHILS ABSOLUTE COUNT: 0.1 10*9/L
EOSINOPHILS RELATIVE PERCENT: 2 %
HEMATOCRIT: 37.2 % — ABNORMAL LOW
HEMOGLOBIN: 11.5 g/dL — ABNORMAL LOW
LYMPHOCYTES ABSOLUTE COUNT: 1.3 10*9/L
LYMPHOCYTES RELATIVE PERCENT: 22 %
MEAN CORPUSCULAR HEMOGLOBIN CONC: 30.9 g/dL
MEAN CORPUSCULAR HEMOGLOBIN: 29.1 pg
MEAN CORPUSCULAR VOLUME: 94.2 fL
MONOCYTES ABSOLUTE COUNT: 0.7 10*9/L
MONOCYTES RELATIVE PERCENT: 11 %
NEUTROPHILS ABSOLUTE COUNT: 3.8 10*9/L
NEUTROPHILS RELATIVE PERCENT: 65 %
NUCLEATED RED BLOOD CELLS: 0 /100{WBCs}
PLATELET COUNT: 101 10*9/L — ABNORMAL LOW
RED BLOOD CELL COUNT: 3.95 10*12/L — ABNORMAL LOW
RED CELL DISTRIBUTION WIDTH: 14.6 %
WBC ADJUSTED: 5.9 10*9/L

## 2018-12-23 LAB — CREATININE: Lab: 2.82 — ABNORMAL HIGH

## 2018-12-23 LAB — MICROCYTES: Lab: 0

## 2018-12-28 ENCOUNTER — Ambulatory Visit: Admit: 2018-12-28 | Discharge: 2018-12-29 | Payer: MEDICARE | Attending: Nephrology | Primary: Nephrology

## 2018-12-28 ENCOUNTER — Ambulatory Visit: Admit: 2018-12-28 | Discharge: 2018-12-29 | Payer: MEDICARE

## 2018-12-28 DIAGNOSIS — Z940 Kidney transplant status: Principal | ICD-10-CM

## 2018-12-28 DIAGNOSIS — R801 Persistent proteinuria, unspecified: Secondary | ICD-10-CM

## 2018-12-28 DIAGNOSIS — I10 Essential (primary) hypertension: Secondary | ICD-10-CM

## 2018-12-28 DIAGNOSIS — D899 Disorder involving the immune mechanism, unspecified: Secondary | ICD-10-CM

## 2018-12-28 DIAGNOSIS — Z0000 Encounter for general adult medical examination without abnormal findings: Secondary | ICD-10-CM

## 2018-12-28 DIAGNOSIS — N183 Chronic kidney disease, stage 3 (moderate): Secondary | ICD-10-CM

## 2018-12-28 DIAGNOSIS — I5022 Chronic systolic (congestive) heart failure: Secondary | ICD-10-CM

## 2018-12-28 LAB — BASIC METABOLIC PANEL
ANION GAP: 10 mmol/L (ref 7–15)
BLOOD UREA NITROGEN: 70 mg/dL — ABNORMAL HIGH (ref 7–21)
CALCIUM: 9.8 mg/dL (ref 8.5–10.2)
CHLORIDE: 104 mmol/L (ref 98–107)
CO2: 26 mmol/L (ref 22.0–30.0)
CREATININE: 2.67 mg/dL — ABNORMAL HIGH (ref 0.70–1.30)
EGFR CKD-EPI AA MALE: 28 mL/min/{1.73_m2} — ABNORMAL LOW (ref >=60)
EGFR CKD-EPI NON-AA MALE: 24 mL/min/{1.73_m2} — ABNORMAL LOW (ref >=60)
POTASSIUM: 4.3 mmol/L (ref 3.5–5.0)
SODIUM: 140 mmol/L (ref 135–145)

## 2018-12-28 LAB — URINALYSIS
BACTERIA: NONE SEEN /HPF
BILIRUBIN UA: NEGATIVE
GLUCOSE UA: NEGATIVE
KETONES UA: NEGATIVE
LEUKOCYTE ESTERASE UA: NEGATIVE
NITRITE UA: NEGATIVE
PH UA: 5 (ref 5.0–9.0)
RBC UA: <1 /HPF (ref <=3)
SPECIFIC GRAVITY UA: 1.009 (ref 1.003–1.030)
SQUAMOUS EPITHELIAL: <1 /HPF (ref 0–5)
WBC UA: <1 /HPF (ref <=2)

## 2018-12-28 LAB — CBC W/ AUTO DIFF
BASOPHILS ABSOLUTE COUNT: 0 10*9/L (ref 0.0–0.1)
BASOPHILS RELATIVE PERCENT: 0.4 %
EOSINOPHILS ABSOLUTE COUNT: 0.1 10*9/L (ref 0.0–0.4)
EOSINOPHILS RELATIVE PERCENT: 2.6 %
HEMATOCRIT: 36.4 % — ABNORMAL LOW (ref 41.0–53.0)
HEMOGLOBIN: 10.8 g/dL — ABNORMAL LOW (ref 13.5–17.5)
LARGE UNSTAINED CELLS: 3 % (ref 0–4)
LYMPHOCYTES ABSOLUTE COUNT: 0.5 10*9/L — ABNORMAL LOW (ref 1.5–5.0)
LYMPHOCYTES RELATIVE PERCENT: 10.2 %
MEAN CORPUSCULAR HEMOGLOBIN CONC: 29.7 g/dL — ABNORMAL LOW (ref 31.0–37.0)
MEAN CORPUSCULAR HEMOGLOBIN: 28.5 pg (ref 26.0–34.0)
MEAN CORPUSCULAR VOLUME: 96 fL (ref 80.0–100.0)
MEAN PLATELET VOLUME: 9.7 fL (ref 7.0–10.0)
MONOCYTES ABSOLUTE COUNT: 0.4 10*9/L (ref 0.2–0.8)
NEUTROPHILS ABSOLUTE COUNT: 3.8 10*9/L (ref 2.0–7.5)
NEUTROPHILS RELATIVE PERCENT: 76.2 %
PLATELET COUNT: 100 10*9/L — ABNORMAL LOW (ref 150–440)
RED BLOOD CELL COUNT: 3.79 10*12/L — ABNORMAL LOW (ref 4.50–5.90)
RED CELL DISTRIBUTION WIDTH: 14.7 % (ref 12.0–15.0)
WBC ADJUSTED: 5 10*9/L (ref 4.5–11.0)

## 2018-12-28 LAB — POTASSIUM: Potassium:SCnc:Pt:Ser/Plas:Qn:: 4.3

## 2018-12-28 LAB — MAGNESIUM: Magnesium:MCnc:Pt:Ser/Plas:Qn:: 1.7

## 2018-12-28 LAB — PHOSPHORUS: Phosphate:MCnc:Pt:Ser/Plas:Qn:: 3.2

## 2018-12-28 LAB — SMEAR REVIEW

## 2018-12-28 LAB — MONOCYTES RELATIVE PERCENT: Lab: 8

## 2018-12-28 LAB — CREATININE, URINE: Lab: 44.1

## 2018-12-28 LAB — PROTEIN / CREATININE RATIO, URINE: PROTEIN/CREAT RATIO, URINE: 0.469

## 2018-12-28 LAB — SPECIFIC GRAVITY UA: Lab: 1.009

## 2018-12-28 MED ORDER — FUROSEMIDE 40 MG TABLET
ORAL_TABLET | Freq: Two times a day (BID) | ORAL | 3 refills | 0 days | Status: CP
Start: 2018-12-28 — End: 2019-01-27

## 2018-12-28 NOTE — Unmapped (Deleted)
{** REMINDER - THIS NOTE IS NOT A FINAL MEDICAL RECORD UNTIL IT IS SIGNED.  UNTIL THEN, THE CONTENT BELOW MAY REFLECT INFORMATION FROM A DOCUMENTATION TEMPLATE, NOT THE ACTUAL PATIENT VISIT. **}    Transplant Nephrology Clinic Visit      History of Present Illness    Patient is a 68 y.o. male who underwent deceased donor transplant on 07/09/07 secondary to lupus nephritis. His post transplant course has been complicated by early cellular rejection treated with Thymoglobulin. At that time, he was enrolled in the JAK-3 inhibitor trial and was taken off study drug. Additionally, he was noted to have a large pericardial effusion of greater than 1 L in 08/2007. At that time his TSH was 258. This improved with pericardiocentesis and thyroid replacement and has not reaccumulated. In 2009, he had coronary artery disease which required stenting. Patient does not have evidence of donor specific antibodies. His creatinine was between 1.5 and 2.2, but has been more in the low to mid 2s since his right native nephrectomy on 02/20/14 for renal cell carcinoma.  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 No evidence of DSA antibodies as of 05/20/2018. Most recent baseline creatinine while hospitalized was 2.6-2.8, had been around 2.2-2.6 immediately prior to that.    He was admitted to [facility] from 9/15 through 08/31/2016 after presenting with fever and leukocytosis. His initial urine culture specimen was lost, but he was treated for presumptive UTI. He improved clinically on antibiotics. During that evaluation he had a CT of the abdomen on 08/29/2016 that showed New indeterminate liver lesions concerning for metastatic disease versus post transplant lymphoproliferative disorder versus abscesses. He then underwent MRI on 9/18 which showed Two liver lesions with minimal enhancement. Differential includes post transplant lymphoproliferative disorder, sequelae of prior infection or hematoma, less likely acute abscess. Metastases unlikely given enhancement characteristics. EBV was negative.    As an outpatient he then underwent a PET scan on 09/14/2016 which showed Enlarging right hepatic lobe lesion with intense FDG uptake and central area of photopenia, concerning for necrotic metastasis, less likely abscess. Stable right hepatic dome lesion with intense FDG uptake, concerning for additional metastasis. After consult with radiology, they felt the best approach was ultrasound guided biopsy which he had on 10/01/2016. Pathology revealed Fragments of liver tissue with parenchymal extinction, mixed inflammatory infiltrates, and reactive-appearing stromal changes (see comment), - Increased iron staining within adjacent intact hepatic parenchyma (grade 3 of 4), - Sinusoidal congestion. The differential diagnosis includes sequelae from an abscess (e.g., inflammatory pseudotumor) versus nonspecific inflammatory changes adjacent to an unsampled mass. Correlation with the radiologic findings and clinical follow up are suggested.    The patient had a repeat MRI on 02/01/17 to visualize lesions seen on MRI in September. His repeat MRI revealed interval moderate decrease in the size of previously detected liver lesions, which could represent the sequelae of resolving right hepatic lobe infection with interval decrease in size of large right hepatic lobe lesion with internal T1 hyperintensity likely reflecting proteinacious material and mild internal and perilesional enhancement. Interval decrease in size and conspicuity of small hepatic dome lesion is also noted and this could also represent a resolving abscess. They recommended a re-scan in approximately 3 months (04/2017). I reviewed the report and images with Dr. Rush Barer who agreed that the lesion continues to look like resolving infection.     Patient underwent renal  biopsy on 12/30/2017 to evaluate proteinuria which had increased over the last year from 0.75 to 1.4. 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 no proliferative glomerular lesions. These changes can indicate a minor recurrence of the lupus nephritis not carrying any direct therapeutic and prognostic significance at the present time.); Tubular pigment deposits, consistent with lipofuscin.    He was admitted 3/1 through 02/14/18 with RSV infection, AKI and new diastolic and systolic heart failure. TTE showed moderate EF 40-45%, reduced from 52% reported in January 2015. Patient's home lasix was increased to 40 mg qd from 20 mg qd with improvement in symptoms. Creatinine peaked at 2.73, 2.69 on discharge. It was suspected that patient's RSV infection worsened his heart failure and caused AKI is due to reduced perfusion. Patient's enalapril was held on discharge.     He was admitted 11/4 through 10/21/18 after presenting with dyspnea secondary to acute on chronic systolic heart failure. Echo showed globally, mildly decreased EF of 45% and grade III diastolic dysfunction. Patient was diuresed with lasix and discharged home on 80 mg bid. He was also found to have 12 L/min flow on PVL fistula study and underwent fistula banding to prevent worsening cardiac function from high output heart failure. Creatinine was elevated at time of admission to 3 but downtrended with diuresis to 2.3-2.8. Tac dose was decreased to 3 mg qAM and 2 mg qPM. No other changes to immunosuppression. Enalapril held due to kidney dysfunction.     At surgery visit on 11/17/18 to discuss fistula ligation, EKG was ordered and patient noted to be in a-fib. Was able to see cardiology that afternoon. No episodes of a-fib while monitored in the hospital and no prior history. Patient asymptomatic and hemodynamically stable. Rate controlled on metoprolol 100 mg daily, not started on anticoagulation at that time.      Interval history since last visit 11/22/2018    Patient presents today for his follow up visit, without major complaints.     Review of Systems    Otherwise on review of systems patient denies fever or chills, chest pain, SOB, PND, orthopnea or edema. No N/V/abdominal pain. No current diarrhea. No dysuria, hematuria or difficulty voiding. All other systems are reviewed and are negative.    Medications    Current Outpatient Medications   Medication Sig Dispense Refill   ??? acyclovir (ZOVIRAX) 400 MG tablet TAKE (1) TABLET BY MOUTH EVERY DAY 90 tablet 3   ??? allopurinol (ZYLOPRIM) 100 MG tablet Take 1.5 tablets (150 mg total) by mouth daily. 135 tablet 3   ??? aspirin (ASPIRIN LOW-STRENGTH) 81 MG chewable tablet Chew 81 mg daily.     ??? atorvastatin (LIPITOR) 20 MG tablet Take 1 tablet (20 mg total) by mouth daily. 90 tablet 3   ??? calcitriol (ROCALTROL) 0.25 MCG capsule Take 1 capsule (0.25 mcg total) by mouth Every Monday, Wednesday, and Friday. 36 capsule 3   ??? CELLCEPT 250 mg capsule TAKE 1 CAPSULE BY MOUTH TWICE DAILY 180 each 3   ??? colchicine (COLCRYS) 0.6 mg tablet Take 1 tablet (0.6 mg total) by mouth daily as needed. for up to 1 dose (Patient not taking: Reported on 11/17/2018) 30 tablet 11   ??? ferrous sulfate 325 (65 FE) MG tablet Take 1 tablet (325 mg total) by mouth daily.  0   ??? fluticasone (FLONASE)  50 mcg/actuation nasal spray 1 spray into each nostril daily as needed.     ??? furosemide (LASIX) 40 MG tablet Take 1 tablet (40 mg total) by mouth Two (2) times a day. 180 tablet 3   ??? hydrocortisone 2.5 % ointment Only when needed in front of ear; can used more broadly on lower legs if needed for itching. (Patient not taking: Reported on 11/17/2018) 453 g 1   ??? levothyroxine (SYNTHROID) 200 MCG tablet Take 1 tab with two tab, total dose daily 90 tablet 3   ??? levothyroxine (SYNTHROID, LEVOTHROID) 25 MCG tablet Take 2 tabs with one tab, total dose daily 90 tablet 3   ??? magnesium oxide (MAG-OX) 400 mg (241.3 mg magnesium) tablet Take 1 tablet (400 mg total) by mouth Two (2) times a day. 180 tablet 3   ??? metoprolol succinate (TOPROL-XL) 100 MG 24 hr tablet Take 1 tablet (100 mg total) by mouth daily. 90 tablet 3   ??? omeprazole (PRILOSEC) 20 MG capsule Take 1 capsule (20 mg total) by mouth daily. 90 capsule 3   ??? predniSONE (DELTASONE) 5 MG tablet Take 1 tablet (5 mg total) by mouth daily. 90 tablet 3   ??? PROGRAF 1 mg capsule TAKE 3 CAPSULES (3MG ) BY MOUTH IN THE MORNING AND 2 CAPSULES (2MG ) BY MOUTH IN THE EVENING. 180 capsule 11   ??? simethicone (GAS-X) 80 MG chewable tablet Chew 1 tablet (80 mg total) every six (6) hours as needed for flatulence. 30 tablet 1   ??? triamcinolone (KENALOG) 0.1 % ointment Apply topically Two (2) times a day. For only 2 week BID topically from itchy area in front of ears. 80 g 1     No current facility-administered medications for this visit.        Physical Exam    BP 136/82 (BP Site: L Arm, BP Position: Sitting, BP Cuff Size: Large)  - Pulse 56  - Temp 36.5 ??C (97.7 ??F) (Temporal)  - Resp 20  - Wt 73.9 kg (163 lb)  - BMI 22.10 kg/m??    General: Patient is a pleasant male in no apparent distress.  Appears younger than stated age.  Eyes: Sclera anicteric.  ENT: No erythema or exudate.  Neck: Supple without LAD/JVD/bruits.  Lungs: Clear to auscultation bilaterally, no wheezes/rales/rhonchi.  Cardiovascular: Irregularly irregular rate and rhythm without murmurs, rubs or gallops.  Abdomen: no distension, soft, nontender. Bowel sounds present. No tenderness over the graft.  Extremities: trace edema to left lower extremity. Joints without evidence of synovitis.   Skin: Without rash.  Neurological: Grossly nonfocal.  Psychiatric: Mood and affect appropriate.    Laboratory Results Recent Results (from the past 170 hour(s))   Basic Metabolic Panel    Collection Time: 12/23/18 12:30 PM   Result Value Ref Range    Sodium 140 mmol/L    Potassium 4.1 mmol/L    Chloride 106 mmol/L    CO2 26.0 mmol/L    BUN 83 (H) mg/dL    Creatinine 5.78 (H) mg/dL    Glucose 469 (H) mg/dL    Calcium 9.7 mg/dL    EGFR MDRD Non Af Amer      EGFR MDRD Af Amer 26 (L) mL/min/1.54m2   Magnesium Level    Collection Time: 12/23/18 12:30 PM   Result Value Ref Range    Magnesium 2.0 mg/dL   Phosphorus Level    Collection Time: 12/23/18 12:30 PM   Result Value Ref Range  Phosphorus 2.7 mg/dL   CBC w/ Differential    Collection Time: 12/23/18 12:30 PM   Result Value Ref Range    Results Verified by Slide Scan      WBC 5.9 10*9/L    WBC      RBC 3.95 (L) 10*12/L    HGB 11.5 (L) g/dL    HCT 45.4 (L) %    MCV 94.2 fL    MCH 29.1 pg    MCHC 30.9 g/dL    RDW 09.8 %    MPV      Platelet 101 (L) 10*9/L    nRBC 0 /100 WBCs    Neutrophils % 65.0 %    Lymphocytes % 22.0 %    Monocytes % 11.0 %    Eosinophils % 2.0 %    Basophils % 0.0 %    Absolute Neutrophils 3.8 10*9/L    Absolute Lymphocytes 1.3 10*9/L    Absolute Monocytes 0.7 10*9/L    Absolute Eosinophils 0.1 10*9/L    Absolute Basophils 0.0 10*9/L    Microcytosis      Macrocytosis      Anisocytosis      Hyperchromasia      Hypochromasia     Tacrolimus Level, Trough    Collection Time: 12/23/18 12:30 PM   Result Value Ref Range    Tacrolimus, Trough 5.7 5.0 - 15.0 ng/mL   Basic Metabolic Panel    Collection Time: 12/28/18  2:01 PM   Result Value Ref Range    Sodium 140 135 - 145 mmol/L    Potassium 4.3 3.5 - 5.0 mmol/L    Chloride 104 98 - 107 mmol/L    CO2 26.0 22.0 - 30.0 mmol/L    Anion Gap 10 7 - 15 mmol/L    BUN 70 (H) 7 - 21 mg/dL    Creatinine 1.19 (H) 0.70 - 1.30 mg/dL    BUN/Creatinine Ratio 26     EGFR CKD-EPI Non-African American, Male 24 (L) >=60 mL/min/1.63m2    EGFR CKD-EPI African American, Male 28 (L) >=60 mL/min/1.32m2    Glucose 118 70 - 179 mg/dL    Calcium 9.8 8.5 - 14.7 mg/dL   Magnesium Level    Collection Time: 12/28/18  2:01 PM   Result Value Ref Range    Magnesium 1.7 1.6 - 2.2 mg/dL   Phosphorus Level    Collection Time: 12/28/18  2:01 PM   Result Value Ref Range    Phosphorus 3.2 2.9 - 4.7 mg/dL   CBC w/ Differential    Collection Time: 12/28/18  2:01 PM   Result Value Ref Range    WBC 5.0 4.5 - 11.0 10*9/L    RBC 3.79 (L) 4.50 - 5.90 10*12/L    HGB 10.8 (L) 13.5 - 17.5 g/dL    HCT 82.9 (L) 56.2 - 53.0 %    MCV 96.0 80.0 - 100.0 fL    MCH 28.5 26.0 - 34.0 pg    MCHC 29.7 (L) 31.0 - 37.0 g/dL    RDW 13.0 86.5 - 78.4 %    MPV 9.7 7.0 - 10.0 fL    Platelet 100 (L) 150 - 440 10*9/L    Neutrophils % 76.2 %    Lymphocytes % 10.2 %    Monocytes % 8.0 %    Eosinophils % 2.6 %    Basophils % 0.4 %    Absolute Neutrophils 3.8 2.0 - 7.5 10*9/L    Absolute Lymphocytes 0.5 (L) 1.5 - 5.0 10*9/L  Absolute Monocytes 0.4 0.2 - 0.8 10*9/L    Absolute Eosinophils 0.1 0.0 - 0.4 10*9/L    Absolute Basophils 0.0 0.0 - 0.1 10*9/L    Large Unstained Cells 3 0 - 4 %    Macrocytosis Slight (A) Not Present    Hypochromasia Marked (A) Not Present   Morphology Review    Collection Time: 12/28/18  2:01 PM   Result Value Ref Range    Smear Review Comments See Comment (A) Undefined   Urinalysis    Collection Time: 12/28/18  2:27 PM   Result Value Ref Range    Color, UA Colorless     Clarity, UA Clear     Specific Gravity, UA 1.009 1.003 - 1.030    pH, UA 5.0 5.0 - 9.0    Leukocyte Esterase, UA Negative Negative    Nitrite, UA Negative Negative    Protein, UA Trace (A) Negative    Glucose, UA Negative Negative    Ketones, UA Negative Negative    Urobilinogen, UA 0.2 mg/dL 0.2 mg/dL, 1.0 mg/dL    Bilirubin, UA Negative Negative    Blood, UA Moderate (A) Negative    RBC, UA <1 <=3 /HPF    WBC, UA <1 <=2 /HPF    Squam Epithel, UA <1 0 - 5 /HPF    Bacteria, UA None Seen None Seen /HPF   Protein/Creatinine Ratio, Urine    Collection Time: 12/28/18  2:27 PM   Result Value Ref Range    Creat U 44.1 Undefined mg/dL    Protein, Ur 16.1 Undefined mg/dL    Protein/Creatinine Ratio, Urine 0.469 Undefined       Assessment and Plan    1. Status post renal transplant. His creatinine today is elevated above his baseline, at 2.67 with BUN 70. I suspect this is due to over diuresis rather than rejection. Held lasix on 11/22/18, 12/11, and 11/24/18. He continued on Lasix 40 mg bid on 11/25/18. If kidney function remains significantly elevated despite decreased dose of Lasix, could consider renal biopsy. His Prograf level of 5.7 on 12/23/2018 is a 12 hour trough and within goal of 6-8. Will continue current immunosuppression. He remains on a reduced dose of CellCept 250mg  BID with history of diarrhea and malignancy.    2. Fluid overload and HFrEF. Patient was admitted at the beginning of November 2019 for acute on chronic CHF exacerbation, felt to be secondary to high output from his large AVF. He underwent underwent fistula banding, but unfortunately his output remained high. At this point I think he should just have the fistula ligated, and patient is amenable to that. For now, will hold diuretics as above and restart at a lower dose. He will continue to weigh himself daily. He is probably below his dry weight right now, as he is down to 153 pounds with a previous dry weight probably closer to 160 pounds. Will have his coordinator check on him next week to review his weights.   Dr. Norma Fredrickson, to see if we can tie off the access in his right arm.     3. Right renal cell carcinoma status post nephrectomy. Imaging previously showed hepatic mass that was biopsied and consistent with inflammatory process. MRI 05/20/2018 remains reassuring, surgery did not recommend any additional diagnostic workup, will just repeat MRI in one year (05/2019).    4. Hypertension. In clinic today (12/28/2018) at 136/82. No changes made to current antihypertensive regimen at this time. Suspect will be better once we are able  to restart lisinopril, which I will not do now in the setting of AKI.    5. New onset a-fib. Rate controlled on current dose of metoprolol. He is not currently on anticoagulation given history of thrombocytopenia. Cardiology is making that decision.     6. Hypothyroidism. TSH improved but remains slightly elevated at 7.93 on 10/18/18, down from 16.4 on 09/21/18. T3 low at 0.7. Will continue Synthroid at 250 mcg daily.    7. Hypomagnesemia. Magnesium normal today at 1.7 (12/28/2018). Has had diarrhea with higher levels of supplementation in the past. Tolerating 400 mg BID. Continue to encourage ingestion of high magnesium foods (educational information given in the past).    8. Proteinuria.  UP/C peaked at 1.5 on 05/20/2018, down to 0.37 at last visit (11/22/18), even off lisinopril. Pending today ***. Renal biopsy on 12/30/17 showed immune complex mediated glomerulopathy that may be consistent with minor recurrence of lupus nephritis. Do not feel comfortable pushing Cellcept dose higher given diarrhea in the past.     9. Health maintenance. Patient has received 2 doses shingrix. He received flu shot in clinic 09/2018.    10. Will see patient back for scheduled appointment in 4 mo or sooner if needed. Will follow labs closely.      Scribe's Attestation: Lisbeth Ply, MD obtained and performed the history, physical exam and medical decision making elements that were entered into the chart.  Signed by Rolan Lipa, Scribe, on December 28, 2018 3:50 PM

## 2018-12-28 NOTE — Unmapped (Signed)
Saw patient in clinic, reports he is doing well.    Weight is stable, no swelling, no SOB    Denies CP, abdominal pain, n/v/d, urinary problems, fevers, or tremors      Labs today, took prograf at 11pm

## 2018-12-29 LAB — CMV QUANT LOG10: Lab: 0

## 2018-12-29 LAB — CMV DNA, QUANTITATIVE, PCR

## 2018-12-29 LAB — TACROLIMUS, TROUGH: Lab: 5.7

## 2019-01-02 NOTE — Unmapped (Signed)
Transplant Nephrology Clinic Visit      History of Present Illness    Patient is a 68 y.o. male who underwent deceased donor transplant on 13-Jun-2007 secondary to lupus nephritis. His post transplant course has been complicated by early cellular rejection treated with Thymoglobulin. At that time, he was enrolled in the JAK-3 inhibitor trial and was taken off study drug. Additionally, he was noted to have a large pericardial effusion of greater than 1 L in 08/2007. At that time his TSH was 258. This improved with pericardiocentesis and thyroid replacement and has not reaccumulated. In 2009, he had coronary artery disease which required stenting. Patient does not have evidence of donor specific antibodies. His creatinine was between 1.5 and 2.2, but has been more in the low to mid 2s since his right native nephrectomy on 02/20/14 for renal cell carcinoma.  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 No evidence of DSA antibodies as of 05/20/2018. Most recent baseline creatinine while hospitalized was 2.6-2.8, had been around 2.2-2.6 immediately prior to that.    He was admitted to [facility] from 9/15 through 08/31/2016 after presenting with fever and leukocytosis. His initial urine culture specimen was lost, but he was treated for presumptive UTI. He improved clinically on antibiotics. During that evaluation he had a CT of the abdomen on 08/29/2016 that showed New indeterminate liver lesions concerning for metastatic disease versus post transplant lymphoproliferative disorder versus abscesses. He then underwent MRI on 9/18 which showed Two liver lesions with minimal enhancement. Differential includes post transplant lymphoproliferative disorder, sequelae of prior infection or hematoma, less likely acute abscess. Metastases unlikely given enhancement characteristics. EBV was negative. As an outpatient he then underwent a PET scan on 09/14/2016 which showed Enlarging right hepatic lobe lesion with intense FDG uptake and central area of photopenia, concerning for necrotic metastasis, less likely abscess. Stable right hepatic dome lesion with intense FDG uptake, concerning for additional metastasis. After consult with radiology, they felt the best approach was ultrasound guided biopsy which he had on 10/01/2016. Pathology revealed Fragments of liver tissue with parenchymal extinction, mixed inflammatory infiltrates, and reactive-appearing stromal changes (see comment), - Increased iron staining within adjacent intact hepatic parenchyma (grade 3 of 4), - Sinusoidal congestion. The differential diagnosis includes sequelae from an abscess (e.g., inflammatory pseudotumor) versus nonspecific inflammatory changes adjacent to an unsampled mass. Correlation with the radiologic findings and clinical follow up are suggested.    The patient had a repeat MRI on 02/01/17 to visualize lesions seen on MRI in September. His repeat MRI revealed interval moderate decrease in the size of previously detected liver lesions, which could represent the sequelae of resolving right hepatic lobe infection with interval decrease in size of large right hepatic lobe lesion with internal T1 hyperintensity likely reflecting proteinacious material and mild internal and perilesional enhancement. Interval decrease in size and conspicuity of small hepatic dome lesion is also noted and this could also represent a resolving abscess. They recommended a re-scan in approximately 3 months (04/2017). I reviewed the report and images with Dr. Rush Barer who agreed that the lesion continues to look like resolving infection.     Patient underwent renal biopsy on 12/30/2017 to evaluate proteinuria which had increased over the last year from 0.75 to 1.4. 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 no proliferative glomerular lesions. These changes can indicate a minor recurrence of the lupus nephritis not carrying any direct therapeutic and prognostic significance at the present time.); Tubular pigment deposits, consistent with lipofuscin.    He was admitted 3/1 through 02/14/18 with RSV infection, AKI and new diastolic and systolic heart failure. TTE showed moderate EF 40-45%, reduced from 52% reported in January 2015. Patient's home lasix was increased to 40 mg qd from 20 mg qd with improvement in symptoms. Creatinine peaked at 2.73, 2.69 on discharge. It was suspected that patient's RSV infection worsened his heart failure and caused AKI is due to reduced perfusion. Patient's enalapril was held on discharge.     He was admitted 11/4 through 10/21/18 after presenting with dyspnea secondary to acute on chronic systolic heart failure. Echo showed globally, mildly decreased EF of 45% and grade III diastolic dysfunction. Patient was diuresed with lasix and discharged home on 80 mg bid. He was also found to have 12 L/min flow on PVL fistula study and underwent fistula banding to prevent worsening cardiac function from high output heart failure. Creatinine was elevated at time of admission to 3 but downtrended with diuresis to 2.3-2.8. Tac dose was decreased to 3 mg qAM and 2 mg qPM. No other changes to immunosuppression. Enalapril held due to kidney dysfunction.     At surgery visit on 11/17/18 to discuss fistula ligation (as flow post banding remained at 10 L/min), EKG was ordered and patient noted to be in a-fib. Was able to see cardiology that afternoon. No episodes of a-fib while monitored in the hospital and no prior history. Patient asymptomatic and hemodynamically stable. Rate controlled on metoprolol 100 mg daily, not started on anticoagulation at that time.      Interval history since last visit 11/22/2018    At his last visit, his creatinine was elevated to 3.27 with BUN 108. He was euvolemic on exam, so we held his Lasix for 5 doses, and restarted lasix at 40 mg BID (from 80 mg BID). His coordinator called him on 12/12 and he did not have symptoms of volume overload. On repeat labs 12/17, creatinine was down to 2.87, BUN 95. On 1/10 creatinine 2.82 and BUN 83.    He presents today for follow up. Overall he has felt quite well, his weight has been stable on the current dose of lasix. He denies SOB, PND/orthopnea, no LE edema. No chest pain or palpitations.     Review of Systems    Otherwise on review of systems patient denies fever or chills, chest pain, SOB, PND, orthopnea or edema. No N/V/abdominal pain. No current diarrhea. No dysuria, hematuria or difficulty voiding. All other systems are reviewed and are negative.    Medications    Current Outpatient Medications   Medication Sig Dispense Refill   ??? acyclovir (ZOVIRAX) 400 MG tablet TAKE (1) TABLET BY MOUTH EVERY DAY 90 tablet 3   ??? allopurinol (ZYLOPRIM) 100 MG tablet Take 1.5 tablets (150 mg total) by mouth daily. 135 tablet 3   ??? aspirin (ASPIRIN LOW-STRENGTH) 81 MG chewable tablet Chew 81 mg daily.     ??? atorvastatin (LIPITOR) 20 MG tablet Take 1 tablet (20 mg total) by mouth daily. 90 tablet 3   ??? calcitriol (ROCALTROL) 0.25 MCG capsule Take 1 capsule (0.25 mcg total) by mouth Every Monday, Wednesday, and Friday. 36 capsule 3   ??? CELLCEPT 250 mg capsule TAKE 1 CAPSULE BY  MOUTH TWICE DAILY 180 each 3   ??? colchicine (COLCRYS) 0.6 mg tablet Take 1 tablet (0.6 mg total) by mouth daily as needed. for up to 1 dose (Patient not taking: Reported on 11/17/2018) 30 tablet 11   ??? ferrous sulfate 325 (65 FE) MG tablet Take 1 tablet (325 mg total) by mouth daily.  0   ??? fluticasone (FLONASE) 50 mcg/actuation nasal spray 1 spray into each nostril daily as needed.     ??? furosemide (LASIX) 40 MG tablet Take 1 tablet (40 mg total) by mouth Two (2) times a day. 180 tablet 3   ??? hydrocortisone 2.5 % ointment Only when needed in front of ear; can used more broadly on lower legs if needed for itching. (Patient not taking: Reported on 11/17/2018) 453 g 1   ??? levothyroxine (SYNTHROID) 200 MCG tablet Take 1 tab with two tab, total dose daily 90 tablet 3   ??? levothyroxine (SYNTHROID, LEVOTHROID) 25 MCG tablet Take 2 tabs with one tab, total dose daily 90 tablet 3   ??? magnesium oxide (MAG-OX) 400 mg (241.3 mg magnesium) tablet Take 1 tablet (400 mg total) by mouth Two (2) times a day. 180 tablet 3   ??? metoprolol succinate (TOPROL-XL) 100 MG 24 hr tablet Take 1 tablet (100 mg total) by mouth daily. 90 tablet 3   ??? omeprazole (PRILOSEC) 20 MG capsule Take 1 capsule (20 mg total) by mouth daily. 90 capsule 3   ??? predniSONE (DELTASONE) 5 MG tablet Take 1 tablet (5 mg total) by mouth daily. 90 tablet 3   ??? PROGRAF 1 mg capsule TAKE 3 CAPSULES (3MG ) BY MOUTH IN THE MORNING AND 2 CAPSULES (2MG ) BY MOUTH IN THE EVENING. 180 capsule 11   ??? simethicone (GAS-X) 80 MG chewable tablet Chew 1 tablet (80 mg total) every six (6) hours as needed for flatulence. 30 tablet 1   ??? triamcinolone (KENALOG) 0.1 % ointment Apply topically Two (2) times a day. For only 2 week BID topically from itchy area in front of ears. 80 g 1     No current facility-administered medications for this visit.        Physical Exam    BP 136/82 (BP Site: L Arm, BP Position: Sitting, BP Cuff Size: Large)  - Pulse 56  - Temp 36.5 ??C (97.7 ??F) (Temporal)  - Resp 20  - Wt 73.9 kg (163 lb)  - BMI 22.10 kg/m??    General: Patient is a pleasant male in no apparent distress.  Appears younger than stated age.  Eyes: Sclera anicteric.  ENT: No erythema or exudate.  Neck: Supple without LAD/JVD/bruits.  Lungs: Clear to auscultation bilaterally, no wheezes/rales/rhonchi.  Cardiovascular: Irregularly irregular rate and rhythm without murmurs, rubs or gallops.  Abdomen: no distension, soft, nontender. Bowel sounds present. No tenderness over the graft.  Extremities: Extremities without edema. Joints without evidence of synovitis.   Skin: Without rash.  Neurological: Grossly nonfocal.  Psychiatric: Mood and affect appropriate.    Laboratory Results    Recent Results (from the past 170 hour(s))   Basic Metabolic Panel    Collection Time: 12/23/18 12:30 PM   Result Value Ref Range    Sodium 140 mmol/L    Potassium 4.1 mmol/L    Chloride 106 mmol/L    CO2 26.0 mmol/L    BUN 83 (H) mg/dL    Creatinine 1.61 (H) mg/dL    Glucose 096 (H) mg/dL    Calcium 9.7 mg/dL  EGFR MDRD Non Af Amer      EGFR MDRD Af Amer 26 (L) mL/min/1.54m2   Magnesium Level    Collection Time: 12/23/18 12:30 PM   Result Value Ref Range    Magnesium 2.0 mg/dL   Phosphorus Level    Collection Time: 12/23/18 12:30 PM   Result Value Ref Range    Phosphorus 2.7 mg/dL   CBC w/ Differential    Collection Time: 12/23/18 12:30 PM   Result Value Ref Range    Results Verified by Slide Scan      WBC 5.9 10*9/L    WBC      RBC 3.95 (L) 10*12/L    HGB 11.5 (L) g/dL    HCT 81.1 (L) %    MCV 94.2 fL    MCH 29.1 pg    MCHC 30.9 g/dL    RDW 91.4 %    MPV      Platelet 101 (L) 10*9/L    nRBC 0 /100 WBCs    Neutrophils % 65.0 %    Lymphocytes % 22.0 %    Monocytes % 11.0 %    Eosinophils % 2.0 %    Basophils % 0.0 %    Absolute Neutrophils 3.8 10*9/L    Absolute Lymphocytes 1.3 10*9/L    Absolute Monocytes 0.7 10*9/L    Absolute Eosinophils 0.1 10*9/L    Absolute Basophils 0.0 10*9/L    Microcytosis      Macrocytosis      Anisocytosis      Hyperchromasia      Hypochromasia     Tacrolimus Level, Trough    Collection Time: 12/23/18 12:30 PM   Result Value Ref Range    Tacrolimus, Trough 5.7 5.0 - 15.0 ng/mL   Basic Metabolic Panel    Collection Time: 12/28/18  2:01 PM   Result Value Ref Range    Sodium 140 135 - 145 mmol/L    Potassium 4.3 3.5 - 5.0 mmol/L    Chloride 104 98 - 107 mmol/L    CO2 26.0 22.0 - 30.0 mmol/L    Anion Gap 10 7 - 15 mmol/L    BUN 70 (H) 7 - 21 mg/dL    Creatinine 7.82 (H) 0.70 - 1.30 mg/dL    BUN/Creatinine Ratio 26     EGFR CKD-EPI Non-African American, Male 24 (L) >=60 mL/min/1.63m2    EGFR CKD-EPI African American, Male 28 (L) >=60 mL/min/1.29m2    Glucose 118 70 - 179 mg/dL    Calcium 9.8 8.5 - 95.6 mg/dL   Magnesium Level    Collection Time: 12/28/18  2:01 PM   Result Value Ref Range    Magnesium 1.7 1.6 - 2.2 mg/dL   Phosphorus Level    Collection Time: 12/28/18  2:01 PM   Result Value Ref Range    Phosphorus 3.2 2.9 - 4.7 mg/dL   CBC w/ Differential    Collection Time: 12/28/18  2:01 PM   Result Value Ref Range    WBC 5.0 4.5 - 11.0 10*9/L    RBC 3.79 (L) 4.50 - 5.90 10*12/L    HGB 10.8 (L) 13.5 - 17.5 g/dL    HCT 21.3 (L) 08.6 - 53.0 %    MCV 96.0 80.0 - 100.0 fL    MCH 28.5 26.0 - 34.0 pg    MCHC 29.7 (L) 31.0 - 37.0 g/dL    RDW 57.8 46.9 - 62.9 %    MPV 9.7 7.0 - 10.0 fL    Platelet  100 (L) 150 - 440 10*9/L    Neutrophils % 76.2 %    Lymphocytes % 10.2 %    Monocytes % 8.0 %    Eosinophils % 2.6 %    Basophils % 0.4 %    Absolute Neutrophils 3.8 2.0 - 7.5 10*9/L    Absolute Lymphocytes 0.5 (L) 1.5 - 5.0 10*9/L    Absolute Monocytes 0.4 0.2 - 0.8 10*9/L    Absolute Eosinophils 0.1 0.0 - 0.4 10*9/L    Absolute Basophils 0.0 0.0 - 0.1 10*9/L    Large Unstained Cells 3 0 - 4 %    Macrocytosis Slight (A) Not Present    Hypochromasia Marked (A) Not Present   Morphology Review    Collection Time: 12/28/18  2:01 PM   Result Value Ref Range    Smear Review Comments See Comment (A) Undefined   Urinalysis    Collection Time: 12/28/18  2:27 PM   Result Value Ref Range    Color, UA Colorless     Clarity, UA Clear     Specific Gravity, UA 1.009 1.003 - 1.030    pH, UA 5.0 5.0 - 9.0    Leukocyte Esterase, UA Negative Negative    Nitrite, UA Negative Negative    Protein, UA Trace (A) Negative    Glucose, UA Negative Negative    Ketones, UA Negative Negative    Urobilinogen, UA 0.2 mg/dL 0.2 mg/dL, 1.0 mg/dL    Bilirubin, UA Negative Negative    Blood, UA Moderate (A) Negative    RBC, UA <1 <=3 /HPF    WBC, UA <1 <=2 /HPF    Squam Epithel, UA <1 0 - 5 /HPF    Bacteria, UA None Seen None Seen /HPF   Protein/Creatinine Ratio, Urine    Collection Time: 12/28/18  2:27 PM   Result Value Ref Range    Creat U 44.1 Undefined mg/dL    Protein, Ur 16.1 Undefined mg/dL    Protein/Creatinine Ratio, Urine 0.469 Undefined       Assessment and Plan    1. Status post renal transplant. His creatinine today is within his most recent baseline, at 2.67 with BUN 70. I suspect he will remain at this baseline due to the need for ongoing Lasix. A Prograf level was not sent today, but his Prograf level of 5.7 on 12/23/2018 is a 12 hour trough and close to goal of 6-8. Will continue current immunosuppression. He remains on a reduced dose of CellCept 250mg  BID with history of diarrhea and malignancy.    2. Fluid overload and HFrEF. Patient was admitted at the beginning of November 2019 for acute on chronic CHF exacerbation, felt to be secondary to high output from his large AVF. He underwent underwent fistula banding, but unfortunately his output remained high. At this point I think he should just have the fistula ligated, and patient is amenable to that. Will refer back to Dr. Norma Fredrickson for consideration of that. Stable on current diuretic dose, will continue for now.     3. Right renal cell carcinoma status post nephrectomy. Imaging previously showed hepatic mass that was biopsied and consistent with inflammatory process. MRI 05/20/2018 remains reassuring, surgery did not recommend any additional diagnostic workup, will just repeat MRI in one year (05/2019).    4. Hypertension. Reasonable in clinic today at 136/82. No changes made to current antihypertensive regimen at this time. Suspect will be better once we are able to restart lisinopril, would consider at next visit if  creatinine remains stable.    5. New onset a-fib. Rate controlled on current dose of metoprolol. He is not currently on anticoagulation given history of thrombocytopenia. Cardiology is managing.     6. Hypothyroidism. TSH improved but remains slightly elevated at 7.93 on 10/18/18, down from 16.4 on 09/21/18. T3 low at 0.7. Will continue Synthroid at 250 mcg daily. Check thyroid labs at next visit.    7. Hypomagnesemia. Magnesium normal today at 1.7. Has had diarrhea with higher levels of supplementation in the past. Tolerating 400 mg BID. Continue to encourage ingestion of high magnesium foods (educational information given in the past).    8. Proteinuria.  UP/C peaked at 1.5 on 05/20/2018, down to 0.37 at last visit (11/22/18), and today is 0.46, even off lisinopril. Renal biopsy on 12/30/17 showed immune complex mediated glomerulopathy that may be consistent with minor recurrence of lupus nephritis. Do not feel comfortable pushing Cellcept dose higher given diarrhea in the past.     9. Health maintenance. Patient has received 2 doses shingrix. He received flu shot in clinic 09/2018.    10. Will see patient back f in 4 months, or sooner if needed. Will follow labs closely.      Scribe's Attestation: Lisbeth Ply, MD obtained and performed the history, physical exam and medical decision making elements that were entered into the chart.  Signed by Rolan Lipa, Scribe, on December 28, 2018 3:50 PM    ----------------------------------------------------------------------------------------------------------------------  January 02, 2019 5:04 PM. Documentation assistance provided by the Scribe. I was present during the time the encounter was recorded. The information recorded by the Scribe was done at my direction and has been reviewed and validated by me.  ----------------------------------------------------------------------------------------------------------------------

## 2019-01-04 NOTE — Unmapped (Signed)
[facility] Refill Coordination Note    Specialty Medication(s) to be Shipped:   Transplant: Prograf 1mg   **[facility] from [name], Confirmed with patient**     Miguel Ward, DOB: 1951-04-01  Phone: [phone] (home)     All above HIPAA information was verified with patient.     Completed refill call assessment today to schedule patient's medication shipment from the [facility] [phone]).       Specialty medication(s) and dose(s) confirmed: Regimen is correct and unchanged.   Changes to medications: Lena reports no changes reported at this time.  Changes to insurance: No  Questions for the pharmacist: No    The patient will receive a drug information handout for each medication shipped and additional FDA Medication Guides as required.      DISEASE/MEDICATION-SPECIFIC INFORMATION        N/A    ADHERENCE     Medication Adherence    Patient reported X missed doses in the last month:  0  Specialty Medication:  Prograf 1mg    Patient is on additional specialty medications:  No  Patient is on more than two specialty medications:  No  Any gaps in refill history greater than 2 weeks in the last 3 months:  no  Demonstrates understanding of importance of adherence:  yes  Informant:  patient  Reliability of informant:  reliable      Adherence tools used:  patient uses a pill box to manage medications, calendar          Confirmed plan for next specialty medication refill:  delivery by pharmacy          Refill Coordination    Has the Patients' Contact Information Changed:  No  Is the Shipping Address Different:  No         MEDICARE PART B DOCUMENTATION     Prograf 1mg : Patient has 11 days worth of capsules on hand.    SHIPPING     Shipping address confirmed in Epic.     Delivery Scheduled: Yes, Expected medication delivery date: 01/13/2019 via UPS or courier.     Medication will be delivered via UPS to the home address in Epic [place].    Yer Castello P Allena Katz   [facility] Specialty Technician

## 2019-01-05 NOTE — Unmapped (Signed)
Vascular Access for Dialysis Note:  Miguel Ward, DOB: 01-05-1951     Nephrologist: Miguel Chou, MD. Last saw on 12/28/18. From her note: Fluid overload and HFrEF. Patient was admitted at the beginning of November 2019 for acute on chronic CHF exacerbation, felt to be secondary to high output from his large AVF. He underwent underwent fistula banding, but unfortunately his output remained high. At this point I think he should just have the fistula ligated, and patient is amenable to that. Will refer back to Miguel. Norma Ward for consideration of that. Stable on current diuretic dose, will continue for now.     IBMsg to Miguel Miguel Ward asking if we schedule for OR or if patient needs a clinic visit first.     11/17/18 Saw Miguel Miguel Ward and Miguel Miguel Ward fistula okay from their perspective as long as heart is okay; pt sees Miguel Miguel Ward 12/28/18; if she has other opinion we will f/u. Ordered EKG during clinic visit due to reported irregular heart rate; a-fibrillation on ECG. HR 58. No previously reported atrial fibrillation. Pt saw Miguel Ward, Cardiologist, at [facility] on 10/27/18. Will refer pt to Miguel. Willa Ward for follow up.     D/c from clinic, asymptomatic, ambulating, self.     Called Miguel Miguel Ward' office [phone] and they were able to schedule him for 4:20 pm today, 11/17/18. Called patient and he says he can make that appointment. He had no further questions or concerns.

## 2019-01-12 MED FILL — PROGRAF 1 MG CAPSULE: 30 days supply | Qty: 150 | Fill #2

## 2019-01-12 MED FILL — PROGRAF 1 MG CAPSULE: 30 days supply | Qty: 150 | Fill #2 | Status: AC

## 2019-01-16 NOTE — Unmapped (Signed)
Vascular Access for Dialysis Note:  JP EASTHAM, DOB: February 20, 1951     01/16/19 Spoke w/pt via phone to discuss scheduling surgery. He agreed to OR date 02/07/2019. Request sent to schedule Right upper extremity AVF revision, banding, possible interposition graft placement, possible ligation.  No pre-care needed. Consent signed and scanned on 10/24/18.  Pre-op letter sent to patient.   Echo 10/19/18    Nephrologist: Alvester Chou, MD. Last saw on 12/28/18. From her note: Fluid overload and HFrEF. Patient was admitted at the beginning of November 2019 for acute on chronic CHF exacerbation, felt to be secondary to high output from his large AVF. He underwent underwent fistula banding, but unfortunately his output remained high. At this point I think he should just have the fistula ligated, and patient is amenable to that. Will refer back to Dr. Norma Fredrickson for consideration of that. Stable on current diuretic dose, will continue for now.       11/17/18 Saw Dr Lucianne Muss and Dr Norma Fredrickson fistula okay from their perspective as long as heart is okay; pt sees Dr Kirtland Bouchard True 12/28/18; if she has other opinion we will f/u. Ordered EKG during clinic visit due to reported irregular heart rate; a-fibrillation on ECG. HR 58. No previously reported atrial fibrillation. Pt saw Dr Danne Harbor, Cardiologist, at [facility] on 10/27/18. Will refer pt to Dr. Willa Rough for follow up.     D/c from clinic, asymptomatic, ambulating, self.     Called Dr Willa Rough' office [phone] and they were able to schedule him for 4:20 pm today, 11/17/18. Called patient and he says he can make that appointment. He had no further questions or concerns.

## 2019-01-20 MED ORDER — ALLOPURINOL 100 MG TABLET
0 refills | 0 days | Status: CP
Start: 2019-01-20 — End: ?

## 2019-01-20 MED ORDER — CALCITRIOL 0.25 MCG CAPSULE
0 refills | 0 days | Status: CP
Start: 2019-01-20 — End: ?

## 2019-01-20 MED ORDER — PREDNISONE 5 MG TABLET
0 refills | 0 days | Status: CP
Start: 2019-01-20 — End: 2019-06-26

## 2019-01-20 MED ORDER — ATORVASTATIN 20 MG TABLET
0 refills | 0 days | Status: CP
Start: 2019-01-20 — End: 2019-05-16

## 2019-02-02 NOTE — Unmapped (Signed)
[facility] Refill Coordination Note    Specialty Medication(s) to be Shipped:   Transplant: Prograf 1mg   **Confirmed with patient, getting Cellcept from Mfg.**     Miguel Ward, DOB: 1951/03/01  Phone: [phone] (home)     All above HIPAA information was verified with patient.     Completed refill call assessment today to schedule patient's medication shipment from the [facility] [phone]).       Specialty medication(s) and dose(s) confirmed: Regimen is correct and unchanged.   Changes to medications: Yaziel reports no changes reported at this time.  Changes to insurance: No  Questions for the pharmacist: No    The patient will receive a drug information handout for each medication shipped and additional FDA Medication Guides as required.      DISEASE/MEDICATION-SPECIFIC INFORMATION        N/A    ADHERENCE     Medication Adherence    Patient reported X missed doses in the last month:  0  Specialty Medication:  Cellcept 250mg  & Prograf 1mg   Patient is on additional specialty medications:  No  Patient is on more than two specialty medications:  No  Any gaps in refill history greater than 2 weeks in the last 3 months:  no  Demonstrates understanding of importance of adherence:  yes  Informant:  patient  Reliability of informant:  reliable  Adherence tools used:  patient uses a pill box to manage medications, calendar  Confirmed plan for next specialty medication refill:  delivery by pharmacy          Refill Coordination    Has the Patients' Contact Information Changed:  No  Is the Shipping Address Different:  No         MEDICARE PART B DOCUMENTATION     Prograf 1mg : Patient has 12 days worth of capsules on hand.    SHIPPING     Shipping address confirmed in Epic.     Delivery Scheduled: Yes, Expected medication delivery date: 02/14/2019 via UPS or courier.     Medication will be delivered via UPS to the home address in Epic [place].    Audra Kagel P Allena Katz   [facility] Specialty Technician

## 2019-02-06 DIAGNOSIS — N186 End stage renal disease: Principal | ICD-10-CM

## 2019-02-06 NOTE — Unmapped (Signed)
Medication questions for day of surgery given per anesthesia guidelines. Pt states he understands.

## 2019-02-07 ENCOUNTER — Ambulatory Visit: Admit: 2019-02-07 | Discharge: 2019-02-07 | Payer: MEDICARE

## 2019-02-07 ENCOUNTER — Encounter: Admit: 2019-02-07 | Discharge: 2019-02-07 | Payer: MEDICARE | Attending: Anesthesiology | Primary: Anesthesiology

## 2019-02-07 DIAGNOSIS — N186 End stage renal disease: Principal | ICD-10-CM

## 2019-02-07 MED ORDER — OXYCODONE 5 MG TABLET
ORAL_TABLET | Freq: Four times a day (QID) | ORAL | 0 refills | 0.00000 days | Status: CP | PRN
Start: 2019-02-07 — End: 2019-04-27

## 2019-02-07 MED ORDER — DOCUSATE SODIUM 100 MG CAPSULE
ORAL_CAPSULE | Freq: Two times a day (BID) | ORAL | 0 refills | 0.00000 days | Status: CP
Start: 2019-02-07 — End: 2019-02-12

## 2019-02-07 NOTE — Unmapped (Signed)
Date of Surgery: 02/07/2019  ??  Pre-op Diagnosis: esrd with high output AVF  ??  Post-op Diagnosis: same  ??  Procedure(s):  REVISON, OPEN, ARTERIOVENOUS FISTULA; WITHOUT THROMBECTOMY, AUTOGENOUS OR NONAUTOGENOUS DIALYSIS GRAFT: 16109 (CPT??)   ??  Revision/banding of RUE AVF  ??  Note: 6mm ID bovine carotid graft used as a wrap at origin of AVF for 2cm as banding; flow significantly diminished by doppler and physical exam, AVf soft after banding procedure  ??  Performing Service: Transplant  Surgeon(s) and Role:     * Leona Carry, MD - Primary     * Norton Pastel - Resident - Assisting  ??  Assistant: None  ??  Findings: Significantly dilated RUE AVF with successful banding of inflow to 5 cm or less      Anesthesia: General  ??  Estimated Blood Loss: 10 mL  ??  Complications: None  ??  Specimens: None collected    ??  Implants:   Implant Name Type Inv. Item Serial No. Manufacturer Lot No. LRB No. Used   GRAFT BOVINE CAROTID ARTERY ARTEGRAF 6X15 - [id number] Graft GRAFT BOVINE CAROTID ARTERY ARTEGRAF 6X15 ?? ARTEGRAFT [id number] Right 1   ??  ??  Procedure:  After consent obained the patient brought to OR.   Timeout.  GA induced d/t unable to block d/t vasculature.    Arm prepped and draped.  Second timeout.  Incision over inflow of AVF in right arm.  Anastomosis appeared to be a proximal radial artery AVF just below ACF.  A 2-3 cm segment of 6mm Artegraft was wraped around the proximal AVF near a prior attempt at [employer].    It was secured with interrupted 5-0 prolene and secured to the wall of the AVF to avoid slipping.  Flow still present in VAf but diminished.  Good hemostasis.  Wound closed with 3-o vicryl in  dermisand 4-0 in skin.    Surgeon Notes: Dr. Norma Fredrickson was present and scrubbed for the entire procedure

## 2019-02-07 NOTE — Unmapped (Signed)
TRANSPLANT SURGERY PROGRESS NOTE  ??  Assessment and Plan  Miguel Ward??is a 68 y.o.??male??w/ PMHx of ESRD 2/2 lupus nephritis (s/p DDKT??on 06/11/2007, RCC (s/p R renal nephrectomy (02/20/2014)), CAD, HTN,??prior??pericardial effusion, hypothyroidism, and gout??who presented to [facility] with dyspnea and anasarca concerning for heart failure exacerbation.??  He is a k/c/o CAD s/p PCI in  2009. He was admitted with Fluid overloads/t High output heart failure. He was found to have a hight output RUE AVF which had a flow of 12 L/min across it. Patient was diuresed by nephrology and consulted [place] for the high output fistula. Patient underwent RUE AVF banding on 10/21/2018.   ??  - Plan to follow up with nephrologist to evaluate his current status after fistula banding and the need for a total ligation in case if high output heart failure symptoms worsen.  - Patient was found to have an irregularly irregular pulse and on EKG was seen to have Atrial Flutter. Advised to visit his cardiologist for an opinion.   ??  ??  ??  Subjective  Miguel Ward??is a 68 y.o.??male??w/ PMHx of ESRD 2/2 lupus nephritis (s/p DDKT??on 06/11/2007, RCC (s/p R renal nephrectomy (02/20/2014)), CAD, HTN,??prior??pericardial effusion, hypothyroidism, and gout??who presented to [facility] with dyspnea and anasarca concerning for heart failure exacerbation.  Patient underwent RUE AVF banding on 10/21/2018.   He is asymptomatic at present. He denies any chest pain, SOB, fever, pain, weakness, tingling, numbness. He has lost weight after putting out fluids by diuresing.  ??  Objective  ??  Vitals       Vitals:   ?? 11/17/18 1043   BP: 143/80   Pulse: 96   Temp: 36 ??C (96.8 ??F)   TempSrc: Tympanic   Weight: 69.9 kg (154 lb 3.2 oz)   Height: 182.9 cm (6' 0.01)         Body mass index is 20.91 kg/m??.   ??  Physical Exam:  ??  General Appearance:   No acute distress  Lungs:                          Clear to auscultation bilaterally  Heart:                           Regular rate and rhythm Abdomen:                     Soft, non-tender, non-distended  Extremities:                  RUE: incision has healed, no redness or discharge, the size                           of the dilated fistula has decreased, flow through the fistula has                                  decreased, thrill palpable, radial pulse 2+, irregularly irregular radial pulse  ??  ??  ??  Data Review:  All lab results last 24 hours:    Recent Results   No results found for this or any previous visit (from the past 24 hour(s)).     ??  ??  EKG dated 11/17/2018 s/o Atrial Flutter

## 2019-02-07 NOTE — Unmapped (Signed)
Brief Operative Note  (CSN: [id number])      Date of Surgery: 02/07/2019    Pre-op Diagnosis: esrd with high output AVF    Post-op Diagnosis: same    Procedure(s):  REVISON, OPEN, ARTERIOVENOUS FISTULA; WITHOUT THROMBECTOMY, AUTOGENOUS OR NONAUTOGENOUS DIALYSIS GRAFT: 82956 (CPT??)     Revision/banding of RUE AVF    Note: Revisions to procedures should be made in chart - see Procedures activity.    Performing Service: Transplant  Surgeon(s) and Role:     * Leona Carry, MD - Primary     * Norton Pastel - Resident - Assisting    Assistant: None    Findings: Significantly dilated RUE AVF with successful banding of inflow to 5 cm    Anesthesia: General    Estimated Blood Loss: 10 mL    Complications: None    Specimens: None collected    Implants:   Implant Name Type Inv. Item Serial No. Manufacturer Lot No. LRB No. Used   GRAFT BOVINE CAROTID ARTERY ARTEGRAF 6X15 - [id number] Graft GRAFT BOVINE CAROTID ARTERY ARTEGRAF 6X15  ARTEGRAFT [id number] Right 1       Surgeon Notes: Dr. Norma Fredrickson was present and scrubbed for the entire procedure    Norton Pastel, MD  Date: 02/07/2019  Time: 9:17 AM

## 2019-02-07 NOTE — Unmapped (Signed)
TRANSPLANT SURGERY PROGRESS NOTE  ??  Assessment and Plan  Arsenio T Bodner??is a 68 y.o.??male??w/ PMHx of ESRD 2/2 lupus nephritis (s/p DDKT??on 06/11/2007, RCC (s/p R renal nephrectomy (02/20/2014)), CAD, HTN,??prior??pericardial effusion, hypothyroidism, and gout??who presented to [facility] with dyspnea and anasarca concerning for heart failure exacerbation.??  He is a k/c/o CAD s/p PCI in  2009. He was admitted with Fluid overloads/t High output heart failure. He was found to have a hight output RUE AVF which had a flow of 12 L/min across it. Patient was diuresed by nephrology and consulted [place] for the high output fistula. Patient underwent RUE AVF banding on 10/21/2018.   ??  - Plan to follow up with nephrologist to evaluate his current status after fistula banding and the need for a total ligation in case if high output heart failure symptoms worsen.  - Patient was found to have an irregularly irregular pulse and on EKG was seen to have Atrial Flutter. Advised to visit his cardiologist for an opinion.   ??  ??  ??  Subjective  Andrei T Fenn??is a 68 y.o.??male??w/ PMHx of ESRD 2/2 lupus nephritis (s/p DDKT??on 06/11/2007, RCC (s/p R renal nephrectomy (02/20/2014)), CAD, HTN,??prior??pericardial effusion, hypothyroidism, and gout??who presented to [facility] with dyspnea and anasarca concerning for heart failure exacerbation.  Patient underwent RUE AVF banding on 10/21/2018.   He is asymptomatic at present. He denies any chest pain, SOB, fever, pain, weakness, tingling, numbness. He has lost weight after putting out fluids by diuresing.  ??  Objective  ??  Vitals       Vitals:   ?? 11/17/18 1043   BP: 143/80   Pulse: 96   Temp: 36 ??C (96.8 ??F)   TempSrc: Tympanic   Weight: 69.9 kg (154 lb 3.2 oz)   Height: 182.9 cm (6' 0.01)         Body mass index is 20.91 kg/m??.   ??  Physical Exam:  ??  General Appearance:   No acute distress  Lungs:                          Clear to auscultation bilaterally  Heart:                           Regular rate and rhythm Abdomen:                     Soft, non-tender, non-distended  Extremities:                  RUE: incision has healed, no redness or discharge, the size                           of the dilated fistula has decreased, flow through the fistula has                                  decreased, thrill palpable, radial pulse 2+, irregularly irregular radial pulse  ??  ??  ??  Data Review:  All lab results last 24 hours:    Recent Results   No results found for this or any previous visit (from the past 24 hour(s)).     ??  ??  EKG dated 11/17/2018 s/o Atrial Flutter

## 2019-02-07 NOTE — Unmapped (Signed)
If you experience any of the above symptoms, please call the surgeon's office Monday through Friday (8 am to 5 pm) at [phone]  If you experience any of the above symptoms after business hours or over the weekend, please call the main number at [phone] and ask for the [facility] Transplant Surgeon Resident on-call.     May shower after 48 hours from surgery.   Pat area dry, do not scrub.     Please follow up with Dr. Horace Porteous office today or tomorrow to make a follow-up appointment for 2 weeks from today: [phone]      General anesthesia & narcotic pain medicines can make you sleepy & forgetful.  They can effect your judgement & balance.  You should have a responsible adult stay with you for 24 hrs after your surgery.  Common side effects of anesthesia are  Nausea & vomiting, sore throat, muscle ache & weakness and fatigue.  You should rest today.  Do not drive, drink alcohol or operate dangerous equipment for 24 hrs & while taking narcotics.  Do not make important decisions or sign legal document today.

## 2019-02-08 NOTE — Unmapped (Signed)
Vascular Access for Dialysis Note:  Miguel Ward, DOB: 28-Feb-1951     02/08/2019:   Pt had Significantly dilated RUE AVF with successful banding of inflow to 5 cm or less on 02/07/2019 with Dr Norma Fredrickson.  1.) Request to schedule post op appt on or about 03/02/2019 to [facility]  2.) Post op letter to patient  3.) will consult with Dr Norma Fredrickson on timing of post op echocardiogram if indicated    01/16/19 Spoke w/pt via phone to discuss scheduling surgery. He agreed to OR date 02/07/2019. Request sent to schedule Right upper extremity AVF revision, banding, possible interposition graft placement, possible ligation.  No pre-care needed. Consent signed and scanned on 10/24/18.  Pre-op letter sent to patient.   Echo 10/19/18    Nephrologist: Alvester Chou, MD. Last saw on 12/28/18. From her note: Fluid overload and HFrEF. Patient was admitted at the beginning of November 2019 for acute on chronic CHF exacerbation, felt to be secondary to high output from his large AVF. He underwent underwent fistula banding, but unfortunately his output remained high. At this point I think he should just have the fistula ligated, and patient is amenable to that. Will refer back to Dr. Norma Fredrickson for consideration of that. Stable on current diuretic dose, will continue for now.     11/17/18 Saw Dr Lucianne Muss and Dr Norma Fredrickson fistula okay from their perspective as long as heart is okay; pt sees Dr Kirtland Bouchard True 12/28/18; if she has other opinion we will f/u. Ordered EKG during clinic visit due to reported irregular heart rate; a-fibrillation on ECG. HR 58. No previously reported atrial fibrillation. Pt saw Dr Danne Harbor, Cardiologist, at [facility] on 10/27/18. Will refer pt to Dr. Willa Rough for follow up.     D/c from clinic, asymptomatic, ambulating, self.     Called Dr Willa Rough' office [phone] and they were able to schedule him for 4:20 pm today, 11/17/18. Called patient and he says he can make that appointment. He had no further questions or concerns.

## 2019-02-12 ENCOUNTER — Ambulatory Visit
Admit: 2019-02-12 | Discharge: 2019-02-12 | Disposition: A | Discharge status: Home / Self Care | Payer: MEDICARE | Attending: Emergency Medicine

## 2019-02-12 NOTE — Unmapped (Signed)
Pt called on call nurse as he had surgery on his vascular access this week and today it started bleeding and he has not been able to get it to stop.  It is also swollen and feels tight. I told him with it being the weekend he will need to go to the ER.  He wants to go to the [facility] ER and I told him that was fine.

## 2019-02-12 NOTE — Unmapped (Signed)
Pt with procedure to decrease flow to AV shut in right arm. Pt states for past day or so there has been swelling and mild bleeding to incision site. Pt with scant amount of blood on gauze.

## 2019-02-12 NOTE — Unmapped (Signed)
[facility]  Emergency Department Provider Note      ED Clinical Impression     Final diagnoses:   Complication of arteriovenous dialysis fistula, initial encounter (Primary)     Initial Impression, ED Course, Assessment and Plan     Impression:   68 y.o. male with pmh of thyroid disease, renal transplant, HLD, renal cx (R) (s/p nephrectomy), NSTEMI, CHF, lupus nephritis, CAD, HTN; presents for a bleeding problem. Vitals are unremarkable. Workup will include consult to vascular surgery and reassessment.     10:18 PM   Paged vascular surgery    10:47 PM   Dr. Lemar Livings kindly spoke to me regarding possible complications at this stage after fistula revision. Cannot formally evaluate patient because we are at [facility] but agrees that without hematoma or signs of infection it is safe for close observation at home.     00:00 AM  Reassessment, no change in size of hematoma or swelling, no change overall and exam of fistula.  Remains hemostatic and no bleeding since arrival.    1:01 AM   Spoke with Dr. Norma Fredrickson over [facility], discussed patient's current presentation and exam, states of no clinical signs of expanding hematoma or concern for infection and remains hemostatic, would recommend gentle wrapping from wrist to shoulder and d/c with outpatient f/u.  Recommends follow-up in 1-2 days (Monday) at transplant clinic, if patient experiences any bleeding through this Ace bandage, recommends come to the emergency department at [facility] for reassessment.    Laboratory tests and radiology reports that were available during my shift were reviewed and considered in the medical decision making. See the chart and documentation for details. I discussed the findings of the medical work-up, my rationale behind the medical decision making process and my treatment plan with the patient. The patient understands and is comfortable with the plan of care. Patient reports understanding of diagnosis, discharge instructions, and return precautions specific to the symptoms and diagnosis. Patient's questions answered. Patient has received maximum benefit of ED visit today and is safe for discharge home.  ____________________________________________    Time seen: February 11, 2019 9:51 PM    I have reviewed the triage vital signs and the nursing notes.  I have discussed the case with the ED Attending.     History     Chief Complaint  Bleeding/Bruising    HPI   Miguel Ward is a 68 y.o. male with pmh of thyroid disease, renal transplant, HLD, renal cx (R) (s/p nephrectomy), NSTEMI, CHF, lupus nephritis, CAD, HTN; presents with a bleeding problem. The pt reports he had a procedure to decrease flow to AV shunt in R arm 5 days ago. 3 days ago, he first noted mild swelling and intermittent trickle bleeding from the incision site that spontaneously resolves after a few drops of blood. Bruising around the incision started the day afterward. He denies any increase of the bruising since it started 2 days ago. Concerned that the area was swelling today because it has become more difficult to put his sleeves on. He states he called his on call transplant coordinator, who told him to go to the ED. Denies fever, vomiting, increasing pain.     Past Medical History:   Diagnosis Date   ??? Anemia    ??? CHF (congestive heart failure) (CMS-HCC)    ??? Coronary artery disease    ??? Disease of thyroid gland    ??? GERD (gastroesophageal reflux disease)    ??? Gout    ???  Hyperlipidemia    ??? Hypertension    ??? Lupus nephritis (CMS-HCC)    ??? NSTEMI (non-ST elevated myocardial infarction) (CMS-HCC)    ??? Red blood cell antibody positive     anti-K   ??? Renal cancer, right (CMS-HCC)    ??? Renal transplant, status post        Past Surgical History:   Procedure Laterality Date   ??? AV FISTULA PLACEMENT Right    ??? HERNIA REPAIR     ??? PR BREATH HYDROGEN TEST N/A 06/24/2016    Procedure: BREATH HYDROGEN TEST;  Surgeon: Nurse-Based Giproc;  [facility]: [facility];  Service: Gastroenterology   ??? PR COLONOSCOPY FLX DX W/COLLJ SPEC WHEN PFRMD N/A 05/22/2016    Procedure: COLONOSCOPY, FLEXIBLE, PROXIMAL TO SPLENIC FLEXURE; DIAGNOSTIC, W/WO COLLECTION SPECIMEN BY BRUSH OR WASH;  Surgeon: Liane Comber, MD;  Location: [facility];  Service: Gastroenterology   ??? PR LIGATN ANGIOACCESS AV FISTULA Right 10/21/2018    Procedure: LIGATION OR BANDING OF ANGIOACCESS ARTERIOVENOUS FISTULA, UPPER EXTREMITY;  Surgeon: Leona Carry, MD;  Location: MAIN OR [facility];  Service: Transplant   ??? PR NEPHRECTOMY Right 02/20/2014    Procedure: LAPAROSCOPY, SURGICAL; NEPHRECTOMY, INCLUDING PARTIAL URETERECTOMY;  Surgeon: Vivi Barrack, MD;  Location: MAIN OR [facility];  Service: Transplant   ??? PR REVISE AV FISTULA,W/O THROMBECTOMY Right 02/07/2019    Procedure: REVISON, OPEN, ARTERIOVENOUS FISTULA; WITHOUT THROMBECTOMY, AUTOGENOUS OR NONAUTOGENOUS DIALYSIS GRAFT;  Surgeon: Leona Carry, MD;  Location: MAIN OR [facility];  Service: Transplant   ??? TRANSPLANTATION RENAL         No current facility-administered medications for this encounter.     Current Outpatient Medications:   ???  acyclovir (ZOVIRAX) 400 MG tablet, TAKE (1) TABLET BY MOUTH EVERY DAY, Disp: 90 tablet, Rfl: 3  ???  allopurinoL (ZYLOPRIM) 100 MG tablet, TAKE 1 AND 1/2 TABLETS BY MOUTH ONCE DAILY, Disp: 135 each, Rfl: 0  ???  aspirin (ASPIRIN LOW-STRENGTH) 81 MG chewable tablet, Chew 81 mg daily., Disp: , Rfl:   ???  atorvastatin (LIPITOR) 20 MG tablet, TAKE (1) TABLET BY MOUTH EVERY DAY, Disp: 90 each, Rfl: 0  ???  calcitrioL (ROCALTROL) 0.25 MCG capsule, TAKE 1 CAPSULE BY MOUTH EVERY MONDAY, WEDNESDAY AND FRIDAY, Disp: 36 each, Rfl: 0  ???  CELLCEPT 250 mg capsule, TAKE 1 CAPSULE BY MOUTH TWICE DAILY, Disp: 180 each, Rfl: 3  ???  colchicine (COLCRYS) 0.6 mg tablet, Take 1 tablet (0.6 mg total) by mouth daily as needed. for up to 1 dose (Patient not taking: Reported on 11/17/2018), Disp: 30 tablet, Rfl: 11  ???  docusate sodium (COLACE) 100 MG capsule, Take 1 capsule (100 mg total) by mouth Two (2) times a day for 5 days., Disp: 10 capsule, Rfl: 0  ???  ferrous sulfate 325 (65 FE) MG tablet, Take 1 tablet (325 mg total) by mouth daily., Disp: , Rfl: 0  ???  fluticasone (FLONASE) 50 mcg/actuation nasal spray, 1 spray into each nostril daily as needed., Disp: , Rfl:   ???  furosemide (LASIX) 40 MG tablet, Take 1 tablet (40 mg total) by mouth Two (2) times a day., Disp: 180 tablet, Rfl: 3  ???  hydrocortisone 2.5 % ointment, Only when needed in front of ear; can used more broadly on lower legs if needed for itching. (Patient not taking: Reported on 11/17/2018), Disp: 453 g, Rfl: 1  ???  levothyroxine (SYNTHROID) 200 MCG tablet, Take 1 tab with two tab, total  dose daily, Disp: 90 tablet, Rfl: 3  ???  levothyroxine (SYNTHROID, LEVOTHROID) 25 MCG tablet, Take 2 tabs with one tab, total dose daily, Disp: 90 tablet, Rfl: 3  ???  magnesium oxide (MAG-OX) 400 mg (241.3 mg magnesium) tablet, Take 1 tablet (400 mg total) by mouth Two (2) times a day., Disp: 180 tablet, Rfl: 3  ???  metoprolol succinate (TOPROL-XL) 100 MG 24 hr tablet, Take 1 tablet (100 mg total) by mouth daily., Disp: 90 tablet, Rfl: 3  ???  omeprazole (PRILOSEC) 20 MG capsule, Take 1 capsule (20 mg total) by mouth daily., Disp: 90 capsule, Rfl: 3  ???  oxyCODONE (ROXICODONE) 5 MG immediate release tablet, Take 1 tablet (5 mg total) by mouth every six (6) hours as needed for pain., Disp: 10 tablet, Rfl: 0  ???  predniSONE (DELTASONE) 5 MG tablet, TAKE (1) TABLET BY MOUTH EVERY DAY, Disp: 90 each, Rfl: 0  ???  PROGRAF 1 mg capsule, TAKE 3 CAPSULES (3MG ) BY MOUTH IN THE MORNING AND 2 CAPSULES (2MG ) BY MOUTH IN THE EVENING., Disp: 180 capsule, Rfl: 11  ???  simethicone (GAS-X) 80 MG chewable tablet, Chew 1 tablet (80 mg total) every six (6) hours as needed for flatulence., Disp: 30 tablet, Rfl: 1  ???  triamcinolone (KENALOG) 0.1 % ointment, Apply topically Two (2) times a day. For only 2 week BID topically from itchy area in front of ears., Disp: 80 g, Rfl: 1    Allergies  Penicillins    Family History   Problem Relation Age of Onset   ??? Heart disease Mother    ??? Diabetes Mother    ??? Hypertension Mother    ??? Cancer Father    ??? Alcohol abuse Brother    ??? Cardiomyopathy Neg Hx    ??? Sudden Cardiac Death Neg Hx    ??? Melanoma Neg Hx    ??? Basal cell carcinoma Neg Hx    ??? Squamous cell carcinoma Neg Hx    ??? Anesthesia problems Neg Hx      Social History  Social History     Tobacco Use   ??? Smoking status: Never Smoker   ??? Smokeless tobacco: Never Used   Substance Use Topics   ??? Alcohol use: No     Alcohol/week: 0.0 standard drinks   ??? Drug use: No     Review of Systems  General: No unintentional weight loss  GU: No discharge   Integumentary: No rash  All 10 systems have been reviewed and are negative except as otherwise documented.    Physical Exam     VITAL SIGNS:    ED Triage Vitals   Enc Vitals Group      BP 02/11/19 1910 186/101      Heart Rate 02/11/19 1910 79      SpO2 Pulse 02/11/19 2117 60      Resp 02/11/19 1910 18      Temp 02/11/19 1910 36.5 ??C (97.7 ??F)      Temp Source 02/11/19 1910 Oral      SpO2 02/11/19 1910 97 %      Weight 02/11/19 1910 70.8 kg (156 lb)      Height 02/11/19 1910 1.829 m (6')     Constitutional: Alert and oriented.  Nontoxic appearing and in no distress.  Eyes: Conjunctivae are normal.  ENT       Head: Normocephalic and atraumatic.       Nose: No congestion.  Mouth/Throat: Mucous membranes are moist.       Neck: No stridor.  Cardiovascular: Normal rate, regular rhythm. Extremities warm and well perfused. Palpable thrill proximal to incision site, nontender to palpation.  Respiratory: Normal respiratory effort, breathing comfortably on room air. Breath sounds are normal.  Gastrointestinal: Soft, nontender and nondistended.   Musculoskeletal: Nontender and non-edematous with equal range of motion in all extremities.  Neurologic: Normal speech and language. No gross focal neurologic deficits are appreciated.  Skin: Skin is warm, dry and intact. No rash noted. Ecchymosis as shown below.  Psychiatric: Mood and affect are congruent. Speech and behavior are normal.                     Radiology     No results found.  Laboratory Data     Lab Results   Component Value Date    WBC 5.0 12/28/2018    HGB 10.8 (L) 12/28/2018    HCT 36.4 (L) 12/28/2018    PLT 100 (L) 12/28/2018       Lab Results   Component Value Date    NA 140 12/28/2018    K 4.3 12/28/2018    CL 104 12/28/2018    CO2 26.0 12/28/2018    BUN 70 (H) 12/28/2018    CREATININE 2.67 (H) 12/28/2018    GLU 118 12/28/2018    CALCIUM 9.8 12/28/2018    MG 1.7 12/28/2018    PHOS 3.2 12/28/2018       Lab Results   Component Value Date    BILITOT 1.1 10/17/2018    BILIDIR 0.40 08/30/2016    PROT 7.0 10/17/2018    ALBUMIN 3.6 10/21/2018    ALT 16 10/17/2018    AST 25 10/17/2018    ALKPHOS 143 (H) 10/17/2018    GGT 30 01/26/2014       Lab Results   Component Value Date    LABPROT 12.7 (H) 05/23/2014    INR 1.45 10/17/2018    APTT 35.3 10/17/2018       Pertinent labs & imaging results that were available during my care of the patient were reviewed by me and considered in my medical decision making (see chart for details).    Documentation assistance was provided by Ezzie Dural, Scribe, on February 11, 2019 at 9:51 PM for Aurther Loft, MD.    Documentation assistance was provided by the scribe in my presence.  The documentation recorded by the scribe has been reviewed by me and accurately reflects the services I personally performed.       Sherlyn Lick, MD  Resident  02/12/19 0130

## 2019-02-13 ENCOUNTER — Ambulatory Visit: Admit: 2019-02-13 | Discharge: 2019-02-14 | Payer: MEDICARE

## 2019-02-13 DIAGNOSIS — I770 Arteriovenous fistula, acquired: Principal | ICD-10-CM

## 2019-02-13 DIAGNOSIS — Z9889 Other specified postprocedural states: Principal | ICD-10-CM

## 2019-02-13 DIAGNOSIS — E785 Hyperlipidemia, unspecified: Principal | ICD-10-CM

## 2019-02-13 DIAGNOSIS — Z940 Kidney transplant status: Principal | ICD-10-CM

## 2019-02-13 DIAGNOSIS — M3214 Glomerular disease in systemic lupus erythematosus: Principal | ICD-10-CM

## 2019-02-13 DIAGNOSIS — I2510 Atherosclerotic heart disease of native coronary artery without angina pectoris: Principal | ICD-10-CM

## 2019-02-13 DIAGNOSIS — K219 Gastro-esophageal reflux disease without esophagitis: Principal | ICD-10-CM

## 2019-02-13 DIAGNOSIS — R609 Edema, unspecified: Principal | ICD-10-CM

## 2019-02-13 DIAGNOSIS — D649 Anemia, unspecified: Principal | ICD-10-CM

## 2019-02-13 DIAGNOSIS — M109 Gout, unspecified: Principal | ICD-10-CM

## 2019-02-13 DIAGNOSIS — I509 Heart failure, unspecified: Principal | ICD-10-CM

## 2019-02-13 DIAGNOSIS — E079 Disorder of thyroid, unspecified: Principal | ICD-10-CM

## 2019-02-13 DIAGNOSIS — R768 Other specified abnormal immunological findings in serum: Principal | ICD-10-CM

## 2019-02-13 DIAGNOSIS — C641 Malignant neoplasm of right kidney, except renal pelvis: Principal | ICD-10-CM

## 2019-02-13 DIAGNOSIS — I10 Essential (primary) hypertension: Principal | ICD-10-CM

## 2019-02-13 DIAGNOSIS — I214 Non-ST elevation (NSTEMI) myocardial infarction: Principal | ICD-10-CM

## 2019-02-13 MED FILL — PROGRAF 1 MG CAPSULE: 30 days supply | Qty: 150 | Fill #3 | Status: AC

## 2019-02-13 MED FILL — PROGRAF 1 MG CAPSULE: 30 days supply | Qty: 150 | Fill #3

## 2019-02-13 NOTE — Unmapped (Signed)
Called patient to check in after ER visit this weekend.    Patient denies any new bleeding.    Spoke to [place] NP who can see patient today.    Called patient back and he reports he can come now and should be here by 10.    Denies any other questions or concerns

## 2019-02-13 NOTE — Unmapped (Signed)
Vascular Access for Dialysis Note:  Miguel Ward, DOB: Oct 10, 1951     02/13/2019  Pt contacted the on-call kidney TNC 02/11/19 w/c/o bleeding from surgical fistula banding site performed 02/07/19 by Dr Norma Fredrickson. Directed to go to ER and was seen, ER provider consulted with Dr Norma Fredrickson. ...discussed patient's current presentation and exam, states of no clinical signs of expanding hematoma or concern for infection and remains hemostatic, would recommend gentle wrapping from wrist to shoulder and d/c with outpatient f/u...    Pt's TNC called pt this am and pt is scheduled to see Adline Peals, DNP in clinic at 1000 today.    02/08/2019:   Pt had Significantly dilated RUE AVF with successful banding of inflow to 5 cm or less on 02/07/2019 with Dr Norma Fredrickson.  1.) Request to schedule post op appt on or about 03/02/2019 to [facility]  2.) Post op letter to patient  3.) will consult with Dr Norma Fredrickson on timing of post op echocardiogram if indicated    01/16/19 Spoke w/pt via phone to discuss scheduling surgery. He agreed to OR date 02/07/2019. Request sent to schedule Right upper extremity AVF revision, banding, possible interposition graft placement, possible ligation.  No pre-care needed. Consent signed and scanned on 10/24/18.  Pre-op letter sent to patient.   Echo 10/19/18    Nephrologist: Alvester Chou, MD. Last saw on 12/28/18. From her note: Fluid overload and HFrEF. Patient was admitted at the beginning of November 2019 for acute on chronic CHF exacerbation, felt to be secondary to high output from his large AVF. He underwent underwent fistula banding, but unfortunately his output remained high. At this point I think he should just have the fistula ligated, and patient is amenable to that. Will refer back to Dr. Norma Fredrickson for consideration of that. Stable on current diuretic dose, will continue for now.     11/17/18 Saw Dr Lucianne Muss and Dr Norma Fredrickson fistula okay from their perspective as long as heart is okay; pt sees Dr Kirtland Bouchard True 12/28/18; if she has other opinion we will f/u. Ordered EKG during clinic visit due to reported irregular heart rate; a-fibrillation on ECG. HR 58. No previously reported atrial fibrillation. Pt saw Dr Danne Harbor, Cardiologist, at [facility] on 10/27/18. Will refer pt to Dr. Willa Rough for follow up.     D/c from clinic, asymptomatic, ambulating, self.     Called Dr Willa Rough' office [phone] and they were able to schedule him for 4:20 pm today, 11/17/18. Called patient and he says he can make that appointment. He had no further questions or concerns.

## 2019-02-13 NOTE — Unmapped (Signed)
VASCULAR ACCESS CLINIC    Assessment/Plan  Miguel Ward presented to clinic for vascular access follow up s/p banding of distended high output LUE AVF.    Good hemostasis today. Encouraged Miguel Ward to continue wrapping extremity with ace bandage and elevating/icing to promote reduction of swelling. No concern for cellulitis. Photo in chart.    Encouraged him to leave the surgical adhesive in place to incision, no bleeding at current.    continue with plan for f/u with nephrologist if any continued high output heart failure symptoms persist, consider total ligation at that time.      Subjective  Miguel Ward??is a 68 y.o.??male??w/ PMHx of ESRD 2/2 lupus nephritis (s/p DDKT??on 06/11/2007, RCC (s/p R renal nephrectomy (02/20/2014)), CAD, HTN,??prior??pericardial effusion, hypothyroidism, and gout??who over the last year presented to [facility] with dyspnea and anasarca concerning for heart failure exacerbation. He was found to have a hight output RUE AVF which had a flow of 12 L/min across it. Patient was diuresed by nephrology and consulted [place] for the high output fistula. Patient underwent RUE AVF banding on 10/21/2018. His fistula remained significantly dilated and pulsatile, and he required subsequent banding 02/07/2019 of inflow to 5 cm or less. Since that time he presented to the ED with bleeding to incisional area; hemostasis was achieved and he presents for short term clinic follow up today.    He is asymptomatic at present. He denies any chest pain, SOB, fever, pain, weakness, tingling, numbness. His arm is appropriately tender, ecchymotic and swollen, but no longer bleeding/oozing. Afebrile.        Allergies  Penicillins    Medications    Current Outpatient Medications   Medication Sig Dispense Refill   ??? acyclovir (ZOVIRAX) 400 MG tablet TAKE (1) TABLET BY MOUTH EVERY DAY 90 tablet 3   ??? allopurinoL (ZYLOPRIM) 100 MG tablet TAKE 1 AND 1/2 TABLETS BY MOUTH ONCE DAILY 135 each 0   ??? aspirin (ASPIRIN LOW-STRENGTH) 81 MG chewable tablet Chew 81 mg daily.     ??? atorvastatin (LIPITOR) 20 MG tablet TAKE (1) TABLET BY MOUTH EVERY DAY 90 each 0   ??? calcitrioL (ROCALTROL) 0.25 MCG capsule TAKE 1 CAPSULE BY MOUTH EVERY MONDAY, WEDNESDAY AND FRIDAY 36 each 0   ??? CELLCEPT 250 mg capsule TAKE 1 CAPSULE BY MOUTH TWICE DAILY 180 each 3   ??? ferrous sulfate 325 (65 FE) MG tablet Take 1 tablet (325 mg total) by mouth daily.  0   ??? fluticasone (FLONASE) 50 mcg/actuation nasal spray 1 spray into each nostril daily as needed.     ??? levothyroxine (SYNTHROID) 200 MCG tablet Take 1 tab with two tab, total dose daily 90 tablet 3   ??? levothyroxine (SYNTHROID, LEVOTHROID) 25 MCG tablet Take 2 tabs with one tab, total dose daily 90 tablet 3   ??? magnesium oxide (MAG-OX) 400 mg (241.3 mg magnesium) tablet Take 1 tablet (400 mg total) by mouth Two (2) times a day. 180 tablet 3   ??? metoprolol succinate (TOPROL-XL) 100 MG 24 hr tablet Take 1 tablet (100 mg total) by mouth daily. 90 tablet 3   ??? omeprazole (PRILOSEC) 20 MG capsule Take 1 capsule (20 mg total) by mouth daily. 90 capsule 3   ??? oxyCODONE (ROXICODONE) 5 MG immediate release tablet Take 1 tablet (5 mg total) by mouth every six (6) hours as needed for pain. 10 tablet 0   ??? predniSONE (DELTASONE) 5 MG tablet TAKE (1) TABLET BY  MOUTH EVERY DAY 90 each 0   ??? PROGRAF 1 mg capsule TAKE 3 CAPSULES (3MG ) BY MOUTH IN THE MORNING AND 2 CAPSULES (2MG ) BY MOUTH IN THE EVENING. 180 capsule 11   ??? simethicone (GAS-X) 80 MG chewable tablet Chew 1 tablet (80 mg total) every six (6) hours as needed for flatulence. 30 tablet 1   ??? triamcinolone (KENALOG) 0.1 % ointment Apply topically Two (2) times a day. For only 2 week BID topically from itchy area in front of ears. 80 g 1   ??? colchicine (COLCRYS) 0.6 mg tablet Take 1 tablet (0.6 mg total) by mouth daily as needed. for up to 1 dose (Patient not taking: Reported on 11/17/2018) 30 tablet 11   ??? furosemide (LASIX) 40 MG tablet Take 1 tablet (40 mg total) by mouth Two (2) times a day. 180 tablet 3   ??? hydrocortisone 2.5 % ointment Only when needed in front of ear; can used more broadly on lower legs if needed for itching. (Patient not taking: Reported on 11/17/2018) 453 g 1     No current facility-administered medications for this visit.        Past Medical History  Past Medical History:   Diagnosis Date   ??? Anemia    ??? CHF (congestive heart failure) (CMS-HCC)    ??? Coronary artery disease    ??? Disease of thyroid gland    ??? GERD (gastroesophageal reflux disease)    ??? Gout    ??? Hyperlipidemia    ??? Hypertension    ??? Lupus nephritis (CMS-HCC)    ??? NSTEMI (non-ST elevated myocardial infarction) (CMS-HCC)    ??? Red blood cell antibody positive     anti-K   ??? Renal cancer, right (CMS-HCC)    ??? Renal transplant, status post        Past Surgical History  Past Surgical History:   Procedure Laterality Date   ??? AV FISTULA PLACEMENT Right    ??? HERNIA REPAIR     ??? PR BREATH HYDROGEN TEST N/A 06/24/2016    Procedure: BREATH HYDROGEN TEST;  Surgeon: Nurse-Based Giproc;  [facility]: [facility];  Service: Gastroenterology   ??? PR COLONOSCOPY FLX DX W/COLLJ SPEC WHEN PFRMD N/A 05/22/2016    Procedure: COLONOSCOPY, FLEXIBLE, PROXIMAL TO SPLENIC FLEXURE; DIAGNOSTIC, W/WO COLLECTION SPECIMEN BY BRUSH OR WASH;  Surgeon: Liane Comber, MD;  Location: [facility];  Service: Gastroenterology   ??? PR LIGATN ANGIOACCESS AV FISTULA Right 10/21/2018    Procedure: LIGATION OR BANDING OF ANGIOACCESS ARTERIOVENOUS FISTULA, UPPER EXTREMITY;  Surgeon: Leona Carry, MD;  Location: MAIN OR [facility];  Service: Transplant   ??? PR NEPHRECTOMY Right 02/20/2014    Procedure: LAPAROSCOPY, SURGICAL; NEPHRECTOMY, INCLUDING PARTIAL URETERECTOMY;  Surgeon: Vivi Barrack, MD;  Location: MAIN OR [facility];  Service: Transplant   ??? PR REVISE AV FISTULA,W/O THROMBECTOMY Right 02/07/2019    Procedure: REVISON, OPEN, ARTERIOVENOUS FISTULA; WITHOUT THROMBECTOMY, AUTOGENOUS OR NONAUTOGENOUS DIALYSIS GRAFT;  Surgeon: Leona Carry, MD;  Location: MA[facility] OR [facility];  Service: Transplant   ??? TRANSPLANTATION RENAL         Review of Systems  A 12 system review of systems was negative except as noted in HPI    Objective  Blood pressure 173/84, pulse 82, temperature 35.9 ??C (96.7 ??F), temperature source Tympanic, height 182.9 cm (6'), weight 73.9 kg (163 lb), SpO2 100 %. Body mass index is 22.11 kg/m??.    Lungs: clear to auscultation, percussion to the bases,  and unlabored breathing   Heart: euvolemic, regular rate and rhythm, normal S1 and S2, no murmur  Ext: no edema, well perfused  Neuro: grossly intact    Inspection of the AVF reveals wound healing is appropriate, without symptoms or signs of infection or post-surgical complications. Arm is swollen and ecchymotic but no concern for cellulitis (photo in chart).    The fistula is soft and compressible to touch. The skin overlying the fistula is intact; distal hand is warm. No pain with palpation. A pulse is detected with palpation of the fistula. A thrill is palpable over the course of the fistula and most prominent over the arteriovenous anastomosis.     Auscultation of the fistula reveals a low-pitched bruit that is continuous along the length of the fistula and most accentuated at the site of arterial anastomosis.      The fistula is distended when the patient's arm is at rest and below their heart. When the arm is elevated to a level above that of the heart, the fistula collapses, suggesting that fistula outflow is patent.     Test Results  Lab Results   Component Value Date    WBC 5.0 12/28/2018    HGB 10.8 (L) 12/28/2018    HCT 36.4 (L) 12/28/2018    PLT 100 (L) 12/28/2018     Lab Results   Component Value Date    NA 140 12/28/2018    K 4.3 12/28/2018    CL 104 12/28/2018    CO2 26.0 12/28/2018    BUN 70 (H) 12/28/2018    CREATININE 2.67 (H) 12/28/2018    CALCIUM 9.8 12/28/2018    MG 1.7 12/28/2018    PHOS 3.2 12/28/2018 Imaging/Vein Mapping:    None today    _______________________________________    Gertie Fey, DNP, APRN, FNP-C  [facility]  [address]  [place], [place]  [address]

## 2019-02-20 MED ORDER — LEVOTHYROXINE 25 MCG TABLET
0 refills | 0 days | Status: CP
Start: 2019-02-20 — End: 2019-03-06

## 2019-02-20 MED ORDER — OMEPRAZOLE 20 MG CAPSULE,DELAYED RELEASE
ORAL_CAPSULE | 3 refills | 0 days | Status: CP
Start: 2019-02-20 — End: ?

## 2019-02-20 MED ORDER — METOPROLOL SUCCINATE ER 100 MG TABLET,EXTENDED RELEASE 24 HR
0 refills | 0 days | Status: CP
Start: 2019-02-20 — End: 2019-07-18

## 2019-02-20 NOTE — Unmapped (Signed)
Pt request for RX Refill

## 2019-02-23 ENCOUNTER — Ambulatory Visit: Admit: 2019-02-23 | Discharge: 2019-02-24 | Payer: MEDICARE | Attending: Surgery | Primary: Surgery

## 2019-02-23 DIAGNOSIS — I2510 Atherosclerotic heart disease of native coronary artery without angina pectoris: Principal | ICD-10-CM

## 2019-02-23 DIAGNOSIS — I10 Essential (primary) hypertension: Principal | ICD-10-CM

## 2019-02-23 DIAGNOSIS — C641 Malignant neoplasm of right kidney, except renal pelvis: Principal | ICD-10-CM

## 2019-02-23 DIAGNOSIS — R768 Other specified abnormal immunological findings in serum: Principal | ICD-10-CM

## 2019-02-23 DIAGNOSIS — Z09 Encounter for follow-up examination after completed treatment for conditions other than malignant neoplasm: Principal | ICD-10-CM

## 2019-02-23 DIAGNOSIS — M109 Gout, unspecified: Principal | ICD-10-CM

## 2019-02-23 DIAGNOSIS — I509 Heart failure, unspecified: Principal | ICD-10-CM

## 2019-02-23 DIAGNOSIS — Z940 Kidney transplant status: Principal | ICD-10-CM

## 2019-02-23 DIAGNOSIS — K219 Gastro-esophageal reflux disease without esophagitis: Principal | ICD-10-CM

## 2019-02-23 DIAGNOSIS — I214 Non-ST elevation (NSTEMI) myocardial infarction: Principal | ICD-10-CM

## 2019-02-23 DIAGNOSIS — D649 Anemia, unspecified: Principal | ICD-10-CM

## 2019-02-23 DIAGNOSIS — E079 Disorder of thyroid, unspecified: Principal | ICD-10-CM

## 2019-02-23 DIAGNOSIS — M3214 Glomerular disease in systemic lupus erythematosus: Principal | ICD-10-CM

## 2019-02-23 DIAGNOSIS — E785 Hyperlipidemia, unspecified: Principal | ICD-10-CM

## 2019-02-23 NOTE — Unmapped (Signed)
Post op visit    VASCULAR ACCESS CLINIC    Assessment/Plan  Miguel Ward presented to clinic for vascular access follow up s/p banding of distended high output LUE AVF. Banding appears successful as AVF much softer.    Good hemostasis today, some old non expanding hematoma at incision near the AVf inflow.     Encouraged him to leave the surgical adhesive in place to incision, no bleeding at current.    continue with plan for f/u with nephrologist if any continued high output heart failure symptoms persist, or discomfort in arm next step would be total ligation and resection of AVF at that time (May 2020).      Subjective  Miguel Wardis a 68 y.o.??male??w/ PMHx of ESRD 2/2 lupus nephritis (s/p DDKT??on 06/11/2007, RCC (s/p R renal nephrectomy (02/20/2014)), CAD, HTN,??prior??pericardial effusion, hypothyroidism, and gout??who over the last year presented to [facility] with dyspnea and anasarca concerning for heart failure exacerbation. He was found to have a hight output RUE AVF which had a flow of 12 L/min across it. Patient was diuresed by nephrology and consulted [place] for the high output fistula. Patient underwent RUE AVF banding on 10/21/2018. His fistula remained significantly dilated and pulsatile, and he required subsequent banding 02/07/2019 of inflow to 5 cm or less. Since that time he presented to the ED with bleeding to incisional area; hemostasis was achieved and he presents for short term clinic follow up today.    He is asymptomatic at present. He denies any chest pain, SOB, fever, pain, weakness, tingling, numbness. His arm is appropriately tender, ecchymotic and swollen, but no longer bleeding/oozing. Afebrile.        Allergies  Penicillins    Medications    Current Outpatient Medications   Medication Sig Dispense Refill   ??? acyclovir (ZOVIRAX) 400 MG tablet TAKE (1) TABLET BY MOUTH EVERY DAY 90 tablet 3   ??? allopurinoL (ZYLOPRIM) 100 MG tablet TAKE 1 AND 1/2 TABLETS BY MOUTH ONCE DAILY 135 each 0   ??? aspirin (ASPIRIN LOW-STRENGTH) 81 MG chewable tablet Chew 81 mg daily.     ??? atorvastatin (LIPITOR) 20 MG tablet TAKE (1) TABLET BY MOUTH EVERY DAY 90 each 0   ??? calcitrioL (ROCALTROL) 0.25 MCG capsule TAKE 1 CAPSULE BY MOUTH EVERY MONDAY, WEDNESDAY AND FRIDAY 36 each 0   ??? CELLCEPT 250 mg capsule TAKE 1 CAPSULE BY MOUTH TWICE DAILY 180 each 3   ??? colchicine (COLCRYS) 0.6 mg tablet Take 1 tablet (0.6 mg total) by mouth daily as needed. for up to 1 dose (Patient not taking: Reported on 11/17/2018) 30 tablet 11   ??? ferrous sulfate 325 (65 FE) MG tablet Take 1 tablet (325 mg total) by mouth daily.  0   ??? fluticasone (FLONASE) 50 mcg/actuation nasal spray 1 spray into each nostril daily as needed.     ??? furosemide (LASIX) 40 MG tablet Take 1 tablet (40 mg total) by mouth Two (2) times a day. 180 tablet 3   ??? hydrocortisone 2.5 % ointment Only when needed in front of ear; can used more broadly on lower legs if needed for itching. (Patient not taking: Reported on 11/17/2018) 453 g 1   ??? levothyroxine (SYNTHROID) 200 MCG tablet Take 1 tab with two tab, total dose daily 90 tablet 3   ??? levothyroxine (SYNTHROID) 25 MCG tablet TAKE 2 TABLETS WITH 1 TAB FOR A TOTAL DAILY DOSE OF 90 each 0   ??? magnesium oxide (  MAG-OX) 400 mg (241.3 mg magnesium) tablet Take 1 tablet (400 mg total) by mouth Two (2) times a day. 180 tablet 3   ??? metoprolol succinate (TOPROL-XL) 100 MG 24 hr tablet TAKE (1) TABLET BY MOUTH EVERY DAY 90 each 0   ??? omeprazole (PRILOSEC) 20 MG capsule TAKE 1 CAPSULE BY MOUTH EVERY DAY 90 capsule 3   ??? oxyCODONE (ROXICODONE) 5 MG immediate release tablet Take 1 tablet (5 mg total) by mouth every six (6) hours as needed for pain. 10 tablet 0   ??? predniSONE (DELTASONE) 5 MG tablet TAKE (1) TABLET BY MOUTH EVERY DAY 90 each 0   ??? PROGRAF 1 mg capsule TAKE 3 CAPSULES (3MG ) BY MOUTH IN THE MORNING AND 2 CAPSULES (2MG ) BY MOUTH IN THE EVENING. 180 capsule 11   ??? simethicone (GAS-X) 80 MG chewable tablet Chew 1 tablet (80 mg total) every six (6) hours as needed for flatulence. 30 tablet 1   ??? triamcinolone (KENALOG) 0.1 % ointment Apply topically Two (2) times a day. For only 2 week BID topically from itchy area in front of ears. 80 g 1     No current facility-administered medications for this visit.        Past Medical History  Past Medical History:   Diagnosis Date   ??? Anemia    ??? CHF (congestive heart failure) (CMS-HCC)    ??? Coronary artery disease    ??? Disease of thyroid gland    ??? GERD (gastroesophageal reflux disease)    ??? Gout    ??? Hyperlipidemia    ??? Hypertension    ??? Lupus nephritis (CMS-HCC)    ??? NSTEMI (non-ST elevated myocardial infarction) (CMS-HCC)    ??? Red blood cell antibody positive     anti-K   ??? Renal cancer, right (CMS-HCC)    ??? Renal transplant, status post        Past Surgical History  Past Surgical History:   Procedure Laterality Date   ??? AV FISTULA PLACEMENT Right    ??? HERNIA REPAIR     ??? PR BREATH HYDROGEN TEST N/A 06/24/2016    Procedure: BREATH HYDROGEN TEST;  Surgeon: Nurse-Based Giproc;  [facility];  Service: Gastroenterology   ??? PR COLONOSCOPY FLX DX W/COLLJ SPEC WHEN PFRMD N/A 05/22/2016    Procedure: COLONOSCOPY, FLEXIBLE, PROXIMAL TO SPLENIC FLEXURE; DIAGNOSTIC, W/WO COLLECTION SPECIMEN BY BRUSH OR WASH;  Surgeon: Liane Comber, MD;  Location: [facility];  Service: Gastroenterology   ??? PR LIGATN ANGIOACCESS AV FISTULA Right 10/21/2018    Procedure: LIGATION OR BANDING OF ANGIOACCESS ARTERIOVENOUS FISTULA, UPPER EXTREMITY;  Surgeon: Leona Carry, MD;  Location: MAIN OR [facility];  Service: Transplant   ??? PR NEPHRECTOMY Right 02/20/2014    Procedure: LAPAROSCOPY, SURGICAL; NEPHRECTOMY, INCLUDING PARTIAL URETERECTOMY;  Surgeon: Vivi Barrack, MD;  Location: MAIN OR [facility];  Service: Transplant   ??? PR REVISE AV FISTULA,W/O THROMBECTOMY Right 02/07/2019    Procedure: REVISON, OPEN, ARTERIOVENOUS FISTULA; WITHOUT THROMBECTOMY, AUTOGENOUS OR NONAUTOGENOUS DIALYSIS GRAFT;  Surgeon: Leona Carry, MD;  Location: MA[facility];  Service: Transplant   ??? TRANSPLANTATION RENAL         Review of Systems  A 12 system review of systems was negative except as noted in HPI    Objective  Blood pressure 173/99, pulse 64, temperature 37 ??C (98.6 ??F), height 182.9 cm (6'), weight 74 kg (163 lb 1.6 oz), SpO2 100 %. Body mass index is 22.12 kg/m??.  Lungs: clear to auscultation, percussion to the bases, and unlabored breathing   Heart: euvolemic, regular rate and rhythm, normal S1 and S2, no murmur  Ext: no edema, well perfused  Neuro: grossly intact    Inspection of the AVF reveals wound healing is appropriate, without symptoms or signs of infection or post-surgical complications. Arm is swollen and ecchymotic but no concern for cellulitis (photo in chart).    The fistula is soft and compressible to touch. The skin overlying the fistula is intact; distal hand is warm. No pain with palpation. A pulse is detected with palpation of the fistula. A thrill is palpable over the course of the fistula and most prominent over the arteriovenous anastomosis.     Auscultation of the fistula reveals a low-pitched bruit that is continuous along the length of the fistula and most accentuated at the site of arterial anastomosis.      The fistula is distended when the patient's arm is at rest and below their heart. When the arm is elevated to a level above that of the heart, the fistula collapses, suggesting that fistula outflow is patent.     Test Results  Lab Results   Component Value Date    WBC 5.0 12/28/2018    HGB 10.8 (L) 12/28/2018    HCT 36.4 (L) 12/28/2018    PLT 100 (L) 12/28/2018     Lab Results   Component Value Date    NA 140 12/28/2018    K 4.3 12/28/2018    CL 104 12/28/2018    CO2 26.0 12/28/2018    BUN 70 (H) 12/28/2018    CREATININE 2.67 (H) 12/28/2018    CALCIUM 9.8 12/28/2018    MG 1.7 12/28/2018    PHOS 3.2 12/28/2018       Imaging/Vein Mapping:    None today    _______________________________________    Gertie Fey, DNP, APRN, FNP-C  [facility]  [address]  [place], [place]  [address]

## 2019-03-06 MED ORDER — LEVOTHYROXINE 25 MCG TABLET
3 refills | 0 days | Status: CP
Start: 2019-03-06 — End: 2019-04-27

## 2019-03-06 NOTE — Unmapped (Signed)
[facility] Clinical Assessment & Refill Coordination Note    Miguel Ward, DOB: 05-26-1951  Phone: [phone] (home)     All above HIPAA information was verified with patient.     Specialty Medication(s):   Transplant: Prograf 1mg      Current Outpatient Medications   Medication Sig Dispense Refill   ??? acyclovir (ZOVIRAX) 400 MG tablet TAKE (1) TABLET BY MOUTH EVERY DAY 90 tablet 3   ??? allopurinoL (ZYLOPRIM) 100 MG tablet TAKE 1 AND 1/2 TABLETS BY MOUTH ONCE DAILY 135 each 0   ??? aspirin (ASPIRIN LOW-STRENGTH) 81 MG chewable tablet Chew 81 mg daily.     ??? atorvastatin (LIPITOR) 20 MG tablet TAKE (1) TABLET BY MOUTH EVERY DAY 90 each 0   ??? calcitrioL (ROCALTROL) 0.25 MCG capsule TAKE 1 CAPSULE BY MOUTH EVERY MONDAY, WEDNESDAY AND FRIDAY 36 each 0   ??? CELLCEPT 250 mg capsule TAKE 1 CAPSULE BY MOUTH TWICE DAILY 180 each 3   ??? colchicine (COLCRYS) 0.6 mg tablet Take 1 tablet (0.6 mg total) by mouth daily as needed. for up to 1 dose (Patient not taking: Reported on 11/17/2018) 30 tablet 11   ??? ferrous sulfate 325 (65 FE) MG tablet Take 1 tablet (325 mg total) by mouth daily.  0   ??? fluticasone (FLONASE) 50 mcg/actuation nasal spray 1 spray into each nostril daily as needed.     ??? furosemide (LASIX) 40 MG tablet Take 1 tablet (40 mg total) by mouth Two (2) times a day. 180 tablet 3   ??? hydrocortisone 2.5 % ointment Only when needed in front of ear; can used more broadly on lower legs if needed for itching. (Patient not taking: Reported on 11/17/2018) 453 g 1   ??? levothyroxine (SYNTHROID) 200 MCG tablet Take 1 tab with two tab, total dose daily 90 tablet 3   ??? levothyroxine (SYNTHROID) 25 MCG tablet TAKE 2 TABLETS WITH 1 TAB FOR A TOTAL DAILY DOSE OF 180 each 3   ??? magnesium oxide (MAG-OX) 400 mg (241.3 mg magnesium) tablet Take 1 tablet (400 mg total) by mouth Two (2) times a day. 180 tablet 3   ??? metoprolol succinate (TOPROL-XL) 100 MG 24 hr tablet TAKE (1) TABLET BY MOUTH EVERY DAY 90 each 0   ??? omeprazole (PRILOSEC) 20 MG capsule TAKE 1 CAPSULE BY MOUTH EVERY DAY 90 capsule 3   ??? oxyCODONE (ROXICODONE) 5 MG immediate release tablet Take 1 tablet (5 mg total) by mouth every six (6) hours as needed for pain. 10 tablet 0   ??? predniSONE (DELTASONE) 5 MG tablet TAKE (1) TABLET BY MOUTH EVERY DAY 90 each 0   ??? PROGRAF 1 mg capsule TAKE 3 CAPSULES (3MG ) BY MOUTH IN THE MORNING AND 2 CAPSULES (2MG ) BY MOUTH IN THE EVENING. 180 capsule 11   ??? simethicone (GAS-X) 80 MG chewable tablet Chew 1 tablet (80 mg total) every six (6) hours as needed for flatulence. 30 tablet 1   ??? triamcinolone (KENALOG) 0.1 % ointment Apply topically Two (2) times a day. For only 2 week BID topically from itchy area in front of ears. 80 g 1     No current facility-administered medications for this visit.         Changes to medications: Mattias reports no changes reported at this time.    Allergies   Allergen Reactions   ??? Penicillins Hives     Other reaction(s): HIVES  Changes to allergies: No    SPECIALTY MEDICATION ADHERENCE     Prograf 1 mg: 7 days of medicine on hand     Medication Adherence    Patient reported X missed doses in the last month:  0  Specialty Medication:  Prograf  Adherence tools used:  patient uses a pill box to manage medications, calendar          Specialty medication(s) dose(s) confirmed: Regimen is correct and unchanged.     Are there any concerns with adherence? No    Adherence counseling provided? Not needed    CLINICAL MANAGEMENT AND INTERVENTION      Clinical Benefit Assessment:    Do you feel the medicine is effective or helping your condition? Yes    Clinical Benefit counseling provided? Not needed    Adverse Effects Assessment:    Are you experiencing any side effects? No    Are you experiencing difficulty administering your medicine? No    Quality of Life Assessment:    How many days over the past month did your transplant  keep you from your normal activities? For example, brushing your teeth or getting up in the morning. 0    Have you discussed this with your provider? Not needed    Therapy Appropriateness:    Is therapy appropriate? Yes, therapy is appropriate and should be continued    DISEASE/MEDICATION-SPECIFIC INFORMATION      N/A    PATIENT SPECIFIC NEEDS     ? Does the patient have any physical, cognitive, or cultural barriers? No    ? Is the patient high risk? No     ? Does the patient require a Care Management Plan? No     ? Does the patient require physician intervention or other additional services (i.e. nutrition, smoking cessation, social work)? No      SHIPPING     Specialty Medication(s) to be Shipped:   Transplant: Prograf 1mg     Other medication(s) to be shipped: n/a - patient receives Myfortic from manufacturer     Changes to insurance: No    Delivery Scheduled: Yes, Expected medication delivery date: 3/26.     Medication will be delivered via UPS to the confirmed home address in [facility].    The patient will receive a drug information handout for each medication shipped and additional FDA Medication Guides as required.  Verified that patient has previously received a Conservation officer, historic buildings.    Clydell Hakim   [facility] Specialty Pharmacist

## 2019-03-08 MED FILL — PROGRAF 1 MG CAPSULE: 30 days supply | Qty: 150 | Fill #4

## 2019-03-08 MED FILL — PROGRAF 1 MG CAPSULE: 30 days supply | Qty: 150 | Fill #4 | Status: AC

## 2019-04-03 NOTE — Unmapped (Signed)
[facility] Refill Coordination Note    Specialty Medication(s) to be Shipped:   Transplant: Prograf 1mg    **Confirmed with patient, getting Myfortic from Mfg**     Miguel Ward, DOB: September 01, 1951  Phone: [phone] (home)     All above HIPAA information was verified with patient.     Completed refill call assessment today to schedule patient's medication shipment from the [facility] [phone]).       Specialty medication(s) and dose(s) confirmed: Regimen is correct and unchanged.   Changes to medications: Pericles reports no changes reported at this time.  Changes to insurance: No  Questions for the pharmacist: No    Confirmed patient received Welcome Packet with first shipment. The patient will receive a drug information handout for each medication shipped and additional FDA Medication Guides as required.       DISEASE/MEDICATION-SPECIFIC INFORMATION        N/A    SPECIALTY MEDICATION ADHERENCE     Medication Adherence    Patient reported X missed doses in the last month:  0  Specialty Medication:  Prograf 1mg    Patient is on additional specialty medications:  Yes  Additional Specialty Medications:  Cellcept 250mg   Patient Reported Additional Medication X Missed Doses in the Last Month:  0  Patient is on more than two specialty medications:  No  Any gaps in refill history greater than 2 weeks in the last 3 months:  no  Adherence tools used:  patient uses a pill box to manage medications, calendar        Prograf 1 mg: 12 days of medicine on hand     SHIPPING     Shipping address confirmed in Epic.     Delivery Scheduled: Yes, Expected medication delivery date: 04/12/2019.     Medication will be delivered via UPS to the home address in [employer] [place].    Jonny Longino P Allena Katz   [facility] Specialty Technician

## 2019-04-11 MED FILL — PROGRAF 1 MG CAPSULE: 30 days supply | Qty: 150 | Fill #5 | Status: AC

## 2019-04-11 MED FILL — PROGRAF 1 MG CAPSULE: 30 days supply | Qty: 150 | Fill #5

## 2019-04-19 NOTE — Unmapped (Signed)
left message, called to change 5/13 appt w/Dr True to Doximity or MyChart video visit

## 2019-04-24 ENCOUNTER — Other Ambulatory Visit
Admission: RE | Admit: 2019-04-24 | Discharge: 2019-04-24 | Disposition: A | Discharge status: Home / Self Care | Payer: Medicare HMO | Attending: Nephrology | Admitting: Nephrology

## 2019-04-24 ENCOUNTER — Other Ambulatory Visit: Payer: Self-pay

## 2019-04-24 ENCOUNTER — Encounter: Admit: 2019-04-24 | Discharge: 2019-04-24 | Payer: MEDICARE | Attending: Nephrology | Primary: Nephrology

## 2019-04-24 DIAGNOSIS — Z79899 Other long term (current) drug therapy: Secondary | ICD-10-CM | POA: Diagnosis not present

## 2019-04-24 DIAGNOSIS — D631 Anemia in chronic kidney disease: Secondary | ICD-10-CM | POA: Insufficient documentation

## 2019-04-24 DIAGNOSIS — Z789 Other specified health status: Secondary | ICD-10-CM | POA: Diagnosis not present

## 2019-04-24 DIAGNOSIS — N390 Urinary tract infection, site not specified: Secondary | ICD-10-CM | POA: Insufficient documentation

## 2019-04-24 DIAGNOSIS — E1129 Type 2 diabetes mellitus with other diabetic kidney complication: Secondary | ICD-10-CM | POA: Insufficient documentation

## 2019-04-24 DIAGNOSIS — E559 Vitamin D deficiency, unspecified: Secondary | ICD-10-CM | POA: Diagnosis not present

## 2019-04-24 DIAGNOSIS — Z114 Encounter for screening for human immunodeficiency virus [HIV]: Secondary | ICD-10-CM | POA: Insufficient documentation

## 2019-04-24 DIAGNOSIS — Z940 Kidney transplant status: Secondary | ICD-10-CM | POA: Insufficient documentation

## 2019-04-24 DIAGNOSIS — D899 Disorder involving the immune mechanism, unspecified: Secondary | ICD-10-CM | POA: Insufficient documentation

## 2019-04-24 DIAGNOSIS — N189 Chronic kidney disease, unspecified: Secondary | ICD-10-CM | POA: Insufficient documentation

## 2019-04-24 DIAGNOSIS — Z09 Encounter for follow-up examination after completed treatment for conditions other than malignant neoplasm: Secondary | ICD-10-CM | POA: Diagnosis not present

## 2019-04-24 DIAGNOSIS — B259 Cytomegaloviral disease, unspecified: Secondary | ICD-10-CM | POA: Insufficient documentation

## 2019-04-24 DIAGNOSIS — Z9483 Pancreas transplant status: Secondary | ICD-10-CM | POA: Insufficient documentation

## 2019-04-24 LAB — BASIC METABOLIC PANEL
Anion gap: 8 (ref 5–15)
BUN: 85 mg/dL — ABNORMAL HIGH (ref 8–23)
CO2: 27 mmol/L (ref 22–32)
Calcium: 9.4 mg/dL (ref 8.9–10.3)
Chloride: 102 mmol/L (ref 98–111)
Creatinine, Ser: 2.92 mg/dL — ABNORMAL HIGH (ref 0.61–1.24)
GFR calc Af Amer: 25 mL/min — ABNORMAL LOW (ref >60)
GFR calc non Af Amer: 22 mL/min — ABNORMAL LOW (ref >60)
Glucose, Bld: 101 mg/dL — ABNORMAL HIGH (ref 70–99)
Potassium: 4 mmol/L (ref 3.5–5.1)
Sodium: 137 mmol/L (ref 135–145)

## 2019-04-24 LAB — CBC WITH DIFFERENTIAL/PLATELET
Abs Immature Granulocytes: 0.02 10*3/uL (ref 0.00–0.07)
Basophils Absolute: 0 10*3/uL (ref 0.0–0.1)
Basophils Relative: 1 %
Eosinophils Absolute: 0.1 10*3/uL (ref 0.0–0.5)
Eosinophils Relative: 3 %
HCT: 41.3 % (ref 39.0–52.0)
Hemoglobin: 12.5 g/dL — ABNORMAL LOW (ref 13.0–17.0)
Immature Granulocytes: 0 %
Lymphocytes Relative: 27 %
Lymphs Abs: 1.2 10*3/uL (ref 0.7–4.0)
MCH: 28.2 pg (ref 26.0–34.0)
MCHC: 30.3 g/dL (ref 30.0–36.0)
MCV: 93 fL (ref 80.0–100.0)
Monocytes Absolute: 0.6 10*3/uL (ref 0.1–1.0)
Monocytes Relative: 13 %
Neutro Abs: 2.5 10*3/uL (ref 1.7–7.7)
Neutrophils Relative %: 56 %
Platelets: 106 10*3/uL — ABNORMAL LOW (ref 150–400)
RBC: 4.44 MIL/uL (ref 4.22–5.81)
RDW: 13.7 % (ref 11.5–15.5)
WBC: 4.5 10*3/uL (ref 4.0–10.5)
nRBC: 0 % (ref 0.0–0.2)

## 2019-04-24 LAB — PHOSPHORUS: Phosphorus: 3.7 mg/dL (ref 2.5–4.6)

## 2019-04-24 LAB — MAGNESIUM: Magnesium: 2.1 mg/dL (ref 1.7–2.4)

## 2019-04-25 LAB — BASIC METABOLIC PANEL
BLOOD UREA NITROGEN: 85 mg/dL — ABNORMAL HIGH
CHLORIDE: 102 mmol/L
CO2: 27 mmol/L
CREATININE: 2.92 mg/dL — ABNORMAL HIGH
EGFR MDRD AF AMER: 25 mL/min/{1.73_m2} — ABNORMAL LOW
EGFR MDRD NON AF AMER: 22 mL/min/{1.73_m2} — ABNORMAL LOW
GLUCOSE RANDOM: 101 mg/dL — ABNORMAL HIGH
POTASSIUM: 4 mmol/L

## 2019-04-25 LAB — CREATININE: Lab: 2.92 — ABNORMAL HIGH

## 2019-04-25 LAB — CBC W/ DIFFERENTIAL
BASOPHILS ABSOLUTE COUNT: 0 10*9/L
BASOPHILS RELATIVE PERCENT: 1 %
EOSINOPHILS RELATIVE PERCENT: 3 %
HEMATOCRIT: 41.3 %
LYMPHOCYTES ABSOLUTE COUNT: 1.2 10*9/L
LYMPHOCYTES RELATIVE PERCENT: 27 %
MEAN CORPUSCULAR HEMOGLOBIN CONC: 30.3 g/dL
MEAN CORPUSCULAR HEMOGLOBIN: 28.2 pg
MEAN CORPUSCULAR VOLUME: 93 fL
MONOCYTES RELATIVE PERCENT: 13 %
NEUTROPHILS ABSOLUTE COUNT: 2.5 10*9/L
NEUTROPHILS RELATIVE PERCENT: 56 %
PLATELET COUNT: 106 10*9/L — ABNORMAL LOW
RED BLOOD CELL COUNT: 4.44 10*12/L
RED CELL DISTRIBUTION WIDTH: 13.7 %
WBC ADJUSTED: 4.5 10*9/L

## 2019-04-25 LAB — MAGNESIUM: Lab: 2.1

## 2019-04-25 LAB — PLATELET COUNT: Lab: 106 — ABNORMAL LOW

## 2019-04-25 LAB — PHOSPHORUS: Lab: 3.7

## 2019-04-26 ENCOUNTER — Telehealth: Admit: 2019-04-26 | Discharge: 2019-04-27 | Payer: MEDICARE | Attending: Nephrology | Primary: Nephrology

## 2019-04-26 DIAGNOSIS — N184 Chronic kidney disease, stage 4 (severe): Secondary | ICD-10-CM

## 2019-04-26 DIAGNOSIS — I5022 Chronic systolic (congestive) heart failure: Secondary | ICD-10-CM

## 2019-04-26 DIAGNOSIS — R801 Persistent proteinuria, unspecified: Secondary | ICD-10-CM

## 2019-04-26 DIAGNOSIS — I10 Essential (primary) hypertension: Secondary | ICD-10-CM

## 2019-04-26 DIAGNOSIS — D899 Disorder involving the immune mechanism, unspecified: Secondary | ICD-10-CM

## 2019-04-26 DIAGNOSIS — Z940 Kidney transplant status: Principal | ICD-10-CM

## 2019-04-26 NOTE — Unmapped (Signed)
Transplant Nephrology Video Visit      History of Present Illness    Patient is a 68 y.o. male who underwent deceased donor transplant on Jul 09, 2007 secondary to lupus nephritis. His post transplant course has been complicated by early cellular rejection treated with Thymoglobulin. At that time, he was enrolled in the JAK-3 inhibitor trial and was taken off study drug. Additionally, he was noted to have a large pericardial effusion of greater than 1 L in 08/2007. At that time his TSH was 258. This improved with pericardiocentesis and thyroid replacement and has not reaccumulated. In 2009, he had coronary artery disease which required stenting. Patient does not have evidence of donor specific antibodies. His creatinine was between 1.5 and 2.2, but has been more in the low to mid 2s since his right native nephrectomy on 02/20/14 for renal cell carcinoma.  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 No evidence of DSA antibodies as of 05/20/2018. Most recent baseline creatinine while hospitalized was 2.6-2.8, had been around 2.2-2.6 immediately prior to that.    He was admitted to [facility] from 9/15 through 08/31/2016 after presenting with fever and leukocytosis. His initial urine culture specimen was lost, but he was treated for presumptive UTI. He improved clinically on antibiotics. During that evaluation he had a CT of the abdomen on 08/29/2016 that showed New indeterminate liver lesions concerning for metastatic disease versus post transplant lymphoproliferative disorder versus abscesses. He then underwent MRI on 9/18 which showed Two liver lesions with minimal enhancement. Differential includes post transplant lymphoproliferative disorder, sequelae of prior infection or hematoma, less likely acute abscess. Metastases unlikely given enhancement characteristics. EBV was negative. As an outpatient he then underwent a PET scan on 09/14/2016 which showed Enlarging right hepatic lobe lesion with intense FDG uptake and central area of photopenia, concerning for necrotic metastasis, less likely abscess. Stable right hepatic dome lesion with intense FDG uptake, concerning for additional metastasis. After consult with radiology, they felt the best approach was ultrasound guided biopsy which he had on 10/01/2016. Pathology revealed Fragments of liver tissue with parenchymal extinction, mixed inflammatory infiltrates, and reactive-appearing stromal changes (see comment), - Increased iron staining within adjacent intact hepatic parenchyma (grade 3 of 4), - Sinusoidal congestion. The differential diagnosis includes sequelae from an abscess (e.g., inflammatory pseudotumor) versus nonspecific inflammatory changes adjacent to an unsampled mass. Correlation with the radiologic findings and clinical follow up are suggested.    The patient had a repeat MRI on 02/01/17 to visualize lesions seen on MRI in September. His repeat MRI revealed interval moderate decrease in the size of previously detected liver lesions, which could represent the sequelae of resolving right hepatic lobe infection with interval decrease in size of large right hepatic lobe lesion with internal T1 hyperintensity likely reflecting proteinacious material and mild internal and perilesional enhancement. Interval decrease in size and conspicuity of small hepatic dome lesion is also noted and this could also represent a resolving abscess. They recommended a re-scan in approximately 3 months (04/2017). I reviewed the report and images with Dr. Rush Barer who agreed that the lesion continues to look like resolving infection.     Patient underwent renal biopsy on 12/30/2017 to evaluate proteinuria which had increased over the last year from 0.75 to 1.4. 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 no proliferative glomerular lesions. These changes can indicate a minor recurrence of the lupus nephritis not carrying any direct therapeutic and prognostic significance at the present time.); Tubular pigment deposits, consistent with lipofuscin.    He was admitted 3/1 through 02/14/18 with RSV infection, AKI and new diastolic and systolic heart failure. TTE showed moderate EF 40-45%, reduced from 52% reported in January 2015. Patient's home lasix was increased to 40 mg qd from 20 mg qd with improvement in symptoms. Creatinine peaked at 2.73, 2.69 on discharge. It was suspected that patient's RSV infection worsened his heart failure and caused AKI is due to reduced perfusion. Patient's enalapril was held on discharge.     He was admitted 11/4 through 10/21/18 after presenting with dyspnea secondary to acute on chronic systolic heart failure. Echo showed globally, mildly decreased EF of 45% and grade III diastolic dysfunction. Patient was diuresed with lasix and discharged home on 80 mg bid. He was also found to have 12 L/min flow on PVL fistula study and underwent fistula banding to prevent worsening cardiac function from high output heart failure. Creatinine was elevated at time of admission to 3 but downtrended with diuresis to 2.3-2.8. Tac dose was decreased to 3 mg qAM and 2 mg qPM. No other changes to immunosuppression. Enalapril held due to kidney dysfunction.     At surgery visit on 11/17/18 to discuss fistula ligation (as flow post banding remained at 10 L/min), EKG was ordered and patient noted to be in a-fib. Was able to see cardiology that afternoon. No episodes of a-fib while monitored in the hospital and no prior history. Patient asymptomatic and hemodynamically stable. Rate controlled on metoprolol 100 mg daily, not started on anticoagulation at that time.      Interval history since last visit 12/28/2018    His visit today is conducted via video due to the ongoing COVID-19 pandemic.    Since last visit, patient underwent banding of right upper AV fistula on 02/07/19. Tolerated procedure well. He was seen in the emergency department on 02/11/19, presenting with increased swelling and intermittent bleeding from AV fistula incision site. Vascular surgery and Dr. Norma Fredrickson were consulted who both agreed patient was stable for discharge home, so long as there was no hematoma or signs of infection.     At follow up appointment with vascular access clinic on 02/13/19, patient was hemodynamically stable and instructed to continue wrapping extremity with ace bandage and elevating to promote reduction of swelling.     Patient has been doing well, without major complaints today. Right arm swelling has resolved since banding of AV fistula in February 2020. No recurrent episodes of bleeding from fistula site. Dyspnea has resolved, he is able to work outside and around the house without difficulty. Denies recent gout flare, has not required colchicine in over a year. Patient admits to not checking at home blood pressures regularly.     Patient typically takes his prograf at 11-11:30 in the morning and evenings.       Review of Systems    Otherwise on review of systems patient denies fever or chills, chest pain, SOB, PND, orthopnea or edema. No N/V/abdominal pain. No current diarrhea. No dysuria, hematuria or difficulty voiding. All other systems are reviewed and are negative.    Medications    Current Outpatient Medications   Medication Sig Dispense Refill   ??? levothyroxine (SYNTHROID) 25 MCG tablet Take 50 mcg by mouth daily.  Take with 200 mcg tablet for TOTAL daily dose of 250 mcg.     ??? acyclovir (ZOVIRAX) 400 MG tablet TAKE (1) TABLET BY MOUTH EVERY DAY 90 tablet 3   ??? allopurinoL (ZYLOPRIM) 100 MG tablet TAKE 1 AND 1/2 TABLETS BY MOUTH ONCE DAILY 135 each 0   ??? aspirin (ASPIRIN LOW-STRENGTH) 81 MG chewable tablet Chew 81 mg daily.     ??? atorvastatin (LIPITOR) 20 MG tablet TAKE (1) TABLET BY MOUTH EVERY DAY 90 each 0   ??? calcitrioL (ROCALTROL) 0.25 MCG capsule TAKE 1 CAPSULE BY MOUTH EVERY MONDAY, WEDNESDAY AND FRIDAY 36 each 0   ??? CELLCEPT 250 mg capsule TAKE 1 CAPSULE BY MOUTH TWICE DAILY 180 each 3   ??? colchicine (COLCRYS) 0.6 mg tablet Take 1 tablet (0.6 mg total) by mouth daily as needed. for up to 1 dose (Patient not taking: Reported on 11/17/2018) 30 tablet 11   ??? ferrous sulfate 325 (65 FE) MG tablet Take 1 tablet (325 mg total) by mouth daily.  0   ??? fluticasone (FLONASE) 50 mcg/actuation nasal spray 1 spray into each nostril daily as needed.     ??? furosemide (LASIX) 40 MG tablet Take 1 tablet (40 mg total) by mouth Two (2) times a day. 180 tablet 3   ??? levothyroxine (SYNTHROID) 200 MCG tablet Take 1 tab with two tab, total dose daily 90 tablet 3   ??? magnesium oxide (MAG-OX) 400 mg (241.3 mg magnesium) tablet Take 1 tablet (400 mg total) by mouth Two (2) times a day. 180 tablet 3   ??? metoprolol succinate (TOPROL-XL) 100 MG 24 hr tablet TAKE (1) TABLET BY MOUTH EVERY DAY 90 each 0   ??? omeprazole (PRILOSEC) 20 MG capsule TAKE 1 CAPSULE BY MOUTH EVERY DAY 90 capsule 3   ??? predniSONE (DELTASONE) 5 MG tablet TAKE (1) TABLET BY MOUTH EVERY DAY 90 each 0   ??? PROGRAF 1 mg capsule TAKE 3 CAPSULES (3MG ) BY MOUTH IN THE MORNING AND 2 CAPSULES (2MG ) BY MOUTH IN THE EVENING. 180 capsule 11   ??? simethicone (GAS-X) 80 MG chewable tablet Chew 1 tablet (80 mg total) every six (6) hours as needed for flatulence. 30 tablet 1   ??? triamcinolone (KENALOG) 0.1 % ointment Apply topically Two (2) times a day. For only 2 week BID topically from itchy area in front of ears. 80 g 1     No current facility-administered medications for this visit.        Physical Exam    Constitutional: Alert and oriented. Well appearing and in no distress.  Eyes: Conjunctivae are normal.  ENT       Head: Normocephalic and atraumatic.       Nose: No rhinorrhea.       Mouth/Throat: Mucous membranes appear moist.       Neck: No audible stridor.  Respiratory: Normal respiratory pattern and effort. No audible wheezing or other abnormal breath sounds. Speaking easily in full sentences. No cough noted during my evaluation of the patient.  Gastrointestinal: Deferred.  Musculoskeletal: Moving all extremities normally.  Neurologic: Normal speech and language. No gross focal neurologic deficits are appreciated.  Skin: Skin appears warm and dry. No rash visible.  Psychiatric: Mood and affect are normal. Speech and behavior are normal.    Laboratory Results    Recent Results (from the past 170 hour(s))   Basic Metabolic Panel    Collection Time: 04/24/19  9:48 AM   Result Value Ref  Range    Sodium 137 mmol/L    Potassium 4.0 mmol/L    Chloride 102 mmol/L    CO2 27.0 mmol/L    BUN 85 (H) mg/dL    Creatinine 1.61 (H) mg/dL    Glucose 096 (H) mg/dL    Calcium 9.4 mg/dL    EGFR MDRD Non Af Amer 22 (L) mL/min/1.52m2    EGFR MDRD Af Amer 25 (L) mL/min/1.59m2   Magnesium Level    Collection Time: 04/24/19  9:48 AM   Result Value Ref Range    Magnesium 2.1 mg/dL   Phosphorus Level    Collection Time: 04/24/19  9:48 AM   Result Value Ref Range    Phosphorus 3.7 mg/dL   CBC w/ Differential    Collection Time: 04/24/19  9:48 AM   Result Value Ref Range    Results Verified by Slide Scan      WBC 4.5 10*9/L    WBC      RBC 4.44 10*12/L    HGB 12.5 (L) g/dL    HCT 04.5 %    MCV 40.9 fL    MCH 28.2 pg    MCHC 30.3 g/dL    RDW 81.1 %    MPV      Platelet 106 (L) 10*9/L    nRBC      Neutrophils % 56.0 %    Lymphocytes % 27.0 %    Monocytes % 13.0 %    Eosinophils % 3.0 %    Basophils % 1.0 %    Absolute Neutrophils 2.5 10*9/L    Absolute Lymphocytes 1.2 10*9/L    Absolute Monocytes 0.6 10*9/L    Absolute Eosinophils 0.1 10*9/L    Absolute Basophils 0.0 10*9/L    Microcytosis      Macrocytosis      Anisocytosis Hyperchromasia      Hypochromasia         Assessment and Plan    1. Status post renal transplant. His most recent creatinine from 04/24/2019 was slightly above his baseline, at 2.92. Suspect patient is dehydrated and no longer requires as much diuretic, since swelling and dyspnea have resolved, and fistula has been ligated. Hopeful that with decreased lasix, creatinine will return to baseline. Prograf level of 5.7 on 12/23/2018 is a 12 hour trough and close to goal of 6-8. Will continue current immunosuppression. He remains on a reduced dose of CellCept 250mg  BID with history of diarrhea and malignancy.    2. Fluid overload and HFrEF. He underwent  fistula banding in February 2020 and fluid retention/dyspnea have resolved since that time. I suspect he no longer requires as much diuretic, as his symptoms have resolved with surgery. Will cut evening dose of lasix in half, taking 40 mg in the morning and 20 mg at night. Hopeful that creatinine will return to baseline with decreased dose. Patient instructed to weigh himself daily and if weight begins to increase, or he develops swelling or difficulty breathing, he is to let us know and at that point I would return original dose. I am willing to accept a higher creatinine to achieve fluid balance.    3. Right renal cell carcinoma status post nephrectomy. Imaging previously showed hepatic mass that was biopsied and consistent with inflammatory process. MRI 05/20/2018 remains reassuring, surgery did not recommend any additional diagnostic workup, will just repeat MRI in one year (05/2019).    4. Hypertension. Historically, blood pressure has been controlled on current regimen. Will continue for now and encouraged  patient to check blood pressures at home more frequently.     5. New onset a-fib. Rate controlled on current dose of metoprolol. He is not currently on anticoagulation given history of thrombocytopenia. Cardiology is managing.     6. Hypothyroidism. TSH improved but remains slightly elevated at 7.93 on 10/18/18, down from 16.4 on 09/21/18. T3 low at 0.7. Will continue Synthroid at 250 mcg daily. Check thyroid labs at next visit.    7. Hypomagnesemia. Magnesium normal on 04/24/19, at 2.1. Has had diarrhea with higher levels of supplementation in the past. Tolerating 400 mg BID. Continue to encourage ingestion of high magnesium foods (educational information given in the past).    8. Proteinuria.  UP/C peaked at 1.5 on 05/20/2018, down to 0.46 at last visit (12/28/2018), even off lisinopril. Renal biopsy on 12/30/17 showed immune complex mediated glomerulopathy that may be consistent with minor recurrence of lupus nephritis. Do not feel comfortable pushing Cellcept dose higher given diarrhea in the past.     9. Health maintenance. We discussed the importance of social distancing, wearing a mask outside the home, and hand washing/sanitizing. We discussed that this is particularly important for transplant patients who are at risk for complications from coronavirus. Patient has received 2 doses shingrix. He received flu shot in clinic 09/2018.    10. Will see patient back in 4 months, or sooner if needed.       I spent 20 minutes on the audio/video with the patient. I spent an additional 10 minutes on pre- and post-visit activities.     The patient was physically located in [place] or a state in which I am permitted to provide care. The patient and/or parent/gauardian understood that s/he may incur co-pays and cost sharing, and agreed to the telemedicine visit. The visit was completed via phone and/or video, which was appropriate and reasonable under the circumstances given the patient's presentation at the time.    The patient and/or parent/guardian has been advised of the potential risks and limitations of this mode of treatment (including, but not limited to, the absence of in-person examination) and has agreed to be treated using telemedicine. The patient's/patient's family's questions regarding telemedicine have been answered.     If the phone/video visit was completed in an ambulatory setting, the patient and/or parent/guardian has also been advised to contact their provider???s office for worsening conditions, and seek emergency medical treatment and/or call 911 if the patient deems either necessary.      Scribe's Attestation: Lisbeth Ply, MD obtained and performed the history, physical exam and medical decision making elements that were entered into the chart.  Signed by Jerl Santos, Scribe, on Apr 26, 2019 at 9:46 AM.    ----------------------------------------------------------------------------------------------------------------------  Apr 27, 2019 3:37 PM. Documentation assistance provided by the Scribe. I was present during the time the encounter was recorded. The information recorded by the Scribe was done at my direction and has been reviewed and validated by me.  ----------------------------------------------------------------------------------------------------------------------

## 2019-05-01 DIAGNOSIS — Z940 Kidney transplant status: Principal | ICD-10-CM

## 2019-05-01 DIAGNOSIS — Z79899 Other long term (current) drug therapy: Secondary | ICD-10-CM

## 2019-05-01 LAB — TACROLIMUS, TROUGH: Lab: 7.7

## 2019-05-03 NOTE — Unmapped (Signed)
[facility] Refill Coordination Note    Specialty Medication(s) to be Shipped:   Transplant: Prograf 1mg      Miguel Ward, DOB: 1951/10/23  Phone: [phone] (home)     All above HIPAA information was verified with patient.     Completed refill call assessment today to schedule patient's medication shipment from the [facility] [phone]).       Specialty medication(s) and dose(s) confirmed: Regimen is correct and unchanged.   Changes to medications: Christohper reports no changes reported at this time.  Changes to insurance: No  Questions for the pharmacist: No    Confirmed patient received Welcome Packet with first shipment. The patient will receive a drug information handout for each medication shipped and additional FDA Medication Guides as required.       DISEASE/MEDICATION-SPECIFIC INFORMATION        N/A    SPECIALTY MEDICATION ADHERENCE     Medication Adherence    Patient reported X missed doses in the last month:  0  Specialty Medication:  Prograf 1mg    Patient is on additional specialty medications:  Yes  Additional Specialty Medications:  Cellcept 250mg   Patient Reported Additional Medication X Missed Doses in the Last Month:  0  Patient is on more than two specialty medications:  No  Adherence tools used:  patient uses a pill box to manage medications, calendar        Prograf 1 mg: 12 days of medicine on hand     SHIPPING     Shipping address confirmed in Epic.     Delivery Scheduled: Yes, Expected medication delivery date: 05/15/2019.     Medication will be delivered via UPS to the home address in Epic [place].    Costella Schwarz P Allena Katz   [facility] Specialty Technician

## 2019-05-12 MED FILL — PROGRAF 1 MG CAPSULE: 30 days supply | Qty: 150 | Fill #6

## 2019-05-12 MED FILL — PROGRAF 1 MG CAPSULE: 30 days supply | Qty: 150 | Fill #6 | Status: AC

## 2019-05-16 MED ORDER — ATORVASTATIN 20 MG TABLET
0 refills | 0 days | Status: CP
Start: 2019-05-16 — End: ?

## 2019-06-05 NOTE — Unmapped (Signed)
[facility] Refill Coordination Note    Specialty Medication(s) to be Shipped:   Transplant: Prograf 1mg     Other medication(s) to be shipped: none     Miguel Ward, DOB: Jan 17, 1951  Phone: [phone] (home)       All above HIPAA information was verified with patient.     Completed refill call assessment today to schedule patient's medication shipment from the [facility] [phone]).       Specialty medication(s) and dose(s) confirmed: Regimen is correct and unchanged.   Changes to medications: [name] reports no changes at this time.  Changes to insurance: No  Questions for the pharmacist: No    Confirmed patient received Welcome Packet with first shipment. The patient will receive a drug information handout for each medication shipped and additional FDA Medication Guides as required.       DISEASE/MEDICATION-SPECIFIC INFORMATION        N/A    SPECIALTY MEDICATION ADHERENCE     Medication Adherence    Patient reported X missed doses in the last month:  0  Adherence tools used:  patient uses a pill box to manage medications, calendar            Prograf 1mg : 14 days of medication on hand      SHIPPING     Shipping address confirmed in Epic.     Delivery Scheduled: Yes, Expected medication delivery date: 06/14/19.     Medication will be delivered via UPS to the home address in Epic [place].    [name]   [facility] Specialty Technician

## 2019-06-12 MED ORDER — CELLCEPT 250 MG CAPSULE
Freq: Two times a day (BID) | ORAL | 3 refills | 0 days | Status: CP
Start: 2019-06-12 — End: 2020-06-11

## 2019-06-13 MED FILL — PROGRAF 1 MG CAPSULE: 30 days supply | Qty: 150 | Fill #7 | Status: AC

## 2019-06-13 MED FILL — PROGRAF 1 MG CAPSULE: 30 days supply | Qty: 150 | Fill #7

## 2019-06-27 MED ORDER — PREDNISONE 5 MG TABLET
Freq: Every day | ORAL | 3 refills | 90 days | Status: CP
Start: 2019-06-27 — End: ?

## 2019-07-05 NOTE — Unmapped (Signed)
[facility] Refill Coordination Note    Specialty Medication(s) to be Shipped:   Transplant: Prograf 1mg   **Confirmed with patient still getting Cellcept from [facility]**     Miguel Ward, DOB: 05-05-1951  Phone: [phone] (home)     All above HIPAA information was verified with patient.     Completed refill call assessment today to schedule patient's medication shipment from the [facility] [phone]).       Specialty medication(s) and dose(s) confirmed: Regimen is correct and unchanged.   Changes to medications: Miguel Ward reports no changes reported at this time.  Changes to insurance: No  Questions for the pharmacist: No    Confirmed patient received Welcome Packet with first shipment. The patient will receive a drug information handout for each medication shipped and additional FDA Medication Guides as required.       DISEASE/MEDICATION-SPECIFIC INFORMATION        N/A    SPECIALTY MEDICATION ADHERENCE     Medication Adherence    Patient reported X missed doses in the last month: 0  Specialty Medication: Prograf 1mg    Patient is on additional specialty medications: Yes  Additional Specialty Medications: Cellcept 250mg   Patient Reported Additional Medication X Missed Doses in the Last Month: 0  Patient is on more than two specialty medications: No  Adherence tools used: patient uses a pill box to manage medications, calendar        Prograf 1 mg: 12 days of medicine on hand     SHIPPING     Shipping address confirmed in Epic.     Delivery Scheduled: Yes, Expected medication delivery date: 07/18/2019.     Medication will be delivered via UPS to the home address in Epic [place].    Fonnie Crookshanks P Allena Katz   [facility] Specialty Technician

## 2019-07-06 MED ORDER — MAGNESIUM OXIDE 400 MG (241.3 MG MAGNESIUM) TABLET
0 refills | 0 days | Status: CP
Start: 2019-07-06 — End: ?

## 2019-07-11 NOTE — Unmapped (Signed)
Dialysis Access Coordinator Note:  FRANKO HILLIKER, DOB: 12-08-1951    Issue/Concern:  Significantly dilated RUE AVF with successful banding of inflow to 5 cm or less on 02/07/2019 with Dr Norma Fredrickson.    Current Dialysis Access:   -    Previous dialysis access history:   -    Vessel Mapping/Studies:   Duplex fistula 10/31/2018  Echocardiogram 10/19/2018  Echocardiogram ordered and request to schedule end of August sent.    NOTES:   07/11/2019  Significantly dilated RUE AVF with successful banding of inflow to 5 cm or less on 02/07/2019 with Dr Norma Fredrickson.  STUDY: request to schedule 61-month post-op echocardiogram sent to [facility]  Cardiac output comparison pre and post fistula ligation    PMH: Kidney txp 2008  Nephrologist: Alvester Chou, MD    Gardiner Barefoot, BSN, RN  Dialysis Access Coordinator   [facility]  [facility]  [address] [place], [place] [address]  Desk: [phone]  Fax: [phone]

## 2019-07-12 NOTE — Unmapped (Signed)
Patient called and says he was unsure when his MRI was due to apt being cancelled and then rescheduled.      Let him know we can reschedule.      Reports he is doing well, denies any needs

## 2019-07-17 MED FILL — PROGRAF 1 MG CAPSULE: 30 days supply | Qty: 150 | Fill #8

## 2019-07-17 MED FILL — PROGRAF 1 MG CAPSULE: 30 days supply | Qty: 150 | Fill #8 | Status: AC

## 2019-07-18 MED ORDER — METOPROLOL SUCCINATE ER 100 MG TABLET,EXTENDED RELEASE 24 HR
3 refills | 0 days | Status: CP
Start: 2019-07-18 — End: ?

## 2019-07-18 NOTE — Unmapped (Signed)
Pt request for RX Refill

## 2019-08-08 NOTE — Unmapped (Signed)
[facility] Clinical Assessment & Refill Coordination Note    Miguel Ward, DOB: 05/21/1951  Phone: [phone] (home)     All above HIPAA information was verified with patient.     Specialty Medication(s):   Transplant: Prograf 1mg    Patient gets Cellcept from [facility].      Current Outpatient Medications   Medication Sig Dispense Refill   ??? acyclovir (ZOVIRAX) 400 MG tablet TAKE (1) TABLET BY MOUTH EVERY DAY 90 tablet 3   ??? allopurinoL (ZYLOPRIM) 100 MG tablet TAKE 1 AND 1/2 TABLETS BY MOUTH ONCE DAILY 135 each 0   ??? aspirin (ASPIRIN LOW-STRENGTH) 81 MG chewable tablet Chew 81 mg daily.     ??? atorvastatin (LIPITOR) 20 MG tablet TAKE ONE (1) TABLET BY MOUTH ONCE DAILY 90 each 0   ??? calcitrioL (ROCALTROL) 0.25 MCG capsule TAKE 1 CAPSULE BY MOUTH EVERY MONDAY, WEDNESDAY AND FRIDAY 36 each 0   ??? CELLCEPT 250 mg capsule Take 1 capsule (250 mg total) by mouth Two (2) times a day. 180 each 3   ??? colchicine (COLCRYS) 0.6 mg tablet Take 1 tablet (0.6 mg total) by mouth daily as needed. for up to 1 dose (Patient not taking: Reported on 11/17/2018) 30 tablet 11   ??? ferrous sulfate 325 (65 FE) MG tablet Take 1 tablet (325 mg total) by mouth daily.  0   ??? fluticasone (FLONASE) 50 mcg/actuation nasal spray 1 spray into each nostril daily as needed.     ??? furosemide (LASIX) 40 MG tablet Take 1 tablet (40 mg total) by mouth Two (2) times a day. 180 tablet 3   ??? levothyroxine (SYNTHROID) 200 MCG tablet Take 1 tab with two tab, total dose daily 90 tablet 3   ??? levothyroxine (SYNTHROID) 25 MCG tablet Take 50 mcg by mouth daily. Take with 200 mcg tablet for TOTAL daily dose of 250 mcg.     ??? magnesium oxide (MAG-OX) 400 mg (241.3 mg magnesium) tablet TAKE ONE TABLET BY MOUTH TWICE DAILY. 180 each 0   ??? metoprolol succinate (TOPROL-XL) 100 MG 24 hr tablet TAKE (1) TABLET BY MOUTH EVERY DAY 90 each 3   ??? omeprazole (PRILOSEC) 20 MG capsule TAKE 1 CAPSULE BY MOUTH EVERY DAY 90 capsule 3   ??? predniSONE (DELTASONE) 5 MG tablet Take 1 tablet (5 mg total) by mouth daily. 90 each 3   ??? PROGRAF 1 mg capsule TAKE 3 CAPSULES (3MG ) BY MOUTH IN THE MORNING AND 2 CAPSULES (2MG ) BY MOUTH IN THE EVENING. 180 capsule 11   ??? simethicone (GAS-X) 80 MG chewable tablet Chew 1 tablet (80 mg total) every six (6) hours as needed for flatulence. 30 tablet 1     No current facility-administered medications for this visit.         Changes to medications: Miguel Ward reports no changes at this time.    Allergies   Allergen Reactions   ??? Penicillins Hives     Other reaction(s): HIVES       Changes to allergies: No    SPECIALTY MEDICATION ADHERENCE     Prograf 1 mg: 13 days of medicine on hand       Medication Adherence    Patient reported X missed doses in the last month: 0  Specialty Medication: Prograf 1mg   Patient is on additional specialty medications: No  Adherence tools used: patient uses a pill box to manage medications, calendar  Specialty medication(s) dose(s) confirmed: Regimen is correct and unchanged.     Are there any concerns with adherence? No    Adherence counseling provided? Not needed    CLINICAL MANAGEMENT AND INTERVENTION      Clinical Benefit Assessment:    Do you feel the medicine is effective or helping your condition? Yes    Clinical Benefit counseling provided? Not needed    Adverse Effects Assessment:    Are you experiencing any side effects? No    Are you experiencing difficulty administering your medicine? No    Quality of Life Assessment:    How many days over the past month did your kidney transplant  keep you from your normal activities? For example, brushing your teeth or getting up in the morning. 0    Have you discussed this with your provider? Not needed    Therapy Appropriateness:    Is therapy appropriate? Yes, therapy is appropriate and should be continued    DISEASE/MEDICATION-SPECIFIC INFORMATION      N/A    PATIENT SPECIFIC NEEDS     ? Does the patient have any physical, cognitive, or cultural barriers? No    ? Is the patient high risk? Yes, patient taking a REMS drug     ? Does the patient require a Care Management Plan? No     ? Does the patient require physician intervention or other additional services (i.e. nutrition, smoking cessation, social work)? No      SHIPPING     Specialty Medication(s) to be Shipped:   Transplant: Prograf 1mg     Other medication(s) to be shipped: none     Changes to insurance: No    Delivery Scheduled: Yes, Expected medication delivery date: 08/17/2019.     Medication will be delivered via UPS to the confirmed home address in [facility].    The patient will receive a drug information handout for each medication shipped and additional FDA Medication Guides as required.  Verified that patient has previously received a Conservation officer, historic buildings.    All of the patient's questions and concerns have been addressed.    [name]   [facility] Specialty Pharmacist

## 2019-08-16 MED FILL — PROGRAF 1 MG CAPSULE: 30 days supply | Qty: 150 | Fill #9

## 2019-08-16 MED FILL — PROGRAF 1 MG CAPSULE: 30 days supply | Qty: 150 | Fill #9 | Status: AC

## 2019-08-28 ENCOUNTER — Ambulatory Visit: Admit: 2019-08-28 | Discharge: 2019-08-29 | Payer: MEDICARE

## 2019-08-28 DIAGNOSIS — Z9889 Other specified postprocedural states: Secondary | ICD-10-CM

## 2019-08-28 DIAGNOSIS — I10 Essential (primary) hypertension: Secondary | ICD-10-CM

## 2019-08-28 DIAGNOSIS — I5032 Chronic diastolic (congestive) heart failure: Secondary | ICD-10-CM

## 2019-08-28 DIAGNOSIS — Z940 Kidney transplant status: Secondary | ICD-10-CM

## 2019-08-28 DIAGNOSIS — I5023 Acute on chronic systolic (congestive) heart failure: Secondary | ICD-10-CM

## 2019-08-28 DIAGNOSIS — I110 Hypertensive heart disease with heart failure: Secondary | ICD-10-CM

## 2019-09-06 NOTE — Unmapped (Signed)
[facility] Clinical Assessment & Refill Coordination Note    Miguel Ward, DOB: Feb 16, 1951  Phone: [phone] (home)     All above HIPAA information was verified with patient.     Specialty Medication(s):   Transplant: Prograf 1mg      Current Outpatient Medications   Medication Sig Dispense Refill   ??? acyclovir (ZOVIRAX) 400 MG tablet TAKE (1) TABLET BY MOUTH EVERY DAY 90 tablet 3   ??? allopurinoL (ZYLOPRIM) 100 MG tablet TAKE 1 AND 1/2 TABLETS BY MOUTH ONCE DAILY 135 each 0   ??? aspirin (ASPIRIN LOW-STRENGTH) 81 MG chewable tablet Chew 81 mg daily.     ??? atorvastatin (LIPITOR) 20 MG tablet TAKE ONE (1) TABLET BY MOUTH ONCE DAILY 90 each 0   ??? calcitrioL (ROCALTROL) 0.25 MCG capsule TAKE 1 CAPSULE BY MOUTH EVERY MONDAY, WEDNESDAY AND FRIDAY 36 each 0   ??? CELLCEPT 250 mg capsule Take 1 capsule (250 mg total) by mouth Two (2) times a day. 180 each 3   ??? colchicine (COLCRYS) 0.6 mg tablet Take 1 tablet (0.6 mg total) by mouth daily as needed. for up to 1 dose (Patient not taking: Reported on 11/17/2018) 30 tablet 11   ??? ferrous sulfate 325 (65 FE) MG tablet Take 1 tablet (325 mg total) by mouth daily.  0   ??? fluticasone (FLONASE) 50 mcg/actuation nasal spray 1 spray into each nostril daily as needed.     ??? furosemide (LASIX) 40 MG tablet Take 1 tablet (40 mg total) by mouth Two (2) times a day. 180 tablet 3   ??? levothyroxine (SYNTHROID) 200 MCG tablet Take 1 tab with two tab, total dose daily 90 tablet 3   ??? levothyroxine (SYNTHROID) 25 MCG tablet Take 50 mcg by mouth daily. Take with 200 mcg tablet for TOTAL daily dose of 250 mcg.     ??? magnesium oxide (MAG-OX) 400 mg (241.3 mg magnesium) tablet TAKE ONE TABLET BY MOUTH TWICE DAILY. 180 each 0   ??? metoprolol succinate (TOPROL-XL) 100 MG 24 hr tablet TAKE (1) TABLET BY MOUTH EVERY DAY 90 each 3   ??? omeprazole (PRILOSEC) 20 MG capsule TAKE 1 CAPSULE BY MOUTH EVERY DAY 90 capsule 3   ??? predniSONE (DELTASONE) 5 MG tablet Take 1 tablet (5 mg total) by mouth daily. 90 each 3   ??? PROGRAF 1 mg capsule TAKE 3 CAPSULES (3MG ) BY MOUTH IN THE MORNING AND 2 CAPSULES (2MG ) BY MOUTH IN THE EVENING. 180 capsule 11   ??? simethicone (GAS-X) 80 MG chewable tablet Chew 1 tablet (80 mg total) every six (6) hours as needed for flatulence. 30 tablet 1     No current facility-administered medications for this visit.         Changes to medications: Miguel Ward reports no changes at this time.    Allergies   Allergen Reactions   ??? Penicillins Hives     Other reaction(s): HIVES       Changes to allergies: No    SPECIALTY MEDICATION ADHERENCE     Prograf 1 mg: 13 days of medicine on hand     Medication Adherence    Patient reported X missed doses in the last month: 0  Specialty Medication: Prograf 1mg   Patient is on additional specialty medications: No  Adherence tools used: patient uses a pill box to manage medications, calendar          Specialty medication(s) dose(s) confirmed: Regimen is correct and unchanged.  Are there any concerns with adherence? No    Adherence counseling provided? Not needed    CLINICAL MANAGEMENT AND INTERVENTION      Clinical Benefit Assessment:    Do you feel the medicine is effective or helping your condition? Yes    Clinical Benefit counseling provided? Not needed    Adverse Effects Assessment:    Are you experiencing any side effects? No    Are you experiencing difficulty administering your medicine? No    Quality of Life Assessment:    How many days over the past month did your kidney transplant  keep you from your normal activities? For example, brushing your teeth or getting up in the morning. 0    Have you discussed this with your provider? Not needed    Therapy Appropriateness:    Is therapy appropriate? Yes, therapy is appropriate and should be continued    DISEASE/MEDICATION-SPECIFIC INFORMATION      N/A    PATIENT SPECIFIC NEEDS     ? Does the patient have any physical, cognitive, or cultural barriers? No    ? Is the patient high risk? Yes, patient taking a REMS drug     ? Does the patient require a Care Management Plan? No     ? Does the patient require physician intervention or other additional services (i.e. nutrition, smoking cessation, social work)? No      SHIPPING     Specialty Medication(s) to be Shipped:   Transplant: Prograf 1mg     Other medication(s) to be shipped: none     Changes to insurance: No    Delivery Scheduled: Yes, Expected medication delivery date: 09/15/2019.     Medication will be delivered via UPS to the confirmed home address in [facility].    The patient will receive a drug information handout for each medication shipped and additional FDA Medication Guides as required.  Verified that patient has previously received a Conservation officer, historic buildings.    All of the patient's questions and concerns have been addressed.    Miguel Ward   [facility] Specialty Pharmacist

## 2019-09-07 DIAGNOSIS — Z0000 Encounter for general adult medical examination without abnormal findings: Secondary | ICD-10-CM

## 2019-09-07 DIAGNOSIS — Z940 Kidney transplant status: Secondary | ICD-10-CM

## 2019-09-08 ENCOUNTER — Ambulatory Visit: Admit: 2019-09-08 | Discharge: 2019-09-08 | Payer: MEDICARE | Attending: Nephrology | Primary: Nephrology

## 2019-09-08 ENCOUNTER — Ambulatory Visit: Admit: 2019-09-08 | Discharge: 2019-09-08 | Payer: MEDICARE

## 2019-09-08 DIAGNOSIS — I2510 Atherosclerotic heart disease of native coronary artery without angina pectoris: Secondary | ICD-10-CM

## 2019-09-08 DIAGNOSIS — Z7982 Long term (current) use of aspirin: Secondary | ICD-10-CM

## 2019-09-08 DIAGNOSIS — D899 Disorder involving the immune mechanism, unspecified: Secondary | ICD-10-CM

## 2019-09-08 DIAGNOSIS — E039 Hypothyroidism, unspecified: Secondary | ICD-10-CM

## 2019-09-08 DIAGNOSIS — R809 Proteinuria, unspecified: Secondary | ICD-10-CM

## 2019-09-08 DIAGNOSIS — I110 Hypertensive heart disease with heart failure: Secondary | ICD-10-CM

## 2019-09-08 DIAGNOSIS — I4891 Unspecified atrial fibrillation: Secondary | ICD-10-CM

## 2019-09-08 DIAGNOSIS — Z79899 Other long term (current) drug therapy: Secondary | ICD-10-CM

## 2019-09-08 DIAGNOSIS — Z905 Acquired absence of kidney: Secondary | ICD-10-CM

## 2019-09-08 DIAGNOSIS — I5041 Acute combined systolic (congestive) and diastolic (congestive) heart failure: Secondary | ICD-10-CM

## 2019-09-08 DIAGNOSIS — Z23 Encounter for immunization: Secondary | ICD-10-CM

## 2019-09-08 DIAGNOSIS — Z899 Acquired absence of limb, unspecified: Secondary | ICD-10-CM

## 2019-09-08 DIAGNOSIS — Z0000 Encounter for general adult medical examination without abnormal findings: Secondary | ICD-10-CM

## 2019-09-08 DIAGNOSIS — C641 Malignant neoplasm of right kidney, except renal pelvis: Secondary | ICD-10-CM

## 2019-09-08 DIAGNOSIS — Z940 Kidney transplant status: Secondary | ICD-10-CM

## 2019-09-08 DIAGNOSIS — I5022 Chronic systolic (congestive) heart failure: Secondary | ICD-10-CM

## 2019-09-08 DIAGNOSIS — I10 Essential (primary) hypertension: Secondary | ICD-10-CM

## 2019-09-08 LAB — URINALYSIS
BACTERIA: NONE SEEN /HPF
BILIRUBIN UA: NEGATIVE
GLUCOSE UA: NEGATIVE
KETONES UA: NEGATIVE
PROTEIN UA: NEGATIVE
RBC UA: 2 /HPF (ref <=3)
SPECIFIC GRAVITY UA: 1.01 (ref 1.003–1.030)
SQUAMOUS EPITHELIAL: <1 /HPF (ref 0–5)
UROBILINOGEN UA: 0.2
WBC UA: <1 /HPF (ref <=2)

## 2019-09-08 LAB — MAGNESIUM: Magnesium:MCnc:Pt:Ser/Plas:Qn:: 1.9

## 2019-09-08 LAB — PROTEIN/CREAT RATIO, URINE: Protein/Creatinine:MRto:Pt:Urine:Qn:: 0.355

## 2019-09-08 LAB — CBC W/ AUTO DIFF
BASOPHILS ABSOLUTE COUNT: 0 10*9/L (ref 0.0–0.1)
BASOPHILS RELATIVE PERCENT: 0.3 %
EOSINOPHILS ABSOLUTE COUNT: 0.1 10*9/L (ref 0.0–0.4)
EOSINOPHILS RELATIVE PERCENT: 2.3 %
HEMOGLOBIN: 12.7 g/dL — ABNORMAL LOW (ref 13.5–17.5)
LARGE UNSTAINED CELLS: 2 % (ref 0–4)
LYMPHOCYTES ABSOLUTE COUNT: 1.1 10*9/L — ABNORMAL LOW (ref 1.5–5.0)
LYMPHOCYTES RELATIVE PERCENT: 20.8 %
MEAN CORPUSCULAR HEMOGLOBIN CONC: 29.9 g/dL — ABNORMAL LOW (ref 31.0–37.0)
MEAN CORPUSCULAR HEMOGLOBIN: 28.4 pg (ref 26.0–34.0)
MEAN CORPUSCULAR VOLUME: 95 fL (ref 80.0–100.0)
MEAN PLATELET VOLUME: 10.6 fL — ABNORMAL HIGH (ref 7.0–10.0)
MONOCYTES ABSOLUTE COUNT: 0.4 10*9/L (ref 0.2–0.8)
MONOCYTES RELATIVE PERCENT: 7.6 %
NEUTROPHILS ABSOLUTE COUNT: 3.6 10*9/L (ref 2.0–7.5)
NEUTROPHILS RELATIVE PERCENT: 66.6 %
PLATELET COUNT: 98 10*9/L — ABNORMAL LOW (ref 150–440)
RED BLOOD CELL COUNT: 4.47 10*12/L — ABNORMAL LOW (ref 4.50–5.90)
RED CELL DISTRIBUTION WIDTH: 15.1 % — ABNORMAL HIGH (ref 12.0–15.0)

## 2019-09-08 LAB — AST (SGOT): Aspartate aminotransferase:CCnc:Pt:Ser/Plas:Qn:: 29

## 2019-09-08 LAB — BASIC METABOLIC PANEL
ANION GAP: 14 mmol/L (ref 7–15)
BLOOD UREA NITROGEN: 63 mg/dL — ABNORMAL HIGH (ref 7–21)
BUN / CREAT RATIO: 25
CALCIUM: 10.1 mg/dL (ref 8.5–10.2)
CHLORIDE: 101 mmol/L (ref 98–107)
CO2: 25 mmol/L (ref 22.0–30.0)
CREATININE: 2.55 mg/dL — ABNORMAL HIGH (ref 0.70–1.30)
EGFR CKD-EPI AA MALE: 29 mL/min/{1.73_m2} — ABNORMAL LOW (ref >=60)
EGFR CKD-EPI NON-AA MALE: 25 mL/min/{1.73_m2} — ABNORMAL LOW (ref >=60)
GLUCOSE RANDOM: 93 mg/dL (ref 70–99)
POTASSIUM: 4.8 mmol/L (ref 3.5–5.0)
SODIUM: 140 mmol/L (ref 135–145)

## 2019-09-08 LAB — HDL CHOLESTEROL: Cholesterol.in HDL:MCnc:Pt:Ser/Plas:Qn:: 66 — ABNORMAL HIGH

## 2019-09-08 LAB — PHOSPHORUS: Phosphate:MCnc:Pt:Ser/Plas:Qn:: 3.1

## 2019-09-08 LAB — LIPID PANEL
CHOLESTEROL/HDL RATIO SCREEN: 1.7 (ref <5.0)
CHOLESTEROL: 109 mg/dL (ref 100–199)
HDL CHOLESTEROL: 66 mg/dL — ABNORMAL HIGH (ref 40–59)
LDL CHOLESTEROL CALCULATED: 34 mg/dL — ABNORMAL LOW (ref 60–99)
NON-HDL CHOLESTEROL: 43 mg/dL

## 2019-09-08 LAB — HEPATIC FUNCTION PANEL
ALBUMIN: 3.9 g/dL (ref 3.5–5.0)
ALKALINE PHOSPHATASE: 125 U/L (ref 38–126)
AST (SGOT): 29 U/L (ref 19–55)
BILIRUBIN DIRECT: 0.2 mg/dL (ref 0.00–0.40)
BILIRUBIN TOTAL: 0.9 mg/dL (ref 0.0–1.2)

## 2019-09-08 LAB — PROTEIN / CREATININE RATIO, URINE
CREATININE, URINE: 60.8 mg/dL
PROTEIN URINE: 21.6 mg/dL

## 2019-09-08 LAB — TACROLIMUS, TROUGH: Lab: 7.6

## 2019-09-08 LAB — ANION GAP: Anion gap 3:SCnc:Pt:Ser/Plas:Qn:: 14

## 2019-09-08 LAB — CALCIUM: Calcium:MCnc:Pt:Ser/Plas:Qn:: 10.1

## 2019-09-08 LAB — MONOCYTES ABSOLUTE COUNT: Monocytes:NCnc:Pt:Bld:Qn:Automated count: 0.4

## 2019-09-08 LAB — BACTERIA: Bacteria:PrThr:Pt:Urine:Ord:: NONE SEEN

## 2019-09-08 LAB — HEMOGLOBIN A1C: Hemoglobin A1c/Hemoglobin.total:MFr:Pt:Bld:Qn:: 6 — ABNORMAL HIGH

## 2019-09-08 LAB — SMEAR REVIEW: Lab: 0

## 2019-09-08 MED ORDER — BLOOD PRESSURE TEST KIT-LARGE CUFF
Freq: Two times a day (BID) | 0 refills | 0 days | Status: CP | PRN
Start: 2019-09-08 — End: ?

## 2019-09-08 NOTE — Unmapped (Signed)
Saw patient in clinic today, reports he doing well.  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    Denies HA, cp, palpitations, dizziness, SOB, abdominal pain, n/v/d, urinary problems, fevers, tremors, or swelling    BP average 140/80's after medication and 170's/80's.  Would like a new BP cuff    Labs today, took prograf at 11pm    Reports no gout

## 2019-09-08 NOTE — Unmapped (Signed)
Transplant Nephrology Video Visit      History of Present Illness    Patient is a 68 y.o. male who underwent deceased donor transplant on Jul 09, 2007 secondary to lupus nephritis. His post transplant course has been complicated by early cellular rejection treated with Thymoglobulin. At that time, he was enrolled in the JAK-3 inhibitor trial and was taken off study drug. Additionally, he was noted to have a large pericardial effusion of greater than 1 L in 08/2007. At that time his TSH was 258. This improved with pericardiocentesis and thyroid replacement and has not reaccumulated. In 2009, he had coronary artery disease which required stenting. Patient does not have evidence of donor specific antibodies. His creatinine was between 1.5 and 2.2, but has been more in the low to mid 2s since his right native nephrectomy on 02/20/14 for renal cell carcinoma.  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 No evidence of DSA antibodies as of 05/20/2018. Most recent baseline creatinine while hospitalized was 2.6-2.8, had been around 2.2-2.6 immediately prior to that.    He was admitted to [facility] from 9/15 through 08/31/2016 after presenting with fever and leukocytosis. His initial urine culture specimen was lost, but he was treated for presumptive UTI. He improved clinically on antibiotics. During that evaluation he had a CT of the abdomen on 08/29/2016 that showed New indeterminate liver lesions concerning for metastatic disease versus post transplant lymphoproliferative disorder versus abscesses. He then underwent MRI on 9/18 which showed Two liver lesions with minimal enhancement. Differential includes post transplant lymphoproliferative disorder, sequelae of prior infection or hematoma, less likely acute abscess. Metastases unlikely given enhancement characteristics. EBV was negative. As an outpatient he then underwent a PET scan on 09/14/2016 which showed Enlarging right hepatic lobe lesion with intense FDG uptake and central area of photopenia, concerning for necrotic metastasis, less likely abscess. Stable right hepatic dome lesion with intense FDG uptake, concerning for additional metastasis. After consult with radiology, they felt the best approach was ultrasound guided biopsy which he had on 10/01/2016. Pathology revealed Fragments of liver tissue with parenchymal extinction, mixed inflammatory infiltrates, and reactive-appearing stromal changes (see comment), - Increased iron staining within adjacent intact hepatic parenchyma (grade 3 of 4), - Sinusoidal congestion. The differential diagnosis includes sequelae from an abscess (e.g., inflammatory pseudotumor) versus nonspecific inflammatory changes adjacent to an unsampled mass. Correlation with the radiologic findings and clinical follow up are suggested.    The patient had a repeat MRI on 02/01/17 to visualize lesions seen on MRI in September. His repeat MRI revealed interval moderate decrease in the size of previously detected liver lesions, which could represent the sequelae of resolving right hepatic lobe infection with interval decrease in size of large right hepatic lobe lesion with internal T1 hyperintensity likely reflecting proteinacious material and mild internal and perilesional enhancement. Interval decrease in size and conspicuity of small hepatic dome lesion is also noted and this could also represent a resolving abscess. They recommended a re-scan in approximately 3 months (04/2017). I reviewed the report and images with Dr. Rush Barer who agreed that the lesion continues to look like resolving infection.     Patient underwent renal biopsy on 12/30/2017 to evaluate proteinuria which had increased over the last year from 0.75 to 1.4. 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 no proliferative glomerular lesions. These changes can indicate a minor recurrence of the lupus nephritis not carrying any direct therapeutic and prognostic significance at the present time.); Tubular pigment deposits, consistent with lipofuscin.    He was admitted 3/1 through 02/14/18 with RSV infection, AKI and new diastolic and systolic heart failure. TTE showed moderate EF 40-45%, reduced from 52% reported in January 2015. Patient's home lasix was increased to 40 mg qd from 20 mg qd with improvement in symptoms. Creatinine peaked at 2.73, 2.69 on discharge. It was suspected that patient's RSV infection worsened his heart failure and caused AKI is due to reduced perfusion. Patient's enalapril was held on discharge.     He was admitted 11/4 through 10/21/18 after presenting with dyspnea secondary to acute on chronic systolic heart failure. Echo showed globally, mildly decreased EF of 45% and grade III diastolic dysfunction. Patient was diuresed with lasix and discharged home on 80 mg bid. He was also found to have 12 L/min flow on PVL fistula study and underwent fistula banding to prevent worsening cardiac function from high output heart failure. Creatinine was elevated at time of admission to 3 but downtrended with diuresis to 2.3-2.8. Tac dose was decreased to 3 mg qAM and 2 mg qPM. No other changes to immunosuppression. Enalapril held due to kidney dysfunction.     At surgery visit on 11/17/18 to discuss fistula ligation (as flow post banding remained at 10 L/min), EKG was ordered and patient noted to be in a-fib. Was able to see cardiology that afternoon. No episodes of a-fib while monitored in the hospital and no prior history. Patient asymptomatic and hemodynamically stable. Rate controlled on metoprolol 100 mg daily, not started on anticoagulation at that time.      He underwent banding of right upper AV fistula on 02/07/19. Tolerated procedure well. He was seen in the emergency department on 02/11/19, presenting with increased swelling and intermittent bleeding from AV fistula incision site. Vascular surgery and Dr. Norma Fredrickson were consulted who both agreed patient was stable for discharge home, so long as there was no hematoma or signs of infection.     Interval history since last visit 04/26/2019    He has been doing well since his last visit. No signs or symptoms of fluid overload, denies SOB/PND/orthopnea/edema.     His blood pressure runs 140s/80s after meds.    His church continues to do online services and occasional socially distanced outdoor services.    He specifically denies fever, myalgias, upper respiratory/GI symptoms, or change in taste/smell.       Review of Systems    Otherwise on review of systems patient denies fever or chills, chest pain, SOB, PND, orthopnea or edema. No N/V/abdominal pain. No current diarrhea. No dysuria, hematuria or difficulty voiding. All other systems are reviewed and are negative.    Medications    Current Outpatient Medications   Medication Sig Dispense Refill   ??? furosemide (LASIX) 20 MG tablet Take by mouth Two (2) times a day. Take 40 mg in the morning and 20 mg in the evening.     ??? acyclovir (ZOVIRAX) 400 MG tablet TAKE (1) TABLET BY MOUTH EVERY DAY 90 tablet 3   ??? allopurinoL (ZYLOPRIM) 100 MG tablet TAKE 1 AND 1/2 TABLETS BY MOUTH ONCE DAILY 135 each 0   ??? aspirin (ASPIRIN LOW-STRENGTH) 81 MG chewable tablet Chew 81 mg daily.     ??? atorvastatin (LIPITOR) 20  MG tablet TAKE ONE (1) TABLET BY MOUTH ONCE DAILY 90 each 0   ??? blood pressure test kit-large Kit 1 kit by Miscellaneous route two (2) times a day as needed. 1 each 0   ??? calcitrioL (ROCALTROL) 0.25 MCG capsule TAKE 1 CAPSULE BY MOUTH EVERY MONDAY, WEDNESDAY AND FRIDAY 36 each 0   ??? CELLCEPT 250 mg capsule Take 1 capsule (250 mg total) by mouth Two (2) times a day. 180 each 3   ??? colchicine (COLCRYS) 0.6 mg tablet Take 1 tablet (0.6 mg total) by mouth daily as needed. for up to 1 dose (Patient not taking: Reported on 11/17/2018) 30 tablet 11   ??? ferrous sulfate 325 (65 FE) MG tablet Take 1 tablet (325 mg total) by mouth daily.  0   ??? fluticasone (FLONASE) 50 mcg/actuation nasal spray 1 spray into each nostril daily as needed.     ??? levothyroxine (SYNTHROID) 200 MCG tablet Take 1 tab with two tab, total dose daily 90 tablet 3   ??? levothyroxine (SYNTHROID) 25 MCG tablet Take 50 mcg by mouth daily. Take with 200 mcg tablet for TOTAL daily dose of 250 mcg.     ??? magnesium oxide (MAG-OX) 400 mg (241.3 mg magnesium) tablet TAKE ONE TABLET BY MOUTH TWICE DAILY. 180 each 0   ??? metoprolol succinate (TOPROL-XL) 100 MG 24 hr tablet TAKE (1) TABLET BY MOUTH EVERY DAY 90 each 3   ??? omeprazole (PRILOSEC) 20 MG capsule TAKE 1 CAPSULE BY MOUTH EVERY DAY 90 capsule 3   ??? predniSONE (DELTASONE) 5 MG tablet Take 1 tablet (5 mg total) by mouth daily. 90 each 3   ??? PROGRAF 1 mg capsule TAKE 3 CAPSULES (3MG ) BY MOUTH IN THE MORNING AND 2 CAPSULES (2MG ) BY MOUTH IN THE EVENING. 180 capsule 11   ??? simethicone (GAS-X) 80 MG chewable tablet Chew 1 tablet (80 mg total) every six (6) hours as needed for flatulence. 30 tablet 1     No current facility-administered medications for this visit.        Physical Exam    BP 152/84 (BP Site: L Arm, BP Position: Sitting, BP Cuff Size: Medium)  - Pulse 78  - Temp 36.1 ??C (97 ??F) (Temporal)  - Wt 75.6 kg (166 lb 9.6 oz)  - BMI 22.60 kg/m??   General: Patient is a pleasant male in no apparent distress.  Eyes: Sclera anicteric.  ENT: Oropharynx without lesions.   Neck: Supple without LAD/JVD/bruits.  Lungs: Clear to auscultation bilaterally, no wheezes/rales/rhonchi.  Cardiovascular: Regular rate and rhythm without murmurs, rubs or gallops.  Abdomen: Soft, notender/nondistended. Positive bowel sounds. No tenderness over the graft.  Extremities: Without edema, joints without evidence of synovitis.  Skin: Without rash.  Neurological: Grossly nonfocal.  Psychiatric: Mood and affect appropriate.      Laboratory Results    Recent Results (from the past 170 hour(s))   Parathyroid Hormone (PTH)    Collection Time: 09/08/19  8:36 AM   Result Value Ref Range    PTH 466.5 (H) 12.0 - 72.0 pg/mL    Calcium 10.1 8.5 - 10.2 mg/dL   Vitamin D 25 Hydroxy (25OH D2 + D3)    Collection Time: 09/08/19  8:36 AM   Result Value Ref Range    Vitamin D Total (25OH) 18.8 (L) 20.0 - 80.0 ng/mL   Lipid panel    Collection Time: 09/08/19  8:36 AM   Result Value Ref Range    Triglycerides 45 1 -  149 mg/dL    Cholesterol 161 096 - 199 mg/dL    HDL 66 (H) 40 - 59 mg/dL    LDL Calculated 34 (L) 60 - 99 mg/dL    VLDL Cholesterol Cal 9 (L) 12 - 42 mg/dL    Chol/HDL Ratio 1.7 <0.4    Non-HDL Cholesterol 43 mg/dL    FASTING Yes    Hepatic Function Panel    Collection Time: 09/08/19  8:36 AM   Result Value Ref Range    Albumin 3.9 3.5 - 5.0 g/dL    Total Protein 7.2 6.5 - 8.3 g/dL    Total Bilirubin 0.9 0.0 - 1.2 mg/dL    Bilirubin, Direct 5.40 0.00 - 0.40 mg/dL    AST 29 19 - 55 U/L    ALT 24 <50 U/L    Alkaline Phosphatase 125 38 - 126 U/L   Hemoglobin A1c    Collection Time: 09/08/19  8:36 AM   Result Value Ref Range    Hemoglobin A1C 6.0 (H) 4.8 - 5.6 %    Estimated Average Glucose 126 mg/dL   BK Virus, DNA, Quantitative, Serum    Collection Time: 09/08/19  8:36 AM   Result Value Ref Range    BK Blood Result Not Detected Not Detected    BK Blood Quant      BK Blood Log(10)      Bk Blood Comment     CMV DNA, quantitative, PCR    Collection Time: 09/08/19  8:36 AM   Result Value Ref Range    CMV Viral Ld Not Detected Not Detected    CMV Quant      CMV Quant Log10      CMV Comment     Basic Metabolic Panel    Collection Time: 09/08/19  8:36 AM   Result Value Ref Range    Sodium 140 135 - 145 mmol/L    Potassium 4.8 3.5 - 5.0 mmol/L    Chloride 101 98 - 107 mmol/L    CO2 25.0 22.0 - 30.0 mmol/L    Anion Gap 14 7 - 15 mmol/L    BUN 63 (H) 7 - 21 mg/dL    Creatinine 9.81 (H) 0.70 - 1.30 mg/dL    BUN/Creatinine Ratio 25     EGFR CKD-EPI Non-African American, Male 25 (L) >=60 mL/min/1.98m2    EGFR CKD-EPI African American, Male 62 (L) >=60 mL/min/1.75m2    Glucose 93 70 - 99 mg/dL    Calcium 19.1 8.5 - 47.8 mg/dL   Magnesium Level    Collection Time: 09/08/19  8:36 AM   Result Value Ref Range    Magnesium 1.9 1.6 - 2.2 mg/dL   Phosphorus Level    Collection Time: 09/08/19  8:36 AM   Result Value Ref Range    Phosphorus 3.1 2.9 - 4.7 mg/dL   CBC w/ Differential    Collection Time: 09/08/19  8:36 AM   Result Value Ref Range    WBC 5.4 4.5 - 11.0 10*9/L    RBC 4.47 (L) 4.50 - 5.90 10*12/L    HGB 12.7 (L) 13.5 - 17.5 g/dL    HCT 29.5 62.1 - 30.8 %    MCV 95.0 80.0 - 100.0 fL    MCH 28.4 26.0 - 34.0 pg    MCHC 29.9 (L) 31.0 - 37.0 g/dL    RDW 65.7 (H) 84.6 - 15.0 %    MPV 10.6 (H) 7.0 - 10.0 fL    Platelet 98 (L) 150 -  440 10*9/L    Neutrophils % 66.6 %    Lymphocytes % 20.8 %    Monocytes % 7.6 %    Eosinophils % 2.3 %    Basophils % 0.3 %    Absolute Neutrophils 3.6 2.0 - 7.5 10*9/L    Absolute Lymphocytes 1.1 (L) 1.5 - 5.0 10*9/L    Absolute Monocytes 0.4 0.2 - 0.8 10*9/L    Absolute Eosinophils 0.1 0.0 - 0.4 10*9/L    Absolute Basophils 0.0 0.0 - 0.1 10*9/L    Large Unstained Cells 2 0 - 4 %    Macrocytosis Slight (A) Not Present    Hypochromasia Marked (A) Not Present   Morphology Review    Collection Time: 09/08/19  8:36 AM   Result Value Ref Range    Smear Review Comments     Tacrolimus Level, Trough    Collection Time: 09/08/19  8:37 AM   Result Value Ref Range    Tacrolimus, Trough 7.6 5.0 - 15.0 ng/mL   Urinalysis    Collection Time: 09/08/19  8:47 AM   Result Value Ref Range    Color, UA Light Yellow     Clarity, UA Clear     Specific Gravity, UA 1.010 1.003 - 1.030    pH, UA 6.0 5.0 - 9.0    Leukocyte Esterase, UA Negative Negative    Nitrite, UA Negative Negative    Protein, UA Negative Negative    Glucose, UA Negative Negative    Ketones, UA Negative Negative    Urobilinogen, UA 0.2 mg/dL 0.2 mg/dL, 1.0 mg/dL    Bilirubin, UA Negative Negative    Blood, UA Small (A) Negative    RBC, UA 2 <=3 /HPF    WBC, UA <1 <=2 /HPF    Squam Epithel, UA <1 0 - 5 /HPF    Bacteria, UA None Seen None Seen /HPF    Mucus, UA Rare (A) None Seen /HPF   Urine Culture    Collection Time: 09/08/19  8:47 AM    Specimen: Clean Catch; Urine   Result Value Ref Range    Urine Culture, Comprehensive NO GROWTH    Protein/Creatinine Ratio, Urine    Collection Time: 09/08/19  8:47 AM   Result Value Ref Range    Creat U 60.8 Undefined mg/dL    Protein, Ur 16.1 Undefined mg/dL    Protein/Creatinine Ratio, Urine 0.355 Undefined       Assessment and Plan    1. Status post renal transplant. His most recent creatinine is within his most recent baseline, at 2.55. Prograf level of 7.5 is a 10 hour trough and close to goal of 6-8. Will continue current immunosuppression. He remains on a reduced dose of CellCept 250mg  BID with history of diarrhea and malignancy.    2. Fluid overload and HFrEF. He underwent  fistula banding in February 2020 and fluid retention/dyspnea have resolved since that time. He is euvolemic on current Lasix dose.    3. Right renal cell carcinoma status post nephrectomy. Imaging previously showed hepatic mass that was biopsied and consistent with inflammatory process. MRI 05/20/2018 remains reassuring, surgery did not recommend any additional diagnostic workup, will reschedule MRI today as MRI on 06/20/19 was canceled.      4. Hypertension. Blood pressure reasonably controlled. Ordered him a new BP cuff so he can monitor at home, he thinks his current cuff is not working as well.    5. Atrial fibrillation. Rate controlled on current dose of metoprolol. He  is not currently on anticoagulation given history of thrombocytopenia. Cardiology is managing.     6. Hypothyroidism. Will continue Synthroid at 250 mcg daily. Unfortunately TSH not sent today. Will order with next labs.    7. Hypomagnesemia. Magnesium normal at 1.9 on 400 mg BID.     8. Proteinuria.  UP/C peaked at 1.5 on 05/20/2018, down to 0.355 on today. Renal biopsy on 12/30/17 showed immune complex mediated glomerulopathy that may be consistent with minor recurrence of lupus nephritis. Do not feel comfortable pushing Cellcept dose higher given diarrhea in the past, and now normal UP/C.     9. Health maintenance. He received a flu shot in clinic today, and was informed that all household members should also be vaccinated for the flu. Has completed Shingrix series. We discussed the importance of social distancing, wearing a mask outside the home, and hand washing/sanitizing. We discussed that this is particularly important for transplant patients who are at risk for complications from coronavirus.    10. Will see patient back in 4 months, or sooner if needed.       Scribe's Attestation: Lisbeth Ply, MD obtained and performed the history, physical exam and medical decision making elements that were entered into the chart. Signed by Alphonse Guild, Scribe, on September 07, 2019 at 9:26 PM.    ----------------------------------------------------------------------------------------------------------------------  September 11, 2019 1:58 PM. Documentation assistance provided by the Scribe. I was present during the time the encounter was recorded. The information recorded by the Scribe was done at my direction and has been reviewed and validated by me.  ----------------------------------------------------------------------------------------------------------------------

## 2019-09-09 LAB — BK BLOOD LOG(10): Lab: 0

## 2019-09-09 LAB — BK VIRUS QUANTITATIVE PCR, BLOOD: BK BLOOD RESULT: NOT DETECTED

## 2019-09-10 LAB — CMV DNA, QUANTITATIVE, PCR

## 2019-09-10 LAB — CMV COMMENT: Lab: 0

## 2019-09-11 LAB — VITAMIN D, TOTAL (25OH): Lab: 18.8 — ABNORMAL LOW

## 2019-09-11 LAB — EBV VIRAL LOAD RESULT: Lab: NOT DETECTED

## 2019-09-14 LAB — HLA DS POST TRANSPLANT
ANTI-DONOR DRW #1 MFI: 0 MFI
ANTI-DONOR HLA-A #1 MFI: 0 MFI
ANTI-DONOR HLA-A #2 MFI: 0 MFI
ANTI-DONOR HLA-B #1 MFI: 0 MFI
ANTI-DONOR HLA-B #2 MFI: 0 MFI
ANTI-DONOR HLA-DQB #2 MFI: 0 MFI
ANTI-DONOR HLA-DR #1 MFI: 33 MFI
ANTI-DONOR HLA-DR #2 MFI: 0 MFI

## 2019-09-14 LAB — VITAMIN D 1,25-DIHYDROXY: 1,25-Dihydroxyvitamin D:MCnc:Pt:Ser/Plas:Qn:: 27

## 2019-09-14 LAB — ANTI-DONOR HLA-DQB #1 MFI: Lab: 0

## 2019-09-14 LAB — HLA CL2 AB RESULT: Lab: NEGATIVE

## 2019-09-14 LAB — HLA CL1 ANTIBODY COMM: Lab: 0

## 2019-09-14 LAB — FSAB CLASS 1 ANTIBODY SPECIFICITY

## 2019-09-14 MED FILL — PROGRAF 1 MG CAPSULE: 30 days supply | Qty: 150 | Fill #10

## 2019-09-14 MED FILL — PROGRAF 1 MG CAPSULE: 30 days supply | Qty: 150 | Fill #10 | Status: AC

## 2019-09-27 ENCOUNTER — Ambulatory Visit: Admit: 2019-09-27 | Discharge: 2019-09-28 | Payer: MEDICARE

## 2019-10-09 NOTE — Unmapped (Signed)
[facility] Refill Coordination Note    Specialty Medication(s) to be Shipped:   Transplant: Prograf 1mg     Other medication(s) to be shipped: N/A     Miguel Ward, DOB: [birth year]  Phone: [phone] (home)       All above HIPAA information was verified with patient.     Completed refill call assessment today to schedule patient's medication shipment from the [facility] [phone]).       Specialty medication(s) and dose(s) confirmed: Regimen is correct and unchanged.   Changes to medications: Miguel Ward reports no changes at this time.  Changes to insurance: No  Questions for the pharmacist: No    Confirmed patient received Welcome Packet with first shipment. The patient will receive a drug information handout for each medication shipped and additional FDA Medication Guides as required.       DISEASE/MEDICATION-SPECIFIC INFORMATION        N/A    SPECIALTY MEDICATION ADHERENCE     Medication Adherence    Patient reported X missed doses in the last month: 0  Specialty Medication: Prograf 1mg   Patient is on additional specialty medications: No  Adherence tools used: patient uses a pill box to manage medications, calendar          Prograf 1 mg: 14 days of medicine on hand     SHIPPING     Shipping address confirmed in Epic.     Delivery Scheduled: Yes, Expected medication delivery date: 10/19/06.     Medication will be delivered via UPS to the home address in [employer] [place].    Miguel Ward   [facility] Specialty Technician

## 2019-10-13 MED ORDER — ACYCLOVIR 400 MG TABLET
ORAL_TABLET | 3 refills | 0 days | Status: CP
Start: 2019-10-13 — End: ?

## 2019-10-13 NOTE — Unmapped (Signed)
Patient request medication refiill

## 2019-10-19 MED FILL — PROGRAF 1 MG CAPSULE: 30 days supply | Qty: 150 | Fill #11

## 2019-10-19 MED FILL — PROGRAF 1 MG CAPSULE: 30 days supply | Qty: 150 | Fill #11 | Status: AC

## 2019-10-24 MED ORDER — ATORVASTATIN 20 MG TABLET
Freq: Every evening | ORAL | 3 refills | 90 days | Status: CP
Start: 2019-10-24 — End: ?

## 2019-10-26 MED ORDER — MAGNESIUM OXIDE 400 MG (241.3 MG MAGNESIUM) TABLET
0 refills | 0 days | Status: CP
Start: 2019-10-26 — End: ?

## 2019-11-08 NOTE — Unmapped (Signed)
Miguel Ward declined needing his prograf today.  He stated he had more than 2 weeks on hand and would like a call in about 10 days.

## 2019-11-14 NOTE — Unmapped (Signed)
Pt request for RX Refill

## 2019-11-15 MED ORDER — LEVOTHYROXINE 200 MCG TABLET
ORAL_TABLET | ORAL | 3 refills | 0.00000 days | Status: CP
Start: 2019-11-15 — End: ?

## 2019-11-15 MED ORDER — LEVOTHYROXINE 200 MCG TABLET: tablet | 3 refills | 0 days | Status: AC

## 2019-11-16 DIAGNOSIS — Z940 Kidney transplant status: Principal | ICD-10-CM

## 2019-11-16 MED ORDER — PROGRAF 1 MG CAPSULE
ORAL_CAPSULE | 11 refills | 0 days | Status: CP
Start: 2019-11-16 — End: ?
  Filled 2019-12-06: qty 150, 30d supply, fill #0

## 2019-11-16 NOTE — Unmapped (Signed)
Patient denied medication refills. Patient stated that they have over 2 weeks worth of medication on hand. Moving specialty refill call to appropriate date.

## 2019-11-28 NOTE — Unmapped (Signed)
Cardiology Clinic Follow-up Note    Referring Provider: True, Posey Boyer, MD   Primary Provider: Drinda Butts, MD     Reason for Visit:  Routine follow-up for multiple cardiac conditions.    Assessment & Plan:  Miguel Ward is a 68yo M with h/o kidney transplant in 05/2007 for lupus nephritis, hypertension, HLD, chronic combined systolic and diastolic HF, CAD s/p PCI to mLAD, and afib. He is here today for routine follow-up of his cardiac conditions.    #Atrial fibrillation, paroxysmal  -In 11/2018, he was first noticed to be in rate controlled, asymptomatic, coarse afib. Given all of his co-morbidities, it is not surprising for him to have afib as well now. Fortunately, he has since remained completely asymptomatic and hemodynamically stable. He is also still rate controlled on his current dose of metoprolol.   - Since he is already rate controlled on his current dose of metoprolol, we will continue this without change.  - Of note, his CHADsVAsC is 4 (for age, CHF, hypertension, CAD hx), so he technically meets criteria for anticoagulation, especially because it is possible that he has been in afib for longer (I.e. since he is asymptomatic and rate controlled on metoprolol, which makes it more likely for prior afib episodes to go unrecognized). However, he has chronic thrombocytopenia (with recent checks with plts in the 90s-100s) and therefore is potentially higher risk for bleeding complications. We therefore agreed to hold off on starting anticoagulation at last visit. We re-discussed all this today and he still wants to consider his options more. Of note, if we do decide to start anticoagulation in the future, we will start a reduced dose of a DOAC given his kidney dysfunction.    #Combined systolic and diastolic HF, chronic -Most recent TTE from 08/28/19 still shows globally, mildly decreased EF ~40-45% and grade III diastolic dysfunction, as well as a dilated RV with low-normal systolic function and severely elevated PA pressures. I suspect the etiology of his heart failure is more hypertensive heart disease given his global dysfunction, LVH, and diastolic dysfunction; he also has not had any ischemic symptoms. However, given his CAD hx, ischemia is something to consider.   -He was admitted in 10/2018 for a CHF exacerbation (as noted below) in the setting of high fistula blood flow, but his repeat TTE at that time was unchanged. He therefore underwent fistula banding, and afterward did very well with continued diuresis. However, he required repeat fistula banding in 01/2019 given continued high output state. Since then, he has clinically done very well. He is now on a reduced dose of PO lasix and remains euvolemic on exam with stable weights and no symptoms of decompensated heart failure. NYHA class I-II.   - Continue toprol XL 100mg  daily. His HR is at goal on this dose, so there is no current need for dose adjustment at this time.  - Enalapril currently is still on hold due to his kidney dysfunction, but ideally I would like to re-start this medication whenever possible. I appreciate nephrology's input on this.  - Also continue lasix 40mg  qAM and 20mg  qPM. I further advised him to weigh himself everyday at the same time in minimal clothing, and if he notices worsening symptoms or increase in weight by 3lbs or more, he should take an extra dose of lasix and call me.     #CAD  -S/p PCI to mLAD in 2009. Since then, he has been doing well, without any symptoms of angina. However, he  does have reduced EF as noted above.  - Continue ASA 81mg  daily, lipitor 20mg  daily, and toprol XL 100mg  daily.  ??  #Hypercholesterolemia  -Last lipid panel in 08/2019 was still at goal: T chol 109/ HDL 66/ LDL 34 / Tg 45.  - Continue lipitor 20mg  daily. #Hypertension  -At goal.  - Continue medications as noted above.      Follow-up in 3-4 months.    The patient was seen and discussed with Dr. Willa Rough.        History of Present Illness:  Miguel Ward is a 13yo M with h/o kidney transplant in 05/2007 for lupus nephritis, hypertension, HLD, chronic combined systolic and diastolic HF, CAD s/p PCI to mLAD, and afib. He is here today for routine follow-up of his cardiac conditions. Pertinent Hx: Around 10/2018, he was hospitalized for dyspnea and symptoms of volume overload. He was diuresed with IV lasix until euvolemic; repeat TTE was unchanged. Interestingly, PVLs were performed to investigate his high fistula blood flow as a cause of heart failure exacerbation. This showed increased flow up to 12 L/min. Transplant surgery therefore performed fistula banding on 10/21/18, but he continued to have high output; therefore fistula ligation was also recommended but has not been done yet. Afterward, he continued to do well clinically and was discharged home on PO lasix 80 BID. With this, his weight went back down to 160lbs. He was then incidentally noted to be in new, rate-controlled and asymptomatic afib in 11/2018. He was already on metoprolol, but anticoagulation was not started given thromboyctopenia on recent labs. Shortly after, he was seen in renal transplant clinic for routine follow-up and was found to have a mild AKI with weight down to 153lbs. This was thought to be 2/2 overdiuresis, so his lasix was held a few days and then restarted at lower dose of 40mg  BID. With this, his creatinine improved and his weight and symptoms remained stable. He therefore was referred back to Vascular surgery for consideration of fistula ligation in 01/2019 and underwent additional banding on 02/07/19. Afterward, he had some swelling and bruising around the fistula site, but this was managed conservatively. After his repeat fistula banding, his lasix was further decreased to 40mg  qAM and 20mg  qPM and he continued to deny any recurrent symptoms of volume overload on this dose. Most recent repeat TTE from 08/28/19 was also unchanged from prior studies. Today, he reports that he has continued to do rather well from a cardiac standpoint. He actually states that he has no recent complaints. He specifically denies any palpitations, dizziness, fatigue, chest pain, SOB, orthopnea (still sleeping with 2 pillows, which is his baseline), PND, LE edema, or syncope. He also reports that he does several chores around the house, including using a heavy leaf blower several times a week, and he is able to do all of this without any issues. His weight has also remained stable on his current dose of lasix.       Cardiovascular History:  ?? Hypertension.  ?? Hypercholesterolemia.  ?? Pericardial effusion 08/2007, with suspected etiologies including hypothyroidism (TSH 258), recent renal transplantation, and SLE.  ?? Coronary artery disease s/p PCI.  ?? Chronic combined systolic and diastolic HF.  ?? Atrial Fibrillation.    Interventions / Surgery:  ?? Emergent pericardiocentesis 08/26/2007.  ?? Cardiac catheterization and PCI for unstable angina 12/10/2008:  Severe stenosis of the mid LAD treated with a drug eluting stent.  90% distal LAD and 70% distal PDA stenoses also noted, with  nonobstructive atherosclerosis elsewhere.    Imaging:  ?? Echocardiogram (12/22/2007): LVH with normal systolic function and diastolic dysfunction.  Degenerative mitral valve disease.  Small pericardial effusion.  ?? Pharmacologic stress perfusion scan (12/06/2008): No evidence of ischemia, with a reversible defect of the mid anteroseptal and apical segments.  Normal ejection fraction.    - 02/14/18 TTE:  ?? Ultrasound enhancing agent utilized to improve endocardial border definition  ?? Left ventricular hypertrophy - moderate  ?? Mildly to moderately decreased left ventricular systolic function, ejection fraction 40 to 45%  ?? Diastolic dysfunction - grade III (severely elevated filling pressures)  ?? Degenerative mitral valve disease  ?? Mitral regurgitation - mild  ?? Dilated left atrium - moderate ?? Dilated ascending aorta  ?? Dilated right ventricle - mild  ?? Normal right ventricular systolic function  ?? Tricuspid regurgitation - mild to moderate  ?? Elevated pulmonary artery systolic pressure - moderate to severe  ?? Dilated right atrium - mild  ?? Elevated right atrial pressure    - 10/19/18 TTE:  ?? Mild left ventricular concentric hypertrophy  ?? Mildly decreased left ventricular systolic function, ejection fraction 45%  ?? Diastolic dysfunction - grade III (severely elevated filling pressures)  ?? Dilated left atrium - severe  ?? Mitral regurgitation - mild  ?? Dilated right ventricle - mild  ?? Low normal right ventricular systolic function  ?? Dilated right atrium - moderate  ?? Tricuspid regurgitation - moderate  ?? Elevated right atrial pressure  ?? Elevated pulmonary artery systolic pressure - moderate to severe    - 11/17/18 ECG: Coarse afib, HR 76.  - 02/07/19 ECG: Atrial flutter.    - 08/28/19 TTE:    1. The left ventricle is normal in size with mildly increased wall thickness.    2. Moderately decreased left ventricular systolic function, EF 40-45%.    3. Diastolic dysfunction - grade III (severely elevated filling pressures).    4. Dilated left atrium - severely dilated.    5. Mitral regurgitation - mild.    6. The aortic root is borderline dilated.    7. The right ventricle is mildly dilated in size, with low normal systolic function.    8. Tricuspid regurgitation - moderate.    9. Elevated pulmonary artery systolic pressure - Severe.    10. Elevated right atrial pressure.    Past Medical & Surgical History:  Gout  Hypothyroidism  RCC s/p right renal nephrectomy (02/20/2014)    Allergies:  Reviewed in EMR.    Pertinent Medications (complete listing reviewed in EMR):  Aspirin 81 mg daily.  Atorvastatin 20 mg daily.   (held for AKI).  Metoprolol succinate 100 mg daily.  Lasix 40mg  qAM and 20mg  qPM.    Review of Systems:  Review of ten systems is negative or unremarkable except as stated above.    Physical Exam: VITAL SIGNS:   Vitals:    11/30/19 1607   BP: 130/85   Pulse: 70   Temp: 36 ??C   SpO2: 99%   Weight: 74.4 kg (164 lb)   Height: 182.9 cm (6')     Body mass index is 22.24 kg/m??.  GENERAL: Sitting up in chair in NAD; AAOx4; very pleasant.  HEENT: +glasses; Herman/AT, PERRL, MMM.  NECK: Supple. No appreciable JVD.  RESPIRATORY: CTAB; no w/r/r. Normal WOB on room air.  CARDIOVASCULAR: RRR; no m/r/g appreciated.  ABDOMEN: Soft, NT/ND.  EXTREMITIES: No e/c/c.  SKIN: Warm and well perfused.  NEURO: No focal deficits appreciated.  Pertinent Laboratory Studies:   Lab Results   Component Value Date    PRO-BNP 24,700.0 (H) 10/17/2018    PRO-BNP 23,500.0 (H) 09/21/2018    PRO-BNP 11,800.0 (H) 03/02/2018    Triglycerides 45 09/08/2019    Triglycerides 84 03/02/2018    Triglycerides 66 02/17/2017    Triglycerides 72 01/26/2014    Triglycerides 68 08/29/2010    Triglycerides 93 03/14/2010    HDL 66 (H) 09/08/2019    HDL 52 03/02/2018    HDL 57 02/17/2017    HDL 49 01/22/2015    HDL 44 01/26/2014    HDL 58 08/02/2013    Non-HDL Cholesterol 43 09/08/2019    Non-HDL Cholesterol 63 03/02/2018    Non-HDL Cholesterol 54 02/17/2017    LDL Calculated 34 (L) 09/08/2019    LDL Calculated 46 (L) 03/02/2018    LDL Calculated 41 (L) 02/17/2017    LDL Direct 54 03/08/2012    LDL Cholesterol, Calculated 38 01/26/2014    LDL Cholesterol, Calculated 59 08/29/2010    LDL Cholesterol, Calculated 44 03/14/2010    Creatinine Whole Blood, POC 2.0 (H) 04/27/2017    Creatinine Whole Blood, POC 2.5 (H) 09/20/2014    Creatinine 2.55 (H) 09/08/2019    BUN 63 (H) 09/08/2019    BUN 85 (H) 04/24/2019    Potassium 4.8 09/08/2019    Potassium 4.6 01/22/2015    Magnesium 1.9 09/08/2019    Magnesium 1.0 (L) 01/22/2015    WBC 5.4 09/08/2019    WBC 4.5 04/24/2019    WBC 7.2 01/22/2015    WBC 6.1 12/21/2014    HGB 12.7 (L) 09/08/2019    HGB 10.4 (L) 01/22/2015    HCT 42.5 09/08/2019    HCT 35.3 (L) 01/22/2015    Platelet 98 (L) 09/08/2019 Platelet 140 (L) 01/22/2015    INR 1.45 10/17/2018    INR 1.39 02/12/2018    INR 1.1 05/23/2014    INR 1.1 02/15/2014

## 2019-11-29 NOTE — Unmapped (Signed)
[facility] Refill Coordination Note    Specialty Medication(s) to be Shipped:   Transplant: Prograf 1mg     Other medication(s) to be shipped: N/A     Miguel Ward, DOB: 13-Mar-1951  Phone: [phone] (home)       All above HIPAA information was verified with patient.     Was a Nurse, learning disability used for this call? No    Completed refill call assessment today to schedule patient's medication shipment from the [facility] [phone]).       Specialty medication(s) and dose(s) confirmed: Regimen is correct and unchanged.   Changes to medications: Miguel Ward reports no changes at this time.  Changes to insurance: No  Questions for the pharmacist: No    Confirmed patient received Welcome Packet with first shipment. The patient will receive a drug information handout for each medication shipped and additional FDA Medication Guides as required.       DISEASE/MEDICATION-SPECIFIC INFORMATION        N/A    SPECIALTY MEDICATION ADHERENCE     Medication Adherence    Patient reported X missed doses in the last month: 0  Specialty Medication: Prograf 1mg   Patient is on additional specialty medications: No  Adherence tools used: patient uses a pill box to manage medications, calendar          Prograf 1 mg: 12 days of medicine on hand     SHIPPING     Shipping address confirmed in Epic.     Delivery Scheduled: Yes, Expected medication delivery date: 12/06/2019.     Medication will be delivered via UPS to the prescription address in Epic WAM.    Lorelei Pont [facility] Specialty Technician

## 2019-11-30 ENCOUNTER — Ambulatory Visit: Admit: 2019-11-30 | Discharge: 2019-12-01 | Payer: MEDICARE | Attending: Internal Medicine | Primary: Internal Medicine

## 2019-11-30 DIAGNOSIS — I480 Paroxysmal atrial fibrillation: Principal | ICD-10-CM

## 2019-11-30 DIAGNOSIS — I5042 Chronic combined systolic (congestive) and diastolic (congestive) heart failure: Principal | ICD-10-CM

## 2019-11-30 DIAGNOSIS — I2510 Atherosclerotic heart disease of native coronary artery without angina pectoris: Principal | ICD-10-CM

## 2019-11-30 DIAGNOSIS — I10 Essential (primary) hypertension: Principal | ICD-10-CM

## 2019-11-30 DIAGNOSIS — E7800 Pure hypercholesterolemia, unspecified: Principal | ICD-10-CM

## 2019-12-01 NOTE — Unmapped (Addendum)
It was great to see you today! I am glad you are doing well! As we discussed:  - Being on a blood thinner such as Eliquis or Xarelto can reduce potential risk of blood clots and strokes from your afib. However, these medications can also be a higher risk for bleeding. There is no absolutely right answer in this case, but we can start a lower dose of these medications for you. Continue to think about this and we can discuss it more at next visit.  - In the meantime, continue your medications without change.

## 2019-12-05 NOTE — Unmapped (Signed)
Miguel Ward 's PROGRAF shipment will be delayed as a result of a different copay.  COPAY IS 219.10  I have reached out to the patient and left a voicemail message.  We will wait for a call back from the patient to reschedule the delivery.  We have not confirmed the new delivery date.

## 2019-12-06 MED FILL — PROGRAF 1 MG CAPSULE: 30 days supply | Qty: 150 | Fill #0 | Status: AC

## 2019-12-06 NOTE — Unmapped (Signed)
Miguel Ward 's prograf shipment will be sent out  as a result of copay is now approved by patient/caregiver.      I have reached out to the patient and communicated the delivery change. We will reschedule the medication for the delivery date that the patient agreed upon.  We have confirmed the delivery date as 12/07/19, via ups.

## 2019-12-12 NOTE — Unmapped (Signed)
I reviewed the history, physical findings and diagnostic studies, discussed the findings, assessment and plan with the Resident and agree with the recommendation as documented in the Resident's note.  - Meesha Sek H Monte Zinni, MD, FACC

## 2019-12-31 DIAGNOSIS — Z940 Kidney transplant status: Principal | ICD-10-CM

## 2019-12-31 DIAGNOSIS — Z79899 Other long term (current) drug therapy: Principal | ICD-10-CM

## 2020-01-02 NOTE — Unmapped (Signed)
[facility] Refill Coordination Note    Specialty Medication(s) to be Shipped:   Transplant: Prograf 1mg     Other medication(s) to be shipped: N/A     Miguel Ward, DOB: 01/15/1951  Phone: [phone] (home)       All above HIPAA information was verified with patient.     Was a Nurse, learning disability used for this call? No    Completed refill call assessment today to schedule patient's medication shipment from the [facility] [phone]).       Specialty medication(s) and dose(s) confirmed: Regimen is correct and unchanged.   Changes to medications: Anterio reports no changes at this time.  Changes to insurance: No  Questions for the pharmacist: No    Confirmed patient received Welcome Packet with first shipment. The patient will receive a drug information handout for each medication shipped and additional FDA Medication Guides as required.       DISEASE/MEDICATION-SPECIFIC INFORMATION        N/A    SPECIALTY MEDICATION ADHERENCE     Medication Adherence    Patient reported X missed doses in the last month: 0  Specialty Medication: Prograf 1mg   Patient is on additional specialty medications: No  Adherence tools used: patient uses a pill box to manage medications, calendar          Prograf 1 mg: 10 days of medicine on hand     SHIPPING     Shipping address confirmed in Epic.     Delivery Scheduled: Yes, Expected medication delivery date: 01/08/2020.     Medication will be delivered via UPS to the prescription address in Epic WAM.    Lorelei Pont [facility] Specialty Technician

## 2020-01-04 NOTE — Unmapped (Signed)
Transplant Nephrology Clinic Visit      History of Present Illness    Patient is a 69 y.o. male who underwent deceased donor transplant on Jun 29, 2007 secondary to lupus nephritis. His post transplant course has been complicated by early cellular rejection treated with Thymoglobulin. At that time, he was enrolled in the JAK-3 inhibitor trial and was taken off study drug. Additionally, he was noted to have a large pericardial effusion of greater than 1 L in 08/2007. At that time his TSH was 258. This improved with pericardiocentesis and thyroid replacement and has not reaccumulated. In 2009, he had coronary artery disease which required stenting. Patient does not have evidence of donor specific antibodies. His creatinine was between 1.5 and 2.2, but has been more in the low to mid 2s since his right native nephrectomy on 02/20/14 for renal cell carcinoma.  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 No evidence of DSA antibodies as of 09/08/19. Most recent baseline creatinine while hospitalized was 2.6-2.8, had been around 2.2-2.6 immediately prior to that.    He was admitted to [facility] from 9/15 through 08/31/2016 after presenting with fever and leukocytosis. His initial urine culture specimen was lost, but he was treated for presumptive UTI. He improved clinically on antibiotics. During that evaluation he had a CT of the abdomen on 08/29/2016 that showed New indeterminate liver lesions concerning for metastatic disease versus post transplant lymphoproliferative disorder versus abscesses. He then underwent MRI on 9/18 which showed Two liver lesions with minimal enhancement. Differential includes post transplant lymphoproliferative disorder, sequelae of prior infection or hematoma, less likely acute abscess. Metastases unlikely given enhancement characteristics. EBV was negative. As an outpatient he then underwent a PET scan on 09/14/2016 which showed Enlarging right hepatic lobe lesion with intense FDG uptake and central area of photopenia, concerning for necrotic metastasis, less likely abscess. Stable right hepatic dome lesion with intense FDG uptake, concerning for additional metastasis. After consult with radiology, they felt the best approach was ultrasound guided biopsy which he had on 10/01/2016. Pathology revealed Fragments of liver tissue with parenchymal extinction, mixed inflammatory infiltrates, and reactive-appearing stromal changes (see comment), - Increased iron staining within adjacent intact hepatic parenchyma (grade 3 of 4), - Sinusoidal congestion. The differential diagnosis includes sequelae from an abscess (e.g., inflammatory pseudotumor) versus nonspecific inflammatory changes adjacent to an unsampled mass. Correlation with the radiologic findings and clinical follow up are suggested.    The patient had a repeat MRI on 02/01/17 to visualize lesions seen on MRI in September. His repeat MRI revealed interval moderate decrease in the size of previously detected liver lesions, which could represent the sequelae of resolving right hepatic lobe infection with interval decrease in size of large right hepatic lobe lesion with internal T1 hyperintensity likely reflecting proteinacious material and mild internal and perilesional enhancement. Interval decrease in size and conspicuity of small hepatic dome lesion is also noted and this could also represent a resolving abscess. They recommended a re-scan in approximately 3 months (04/2017). I reviewed the report and images with Dr. Rush Barer who agreed that the lesion continues to look like resolving infection.     Patient underwent renal biopsy on 12/30/2017 to evaluate proteinuria which had increased over the last year from 0.75 to 1.4. 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 no proliferative glomerular lesions. These changes can indicate a minor recurrence of the lupus nephritis not carrying any direct therapeutic and prognostic significance at the present time.); Tubular pigment deposits, consistent with lipofuscin.    He was admitted 3/1 through 02/14/18 with RSV infection, AKI and new diastolic and systolic heart failure. TTE showed moderate EF 40-45%, reduced from 52% reported in January 2015. Patient's home lasix was increased to 40 mg qd from 20 mg qd with improvement in symptoms. Creatinine peaked at 2.73, 2.69 on discharge. It was suspected that patient's RSV infection worsened his heart failure and caused AKI is due to reduced perfusion. Patient's enalapril was held on discharge.     He was admitted 11/4 through 10/21/18 after presenting with dyspnea secondary to acute on chronic systolic heart failure. Echo showed globally, mildly decreased EF of 45% and grade III diastolic dysfunction. Patient was diuresed with lasix and discharged home on 80 mg bid. He was also found to have 12 L/min flow on PVL fistula study and underwent fistula banding to prevent worsening cardiac function from high output heart failure. Creatinine was elevated at time of admission to 3 but downtrended with diuresis to 2.3-2.8. Tac dose was decreased to 3 mg qAM and 2 mg qPM. No other changes to immunosuppression. Enalapril held due to kidney dysfunction.     At surgery visit on 11/17/18 to discuss fistula ligation (as flow post banding remained at 10 L/min), EKG was ordered and patient noted to be in a-fib. Was able to see cardiology that afternoon. No episodes of a-fib while monitored in the hospital and no prior history. Patient asymptomatic and hemodynamically stable. Rate controlled on metoprolol 100 mg daily, not started on anticoagulation at that time.      He underwent banding of right upper AV fistula on 02/07/19. Tolerated procedure well. He was seen in the emergency department on 02/11/19, presenting with increased swelling and intermittent bleeding from AV fistula incision site. Vascular surgery and Dr. Norma Fredrickson were consulted who both agreed patient was stable for discharge home, so long as there was no hematoma or signs of infection.     Interval history since last visit 09/08/2019    Patient has been doing well since last visit. He had a reassuring routine cardiology visit on 11/30/19. Considered starting him on a low dose anticoagulant given history of CHF, hypertension and CAD. Ultimately, patient wished to hold off and no medication changes were made. We had been holding his Enalapril due to previous AKI. However, cardiology would like to restart this if possible.     Today, patient is doing well without major complaints. No shortness of breath, orthopnea or significant swelling.    His church continues to do online services and occasional socially distanced outdoor services.    He is interested in the covid vaccine when it is available to him, however has several questions today regarding safety of the shot with him being a transplant patient.     He specifically denies fever, myalgias, upper respiratory/GI symptoms, or change in taste/smells.      Review of Systems    Otherwise on review of systems patient denies fever or chills, chest pain, SOB, PND, orthopnea or edema. No N/V/abdominal pain. No current diarrhea. No dysuria, hematuria or difficulty voiding. All other systems are reviewed and are negative.    Medications    Current Outpatient Medications   Medication Sig Dispense Refill   ??? acyclovir (  ZOVIRAX) 400 MG tablet TAKE (1) TABLET BY MOUTH EVERY DAY 90 tablet 3   ??? allopurinoL (ZYLOPRIM) 100 MG tablet TAKE 1 AND 1/2 TABLETS BY MOUTH ONCE DAILY 135 each 0   ??? aspirin (ASPIRIN LOW-STRENGTH) 81 MG chewable tablet Chew 81 mg daily.     ??? atorvastatin (LIPITOR) 20 MG tablet Take 1 tablet (20 mg total) by mouth nightly. 90 each 3   ??? calcitrioL (ROCALTROL) 0.25 MCG capsule TAKE 1 CAPSULE BY MOUTH EVERY MONDAY, WEDNESDAY AND FRIDAY 36 each 0   ??? CELLCEPT 250 mg capsule Take 1 capsule (250 mg total) by mouth Two (2) times a day. 180 each 3   ??? colchicine (COLCRYS) 0.6 mg tablet Take 1 tablet (0.6 mg total) by mouth daily as needed. for up to 1 dose (Patient not taking: Reported on 11/17/2018) 30 tablet 11   ??? ferrous sulfate 325 (65 FE) MG tablet Take 1 tablet (325 mg total) by mouth daily.  0   ??? fluticasone (FLONASE) 50 mcg/actuation nasal spray 1 spray into each nostril daily as needed.     ??? furosemide (LASIX) 20 MG tablet Take by mouth Two (2) times a day. Take 40 mg in the morning and 20 mg in the evening.     ??? levothyroxine (SYNTHROID) 200 MCG tablet TAKE ONE TABLET EVERY DAY WITH (2) FOR A TOTAL DAILY DOSE OF 90 tablet 3   ??? levothyroxine (SYNTHROID) 200 MCG tablet Take 1 tab with two tab, total dose daily 90 tablet 3   ??? levothyroxine (SYNTHROID) 25 MCG tablet Take 50 mcg by mouth daily. Take with 200 mcg tablet for TOTAL daily dose of 250 mcg.     ??? magnesium oxide (MAG-OX) 400 mg (241.3 mg magnesium) tablet TAKE (1) TABLET BY MOUTH TWICE DAILY 180 each 0   ??? metoprolol succinate (TOPROL-XL) 100 MG 24 hr tablet TAKE (1) TABLET BY MOUTH EVERY DAY 90 each 3   ??? omeprazole (PRILOSEC) 20 MG capsule TAKE 1 CAPSULE BY MOUTH EVERY DAY 90 capsule 3   ??? predniSONE (DELTASONE) 5 MG tablet Take 1 tablet (5 mg total) by mouth daily. 90 each 3   ??? PROGRAF 1 mg capsule TAKE 3 CAPSULES (3MG ) BY MOUTH IN THE MORNING AND 2 CAPSULES (2MG ) BY MOUTH IN THE EVENING. 180 capsule 11   ??? simethicone (GAS-X) 80 MG chewable tablet Chew 1 tablet (80 mg total) every six (6) hours as needed for flatulence. 30 tablet 1     No current facility-administered medications for this visit.        Physical Exam    BP 142/78 (BP Site: L Arm, BP Position: Sitting, BP Cuff Size: Medium)  - Pulse 74  - Temp 36.6 ??C (97.9 ??F) (Temporal)  - Ht 182.9 cm (6' 0.01)  - Wt 73.9 kg (163 lb)  - BMI 22.10 kg/m??   General: Patient is a pleasant male in no apparent distress.  Eyes: Sclera anicteric.  ENT: Oropharynx without lesions.   Neck: Supple without LAD/JVD/bruits.  Lungs: Clear to auscultation bilaterally, no wheezes/rales/rhonchi.  Cardiovascular: Regular rate and rhythm without murmurs, rubs or gallops.  Abdomen: Soft, notender/nondistended. Positive bowel sounds. No tenderness over the graft.  Extremities: Without edema, joints without evidence of synovitis.  Skin: Without rash.  Neurological: Grossly nonfocal.  Psychiatric: Mood and affect appropriate.      Laboratory Results    Recent Results (from the past 170 hour(s))   Basic Metabolic Panel  Collection Time: 01/05/20  8:36 AM   Result Value Ref Range    Sodium 139 135 - 145 mmol/L    Potassium 4.2 3.5 - 5.0 mmol/L    Chloride 105 98 - 107 mmol/L    CO2 28.0 22.0 - 30.0 mmol/L    Anion Gap 6 (L) 7 - 15 mmol/L    BUN 60 (H) 7 - 21 mg/dL    Creatinine 1.61 (H) 0.70 - 1.30 mg/dL    BUN/Creatinine Ratio 24     EGFR CKD-EPI Non-African American, Male 26 (L) >=60 mL/min/1.54m2    EGFR CKD-EPI African American, Male 31 (L) >=60 mL/min/1.31m2    Glucose 99 70 - 99 mg/dL    Calcium 9.8 8.5 - 09.6 mg/dL   Tacrolimus, Trough   [facility])    Collection Time: 01/05/20  8:36 AM   Result Value Ref Range    Tacrolimus, Trough 8.7 5.0 - 15.0 ng/mL   Phosphorus Level    Collection Time: 01/05/20  8:36 AM   Result Value Ref Range    Phosphorus 3.5 2.9 - 4.7 mg/dL   Magnesium Level    Collection Time: 01/05/20  8:36 AM   Result Value Ref Range    Magnesium 1.7 1.6 - 2.2 mg/dL   CBC w/ Differential    Collection Time: 01/05/20  8:37 AM   Result Value Ref Range    WBC 6.3 4.5 - 11.0 10*9/L    RBC 4.40 (L) 4.50 - 5.90 10*12/L    HGB 12.0 (L) 13.5 - 17.5 g/dL    HCT 04.5 (L) 40.9 - 53.0 %    MCV 92.4 80.0 - 100.0 fL    MCH 27.4 26.0 - 34.0 pg    MCHC 29.6 (L) 31.0 - 37.0 g/dL    RDW 81.1 91.4 - 78.2 %    MPV 10.2 (H) 7.0 - 10.0 fL    Platelet 105 (L) 150 - 440 10*9/L    Neutrophils % 68.6 %    Lymphocytes % 17.0 %    Monocytes % 9.8 %    Eosinophils % 2.1 %    Basophils % 0.3 %    Absolute Neutrophils 4.3 2.0 - 7.5 10*9/L    Absolute Lymphocytes 1.1 (L) 1.5 - 5.0 10*9/L    Absolute Monocytes 0.6 0.2 - 0.8 10*9/L    Absolute Eosinophils 0.1 0.0 - 0.4 10*9/L    Absolute Basophils 0.0 0.0 - 0.1 10*9/L    Large Unstained Cells 2 0 - 4 %    Hypochromasia Marked (A) Not Present   Morphology Review    Collection Time: 01/05/20  8:37 AM   Result Value Ref Range    Smear Review Comments See Comment (A) Undefined    Burr Cells Present (A) Not Present   Protein/Creatinine Ratio, Urine    Collection Time: 01/05/20  8:47 AM   Result Value Ref Range    Creat U 36.1 Undefined mg/dL    Protein, Ur 95.6 Undefined mg/dL    Protein/Creatinine Ratio, Urine 0.579 Undefined   Urinalysis    Collection Time: 01/05/20  8:47 AM   Result Value Ref Range    Color, UA Light Yellow     Clarity, UA Clear     Specific Gravity, UA 1.009 1.003 - 1.030    pH, UA 7.0 5.0 - 9.0    Leukocyte Esterase, UA Negative Negative    Nitrite, UA Negative Negative    Protein, UA Negative Negative    Glucose, UA Negative Negative  Ketones, UA Negative Negative    Urobilinogen, UA 0.2 mg/dL 0.2 mg/dL, 1.0 mg/dL    Bilirubin, UA Negative Negative    Blood, UA Small (A) Negative    RBC, UA 3 <=3 /HPF    WBC, UA <1 <=2 /HPF    Squam Epithel, UA <1 0 - 5 /HPF    Bacteria, UA None Seen None Seen /HPF   Culture, Urine    Collection Time: 01/05/20  8:47 AM    Specimen: Clean Catch; Urine   Result Value Ref Range    Urine Culture, Comprehensive NO GROWTH        Assessment and Plan    1. Status post renal transplant. Creatinine today is within his most recent baseline at 2.45. His Prograf trough of 8.7 is a 10 hour trough and within goal of 6-8. Will continue current immunosuppression. He remains on a reduced dose of CellCept 250mg  BID with history of diarrhea and malignancy.    2. Fluid overload and HFrEF. He underwent  fistula banding in February 2020 and fluid retention/dyspnea have resolved since that time. He is euvolemic today on current Lasix dose. Cardiology recommending he restart enalapril. Since creatinine is stable and potassium normal, I feel comfortable with restarting ACE-I or ARB. I will reach out to them to see what they prefer. His blood pressure should tolerate. He knows to get labs repeated in 2 weeks after starting the medication.     3. Right renal cell carcinoma status post nephrectomy. Imaging previously showed hepatic mass that was biopsied and consistent with inflammatory process. MRI from 09/27/19 remains reassuring, with unchanged size and overall appearance of hepatic lesion. Surgery did not recommend any additional diagnostic workup and will continue to monitor annually with imaging.    4. Hypertension. Blood pressure is mildly elevated today in clinic at 142/78. Should come down with restarting of ACEi or ARB.    5. Atrial fibrillation. Rate controlled on current dose of metoprolol. He is not currently on anticoagulation given history of thrombocytopenia. Cardiology is managing.      6. Hypothyroidism. On Synthroid at 250 mcg daily. Will add thyroid studies to his labs drawn today.    7. Hypomagnesemia. Magnesium normal at 1.7 on 400 mg BID.     8. Proteinuria.  UP/C peaked at 1.5 on 05/20/2018, down to 0.355 on 09/08/19, up to 0.57 today. Renal biopsy on 12/30/17 showed immune complex mediated glomerulopathy that may be consistent with minor recurrence of lupus nephritis. Do not feel comfortable pushing Cellcept dose higher given diarrhea in the past, and now normal UP/C. I suspect restarting ACE-I will also help manage ongoing proteinuria.     9. Health maintenance. He received a flu shot in clinic 09/08/19. Has completed Shingrix series.     He is interested in receiving the COVID vaccine when available and I encouraged him to do so.     We discussed the importance of social distancing, wearing a mask outside the home, and hand washing/sanitizing. We discussed that this is particularly important for transplant patients who are at risk for complications from coronavirus.    10. Will see patient back in 4 months, or sooner if needed.      Scribe's Attestation: Lisbeth Ply, MD obtained and performed the history, physical exam and medical decision making elements that were entered into the chart.  Signed by Jerl Santos, Scribe, on January 05, 2020 at 9:15 AM.    ----------------------------------------------------------------------------------------------------------------------  January 06, 2020 9:08 PM. Documentation assistance provided  by the Scribe. I was present during the time the encounter was recorded. The information recorded by the Scribe was done at my direction and has been reviewed and validated by me.  ----------------------------------------------------------------------------------------------------------------------

## 2020-01-05 ENCOUNTER — Ambulatory Visit: Admit: 2020-01-05 | Discharge: 2020-01-05 | Payer: MEDICARE

## 2020-01-05 ENCOUNTER — Ambulatory Visit: Admit: 2020-01-05 | Discharge: 2020-01-05 | Payer: MEDICARE | Attending: Nephrology | Primary: Nephrology

## 2020-01-05 DIAGNOSIS — Z940 Kidney transplant status: Principal | ICD-10-CM

## 2020-01-05 DIAGNOSIS — Z79899 Other long term (current) drug therapy: Principal | ICD-10-CM

## 2020-01-05 LAB — CBC W/ AUTO DIFF
BASOPHILS RELATIVE PERCENT: 0.3 %
EOSINOPHILS ABSOLUTE COUNT: 0.1 10*9/L (ref 0.0–0.4)
EOSINOPHILS RELATIVE PERCENT: 2.1 %
HEMATOCRIT: 40.7 % — ABNORMAL LOW (ref 41.0–53.0)
HEMOGLOBIN: 12 g/dL — ABNORMAL LOW (ref 13.5–17.5)
LARGE UNSTAINED CELLS: 2 % (ref 0–4)
LYMPHOCYTES RELATIVE PERCENT: 17 %
MEAN CORPUSCULAR HEMOGLOBIN CONC: 29.6 g/dL — ABNORMAL LOW (ref 31.0–37.0)
MEAN CORPUSCULAR VOLUME: 92.4 fL (ref 80.0–100.0)
MEAN PLATELET VOLUME: 10.2 fL — ABNORMAL HIGH (ref 7.0–10.0)
MONOCYTES ABSOLUTE COUNT: 0.6 10*9/L (ref 0.2–0.8)
MONOCYTES RELATIVE PERCENT: 9.8 %
NEUTROPHILS ABSOLUTE COUNT: 4.3 10*9/L (ref 2.0–7.5)
NEUTROPHILS RELATIVE PERCENT: 68.6 %
PLATELET COUNT: 105 10*9/L — ABNORMAL LOW (ref 150–440)
RED BLOOD CELL COUNT: 4.4 10*12/L — ABNORMAL LOW (ref 4.50–5.90)
RED CELL DISTRIBUTION WIDTH: 14.5 % (ref 12.0–15.0)
WBC ADJUSTED: 6.3 10*9/L (ref 4.5–11.0)

## 2020-01-05 LAB — URINALYSIS
BACTERIA: NONE SEEN /HPF
BILIRUBIN UA: NEGATIVE
GLUCOSE UA: NEGATIVE
LEUKOCYTE ESTERASE UA: NEGATIVE
NITRITE UA: NEGATIVE
PH UA: 7 (ref 5.0–9.0)
PROTEIN UA: NEGATIVE
RBC UA: 3 /HPF (ref <=3)
SQUAMOUS EPITHELIAL: <1 /HPF (ref 0–5)
UROBILINOGEN UA: 0.2
WBC UA: <1 /HPF (ref <=2)

## 2020-01-05 LAB — BASIC METABOLIC PANEL
ANION GAP: 6 mmol/L — ABNORMAL LOW (ref 7–15)
BLOOD UREA NITROGEN: 60 mg/dL — ABNORMAL HIGH (ref 7–21)
CALCIUM: 9.8 mg/dL (ref 8.5–10.2)
CHLORIDE: 105 mmol/L (ref 98–107)
CO2: 28 mmol/L (ref 22.0–30.0)
CREATININE: 2.45 mg/dL — ABNORMAL HIGH (ref 0.70–1.30)
EGFR CKD-EPI AA MALE: 31 mL/min/{1.73_m2} — ABNORMAL LOW (ref >=60)
EGFR CKD-EPI NON-AA MALE: 26 mL/min/{1.73_m2} — ABNORMAL LOW (ref >=60)
GLUCOSE RANDOM: 99 mg/dL (ref 70–99)
POTASSIUM: 4.2 mmol/L (ref 3.5–5.0)
SODIUM: 139 mmol/L (ref 135–145)

## 2020-01-05 LAB — SMEAR REVIEW

## 2020-01-05 LAB — PHOSPHORUS: Phosphate:MCnc:Pt:Ser/Plas:Qn:: 3.5

## 2020-01-05 LAB — EGFR CKD-EPI AA MALE
Glomerular filtration rate/1.73 sq M.predicted.black:ArVRat:Pt:Ser/Plas/Bld:Qn:Creatinine-based formula (CKD-EPI): 31 — ABNORMAL LOW

## 2020-01-05 LAB — MAGNESIUM: Magnesium:MCnc:Pt:Ser/Plas:Qn:: 1.7

## 2020-01-05 LAB — PROTEIN / CREATININE RATIO, URINE: PROTEIN/CREAT RATIO, URINE: 0.579

## 2020-01-05 LAB — TACROLIMUS, TROUGH: Lab: 8.7

## 2020-01-05 LAB — BASOPHILS RELATIVE PERCENT: Basophils/100 leukocytes:NFr:Pt:Bld:Qn:Automated count: 0.3

## 2020-01-05 LAB — SLIDE REVIEW

## 2020-01-05 LAB — NITRITE UA: Nitrite:PrThr:Pt:Urine:Ord:Test strip: NEGATIVE

## 2020-01-05 LAB — PROTEIN URINE: Protein:MCnc:Pt:Urine:Qn:: 20.9

## 2020-01-05 MED FILL — PROGRAF 1 MG CAPSULE: 30 days supply | Qty: 150 | Fill #1

## 2020-01-05 MED FILL — PROGRAF 1 MG CAPSULE: 30 days supply | Qty: 150 | Fill #1 | Status: AC

## 2020-01-05 NOTE — Unmapped (Signed)
Transplant Coordinator, Clinic Visit   Pt seen today by transplant nephrology for follow up, reviewed medications and symptoms. PT STATES DOING WELL, NO COMPLAINTS.          01/05/20 0845   BP: 142/78   Pulse: 74   Temp: 36.6 ??C (97.9 ??F)   Weight: 73.9 kg (163 lb)   Height: 182.9 cm (6' 0.01)       Assessment  Headache: NO  Hand tremors: NO  Numbness/tingling: NO  Fevers: NO  Chills/sweats: NO  Shortness of breath: NO  Chest pain or pressure: NO  Palpitations: NO  Nausea/vomiting: NO  Diarrhea/constipation: NO  UTI symptoms: NO  Swelling: NO    Good appetite; reports adequate hydration.     Any new medications? NO  Immunosuppressant last taken: 10:30PM LAST NIGHT    I spent a total of 10 minutes with [name] reviewing medications and symptoms.

## 2020-01-06 LAB — T3 TOTAL: Triiodothyronine:MCnc:Pt:Ser/Plas:Qn:: 1.1

## 2020-01-06 LAB — FREE T4: Thyroxine.free:MCnc:Pt:Ser/Plas:Qn:: 3.28 — ABNORMAL HIGH

## 2020-01-06 LAB — THYROID STIMULATING HORMONE: Thyrotropin:ACnc:Pt:Ser/Plas:Qn:: 0.035 — ABNORMAL LOW

## 2020-01-07 LAB — CMV DNA, QUANTITATIVE, PCR: CMV VIRAL LD: NOT DETECTED

## 2020-01-07 LAB — CMV COMMENT: Lab: 0

## 2020-01-09 LAB — BK BLOOD RESULT: Lab: NOT DETECTED

## 2020-01-09 LAB — BK VIRUS QUANTITATIVE PCR, BLOOD: BK BLOOD RESULT: NOT DETECTED

## 2020-01-09 MED ORDER — MAGNESIUM OXIDE 400 MG (241.3 MG MAGNESIUM) TABLET
0 refills | 0 days | Status: CP
Start: 2020-01-09 — End: ?

## 2020-01-09 MED ORDER — ENALAPRIL MALEATE 5 MG TABLET
ORAL_TABLET | Freq: Two times a day (BID) | ORAL | 11 refills | 30 days | Status: CP
Start: 2020-01-09 — End: 2021-01-08

## 2020-01-09 NOTE — Unmapped (Signed)
Per Dr True patient to start enalapril 5mg  BID and then get labs in 1-2 weeks to check Cr and K+    Called and spoke to patient about restarting medication and he needs to make sure he gets his labs checked in the next week or so.    Patient in agreement to get labs and mentioned that he may have to get tooth pulled. He is waiting to hear back and will let me know if he needs [place] to prescribe antibiotic.    Denies any other needs

## 2020-01-26 MED ORDER — ENALAPRIL MALEATE 5 MG TABLET
ORAL_TABLET | 3 refills | 0 days | Status: CP
Start: 2020-01-26 — End: ?

## 2020-02-02 NOTE — Unmapped (Signed)
[facility] Refill Coordination Note    Specialty Medication(s) to be Shipped:   Transplant: Prograf 1mg     Other medication(s) to be shipped: N/A     Miguel Ward, DOB: [birth year]  Phone: [phone] (home)       All above HIPAA information was verified with patient.     Was a Nurse, learning disability used for this call? No    Completed refill call assessment today to schedule patient's medication shipment from the [facility] [phone]).       Specialty medication(s) and dose(s) confirmed: Regimen is correct and unchanged.   Changes to medications: Miguel Ward reports no changes at this time.  Changes to insurance: No  Questions for the pharmacist: No    Confirmed patient received Welcome Packet with first shipment. The patient will receive a drug information handout for each medication shipped and additional FDA Medication Guides as required.       DISEASE/MEDICATION-SPECIFIC INFORMATION        N/A    SPECIALTY MEDICATION ADHERENCE     Medication Adherence    Patient reported X missed doses in the last month: 0  Specialty Medication: Prograf 1mg   Patient is on additional specialty medications: No  Adherence tools used: patient uses a pill box to manage medications, calendar          Prograf 1 mg: 10 days of medicine on hand     SHIPPING     Shipping address confirmed in Epic.     Delivery Scheduled: Yes, Expected medication delivery date: 02/08/2020.     Medication will be delivered via UPS to the prescription address in Epic WAM.    Miguel Ward [facility] Specialty Technician

## 2020-02-06 DIAGNOSIS — Z940 Kidney transplant status: Principal | ICD-10-CM

## 2020-02-06 MED ORDER — CELLCEPT 250 MG CAPSULE
ORAL_CAPSULE | Freq: Two times a day (BID) | ORAL | 11 refills | 30 days | Status: CP
Start: 2020-02-06 — End: 2021-02-05

## 2020-02-06 NOTE — Unmapped (Signed)
Updated lab orders sent.

## 2020-02-07 ENCOUNTER — Other Ambulatory Visit
Admission: RE | Admit: 2020-02-07 | Discharge: 2020-02-07 | Disposition: A | Discharge status: Home / Self Care | Payer: Medicare HMO | Attending: Nephrology | Admitting: Nephrology

## 2020-02-07 ENCOUNTER — Other Ambulatory Visit: Payer: Self-pay

## 2020-02-07 ENCOUNTER — Encounter: Admit: 2020-02-07 | Discharge: 2020-02-07 | Payer: MEDICARE | Attending: Nephrology | Primary: Nephrology

## 2020-02-07 DIAGNOSIS — B259 Cytomegaloviral disease, unspecified: Secondary | ICD-10-CM | POA: Insufficient documentation

## 2020-02-07 DIAGNOSIS — Z09 Encounter for follow-up examination after completed treatment for conditions other than malignant neoplasm: Secondary | ICD-10-CM | POA: Diagnosis not present

## 2020-02-07 DIAGNOSIS — E559 Vitamin D deficiency, unspecified: Secondary | ICD-10-CM | POA: Diagnosis not present

## 2020-02-07 DIAGNOSIS — Z114 Encounter for screening for human immunodeficiency virus [HIV]: Secondary | ICD-10-CM | POA: Insufficient documentation

## 2020-02-07 DIAGNOSIS — Z79899 Other long term (current) drug therapy: Secondary | ICD-10-CM | POA: Diagnosis not present

## 2020-02-07 DIAGNOSIS — D899 Disorder involving the immune mechanism, unspecified: Secondary | ICD-10-CM | POA: Diagnosis present

## 2020-02-07 DIAGNOSIS — D631 Anemia in chronic kidney disease: Secondary | ICD-10-CM | POA: Insufficient documentation

## 2020-02-07 DIAGNOSIS — Z940 Kidney transplant status: Secondary | ICD-10-CM | POA: Diagnosis not present

## 2020-02-07 DIAGNOSIS — N189 Chronic kidney disease, unspecified: Secondary | ICD-10-CM | POA: Diagnosis not present

## 2020-02-07 DIAGNOSIS — N390 Urinary tract infection, site not specified: Secondary | ICD-10-CM | POA: Diagnosis not present

## 2020-02-07 DIAGNOSIS — E1129 Type 2 diabetes mellitus with other diabetic kidney complication: Secondary | ICD-10-CM | POA: Insufficient documentation

## 2020-02-07 DIAGNOSIS — Z789 Other specified health status: Secondary | ICD-10-CM | POA: Insufficient documentation

## 2020-02-07 DIAGNOSIS — Z9483 Pancreas transplant status: Secondary | ICD-10-CM | POA: Diagnosis not present

## 2020-02-07 LAB — URINALYSIS, COMPLETE (UACMP) WITH MICROSCOPIC
Bilirubin Urine: NEGATIVE
Glucose, UA: NEGATIVE mg/dL
Ketones, ur: NEGATIVE mg/dL
Leukocytes,Ua: NEGATIVE
Nitrite: NEGATIVE
Protein, ur: NEGATIVE mg/dL
Specific Gravity, Urine: 1.01 (ref 1.005–1.030)
pH: 5 (ref 5.0–8.0)

## 2020-02-07 LAB — CBC WITH DIFFERENTIAL/PLATELET
Abs Immature Granulocytes: 0.02 10*3/uL (ref 0.00–0.07)
Basophils Absolute: 0 10*3/uL (ref 0.0–0.1)
Basophils Relative: 0 %
Eosinophils Absolute: 0.1 10*3/uL (ref 0.0–0.5)
Eosinophils Relative: 2 %
HCT: 40 % (ref 39.0–52.0)
Hemoglobin: 12 g/dL — ABNORMAL LOW (ref 13.0–17.0)
Immature Granulocytes: 0 %
Lymphocytes Relative: 22 %
Lymphs Abs: 1.3 10*3/uL (ref 0.7–4.0)
MCH: 27.3 pg (ref 26.0–34.0)
MCHC: 30 g/dL (ref 30.0–36.0)
MCV: 90.9 fL (ref 80.0–100.0)
Monocytes Absolute: 0.6 10*3/uL (ref 0.1–1.0)
Monocytes Relative: 10 %
Neutro Abs: 3.9 10*3/uL (ref 1.7–7.7)
Neutrophils Relative %: 66 %
Platelets: 121 10*3/uL — ABNORMAL LOW (ref 150–400)
RBC: 4.4 MIL/uL (ref 4.22–5.81)
RDW: 13.5 % (ref 11.5–15.5)
WBC: 5.8 10*3/uL (ref 4.0–10.5)
nRBC: 0 % (ref 0.0–0.2)

## 2020-02-07 LAB — BASIC METABOLIC PANEL
Anion gap: 10 (ref 5–15)
BUN: 72 mg/dL — ABNORMAL HIGH (ref 8–23)
CO2: 26 mmol/L (ref 22–32)
Calcium: 9.4 mg/dL (ref 8.9–10.3)
Chloride: 102 mmol/L (ref 98–111)
Creatinine, Ser: 2.61 mg/dL — ABNORMAL HIGH (ref 0.61–1.24)
GFR calc Af Amer: 28 mL/min — ABNORMAL LOW (ref >60)
GFR calc non Af Amer: 24 mL/min — ABNORMAL LOW (ref >60)
Glucose, Bld: 137 mg/dL — ABNORMAL HIGH (ref 70–99)
Potassium: 4.3 mmol/L (ref 3.5–5.1)
Sodium: 138 mmol/L (ref 135–145)

## 2020-02-07 LAB — PHOSPHORUS: Phosphorus: 3 mg/dL (ref 2.5–4.6)

## 2020-02-07 LAB — MAGNESIUM: Magnesium: 2 mg/dL (ref 1.7–2.4)

## 2020-02-07 MED FILL — PROGRAF 1 MG CAPSULE: 30 days supply | Qty: 150 | Fill #2 | Status: AC

## 2020-02-07 MED FILL — PROGRAF 1 MG CAPSULE: 30 days supply | Qty: 150 | Fill #2

## 2020-02-08 DIAGNOSIS — Z940 Kidney transplant status: Principal | ICD-10-CM

## 2020-02-08 DIAGNOSIS — Z79899 Other long term (current) drug therapy: Principal | ICD-10-CM

## 2020-02-08 LAB — URINE CULTURE: Culture: NO GROWTH

## 2020-02-08 LAB — TACROLIMUS, TROUGH: Lab: 5.5

## 2020-02-09 DIAGNOSIS — Z940 Kidney transplant status: Principal | ICD-10-CM

## 2020-02-09 MED ORDER — CELLCEPT 250 MG CAPSULE
ORAL_CAPSULE | Freq: Two times a day (BID) | ORAL | 11 refills | 30 days | Status: CP
Start: 2020-02-09 — End: 2021-02-08

## 2020-02-12 DIAGNOSIS — Z940 Kidney transplant status: Principal | ICD-10-CM

## 2020-02-12 MED ORDER — CELLCEPT 250 MG CAPSULE
ORAL_CAPSULE | Freq: Two times a day (BID) | ORAL | 11 refills | 30.00000 days | Status: CP
Start: 2020-02-12 — End: 2021-02-11

## 2020-02-12 NOTE — Unmapped (Signed)
Patient called and said that [facility] did not have the cellcept prescription. Friday they confirmed that they had prescription so I let pt know I will call them    Per Renea Ee at [facility] they would like a hard copy of prescription faxed to [phone]    Prescription faxed in. Called [phone] to confirm with [facility] and updated patient

## 2020-02-26 NOTE — Unmapped (Signed)
[facility] Clinical Assessment & Refill Coordination Note    Miguel Ward, DOB: 12-06-1951  Phone: [phone] (home)     All above HIPAA information was verified with patient.     Was a Nurse, learning disability used for this call? No    Specialty Medication(s):   Transplant: Prograf 1mg      Current Outpatient Medications   Medication Sig Dispense Refill   ??? acyclovir (ZOVIRAX) 400 MG tablet TAKE (1) TABLET BY MOUTH EVERY DAY 90 tablet 3   ??? allopurinoL (ZYLOPRIM) 100 MG tablet TAKE 1 AND 1/2 TABLETS BY MOUTH ONCE DAILY 135 each 0   ??? aspirin (ASPIRIN LOW-STRENGTH) 81 MG chewable tablet Chew 81 mg daily.     ??? atorvastatin (LIPITOR) 20 MG tablet Take 1 tablet (20 mg total) by mouth nightly. 90 each 3   ??? calcitrioL (ROCALTROL) 0.25 MCG capsule TAKE 1 CAPSULE BY MOUTH EVERY MONDAY, WEDNESDAY AND FRIDAY 36 each 0   ??? CELLCEPT 250 mg capsule Take 1 capsule (250 mg total) by mouth Two (2) times a day. 60 capsule 11   ??? colchicine (COLCRYS) 0.6 mg tablet Take 1 tablet (0.6 mg total) by mouth daily as needed. for up to 1 dose (Patient not taking: Reported on 11/17/2018) 30 tablet 11   ??? enalapril (VASOTEC) 5 MG tablet TAKE (1) TABLET BY MOUTH TWICE DAILY 180 tablet 3   ??? ferrous sulfate 325 (65 FE) MG tablet Take 1 tablet (325 mg total) by mouth daily.  0   ??? fluticasone (FLONASE) 50 mcg/actuation nasal spray 1 spray into each nostril daily as needed.     ??? furosemide (LASIX) 20 MG tablet Take by mouth Two (2) times a day. Take 40 mg in the morning and 20 mg in the evening.     ??? levothyroxine (SYNTHROID) 200 MCG tablet TAKE ONE TABLET EVERY DAY WITH (2) FOR A TOTAL DAILY DOSE OF 90 tablet 3   ??? levothyroxine (SYNTHROID) 200 MCG tablet Take 1 tab with two tab, total dose daily 90 tablet 3   ??? levothyroxine (SYNTHROID) 25 MCG tablet Take 50 mcg by mouth daily. Take with 200 mcg tablet for TOTAL daily dose of 250 mcg.     ??? magnesium oxide (MAG-OX) 400 mg (241.3 mg magnesium) tablet TAKE (1) TABLET BY MOUTH TWICE DAILY 180 each 0   ??? metoprolol succinate (TOPROL-XL) 100 MG 24 hr tablet TAKE (1) TABLET BY MOUTH EVERY DAY 90 each 3   ??? omeprazole (PRILOSEC) 20 MG capsule TAKE 1 CAPSULE BY MOUTH EVERY DAY 90 capsule 3   ??? predniSONE (DELTASONE) 5 MG tablet Take 1 tablet (5 mg total) by mouth daily. 90 each 3   ??? PROGRAF 1 mg capsule TAKE 3 CAPSULES (3MG ) BY MOUTH IN THE MORNING AND 2 CAPSULES (2MG ) BY MOUTH IN THE EVENING. 180 capsule 11   ??? simethicone (GAS-X) 80 MG chewable tablet Chew 1 tablet (80 mg total) every six (6) hours as needed for flatulence. 30 tablet 1     No current facility-administered medications for this visit.         Changes to medications: Miguel Ward reports no changes at this time.    Allergies   Allergen Reactions   ??? Penicillins Hives     Other reaction(s): HIVES       Changes to allergies: No    SPECIALTY MEDICATION ADHERENCE     Prograf 1 mg: 13 days of medicine on hand  Medication Adherence    Patient reported X missed doses in the last month: 0  Specialty Medication: Prograf 1mg   Patient is on additional specialty medications: No  Adherence tools used: patient uses a pill box to manage medications, calendar          Specialty medication(s) dose(s) confirmed: Regimen is correct and unchanged.     Are there any concerns with adherence? No    Adherence counseling provided? Not needed    CLINICAL MANAGEMENT AND INTERVENTION      Clinical Benefit Assessment:    Do you feel the medicine is effective or helping your condition? Yes    Clinical Benefit counseling provided? Not needed    Adverse Effects Assessment:    Are you experiencing any side effects? No    Are you experiencing difficulty administering your medicine? No    Quality of Life Assessment:    How many days over the past month did your kidney transplant  keep you from your normal activities? For example, brushing your teeth or getting up in the morning. 0    Have you discussed this with your provider? Not needed    Therapy Appropriateness:    Is therapy appropriate? Yes, therapy is appropriate and should be continued    DISEASE/MEDICATION-SPECIFIC INFORMATION      N/A    PATIENT SPECIFIC NEEDS     ? Does the patient have any physical, cognitive, or cultural barriers? No    ? Is the patient high risk? Yes, patient is taking a REMS drug. Medication is dispensed in compliance with REMS program. Cellcept from [employer]    ? Does the patient require a Care Management Plan? No     ? Does the patient require physician intervention or other additional services (i.e. nutrition, smoking cessation, social work)? No      SHIPPING     Specialty Medication(s) to be Shipped:   Transplant: Prograf 1mg     Other medication(s) to be shipped: none     Changes to insurance: No    Delivery Scheduled: Yes, Expected medication delivery date: 03/06/20.     Medication will be delivered via UPS to the confirmed prescription address in [facility].    The patient will receive a drug information handout for each medication shipped and additional FDA Medication Guides as required.  Verified that patient has previously received a Conservation officer, historic buildings.    All of the patient's questions and concerns have been addressed.    [name]   [facility] Specialty Pharmacist

## 2020-03-05 MED FILL — PROGRAF 1 MG CAPSULE: 30 days supply | Qty: 150 | Fill #3 | Status: AC

## 2020-03-05 MED FILL — PROGRAF 1 MG CAPSULE: 30 days supply | Qty: 150 | Fill #3

## 2020-03-08 MED ORDER — LEVOTHYROXINE 25 MCG TABLET
ORAL_TABLET | 0 refills | 0 days | Status: CP
Start: 2020-03-08 — End: ?

## 2020-03-08 NOTE — Unmapped (Signed)
Patient has requested a medication refill via EPIC

## 2020-03-13 MED ORDER — COLCHICINE 0.6 MG TABLET
ORAL_TABLET | Freq: Every day | ORAL | 1 refills | 30 days | Status: CP | PRN
Start: 2020-03-13 — End: ?

## 2020-03-13 NOTE — Unmapped (Signed)
Patient called and reports a gout flare to his thumb for the past week. Has been taking tylenol with no relief    Per Darolyn Rua patient to take colchicine 0.6mg  daily as needed for gout    Called and updated patient.  He verbalized understanding and will call me Monday if he is not feeling better    Denies any other needs at this time

## 2020-03-18 NOTE — Unmapped (Signed)
Pt request for RX Refill

## 2020-03-26 NOTE — Unmapped (Signed)
[facility] Refill Coordination Note    Specialty Medication(s) to be Shipped:   Transplant: Prograf 1mg     Other medication(s) to be shipped: N/A     Miguel Ward, DOB: May 01, 1951  Phone: [phone] (home)       All above HIPAA information was verified with patient.     Was a Nurse, learning disability used for this call? No    Completed refill call assessment today to schedule patient's medication shipment from the [facility] [phone]).       Specialty medication(s) and dose(s) confirmed: Regimen is correct and unchanged.   Changes to medications: Martavis reports no changes at this time.  Changes to insurance: No  Questions for the pharmacist: No    Confirmed patient received Welcome Packet with first shipment. The patient will receive a drug information handout for each medication shipped and additional FDA Medication Guides as required.       DISEASE/MEDICATION-SPECIFIC INFORMATION        N/A    SPECIALTY MEDICATION ADHERENCE     Medication Adherence    Patient reported X missed doses in the last month: 0  Specialty Medication: Prograf 1mg   Patient is on additional specialty medications: No  Adherence tools used: patient uses a pill box to manage medications, calendar          Prograf 1 mg: 14 days of medicine on hand     SHIPPING     Shipping address confirmed in Epic.     Delivery Scheduled: Yes, Expected medication delivery date: 04/05/2020.     Medication will be delivered via UPS to the prescription address in Epic [facility].    Lorelei Pont [facility] Specialty Technician

## 2020-04-04 MED FILL — PROGRAF 1 MG CAPSULE: 30 days supply | Qty: 150 | Fill #4 | Status: AC

## 2020-04-04 MED FILL — PROGRAF 1 MG CAPSULE: 30 days supply | Qty: 150 | Fill #4

## 2020-04-15 MED ORDER — CALCITRIOL 0.25 MCG CAPSULE
0 refills | 0 days
Start: 2020-04-15 — End: ?

## 2020-04-17 MED ORDER — CALCITRIOL 0.25 MCG CAPSULE
ORAL_CAPSULE | ORAL | 3 refills | 84.00000 days | Status: CP
Start: 2020-04-17 — End: ?

## 2020-04-30 MED ORDER — FUROSEMIDE 40 MG TABLET
0 days
Start: 2020-04-30 — End: ?

## 2020-05-01 DIAGNOSIS — D849 Immunodeficiency, unspecified: Principal | ICD-10-CM

## 2020-05-01 DIAGNOSIS — N186 End stage renal disease: Principal | ICD-10-CM

## 2020-05-01 DIAGNOSIS — B998 Other infectious disease: Principal | ICD-10-CM

## 2020-05-01 DIAGNOSIS — Z940 Kidney transplant status: Principal | ICD-10-CM

## 2020-05-02 ENCOUNTER — Ambulatory Visit: Admit: 2020-05-02 | Discharge: 2020-05-03 | Payer: MEDICARE

## 2020-05-02 ENCOUNTER — Ambulatory Visit: Admit: 2020-05-02 | Discharge: 2020-05-03 | Payer: MEDICARE | Attending: Nephrology | Primary: Nephrology

## 2020-05-02 DIAGNOSIS — D849 Immunodeficiency, unspecified: Secondary | ICD-10-CM

## 2020-05-02 DIAGNOSIS — N186 End stage renal disease: Principal | ICD-10-CM

## 2020-05-02 DIAGNOSIS — B998 Other infectious disease: Secondary | ICD-10-CM

## 2020-05-02 DIAGNOSIS — Z940 Kidney transplant status: Principal | ICD-10-CM

## 2020-05-02 LAB — BASIC METABOLIC PANEL
ANION GAP: 7 mmol/L (ref 3–11)
BLOOD UREA NITROGEN: 63 mg/dL — ABNORMAL HIGH (ref 9–23)
BUN / CREAT RATIO: 24
CALCIUM: 10.3 mg/dL (ref 8.7–10.4)
CO2: 24.9 mmol/L (ref 20.0–31.0)
CREATININE: 2.64 mg/dL — ABNORMAL HIGH (ref 0.60–1.10)
EGFR CKD-EPI AA MALE: 28 mL/min/{1.73_m2}
EGFR CKD-EPI NON-AA MALE: 24 mL/min/{1.73_m2}
GLUCOSE RANDOM: 95 mg/dL (ref 70–179)
POTASSIUM: 4.4 mmol/L (ref 3.5–5.1)

## 2020-05-02 LAB — CBC W/ AUTO DIFF
BASOPHILS ABSOLUTE COUNT: 0.1 10*9/L (ref 0.0–0.1)
BASOPHILS RELATIVE PERCENT: 0.9 %
EOSINOPHILS RELATIVE PERCENT: 2.4 %
HEMATOCRIT: 38.6 % (ref 38.0–50.0)
HEMOGLOBIN: 11.9 g/dL — ABNORMAL LOW (ref 13.5–17.5)
LYMPHOCYTES ABSOLUTE COUNT: 1.1 10*9/L (ref 0.7–4.0)
LYMPHOCYTES RELATIVE PERCENT: 19.2 %
MEAN CORPUSCULAR HEMOGLOBIN CONC: 30.8 g/dL (ref 30.0–36.0)
MEAN CORPUSCULAR VOLUME: 87.8 fL (ref 81.0–95.0)
MEAN PLATELET VOLUME: 10.2 fL — ABNORMAL HIGH (ref 7.0–10.0)
MONOCYTES RELATIVE PERCENT: 8.6 %
NEUTROPHILS ABSOLUTE COUNT: 4 10*9/L (ref 1.7–7.7)
NEUTROPHILS RELATIVE PERCENT: 68.9 %
PLATELET COUNT: 112 10*9/L — ABNORMAL LOW (ref 150–450)
RED BLOOD CELL COUNT: 4.4 10*12/L (ref 4.32–5.72)
RED CELL DISTRIBUTION WIDTH: 14.3 % (ref 12.0–15.0)
WBC ADJUSTED: 5.8 10*9/L (ref 3.5–10.5)

## 2020-05-02 LAB — PARATHYROID HOMONE (PTH): PARATHYROID HORMONE INTACT: 346.8 pg/mL — ABNORMAL HIGH (ref 12.0–72.0)

## 2020-05-02 LAB — SQUAMOUS EPITHELIAL: Lab: 1

## 2020-05-02 LAB — PROTEIN / CREATININE RATIO, URINE: CREATININE, URINE: 78.8 mg/dL

## 2020-05-02 LAB — URINALYSIS
BILIRUBIN UA: NEGATIVE
BLOOD UA: NEGATIVE
GLUCOSE UA: NEGATIVE
HYALINE CASTS: 3 /LPF — ABNORMAL HIGH (ref 0–1)
KETONES UA: NEGATIVE
LEUKOCYTE ESTERASE UA: NEGATIVE
NITRITE UA: NEGATIVE
PH UA: 5.5 (ref 5.0–9.0)
PROTEIN UA: NEGATIVE
RBC UA: 2 /HPF (ref <3)
SPECIFIC GRAVITY UA: 1.015 (ref 1.005–1.030)
SQUAMOUS EPITHELIAL: 1 /HPF (ref 0–5)
TRANSITIONAL EPITHELIAL: <1 /HPF (ref 0–2)
UROBILINOGEN UA: 0.2

## 2020-05-02 LAB — MAGNESIUM
MAGNESIUM: 1.9 mg/dL (ref 1.6–2.6)
Magnesium:MCnc:Pt:Ser/Plas:Qn:: 1.9

## 2020-05-02 LAB — CO2: Carbon dioxide:SCnc:Pt:Ser/Plas:Qn:: 24.9

## 2020-05-02 LAB — CALCIUM: Calcium:MCnc:Pt:Ser/Plas:Qn:: 9.9

## 2020-05-02 LAB — MEAN CORPUSCULAR HEMOGLOBIN: Erythrocyte mean corpuscular hemoglobin:EntMass:Pt:RBC:Qn:Automated count: 27

## 2020-05-02 LAB — CREATININE, URINE: Lab: 78.8

## 2020-05-02 LAB — PHOSPHORUS: Phosphate:MCnc:Pt:Ser/Plas:Qn:: 3

## 2020-05-02 LAB — TACROLIMUS, TROUGH: Lab: 6.1

## 2020-05-02 NOTE — Unmapped (Signed)
AOBP performed left arm, small cuff.  1st reading - 183/92, p 65  2nd reading - 184/89, p 68  3rd reading - error  Average - 184/91, p 67

## 2020-05-02 NOTE — Unmapped (Addendum)
Transplant Nephrology Clinic Visit      History of Present Illness    Patient is a 69 y.o. male who underwent deceased donor transplant on 05-Jul-2007 secondary to lupus nephritis. His post transplant course has been complicated by early cellular rejection treated with Thymoglobulin. At that time, he was enrolled in the JAK-3 inhibitor trial and was taken off study drug. Additionally, he was noted to have a large pericardial effusion of greater than 1 L in 08/2007. At that time his TSH was 258. This improved with pericardiocentesis and thyroid replacement and has not reaccumulated. In 2009, he had coronary artery disease which required stenting. Patient does not have evidence of donor specific antibodies. His creatinine was between 1.5 and 2.2, but has been more in the low to mid 2s since his right native nephrectomy on 02/20/14 for renal cell carcinoma.  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 No evidence of DSA antibodies as of 09/08/19. Most recent baseline creatinine while hospitalized was 2.6-2.8, had been around 2.2-2.6 immediately prior to that.    He was admitted to [facility] from 9/15 through 08/31/2016 after presenting with fever and leukocytosis. His initial urine culture specimen was lost, but he was treated for presumptive UTI. He improved clinically on antibiotics. During that evaluation he had a CT of the abdomen on 08/29/2016 that showed New indeterminate liver lesions concerning for metastatic disease versus post transplant lymphoproliferative disorder versus abscesses. He then underwent MRI on 9/18 which showed Two liver lesions with minimal enhancement. Differential includes post transplant lymphoproliferative disorder, sequelae of prior infection or hematoma, less likely acute abscess. Metastases unlikely given enhancement characteristics. EBV was negative. As an outpatient he then underwent a PET scan on 09/14/2016 which showed Enlarging right hepatic lobe lesion with intense FDG uptake and central area of photopenia, concerning for necrotic metastasis, less likely abscess. Stable right hepatic dome lesion with intense FDG uptake, concerning for additional metastasis. After consult with radiology, they felt the best approach was ultrasound guided biopsy which he had on 10/01/2016. Pathology revealed Fragments of liver tissue with parenchymal extinction, mixed inflammatory infiltrates, and reactive-appearing stromal changes (see comment), - Increased iron staining within adjacent intact hepatic parenchyma (grade 3 of 4), - Sinusoidal congestion. The differential diagnosis includes sequelae from an abscess (e.g., inflammatory pseudotumor) versus nonspecific inflammatory changes adjacent to an unsampled mass. Correlation with the radiologic findings and clinical follow up are suggested.    The patient had a repeat MRI on 02/01/17 to visualize lesions seen on MRI in September. His repeat MRI revealed interval moderate decrease in the size of previously detected liver lesions, which could represent the sequelae of resolving right hepatic lobe infection with interval decrease in size of large right hepatic lobe lesion with internal T1 hyperintensity likely reflecting proteinacious material and mild internal and perilesional enhancement. Interval decrease in size and conspicuity of small hepatic dome lesion is also noted and this could also represent a resolving abscess. They recommended a re-scan in approximately 3 months (04/2017). I reviewed the report and images with Dr. Rush Barer who agreed that the lesion continues to look like resolving infection.     Patient underwent renal biopsy on 12/30/2017 to evaluate proteinuria which had increased over the last year from 0.75 to 1.4. 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 no proliferative glomerular lesions. These changes can indicate a minor recurrence of the lupus nephritis not carrying any direct therapeutic and prognostic significance at the present time.); Tubular pigment deposits, consistent with lipofuscin.    He was admitted 3/1 through 02/14/18 with RSV infection, AKI and new diastolic and systolic heart failure. TTE showed moderate EF 40-45%, reduced from 52% reported in January 2015. Patient's home lasix was increased to 40 mg qd from 20 mg qd with improvement in symptoms. Creatinine peaked at 2.73, 2.69 on discharge. It was suspected that patient's RSV infection worsened his heart failure and caused AKI is due to reduced perfusion. Patient's enalapril was held on discharge.     He was admitted 11/4 through 10/21/18 after presenting with dyspnea secondary to acute on chronic systolic heart failure. Echo showed globally, mildly decreased EF of 45% and grade III diastolic dysfunction. Patient was diuresed with lasix and discharged home on 80 mg bid. He was also found to have 12 L/min flow on PVL fistula study and underwent fistula banding to prevent worsening cardiac function from high output heart failure. Creatinine was elevated at time of admission to 3 but downtrended with diuresis to 2.3-2.8. Tac dose was decreased to 3 mg qAM and 2 mg qPM. No other changes to immunosuppression. Enalapril held due to kidney dysfunction.     At surgery visit on 11/17/18 to discuss fistula ligation (as flow post banding remained at 10 L/min), EKG was ordered and patient noted to be in a-fib. Was able to see cardiology that afternoon. No episodes of a-fib while monitored in the hospital and no prior history. Patient asymptomatic and hemodynamically stable. Rate controlled on metoprolol 100 mg daily, not started on anticoagulation at that time.      He underwent banding of right upper AV fistula on 02/07/19. Tolerated procedure well. He was seen in the emergency department on 02/11/19, presenting with increased swelling and intermittent bleeding from AV fistula incision site. Vascular surgery and Dr. Norma Fredrickson were consulted who both agreed patient was stable for discharge home, so long as there was no hematoma or signs of infection.     Interval history since last visit 01/05/20    Patient has been doing well since last visit. No hospitalizations over that period. Labs on 02/07/20, several weeks after starting enalapril, showed a stable cr of 2.6, potassium of 4.3. Had a gout flare that last week of March in his thumb. Flew for the first time in his life last week to [place] to visit his brother who recently had a stroke but is improving. 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 Received COVID vaccines in February, had a sore arm but otherwise no side effects/reaction.     He specifically denies fever, myalgias, upper respiratory/GI symptoms, lumps or bumps, change in urinary volume, frequency, dysuria, change in color or foamy urine.       Review of Systems    Otherwise on review of systems patient denies fever or chills, chest pain, SOB, PND, orthopnea or edema. No N/V/abdominal pain. No current diarrhea. No dysuria, hematuria or difficulty voiding. All other systems are reviewed and are negative.    Medications    Current Outpatient Medications   Medication Sig Dispense Refill   ??? acyclovir (ZOVIRAX) 400 MG tablet TAKE (1) TABLET BY MOUTH EVERY DAY 90 tablet 3   ??? allopurinoL (ZYLOPRIM) 100 MG tablet TAKE 1 AND 1/2 TABLETS BY  MOUTH ONCE DAILY 135 each 0   ??? aspirin (ASPIRIN LOW-STRENGTH) 81 MG chewable tablet Chew 81 mg daily.     ??? atorvastatin (LIPITOR) 20 MG tablet Take 1 tablet (20 mg total) by mouth nightly. 90 each 3   ??? calcitrioL (ROCALTROL) 0.25 MCG capsule Take 1 capsule (0.25 mcg total) by mouth every Monday, Wednesday, and Friday at 1600. 36 capsule 3   ??? CELLCEPT 250 mg capsule Take 1 capsule (250 mg total) by mouth Two (2) times a day. 60 capsule 11   ??? colchicine (COLCRYS) 0.6 mg tablet Take 1 tablet (0.6 mg total) by mouth daily as needed for up to 1 dose. 30 tablet 1   ??? enalapril (VASOTEC) 5 MG tablet TAKE (1) TABLET BY MOUTH TWICE DAILY 180 tablet 3   ??? ferrous sulfate 325 (65 FE) MG tablet Take 1 tablet (325 mg total) by mouth daily.  0   ??? fluticasone (FLONASE) 50 mcg/actuation nasal spray 1 spray into each nostril daily as needed.     ??? furosemide (LASIX) 20 MG tablet Take by mouth Two (2) times a day. Take 40 mg in the morning and 20 mg in the evening.     ??? levothyroxine (SYNTHROID) 200 MCG tablet TAKE ONE TABLET EVERY DAY WITH (2) FOR A TOTAL DAILY DOSE OF 90 tablet 3   ??? levothyroxine (SYNTHROID) 200 MCG tablet Take 1 tab with two tab, total dose daily 90 tablet 3   ??? levothyroxine (SYNTHROID) 25 MCG tablet TAKE (2) TABLETS BY MOUTH EVERY DAY WITH1 TAB FOR A TOTAL DAILY DOSE OF 180 tablet 0   ??? magnesium oxide (MAG-OX) 400 mg (241.3 mg magnesium) tablet TAKE (1) TABLET BY MOUTH TWICE DAILY 180 each 0   ??? metoprolol succinate (TOPROL-XL) 100 MG 24 hr tablet TAKE (1) TABLET BY MOUTH EVERY DAY 90 each 3   ??? omeprazole (PRILOSEC) 20 MG capsule TAKE 1 CAPSULE BY MOUTH EVERY DAY 90 capsule 3   ??? predniSONE (DELTASONE) 5 MG tablet Take 1 tablet (5 mg total) by mouth daily. 90 each 3   ??? PROGRAF 1 mg capsule TAKE 3 CAPSULES (3MG ) BY MOUTH IN THE MORNING AND 2 CAPSULES (2MG ) BY MOUTH IN THE EVENING. 180 capsule 11   ??? simethicone (GAS-X) 80 MG chewable tablet Chew 1 tablet (80 mg total) every six (6) hours as needed for flatulence. 30 tablet 1     No current facility-administered medications for this visit.       Physical Exam    BP 184/91 (BP Site: L Arm, BP Position: Sitting, BP Cuff Size: Small)  - Pulse 67  - Temp 36.1 ??C (96.9 ??F) (Temporal)  - Resp 14  - Wt 74.2 kg (163 lb 9.6 oz)  - BMI 22.18 kg/m??   General: Patient is a pleasant male in no apparent distress.  Eyes: Sclera anicteric. No discharge   ENT: Oropharynx without lesions. MMM  Neck: Supple without LAD/JVD/bruits. No lymphadenopathy appreciated   Lungs: Clear to auscultation bilaterally, no wheezes/rales/rhonchi.  Cardiovascular: Regular rate and rhythm without murmurs, rubs or gallops.  Abdomen: Soft, notender/nondistended. Positive bowel sounds. No tenderness over the graft.  Extremities: Without edema, joints without evidence of synovitis.  Skin: Without rash.  Neurological: Grossly nonfocal.  Psychiatric: Mood and affect appropriate.      Laboratory Results    Recent Results (from the past 170 hour(s))   Magnesium Level    Collection Time: 05/02/20  8:47 AM  Result Value Ref Range    Magnesium 1.9 1.6 - 2.6 mg/dL   Phosphorus Level    Collection Time: 05/02/20  8:47 AM   Result Value Ref Range    Phosphorus 3.0 2.4 - 5.1 mg/dL   Tacrolimus, Trough   [facility])    Collection Time: 05/02/20  8:47 AM   Result Value Ref Range    Tacrolimus, Trough 6.1 5.0 - 15.0 ng/mL   Basic Metabolic Panel    Collection Time: 05/02/20  8:47 AM   Result Value Ref Range    Sodium 138 135 - 145 mmol/L    Potassium 4.4 3.5 - 5.1 mmol/L    Chloride 106 98 - 107 mmol/L    CO2 24.9 20.0 - 31.0 mmol/L    Anion Gap 7 3 - 11 mmol/L    BUN 63 (H) 9 - 23 mg/dL    Creatinine 1.61 (H) 0.60 - 1.10 mg/dL    BUN/Creatinine Ratio 24     EGFR CKD-EPI Non-African American, Male 24 mL/min/1.59m2    EGFR CKD-EPI African American, Male 28 mL/min/1.54m2    Glucose 95 70 - 179 mg/dL    Calcium 09.6 8.7 - 04.5 mg/dL   Parathyroid Hormone (PTH)    Collection Time: 05/02/20  8:47 AM   Result Value Ref Range    PTH 346.8 (H) 12.0 - 72.0 pg/mL    Calcium 9.9 8.5 - 10.2 mg/dL   CMV DNA, quantitative, PCR    Collection Time: 05/02/20  8:47 AM   Result Value Ref Range    CMV Viral Ld Not Detected Not Detected    CMV Quant CMV Quant Log10      CMV Comment     CBC w/ Differential    Collection Time: 05/02/20  8:47 AM   Result Value Ref Range    WBC 5.8 3.5 - 10.5 10*9/L    RBC 4.40 4.32 - 5.72 10*12/L    HGB 11.9 (L) 13.5 - 17.5 g/dL    HCT 40.9 81.1 - 91.4 %    MCV 87.8 81.0 - 95.0 fL    MCH 27.0 26.0 - 34.0 pg    MCHC 30.8 30.0 - 36.0 g/dL    RDW 78.2 95.6 - 21.3 %    MPV 10.2 (H) 7.0 - 10.0 fL    Platelet 112 (L) 150 - 450 10*9/L    Neutrophils % 68.9 %    Lymphocytes % 19.2 %    Monocytes % 8.6 %    Eosinophils % 2.4 %    Basophils % 0.9 %    Absolute Neutrophils 4.0 1.7 - 7.7 10*9/L    Absolute Lymphocytes 1.1 0.7 - 4.0 10*9/L    Absolute Monocytes 0.5 0.1 - 1.0 10*9/L    Absolute Eosinophils 0.1 0.0 - 0.7 10*9/L    Absolute Basophils 0.1 0.0 - 0.1 10*9/L   Urinalysis    Collection Time: 05/02/20  8:54 AM   Result Value Ref Range    Color, UA Yellow     Clarity, UA Clear     Specific Gravity, UA 1.015 1.005 - 1.030    pH, UA 5.5 5.0 - 9.0    Leukocyte Esterase, UA Negative Negative    Nitrite, UA Negative Negative    Protein, UA Negative Negative    Glucose, UA Negative Negative    Ketones, UA Negative Negative    Urobilinogen, UA 0.2 mg/dL 0.2 - 2.0 mg/dL    Bilirubin, UA Negative Negative    Blood, UA Negative  Negative    RBC, UA 2 <3 /HPF    WBC, UA 1 <2 /HPF    Squam Epithel, UA 1 0 - 5 /HPF    Bacteria, UA Rare (A) None Seen /HPF    Trans Epithel, UA <1 0 - 2 /HPF    Hyaline Casts, UA 3 (H) 0 - 1 /LPF   Culture, Urine    Collection Time: 05/02/20  8:54 AM    Specimen: Clean Catch; Urine   Result Value Ref Range    Urine Culture, Comprehensive NO GROWTH    Protein/Creatinine Ratio, Urine    Collection Time: 05/02/20  8:54 AM   Result Value Ref Range    Creat U 78.8 mg/dL    Protein, Ur 16.1 mg/dL    Protein/Creatinine Ratio, Urine 0.137        Assessment and Plan    1. Status post renal transplant. Creatinine today is within his most recent baseline at 2.64. His Prograf trough of 6.1 is a 10 hour trough (last dose at 11 PM) and below goal of 6-8. Will increase immunosuppression from 3mg /2mg  to 3 mg/3mg . He remains on a reduced dose of CellCept 250mg  BID with history of diarrhea and malignancy.    2. Fluid overload and HFrEF. He underwent  fistula banding in February 2020 and fluid retention/dyspnea have resolved since that time. He is euvolemic today on current Lasix dose. Tolerating enalapril well (no siginificant bump in cr or K)    3. Right renal cell carcinoma status post nephrectomy. Imaging previously showed hepatic mass that was biopsied and consistent with inflammatory process. MRI from 09/27/19 remains reassuring, with unchanged size and overall appearance of hepatic lesion. Surgery did not recommend any additional diagnostic workup and will continue to monitor annually with imaging.    4. Hypertension. Blood pressure is 184/91 today. Currently on enalapril 5 mg BID, metoprolol XL 100 mg. Took enalapril but not metoprolol earlier today and endorses some nervousness while in the office. Mr. Didonato agreed to purchase BP cuff, will contact cooridinator if BP's are running above goal of 130/80.     5. Atrial fibrillation. Rate controlled on current dose of metoprolol. He is not currently on anticoagulation given history of thrombocytopenia. Cardiology is managing.      6. Hypothyroidism. On Synthroid at 250 mcg daily.     7. Hypomagnesemia. Magnesium normal today at 1.9 on 400 mg BID.     8. Proteinuria.  UP/C peaked at 1.5 on 05/20/2018 -> 0.355 on 09/08/19 -> 0.57 (01/05/20) -> 0.137  Today, improvement likely 2/2 interim initiation of ACE-i. Renal biopsy on 12/30/17 showed immune complex mediated glomerulopathy that may be consistent with minor recurrence of lupus nephritis. Ctn current Cellcept dose given diarrhea in the past and now normal UP/C.     9. Health maintenance. He received Moderna COVID vaccines on 01/18/20 and 02/08/20. He received a flu shot in clinic 09/08/19. Has completed Shingrix series.     We discussed the importance of continued social distancing, wearing a mask outside the home, and hand washing/sanitizing despite his vaccination given his immunosuppression.    11. Will see patient back in 4 months, or sooner if needed.

## 2020-05-02 NOTE — Unmapped (Signed)
Saw patient in clinic. Reports he is doing well. Recently flew to [place] to see brother who had a stroke but is doing better.    No concerns for today.    Denies any swelling, HA, CP, heart arrhythmia, SOB, dizziness, abdominal pain, n/v/d, urinary problems, numbness/tingling, tremors, or fevers.    Labs today. Took prograf at 11pm    COVID vaccine moderna 2/4 and 2/25 moderna, no side effects

## 2020-05-03 LAB — CMV DNA, QUANTITATIVE, PCR

## 2020-05-03 LAB — CMV VIRAL LD: Lab: NOT DETECTED

## 2020-05-03 NOTE — Unmapped (Signed)
[facility] Refill Coordination Note    Specialty Medication(s) to be Shipped:   Transplant: Prograf 1mg     Other medication(s) to be shipped: N/A     M[facility], DOB: [birth year]  Phone: [phone] (home)       All above HIPAA information was verified with patient.     Was a Nurse, learning disability used for this call? No    Completed refill call assessment today to schedule patient's medication shipment from the [facility] [phone]).       Specialty medication(s) and dose(s) confirmed: Regimen is correct and unchanged.   Changes to medications: [name] reports no changes at this time.  Changes to insurance: No  Questions for the pharmacist: No    Confirmed patient received Welcome Packet with first shipment. The patient will receive a drug information handout for each medication shipped and additional FDA Medication Guides as required.       DISEASE/MEDICATION-SPECIFIC INFORMATION        N/A    SPECIALTY MEDICATION ADHERENCE     Medication Adherence    Patient reported X missed doses in the last month: 0  Specialty Medication: Prograf 1mg   Patient is on additional specialty medications: No  Adherence tools used: patient uses a pill box to manage medications, calendar          Prograf 1 mg: 9 days of medicine on hand     SHIPPING     Shipping address confirmed in Epic.     Delivery Scheduled: Yes, Expected medication delivery date: 05/10/2020.     Medication will be delivered via UPS to the prescription address in Epic WAM.    [facility]

## 2020-05-07 LAB — BK VIRUS QUANTITATIVE PCR, BLOOD: BK BLOOD RESULT: NOT DETECTED

## 2020-05-07 LAB — BK BLOOD COMMENT: Lab: 0

## 2020-05-09 MED ORDER — OMEPRAZOLE 20 MG CAPSULE,DELAYED RELEASE
ORAL_CAPSULE | Freq: Every day | ORAL | 3 refills | 90.00000 days | Status: CP
Start: 2020-05-09 — End: ?

## 2020-05-09 MED FILL — PROGRAF 1 MG CAPSULE: 30 days supply | Qty: 150 | Fill #5 | Status: AC

## 2020-05-09 MED FILL — PROGRAF 1 MG CAPSULE: 30 days supply | Qty: 150 | Fill #5

## 2020-05-25 MED ORDER — FUROSEMIDE 40 MG TABLET
ORAL_TABLET | 3 refills | 0 days
Start: 2020-05-25 — End: ?

## 2020-05-27 MED ORDER — FUROSEMIDE 20 MG TABLET
ORAL_TABLET | 3 refills | 0 days | Status: CP
Start: 2020-05-27 — End: ?

## 2020-05-27 NOTE — Unmapped (Signed)
Patient called to get a refill on lasix    Reports he is well and denies any other needs

## 2020-06-03 NOTE — Unmapped (Signed)
[facility] Refill Coordination Note    Specialty Medication(s) to be Shipped:   Transplant: Prograf 1mg     Other medication(s) to be shipped: none     [name], DOB: August 24, 1951  Phone: [phone] (home)       All above HIPAA information was verified with patient.     Was a Nurse, learning disability used for this call? No    Completed refill call assessment today to schedule patient's medication shipment from the [facility] [phone]).       Specialty medication(s) and dose(s) confirmed: Regimen is correct and unchanged.   Changes to medications: Jony reports no changes at this time.  Changes to insurance: No  Questions for the pharmacist: No    Confirmed patient received Welcome Packet with first shipment. The patient will receive a drug information handout for each medication shipped and additional FDA Medication Guides as required.       DISEASE/MEDICATION-SPECIFIC INFORMATION        N/A    SPECIALTY MEDICATION ADHERENCE     Medication Adherence    Patient reported X missed doses in the last month: 0  Adherence tools used: patient uses a pill box to manage medications, calendar           Prograf 1mg : 14 days worth of medication on hand.              SHIPPING     Shipping address confirmed in Epic.     Delivery Scheduled: Yes, Expected medication delivery date: 06/12/20.     Medication will be delivered via UPS to the prescription address in Epic WAM.    [place] A Revel Stellmach   [facility] Specialty Technician

## 2020-06-11 MED FILL — PROGRAF 1 MG CAPSULE: 30 days supply | Qty: 150 | Fill #6

## 2020-06-11 MED FILL — PROGRAF 1 MG CAPSULE: 30 days supply | Qty: 150 | Fill #6 | Status: AC

## 2020-06-18 DIAGNOSIS — Z940 Kidney transplant status: Principal | ICD-10-CM

## 2020-06-18 MED ORDER — ALLOPURINOL 100 MG TABLET
ORAL_TABLET | Freq: Every day | ORAL | 11 refills | 30 days | Status: CP
Start: 2020-06-18 — End: ?

## 2020-06-18 MED ORDER — MYCOPHENOLATE MOFETIL 250 MG CAPSULE
ORAL_CAPSULE | Freq: Two times a day (BID) | ORAL | 0 refills | 3.00000 days | Status: CP
Start: 2020-06-18 — End: 2021-06-18

## 2020-06-18 MED FILL — MYCOPHENOLATE MOFETIL 250 MG CAPSULE: 3 days supply | Qty: 6 | Fill #0 | Status: AC

## 2020-06-18 NOTE — Unmapped (Signed)
Patient called and said that his shipment for cellcept got delayed and he ran out of his cellcept.  New shipment will arrive on Thursday.    Patient will need to pay out of pocket for 2-3 days worth of medication    Called [facility] patient pharmacy and patient will pick up there

## 2020-06-19 DIAGNOSIS — Z940 Kidney transplant status: Principal | ICD-10-CM

## 2020-06-19 MED ORDER — MYCOPHENOLATE MOFETIL 250 MG CAPSULE
ORAL_CAPSULE | Freq: Two times a day (BID) | ORAL | 0 refills | 3.00000 days | Status: CP
Start: 2020-06-19 — End: 2021-06-19
  Filled 2020-06-18: qty 6, 3d supply, fill #0

## 2020-06-22 NOTE — Unmapped (Signed)
Update UNOS form

## 2020-07-02 MED ORDER — LEVOTHYROXINE 25 MCG TABLET
ORAL_TABLET | 0 refills | 0.00000 days
Start: 2020-07-02 — End: ?

## 2020-07-02 NOTE — Unmapped (Signed)
[facility] Clinical Assessment & Refill Coordination Note    [facility], DOB: Jan 02, 1951  Phone: [phone] (home)     All above HIPAA information was verified with patient.     Was a Nurse, learning disability used for this call? No    Specialty Medication(s):   Transplant: Prograf 1mg      Current Outpatient Medications   Medication Sig Dispense Refill   ??? acyclovir (ZOVIRAX) 400 MG tablet TAKE (1) TABLET BY MOUTH EVERY DAY 90 tablet 3   ??? allopurinoL (ZYLOPRIM) 100 MG tablet Take 1 tablet (100 mg total) by mouth daily. 30 tablet 11   ??? aspirin (ASPIRIN LOW-STRENGTH) 81 MG chewable tablet Chew 81 mg daily.     ??? atorvastatin (LIPITOR) 20 MG tablet Take 1 tablet (20 mg total) by mouth nightly. 90 each 3   ??? calcitrioL (ROCALTROL) 0.25 MCG capsule Take 1 capsule (0.25 mcg total) by mouth every Monday, Wednesday, and Friday at 1600. 36 capsule 3   ??? CELLCEPT 250 mg capsule Take 1 capsule (250 mg total) by mouth Two (2) times a day. 60 capsule 11   ??? colchicine (COLCRYS) 0.6 mg tablet Take 1 tablet (0.6 mg total) by mouth daily as needed for up to 1 dose. 30 tablet 1   ??? enalapril (VASOTEC) 5 MG tablet TAKE (1) TABLET BY MOUTH TWICE DAILY 180 tablet 3   ??? ferrous sulfate 325 (65 FE) MG tablet Take 1 tablet (325 mg total) by mouth daily.  0   ??? fluticasone (FLONASE) 50 mcg/actuation nasal spray 1 spray into each nostril daily as needed.     ??? furosemide (LASIX) 20 MG tablet Take 40 mg in the morning and 20 mg in the evening. 270 tablet 3   ??? levothyroxine (SYNTHROID) 200 MCG tablet TAKE ONE TABLET EVERY DAY WITH (2) FOR A TOTAL DAILY DOSE OF 90 tablet 3   ??? levothyroxine (SYNTHROID) 200 MCG tablet Take 1 tab with two tab, total dose daily 90 tablet 3   ??? levothyroxine (SYNTHROID) 25 MCG tablet TAKE (2) TABLETS BY MOUTH EVERY DAY WITH1 TAB FOR A TOTAL DAILY DOSE OF 180 tablet 0   ??? magnesium oxide (MAG-OX) 400 mg (241.3 mg magnesium) tablet TAKE (1) TABLET BY MOUTH TWICE DAILY 180 each 0   ??? metoprolol succinate (TOPROL-XL) 100 MG 24 hr tablet TAKE (1) TABLET BY MOUTH EVERY DAY 90 each 3   ??? mycophenolate (CELLCEPT) 250 mg capsule Take 1 capsule (250 mg total) by mouth Two (2) times a day. 6 capsule 0   ??? omeprazole (PRILOSEC) 20 MG capsule Take 1 capsule (20 mg total) by mouth daily. 90 capsule 3   ??? predniSONE (DELTASONE) 5 MG tablet Take 1 tablet (5 mg total) by mouth daily. 90 each 3   ??? PROGRAF 1 mg capsule TAKE 3 CAPSULES (3MG ) BY MOUTH IN THE MORNING AND 2 CAPSULES (2MG ) BY MOUTH IN THE EVENING. 180 capsule 11   ??? simethicone (GAS-X) 80 MG chewable tablet Chew 1 tablet (80 mg total) every six (6) hours as needed for flatulence. 30 tablet 1     No current facility-administered medications for this visit.        Changes to medications: Yuki reports no changes at this time.    Allergies   Allergen Reactions   ??? Penicillins Hives     Other reaction(s): HIVES       Changes to allergies: No  SPECIALTY MEDICATION ADHERENCE     Prograf 1mg   : 12 days of medicine on hand     Medication Adherence    Patient reported X missed doses in the last month: 0  Specialty Medication: prograf 1mg   Adherence tools used: patient uses a pill box to manage medications, calendar          Specialty medication(s) dose(s) confirmed: Regimen is correct and unchanged.     Are there any concerns with adherence? No    Adherence counseling provided? Not needed    CLINICAL MANAGEMENT AND INTERVENTION      Clinical Benefit Assessment:    Do you feel the medicine is effective or helping your condition? Yes    Clinical Benefit counseling provided? Not needed    Adverse Effects Assessment:    Are you experiencing any side effects? No    Are you experiencing difficulty administering your medicine? No    Quality of Life Assessment:    How many days over the past month did your transplant  keep you from your normal activities? For example, brushing your teeth or getting up in the morning. 0 Have you discussed this with your provider? Not needed    Therapy Appropriateness:    Is therapy appropriate? Yes, therapy is appropriate and should be continued    DISEASE/MEDICATION-SPECIFIC INFORMATION      N/A    PATIENT SPECIFIC NEEDS     - Does the patient have any physical, cognitive, or cultural barriers? No    - Is the patient high risk? Yes, patient is taking a REMS drug. Medication is dispensed in compliance with REMS program. but not from [facility]    - Does the patient require a Care Management Plan? No     - Does the patient require physician intervention or other additional services (i.e. nutrition, smoking cessation, social work)? No      SHIPPING     Specialty Medication(s) to be Shipped:   Transplant: Prograf 1mg     Other medication(s) to be shipped: na     Changes to insurance: No    Delivery Scheduled: Yes, Expected medication delivery date: 07/11/2020.     Medication will be delivered via UPS to the confirmed prescription address in [facility].    The patient will receive a drug information handout for each medication shipped and additional FDA Medication Guides as required.  Verified that patient has previously received a Conservation officer, historic buildings.    All of the patient's questions and concerns have been addressed.    [name]   [facility] Specialty Pharmacist

## 2020-07-10 DIAGNOSIS — Z940 Kidney transplant status: Principal | ICD-10-CM

## 2020-07-10 MED ORDER — CELLCEPT 250 MG CAPSULE
ORAL_CAPSULE | Freq: Two times a day (BID) | ORAL | 11 refills | 30 days | Status: CP
Start: 2020-07-10 — End: 2021-07-10

## 2020-07-10 MED FILL — PROGRAF 1 MG CAPSULE: 30 days supply | Qty: 150 | Fill #7

## 2020-07-10 MED FILL — PROGRAF 1 MG CAPSULE: 30 days supply | Qty: 150 | Fill #7 | Status: AC

## 2020-07-10 NOTE — Unmapped (Signed)
Patient called to say he needs new referral for cellcept MAP    New script sent to [facility] and spoke to patient and he has 13 days on hand will call back if he has not heard anything or has 5 days left of medication    Denies any other needs

## 2020-07-17 NOTE — Unmapped (Signed)
left voicemail and sent MyChart message, called to schedule follow up appt with Dr. Carlene Coria.  Can be scheduled from the Recall tab. 8/4 EW

## 2020-07-19 NOTE — Unmapped (Signed)
Called patient back    Reports he has not heard anythign about his MAP for Cellcept.    Per MAP, waiting on income verification.    Called patient and he states he has not seen anything about what he needs to submit.  Let him know I will follow up and we can do generic cellcept if needed. He is not out of medication.    Patient to call back Monday    Denies any other needs

## 2020-07-23 DIAGNOSIS — Z940 Kidney transplant status: Principal | ICD-10-CM

## 2020-07-23 MED ORDER — PREDNISONE 5 MG TABLET
ORAL_TABLET | Freq: Every day | ORAL | 3 refills | 90 days | Status: CP
Start: 2020-07-23 — End: ?

## 2020-08-02 NOTE — Unmapped (Signed)
[facility] Refill Coordination Note    Specialty Medication(s) to be Shipped:   Transplant: Prograf 1mg     Other medication(s) to be shipped: No additional medications requested for fill at this time     ADRAIN NESBIT, DOB: 06-04-1951  Phone: [phone] (home)       All above HIPAA information was verified with patient.     Was a Nurse, learning disability used for this call? No    Completed refill call assessment today to schedule patient's medication shipment from the [facility] [phone]).       Specialty medication(s) and dose(s) confirmed: Regimen is correct and unchanged.   Changes to medications: Jesper reports no changes at this time.  Changes to insurance: No  Questions for the pharmacist: No    Confirmed patient received Welcome Packet with first shipment. The patient will receive a drug information handout for each medication shipped and additional FDA Medication Guides as required.       DISEASE/MEDICATION-SPECIFIC INFORMATION        N/A    SPECIALTY MEDICATION ADHERENCE     Medication Adherence    Patient reported X missed doses in the last month: 0  Adherence tools used: patient uses a pill box to manage medications, calendar              Prograf 1mg : 10 days worth of medication on hand.        SHIPPING     Shipping address confirmed in Epic.     Delivery Scheduled: Yes, Expected medication delivery date: 08/07/20.     Medication will be delivered via UPS to the prescription address in Epic WAM.    [place] A Clydean Posas   [facility] Specialty Technician

## 2020-08-06 MED FILL — PROGRAF 1 MG CAPSULE: 30 days supply | Qty: 150 | Fill #8 | Status: AC

## 2020-08-06 MED FILL — PROGRAF 1 MG CAPSULE: 30 days supply | Qty: 150 | Fill #8

## 2020-08-27 MED ORDER — ALLOPURINOL 100 MG TABLET
ORAL_TABLET | Freq: Every day | ORAL | 11 refills | 30 days | Status: CP
Start: 2020-08-27 — End: ?

## 2020-08-27 MED ORDER — LEVOTHYROXINE 25 MCG TABLET
ORAL_TABLET | 0 refills | 0 days
Start: 2020-08-27 — End: ?

## 2020-08-27 MED ORDER — COLCHICINE 0.6 MG TABLET
ORAL_TABLET | 1 refills | 0 days
Start: 2020-08-27 — End: ?

## 2020-08-27 MED ORDER — CALCITRIOL 0.25 MCG CAPSULE
ORAL_CAPSULE | 0 refills | 0 days
Start: 2020-08-27 — End: ?

## 2020-08-27 MED ORDER — FUROSEMIDE 40 MG TABLET
ORAL_TABLET | 3 refills | 0 days
Start: 2020-08-27 — End: ?

## 2020-09-04 DIAGNOSIS — R7989 Other specified abnormal findings of blood chemistry: Principal | ICD-10-CM

## 2020-09-04 DIAGNOSIS — C649 Malignant neoplasm of unspecified kidney, except renal pelvis: Principal | ICD-10-CM

## 2020-09-04 DIAGNOSIS — Z940 Kidney transplant status: Principal | ICD-10-CM

## 2020-09-04 DIAGNOSIS — N186 End stage renal disease: Principal | ICD-10-CM

## 2020-09-04 NOTE — Unmapped (Signed)
[facility] Refill Coordination Note    Specialty Medication(s) to be Shipped:   Transplant: Prograf 1mg     Other medication(s) to be shipped: No additional medications requested for fill at this time     [facility], DOB: [birth year]  Phone: [phone] (home)       All above HIPAA information was verified with patient.     Was a Nurse, learning disability used for this call? No    Completed refill call assessment today to schedule patient's medication shipment from the [facility] [phone]).       Specialty medication(s) and dose(s) confirmed: Regimen is correct and unchanged.   Changes to medications: Raden reports no changes at this time.  Changes to insurance: No  Questions for the pharmacist: No    Confirmed patient received Welcome Packet with first shipment. The patient will receive a drug information handout for each medication shipped and additional FDA Medication Guides as required.       DISEASE/MEDICATION-SPECIFIC INFORMATION        N/A    SPECIALTY MEDICATION ADHERENCE     Medication Adherence    Patient reported X missed doses in the last month: 0  Specialty Medication: Prograf 1mg   Patient is on additional specialty medications: No  Adherence tools used: patient uses a pill box to manage medications, calendar                Prograf 1 mg: 13 days of medicine on hand       SHIPPING     Shipping address confirmed in Epic.     Delivery Scheduled: Yes, Expected medication delivery date: 09/12/20.     Medication will be delivered via UPS to the prescription address in Epic WAM.    [name]   [facility] Specialty Pharmacist

## 2020-09-05 ENCOUNTER — Ambulatory Visit: Admit: 2020-09-05 | Discharge: 2020-09-06 | Payer: MEDICARE

## 2020-09-05 ENCOUNTER — Ambulatory Visit: Admit: 2020-09-05 | Discharge: 2020-09-06 | Payer: MEDICARE | Attending: Nephrology | Primary: Nephrology

## 2020-09-05 DIAGNOSIS — Z940 Kidney transplant status: Principal | ICD-10-CM

## 2020-09-05 DIAGNOSIS — R7989 Other specified abnormal findings of blood chemistry: Principal | ICD-10-CM

## 2020-09-05 DIAGNOSIS — N186 End stage renal disease: Principal | ICD-10-CM

## 2020-09-05 LAB — URINALYSIS
BILIRUBIN UA: NEGATIVE
BLOOD UA: NEGATIVE
GLUCOSE UA: NEGATIVE
KETONES UA: NEGATIVE
NITRITE UA: NEGATIVE
PH UA: 5.5 (ref 5.0–9.0)
PROTEIN UA: NEGATIVE
RBC UA: <1 /HPF (ref <3)
SPECIFIC GRAVITY UA: 1.015 (ref 1.005–1.030)
SQUAMOUS EPITHELIAL: <1 /HPF (ref 0–5)
UROBILINOGEN UA: 0.2
WBC UA: 2 /HPF — ABNORMAL HIGH (ref <2)

## 2020-09-05 LAB — CBC W/ AUTO DIFF
BASOPHILS ABSOLUTE COUNT: 0 10*9/L (ref 0.0–0.1)
BASOPHILS RELATIVE PERCENT: 0.3 %
EOSINOPHILS ABSOLUTE COUNT: 0.1 10*9/L (ref 0.0–0.7)
EOSINOPHILS RELATIVE PERCENT: 2.6 %
HEMATOCRIT: 38.3 % (ref 38.0–50.0)
HEMOGLOBIN: 12 g/dL — ABNORMAL LOW (ref 13.5–17.5)
LYMPHOCYTES RELATIVE PERCENT: 19 %
MEAN CORPUSCULAR HEMOGLOBIN: 27.7 pg (ref 26.0–34.0)
MEAN CORPUSCULAR VOLUME: 88.7 fL (ref 81.0–95.0)
MEAN PLATELET VOLUME: 10.5 fL — ABNORMAL HIGH (ref 7.0–10.0)
MONOCYTES ABSOLUTE COUNT: 0.4 10*9/L (ref 0.1–1.0)
MONOCYTES RELATIVE PERCENT: 8.5 %
NEUTROPHILS ABSOLUTE COUNT: 3.2 10*9/L (ref 1.7–7.7)
NEUTROPHILS RELATIVE PERCENT: 69.6 %
PLATELET COUNT: 107 10*9/L — ABNORMAL LOW (ref 150–450)
RED BLOOD CELL COUNT: 4.32 10*12/L (ref 4.32–5.72)
RED CELL DISTRIBUTION WIDTH: 14.5 % (ref 12.0–15.0)

## 2020-09-05 LAB — LIPID PANEL
CHOLESTEROL: 116 mg/dL (ref <=200)
HDL CHOLESTEROL: 43 mg/dL (ref 40–60)
NON-HDL CHOLESTEROL: 73 mg/dL (ref 70–130)
TRIGLYCERIDES: 91 mg/dL (ref 0–150)
VLDL CHOLESTEROL CAL: 18.2 mg/dL (ref 12–42)

## 2020-09-05 LAB — BASOPHILS RELATIVE PERCENT: Basophils/100 leukocytes:NFr:Pt:Bld:Qn:Automated count: 0.3

## 2020-09-05 LAB — BASIC METABOLIC PANEL
BLOOD UREA NITROGEN: 63 mg/dL — ABNORMAL HIGH (ref 9–23)
BUN / CREAT RATIO: 23
CALCIUM: 10.3 mg/dL (ref 8.7–10.4)
CHLORIDE: 108 mmol/L — ABNORMAL HIGH (ref 98–107)
CO2: 26.4 mmol/L (ref 20.0–31.0)
EGFR CKD-EPI AA MALE: 26 mL/min/{1.73_m2} — ABNORMAL LOW (ref >=60)
EGFR CKD-EPI NON-AA MALE: 23 mL/min/{1.73_m2} — ABNORMAL LOW (ref >=60)
POTASSIUM: 4.4 mmol/L (ref 3.4–4.5)
SODIUM: 142 mmol/L (ref 135–145)

## 2020-09-05 LAB — PROTEIN URINE: Protein:MCnc:Pt:Urine:Qn:: <6

## 2020-09-05 LAB — FASTING

## 2020-09-05 LAB — HEMOGLOBIN A1C
HEMOGLOBIN A1C: 6 % — ABNORMAL HIGH (ref 4.8–5.6)
Hemoglobin A1c/Hemoglobin.total:MFr:Pt:Bld:Qn:: 6 — ABNORMAL HIGH

## 2020-09-05 LAB — PHOSPHORUS: Phosphate:MCnc:Pt:Ser/Plas:Qn:: 3.1

## 2020-09-05 LAB — SPECIFIC GRAVITY UA: Specific gravity:Rden:Pt:Urine:Qn:: 1.015

## 2020-09-05 LAB — BUN / CREAT RATIO: Urea nitrogen/Creatinine:MRto:Pt:Ser/Plas:Qn:: 23

## 2020-09-05 LAB — MAGNESIUM: Magnesium:MCnc:Pt:Ser/Plas:Qn:: 1.9

## 2020-09-05 LAB — PROTEIN / CREATININE RATIO, URINE: CREATININE, URINE: 64.5 mg/dL

## 2020-09-05 NOTE — Unmapped (Signed)
AOBP:Left arm  medium cuff   Average:170/84 Pulse:69  1st reading:167/86 Pulse:69  2nd reading:172/82 Pulse:69  3rd reading:170/85 Pulse:69

## 2020-09-05 NOTE — Unmapped (Signed)
Saw patient in clinic today. Some of the congregation back in the church for service but still streaming on FB. Still wearing masks    Got flu shot today and will get 3rd covid shot    Denies swelling, CP, heart palp, HA, sob, dizziness, abdominal pain, n/v/d, urinary problems numbness/tonglin, tremors, or fevers    BP at home 120/80-90. Above 90 maybe once a week    No recent gout    Labs today and took tac at 1015pm

## 2020-09-05 NOTE — Unmapped (Signed)
Addended by: Theora Gianotti on: 09/05/2020 11:03 AM     Modules accepted: Orders

## 2020-09-05 NOTE — Unmapped (Addendum)
Transplant Nephrology Clinic Visit      History of Present Illness    Patient is a 69 y.o. male who underwent deceased donor transplant on 2007-07-10 secondary to lupus nephritis. His post transplant course has been complicated by early cellular rejection treated with Thymoglobulin. At that time, he was enrolled in the JAK-3 inhibitor trial and was taken off study drug. Additionally, he was noted to have a large pericardial effusion of greater than 1 L in 08/2007. At that time his TSH was 258. This improved with pericardiocentesis and thyroid replacement and has not reaccumulated. In 2009, he had coronary artery disease which required stenting. Patient does not have evidence of donor specific antibodies. His creatinine was between 1.5 and 2.2, but has been more in the low to mid 2s since his right native nephrectomy on 02/20/14 for renal cell carcinoma.  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 No evidence of DSA antibodies as of 09/08/19. Most recent baseline creatinine while hospitalized was 2.6-2.8, had been around 2.2-2.6 immediately prior to that.    He was admitted to [facility] from 9/15 through 08/31/2016 after presenting with fever and leukocytosis. His initial urine culture specimen was lost, but he was treated for presumptive UTI. He improved clinically on antibiotics. During that evaluation he had a CT of the abdomen on 08/29/2016 that showed New indeterminate liver lesions concerning for metastatic disease versus post transplant lymphoproliferative disorder versus abscesses. He then underwent MRI on 9/18 which showed Two liver lesions with minimal enhancement. Differential includes post transplant lymphoproliferative disorder, sequelae of prior infection or hematoma, less likely acute abscess. Metastases unlikely given enhancement characteristics. EBV was negative.    As an outpatient he then underwent a PET scan on 09/14/2016 which showed Enlarging right hepatic lobe lesion with intense FDG uptake and central area of photopenia, concerning for necrotic metastasis, less likely abscess. Stable right hepatic dome lesion with intense FDG uptake, concerning for additional metastasis. After consult with radiology, they felt the best approach was ultrasound guided biopsy which he had on 10/01/2016. Pathology revealed Fragments of liver tissue with parenchymal extinction, mixed inflammatory infiltrates, and reactive-appearing stromal changes (see comment), - Increased iron staining within adjacent intact hepatic parenchyma (grade 3 of 4), - Sinusoidal congestion. The differential diagnosis includes sequelae from an abscess (e.g., inflammatory pseudotumor) versus nonspecific inflammatory changes adjacent to an unsampled mass. Correlation with the radiologic findings and clinical follow up are suggested.    The patient had a repeat MRI on 02/01/17 to visualize lesions seen on MRI in September. His repeat MRI revealed interval moderate decrease in the size of previously detected liver lesions, which could represent the sequelae of resolving right hepatic lobe infection with interval decrease in size of large right hepatic lobe lesion with internal T1 hyperintensity likely reflecting proteinacious material and mild internal and perilesional enhancement. Interval decrease in size and conspicuity of small hepatic dome lesion is also noted and this could also represent a resolving abscess. They recommended a re-scan in approximately 3 months (04/2017). I reviewed the report and images with Dr. Rush Barer who agreed that the lesion continues to look like resolving infection.     Patient underwent renal biopsy on 12/30/2017 to evaluate proteinuria which had increased over the last year from 0.75 to 1.4. 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 no proliferative glomerular lesions. These changes can indicate a minor recurrence of the lupus nephritis not carrying any direct therapeutic and prognostic significance at the present time.); Tubular pigment deposits, consistent with lipofuscin.    He was admitted 3/1 through 02/14/18 with RSV infection, AKI and new diastolic and systolic heart failure. TTE showed moderate EF 40-45%, reduced from 52% reported in January 2015. Patient's home lasix was increased to 40 mg qd from 20 mg qd with improvement in symptoms. Creatinine peaked at 2.73, 2.69 on discharge. It was suspected that patient's RSV infection worsened his heart failure and caused AKI is due to reduced perfusion. Patient's enalapril was held on discharge.     He was admitted 11/4 through 10/21/18 after presenting with dyspnea secondary to acute on chronic systolic heart failure. Echo showed globally, mildly decreased EF of 45% and grade III diastolic dysfunction. Patient was diuresed with lasix and discharged home on 80 mg bid. He was also found to have 12 L/min flow on PVL fistula study and underwent fistula banding to prevent worsening cardiac function from high output heart failure. Creatinine was elevated at time of admission to 3 but downtrended with diuresis to 2.3-2.8. Tac dose was decreased to 3 mg qAM and 2 mg qPM. No other changes to immunosuppression. Enalapril held due to kidney dysfunction.     At surgery visit on 11/17/18 to discuss fistula ligation (as flow post banding remained at 10 L/min), EKG was ordered and patient noted to be in a-fib. Was able to see cardiology that afternoon. No episodes of a-fib while monitored in the hospital and no prior history. Patient asymptomatic and hemodynamically stable. Rate controlled on metoprolol 100 mg daily, not started on anticoagulation at that time.      He underwent banding of right upper AV fistula on 02/07/19. Tolerated procedure well. He was seen in the emergency department on 02/11/19, presenting with increased swelling and intermittent bleeding from AV fistula incision site. Vascular surgery and Dr. Norma Fredrickson were consulted who both agreed patient was stable for discharge home, so long as there was no hematoma or signs of infection.     Interval history since last visit 05/02/20    He presents today for follow up.    He has been feeling well and is without complaints today. Notes blood pressures at home 120s/80-90s. Diastolic only over 90 one time.    He specifically denies fever, myalgias, upper respiratory/GI symptoms, or change in taste/smell.    Last dose of Prograf: 10:15 pm     Review of Systems    Otherwise on review of systems patient denies fever or chills, chest pain, SOB, PND, orthopnea or edema. No N/V/abdominal pain. No current diarrhea. No dysuria, hematuria or difficulty voiding. All other systems are reviewed and are negative.    Medications    Current Outpatient Medications   Medication Sig Dispense Refill   ??? acyclovir (ZOVIRAX) 400 MG tablet TAKE (1) TABLET BY MOUTH EVERY DAY 90 tablet 3   ??? allopurinoL (ZYLOPRIM) 100 MG tablet Take 1 tablet (100 mg total) by mouth daily. 30 tablet 11   ??? aspirin (ASPIRIN LOW-STRENGTH) 81 MG chewable tablet Chew 81 mg daily.     ??? atorvastatin (LIPITOR) 20 MG tablet Take 1 tablet (20 mg total) by mouth nightly. 90 each 3   ??? calcitrioL (ROCALTROL) 0.25 MCG capsule Take 1 capsule (0.25 mcg total) by mouth every Monday, Wednesday, and  Friday at 1600. 36 capsule 3   ??? CELLCEPT 250 mg capsule Take 1 capsule (250 mg total) by mouth Two (2) times a day. 60 capsule 11   ??? colchicine (COLCRYS) 0.6 mg tablet Take 1 tablet (0.6 mg total) by mouth daily as needed for up to 1 dose. 30 tablet 1   ??? enalapril (VASOTEC) 5 MG tablet TAKE (1) TABLET BY MOUTH TWICE DAILY 180 tablet 3   ??? ferrous sulfate 325 (65 FE) MG tablet Take 1 tablet (325 mg total) by mouth daily.  0   ??? fluticasone (FLONASE) 50 mcg/actuation nasal spray 1 spray into each nostril daily as needed.     ??? furosemide (LASIX) 20 MG tablet Take 40 mg in the morning and 20 mg in the evening. 270 tablet 3   ??? levothyroxine (SYNTHROID) 200 MCG tablet TAKE ONE TABLET EVERY DAY WITH (2) FOR A TOTAL DAILY DOSE OF 90 tablet 3   ??? levothyroxine (SYNTHROID) 200 MCG tablet Take 1 tab with two tab, total dose daily 90 tablet 3   ??? levothyroxine (SYNTHROID) 25 MCG tablet TAKE (2) TABLETS BY MOUTH EVERY DAY WITH1 TAB FOR A TOTAL DAILY DOSE OF 180 tablet 3   ??? magnesium oxide (MAG-OX) 400 mg (241.3 mg magnesium) tablet TAKE (1) TABLET BY MOUTH TWICE DAILY 180 each 0   ??? metoprolol succinate (TOPROL-XL) 100 MG 24 hr tablet TAKE (1) TABLET BY MOUTH EVERY DAY 90 each 3   ??? omeprazole (PRILOSEC) 20 MG capsule Take 1 capsule (20 mg total) by mouth daily. 90 capsule 3   ??? polyethylene glycol (GOLYTELY) 236-22.74-6.74 gram solution Take as directed per instruction sheet provided by [facility] provider 4000 mL 0   ??? predniSONE (DELTASONE) 5 MG tablet Take 1 tablet (5 mg total) by mouth daily. 90 tablet 3   ??? PROGRAF 1 mg capsule TAKE 3 CAPSULES (3MG ) BY MOUTH IN THE MORNING AND 2 CAPSULES (2MG ) BY MOUTH IN THE EVENING. 180 capsule 11   ??? simethicone (GAS-X) 80 MG chewable tablet Chew 1 tablet (80 mg total) every six (6) hours as needed for flatulence. 30 tablet 1     No current facility-administered medications for this visit.       Physical Exam    BP 170/84 (BP Site: L Arm, BP Position: Sitting, BP Cuff Size: Medium)  - Pulse 69  - Temp 36.4 ??C (97.6 ??F) (Temporal)  - Ht 182.9 cm (6')  - Wt 74.6 kg (164 lb 6.4 oz)  - BMI 22.30 kg/m??   General: Patient is a pleasant male in no apparent distress.  Eyes: Sclera anicteric. No discharge   ENT: Mask in place.  Neck: Supple without LAD/JVD/bruits. No lymphadenopathy appreciated   Lungs: Clear to auscultation bilaterally, no wheezes/rales/rhonchi.  Cardiovascular: Regular rate and rhythm without murmurs, rubs or gallops.  Abdomen: Soft, notender/nondistended. Positive bowel sounds. No tenderness over the graft.  Extremities: Without edema, joints without evidence of synovitis.  Skin: Without rash.  Neurological: Grossly nonfocal.  Psychiatric: Mood and affect appropriate.      Laboratory Results      Results for orders placed or performed in visit on 09/05/20   Culture, Urine    Specimen: Clean Catch; Urine   Result Value Ref Range    Urine Culture, Comprehensive NO GROWTH    Protein/Creatinine Ratio, Urine   Result Value Ref Range    Creat U 64.5 Undefined mg/dL    Protein, Ur <1.6  mg/dL    Protein/Creatinine Ratio, Urine     Urinalysis   Result Value Ref Range    Color, UA Yellow     Clarity, UA Clear     Specific Gravity, UA 1.015 1.005 - 1.030    pH, UA 5.5 5.0 - 9.0    Leukocyte Esterase, UA Negative Negative    Nitrite, UA Negative Negative    Protein, UA Negative Negative    Glucose, UA Negative Negative    Ketones, UA Negative Negative    Urobilinogen, UA 0.2 mg/dL 0.2 - 2.0 mg/dL    Bilirubin, UA Negative Negative    Blood, UA Negative Negative    RBC, UA <1 <3 /HPF    WBC, UA 2 (H) <2 /HPF    Squam Epithel, UA <1 0 - 5 /HPF    Bacteria, UA Rare (A) None Seen /HPF    Mucus, UA Rare (A) None Seen /HPF   Cytology - Urine   Result Value Ref Range    Final Diagnosis       A. Urine for decoy cells:  - No high grade urothelial carcinoma identified  - Atypical squamous cells of undetermined significance  - No decoy cells identified    This electronic signature is attestation that the pathologist personally reviewed the submitted material(s) and the final diagnosis reflects that evaluation.      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      Gross Description            Urine clean catch voided decoy    60mL clear, yellow fluid, unfixed    Materials Prepared & Examined    Smear Slides.......0  Monolayers..........1  Cytospins.............0  Cell Blocks...........0  Core Biopsy.........0  Touch Prep..........0      Microscopic Description       Microscopic examination substantiates the above diagnosis.    Resident Physician: None Assigned      EMBEDDED IMAGES      Specimen Adequacy Satisfactory for evaluation     Disclaimer       Unless otherwise specified, specimens are preserved using 10% neutral buffered formalin. For cases in which immunohistochemical and/or in-situ hybridization stains are performed, the following statement applies: Appropriate controls for each stain (positive controls with or without negative controls) have been evaluated and stain as expected. These stains have not been separately validated for use on decalcified specimens and should be interpreted with caution in that setting. Some of the reagents used for these stains may be classified as analyte specific reagents (ASR). Tests using ASRs were developed, and their performance characteristics were determined, by the Anatomic Pathology Department [facility]). They have not been cleared or approved by the [place] Food and Drug Administration (FDA). The FDA does not require these tests to go through premarket FDA review. These tests are used for clinical purposes. They should not be regarded as investigational or for research. This laboratory is certified under the Clinical Laboratory Improvement Amendments (CLIA) as qualified to perform high complexity clinical laboratory testing.       *Note: Due to a large number of results and/or encounters for the requested time period, some results have not been displayed. A complete set of results can be found in Results Review.         Assessment and Plan    1. Status post renal transplant. Creatinine today just slightly above his most recent baseline at 2.77. His Prograf  trough of 5.1 is a 13 hour trough and close to goal of 6-8. He remains on a reduced dose of CellCept 250mg  BID with history of diarrhea and malignancy. Will continue current immunosuppression.    2. Fluid overload and HFrEF. He underwent  fistula banding in February 2020 and fluid retention/dyspnea have resolved since that time. He is euvolemic today on current Lasix dose. CXR showed Unchanged mild cardiomegaly with background pulmonary vascular congestion.    3. Right renal cell carcinoma status post nephrectomy. Imaging previously showed hepatic mass that was biopsied and consistent with inflammatory process. MRI from 09/27/19 remains reassuring, with unchanged size and overall appearance of hepatic lesion. Surgery did not recommend any additional diagnostic workup and will continue to monitor annually with imaging.    4. Hypertension. Blood pressure is 170/84, he consistently runs higher in the office. Home blood pressures at goal. Will continue current regimen.    5. Atrial fibrillation. Rate controlled on current dose of metoprolol. He is not currently on anticoagulation given history of thrombocytopenia. Cardiology is managing.      6. Hypothyroidism. On Synthroid at 250 mcg daily.     7. Hypomagnesemia. Magnesium normal today at 1.9 on 400 mg BID.     8. Proteinuria.  UP/C peaked at 1.5 on 05/20/2018 -> 0.355 on 09/08/19 -> 0.57 (01/05/20) -> 0.137 (05/02/20). Undetectable today.    9. Health maintenance. He received Moderna COVID vaccines on 01/18/20 and 02/08/20, got booster today 09/05/20. He received a flu shot in clinic today 09/05/20. Has completed Shingrix series.     We discussed that while we continue to recommend the COVID vaccine for all eligible patients, we know that kidney transplant patients do not respond as strongly due to their immunosuppression, so he should continue to follow the CDC guidelines for patients who are not vaccinated. I would also encourage all household members and close contacts that are eligible for vaccination to get vaccinated. We discussed the importance of ongoing social distancing, wearing a mask outside the home, and hand washing/sanitizing.     10. Will see patient back in 4 months, or sooner if needed.

## 2020-09-06 LAB — EBV VIRAL LOAD RESULT: Lab: NOT DETECTED

## 2020-09-06 LAB — TACROLIMUS, TROUGH: Lab: 5.1

## 2020-09-09 ENCOUNTER — Ambulatory Visit: Admit: 2020-09-09 | Discharge: 2020-09-10 | Disposition: A | Discharge status: Home / Self Care | Payer: MEDICARE

## 2020-09-09 DIAGNOSIS — K921 Melena: Principal | ICD-10-CM

## 2020-09-09 LAB — COMPREHENSIVE METABOLIC PANEL
ALBUMIN: 3.4 g/dL (ref 3.4–5.0)
ALKALINE PHOSPHATASE: 113 U/L (ref 46–116)
ALT (SGPT): 22 U/L (ref 10–49)
ANION GAP: 8 mmol/L (ref 5–14)
AST (SGOT): 21 U/L (ref <=34)
BILIRUBIN TOTAL: 0.7 mg/dL (ref 0.3–1.2)
BLOOD UREA NITROGEN: 65 mg/dL — ABNORMAL HIGH (ref 9–23)
BUN / CREAT RATIO: 25
CALCIUM: 10 mg/dL (ref 8.7–10.4)
CHLORIDE: 108 mmol/L — ABNORMAL HIGH (ref 98–107)
CO2: 26 mmol/L (ref 20.0–31.0)
CREATININE: 2.64 mg/dL — ABNORMAL HIGH
EGFR CKD-EPI NON-AA MALE: 24 mL/min/{1.73_m2} — ABNORMAL LOW (ref >=60)
GLUCOSE RANDOM: 93 mg/dL (ref 70–179)
POTASSIUM: 4.4 mmol/L (ref 3.4–4.5)
PROTEIN TOTAL: 7 g/dL (ref 5.7–8.2)
SODIUM: 142 mmol/L (ref 135–145)

## 2020-09-09 LAB — CBC W/ AUTO DIFF
BASOPHILS RELATIVE PERCENT: 0.4 %
EOSINOPHILS ABSOLUTE COUNT: 0.2 10*9/L (ref 0.0–0.7)
EOSINOPHILS RELATIVE PERCENT: 3.9 %
HEMATOCRIT: 35.8 % — ABNORMAL LOW (ref 38.0–50.0)
HEMOGLOBIN: 11.3 g/dL — ABNORMAL LOW (ref 13.5–17.5)
LYMPHOCYTES ABSOLUTE COUNT: 1.1 10*9/L (ref 0.7–4.0)
LYMPHOCYTES RELATIVE PERCENT: 19.1 %
MEAN CORPUSCULAR HEMOGLOBIN CONC: 31.5 g/dL (ref 30.0–36.0)
MEAN CORPUSCULAR VOLUME: 88.6 fL (ref 81.0–95.0)
MEAN PLATELET VOLUME: 9.9 fL (ref 7.0–10.0)
MONOCYTES ABSOLUTE COUNT: 0.6 10*9/L (ref 0.1–1.0)
MONOCYTES RELATIVE PERCENT: 10.4 %
NEUTROPHILS ABSOLUTE COUNT: 3.8 10*9/L (ref 1.7–7.7)
NEUTROPHILS RELATIVE PERCENT: 66.2 %
NUCLEATED RED BLOOD CELLS: 0 /100{WBCs} (ref <=4)
PLATELET COUNT: 105 10*9/L — ABNORMAL LOW (ref 150–450)
RED BLOOD CELL COUNT: 4.04 10*12/L — ABNORMAL LOW (ref 4.32–5.72)
RED CELL DISTRIBUTION WIDTH: 14.1 % (ref 12.0–15.0)
WBC ADJUSTED: 5.8 10*9/L (ref 3.5–10.5)

## 2020-09-09 LAB — VITAMIN D, TOTAL (25OH): Lab: 15.9 — ABNORMAL LOW

## 2020-09-09 LAB — BLOOD UREA NITROGEN: Urea nitrogen:MCnc:Pt:Ser/Plas:Qn:: 65 — ABNORMAL HIGH

## 2020-09-09 LAB — MEAN CORPUSCULAR VOLUME: Erythrocyte mean corpuscular volume:EntVol:Pt:RBC:Qn:Automated count: 88.6

## 2020-09-09 NOTE — Unmapped (Signed)
Pt reports having 3-4 episodes of blood in stool today. Stool is dark red in color. No other concerns at this time.

## 2020-09-09 NOTE — Unmapped (Signed)
Pt paged on call coordinator.    Returned page, pt states he has had 3 BM today and the last 2 were all blood. Pt denied feeling lightheaded at this time. Pt asking if he should go to [facility] or [place]. Pt checking BP while waiting for return call. Primary TNC made aware, she will call patient back.

## 2020-09-09 NOTE — Unmapped (Signed)
Patient reports he had a hectic morning and starting at 0530 am had a BM with blood in the stool and then two BM with just blood/clots    Otherwise he feels fine. No HA, Dizziness, SOB, fatigue, abdominal pain, n/v/d. Reports BP160/93    He has not taken his AM meds yet. Encouraged him to take his meds and go to ER for evaluation. Daughter will drive him.    Denies any other needs

## 2020-09-09 NOTE — Unmapped (Signed)
[facility] Emergency Department Provider Note      ED Clinical Impression     Final diagnoses:   Blood in stool (Primary)       Initial Impression, Assessment and Plan, and ED Course     69 y.o. male with a PMH of anemia, CHF, CAD, GERD, HTN, HLD, lupus-nephritis, diverticulosis, and right renal cancer presents for sudden onset of dark bloody stools this morning, which have progressed to bright red bloody stools throughout the day. See full HPI below.    VSS on arrival, pt afebrile. Pt well appearing on exam, in NAD, non-toxic.  He is sitting comfortably, very pleasant and conversational, laughing, smiling.  Cardiopulmonary exam is benign.  Abdomen is soft and nontender throughout without any rebound, guarding, or peritoneal signs.  Positive bowel sounds noted.    Differentials include upper GI bleed given reports of prior black stools earlier today versus lower GI bleeding to include diverticulosis, hemorrhoids, anal fissures, mucosal damage from diarrhea, versus malignancy. I am reassured he is very well appearing with normal vital signs and overall benign exam.    Plan for basic labs and rectal exam.    6:34 PM  CMP with elevated BUN of 65 and creatinine of 2.64 with a BUN to creatinine ratio 25.  This seems consistent with his baseline BUN and creatinine levels.  It appears that his BUN is chronically elevated. Hemoglobin today is 11.3, down from 12.04 days ago.    7:03 PM  Rectal exam with partial rectal prolapse which pt states is baseline for him with a very mild amount of gross blood on rectal exam. No notable hemorrhoids or anal fissures. No palpable internal hemorrhoids or masses. Given overall benign exam and no active bleeding as well as stable H&H I do think he is appropriate for discharge. Pt does have an abdominal MRI scheduled in 2 days by his nephrologist. I have spoken with his nephrologist and kidney transplant coordinator. Will have pt f/u in 1-2 days for recheck of CBC. Will also place STAT order for outpatient colonoscopy. Pt is very comfortable with this. We had a long discussion regarding return precautions and he is agreeable. Pt is stable for dc. No hypotension or tachycardia. PCP f/u advised. Strict return precautions have been discussed. Pt is agreeable with this plan and expresses understanding.     History     Chief Complaint  Blood in Stool      HPI   Patient was seen by me at 4:20 PM.    Patient is a 69 y.o. male with a PMH of anemia, CHF, CAD, GERD, HTN, HLD, lupus-nephritis s/p kidney transplant, diverticulosis, and right renal carcinoma who presents to the ED for blood in his stool. The patient reports that at around 5:30 AM this morning he began having black stools. He had 2-3 more episodes of dark stools after the initial episode. Later today, before coming to the ED, he noticed bright red blood in the toilet when he used the restroom. He has had around 6 total episodes fo bloody stools today.  He notes that his stools have been loose throughout the day, and that his most recent BM was mostly bloody water. He has not noticed any clots in the blood. He denies bleeding between bowel movements or pain with bowel movements. He has never had these symptoms before. He denies any recent straining while attempting to pass a BM. Denies recent hemorrhoids. Denies rectal pain. Denies abd pain, n/v, or f/c. He reports  that he is up to date on his colonoscopies. He notes that his father has a history of colon cancer. He states that he takes aspirin, but is not otherwise anticoagulated. He denies sick contacts, recent travel, new medications, or use of antibiotics recently. He notes that he went out to dinner last night, but did not notice anything unsanitary about his food. He denies rectal pain, nausea, vomiting, fevers, chills, abdominal pain, dizziness, lightheadedness, shortness of breath. States he contacted his transplant coordinator who advised he present to the ED.     Previous chart, nursing notes, and vital signs reviewed.      Pertinent labs & imaging results that were available during my care of the patient were reviewed by me and considered in my medical decision making (see chart for details).    Past Medical History:   Diagnosis Date   ??? Anemia    ??? CHF (congestive heart failure) (CMS-HCC)    ??? Coronary artery disease    ??? Disease of thyroid gland    ??? GERD (gastroesophageal reflux disease)    ??? Gout    ??? Hyperlipidemia    ??? Hypertension    ??? Lupus nephritis (CMS-HCC)    ??? NSTEMI (non-ST elevated myocardial infarction) (CMS-HCC)    ??? Red blood cell antibody positive     anti-K   ??? Renal cancer, right (CMS-HCC)    ??? Renal transplant, status post        Past Surgical History:   Procedure Laterality Date   ??? AV FISTULA PLACEMENT Right    ??? HERNIA REPAIR     ??? PR BREATH HYDROGEN TEST N/A 06/24/2016    Procedure: BREATH HYDROGEN TEST;  Surgeon: Nurse-Based Giproc;  [facility];  Service: Gastroenterology   ??? PR COLONOSCOPY FLX DX W/COLLJ SPEC WHEN PFRMD N/A 05/22/2016    Procedure: COLONOSCOPY, FLEXIBLE, PROXIMAL TO SPLENIC FLEXURE; DIAGNOSTIC, W/WO COLLECTION SPECIMEN BY BRUSH OR WASH;  Surgeon: Liane Comber, MD;  Location: [facility];  Service: Gastroenterology   ??? PR LIGATN ANGIOACCESS AV FISTULA Right 10/21/2018    Procedure: LIGATION OR BANDING OF ANGIOACCESS ARTERIOVENOUS FISTULA, UPPER EXTREMITY;  Surgeon: Leona Carry, MD;  Location: MAIN OR [facility];  Service: Transplant   ??? PR NEPHRECTOMY Right 02/20/2014    Procedure: LAPAROSCOPY, SURGICAL; NEPHRECTOMY, INCLUDING PARTIAL URETERECTOMY;  Surgeon: Vivi Barrack, MD;  Location: MAIN OR [facility];  Service: Transplant   ??? PR REVISE AV FISTULA,W/O THROMBECTOMY Right 02/07/2019    Procedure: REVISON, OPEN, ARTERIOVENOUS FISTULA; WITHOUT THROMBECTOMY, AUTOGENOUS OR NONAUTOGENOUS DIALYSIS GRAFT;  Surgeon: Leona Carry, MD;  Location: MAIN OR [facility];  Service: Transplant   ??? TRANSPLANTATION RENAL         No current facility-administered medications for this encounter.    Current Outpatient Medications:   ???  acyclovir (ZOVIRAX) 400 MG tablet, TAKE (1) TABLET BY MOUTH EVERY DAY, Disp: 90 tablet, Rfl: 3  ???  allopurinoL (ZYLOPRIM) 100 MG tablet, Take 1 tablet (100 mg total) by mouth daily., Disp: 30 tablet, Rfl: 11  ???  aspirin (ASPIRIN LOW-STRENGTH) 81 MG chewable tablet, Chew 81 mg daily., Disp: , Rfl:   ???  atorvastatin (LIPITOR) 20 MG tablet, Take 1 tablet (20 mg total) by mouth nightly., Disp: 90 each, Rfl: 3  ???  calcitrioL (ROCALTROL) 0.25 MCG capsule, Take 1 capsule (0.25 mcg total) by mouth every Monday, Wednesday, and Friday at 1600., Disp: 36 capsule, Rfl: 3  ???  CELLCEPT 250 mg capsule, Take  1 capsule (250 mg total) by mouth Two (2) times a day., Disp: 60 capsule, Rfl: 11  ???  colchicine (COLCRYS) 0.6 mg tablet, Take 1 tablet (0.6 mg total) by mouth daily as needed for up to 1 dose., Disp: 30 tablet, Rfl: 1  ???  enalapril (VASOTEC) 5 MG tablet, TAKE (1) TABLET BY MOUTH TWICE DAILY, Disp: 180 tablet, Rfl: 3  ???  ferrous sulfate 325 (65 FE) MG tablet, Take 1 tablet (325 mg total) by mouth daily., Disp: , Rfl: 0  ???  fluticasone (FLONASE) 50 mcg/actuation nasal spray, 1 spray into each nostril daily as needed., Disp: , Rfl:   ???  furosemide (LASIX) 20 MG tablet, Take 40 mg in the morning and 20 mg in the evening., Disp: 270 tablet, Rfl: 3  ???  levothyroxine (SYNTHROID) 200 MCG tablet, TAKE ONE TABLET EVERY DAY WITH (2) FOR A TOTAL DAILY DOSE OF , Disp: 90 tablet, Rfl: 3  ???  levothyroxine (SYNTHROID) 200 MCG tablet, Take 1 tab with two tab, total dose daily, Disp: 90 tablet, Rfl: 3  ???  levothyroxine (SYNTHROID) 25 MCG tablet, TAKE (2) TABLETS BY MOUTH EVERY DAY WITH1 TAB FOR A TOTAL DAILY DOSE OF , Disp: 180 tablet, Rfl: 3  ???  magnesium oxide (MAG-OX) 400 mg (241.3 mg magnesium) tablet, TAKE (1) TABLET BY MOUTH TWICE DAILY, Disp: 180 each, Rfl: 0  ???  metoprolol succinate (TOPROL-XL) 100 MG 24 hr tablet, TAKE (1) TABLET BY MOUTH EVERY DAY, Disp: 90 each, Rfl: 3  ???  omeprazole (PRILOSEC) 20 MG capsule, Take 1 capsule (20 mg total) by mouth daily., Disp: 90 capsule, Rfl: 3  ???  predniSONE (DELTASONE) 5 MG tablet, Take 1 tablet (5 mg total) by mouth daily., Disp: 90 tablet, Rfl: 3  ???  PROGRAF 1 mg capsule, TAKE 3 CAPSULES (3MG ) BY MOUTH IN THE MORNING AND 2 CAPSULES (2MG ) BY MOUTH IN THE EVENING., Disp: 180 capsule, Rfl: 11  ???  simethicone (GAS-X) 80 MG chewable tablet, Chew 1 tablet (80 mg total) every six (6) hours as needed for flatulence., Disp: 30 tablet, Rfl: 1    Allergies  Penicillins    Family History   Problem Relation Age of Onset   ??? Heart disease Mother    ??? Diabetes Mother    ??? Hypertension Mother    ??? Cancer Father    ??? Alcohol abuse Brother    ??? Cardiomyopathy Neg Hx    ??? Sudden Cardiac Death Neg Hx    ??? Melanoma Neg Hx    ??? Basal cell carcinoma Neg Hx    ??? Squamous cell carcinoma Neg Hx    ??? Anesthesia problems Neg Hx        Social History  Social History     Tobacco Use   ??? Smoking status: Never Smoker   ??? Smokeless tobacco: Never Used   Substance Use Topics   ??? Alcohol use: No     Alcohol/week: 0.0 standard drinks   ??? Drug use: No       Review of Systems    Review of Systems   Constitutional: Negative for chills and fever.   HENT: Negative for congestion, rhinorrhea and sore throat.    Respiratory: Negative for cough and shortness of breath.    Cardiovascular: Negative for chest pain.   Gastrointestinal: Positive for blood in stool and diarrhea (loose stools). Negative for abdominal pain, constipation, nausea, rectal pain and vomiting.   Genitourinary: Negative for  decreased urine volume, difficulty urinating, dysuria, flank pain, frequency, hematuria and urgency.   Musculoskeletal: Negative for back pain.   Skin: Negative for rash.   Neurological: Negative for dizziness and light-headedness.   All other systems reviewed and are negative.        Labs     Labs Reviewed   COMPREHENSIVE METABOLIC PANEL - Abnormal; Notable for the following components:       Result Value    Chloride 108 (*)     BUN 65 (*)     Creatinine 2.64 (*)     EGFR CKD-EPI Non-African American, Male 34 (*)     EGFR CKD-EPI African American, Male 23 (*)     All other components within normal limits   CBC W/ AUTO DIFF - Abnormal; Notable for the following components:    RBC 4.04 (*)     HGB 11.3 (*)     HCT 35.8 (*)     Platelet 105 (*)     All other components within normal limits   CBC W/ DIFFERENTIAL    Narrative:     The following orders were created for panel order CBC w/ Differential.  Procedure                               Abnormality         Status                     ---------                               -----------         ------                     CBC w/ Differential[[id number]]         Abnormal            Final result                 Please view results for these tests on the individual orders.       Radiology     No results found.    Physical Exam     VITAL SIGNS:    Vitals:    09/09/20 1335 09/09/20 1740 09/09/20 2003   BP: 141/89 162/86 156/82   Pulse: 73 71 75   Resp: 16 14 16    Temp: 36.9 ??C (98.4 ??F) 36.9 ??C (98.4 ??F)    TempSrc: Oral Oral    SpO2: 100% 99% 100%       Physical Exam  Vitals reviewed.   Constitutional:       General: He is not in acute distress.     Appearance: Normal appearance. He is well-developed. He is not ill-appearing, toxic-appearing or diaphoretic.   HENT:      Head: Normocephalic and atraumatic.   Cardiovascular:      Rate and Rhythm: Normal rate and regular rhythm.      Heart sounds: Normal heart sounds. No murmur heard.   No friction rub. No gallop.    Pulmonary:      Effort: Pulmonary effort is normal. No respiratory distress.      Breath sounds: Normal breath sounds. No stridor. No wheezing, rhonchi or rales.   Abdominal:      General: Bowel sounds are normal. There is no distension.  Palpations: Abdomen is soft. There is no mass.      Tenderness: There is no abdominal tenderness. There is no guarding or rebound.      Hernia: No hernia is present.      Comments: Abdomen is soft and nontender throughout without any rebound, guarding, or peritoneal signs.  Positive bowel sounds noted.   Genitourinary:     Comments: Rectal exam with partial rectal prolapse which pt states is baseline for him with a very mild amount of gross blood on rectal exam. No notable hemorrhoids or anal fissures. No palpable internal hemorrhoids or masses.  Musculoskeletal:         General: Normal range of motion.      Cervical back: Normal range of motion.   Skin:     General: Skin is warm and dry.   Neurological:      Mental Status: He is alert and oriented to person, place, and time.   Psychiatric:         Behavior: Behavior normal.           Documentation assistance was provided by Pecola Leisure, Scribe, on September 09, 2020 at 5:56 PM for Bluford Kaufmann, [place].     Documentation assistance was provided by the scribe in my presence.  The documentation recorded by the scribe has been reviewed by me and accurately reflects the services I personally performed.         Toni Arthurs [place], [place]  09/14/20 1806

## 2020-09-10 DIAGNOSIS — Z940 Kidney transplant status: Principal | ICD-10-CM

## 2020-09-10 LAB — BK VIRUS QUANTITATIVE PCR, BLOOD: BK BLOOD RESULT: NOT DETECTED

## 2020-09-10 LAB — VITAMIN D 1,25 DIHYDROXY: VITAMIN D 1,25-DIHYDROXY: 29 pg/mL

## 2020-09-10 NOTE — Unmapped (Signed)
Called patient to check in after his ER visit yesterday.      Left him VM asking him to repeat blood work tomorrow with his MRI apt. Also gave him strict return precautions to ER and to call with any questions or concerns

## 2020-09-11 DIAGNOSIS — Z940 Kidney transplant status: Principal | ICD-10-CM

## 2020-09-11 DIAGNOSIS — K769 Liver disease, unspecified: Principal | ICD-10-CM

## 2020-09-11 MED ORDER — PEG 3350-ELECTROLYTES 236 GRAM-22.74 GRAM-6.74 GRAM-5.86 GRAM SOLUTION
Freq: Once | ORAL | 0 refills | 1.00000 days | Status: CP
Start: 2020-09-11 — End: 2020-09-12
  Filled 2020-09-11: qty 4000, 1d supply, fill #0

## 2020-09-11 MED FILL — PEG 3350-ELECTROLYTES 236 GRAM-22.74 GRAM-6.74 GRAM-5.86 GRAM SOLUTION: 1 days supply | Qty: 4000 | Fill #0 | Status: AC

## 2020-09-11 MED FILL — PROGRAF 1 MG CAPSULE: 30 days supply | Qty: 150 | Fill #9

## 2020-09-11 MED FILL — PROGRAF 1 MG CAPSULE: 30 days supply | Qty: 150 | Fill #9 | Status: AC

## 2020-09-11 NOTE — Unmapped (Signed)
Patient called and states he had a BM and had blood with it.  Denies any SOB, fatigue, or dizziness.  Has not had anymore BM    Advised patient to go to the ER. Patient states he is feeling fine and is reluctant to go.      Patient agrees that if he has another BM or starts to feel bad he will go to ER.  If he continues to feel fine then he will go get labs     Updated dr true     Denies any other needs

## 2020-09-11 NOTE — Unmapped (Signed)
Called patient to check in.     Updated him on need to change MRI date due to insurance, verbalized understanding.    Will repeat labs tomorrow. Denies any more bleeding with BM    Colonoscopy next Friday    Updated Dr Carlene Coria

## 2020-09-12 ENCOUNTER — Other Ambulatory Visit
Admission: RE | Admit: 2020-09-12 | Discharge: 2020-09-12 | Disposition: A | Discharge status: Home / Self Care | Payer: Medicare HMO | Attending: Nephrology | Admitting: Nephrology

## 2020-09-12 ENCOUNTER — Other Ambulatory Visit: Payer: Self-pay

## 2020-09-12 ENCOUNTER — Ambulatory Visit: Admit: 2020-09-12 | Discharge: 2020-09-13 | Payer: MEDICARE

## 2020-09-12 DIAGNOSIS — Z940 Kidney transplant status: Secondary | ICD-10-CM | POA: Insufficient documentation

## 2020-09-12 DIAGNOSIS — Z789 Other specified health status: Secondary | ICD-10-CM | POA: Insufficient documentation

## 2020-09-12 DIAGNOSIS — D899 Disorder involving the immune mechanism, unspecified: Secondary | ICD-10-CM | POA: Diagnosis not present

## 2020-09-12 DIAGNOSIS — Z9483 Pancreas transplant status: Secondary | ICD-10-CM | POA: Insufficient documentation

## 2020-09-12 DIAGNOSIS — E1129 Type 2 diabetes mellitus with other diabetic kidney complication: Secondary | ICD-10-CM | POA: Insufficient documentation

## 2020-09-12 DIAGNOSIS — Z79899 Other long term (current) drug therapy: Secondary | ICD-10-CM | POA: Insufficient documentation

## 2020-09-12 DIAGNOSIS — Z09 Encounter for follow-up examination after completed treatment for conditions other than malignant neoplasm: Secondary | ICD-10-CM | POA: Diagnosis not present

## 2020-09-12 DIAGNOSIS — E559 Vitamin D deficiency, unspecified: Secondary | ICD-10-CM | POA: Diagnosis not present

## 2020-09-12 DIAGNOSIS — D631 Anemia in chronic kidney disease: Secondary | ICD-10-CM | POA: Insufficient documentation

## 2020-09-12 DIAGNOSIS — N390 Urinary tract infection, site not specified: Secondary | ICD-10-CM | POA: Insufficient documentation

## 2020-09-12 DIAGNOSIS — Z114 Encounter for screening for human immunodeficiency virus [HIV]: Secondary | ICD-10-CM | POA: Diagnosis not present

## 2020-09-12 DIAGNOSIS — B259 Cytomegaloviral disease, unspecified: Secondary | ICD-10-CM | POA: Diagnosis present

## 2020-09-12 LAB — BASIC METABOLIC PANEL
Anion gap: 9 (ref 5–15)
BUN: 79 mg/dL — ABNORMAL HIGH (ref 8–23)
CO2: 24 mmol/L (ref 22–32)
Calcium: 9.2 mg/dL (ref 8.9–10.3)
Chloride: 107 mmol/L (ref 98–111)
Creatinine, Ser: 3.24 mg/dL — ABNORMAL HIGH (ref 0.61–1.24)
GFR calc Af Amer: 22 mL/min — ABNORMAL LOW (ref >60)
GFR calc non Af Amer: 19 mL/min — ABNORMAL LOW (ref >60)
Glucose, Bld: 97 mg/dL (ref 70–99)
Potassium: 4.2 mmol/L (ref 3.5–5.1)
Sodium: 140 mmol/L (ref 135–145)

## 2020-09-12 LAB — MAGNESIUM: Magnesium: 1.8 mg/dL (ref 1.7–2.4)

## 2020-09-12 LAB — CBC WITH DIFFERENTIAL/PLATELET
Abs Immature Granulocytes: 0.03 10*3/uL (ref 0.00–0.07)
Basophils Absolute: 0 10*3/uL (ref 0.0–0.1)
Basophils Relative: 0 %
Eosinophils Absolute: 0.2 10*3/uL (ref 0.0–0.5)
Eosinophils Relative: 4 %
HCT: 29.1 % — ABNORMAL LOW (ref 39.0–52.0)
Hemoglobin: 8.9 g/dL — ABNORMAL LOW (ref 13.0–17.0)
Immature Granulocytes: 1 %
Lymphocytes Relative: 23 %
Lymphs Abs: 1.3 10*3/uL (ref 0.7–4.0)
MCH: 27.6 pg (ref 26.0–34.0)
MCHC: 30.6 g/dL (ref 30.0–36.0)
MCV: 90.1 fL (ref 80.0–100.0)
Monocytes Absolute: 0.6 10*3/uL (ref 0.1–1.0)
Monocytes Relative: 10 %
Neutro Abs: 3.6 10*3/uL (ref 1.7–7.7)
Neutrophils Relative %: 62 %
Platelets: 96 10*3/uL — ABNORMAL LOW (ref 150–400)
RBC: 3.23 MIL/uL — ABNORMAL LOW (ref 4.22–5.81)
RDW: 13.7 % (ref 11.5–15.5)
WBC: 5.7 10*3/uL (ref 4.0–10.5)
nRBC: 0 % (ref 0.0–0.2)

## 2020-09-12 LAB — PHOSPHORUS: Phosphorus: 3.2 mg/dL (ref 2.5–4.6)

## 2020-09-13 LAB — HLA DS POST TRANSPLANT
ANTI-DONOR DRW #1 MFI: 3 MFI
ANTI-DONOR HLA-A #1 MFI: 0 MFI
ANTI-DONOR HLA-A #2 MFI: 0 MFI
ANTI-DONOR HLA-B #1 MFI: 0 MFI
ANTI-DONOR HLA-B #2 MFI: 0 MFI
ANTI-DONOR HLA-DQB #1 MFI: 0 MFI
ANTI-DONOR HLA-DQB #2 MFI: 0 MFI
ANTI-DONOR HLA-DR #1 MFI: 82 MFI
ANTI-DONOR HLA-DR #2 MFI: 0 MFI

## 2020-09-13 LAB — HLA CLASS 1 ANTIBODY RESULT: Lab: NEGATIVE

## 2020-09-13 LAB — HLA CL2 AB COMMENT: Lab: 0

## 2020-09-13 LAB — ANTI-DONOR DRW #1 MFI: Lab: 3

## 2020-09-13 LAB — FSAB CLASS 1 ANTIBODY SPECIFICITY

## 2020-09-16 LAB — EOSINOPHILS ABSOLUTE COUNT: Lab: 0.2

## 2020-09-16 LAB — CBC W/ DIFFERENTIAL
BASOPHILS ABSOLUTE COUNT: 0 10*9/L
BASOPHILS RELATIVE PERCENT: 0 %
EOSINOPHILS ABSOLUTE COUNT: 0.2 10*9/L
EOSINOPHILS RELATIVE PERCENT: 4 %
HEMATOCRIT: 29.1 % — ABNORMAL LOW
HEMOGLOBIN: 8.9 g/dL — ABNORMAL LOW
LYMPHOCYTES ABSOLUTE COUNT: 1.3 10*9/L
LYMPHOCYTES RELATIVE PERCENT: 23 %
MEAN CORPUSCULAR HEMOGLOBIN: 27.6 pg
MEAN CORPUSCULAR VOLUME: 90.1 fL
MONOCYTES ABSOLUTE COUNT: 0.6 10*9/L
MONOCYTES RELATIVE PERCENT: 10 %
NEUTROPHILS ABSOLUTE COUNT: 3.6 10*9/L
NEUTROPHILS RELATIVE PERCENT: 62 %
PLATELET COUNT: 96 10*9/L — ABNORMAL LOW
RED CELL DISTRIBUTION WIDTH: 13.7 %

## 2020-09-16 LAB — BASIC METABOLIC PANEL
CALCIUM: 9.2 mg/dL
CHLORIDE: 107 mmol/L
CO2: 24 mmol/L
CREATININE: 3.24 mg/dL — ABNORMAL HIGH
EGFR CKD-EPI AA FEMALE: 22 mL/min/{1.73_m2} — ABNORMAL LOW
GLUCOSE RANDOM: 97 mg/dL
SODIUM: 140 mmol/L

## 2020-09-16 LAB — PHOSPHORUS: Lab: 3.2

## 2020-09-16 LAB — EGFR CKD-EPI NON-AA FEMALE: Lab: 19 — ABNORMAL LOW

## 2020-09-16 LAB — MAGNESIUM: Lab: 1.8

## 2020-09-17 LAB — TACROLIMUS, TROUGH: Lab: 4.6 — ABNORMAL LOW

## 2020-09-17 MED ORDER — METOPROLOL SUCCINATE ER 100 MG TABLET,EXTENDED RELEASE 24 HR
ORAL_TABLET | Freq: Every day | ORAL | 3 refills | 90 days | Status: CP
Start: 2020-09-17 — End: ?

## 2020-09-18 NOTE — Unmapped (Signed)
9/23 started with cough. Having coughing fits at times.   Has received 3 doses of covid vaccine- got booster dose recently.     Advised that due to cough, the safest course of action would be to delay the procedure for now. Advised that we want to keep patients as safe as possible.   Patient ok with this plan.     Transferred to scheduling to change appointment for procedure.

## 2020-09-24 ENCOUNTER — Other Ambulatory Visit: Payer: Self-pay

## 2020-09-24 ENCOUNTER — Ambulatory Visit (INDEPENDENT_AMBULATORY_CARE_PROVIDER_SITE_OTHER): Payer: Medicare HMO

## 2020-09-24 ENCOUNTER — Ambulatory Visit
Admission: EM | Admit: 2020-09-24 | Discharge: 2020-09-24 | Disposition: A | Discharge status: Home / Self Care | Payer: Medicare HMO | Attending: Family Medicine | Admitting: Family Medicine

## 2020-09-24 ENCOUNTER — Encounter: Payer: Self-pay | Admitting: Emergency Medicine

## 2020-09-24 DIAGNOSIS — Z7952 Long term (current) use of systemic steroids: Secondary | ICD-10-CM | POA: Diagnosis not present

## 2020-09-24 DIAGNOSIS — I10 Essential (primary) hypertension: Secondary | ICD-10-CM | POA: Diagnosis not present

## 2020-09-24 DIAGNOSIS — J209 Acute bronchitis, unspecified: Secondary | ICD-10-CM

## 2020-09-24 DIAGNOSIS — R509 Fever, unspecified: Secondary | ICD-10-CM | POA: Diagnosis not present

## 2020-09-24 DIAGNOSIS — Z880 Allergy status to penicillin: Secondary | ICD-10-CM | POA: Diagnosis not present

## 2020-09-24 DIAGNOSIS — Z79899 Other long term (current) drug therapy: Secondary | ICD-10-CM | POA: Insufficient documentation

## 2020-09-24 DIAGNOSIS — Z20822 Contact with and (suspected) exposure to covid-19: Secondary | ICD-10-CM | POA: Diagnosis not present

## 2020-09-24 DIAGNOSIS — Z7982 Long term (current) use of aspirin: Secondary | ICD-10-CM | POA: Insufficient documentation

## 2020-09-24 DIAGNOSIS — E079 Disorder of thyroid, unspecified: Secondary | ICD-10-CM | POA: Insufficient documentation

## 2020-09-24 DIAGNOSIS — Z940 Kidney transplant status: Secondary | ICD-10-CM | POA: Diagnosis not present

## 2020-09-24 DIAGNOSIS — Z79890 Hormone replacement therapy (postmenopausal): Secondary | ICD-10-CM | POA: Insufficient documentation

## 2020-09-24 DIAGNOSIS — R0981 Nasal congestion: Secondary | ICD-10-CM

## 2020-09-24 DIAGNOSIS — Z8249 Family history of ischemic heart disease and other diseases of the circulatory system: Secondary | ICD-10-CM | POA: Diagnosis not present

## 2020-09-24 DIAGNOSIS — R059 Cough, unspecified: Secondary | ICD-10-CM | POA: Diagnosis present

## 2020-09-24 LAB — SARS CORONAVIRUS 2 (TAT 6-24 HRS): SARS Coronavirus 2: NEGATIVE

## 2020-09-24 MED ORDER — HYDROCOD POLST-CPM POLST ER 10-8 MG/5ML PO SUER
5.0000 mL | Freq: Every evening | ORAL | 0 refills | Status: DC | PRN
Start: 2020-09-24 — End: 2020-10-28

## 2020-09-24 MED ORDER — DOXYCYCLINE HYCLATE 100 MG PO CAPS: 100.0000 mg | ORAL_CAPSULE | Freq: Two times a day (BID) | ORAL | 0 refills | Status: DC

## 2020-09-24 NOTE — Unmapped (Signed)
Called patient to check in after he cancelled colonoscopy. Says he developed a cough and they felt like they should cancel due to cough. He says that he has not had anymore rectal bleeding. He states that he was not eating and drinking prior to last set of labs    Reports that he still has a cough and he went to UC today and was diagnosed with bronchitis and was started on an antibiotic and cough medicine. He will pick up doxycycline and cough medication (tussinex) today.    Asked patient to repeat labs so we can recheck hgb/hct and increased creatinine.  He will go Thursday    Instructed him to call if he starts to feel any worse or has any other concerns, to drink more fluids, eat a bland diet, and take his medications as prescribed.    Denies any other needs

## 2020-09-24 NOTE — ED Provider Notes (Signed)
[facility]    CSN: [id number] Arrival date & time: 09/24/20  1006  History   Chief Complaint Chief Complaint  Patient presents with  . Cough  . Nasal Congestion   HPI  69 year old male presents with cough.  2-week history of cough.  Worse at night.  He states that he is otherwise feeling well.  No fever.  No reported sick contacts.  He has been taking Robitussin without resolution.  No other associated symptoms.  No other complaints.  Past Medical History:  Diagnosis Date  . Hypertension   . Kidney dialysis status    in 2000s stopped in 2008 after transplant   . Kidney transplanted 2008  . Renal disorder   . Thyroid disease    Past Surgical History:  Procedure Laterality Date  . NEPHRECTOMY TRANSPLANTED ORGAN    . R arm fistula     Home Medications    Prior to Admission medications   Medication Sig Start Date End Date Taking? Authorizing Provider  allopurinol (ZYLOPRIM) 100 MG tablet Take 150 mg by mouth daily.   Yes [provider]  aspirin 81 MG tablet Take 81 mg by mouth daily.   Yes [provider]  atorvastatin (LIPITOR) 20 MG tablet Take 20 mg by mouth daily.   Yes [provider]  calcitRIOL (ROCALTROL) 0.25 MCG capsule Take 0.25 mcg by mouth 3 (three) times a week.   Yes [provider]  carboxymethylcellulose 1 % ophthalmic solution Apply 1 drop to eye 3 (three) times daily. 08/02/15  Yes Betancourt, Aura Fey, NP  enalapril (VASOTEC) 5 MG tablet Take 5 mg by mouth 2 (two) times daily.   Yes [provider]  ferrous sulfate 325 (65 FE) MG EC tablet Take 325 mg by mouth 3 (three) times daily with meals.   Yes [provider]  fluticasone (FLONASE) 50 MCG/ACT nasal spray Place 1 spray into both nostrils 2 (two) times daily. 08/02/15  Yes Betancourt, Aura Fey, NP  furosemide (LASIX) 20 MG tablet Take 20 mg by mouth.   Yes [provider]  levothyroxine (SYNTHROID, LEVOTHROID) 200 MCG tablet Take  200 mcg by mouth daily before breakfast.   Yes [provider]  metoprolol succinate (TOPROL-XL) 100 MG 24 hr tablet Take 100 mg by mouth daily. Take with or immediately following a meal.   Yes [provider]  mycophenolate (CELLCEPT) 500 MG tablet Take by mouth 2 (two) times daily.   Yes [provider]  omeprazole (PRILOSEC) 40 MG capsule Take 20 mg by mouth daily.    Yes [provider]  predniSONE (DELTASONE) 5 MG tablet Take 5 mg by mouth daily with breakfast.   Yes [provider]  sodium chloride (OCEAN) 0.65 % SOLN nasal spray Place 2 sprays into both nostrils every 2 (two) hours while awake. 08/02/15  Yes Betancourt, Aura Fey, NP  tacrolimus (PROGRAF) 1 MG capsule Take 3 mg by mouth 2 (two) times daily.   Yes [provider]  chlorpheniramine-HYDROcodone (TUSSIONEX PENNKINETIC ER) 10-8 MG/5ML SUER Take 5 mLs by mouth at bedtime as needed for cough. 09/24/20   Coral Spikes, DO  doxycycline (VIBRAMYCIN) 100 MG capsule Take 1 capsule (100 mg total) by mouth 2 (two) times daily. 09/24/20   Coral Spikes, DO    Family History Family History  Problem Relation Age of Onset  . Diabetes Mother   . Vasculitis Mother   . Hypertension Mother   . Diabetes Father   .  Cancer Father     Social History Social History   Tobacco Use  . Smoking status: Never Smoker  . Smokeless tobacco: Never Used  Vaping Use  . Vaping Use: Never used  Substance Use Topics  . Alcohol use: No  . Drug use: No     Allergies   Penicillins   Review of Systems Review of Systems  Constitutional: Negative for fever.  Respiratory: Positive for cough.    Physical Exam Triage Vital Signs ED Triage Vitals  Enc Vitals Group     BP 09/24/20 1055 (!) 169/89     Pulse Rate 09/24/20 1055 65     Resp 09/24/20 1055 18     Temp 09/24/20 1055 98.3 F (36.8 C)     Temp Source 09/24/20 1055 Oral     SpO2 09/24/20 1055 99 %     Weight 09/24/20 1050 167 lb (75.8  kg)     Height 09/24/20 1050 6' (1.829 m)     Head Circumference --      Peak Flow --      Pain Score 09/24/20 1050 0     Pain Loc --      Pain Edu? --      Excl. in [place]? --    Updated Vital Signs BP (!) 169/89 (BP Location: Right Arm)   Pulse 65   Temp 98.3 F (36.8 C) (Oral)   Resp 18   Ht 6' (1.829 m)   Wt 75.8 kg   SpO2 99%   BMI 22.65 kg/m   Visual Acuity Right Eye Distance:   Left Eye Distance:   Bilateral Distance:    Right Eye Near:   Left Eye Near:    Bilateral Near:     Physical Exam Vitals and nursing note reviewed.  Constitutional:      General: He is not in acute distress.    Appearance: Normal appearance. He is not ill-appearing.  HENT:     Head: Normocephalic and atraumatic.  Cardiovascular:     Rate and Rhythm: Normal rate and regular rhythm.  Pulmonary:     Effort: Pulmonary effort is normal.     Breath sounds: Normal breath sounds. No wheezing or rales.  Neurological:     Mental Status: He is alert.  Psychiatric:        Mood and Affect: Mood normal.        Behavior: Behavior normal.    UC Treatments / Results  Labs (all labs ordered are listed, but only abnormal results are displayed) Labs Reviewed  SARS CORONAVIRUS 2 (TAT 6-24 HRS)    EKG   Radiology DG Chest 2 View  Result Date: 09/24/2020 CLINICAL DATA:  Cough and congestion EXAM: CHEST - 2 VIEW COMPARISON:  February 11, 2018 FINDINGS: Lungs are clear. The heart is upper normal in size with pulmonary vascularity normal. No adenopathy. There is mild degenerative change in the thoracic spine. IMPRESSION: Lungs clear.  Heart upper normal in size.  No evident adenopathy. Electronically Signed   By: Lowella Grip III M.D.   On: 09/24/2020 11:26    Procedures Procedures (including critical care time)  Medications Ordered in UC Medications - No data to display  Initial Impression / Assessment and Plan / UC Course  I have reviewed the triage vital signs and the nursing  notes.  Pertinent labs & imaging results that were available during my care of the patient were reviewed by me and considered in my medical decision making (see chart  for details).    69 year old male presents with acute bronchitis.  Given lack of improvement and persistent symptoms, chest x-ray was obtained.  Chest x-ray was independently reviewed.  No acute infiltrate.  Treating with doxycycline and Tussionex.  Final Clinical Impressions(s) / UC Diagnoses   Final diagnoses:  Acute bronchitis, unspecified organism   Discharge Instructions   None    ED Prescriptions    Medication Sig Dispense Auth. Provider   doxycycline (VIBRAMYCIN) 100 MG capsule Take 1 capsule (100 mg total) by mouth 2 (two) times daily. 14 capsule [place], [place] G, DO   chlorpheniramine-HYDROcodone (TUSSIONEX PENNKINETIC ER) 10-8 MG/5ML SUER Take 5 mLs by mouth at bedtime as needed for cough. 60 mL Coral Spikes, DO     PDMP not reviewed this encounter.   Coral Spikes, DO 09/24/20 1213

## 2020-09-24 NOTE — ED Triage Notes (Signed)
Patient c/o cough and nasal congestion x 2 weeks.

## 2020-10-02 NOTE — Unmapped (Signed)
Called patient to follow up since he has not repeated his labs    Patient reports he will go in the AM. States he is feeling good, denies any more bleeding with BM    Updated Dr Carlene Coria

## 2020-10-03 ENCOUNTER — Other Ambulatory Visit: Payer: Self-pay

## 2020-10-03 ENCOUNTER — Other Ambulatory Visit
Admission: RE | Admit: 2020-10-03 | Discharge: 2020-10-03 | Disposition: A | Discharge status: Home / Self Care | Payer: Medicare HMO | Attending: Nephrology | Admitting: Nephrology

## 2020-10-03 DIAGNOSIS — N390 Urinary tract infection, site not specified: Secondary | ICD-10-CM | POA: Insufficient documentation

## 2020-10-03 DIAGNOSIS — N189 Chronic kidney disease, unspecified: Secondary | ICD-10-CM | POA: Insufficient documentation

## 2020-10-03 DIAGNOSIS — E1129 Type 2 diabetes mellitus with other diabetic kidney complication: Secondary | ICD-10-CM | POA: Diagnosis not present

## 2020-10-03 DIAGNOSIS — D631 Anemia in chronic kidney disease: Secondary | ICD-10-CM | POA: Insufficient documentation

## 2020-10-03 DIAGNOSIS — E559 Vitamin D deficiency, unspecified: Secondary | ICD-10-CM | POA: Insufficient documentation

## 2020-10-03 DIAGNOSIS — Z79899 Other long term (current) drug therapy: Secondary | ICD-10-CM | POA: Diagnosis not present

## 2020-10-03 DIAGNOSIS — B259 Cytomegaloviral disease, unspecified: Secondary | ICD-10-CM | POA: Diagnosis not present

## 2020-10-03 DIAGNOSIS — Z9483 Pancreas transplant status: Secondary | ICD-10-CM | POA: Diagnosis not present

## 2020-10-03 DIAGNOSIS — Z940 Kidney transplant status: Secondary | ICD-10-CM | POA: Insufficient documentation

## 2020-10-03 DIAGNOSIS — D899 Disorder involving the immune mechanism, unspecified: Secondary | ICD-10-CM | POA: Diagnosis present

## 2020-10-03 LAB — CBC WITH DIFFERENTIAL/PLATELET
Abs Immature Granulocytes: 0.06 10*3/uL (ref 0.00–0.07)
Basophils Absolute: 0 10*3/uL (ref 0.0–0.1)
Basophils Relative: 0 %
Eosinophils Absolute: 0.1 10*3/uL (ref 0.0–0.5)
Eosinophils Relative: 2 %
HCT: 33.1 % — ABNORMAL LOW (ref 39.0–52.0)
Hemoglobin: 9.7 g/dL — ABNORMAL LOW (ref 13.0–17.0)
Immature Granulocytes: 1 %
Lymphocytes Relative: 20 %
Lymphs Abs: 1.3 10*3/uL (ref 0.7–4.0)
MCH: 26.9 pg (ref 26.0–34.0)
MCHC: 29.3 g/dL — ABNORMAL LOW (ref 30.0–36.0)
MCV: 91.7 fL (ref 80.0–100.0)
Monocytes Absolute: 0.6 10*3/uL (ref 0.1–1.0)
Monocytes Relative: 9 %
Neutro Abs: 4.6 10*3/uL (ref 1.7–7.7)
Neutrophils Relative %: 68 %
Platelets: 129 10*3/uL — ABNORMAL LOW (ref 150–400)
RBC: 3.61 MIL/uL — ABNORMAL LOW (ref 4.22–5.81)
RDW: 13.7 % (ref 11.5–15.5)
WBC: 6.7 10*3/uL (ref 4.0–10.5)
nRBC: 0 % (ref 0.0–0.2)

## 2020-10-03 LAB — BASIC METABOLIC PANEL
Anion gap: 9 (ref 5–15)
BUN: 67 mg/dL — ABNORMAL HIGH (ref 8–23)
CO2: 26 mmol/L (ref 22–32)
Calcium: 9.2 mg/dL (ref 8.9–10.3)
Chloride: 103 mmol/L (ref 98–111)
Creatinine, Ser: 2.85 mg/dL — ABNORMAL HIGH (ref 0.61–1.24)
GFR, Estimated: 23 mL/min — ABNORMAL LOW (ref >60)
Glucose, Bld: 99 mg/dL (ref 70–99)
Potassium: 4.2 mmol/L (ref 3.5–5.1)
Sodium: 138 mmol/L (ref 135–145)

## 2020-10-03 LAB — PHOSPHORUS: Phosphorus: 3.1 mg/dL (ref 2.5–4.6)

## 2020-10-03 LAB — MAGNESIUM: Magnesium: 2 mg/dL (ref 1.7–2.4)

## 2020-10-03 NOTE — Unmapped (Signed)
[facility] Refill Coordination Note    Specialty Medication(s) to be Shipped:   Transplant: Prograf 1mg     Other medication(s) to be shipped: No additional medications requested for fill at this time     Miguel Ward, DOB: 01/21/1951  Phone: [phone] (home)       All above HIPAA information was verified with patient.     Was a Nurse, learning disability used for this call? No    Completed refill call assessment today to schedule patient's medication shipment from the [facility] [phone]).       Specialty medication(s) and dose(s) confirmed: Regimen is correct and unchanged.   Changes to medications: [place] reports no changes at this time.  Changes to insurance: No  Questions for the pharmacist: No    Confirmed patient received Welcome Packet with first shipment. The patient will receive a drug information handout for each medication shipped and additional FDA Medication Guides as required.       DISEASE/MEDICATION-SPECIFIC INFORMATION        N/A    SPECIALTY MEDICATION ADHERENCE     Medication Adherence    Patient reported X missed doses in the last month: 0  Adherence tools used: patient uses a pill box to manage medications, calendar            PROGRAF 1mg : 10 days worth of medication on hand.        SHIPPING     Shipping address confirmed in Epic.     Delivery Scheduled: Yes, Expected medication delivery date: 10/09/20.     Medication will be delivered via UPS to the prescription address in Epic WAM.    [name]   [facility] Specialty Technician

## 2020-10-07 ENCOUNTER — Ambulatory Visit: Admit: 2020-10-07 | Discharge: 2020-10-08 | Payer: MEDICARE

## 2020-10-08 ENCOUNTER — Ambulatory Visit: Admit: 2020-10-08 | Discharge: 2020-10-09 | Payer: MEDICARE

## 2020-10-08 LAB — TACROLIMUS, TROUGH: Lab: 5.1

## 2020-10-08 MED ADMIN — gadobenate dimeglumine (MULTIHANCE) 529 mg/mL (0.1mmol/0.2mL) solution 8 mL: 8 mL | INTRAVENOUS | @ 14:00:00 | Stop: 2020-10-08

## 2020-10-08 MED FILL — PROGRAF 1 MG CAPSULE: 30 days supply | Qty: 150 | Fill #10 | Status: AC

## 2020-10-08 MED FILL — PROGRAF 1 MG CAPSULE: 30 days supply | Qty: 150 | Fill #10

## 2020-10-28 ENCOUNTER — Ambulatory Visit
Admission: EM | Admit: 2020-10-28 | Discharge: 2020-10-28 | Disposition: A | Discharge status: Home / Self Care | Payer: Medicare HMO | Attending: Emergency Medicine | Admitting: Emergency Medicine

## 2020-10-28 ENCOUNTER — Other Ambulatory Visit: Payer: Self-pay

## 2020-10-28 DIAGNOSIS — R059 Cough, unspecified: Secondary | ICD-10-CM

## 2020-10-28 MED ORDER — BENZONATATE 100 MG PO CAPS: 200.0000 mg | ORAL_CAPSULE | Freq: Three times a day (TID) | ORAL | 0 refills | Status: AC

## 2020-10-28 MED ORDER — PROMETHAZINE-DM 6.25-15 MG/5ML PO SYRP: 5.0000 mL | ORAL_SOLUTION | Freq: Four times a day (QID) | ORAL | 0 refills | Status: AC | PRN

## 2020-10-28 NOTE — ED Triage Notes (Signed)
Pt presents to Urgent Care with c/o persistent cough. Reports that he was seen here on 09/24/20 and was given antibiotic and cough medication. Pt reports cough improved after two weeks, but started worsening again approx 6 days ago. Pt coughing up greenish sputum. Afebrile.

## 2020-10-28 NOTE — Discharge Instructions (Addendum)
Perform sinus irrigation twice daily with distilled water and a NeilMed sinus rinse kit.  Use the Tessalon Perles every 8 hours during the day and the Promethazine DM at nighttime for cough and congestion.  Take Claritin, Zyrtec, or Allegra daily to help with postnasal drip and runny nose.  If your symptoms worsen return for reevaluation or follow-up with your primary care provider.

## 2020-10-28 NOTE — ED Provider Notes (Signed)
[facility]    CSN: [id number] Arrival date & time: 10/28/20  1415      History   Chief Complaint Chief Complaint  Patient presents with  . Cough    HPI Clayton Larsen is a 69 y.o. male.   69 year old male here for evaluation of cough.  Patient reports that he was evaluated on September 24, 2020, diagnosed with bronchitis, given some cough medicine and antibiotics and he improved.  He developed an intermittent cough 6 days ago after performing some leaf blowing in his yard without wearing a mask.  He reports that his symptoms started with a scratchy throat and a runny nose.  Now he has a cough intermittently throughout the day and it increases at night when he lays down.  Patient denies sore throat, shortness of breath, wheezing, ear pressure, sinus pressure, or fever.     Past Medical History:  Diagnosis Date  . Hypertension   . Kidney dialysis status    in 2000s stopped in 2008 after transplant   . Kidney transplanted 2008  . Renal disorder   . Thyroid disease     There are no problems to display for this patient.   Past Surgical History:  Procedure Laterality Date  . NEPHRECTOMY TRANSPLANTED ORGAN    . R arm fistula         Home Medications    Prior to Admission medications   Medication Sig Start Date End Date Taking? Authorizing Provider  allopurinol (ZYLOPRIM) 100 MG tablet Take 150 mg by mouth daily.    [provider]  aspirin 81 MG tablet Take 81 mg by mouth daily.    [provider]  atorvastatin (LIPITOR) 20 MG tablet Take 20 mg by mouth daily.    [provider]  benzonatate (TESSALON) 100 MG capsule Take 2 capsules (200 mg total) by mouth every 8 (eight) hours. 10/28/20   Margarette Canada, NP  calcitRIOL (ROCALTROL) 0.25 MCG capsule Take 0.25 mcg by mouth 3 (three) times a week.    [provider]  carboxymethylcellulose 1 % ophthalmic solution Apply 1 drop to eye 3 (three) times daily. 08/02/15    Betancourt, Aura Fey, NP  enalapril (VASOTEC) 5 MG tablet Take 5 mg by mouth 2 (two) times daily.    [provider]  ferrous sulfate 325 (65 FE) MG EC tablet Take 325 mg by mouth 3 (three) times daily with meals.    [provider]  fluticasone (FLONASE) 50 MCG/ACT nasal spray Place 1 spray into both nostrils 2 (two) times daily. 08/02/15   Betancourt, Aura Fey, NP  furosemide (LASIX) 20 MG tablet Take 20 mg by mouth.    [provider]  levothyroxine (SYNTHROID, LEVOTHROID) 200 MCG tablet Take 200 mcg by mouth daily before breakfast.    [provider]  metoprolol succinate (TOPROL-XL) 100 MG 24 hr tablet Take 100 mg by mouth daily. Take with or immediately following a meal.    [provider]  mycophenolate (CELLCEPT) 500 MG tablet Take by mouth 2 (two) times daily.    [provider]  omeprazole (PRILOSEC) 40 MG capsule Take 20 mg by mouth daily.     [provider]  predniSONE (DELTASONE) 5 MG tablet Take 5 mg by mouth daily with breakfast.    [provider]  promethazine-dextromethorphan (PROMETHAZINE-DM) 6.25-15 MG/5ML syrup Take 5 mLs by mouth 4 (four) times daily as needed. 10/28/20   Margarette Canada, NP  sodium chloride (OCEAN) 0.65 %  SOLN nasal spray Place 2 sprays into both nostrils every 2 (two) hours while awake. 08/02/15   Betancourt, Aura Fey, NP  tacrolimus (PROGRAF) 1 MG capsule Take 3 mg by mouth 2 (two) times daily.    [provider]    Family History Family History  Problem Relation Age of Onset  . Diabetes Mother   . Vasculitis Mother   . Hypertension Mother   . Diabetes Father   . Cancer Father     Social History Social History   Tobacco Use  . Smoking status: Never Smoker  . Smokeless tobacco: Never Used  Vaping Use  . Vaping Use: Never used  Substance Use Topics  . Alcohol use: No  . Drug use: No     Allergies   Penicillins   Review of Systems Review of Systems    Constitutional: Negative for activity change, appetite change and fever.  HENT: Positive for rhinorrhea and sore throat. Negative for congestion and ear pain.   Respiratory: Positive for cough. Negative for wheezing.   Cardiovascular: Negative for chest pain.  Gastrointestinal: Negative for diarrhea, nausea and vomiting.  Musculoskeletal: Negative for arthralgias and myalgias.  Skin: Negative for rash.  Neurological: Negative for syncope and headaches.  Hematological: Negative.   Psychiatric/Behavioral: Negative.      Physical Exam Triage Vital Signs ED Triage Vitals  Enc Vitals Group     BP 10/28/20 1600 (!) 150/82     Pulse Rate 10/28/20 1600 70     Resp 10/28/20 1600 16     Temp 10/28/20 1600 98.4 F (36.9 C)     Temp Source 10/28/20 1600 Oral     SpO2 10/28/20 1600 99 %     Weight 10/28/20 1558 167 lb (75.8 kg)     Height 10/28/20 1558 6' (1.829 m)     Head Circumference --      Peak Flow --      Pain Score 10/28/20 1558 0     Pain Loc --      Pain Edu? --      Excl. in [place]? --    No data found.  Updated Vital Signs BP (!) 150/82 (BP Location: Left Arm)   Pulse 70   Temp 98.4 F (36.9 C) (Oral)   Resp 16   Ht 6' (1.829 m)   Wt 167 lb (75.8 kg)   SpO2 99%   BMI 22.65 kg/m   Visual Acuity Right Eye Distance:   Left Eye Distance:   Bilateral Distance:    Right Eye Near:   Left Eye Near:    Bilateral Near:     Physical Exam Vitals and nursing note reviewed.  Constitutional:      General: He is not in acute distress.    Appearance: Normal appearance. He is normal weight.  HENT:     Head: Normocephalic and atraumatic.     Right Ear: Tympanic membrane, ear canal and external ear normal. There is no impacted cerumen.     Left Ear: Tympanic membrane, ear canal and external ear normal. There is no impacted cerumen.     Nose: Congestion and rhinorrhea present.     Comments: Nasal mucosa is erythematous and mildly edematous with clear nasal discharge.     Mouth/Throat:     Mouth: Mucous membranes are moist.     Pharynx: Oropharynx is clear. Posterior oropharyngeal erythema present. No oropharyngeal exudate.     Comments: Posterior oropharynx has mild erythema with clear postnasal drip. Eyes:  General: No scleral icterus.    Extraocular Movements: Extraocular movements intact.     Conjunctiva/sclera: Conjunctivae normal.     Pupils: Pupils are equal, round, and reactive to light.  Cardiovascular:     Rate and Rhythm: Normal rate and regular rhythm.     Pulses: Normal pulses.     Heart sounds: Normal heart sounds. No murmur heard.  No gallop.   Pulmonary:     Effort: Pulmonary effort is normal.     Breath sounds: Rhonchi present. No wheezing or rales.     Comments: Patient has scattered rhonchi. Musculoskeletal:        General: No swelling, tenderness or deformity. Normal range of motion.     Cervical back: Normal range of motion and neck supple.  Lymphadenopathy:     Cervical: No cervical adenopathy.  Skin:    General: Skin is warm and dry.     Capillary Refill: Capillary refill takes less than 2 seconds.     Findings: No erythema or rash.  Neurological:     General: No focal deficit present.     Mental Status: He is alert and oriented to person, place, and time.  Psychiatric:        Mood and Affect: Mood normal.        Behavior: Behavior normal.        Thought Content: Thought content normal.        Judgment: Judgment normal.      UC Treatments / Results  Labs (all labs ordered are listed, but only abnormal results are displayed) Labs Reviewed - No data to display  EKG   Radiology No results found.  Procedures Procedures (including critical care time)  Medications Ordered in UC Medications - No data to display  Initial Impression / Assessment and Plan / UC Course  I have reviewed the triage vital signs and the nursing notes.  Pertinent labs & imaging results that were available during my care of the  patient were reviewed by me and considered in my medical decision making (see chart for details).   Patient is here for evaluation of a persistent cough.  Patient was treated a month ago for bronchitis and states that he improved but then his cough returned 6 days ago after he did some leaf blowing in his yard without wearing a mask.  Symptoms started as a scratchy throat and runny nose and progressed to a cough.  Patient has a cough intermittently throughout the day but is mostly worse at night when he lays down.  Patient has no sinus pain or tenderness.  He still has a runny nose.  Nasal mucosa is inflamed with clear postnasal drip and clear nasal discharge.  Lung sounds have bronchial breath sound transmission that is scattered and mild.  No true rhonchi or rales appreciated on exam.  Exam consistent with rhinitis.   Final Clinical Impressions(s) / UC Diagnoses   Final diagnoses:  Cough     Discharge Instructions     Perform sinus irrigation twice daily with distilled water and a NeilMed sinus rinse kit.  Use the Tessalon Perles every 8 hours during the day and the Promethazine DM at nighttime for cough and congestion.  Take Claritin, Zyrtec, or Allegra daily to help with postnasal drip and runny nose.  If your symptoms worsen return for reevaluation or follow-up with your primary care provider.    ED Prescriptions    Medication Sig Dispense Auth. Provider   benzonatate (TESSALON) 100 MG capsule  Take 2 capsules (200 mg total) by mouth every 8 (eight) hours. 21 capsule Margarette Canada, NP   promethazine-dextromethorphan (PROMETHAZINE-DM) 6.25-15 MG/5ML syrup Take 5 mLs by mouth 4 (four) times daily as needed. 118 mL Margarette Canada, NP     PDMP not reviewed this encounter.   Margarette Canada, NP 10/28/20 [phone]

## 2020-10-30 DIAGNOSIS — Z940 Kidney transplant status: Principal | ICD-10-CM

## 2020-10-30 NOTE — Unmapped (Signed)
Patient called with request to be cancelled from colonoscopy on 11/01/2020 due to URI which he is on medications for. He is asking to be rescheduled. This nurse attempted to return his call. LM on VM to please call triage line again to cancel and reschedule.    This nurse transferred patient to scheduling per his request.

## 2020-10-30 NOTE — Unmapped (Signed)
[facility] Clinical Assessment & Refill Coordination Note    Miguel Ward, DOB: Nov 06, 1951  Phone: [phone] (home)     All above HIPAA information was verified with patient.     Was a Nurse, learning disability used for this call? No    Specialty Medication(s):   Transplant: Prograf 1mg      Current Outpatient Medications   Medication Sig Dispense Refill   ??? acyclovir (ZOVIRAX) 400 MG tablet TAKE (1) TABLET BY MOUTH EVERY DAY 90 tablet 3   ??? allopurinoL (ZYLOPRIM) 100 MG tablet Take 1 tablet (100 mg total) by mouth daily. 30 tablet 11   ??? aspirin (ASPIRIN LOW-STRENGTH) 81 MG chewable tablet Chew 81 mg daily.     ??? atorvastatin (LIPITOR) 20 MG tablet Take 1 tablet (20 mg total) by mouth nightly. 90 each 3   ??? calcitrioL (ROCALTROL) 0.25 MCG capsule Take 1 capsule (0.25 mcg total) by mouth every Monday, Wednesday, and Friday at 1600. 36 capsule 3   ??? CELLCEPT 250 mg capsule Take 1 capsule (250 mg total) by mouth Two (2) times a day. 60 capsule 11   ??? colchicine (COLCRYS) 0.6 mg tablet Take 1 tablet (0.6 mg total) by mouth daily as needed for up to 1 dose. 30 tablet 1   ??? enalapril (VASOTEC) 5 MG tablet TAKE (1) TABLET BY MOUTH TWICE DAILY 180 tablet 3   ??? ferrous sulfate 325 (65 FE) MG tablet Take 1 tablet (325 mg total) by mouth daily.  0   ??? fluticasone (FLONASE) 50 mcg/actuation nasal spray 1 spray into each nostril daily as needed.     ??? furosemide (LASIX) 20 MG tablet Take 40 mg in the morning and 20 mg in the evening. 270 tablet 3   ??? levothyroxine (SYNTHROID) 200 MCG tablet TAKE ONE TABLET EVERY DAY WITH (2) FOR A TOTAL DAILY DOSE OF 90 tablet 3   ??? levothyroxine (SYNTHROID) 200 MCG tablet Take 1 tab with two tab, total dose daily 90 tablet 3   ??? levothyroxine (SYNTHROID) 25 MCG tablet TAKE (2) TABLETS BY MOUTH EVERY DAY WITH1 TAB FOR A TOTAL DAILY DOSE OF 180 tablet 3   ??? magnesium oxide (MAG-OX) 400 mg (241.3 mg magnesium) tablet TAKE (1) TABLET BY MOUTH TWICE DAILY 180 each 0   ??? metoprolol succinate (TOPROL-XL) 100 MG 24 hr tablet Take 1 tablet (100 mg total) by mouth daily. 90 tablet 3   ??? omeprazole (PRILOSEC) 20 MG capsule Take 1 capsule (20 mg total) by mouth daily. 90 capsule 3   ??? predniSONE (DELTASONE) 5 MG tablet Take 1 tablet (5 mg total) by mouth daily. 90 tablet 3   ??? PROGRAF 1 mg capsule TAKE 3 CAPSULES (3MG ) BY MOUTH IN THE MORNING AND 2 CAPSULES (2MG ) BY MOUTH IN THE EVENING. 180 capsule 11   ??? simethicone (GAS-X) 80 MG chewable tablet Chew 1 tablet (80 mg total) every six (6) hours as needed for flatulence. 30 tablet 1     No current facility-administered medications for this visit.        Changes to medications: Brave reports no changes at this time.    Allergies   Allergen Reactions   ??? Penicillins Hives     Other reaction(s): HIVES       Changes to allergies: No    SPECIALTY MEDICATION ADHERENCE     Prograf 1mg   : 10 days of medicine on hand  Medication Adherence    Patient reported X missed doses in the last month: 0  Specialty Medication: prograf 1mg   Adherence tools used: patient uses a pill box to manage medications, calendar          Specialty medication(s) dose(s) confirmed: Regimen is correct and unchanged.     Are there any concerns with adherence? No    Adherence counseling provided? Not needed    CLINICAL MANAGEMENT AND INTERVENTION      Clinical Benefit Assessment:    Do you feel the medicine is effective or helping your condition? Yes    Clinical Benefit counseling provided? Not needed    Adverse Effects Assessment:    Are you experiencing any side effects? No    Are you experiencing difficulty administering your medicine? No    Quality of Life Assessment:    How many days over the past month did your transplant  keep you from your normal activities? For example, brushing your teeth or getting up in the morning. 0    Have you discussed this with your provider? Not needed    Therapy Appropriateness:    Is therapy appropriate? Yes, therapy is appropriate and should be continued    DISEASE/MEDICATION-SPECIFIC INFORMATION      N/A    PATIENT SPECIFIC NEEDS     - Does the patient have any physical, cognitive, or cultural barriers? No    - Is the patient high risk? Yes, patient is taking a REMS drug. Medication is dispensed in compliance with REMS program    - Does the patient require a Care Management Plan? No     - Does the patient require physician intervention or other additional services (i.e. nutrition, smoking cessation, social work)? No      SHIPPING     Specialty Medication(s) to be Shipped:   Transplant: Prograf 1mg     Other medication(s) to be shipped: No additional medications requested for fill at this time     Changes to insurance: No    Delivery Scheduled: Yes, Expected medication delivery date: 11/06/2020.     Medication will be delivered via UPS to the confirmed prescription address in [facility].    The patient will receive a drug information handout for each medication shipped and additional FDA Medication Guides as required.  Verified that patient has previously received a Conservation officer, historic buildings.    All of the patient's questions and concerns have been addressed.    [name]   [facility] Specialty Pharmacist

## 2020-11-05 MED FILL — PROGRAF 1 MG CAPSULE: 30 days supply | Qty: 150 | Fill #11 | Status: AC

## 2020-11-05 MED FILL — PROGRAF 1 MG CAPSULE: 30 days supply | Qty: 150 | Fill #11

## 2020-11-06 MED ORDER — LEVOTHYROXINE 200 MCG TABLET
ORAL_TABLET | 3 refills | 0 days | Status: CP
Start: 2020-11-06 — End: ?

## 2020-11-19 MED ORDER — ATORVASTATIN 20 MG TABLET
ORAL_TABLET | Freq: Every evening | ORAL | 11 refills | 30.00000 days | Status: CP
Start: 2020-11-19 — End: 2020-12-17

## 2020-11-25 NOTE — Unmapped (Signed)
Attempted to reach patient to confirm he has prep medication from cancelled procedure. LM on home VM to call to let us know. Number provided.

## 2020-11-28 DIAGNOSIS — Z940 Kidney transplant status: Principal | ICD-10-CM

## 2020-11-28 MED ORDER — PROGRAF 1 MG CAPSULE
ORAL_CAPSULE | ORAL | 11 refills | 36.00000 days | Status: CP
Start: 2020-11-28 — End: 2021-01-07

## 2020-11-28 NOTE — Unmapped (Addendum)
[facility] Refill Coordination Note    Specialty Medication(s) to be Shipped:   Transplant: Prograf 1mg     Other medication(s) to be shipped: No additional medications requested for fill at this time     Miguel Ward, DOB: 1951-01-29  Phone: [phone] (home)       All above HIPAA information was verified with patient.     Was a Nurse, learning disability used for this call? No    Completed refill call assessment today to schedule patient's medication shipment from the [facility] [phone]).       Specialty medication(s) and dose(s) confirmed: Regimen is correct and unchanged.   Changes to medications: Prospero reports no changes at this time.  Changes to insurance: No  Questions for the pharmacist: No    Confirmed patient received Welcome Packet with first shipment. The patient will receive a drug information handout for each medication shipped and additional FDA Medication Guides as required.       DISEASE/MEDICATION-SPECIFIC INFORMATION        N/A    SPECIALTY MEDICATION ADHERENCE     Medication Adherence    Patient reported X missed doses in the last month: 0  Specialty Medication: Prograf 1mg   Patient is on additional specialty medications: No  Adherence tools used: patient uses a pill box to manage medications, calendar                Prograf 1 mg: 13 days of medicine on hand *    SHIPPING     Shipping address confirmed in Epic.     Delivery Scheduled: Yes, Expected medication delivery date: 12/09/20.     Medication will be delivered via Same Day Courier to the prescription address in Epic [facility].    [name]   [facility] Specialty Pharmacist

## 2020-11-29 DIAGNOSIS — Z940 Kidney transplant status: Principal | ICD-10-CM

## 2020-11-29 MED ORDER — TACROLIMUS 1 MG CAPSULE, IMMEDIATE-RELEASE
ORAL_CAPSULE | ORAL | 11 refills | 30 days | Status: CP
Start: 2020-11-29 — End: 2021-01-07
  Filled 2020-12-09: qty 150, 30d supply, fill #0

## 2020-11-29 NOTE — Unmapped (Signed)
12/17: pt is aware that brand prograf is $196.96/1 month. Generic tac is $37.49/1 month. Clinic is ok with switch to generic.-ef    [facility] Clinical Assessment & Refill Coordination Note    Miguel Ward, DOB: 01/12/1951  Phone: [phone] (home)     All above HIPAA information was verified with patient.     Was a Nurse, learning disability used for this call? No    Specialty Medication(s):   Transplant: Prograf 1mg  and tacrolimus 1mg      [name] Pharmacist Intervention    Type of intervention: change from brand to generic of nti drug    Medication: prograf/tacrolimus    Problem: high copay on brand prograf. Clinic ok with switch to generic.    Intervention: I spoke with patient and he is aware and ok with switch to generic. He is aware to contact the clinic on date of switch and that bloodwork may be needed.    Follow up needed: I will message coordinator as well    Approximate time spent: 10 minutes    Miguel Ward   [facility] Specialty Pharmacist          Current Outpatient Medications   Medication Sig Dispense Refill   ??? acyclovir (ZOVIRAX) 400 MG tablet TAKE (1) TABLET BY MOUTH EVERY DAY 90 tablet 3   ??? allopurinoL (ZYLOPRIM) 100 MG tablet Take 1 tablet (100 mg total) by mouth daily. 30 tablet 11   ??? aspirin (ASPIRIN LOW-STRENGTH) 81 MG chewable tablet Chew 81 mg daily.     ??? atorvastatin (LIPITOR) 20 MG tablet Take 1 tablet (20 mg total) by mouth nightly. 30 tablet 11   ??? calcitrioL (ROCALTROL) 0.25 MCG capsule Take 1 capsule (0.25 mcg total) by mouth every Monday, Wednesday, and Friday at 1600. 36 capsule 3   ??? CELLCEPT 250 mg capsule Take 1 capsule (250 mg total) by mouth Two (2) times a day. 60 capsule 11   ??? colchicine (COLCRYS) 0.6 mg tablet Take 1 tablet (0.6 mg total) by mouth daily as needed for up to 1 dose. 30 tablet 1   ??? enalapril (VASOTEC) 5 MG tablet TAKE (1) TABLET BY MOUTH TWICE DAILY 180 tablet 3   ??? ferrous sulfate 325 (65 FE) MG tablet Take 1 tablet (325 mg total) by mouth daily.  0   ??? fluticasone (FLONASE) 50 mcg/actuation nasal spray 1 spray into each nostril daily as needed.     ??? furosemide (LASIX) 20 MG tablet Take 40 mg in the morning and 20 mg in the evening. 270 tablet 3   ??? levothyroxine (SYNTHROID) 200 MCG tablet Take 1 tab with two tab, total dose daily 90 tablet 3   ??? levothyroxine (SYNTHROID) 200 MCG tablet TAKE ONE TABLET EVERY DAY WITH (2) FOR A TOTAL DAILY DOSE OF 90 tablet 3   ??? levothyroxine (SYNTHROID) 25 MCG tablet TAKE (2) TABLETS BY MOUTH EVERY DAY WITH1 TAB FOR A TOTAL DAILY DOSE OF 180 tablet 3   ??? magnesium oxide (MAG-OX) 400 mg (241.3 mg magnesium) tablet TAKE (1) TABLET BY MOUTH TWICE DAILY 180 each 0   ??? metoprolol succinate (TOPROL-XL) 100 MG 24 hr tablet Take 1 tablet (100 mg total) by mouth daily. 90 tablet 3   ??? omeprazole (PRILOSEC) 20 MG capsule Take 1 capsule (20 mg total) by mouth daily. 90 capsule 3   ??? predniSONE (DELTASONE) 5 MG tablet  Take 1 tablet (5 mg total) by mouth daily. 90 tablet 3   ??? PROGRAF 1 mg capsule TAKE 3 CAPSULES (3MG ) BY MOUTH IN THE MORNING AND 2 CAPSULES (2MG ) BY MOUTH IN THE EVENING. 180 capsule 11   ??? simethicone (GAS-X) 80 MG chewable tablet Chew 1 tablet (80 mg total) every six (6) hours as needed for flatulence. 30 tablet 1   ??? tacrolimus (PROGRAF) 1 MG capsule Take 3 capsules (3 mg total) by mouth every morning AND 2 capsules (2 mg total) nightly. 150 capsule 11     No current facility-administered medications for this visit.        Changes to medications: Miguel Ward reports no changes at this time.    Allergies   Allergen Reactions   ??? Penicillins Hives     Other reaction(s): HIVES       Changes to allergies: No    SPECIALTY MEDICATION ADHERENCE     Tacrolimus 1mg   : 0 days of medicine on hand patient will start on this fill  Prograf 1mg   : 12 days of medicine on hand patient will switch to generic on this fill      Medication Adherence    Patient reported X missed doses in the last month: 0  Specialty Medication: prograf 1mg   Patient is on additional specialty medications: Yes  Additional Specialty Medications: Tacrolimus 1mg   Patient Reported Additional Medication X Missed Doses in the Last Month: 0  Adherence tools used: patient uses a pill box to manage medications, calendar          Specialty medication(s) dose(s) confirmed: Regimen is correct and unchanged.     Are there any concerns with adherence? No    Adherence counseling provided? Not needed    CLINICAL MANAGEMENT AND INTERVENTION      Clinical Benefit Assessment:    Do you feel the medicine is effective or helping your condition? Yes    Clinical Benefit counseling provided? Not needed    Adverse Effects Assessment:    Are you experiencing any side effects? No    Are you experiencing difficulty administering your medicine? No    Quality of Life Assessment:    How many days over the past month did your transplant  keep you from your normal activities? For example, brushing your teeth or getting up in the morning. 0    Have you discussed this with your provider? Not needed    Therapy Appropriateness:    Is therapy appropriate? Yes, therapy is appropriate and should be continued    DISEASE/MEDICATION-SPECIFIC INFORMATION      N/A    PATIENT SPECIFIC NEEDS     - Does the patient have any physical, cognitive, or cultural barriers? No    - Is the patient high risk? No    - Does the patient require a Care Management Plan? No     - Does the patient require physician intervention or other additional services (i.e. nutrition, smoking cessation, social work)? No      SHIPPING     Specialty Medication(s) to be Shipped:   Transplant: tacrolimus 1mg     Other medication(s) to be shipped: No additional medications requested for fill at this time     Changes to insurance: No    Delivery Scheduled: Yes, Expected medication delivery date: 12/09/2020.  This delivery was already set up by another team member. Just changing it to generic with appropriate documentation    Medication will be delivered via Same Day Courier to  the confirmed prescription address in [facility].    The patient will receive a drug information handout for each medication shipped and additional FDA Medication Guides as required.  Verified that patient has previously received a Conservation officer, historic buildings.    All of the patient's questions and concerns have been addressed.    Miguel Ward   [facility] Specialty Pharmacist

## 2020-11-29 NOTE — Unmapped (Signed)
New script sent over for generic tac to [facility]

## 2020-12-09 MED FILL — TACROLIMUS 1 MG CAPSULE, IMMEDIATE-RELEASE: 30 days supply | Qty: 150 | Fill #0 | Status: AC

## 2020-12-16 MED ORDER — MAGNESIUM OXIDE 400 MG (241.3 MG MAGNESIUM) TABLET
ORAL_TABLET | Freq: Two times a day (BID) | ORAL | 11 refills | 30 days | Status: CP
Start: 2020-12-16 — End: ?

## 2020-12-17 MED ORDER — ATORVASTATIN 20 MG TABLET
ORAL_TABLET | 11 refills | 0 days | Status: CP
Start: 2020-12-17 — End: ?

## 2020-12-23 NOTE — Unmapped (Signed)
Patient called to say his daughter tested positive for covid today. Her symptoms started yesterday    Patient concerned about what he needs to do.    Denies any covid symptoms.    Patient will get covid test tomorrow/wednesday and let me know what his test result is. He will call if he develops any symptoms    Verbalized understanding

## 2021-01-03 DIAGNOSIS — Z940 Kidney transplant status: Principal | ICD-10-CM

## 2021-01-03 MED ORDER — MYCOPHENOLATE MOFETIL 250 MG CAPSULE
ORAL_CAPSULE | Freq: Two times a day (BID) | ORAL | 11 refills | 30 days | Status: CP
Start: 2021-01-03 — End: 2022-01-03

## 2021-01-06 MED ORDER — ACYCLOVIR 400 MG TABLET
ORAL_TABLET | Freq: Every day | ORAL | 3 refills | 90 days | Status: CP
Start: 2021-01-06 — End: ?

## 2021-01-07 DIAGNOSIS — Z940 Kidney transplant status: Principal | ICD-10-CM

## 2021-01-07 MED ORDER — TACROLIMUS 1 MG CAPSULE, IMMEDIATE-RELEASE
ORAL_CAPSULE | ORAL | 11 refills | 30 days | Status: CP
Start: 2021-01-07 — End: ?
  Filled 2021-01-15: qty 150, 30d supply, fill #0

## 2021-01-07 NOTE — Unmapped (Addendum)
Transplant Nephrology Clinic Visit      History of Present Illness    Patient is a 70 y.o. male who underwent deceased donor transplant on 2007/06/30 secondary to lupus nephritis. His post transplant course has been complicated by early cellular rejection treated with Thymoglobulin. At that time, he was enrolled in the JAK-3 inhibitor trial and was taken off study drug. Additionally, he was noted to have a large pericardial effusion of greater than 1 L in 08/2007. At that time his TSH was 258. This improved with pericardiocentesis and thyroid replacement and has not reaccumulated. In 2009, he had coronary artery disease which required stenting. Patient does not have evidence of donor specific antibodies. His creatinine was between 1.5 and 2.2, but has been more in the low to mid 2s since his right native nephrectomy on 02/20/14 for renal cell carcinoma.  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 No evidence of DSA antibodies as of 09/05/20. Most recent baseline creatinine while hospitalized was 2.6-2.8, had been around 2.2-2.6 immediately prior to that.    He was admitted to [facility] from 9/15 through 08/31/2016 after presenting with fever and leukocytosis. His initial urine culture specimen was lost, but he was treated for presumptive UTI. He improved clinically on antibiotics. During that evaluation he had a CT of the abdomen on 08/29/2016 that showed New indeterminate liver lesions concerning for metastatic disease versus post transplant lymphoproliferative disorder versus abscesses. He then underwent MRI on 9/18 which showed Two liver lesions with minimal enhancement. Differential includes post transplant lymphoproliferative disorder, sequelae of prior infection or hematoma, less likely acute abscess. Metastases unlikely given enhancement characteristics. EBV was negative.    As an outpatient he then underwent a PET scan on 09/14/2016 which showed Enlarging right hepatic lobe lesion with intense FDG uptake and central area of photopenia, concerning for necrotic metastasis, less likely abscess. Stable right hepatic dome lesion with intense FDG uptake, concerning for additional metastasis. After consult with radiology, they felt the best approach was ultrasound guided biopsy which he had on 10/01/2016. Pathology revealed Fragments of liver tissue with parenchymal extinction, mixed inflammatory infiltrates, and reactive-appearing stromal changes (see comment), - Increased iron staining within adjacent intact hepatic parenchyma (grade 3 of 4), - Sinusoidal congestion. The differential diagnosis includes sequelae from an abscess (e.g., inflammatory pseudotumor) versus nonspecific inflammatory changes adjacent to an unsampled mass. Correlation with the radiologic findings and clinical follow up are suggested.    The patient had a repeat MRI on 02/01/17 to visualize lesions seen on MRI in September. His repeat MRI revealed interval moderate decrease in the size of previously detected liver lesions, which could represent the sequelae of resolving right hepatic lobe infection with interval decrease in size of large right hepatic lobe lesion with internal T1 hyperintensity likely reflecting proteinacious material and mild internal and perilesional enhancement. Interval decrease in size and conspicuity of small hepatic dome lesion is also noted and this could also represent a resolving abscess. They recommended a re-scan in approximately 3 months (04/2017). I reviewed the report and images with Dr. Rush Barer who agreed that the lesion continues to look like resolving infection.     Patient underwent renal biopsy on 12/30/2017 to evaluate proteinuria which had increased over the last year from 0.75 to 1.4. 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 no proliferative glomerular lesions. These changes can indicate a minor recurrence of the lupus nephritis not carrying any direct therapeutic and prognostic significance at the present time.); Tubular pigment deposits, consistent with lipofuscin.    He was admitted 3/1 through 02/14/18 with RSV infection, AKI and new diastolic and systolic heart failure. TTE showed moderate EF 40-45%, reduced from 52% reported in January 2015. Patient's home lasix was increased to 40 mg qd from 20 mg qd with improvement in symptoms. Creatinine peaked at 2.73, 2.69 on discharge. It was suspected that patient's RSV infection worsened his heart failure and caused AKI is due to reduced perfusion. Patient's enalapril was held on discharge.     He was admitted 11/4 through 10/21/18 after presenting with dyspnea secondary to acute on chronic systolic heart failure. Echo showed globally, mildly decreased EF of 45% and grade III diastolic dysfunction. Patient was diuresed with lasix and discharged home on 80 mg bid. He was also found to have 12 L/min flow on PVL fistula study and underwent fistula banding to prevent worsening cardiac function from high output heart failure. Creatinine was elevated at time of admission to 3 but downtrended with diuresis to 2.3-2.8. Tac dose was decreased to 3 mg qAM and 2 mg qPM. No other changes to immunosuppression. Enalapril held due to kidney dysfunction.     At surgery visit on 11/17/18 to discuss fistula ligation (as flow post banding remained at 10 L/min), EKG was ordered and patient noted to be in a-fib. Was able to see cardiology that afternoon. No episodes of a-fib while monitored in the hospital and no prior history. Patient asymptomatic and hemodynamically stable. Rate controlled on metoprolol 100 mg daily, not started on anticoagulation at that time.      He underwent banding of right upper AV fistula on 02/07/19. Tolerated procedure well. He was seen in the emergency department on 02/11/19, presenting with increased swelling and intermittent bleeding from AV fistula incision site. Vascular surgery and Dr. Norma Fredrickson were consulted who both agreed patient was stable for discharge home, so long as there was no hematoma or signs of infection.     Interval history since last visit 09/05/20    He called his coordinator on 09/09/20 reporting blood in stool. He was seen in the ED at [facility], Hgb was 11.3 down from 12.04 days prior. Rectal exam with partial rectal prolapse (known) and a mild amount of blood. Felt to be appropriate for discharge, with plans to recheck Hgb in 1-2 days and urgent colonoscopy. Reported another bloody bowel movement 09/11/20, recommended to return to ED if it continued. On 09/12/20 was noted to have an elevated creatinine to 3.24, Hgb down to 8.9. For some reason those labs were not routed to anyone on the care team so were not seen in real time.     There is a phone note from GI on 09/18/20 stating that patient reported a cough since 09/05/20, nurse advised that the colonoscopy be cancelled and transferred him to scheduling to change appointment. It is now scheduled for 02/04/21. His coordinator followed up 09/24/20 and patient denied further bleeding. He had gone to urgent care that day and diagnosed with bronchitis, given doxycycline and tussinex. Still had not repeated labs by 10/02/20, he was called by his coordinator and encouraged to go. Had labs 10/03/20 that did not populate into our system, creatinine improved at 2.85, Hgb improved but still low at 9.7 (  had been 8.9 on 09/12/20, 12.0 on 02/07/20).    He was seen in urgent care at [facility] 10/28/20 complaining of persistent cough. Don't see that labs or COVID test were done. Recommended symptomatic therapy. He called on 10/30/20 to cancel his colonoscopy 11/01/20 due to URI (we were unaware he was cancelling).    Called 12/24/19 to report daughter tested positive for COVID, we recommended that he get tested as well.     He presents today for follow up.    He notes his recent COVID test was negative.    He denies any further blood in his stool. Has had to cancel his colonoscopies for an ongoing cough.    He reports having a bad cough for several weeks. Productive only of clear mucus. He notes his sinuses have been draining. 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 Has tried honey and lemon without improvement. He doesn't think it is getting worse but it has not gotten better. He notes his throat is dry when he wakes in the morning. No fever. No SOB. Weight has been stable, minimal lower extremity edema.    Blood pressures at home are 135-145 systolic.    He specifically denies fever, myalgias, upper respiratory/GI symptoms, or change in taste/smell.    Last dose of Prograf: 10-11 pm     Review of Systems    Otherwise on review of systems patient denies fever or chills, chest pain, SOB, PND, orthopnea or edema. No N/V/abdominal pain. No current diarrhea. No dysuria, hematuria or difficulty voiding. All other systems are reviewed and are negative.    Medications    Current Outpatient Medications   Medication Sig Dispense Refill   ??? acyclovir (ZOVIRAX) 400 MG tablet Take 1 tablet (400 mg total) by mouth daily. 90 tablet 3   ??? allopurinoL (ZYLOPRIM) 100 MG tablet Take 1 tablet (100 mg total) by mouth daily. 30 tablet 11   ??? aspirin (ASPIRIN LOW-STRENGTH) 81 MG chewable tablet Chew 81 mg daily.     ??? atorvastatin (LIPITOR) 20 MG tablet TAKE 1 TABLET BY MOUTH NIGHTLY. 30 tablet 11   ??? calcitrioL (ROCALTROL) 0.25 MCG capsule Take 1 capsule (0.25 mcg total) by mouth every Monday, Wednesday, and Friday at 1600. 36 capsule 3   ??? colchicine (COLCRYS) 0.6 mg tablet Take 1 tablet (0.6 mg total) by mouth daily as needed for up to 1 dose. 30 tablet 1   ??? enalapril (VASOTEC) 5 MG tablet TAKE (1) TABLET BY MOUTH TWICE DAILY 180 tablet 3   ??? ferrous sulfate 325 (65 FE) MG tablet Take 1 tablet (325 mg total) by mouth daily.  0   ??? fluticasone (FLONASE) 50 mcg/actuation nasal spray 1 spray into each nostril daily as needed.     ??? furosemide (LASIX) 20 MG tablet Take 40 mg in the morning and 20 mg in the evening. 270 tablet 3   ??? levothyroxine (SYNTHROID) 200 MCG tablet Take 1 tab with two tab, total dose daily 90 tablet 3   ??? levothyroxine (SYNTHROID) 200 MCG tablet TAKE ONE TABLET EVERY DAY WITH (2) FOR A TOTAL DAILY DOSE OF 90 tablet 3   ??? levothyroxine (SYNTHROID) 25 MCG tablet TAKE (2) TABLETS BY MOUTH EVERY DAY WITH1 TAB FOR A TOTAL DAILY DOSE OF 180 tablet 3   ??? magnesium oxide (MAG-OX) 400 mg (241.3 mg elemental magnesium) tablet Take 1 tablet (400 mg total)  by mouth Two (2) times a day. 60 tablet 11   ??? metoprolol succinate (TOPROL-XL) 100 MG 24 hr tablet Take 1 tablet (100 mg total) by mouth daily. 90 tablet 3   ??? mycophenolate (CELLCEPT) 250 mg capsule Take 1 capsule (250 mg total) by mouth Two (2) times a day. 60 capsule 11   ??? omeprazole (PRILOSEC) 20 MG capsule Take 1 capsule (20 mg total) by mouth daily. 90 capsule 3   ??? predniSONE (DELTASONE) 5 MG tablet Take 1 tablet (5 mg total) by mouth daily. 90 tablet 3   ??? simethicone (GAS-X) 80 MG chewable tablet Chew 1 tablet (80 mg total) every six (6) hours as needed for flatulence. 30 tablet 1   ??? tacrolimus (PROGRAF) 1 MG capsule Take 3 capsules (3 mg total) by mouth every morning AND 2 capsules (2 mg total) nightly. 150 capsule 11     No current facility-administered medications for this visit.       Physical Exam    BP 179/89  - Pulse 71  - Wt 73.8 kg (162 lb 9.6 oz)  - BMI 22.05 kg/m??   General: Patient is a pleasant male in no apparent distress.  Eyes: Sclera anicteric.   ENT: Mask in place.  Neck: Supple without LAD/JVD/bruits. No lymphadenopathy appreciated   Lungs: Clear to auscultation bilaterally, no wheezes/rales/rhonchi.  Cardiovascular: Regular rate and rhythm without murmurs, rubs or gallops.  Abdomen: Soft, notender/nondistended. Positive bowel sounds. No tenderness over the graft.  Extremities: Without edema, joints without evidence of synovitis.  Skin: Without rash.  Neurological: Grossly nonfocal.  Psychiatric: Mood and affect appropriate.      Laboratory Results    Recent Results (from the past 170 hour(s))   Magnesium Level    Collection Time: 01/08/21 12:23 PM   Result Value Ref Range    Magnesium 2.0 1.6 - 2.6 mg/dL   Phosphorus Level    Collection Time: 01/08/21 12:23 PM   Result Value Ref Range    Phosphorus 3.3 2.4 - 5.1 mg/dL   Tacrolimus, Trough   [facility])    Collection Time: 01/08/21 12:23 PM   Result Value Ref Range    Tacrolimus, Trough 5.4 5.0 - 15.0 ng/mL   Basic Metabolic Panel    Collection Time: 01/08/21 12:23 PM   Result Value Ref Range    Sodium 145 135 - 145 mmol/L    Potassium 4.5 3.4 - 4.5 mmol/L    Chloride 110 (H) 98 - 107 mmol/L    CO2 25.4 20.0 - 31.0 mmol/L    Anion Gap 10 5 - 14 mmol/L    BUN 57 (H) 9 - 23 mg/dL    Creatinine 1.61 (H) 0.60 - 1.10 mg/dL    BUN/Creatinine Ratio 24     EGFR CKD-EPI Non-African American, Male 27 (L) >=60 mL/min/1.40m2    EGFR CKD-EPI African American, Male 31 (L) >=60 mL/min/1.44m2    Glucose 86 70 - 179 mg/dL    Calcium 09.6 (H) 8.7 - 10.4 mg/dL   Iron Panel    Collection Time: 01/08/21 12:23 PM   Result Value Ref Range    Iron 72 65 - 175 ug/dL    TIBC 045 (L) 409 - 811 ug/dL    Iron Saturation (%) 35 %   Ferritin    Collection Time: 01/08/21 12:23 PM   Result Value Ref Range    Ferritin 406.9 (H) 10.5 - 307.3 ng/mL   CMV DNA, quantitative, PCR    Collection Time: 01/08/21  12:23 PM   Result Value Ref Range    CMV Viral Ld Not Detected Not Detected    CMV Quant      CMV Quant Log10      CMV Comment     COVID-19 IgG Spike Antibody    Collection Time: 01/08/21 12:23 PM   Result Value Ref Range    COVID-19 IgG Negative Negative   CBC w/ Differential    Collection Time: 01/08/21 12:23 PM   Result Value Ref Range    WBC 5.7 3.5 - 10.5 10*9/L    RBC 4.25 (L) 4.32 - 5.72 10*12/L    HGB 11.4 (L) 13.5 - 17.5 g/dL    HCT 16.1 (L) 09.6 - 50.0 %    MCV 87.4 81.0 - 95.0 fL    MCH 26.9 26.0 - 34.0 pg    MCHC 30.8 30.0 - 36.0 g/dL    RDW 04.5 40.9 - 81.1 %    MPV 10.8 (H) 7.0 - 10.0 fL    Platelet 114 (L) 150 - 450 10*9/L    Neutrophils % 69.8 %    Lymphocytes % 15.7 %    Monocytes % 12.5 %    Eosinophils % 1.7 %    Basophils % 0.3 %    Absolute Neutrophils 3.9 1.7 - 7.7 10*9/L    Absolute Lymphocytes 0.9 0.7 - 4.0 10*9/L    Absolute Monocytes 0.7 0.1 - 1.0 10*9/L    Absolute Eosinophils 0.1 0.0 - 0.7 10*9/L    Absolute Basophils 0.0 0.0 - 0.1 10*9/L   Urinalysis    Collection Time: 01/08/21 12:24 PM   Result Value Ref Range    Color, UA Yellow     Clarity, UA Clear     Specific Gravity, UA 1.010 1.005 - 1.030    pH, UA 6.0 5.0 - 9.0    Leukocyte Esterase, UA Negative Negative    Nitrite, UA Negative Negative    Protein, UA Negative Negative    Glucose, UA Negative Negative    Ketones, UA Negative Negative    Urobilinogen, UA 0.2 mg/dL 0.2 - 2.0 mg/dL    Bilirubin, UA Negative Negative    Blood, UA Trace (A) Negative    RBC, UA 1 <3 /HPF    WBC, UA <1 <2 /HPF    Squam Epithel, UA <1 0 - 5 /HPF    Bacteria, UA None Seen None Seen /HPF   Culture, Urine    Collection Time: 01/08/21 12:24 PM    Specimen: Clean Catch; Urine   Result Value Ref Range    Urine Culture, Comprehensive NO GROWTH    Protein/Creatinine Ratio, Urine    Collection Time: 01/08/21 12:24 PM   Result Value Ref Range    Creat U 23.1 Undefined mg/dL    Protein, Ur 91.4 mg/dL    Protein/Creatinine Ratio, Urine 0.879 Undefined   Cytology - Urine    Collection Time: 01/08/21 12:24 PM   Result Value Ref Range    Final Diagnosis       A. Urine for decoy cells:  - No high grade urothelial carcinoma identified  - Rare atypical squamous cells present  - No decoy cells identified   - See comment    This electronic signature is attestation that the pathologist personally reviewed the submitted material(s) and the final diagnosis reflects that evaluation.      Comment       Rare atypical squamous cells are noted, cannot exclude HPV effect.  Gross Description            Urine clean catch voided decoy    90mL clear, yellow fluid, unfixed    Materials Prepared & Examined    Smear Slides.......0  Monolayers..........1  Cytospins.............0  Cell Blocks...........0  Core Biopsy.........0  Touch Prep..........0          Microscopic Description       Microscopic examination substantiates the above diagnosis.    Resident Physician: None Assigned      EMBEDDED IMAGES      Specimen Adequacy Satisfactory for evaluation     Disclaimer       Unless otherwise specified, specimens are preserved using 10% neutral buffered formalin. For cases in which immunohistochemical and/or in-situ hybridization stains are performed, the following statement applies: Appropriate controls for each stain (positive controls with or without negative controls) have been evaluated and stain as expected. These stains have not been separately validated for use on decalcified specimens and should be interpreted with caution in that setting. Some of the reagents used for these stains may be classified as analyte specific reagents (ASR). Tests using ASRs were developed, and their performance characteristics were determined, by the Anatomic Pathology Department [facility]). They have not been cleared or approved by the [place] Food and Drug Administration (FDA). The FDA does not require these tests to go through premarket FDA review. These tests are used for clinical purposes. They should not be regarded as investigational or for research. This laboratory is certified under the Clinical Laboratory Improvement Amendments (CLIA) as qualified to perform high complexity clinical laboratory testing.         CXR 01/08/21:  IMPRESSION:  ??  Pulmonary vascular congestion without airspace edema.    Renal Ultrasound 01/08/21:  IMPRESSION:  1.The right native kidney is surgically absent.  2.The previous indeterminate, subcentimeter lesion within the native left kidney on the prior MRI is not well visualized on today's examination.   3.The native left kidney demonstrates multiple renal cysts.   4.The previously identified echogenic lesion within the superior pole/interpolar region measures up to 1.7 increased in size from the prior ultrasound dated 09/27/2019, possibly cortical neoplasm. Biopsy versus short interval follow-up is recommended   5.Mild patchy perfusion of the right transplant kidney demonstrates improved resistive indices, now measuring up to 0.8, previously 1.0.      Assessment and Plan    1. Status post renal transplant. Creatinine has been above his baseline on the last two measurements. Is 2.38 today. His Prograf trough of 5.4 is a 13.5 hour trough and close to goal of 6-8. He remains on a reduced dose of CellCept 250mg  BID with history of diarrhea and malignancy. Will continue current immunosuppression.    2. Rectal bleeding. Hgb in October was still low, did not get labs until today, is 11.4. Iron stores normal. Has colonoscopy scheduled 02/04/21, reinforced importance of keeping that appointment.    3. Chronic cough/decreased voice. Will make referral to ENT for further evaluation.    4. Lesion in transplant kidney by MRI 10/08/20. Felt due to post biopsy change, but could not rule out malignancy. [place] today concerning for cortical neoplasm in the transplanted kidney. Will discuss with transplant surgery the next steps. Suspect will need biopsy.    5. Fluid overload and HFrEF. He underwent  fistula banding in February 2020 and fluid retention/dyspnea have resolved since that time. He is euvolemic today on current Lasix dose. CXR showed pulmonary congestion but no edema.  6. Right renal cell carcinoma status post nephrectomy. Imaging previously showed hepatic mass that was biopsied and consistent with inflammatory process. MRI from 10/08/20 remains reassuring, with unchanged size and overall appearance of hepatic lesion. Surgery did not recommend any additional diagnostic workup and will continue to monitor annually with imaging.    7. Hypertension. Blood pressure in clinic is elevated, he consistently runs higher in the office. Home blood pressures at goal. Will continue current regimen.    8. Atrial fibrillation. Rate controlled on current dose of metoprolol. He is not currently on anticoagulation given history of thrombocytopenia. Cardiology is managing.      9. Hypothyroidism. On Synthroid at 250 mcg daily.     10. Hypomagnesemia. Magnesium normal today at 2.0 on 400 mg BID.     11. Proteinuria.  UP/C peaked at 1.5 on 05/20/2018 -> 0.355 on 09/08/19 -> 0.57 (01/05/20) -> 0.137 (05/02/20) -> undetectable (09/05/20) -> 0.879 today.    12. Health maintenance. He received Moderna COVID vaccines on 01/18/20, 02/08/20 and 09/05/20. Due for fourth dose 02/05/21.  He received a flu shot in clinic 09/05/20. Has completed Shingrix and pneumococcal series. His COVID IgG spike antibody is negative, so will see if he is interested in Evusheld.    We discussed that while we continue to recommend the COVID vaccine for all eligible patients, we know that kidney transplant patients do not respond as strongly due to their immunosuppression, so he should continue to follow the CDC guidelines for patients who are not vaccinated. I would also encourage all household members and close contacts that are eligible for vaccination to get vaccinated. We discussed the importance of ongoing social distancing, wearing a mask outside the home, and hand washing/sanitizing.     13. Will see patient back in 4 months, or sooner if needed.    ADDENDUM:  Discussed patient in multidisciplinary conference with transplant surgery on 01/16/21. They reviewed the images and recommended repeating the MRI, as it has been 3+ months and they should be able to see if the lesion is growing, also recommended doing it with gadolinium. His GFR is borderline for gadolinium but in this context I think the benefits outweigh the risks for using gadolinium so will proceed with MRI abdomen with contrast.

## 2021-01-08 ENCOUNTER — Ambulatory Visit: Admit: 2021-01-08 | Discharge: 2021-01-08 | Payer: MEDICARE

## 2021-01-08 ENCOUNTER — Ambulatory Visit: Admit: 2021-01-08 | Discharge: 2021-01-08 | Payer: MEDICARE | Attending: Nephrology | Primary: Nephrology

## 2021-01-08 DIAGNOSIS — N281 Cyst of kidney, acquired: Principal | ICD-10-CM

## 2021-01-08 DIAGNOSIS — Z940 Kidney transplant status: Principal | ICD-10-CM

## 2021-01-08 DIAGNOSIS — K625 Hemorrhage of anus and rectum: Principal | ICD-10-CM

## 2021-01-08 DIAGNOSIS — I5020 Unspecified systolic (congestive) heart failure: Principal | ICD-10-CM

## 2021-01-08 DIAGNOSIS — R490 Dysphonia: Principal | ICD-10-CM

## 2021-01-08 DIAGNOSIS — R059 Cough, unspecified: Secondary | ICD-10-CM | POA: Diagnosis not present

## 2021-01-08 DIAGNOSIS — I10 Essential (primary) hypertension: Principal | ICD-10-CM

## 2021-01-08 DIAGNOSIS — D849 Immunodeficiency, unspecified: Principal | ICD-10-CM

## 2021-01-08 DIAGNOSIS — R0989 Other specified symptoms and signs involving the circulatory and respiratory systems: Secondary | ICD-10-CM | POA: Diagnosis not present

## 2021-01-08 DIAGNOSIS — N289 Disorder of kidney and ureter, unspecified: Secondary | ICD-10-CM | POA: Diagnosis not present

## 2021-01-08 LAB — CBC W/ AUTO DIFF
BASOPHILS ABSOLUTE COUNT: 0 10*9/L (ref 0.0–0.1)
BASOPHILS RELATIVE PERCENT: 0.3 %
EOSINOPHILS ABSOLUTE COUNT: 0.1 10*9/L (ref 0.0–0.7)
EOSINOPHILS RELATIVE PERCENT: 1.7 %
HEMATOCRIT: 37.2 % — ABNORMAL LOW (ref 38.0–50.0)
HEMOGLOBIN: 11.4 g/dL — ABNORMAL LOW (ref 13.5–17.5)
LYMPHOCYTES ABSOLUTE COUNT: 0.9 10*9/L (ref 0.7–4.0)
LYMPHOCYTES RELATIVE PERCENT: 15.7 %
MEAN CORPUSCULAR HEMOGLOBIN CONC: 30.8 g/dL (ref 30.0–36.0)
MEAN CORPUSCULAR HEMOGLOBIN: 26.9 pg (ref 26.0–34.0)
MEAN CORPUSCULAR VOLUME: 87.4 fL (ref 81.0–95.0)
MEAN PLATELET VOLUME: 10.8 fL — ABNORMAL HIGH (ref 7.0–10.0)
MONOCYTES ABSOLUTE COUNT: 0.7 10*9/L (ref 0.1–1.0)
MONOCYTES RELATIVE PERCENT: 12.5 %
NEUTROPHILS ABSOLUTE COUNT: 3.9 10*9/L (ref 1.7–7.7)
NEUTROPHILS RELATIVE PERCENT: 69.8 %
PLATELET COUNT: 114 10*9/L — ABNORMAL LOW (ref 150–450)
RED BLOOD CELL COUNT: 4.25 10*12/L — ABNORMAL LOW (ref 4.32–5.72)
RED CELL DISTRIBUTION WIDTH: 13.9 % (ref 12.0–15.0)
WBC ADJUSTED: 5.7 10*9/L (ref 3.5–10.5)

## 2021-01-08 LAB — PHOSPHORUS: PHOSPHORUS: 3.3 mg/dL (ref 2.4–5.1)

## 2021-01-08 LAB — BASIC METABOLIC PANEL
ANION GAP: 10 mmol/L (ref 5–14)
BLOOD UREA NITROGEN: 57 mg/dL — ABNORMAL HIGH (ref 9–23)
BUN / CREAT RATIO: 24
CALCIUM: 10.5 mg/dL — ABNORMAL HIGH (ref 8.7–10.4)
CHLORIDE: 110 mmol/L — ABNORMAL HIGH (ref 98–107)
CO2: 25.4 mmol/L (ref 20.0–31.0)
CREATININE: 2.38 mg/dL — ABNORMAL HIGH
EGFR CKD-EPI AA MALE: 31 mL/min/{1.73_m2} — ABNORMAL LOW (ref >=60)
EGFR CKD-EPI NON-AA MALE: 27 mL/min/{1.73_m2} — ABNORMAL LOW (ref >=60)
GLUCOSE RANDOM: 86 mg/dL (ref 70–179)
POTASSIUM: 4.5 mmol/L (ref 3.4–4.5)
SODIUM: 145 mmol/L (ref 135–145)

## 2021-01-08 LAB — IRON PANEL
IRON SATURATION: 35 %
IRON: 72 ug/dL
TOTAL IRON BINDING CAPACITY: 207 ug/dL — ABNORMAL LOW (ref 250–425)

## 2021-01-08 LAB — URINALYSIS
BACTERIA: NONE SEEN /HPF
BILIRUBIN UA: NEGATIVE
GLUCOSE UA: NEGATIVE
KETONES UA: NEGATIVE
LEUKOCYTE ESTERASE UA: NEGATIVE
NITRITE UA: NEGATIVE
PH UA: 6 (ref 5.0–9.0)
PROTEIN UA: NEGATIVE
RBC UA: 1 /HPF (ref <3)
SPECIFIC GRAVITY UA: 1.01 (ref 1.005–1.030)
SQUAMOUS EPITHELIAL: <1 /HPF (ref 0–5)
UROBILINOGEN UA: 0.2
WBC UA: <1 /HPF (ref <2)

## 2021-01-08 LAB — MAGNESIUM: MAGNESIUM: 2 mg/dL (ref 1.6–2.6)

## 2021-01-08 LAB — PROTEIN / CREATININE RATIO, URINE
CREATININE, URINE: 23.1 mg/dL
PROTEIN URINE: 20.3 mg/dL
PROTEIN/CREAT RATIO, URINE: 0.879

## 2021-01-08 LAB — FERRITIN: FERRITIN: 406.9 ng/mL — ABNORMAL HIGH

## 2021-01-09 LAB — CMV DNA, QUANTITATIVE, PCR: CMV VIRAL LD: NOT DETECTED

## 2021-01-09 LAB — TACROLIMUS LEVEL, TROUGH: TACROLIMUS, TROUGH: 5.4 ng/mL (ref 5.0–15.0)

## 2021-01-09 LAB — COVID-19 IGG SPIKE PROTEIN: COVID-19 IGG: NEGATIVE

## 2021-01-10 DIAGNOSIS — D849 Immunodeficiency, unspecified: Principal | ICD-10-CM

## 2021-01-10 DIAGNOSIS — B998 Other infectious disease: Principal | ICD-10-CM

## 2021-01-10 DIAGNOSIS — Z940 Kidney transplant status: Principal | ICD-10-CM

## 2021-01-10 NOTE — Unmapped (Signed)
Called and updated patient on COVID spike antibody test results and further discussed evusheld. Patient interested in injection.    Denies any other needs

## 2021-01-14 ENCOUNTER — Ambulatory Visit: Admit: 2021-01-14 | Discharge: 2021-01-15 | Payer: MEDICARE

## 2021-01-14 DIAGNOSIS — Z23 Encounter for immunization: Principal | ICD-10-CM

## 2021-01-14 DIAGNOSIS — D849 Immunodeficiency, unspecified: Principal | ICD-10-CM

## 2021-01-14 DIAGNOSIS — Z940 Kidney transplant status: Principal | ICD-10-CM

## 2021-01-14 DIAGNOSIS — B998 Other infectious disease: Principal | ICD-10-CM

## 2021-01-14 DIAGNOSIS — B999 Unspecified infectious disease: Principal | ICD-10-CM

## 2021-01-14 MED ADMIN — tixagevimab-cilgavimab 150 mg/1.5 mL- 150 mg/1.5 mL injection 3 mL: 3 mL | INTRAMUSCULAR | @ 15:00:00 | Stop: 2021-01-14

## 2021-01-14 NOTE — Unmapped (Signed)
[facility] Refill Coordination Note    Specialty Medication(s) to be Shipped:   Transplant: tacrolimus 1mg     Other medication(s) to be shipped: No additional medications requested for fill at this time     DERYL GIROUX, DOB: Apr 27, 1951  Phone: [phone] (home)       All above HIPAA information was verified with patient's caregiver, daughter     Was a translator used for this call? No    Completed refill call assessment today to schedule patient's medication shipment from the [facility] [phone]).       Specialty medication(s) and dose(s) confirmed: Regimen is correct and unchanged.   Changes to medications: Devan reports no changes at this time.  Changes to insurance: No  Questions for the pharmacist: No    Confirmed patient received Welcome Packet with first shipment. The patient will receive a drug information handout for each medication shipped and additional FDA Medication Guides as required.       DISEASE/MEDICATION-SPECIFIC INFORMATION        N/A    SPECIALTY MEDICATION ADHERENCE     Medication Adherence    Patient reported X missed doses in the last month: 0  Specialty Medication: tacrolimus 1mg   Adherence tools used: patient uses a pill box to manage medications, calendar                Tacrolimus 1mg   : 5 days of medicine on hand         SHIPPING     Shipping address confirmed in Epic.     Delivery Scheduled: Yes, Expected medication delivery date: 01/16/2021.     Medication will be delivered via UPS to the prescription address in Epic WAM.    [name]   [facility] Specialty Pharmacist

## 2021-01-14 NOTE — Unmapped (Signed)
Pt here for Evusheld injections. Education and handout provided to pt and questions answered. Signs and symptoms gone over with patient and they verbalized understanding. First injection given to right side gluteal muscle and second given to left side. Pt tolerated without difficulty. Pt in lobby for one hour observation.

## 2021-01-16 DIAGNOSIS — N289 Disorder of kidney and ureter, unspecified: Principal | ICD-10-CM

## 2021-01-16 DIAGNOSIS — Z940 Kidney transplant status: Principal | ICD-10-CM

## 2021-01-16 DIAGNOSIS — T8619 Other complication of kidney transplant: Principal | ICD-10-CM

## 2021-01-16 NOTE — Unmapped (Signed)
Patient called back after getting VM    Let him know that Dr Carlene Coria would like for him to get another MRI of his Kidney.  Patient in agreement with plan    Reports he sees ENT the week after next and is otherwise feeling good    Denies any other needs

## 2021-01-16 NOTE — Unmapped (Signed)
Per Dr True patient needs repeat MRI to evaluate lesion in transplanted kidney.    Called and left patient VM to call me

## 2021-01-17 DIAGNOSIS — T8619 Other complication of kidney transplant: Principal | ICD-10-CM

## 2021-01-17 DIAGNOSIS — Z940 Kidney transplant status: Principal | ICD-10-CM

## 2021-01-17 DIAGNOSIS — N289 Disorder of kidney and ureter, unspecified: Principal | ICD-10-CM

## 2021-01-27 ENCOUNTER — Ambulatory Visit
Admit: 2021-01-27 | Discharge: 2021-02-25 | Payer: MEDICARE | Attending: Speech-Language Pathologist | Primary: Speech-Language Pathologist

## 2021-01-27 ENCOUNTER — Ambulatory Visit
Admit: 2021-01-27 | Discharge: 2021-01-28 | Payer: MEDICARE | Attending: Student in an Organized Health Care Education/Training Program | Primary: Student in an Organized Health Care Education/Training Program

## 2021-01-27 DIAGNOSIS — R490 Dysphonia: Principal | ICD-10-CM

## 2021-01-27 DIAGNOSIS — Z940 Kidney transplant status: Principal | ICD-10-CM

## 2021-01-27 DIAGNOSIS — R059 Cough, unspecified: Secondary | ICD-10-CM | POA: Diagnosis not present

## 2021-01-27 NOTE — Unmapped (Signed)
Flexible endoscope serial number [id number] used today by Rupali Shah, MD

## 2021-01-27 NOTE — Unmapped (Signed)
Otolaryngology New Patient Consult Visit    Reason for visit:  Mr. Miguel Ward is seen in consultation at the request of True, Posey Boyer, MD for the evaluation of dysphonia    History of Present Illness:  [name] is a 70 y.o. year old male who presents with a 2 months history of dysphonia.    The patient reports his voice has become more weak and high pitched. He notes these changes have come on gradually, but have become very noticeable over the last 2 months. He states he has lost his lower register with speaking voice. 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 He denies SOB, dysphagia, odynophagia, fevers, and chills.     The patient also endorses a cough that began with an illness in October 2021. He states he developed a violent cough and was diagnosed with bronchitis. He was treated with Doxycycline. His cough has since been improved but he continues to have intermittent episodes of coughing fits. He no longer experiences the violent cough. The patient states he feels mucus in his throat at night when he lies down, which makes his cough worse. Other triggers include certain odors and air conditioning. These bring about a sensation of a tickle in his throat.    Currently on PPIs for GERD. Uses Flonase and a Netti pot as needed for post nasal drip.     Severity: moderate    Voice symptoms are described as weak, pitch breaks, rough  Singing voice symptoms include: pitch breaks    Associated symptoms:  Reflux symptoms include: cough  Swallowing symptoms include None  Airway symptoms include: None      Past Medical History:  Past Medical History:   Diagnosis Date   ??? Anemia    ??? CHF (congestive heart failure) (CMS-HCC)    ??? Coronary artery disease    ??? Disease of thyroid gland    ??? GERD (gastroesophageal reflux disease)    ??? Gout    ??? Hyperlipidemia    ??? Hypertension    ??? Lupus nephritis (CMS-HCC)    ??? NSTEMI (non-ST elevated myocardial infarction) (CMS-HCC)    ??? Red blood cell antibody positive     anti-K   ??? Renal cancer, right (CMS-HCC)    ??? Renal transplant, status post        Past Surgical History:  Past Surgical History:   Procedure Laterality Date   ??? AV FISTULA PLACEMENT Right    ??? HERNIA REPAIR     ??? PR BREATH HYDROGEN TEST N/A 06/24/2016    Procedure: BREATH HYDROGEN TEST;  Surgeon: Nurse-Based Giproc;  [facility];  Service: Gastroenterology   ??? PR COLONOSCOPY FLX DX W/COLLJ SPEC WHEN PFRMD N/A 05/22/2016    Procedure: COLONOSCOPY, FLEXIBLE, PROXIMAL TO SPLENIC FLEXURE; DIAGNOSTIC, W/WO COLLECTION SPECIMEN BY BRUSH OR WASH;  Surgeon: Liane Comber, MD;  Location: [facility];  Service: Gastroenterology   ??? PR LIGATN ANGIOACCESS AV FISTULA Right 10/21/2018    Procedure: LIGATION OR BANDING OF ANGIOACCESS ARTERIOVENOUS FISTULA, UPPER EXTREMITY;  Surgeon: Leona Carry, MD;  Location: MAIN OR [facility];  Service: Transplant   ??? PR NEPHRECTOMY Right 02/20/2014    Procedure: LAPAROSCOPY, SURGICAL; NEPHRECTOMY, INCLUDING PARTIAL URETERECTOMY;  Surgeon: Vivi Barrack, MD;  Location: MAIN OR [facility];  Service: Transplant   ??? PR REVISE AV FISTULA,W/O THROMBECTOMY Right 02/07/2019    Procedure: REVISON, OPEN, ARTERIOVENOUS FISTULA; WITHOUT THROMBECTOMY, AUTOGENOUS  OR NONAUTOGENOUS DIALYSIS GRAFT;  Surgeon: Leona Carry, MD;  Location: MA[facility] OR [facility];  Service: Transplant   ??? TRANSPLANTATION RENAL         Medications:    Current Outpatient Medications:   ???  acyclovir (ZOVIRAX) 400 MG tablet, Take 1 tablet (400 mg total) by mouth daily., Disp: 90 tablet, Rfl: 3  ???  allopurinoL (ZYLOPRIM) 100 MG tablet, Take 1 tablet (100 mg total) by mouth daily., Disp: 30 tablet, Rfl: 11  ???  aspirin (ASPIRIN LOW-STRENGTH) 81 MG chewable tablet, Chew 81 mg daily., Disp: , Rfl:   ???  atorvastatin (LIPITOR) 20 MG tablet, TAKE 1 TABLET BY MOUTH NIGHTLY., Disp: 30 tablet, Rfl: 11  ???  calcitrioL (ROCALTROL) 0.25 MCG capsule, Take 1 capsule (0.25 mcg total) by mouth every Monday, Wednesday, and Friday at 1600., Disp: 36 capsule, Rfl: 3  ???  colchicine (COLCRYS) 0.6 mg tablet, Take 1 tablet (0.6 mg total) by mouth daily as needed for up to 1 dose., Disp: 30 tablet, Rfl: 1  ???  enalapril (VASOTEC) 5 MG tablet, TAKE (1) TABLET BY MOUTH TWICE DAILY, Disp: 180 tablet, Rfl: 3  ???  ferrous sulfate 325 (65 FE) MG tablet, Take 1 tablet (325 mg total) by mouth daily., Disp: , Rfl: 0  ???  fluticasone (FLONASE) 50 mcg/actuation nasal spray, 1 spray into each nostril daily as needed., Disp: , Rfl:   ???  furosemide (LASIX) 20 MG tablet, Take 40 mg in the morning and 20 mg in the evening., Disp: 270 tablet, Rfl: 3  ???  levothyroxine (SYNTHROID) 200 MCG tablet, Take 1 tab with two tab, total dose daily, Disp: 90 tablet, Rfl: 3  ???  levothyroxine (SYNTHROID) 200 MCG tablet, TAKE ONE TABLET EVERY DAY WITH (2) FOR A TOTAL DAILY DOSE OF , Disp: 90 tablet, Rfl: 3  ???  levothyroxine (SYNTHROID) 25 MCG tablet, TAKE (2) TABLETS BY MOUTH EVERY DAY WITH1 TAB FOR A TOTAL DAILY DOSE OF , Disp: 180 tablet, Rfl: 3  ???  magnesium oxide (MAG-OX) 400 mg (241.3 mg elemental magnesium) tablet, Take 1 tablet (400 mg total) by mouth Two (2) times a day., Disp: 60 tablet, Rfl: 11  ???  metoprolol succinate (TOPROL-XL) 100 MG 24 hr tablet, Take 1 tablet (100 mg total) by mouth daily., Disp: 90 tablet, Rfl: 3  ???  mycophenolate (CELLCEPT) 250 mg capsule, Take 1 capsule (250 mg total) by mouth Two (2) times a day., Disp: 60 capsule, Rfl: 11  ???  omeprazole (PRILOSEC) 20 MG capsule, Take 1 capsule (20 mg total) by mouth daily., Disp: 90 capsule, Rfl: 3  ???  predniSONE (DELTASONE) 5 MG tablet, Take 1 tablet (5 mg total) by mouth daily., Disp: 90 tablet, Rfl: 3  ???  simethicone (GAS-X) 80 MG chewable tablet, Chew 1 tablet (80 mg total) every six (6) hours as needed for flatulence., Disp: 30 tablet, Rfl: 1  ???  tacrolimus (PROGRAF) 1 MG capsule, Take 3 capsules (3 mg total) by mouth every morning AND 2 capsules (2 mg total) nightly., Disp: 150 capsule, Rfl: 11     Allergies:  Allergies   Allergen Reactions   ??? Penicillins Hives     Other reaction(s): HIVES       Family History:  Family History   Problem Relation Age of Onset   ??? Heart disease Mother    ??? Diabetes Mother    ??? Hypertension Mother    ??? Cancer Father    ???  Alcohol abuse Brother    ??? Cardiomyopathy Neg Hx    ??? Sudden Cardiac Death Neg Hx    ??? Melanoma Neg Hx    ??? Basal cell carcinoma Neg Hx    ??? Squamous cell carcinoma Neg Hx    ??? Anesthesia problems Neg Hx        Social History:  Social History     Tobacco Use   ??? Smoking status: Never Smoker   ??? Smokeless tobacco: Never Used   Substance Use Topics   ??? Alcohol use: No     Alcohol/week: 0.0 standard drinks   ??? Drug use: No       Review of Systems:  The balance of 11 systems were reviewed and are negative except as noted in HPI or on intake form.     Physical Exam:   Constitutional:  Vitals reviewed in chart, patient has normal appearance. Well nourished, well-developed, no acute distress  Voice:  Asthenic   Respiration:  Breathing comfortably, no stridor.  CV: No clubbing/cyanosis/edema in hands.   Eyes:  extraocular motion intact, sclera normal.   Neuro:  Alert and oriented times 3, Cranial nerves 2-12 intact and symmetric bilaterally.   Head and Face:  Skin with no masses or lesions, sinuses nontender to palpation, facial nerve fully intact.   Salivary Glands:  Parotid and submandibular glands normal bilaterally.   Ears:  Normal tympanic membranes to otoscopy, normal hearing to whispered voice.   Nose:  External nose midline, anterior rhinoscopy is normal with limited visualization just to the anterior interior turbinate.   Oral Cavity/Oropharynx/Lips:  Normal mucous membranes, normal floor of mouth/tongue/oropharynx, no masses or lesions are noted.  Good dentition  Pharyngeal Walls:  No masses noted.  Neck/Lymph:  No lymphadenopathy, no thyroid masses.  Larynx: Mirror evaluation insufficient to visualize secondary to prominent gag      Procedure Note    Endoscopy Type:  Flexible Fiberoptic Videostroboscopy    Indications/TimeOut:  To better evaluate the patient???s symptoms, fiberoptic videostroboscopy is indicated.   A time out identifying the patient, the procedure, the location of the procedure and any concerns was performed prior to beginning the procedure.    Procedure Details:    The patient was placed in the sitting position.  After topical anesthesia and decongestion with oxymetazoline and 1% lidocaine, the flexible laryngoscope was passed.  The microphone was used to trigger the xenon stroboscopic light source.    -  Nasal cavity  - There was no pus or polyps noted in the nasal cavity. Septum intact  -  Nasopharynx/Oropharynx - there was no evidence of mass or lesion.  -  Hypopharynx - There were no lesions in the pyriformis, pharyngeal walls, or postcricoid space  -  Larynx -  there was mild interarytenoid edema, no erythema. No lesions of the epiglottis, false folds, or aryepiglottic folds  -  Vocal Folds -     Mobility: Normal bilaterally   Amplitude: Right greater than Left   Mucosal Wave: Left mucosal wave diminished   Closure: Complete   Lesions/Other findings: muscle tension dysphonia  and left vocal scar  - Subglottis - Normal    Condition:  Stable.  Patient tolerated procedure well.    Complications:  None      Voice- Related Quality of Life (VR-QOL) Measure:  I have trouble speaking loudly or being heard in noisy situations: 5  I run out of air and need to take frequent breaths when talking: 1  I  sometimes do not know what will come out when I begin speaking: 1  I am sometimes anxious or frustrated because of my voice: 1  I sometimes get depressed because of my voice: 1  I have trouble using the telephone because of my voice: 2  I have trouble doing my job or practicing my profession because of my voice: 4  I avoid going out socially because of my voice: 1  I have to repeat myself to be understood: 2  I have become less outgoing because of my voice: 1  VRQOL Raw Score: 19  Calculated Score: 77.5    Glottal Function Index:  Speaking took extra effort: 5  Throat discomfort or pain after using your voice: 1  Vocal fatigue (voice weakened as you talked): 4  Voice cracks or sounds different: 5  Glottal Function Index Total: 15      Imaging/tests results: None    Voice Evaluation:   A Speech Language Pathology evaluation was ordered and deemed necessary and standard of care.     Assessment:   Mr.Levin CHROSTOPHER TELLMAN is a 70 y.o. year old male who presents today with symptoms of dysphonia    Findings and diagnoses include:  Dysphonia  Left vocal scar  MTD  VCD type cough  Hx of lupus nephritis and renal cell carcinoma s/p transplant      Plan:  - Speech therapy which is necessary and standard of care for cough suppression therapy, respiratory retraing and voice.  - Follow-up with me in 3-4 months    Scribe's Attestation: Odetta Forness N. Sherryll Burger, MD obtained and performed the history, physical exam and medical decision making elements that were entered into the chart. Signed by Loreen Freud, Scribe, on January 27, 2021 at 10:18 AM.        The documentation recorded by the scribe accurately reflects the service I personally performed and the decisions made by me. Christene Lye, MD

## 2021-01-27 NOTE — Unmapped (Signed)
[facility]- What is Voice Therapy?    Voice disorders may be managed in several different ways, including: medical management (further testing and medications), surgery, and voice therapy.  Surgery is usually considered when voice therapy will not help the particular voice disorder, or when improvement via voice therapy plateaus. Often, a combination of these treatments may be necessary.  Voice therapy is a patient- centered method of treatment aimed at modifying specific behaviors that contribute to voice disorders or voice misuse, or that in some way limit the functional, daily use of your voice.  It???s like physical therapy for your voice box.    Voice therapy has several components:  Applying principles of vocal hygiene to your daily voice use  Practicing therapeutic exercises designed to change the way your vocal folds vibrate  Developing vocal awareness training, called auditory training, to help you develop the ability to differentiate between your new and your old voice  Practicing your new voice in everyday speech     Who is a candidate?  Most of [place] depend on our voices to make a living.  We often take our voices for granted until they give [place] a problem.  Individuals whose livelihood is dependent upon having an optimal voice are often called ???professional voice users.???  Although we think of professional singers and actors as the elite voice users, laryngitis can put a Runner, broadcasting/film/video, salesperson, Optician, dispensing, Emergency planning/management officer, Diplomatic Services operational officer, Clinical research associate, Advertising account planner, among others, out of commission.      How many therapy appointments will I need?   Usually, your physician and voice therapist will initially recommend that you return one time per week for approximately two to three weeks. Following the initial phase of therapy, your therapist may recommend less frequent appointments over a period of 2-3 months, depending upon your needs and schedule. Voice therapy is very individualized and follow-up care is tailored to meet your individual voice needs.    What is my role in voice therapy?   Your role in the voice therapy process is very important. Voice therapy, like physical therapy or any other behavioral treatment approach aimed at changing the way in which muscles are used, can be successful when there is commitment to the therapy process.  Therapy is not a ???quick fix.??? It takes time to change muscle patterns, muscle memory and your vocal behaviors.     For Further Information or Scheduling  Call [phone] to schedule an appointment or for additional information.

## 2021-01-28 NOTE — Unmapped (Signed)
[facility] SPEECH [place] [address]  OUTPATIENT SPEECH PATHOLOGY  01/27/2021      Patient Name: Miguel Ward  Date of Birth:1951/01/07  Session Number: 1  Diagnosis:   Encounter Diagnoses   Name Primary?   ??? Dysphonia Yes   ??? Cough         Date of Evaluation: 01/27/21  Date of Symptom Onset: 12/13/20  Referred by: Dr. [name]   Reason for Referral: Evaluation Speech, Language, Voice, Cognition              ASSESSMENT:  Pt presents with moderate dysphonia and chronic cough secondary to Pt's hx of bronchitis.    Characterized by: glottal fry,hoarseness,breathiness,strained voice,roughness,hyponasality,harshness  Therapy Techniques utilized during trial/diagnostic therapy:: Respiratory retraining,Resonant voice therapy       Stimulability: Pt was very stimulable     Treatment Recommendations: Initiate Treatment            PLAN:  SLP Follow-up / Frequency: 2x per month for Planned Treatment Duration : 1 45 min tx session per week-every other week x9 over 3 months    Planned Interventions: Vocal Function Exercises,Respiratory Muscle Therapy (RMT),Resonant Voice Therapy,Cough therapy       Prognosis:  Good    Negative Prognosis Rationale: Time post onset,Severity of deficits       Positive Prognosis Rationale: Behavior,Motivation,Response to trial treatment      Goals:  Patient and Family Goals: To improve vocal quality.     STG 1: Breathing: Pt will demonstrate abdominal breathing for support of phonation with 80% accuracy given minimal cues    Time Frame: 3  Duration: months    STG 2: Resonance: Pt will balance oral/nasal resonance to maximize vocal output with minimal effort/laryngeal hyperfunction with 80% accuracy given minimal cues                  STG 3: Strength/balance: Pt will strengthen and balance laryngeal musculature for support of connected speech with 80% accuracy given minimal cues                 STG 4: Inflection: Pt. will increase inflection, improve phonatory flexibility, and increase laryngeal control without laryngeal hyperfunction with 80% accuracy given minimal cues                 STG 5: VCD/Cough: Pt will minimize or eliminate cough and/or Vocal Cord Dysfunction by implementing learned strategies with 100% accuracy given minimal cues                          LTG #1: Pt will report improved functional voice for ADL's without the following symptoms: chronic hoarseness, vocal fatigue, pain with extended voice use, decreased volume, and intermittent voice loss (+)        Time Frame: 3  Duration: months    LTG #2: Pt will report employment of VCD strategies in order to minimize chronic cough, vocal cord dysfunction, and/or laryngeal spasm                         SUBJECTIVE:  Miguel Ward is a 70 y.o. male being seen in consultation with Dr. [name] for a voice evaluation secondary to Pt's 2 month hx of dysphonia. Pt expresses his voice has become more weak and a higher pitch, which occurred gradually, however has become more noticeable over the last 2 months. Pt expresses he has lost his lower register  with his speaking voice. 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 Pt also endorses a cough initiating with an illness in October 2021, in which developed into a violent cough and diagnoses with bronchitis. Ultimately, Pt was treated with Doxycycline which improved Pt's cough, but he continues to have intermittent coughing episodes. Pt expresses he feels mucus in his throat at night when he lies down, which makes his cough worse. Other coughing triggers include specific odors and air conditioning.    Communication Preference: Verbal,Written,Visual         Barriers to Learning: No Barriers   Hearing Exceptions: No hearing aid                                                 Prior treatment for referral reason: No                  Pain?: No      Precautions: None         Prior Function: Independent                                Vocational: Retired  Lives With: Spouse,Family    Past Medical History:   Diagnosis Date   ??? Anemia    ??? CHF (congestive heart failure) (CMS-HCC)    ??? Coronary artery disease    ??? Disease of thyroid gland    ??? GERD (gastroesophageal reflux disease)    ??? Gout    ??? Hyperlipidemia    ??? Hypertension    ??? Lupus nephritis (CMS-HCC)    ??? NSTEMI (non-ST elevated myocardial infarction) (CMS-HCC)    ??? Red blood cell antibody positive     anti-K   ??? Renal cancer, right (CMS-HCC)    ??? Renal transplant, status post       Family History   Problem Relation Age of Onset   ??? Heart disease Mother    ??? Diabetes Mother    ??? Hypertension Mother    ??? Cancer Father    ??? Alcohol abuse Brother    ??? Cardiomyopathy Neg Hx    ??? Sudden Cardiac Death Neg Hx    ??? Melanoma Neg Hx    ??? Basal cell carcinoma Neg Hx    ??? Squamous cell carcinoma Neg Hx    ??? Anesthesia problems Neg Hx      Past Surgical History:   Procedure Laterality Date   ??? AV FISTULA PLACEMENT Right    ??? HERNIA REPAIR     ??? PR BREATH HYDROGEN TEST N/A 06/24/2016    Procedure: BREATH HYDROGEN TEST;  Surgeon: Nurse-Based Giproc;  [facility]: [facility];  Service: Gastroenterology   ??? PR COLONOSCOPY FLX DX W/COLLJ SPEC WHEN PFRMD N/A 05/22/2016    Procedure: COLONOSCOPY, FLEXIBLE, PROXIMAL TO SPLENIC FLEXURE; DIAGNOSTIC, W/WO COLLECTION SPECIMEN BY BRUSH OR WASH;  Surgeon: Liane Comber, MD;  Location: [facility];  Service: Gastroenterology   ??? PR LIGATN ANGIOACCESS AV FISTULA Right 10/21/2018    Procedure: LIGATION OR BANDING OF ANGIOACCESS ARTERIOVENOUS FISTULA, UPPER EXTREMITY;  Surgeon: Leona Carry, MD;  Location: MAIN OR [facility];  Service: Transplant   ??? PR NEPHRECTOMY Right 02/20/2014    Procedure: LAPAROSCOPY, SURGICAL; NEPHRECTOMY, INCLUDING PARTIAL URETERECTOMY;  Surgeon:  Vivi Barrack, MD;  Location: [facility];  Service: Transplant   ??? PR REVISE AV FISTULA,W/O THROMBECTOMY Right 02/07/2019    Procedure: REVISON, OPEN, ARTERIOVENOUS FISTULA; WITHOUT THROMBECTOMY, AUTOGENOUS OR NONAUTOGENOUS DIALYSIS GRAFT;  Surgeon: Leona Carry, MD;  Location: MAIN OR [facility];  Service: Transplant   ??? TRANSPLANTATION RENAL        Allergies   Allergen Reactions   ??? Penicillins Hives     Other reaction(s): HIVES     Social History     Tobacco Use   ??? Smoking status: Never Smoker   ??? Smokeless tobacco: Never Used   Substance Use Topics   ??? Alcohol use: No     Alcohol/week: 0.0 standard drinks      Current Outpatient Medications   Medication Sig Dispense Refill   ??? acyclovir (ZOVIRAX) 400 MG tablet Take 1 tablet (400 mg total) by mouth daily. 90 tablet 3   ??? allopurinoL (ZYLOPRIM) 100 MG tablet Take 1 tablet (100 mg total) by mouth daily. 30 tablet 11   ??? aspirin (ASPIRIN LOW-STRENGTH) 81 MG chewable tablet Chew 81 mg daily.     ??? atorvastatin (LIPITOR) 20 MG tablet TAKE 1 TABLET BY MOUTH NIGHTLY. 30 tablet 11   ??? calcitrioL (ROCALTROL) 0.25 MCG capsule Take 1 capsule (0.25 mcg total) by mouth every Monday, Wednesday, and Friday at 1600. 36 capsule 3   ??? colchicine (COLCRYS) 0.6 mg tablet Take 1 tablet (0.6 mg total) by mouth daily as needed for up to 1 dose. 30 tablet 1   ??? enalapril (VASOTEC) 5 MG tablet TAKE (1) TABLET BY MOUTH TWICE DAILY 180 tablet 3   ??? ferrous sulfate 325 (65 FE) MG tablet Take 1 tablet (325 mg total) by mouth daily.  0   ??? fluticasone (FLONASE) 50 mcg/actuation nasal spray 1 spray into each nostril daily as needed.     ??? furosemide (LASIX) 20 MG tablet Take 40 mg in the morning and 20 mg in the evening. 270 tablet 3   ??? levothyroxine (SYNTHROID) 200 MCG tablet Take 1 tab with two tab, total dose daily 90 tablet 3   ??? levothyroxine (SYNTHROID) 200 MCG tablet TAKE ONE TABLET EVERY DAY WITH (2) FOR A TOTAL DAILY DOSE OF 90 tablet 3   ??? levothyroxine (SYNTHROID) 25 MCG tablet TAKE (2) TABLETS BY MOUTH EVERY DAY WITH1 TAB FOR A TOTAL DAILY DOSE OF 180 tablet 3   ??? magnesium oxide (MAG-OX) 400 mg (241.3 mg elemental magnesium) tablet Take 1 tablet (400 mg total) by mouth Two (2) times a day. 60 tablet 11   ??? metoprolol succinate (TOPROL-XL) 100 MG 24 hr tablet Take 1 tablet (100 mg total) by mouth daily. 90 tablet 3   ??? mycophenolate (CELLCEPT) 250 mg capsule Take 1 capsule (250 mg total) by mouth Two (2) times a day. 60 capsule 11   ??? omeprazole (PRILOSEC) 20 MG capsule Take 1 capsule (20 mg total) by mouth daily. 90 capsule 3   ??? predniSONE (DELTASONE) 5 MG tablet Take 1 tablet (5 mg total) by mouth daily. 90 tablet 3   ??? simethicone (GAS-X) 80 MG chewable tablet Chew 1 tablet (80 mg total) every six (6) hours as needed for flatulence. 30 tablet 1   ??? tacrolimus (PROGRAF) 1 MG capsule Take 3 capsules (3 mg total) by mouth every morning AND 2 capsules (2 mg total) nightly. 150 capsule 11     No current facility-administered medications for  this visit.         OBJECTIVE  Voice - General   Hydration: 5-6 glasses of water per day  Caffeine Use: N/A  Onset: Sudden  Daily Voice Use: Moderate demand   Acoustic Measures  Connected Sample: CAPE-V Sentences  Connected Sample 1 (Hz): 150.74 Hz   Total Fundamental Frequency  Total Fundamental Frequency Range (Hz): 114.8 Hz  to (Hz): 258.09 Hz   Sustained Phonation  Mean Fund Freq (Hz): 149.69 Hz  Relative Average Perturbation (RAP) %: 0.68 %  Noise-to-Harmonic Ratio (NHR): 0.16  Degree of Subharmonics (DSH) %: 0 %  Degree of Voiceless (DUV) %: 0 %  Soft Phonation Index (SPI) : 21.67   Aerodynamic Measures  Maximum phonation time on /a/ in sec:: 7.6 sec  Mean Flow Rate (mL/sec): 170 mL/sec  at (Hz): 192.05 Hz  and (dB): 88.78 dB  Objective and/or perceptual measures suggest:: Weak voice,Hypofunctional pattern  Pulmonary Breath Support: None  Primary Breath Support: Thoracic,Diaphragmatic  Speaks on functional residual capacity?: No       Perceptual Measures  Grade:: 2  Rough:: 1  Breathy:: 2  Asthenic:: 1  Strained:: 1  Total:: 7 /15   Tremor / Rate of Speech  Tremor:: Absent  Rate of Speech:: WFL   Subjective Measures  Glottal Function Index (GFI):: 15  Voice-related Quality of Life (V-RQOL):: 77.5    Session Duration : 25    Today's Charges (noted here with $$):     SLP Evaluation Charges  $$ Voice Quality Evaluation [mins]: 15  $$ Laryngeal Function Study - Voice Ctr only [mins]: 10                I attest that I have reviewed the above information.  Signed: Genene Churn, SLP  01/27/2021 10:36 AM     I was physically present and immediately available to direct and supervise tasks that were related to patient management by Graduate Student Intern, Leota Jacobsen, BS. The direction and supervision was continuous throughout the time these tasks were performed.     Milton Ferguson, [place], [facility] -SLP    Patient removed their mask for ~2 minute(s) to complete diagnostic or therapeutic tasks. SLP and student wore mask and eye protection throughout session.

## 2021-01-30 ENCOUNTER — Ambulatory Visit: Admit: 2021-01-30 | Discharge: 2021-01-31 | Payer: MEDICARE

## 2021-01-30 DIAGNOSIS — N289 Disorder of kidney and ureter, unspecified: Secondary | ICD-10-CM | POA: Diagnosis not present

## 2021-01-30 DIAGNOSIS — Q6102 Congenital multiple renal cysts: Secondary | ICD-10-CM | POA: Diagnosis not present

## 2021-01-30 DIAGNOSIS — Y830 Surgical operation with transplant of whole organ as the cause of abnormal reaction of the patient, or of later complication, without mention of misadventure at the time of the procedure: Secondary | ICD-10-CM | POA: Diagnosis not present

## 2021-01-30 DIAGNOSIS — T8619 Other complication of kidney transplant: Secondary | ICD-10-CM | POA: Diagnosis not present

## 2021-01-30 DIAGNOSIS — N281 Cyst of kidney, acquired: Secondary | ICD-10-CM | POA: Diagnosis not present

## 2021-01-30 DIAGNOSIS — K769 Liver disease, unspecified: Secondary | ICD-10-CM | POA: Diagnosis not present

## 2021-01-30 MED ADMIN — gadobenate dimeglumine (MULTIHANCE) 529 mg/mL (0.1mmol/0.2mL) solution 8 mL: 8 mL | INTRAVENOUS | @ 16:00:00 | Stop: 2021-01-30

## 2021-01-31 DIAGNOSIS — E039 Hypothyroidism, unspecified: Secondary | ICD-10-CM | POA: Diagnosis not present

## 2021-01-31 DIAGNOSIS — J309 Allergic rhinitis, unspecified: Secondary | ICD-10-CM | POA: Diagnosis not present

## 2021-01-31 DIAGNOSIS — E785 Hyperlipidemia, unspecified: Secondary | ICD-10-CM | POA: Diagnosis not present

## 2021-01-31 DIAGNOSIS — I499 Cardiac arrhythmia, unspecified: Secondary | ICD-10-CM | POA: Diagnosis not present

## 2021-01-31 DIAGNOSIS — D84821 Immunodeficiency due to drugs: Secondary | ICD-10-CM | POA: Diagnosis not present

## 2021-01-31 DIAGNOSIS — D649 Anemia, unspecified: Secondary | ICD-10-CM | POA: Diagnosis not present

## 2021-01-31 DIAGNOSIS — I509 Heart failure, unspecified: Secondary | ICD-10-CM | POA: Diagnosis not present

## 2021-01-31 DIAGNOSIS — I739 Peripheral vascular disease, unspecified: Secondary | ICD-10-CM | POA: Diagnosis not present

## 2021-01-31 DIAGNOSIS — I110 Hypertensive heart disease with heart failure: Secondary | ICD-10-CM | POA: Diagnosis not present

## 2021-01-31 DIAGNOSIS — E261 Secondary hyperaldosteronism: Secondary | ICD-10-CM | POA: Diagnosis not present

## 2021-01-31 DIAGNOSIS — Z008 Encounter for other general examination: Secondary | ICD-10-CM | POA: Diagnosis not present

## 2021-02-04 ENCOUNTER — Encounter: Admit: 2021-02-04 | Discharge: 2021-02-04 | Payer: MEDICARE | Attending: Anesthesiology | Primary: Anesthesiology

## 2021-02-04 ENCOUNTER — Ambulatory Visit: Admit: 2021-02-04 | Discharge: 2021-02-04 | Payer: MEDICARE

## 2021-02-04 DIAGNOSIS — Z940 Kidney transplant status: Secondary | ICD-10-CM | POA: Diagnosis not present

## 2021-02-04 DIAGNOSIS — E785 Hyperlipidemia, unspecified: Secondary | ICD-10-CM | POA: Diagnosis not present

## 2021-02-04 DIAGNOSIS — K5140 Inflammatory polyps of colon without complications: Secondary | ICD-10-CM | POA: Diagnosis not present

## 2021-02-04 DIAGNOSIS — I110 Hypertensive heart disease with heart failure: Secondary | ICD-10-CM | POA: Diagnosis not present

## 2021-02-04 DIAGNOSIS — D125 Benign neoplasm of sigmoid colon: Secondary | ICD-10-CM | POA: Diagnosis not present

## 2021-02-04 DIAGNOSIS — K5730 Diverticulosis of large intestine without perforation or abscess without bleeding: Secondary | ICD-10-CM | POA: Diagnosis not present

## 2021-02-04 DIAGNOSIS — K635 Polyp of colon: Secondary | ICD-10-CM | POA: Diagnosis not present

## 2021-02-04 DIAGNOSIS — K219 Gastro-esophageal reflux disease without esophagitis: Secondary | ICD-10-CM | POA: Diagnosis not present

## 2021-02-04 DIAGNOSIS — D123 Benign neoplasm of transverse colon: Secondary | ICD-10-CM | POA: Diagnosis not present

## 2021-02-04 DIAGNOSIS — I5042 Chronic combined systolic (congestive) and diastolic (congestive) heart failure: Secondary | ICD-10-CM | POA: Diagnosis not present

## 2021-02-04 DIAGNOSIS — I4891 Unspecified atrial fibrillation: Secondary | ICD-10-CM | POA: Diagnosis not present

## 2021-02-04 DIAGNOSIS — I2510 Atherosclerotic heart disease of native coronary artery without angina pectoris: Secondary | ICD-10-CM | POA: Diagnosis not present

## 2021-02-04 DIAGNOSIS — K625 Hemorrhage of anus and rectum: Secondary | ICD-10-CM | POA: Diagnosis not present

## 2021-02-04 MED ADMIN — propofoL (DIPRIVAN) injection: INTRAVENOUS | @ 14:00:00 | Stop: 2021-02-04

## 2021-02-04 MED ADMIN — propofol (DIPRIVAN) infusion 10 mg/mL: INTRAVENOUS | @ 14:00:00 | Stop: 2021-02-04

## 2021-02-06 NOTE — Unmapped (Signed)
[facility] Refill Coordination Note    Specialty Medication(s) to be Shipped:   Transplant: tacrolimus 1mg     Other medication(s) to be shipped: No additional medications requested for fill at this time     Miguel Ward, DOB: 1950-12-17  Phone: [phone] (home)       All above HIPAA information was verified with patient.     Was a Nurse, learning disability used for this call? No    Completed refill call assessment today to schedule patient's medication shipment from the [facility] [phone]).       Specialty medication(s) and dose(s) confirmed: Regimen is correct and unchanged.   Changes to medications: Alias reports no changes at this time.  Changes to insurance: No  Questions for the pharmacist: No    Confirmed patient received Welcome Packet with first shipment. The patient will receive a drug information handout for each medication shipped and additional FDA Medication Guides as required.       DISEASE/MEDICATION-SPECIFIC INFORMATION        N/A    SPECIALTY MEDICATION ADHERENCE     Medication Adherence    Patient reported X missed doses in the last month: 0  Specialty Medication: Tacrolimus 1mg   Patient is on additional specialty medications: No  Adherence tools used: patient uses a pill box to manage medications, calendar                Tacrolimus 1mg : 14 days worth of medication on hand.          SHIPPING     Shipping address confirmed in Epic.     Delivery Scheduled: Yes, Expected medication delivery date: 02/13/21.     Medication will be delivered via UPS to the prescription address in Epic WAM.    Miguel Ward   [facility] Specialty Technician

## 2021-02-10 DIAGNOSIS — N289 Disorder of kidney and ureter, unspecified: Principal | ICD-10-CM

## 2021-02-10 DIAGNOSIS — T8619 Other complication of kidney transplant: Principal | ICD-10-CM

## 2021-02-12 MED FILL — TACROLIMUS 1 MG CAPSULE, IMMEDIATE-RELEASE: ORAL | 30 days supply | Qty: 150 | Fill #1

## 2021-02-13 MED ORDER — ENALAPRIL MALEATE 5 MG TABLET
ORAL_TABLET | 3 refills | 0 days
Start: 2021-02-13 — End: ?

## 2021-03-12 NOTE — Unmapped (Signed)
[facility] Refill Coordination Note    Specialty Medication(s) to be Shipped:   Transplant: tacrolimus 1mg     Other medication(s) to be shipped: No additional medications requested for fill at this time     Miguel Ward, DOB: February 05, 1951  Phone: [phone] (home)       All above HIPAA information was verified with patient.     Was a Nurse, learning disability used for this call? No    Completed refill call assessment today to schedule patient's medication shipment from the [facility] [phone]).       Specialty medication(s) and dose(s) confirmed: Regimen is correct and unchanged.   Changes to medications: Taegan reports no changes at this time.  Changes to insurance: No  Questions for the pharmacist: No    Confirmed patient received a Conservation officer, historic buildings and a Surveyor, mining with first shipment. The patient will receive a drug information handout for each medication shipped and additional FDA Medication Guides as required.       DISEASE/MEDICATION-SPECIFIC INFORMATION        N/A    SPECIALTY MEDICATION ADHERENCE     Medication Adherence    Patient reported X missed doses in the last month: 0  Specialty Medication: Tacrolimus 1mg   Patient is on additional specialty medications: No  Adherence tools used: patient uses a pill box to manage medications, calendar                Tacrolimus 1 mg: 10 days of medicine on hand *    SHIPPING     Shipping address confirmed in Epic.     Delivery Scheduled: Yes, Expected medication delivery date: 03/19/21.     Medication will be delivered via UPS to the prescription address in Epic WAM.    [name]   [facility] Specialty Pharmacist

## 2021-03-18 MED FILL — TACROLIMUS 1 MG CAPSULE, IMMEDIATE-RELEASE: ORAL | 30 days supply | Qty: 150 | Fill #2

## 2021-03-21 ENCOUNTER — Ambulatory Visit: Admit: 2021-03-21 | Discharge: 2021-03-22 | Payer: MEDICARE

## 2021-03-21 DIAGNOSIS — N281 Cyst of kidney, acquired: Secondary | ICD-10-CM | POA: Diagnosis not present

## 2021-03-21 DIAGNOSIS — Z940 Kidney transplant status: Secondary | ICD-10-CM | POA: Diagnosis not present

## 2021-03-28 MED ORDER — CALCITRIOL 0.25 MCG CAPSULE
ORAL_CAPSULE | ORAL | 3 refills | 0.00000 days | Status: CP
Start: 2021-03-28 — End: ?

## 2021-03-31 MED ORDER — CALCITRIOL 0.25 MCG CAPSULE
ORAL_CAPSULE | ORAL | 3 refills | 84 days
Start: 2021-03-31 — End: ?

## 2021-04-02 ENCOUNTER — Ambulatory Visit: Admit: 2021-04-02 | Discharge: 2021-04-03 | Payer: MEDICARE

## 2021-04-02 DIAGNOSIS — N289 Disorder of kidney and ureter, unspecified: Secondary | ICD-10-CM | POA: Diagnosis not present

## 2021-04-02 DIAGNOSIS — T8619 Other complication of kidney transplant: Secondary | ICD-10-CM | POA: Diagnosis not present

## 2021-04-02 DIAGNOSIS — Y830 Surgical operation with transplant of whole organ as the cause of abnormal reaction of the patient, or of later complication, without mention of misadventure at the time of the procedure: Secondary | ICD-10-CM | POA: Diagnosis not present

## 2021-04-02 MED ADMIN — sulfur hexafluoride microsphreres (LUMASON) intravenous suspension 2 mL: 2 mL | INTRAVENOUS | @ 15:00:00 | Stop: 2021-04-02

## 2021-04-02 MED ADMIN — sulfur hexafluoride microsphreres (LUMASON) intravenous suspension 1 mL: 1 mL | INTRAVENOUS | @ 15:00:00 | Stop: 2021-04-02

## 2021-04-07 ENCOUNTER — Telehealth
Admit: 2021-04-07 | Discharge: 2021-05-06 | Payer: MEDICARE | Attending: Speech-Language Pathologist | Primary: Speech-Language Pathologist

## 2021-04-07 DIAGNOSIS — R490 Dysphonia: Secondary | ICD-10-CM | POA: Diagnosis not present

## 2021-04-07 DIAGNOSIS — R059 Cough, unspecified: Secondary | ICD-10-CM | POA: Diagnosis not present

## 2021-04-08 NOTE — Unmapped (Signed)
Per dr true patient to be presented at tumor board. May need ablation    Called and updated patient on RUS results, tumor board meeting and that we would let him know after meeting what the next steps are.    He verbalized understanding and denies any other needs

## 2021-04-09 NOTE — Unmapped (Signed)
[facility] SPEECH [place] [address]  OUTPATIENT SPEECH PATHOLOGY  04/07/2021      Patient Name: Miguel Ward  Date of Birth:1951/07/15  Session Number: 2  Diagnosis:   Encounter Diagnoses   Name Primary?   ??? Cough Yes   ??? Dysphonia            Date of Evaluation: 01/27/21  Date of Symptom Onset: 12/13/20  Referred by: Dr. [name]                   Note Type: Treatment Note    ASSESSMENT:     Next Visit Plan: review HEP, cough therapy, resonant voice exercises    OBJECTIVE:  Pt seen for initiation of voice therapy. Reviewed Pt's Dx, established Pt's and SLP LTG's and STG's for therapy.  Introduced relaxation regime, reviewed vocal hygiene, instructed abdominal breathing and support for phonation, and taught resonant voice therapy concepts. Pt was 80% accurate for engaging lower abdominal muscles to support breath flow during phonation given mod cues and modeling.Pt was 60% accurate for /v,z,w/ words and /m/ words, phrases, and sentences in chant and connected speech given mod cues and modeling.Pt was 60% accurate for balanced phonation in lip trills given mod cues for airflow and resonance shape. Pt participated well in therapy with high level of motivation apparent and good insight with examples of improved voice in therapy tasks targeting abdominal breathing and resonant voice. Based on performance today, Px for improvement in Tx is good.    Stimulability: Pt was very stimulable  Treatment Recommendations: Continue Treatment    PLAN:  SLP Follow-up / Frequency: 2x per month for Planned Treatment Duration : 1 45 min tx session per week-every other week x9 over 3 months      Planned Interventions: Vocal Function Exercises, Respiratory Muscle Therapy (RMT), Resonant Voice Therapy, Cough therapy Cough therapy, Semi-occluded Vocal Tract Exercises, Financial controller, Functional Phonation Tasks    Prognosis:  Good    Negative Prognosis Rationale: Time post onset, Severity of deficits       Positive Prognosis Rationale: Behavior, Motivation, Response to trial treatment      Goals:  Patient and Family Goals: To improve vocal quality.     STG 1: Breathing: Pt will demonstrate abdominal breathing for support of phonation with 80% accuracy given minimal cues    STG 2: Resonance: Pt will balance oral/nasal resonance to maximize vocal output with minimal effort/laryngeal hyperfunction with 80% accuracy given minimal cues    STG 3: Strength/balance: Pt will strengthen and balance laryngeal musculature for support of connected speech with 80% accuracy given minimal cues    STG 4: Inflection: Pt. will increase inflection, improve phonatory flexibility, and increase laryngeal control without laryngeal hyperfunction with 80% accuracy given minimal cues    STG 5: VCD/Cough: Pt will minimize or eliminate cough and/or Vocal Cord Dysfunction by implementing learned strategies with 100% accuracy given minimal cues                   Time Frame: 3  Duration: months    LTG #1: Pt will report improved functional voice for ADL's without the following symptoms: chronic hoarseness, vocal fatigue, pain with extended voice use, decreased volume, and intermittent voice loss (+)    LTG #2: Pt will report employment of VCD strategies in order to minimize chronic cough, vocal cord dysfunction, and/or laryngeal spasm  Time Frame: 3  Duration: months       SUBJECTIVE:  States he is having difficulty singing and speaking at church.  Pain?: No      Precautions: None      Education Provided: Patient, SLP Plan of Care, Importance of Therapy, Strategies to maximize voicing    Response to Education: Understanding verbalized     Communication/Consultation: n/a    Session Duration : 45    Today's Charges (noted here with $$):  SLP Treatment Charges  $$ Treatment S/L/V - individual [mins]: 45                   I attest that I have reviewed the above information.  Signed: Genene Churn, SLP    I was physically present and immediately available to direct and supervise tasks that were related to patient management by Graduate Student Intern, Leota Jacobsen, BS. The direction and supervision was continuous throughout the time these tasks were performed.     Milton Ferguson, [place], [facility] -SLP        The patient reports they are currently: at home. I spent 45 minutes on the real-time audio and video with the patient on the date of service. I spent an additional 5 minutes on pre- and post-visit activities on the date of service.     The patient was physically located in [place] or a state in which I am permitted to provide care. The patient and/or parent/guardian understood that s/he may incur co-pays and cost sharing, and agreed to the telemedicine visit. The visit was reasonable and appropriate under the circumstances given the patient's presentation at the time.    The patient and/or parent/guardian has been advised of the potential risks and limitations of this mode of treatment (including, but not limited to, the absence of in-person examination) and has agreed to be treated using telemedicine. The patient's/patient's family's questions regarding telemedicine have been answered.     If the visit was completed in an ambulatory setting, the patient and/or parent/guardian has also been advised to contact their provider???s office for worsening conditions, and seek emergency medical treatment and/or call 911 if the patient deems either necessary.      04/07/2021 10:42 AM

## 2021-04-10 NOTE — Unmapped (Signed)
Patient presented and imaging reviewed at GU tumor board on 04/03/21 with:   ? Radiologists ???Dr. Jacqulyn Liner  ? Pathologists  ??? Dr. Dario Ave        Discussion/Findings:  No Urologist was on the Zoom call at the time of presentation. The radiologist recommended to follow up with Urology regarding a potential surgical plan for confirmed suspicious lesion.        HPI  Patient is a 70 y.o. Male with PMH of kidney transplant in 2008, RCC in native kidneys, and now with suspicious lesion on contrast enhanced ultrasound of the transplanted kidney in April 02, 2021.     GFR 31 from 01/08/21       Renal Mass Size and Character:    Transplanted kidney, RLQ     04/02/21        1.7 x 1.9 x 2.0 cm       on Contrast Enhanced RUS      Demonstrates both early enhancement and later washout. Also noted internal hemorrhaging of the lesion.   Radiology confirmed concern for malignancy

## 2021-04-14 DIAGNOSIS — R490 Dysphonia: Secondary | ICD-10-CM | POA: Diagnosis not present

## 2021-04-14 DIAGNOSIS — R059 Cough, unspecified: Secondary | ICD-10-CM | POA: Diagnosis not present

## 2021-04-14 NOTE — Unmapped (Signed)
[facility] SPEECH [place] [address]  OUTPATIENT SPEECH PATHOLOGY  04/14/2021      Patient Name: Miguel Ward  Date of Birth:[birth year]  Session Number: 3  Diagnosis: No diagnosis found.        Date of Evaluation: 01/27/21  Date of Symptom Onset: 12/13/20  Referred by: Dr. [name]                   Note Type: Treatment Note    ASSESSMENT:     Next Visit Plan: review HEP, cough therapy, resonant voice exercises    OBJECTIVE:      Pt was 70% accurate for engaging lower abdominal muscles to support breath flow during phonation given min cues.     Pt was 80% accurate for /v,z,w/ words and  /m/ words, phrases, and sentences in chant and connected speech given mod fading to min cues to concentrate on forward resonance. Pt was stimulable for increase volume of speech given mod cues for engaging breath support.     Pt was 90% accurate in minimal pairs exercises targeting forward placement of voiceless and voiced phonemes and words given min cues.    Pt was 90% accurate in maintaining forward resonance with adequate pitch and dynamic range without hyperfunction in conversation training given min cues. He had difficulty with negative practice and generating his dysphonic sound.         Stimulability: Pt was very stimulable  Treatment Recommendations: Continue Treatment    PLAN:  SLP Follow-up / Frequency: 2x per month for Planned Treatment Duration : 1 45 min tx session per week-every other week x9 over 3 months      Planned Interventions: Vocal Function Exercises, Respiratory Muscle Therapy (RMT), Resonant Voice Therapy, Cough therapy Cough therapy, Semi-occluded Vocal Tract Exercises, Financial controller, Functional Phonation Tasks    Prognosis:  Good    Negative Prognosis Rationale: Time post onset, Severity of deficits       Positive Prognosis Rationale: Behavior, Motivation, Response to trial treatment      Goals:        STG 1: Breathing: Pt will demonstrate abdominal breathing for support of phonation with 80% accuracy given minimal cues    STG 2: Resonance: Pt will balance oral/nasal resonance to maximize vocal output with minimal effort/laryngeal hyperfunction with 80% accuracy given minimal cues    STG 3: Strength/balance: Pt will strengthen and balance laryngeal musculature for support of connected speech with 80% accuracy given minimal cues    STG 4: Inflection: Pt. will increase inflection, improve phonatory flexibility, and increase laryngeal control without laryngeal hyperfunction with 80% accuracy given minimal cues    STG 5: VCD/Cough: Pt will minimize or eliminate cough and/or Vocal Cord Dysfunction by implementing learned strategies with 100% accuracy given minimal cues                   Time Frame: 3  Duration: months    LTG #1: Pt will report improved functional voice for ADL's without the following symptoms: chronic hoarseness, vocal fatigue, pain with extended voice use, decreased volume, and intermittent voice loss (+)    LTG #2: Pt will report employment of VCD strategies in order to minimize chronic cough, vocal cord dysfunction, and/or laryngeal spasm                                  Time Frame: 3  Duration: months  SUBJECTIVE:  Voice sounds pretty good but still squeaks out  Pain?: No      Precautions: None      Education Provided: Patient, SLP Plan of Care, Importance of Therapy, Strategies to maximize voicing    Response to Education: Understanding verbalized     Communication/Consultation: n/a    Session Duration : 35    Today's Charges (noted here with $$):  SLP Treatment Charges  $$ Treatment S/L/V - individual [mins]: 35                   I attest that I have reviewed the above information.  Signed: Genene Churn, SLP  04/14/2021 2:57 PM         The patient reports they are currently: at home. I spent 35 minutes on the real-time audio and video with the patient on the date of service. I spent an additional 5 minutes on pre- and post-visit activities on the date of service.     The patient was physically located in [place] or a state in which I am permitted to provide care. The patient and/or parent/guardian understood that s/he may incur co-pays and cost sharing, and agreed to the telemedicine visit. The visit was reasonable and appropriate under the circumstances given the patient's presentation at the time.    The patient and/or parent/guardian has been advised of the potential risks and limitations of this mode of treatment (including, but not limited to, the absence of in-person examination) and has agreed to be treated using telemedicine. The patient's/patient's family's questions regarding telemedicine have been answered.     If the visit was completed in an ambulatory setting, the patient and/or parent/guardian has also been advised to contact their provider???s office for worsening conditions, and seek emergency medical treatment and/or call 911 if the patient deems either necessary.

## 2021-04-16 NOTE — Unmapped (Signed)
[facility] Refill Coordination Note    Specialty Medication(s) to be Shipped:   Transplant: tacrolimus 1mg     Other medication(s) to be shipped: No additional medications requested for fill at this time     Miguel Ward, DOB: 13-Aug-1951  Phone: [phone] (home)       All above HIPAA information was verified with patient.     Was a Nurse, learning disability used for this call? No    Completed refill call assessment today to schedule patient's medication shipment from the [facility] [phone]).  All relevant notes have been reviewed.     Specialty medication(s) and dose(s) confirmed: Regimen is correct and unchanged.   Changes to medications: Devron reports no changes at this time.  Changes to insurance: No  New side effects reported not previously addressed with a pharmacist or physician: None reported  Questions for the pharmacist: No    Confirmed patient received a Conservation officer, historic buildings and a Surveyor, mining with first shipment. The patient will receive a drug information handout for each medication shipped and additional FDA Medication Guides as required.       DISEASE/MEDICATION-SPECIFIC INFORMATION        N/A    SPECIALTY MEDICATION ADHERENCE     Medication Adherence    Patient reported X missed doses in the last month: 0  Specialty Medication: Tacrolimus 1mg   Patient is on additional specialty medications: No  Adherence tools used: patient uses a pill box to manage medications, calendar              Were doses missed due to medication being on hold? No    Tacrolimus 1 mg: 13 days of medicine on hand     REFERRAL TO PHARMACIST     Referral to the pharmacist: Not needed      [facility]     Shipping address confirmed in Epic.     Delivery Scheduled: Yes, Expected medication delivery date: 04/24/21.     Medication will be delivered via UPS to the prescription address in Epic WAM.    [name]   [facility] Specialty Pharmacist

## 2021-04-17 NOTE — Unmapped (Signed)
Left VM asking patient to return my call for scheduling.

## 2021-04-21 DIAGNOSIS — R059 Cough, unspecified: Secondary | ICD-10-CM | POA: Diagnosis not present

## 2021-04-21 DIAGNOSIS — R490 Dysphonia: Secondary | ICD-10-CM | POA: Diagnosis not present

## 2021-04-21 NOTE — Unmapped (Signed)
[facility] SPEECH [place] [address]  OUTPATIENT SPEECH PATHOLOGY  04/21/2021      Patient Name: Miguel Ward  Date of Birth:02-06-1951  Session Number: 4  Diagnosis:   Encounter Diagnoses   Name Primary?   ??? Cough Yes   ??? Dysphonia            Date of Evaluation: 01/27/21  Date of Symptom Onset: 12/13/20  Referred by: Dr. [name]              Chief Complaint: Dysphonia    Note Type: Treatment Note    ASSESSMENT:     Next Visit Plan: review HEP, cough therapy, resonant voice exercises; discharge education    OBJECTIVE: All goals targeted through conversation training today.  In conversation, Pt was 80% accurate for maintaining forward resonance without hyperfunction independently during conversation exercises. Breath support 80% accurate for all tasks given min cues.       Stimulability: Pt was very stimulable  Treatment Recommendations: Continue Treatment    PLAN:  SLP Follow-up / Frequency: 2x per month for Planned Treatment Duration : 1 45 min tx session per week-every other week x9 over 3 months      Planned Interventions: Vocal Function Exercises, Respiratory Muscle Therapy (RMT), Resonant Voice Therapy, Cough therapy Cough therapy, Semi-occluded Vocal Tract Exercises, Financial controller, Functional Phonation Tasks    Prognosis:  Good    Negative Prognosis Rationale: Time post onset, Severity of deficits       Positive Prognosis Rationale: Behavior, Motivation, Response to trial treatment      Goals:        STG 1: Breathing: Pt will demonstrate abdominal breathing for support of phonation with 80% accuracy given minimal cues    STG 2: Resonance: Pt will balance oral/nasal resonance to maximize vocal output with minimal effort/laryngeal hyperfunction with 80% accuracy given minimal cues    STG 3: Strength/balance: Pt will strengthen and balance laryngeal musculature for support of connected speech with 80% accuracy given minimal cues    STG 4: Inflection: Pt. will increase inflection, improve phonatory flexibility, and increase laryngeal control without laryngeal hyperfunction with 80% accuracy given minimal cues    STG 5: VCD/Cough: Pt will minimize or eliminate cough and/or Vocal Cord Dysfunction by implementing learned strategies with 100% accuracy given minimal cues                   Time Frame: 3  Duration: months    LTG #1: Pt will report improved functional voice for ADL's without the following symptoms: chronic hoarseness, vocal fatigue, pain with extended voice use, decreased volume, and intermittent voice loss (+)    LTG #2: Pt will report employment of VCD strategies in order to minimize chronic cough, vocal cord dysfunction, and/or laryngeal spasm                                  Time Frame: 3  Duration: months       SUBJECTIVE:  Patient rates his voice as a 7 or 8. Feels like he has not retruned to baseline. Understand that he need to learn not to strain. He feels like the cough has improved.   Pain?: No      Precautions: None      Education Provided: Patient, SLP Plan of Care, Importance of Therapy, Strategies to maximize voicing    Response to Education: Understanding verbalized  Communication/Consultation: n/a    Session Duration : 35    Today's Charges (noted here with $$):  SLP Treatment Charges  $$ Treatment S/L/V - individual [mins]: 35                   I attest that I have reviewed the above information.  Signed: Genene Churn, SLP  04/21/2021 3:11 PM         The patient reports they are currently: not at home. I spent 35 minutes on the real-time audio and video with the patient on the date of service. I spent an additional 5 minutes on pre- and post-visit activities on the date of service.     The patient was physically located in [place] or a state in which I am permitted to provide care. The patient and/or parent/guardian understood that s/he may incur co-pays and cost sharing, and agreed to the telemedicine visit. The visit was reasonable and appropriate under the circumstances given the patient's presentation at the time.    The patient and/or parent/guardian has been advised of the potential risks and limitations of this mode of treatment (including, but not limited to, the absence of in-person examination) and has agreed to be treated using telemedicine. The patient's/patient's family's questions regarding telemedicine have been answered.     If the visit was completed in an ambulatory setting, the patient and/or parent/guardian has also been advised to contact their provider???s office for worsening conditions, and seek emergency medical treatment and/or call 911 if the patient deems either necessary.

## 2021-04-23 MED FILL — TACROLIMUS 1 MG CAPSULE, IMMEDIATE-RELEASE: ORAL | 30 days supply | Qty: 150 | Fill #3

## 2021-04-28 DIAGNOSIS — R490 Dysphonia: Secondary | ICD-10-CM | POA: Diagnosis not present

## 2021-04-28 DIAGNOSIS — R059 Cough, unspecified: Secondary | ICD-10-CM | POA: Diagnosis not present

## 2021-04-28 NOTE — Unmapped (Signed)
[facility] SPEECH [place] [address]  OUTPATIENT SPEECH PATHOLOGY  04/28/2021      Patient Name: Miguel Ward  Date of Birth:10/14/1951  Session Number: 5  Diagnosis:   Encounter Diagnoses   Name Primary?   ??? Cough Yes   ??? Dysphonia            Date of Evaluation: 01/27/21  Date of Symptom Onset: 12/13/20  Referred by: Dr. [name]              Chief Complaint: Dysphonia    Note Type: Treatment Note    ASSESSMENT:     Next Visit Plan: See in-person for final session re-evaluation. Pt is at a good point for discharge, he will keep his appointment for 5/31 but cancel closer to that date if he feels that he has maintained gains achieve thus far.     OBJECTIVE:      All goals address today through functional phonation tasks. During 30 minute conversation, Pt was 90% accurate for using breath support to maintain balance and resonance with appropriate inflection independently. During singing task targeting decreased tension in upper register, Pt was able to use tongue tension release and improved breath support to decrease strain in 3/3 opportunities given max cues and clinician modeling.      Stimulability: Pt was very stimulable  Treatment Recommendations: Continue Treatment    PLAN:  SLP Follow-up / Frequency: 2x per month for Planned Treatment Duration : 1 45 min tx session per week-every other week x9 over 3 months      Planned Interventions: Vocal Function Exercises, Respiratory Muscle Therapy (RMT), Resonant Voice Therapy, Cough therapy Cough therapy, Semi-occluded Vocal Tract Exercises, Financial controller, Functional Phonation Tasks    Prognosis:  Good    Negative Prognosis Rationale: Time post onset, Severity of deficits       Positive Prognosis Rationale: Behavior, Motivation, Response to trial treatment      Goals:        STG 1: Breathing: Pt will demonstrate abdominal breathing for support of phonation with 80% accuracy given minimal cues    STG 2: Resonance: Pt will balance oral/nasal resonance to maximize vocal output with minimal effort/laryngeal hyperfunction with 80% accuracy given minimal cues    STG 3: Strength/balance: Pt will strengthen and balance laryngeal musculature for support of connected speech with 80% accuracy given minimal cues    STG 4: Inflection: Pt. will increase inflection, improve phonatory flexibility, and increase laryngeal control without laryngeal hyperfunction with 80% accuracy given minimal cues    STG 5: VCD/Cough: Pt will minimize or eliminate cough and/or Vocal Cord Dysfunction by implementing learned strategies with 100% accuracy given minimal cues                   Time Frame: 3  Duration: months    LTG #1: Pt will report improved functional voice for ADL's without the following symptoms: chronic hoarseness, vocal fatigue, pain with extended voice use, decreased volume, and intermittent voice loss (+)    LTG #2: Pt will report employment of VCD strategies in order to minimize chronic cough, vocal cord dysfunction, and/or laryngeal spasm                                  Time Frame: 3  Duration: months       SUBJECTIVE:  He is able to order a drive-thrus without issue. Still isn't able  to swing high register with the choir. Feels voice is an 8-9/10.  Pain?: No      Precautions: None      Education Provided: Patient, SLP Plan of Care, Importance of Therapy, Strategies to maximize voicing    Response to Education: Understanding verbalized     Communication/Consultation: n/a    Session Duration : 45    Today's Charges (noted here with $$):  SLP Treatment Charges  $$ Treatment S/L/V - individual [mins]: 35                   I attest that I have reviewed the above information.  Signed: Genene Churn, SLP  04/28/2021 3:09 PM         The patient reports they are currently: at home. I spent 35 minutes on the real-time audio and video with the patient on the date of service. I spent an additional 5 minutes on pre- and post-visit activities on the date of service.     The patient was physically located in [place] or a state in which I am permitted to provide care. The patient and/or parent/guardian understood that s/he may incur co-pays and cost sharing, and agreed to the telemedicine visit. The visit was reasonable and appropriate under the circumstances given the patient's presentation at the time.    The patient and/or parent/guardian has been advised of the potential risks and limitations of this mode of treatment (including, but not limited to, the absence of in-person examination) and has agreed to be treated using telemedicine. The patient's/patient's family's questions regarding telemedicine have been answered.     If the visit was completed in an ambulatory setting, the patient and/or parent/guardian has also been advised to contact their provider???s office for worsening conditions, and seek emergency medical treatment and/or call 911 if the patient deems either necessary.

## 2021-04-30 NOTE — Unmapped (Signed)
Left VM asking patient to return my call for scheduling

## 2021-05-02 DIAGNOSIS — N2889 Other specified disorders of kidney and ureter: Principal | ICD-10-CM

## 2021-05-05 MED ORDER — OMEPRAZOLE 20 MG CAPSULE,DELAYED RELEASE
ORAL_CAPSULE | Freq: Every day | ORAL | 3 refills | 90 days | Status: CP
Start: 2021-05-05 — End: ?

## 2021-05-08 ENCOUNTER — Ambulatory Visit: Admit: 2021-05-08 | Discharge: 2021-05-09 | Payer: MEDICARE

## 2021-05-08 DIAGNOSIS — N2889 Other specified disorders of kidney and ureter: Principal | ICD-10-CM

## 2021-05-08 DIAGNOSIS — I639 Cerebral infarction, unspecified: Secondary | ICD-10-CM | POA: Diagnosis not present

## 2021-05-08 DIAGNOSIS — D649 Anemia, unspecified: Secondary | ICD-10-CM | POA: Diagnosis not present

## 2021-05-08 DIAGNOSIS — E785 Hyperlipidemia, unspecified: Secondary | ICD-10-CM | POA: Diagnosis not present

## 2021-05-08 DIAGNOSIS — E079 Disorder of thyroid, unspecified: Secondary | ICD-10-CM | POA: Diagnosis not present

## 2021-05-08 DIAGNOSIS — I82409 Acute embolism and thrombosis of unspecified deep veins of unspecified lower extremity: Secondary | ICD-10-CM | POA: Diagnosis not present

## 2021-05-08 DIAGNOSIS — I509 Heart failure, unspecified: Secondary | ICD-10-CM | POA: Diagnosis not present

## 2021-05-08 DIAGNOSIS — Z79899 Other long term (current) drug therapy: Secondary | ICD-10-CM | POA: Diagnosis not present

## 2021-05-08 DIAGNOSIS — I110 Hypertensive heart disease with heart failure: Secondary | ICD-10-CM | POA: Diagnosis not present

## 2021-05-08 DIAGNOSIS — Z7982 Long term (current) use of aspirin: Secondary | ICD-10-CM | POA: Diagnosis not present

## 2021-05-08 LAB — COMPREHENSIVE METABOLIC PANEL
ALBUMIN: 3.4 g/dL (ref 3.4–5.0)
ALKALINE PHOSPHATASE: 136 U/L — ABNORMAL HIGH (ref 46–116)
ALT (SGPT): 21 U/L (ref 10–49)
ANION GAP: 7 mmol/L (ref 5–14)
AST (SGOT): 22 U/L (ref <=34)
BILIRUBIN TOTAL: 0.6 mg/dL (ref 0.3–1.2)
BLOOD UREA NITROGEN: 58 mg/dL — ABNORMAL HIGH (ref 9–23)
BUN / CREAT RATIO: 23
CALCIUM: 9.8 mg/dL (ref 8.7–10.4)
CHLORIDE: 108 mmol/L — ABNORMAL HIGH (ref 98–107)
CO2: 26 mmol/L (ref 20.0–31.0)
CREATININE: 2.5 mg/dL — ABNORMAL HIGH
EGFR CKD-EPI (2021) MALE: 27 mL/min/{1.73_m2} — ABNORMAL LOW (ref >=60)
GLUCOSE RANDOM: 94 mg/dL (ref 70–179)
POTASSIUM: 4.2 mmol/L (ref 3.4–4.8)
PROTEIN TOTAL: 7 g/dL (ref 5.7–8.2)
SODIUM: 141 mmol/L (ref 135–145)

## 2021-05-08 LAB — CBC
HEMATOCRIT: 34.6 % — ABNORMAL LOW (ref 39.0–48.0)
HEMOGLOBIN: 11 g/dL — ABNORMAL LOW (ref 12.9–16.5)
MEAN CORPUSCULAR HEMOGLOBIN CONC: 31.9 g/dL — ABNORMAL LOW (ref 32.0–36.0)
MEAN CORPUSCULAR HEMOGLOBIN: 27.3 pg (ref 25.9–32.4)
MEAN CORPUSCULAR VOLUME: 85.7 fL (ref 77.6–95.7)
MEAN PLATELET VOLUME: 10.1 fL (ref 6.8–10.7)
PLATELET COUNT: 100 10*9/L — ABNORMAL LOW (ref 150–450)
RED BLOOD CELL COUNT: 4.04 10*12/L — ABNORMAL LOW (ref 4.26–5.60)
RED CELL DISTRIBUTION WIDTH: 15.4 % — ABNORMAL HIGH (ref 12.2–15.2)
WBC ADJUSTED: 5.8 10*9/L (ref 3.6–11.2)

## 2021-05-08 NOTE — Unmapped (Signed)
ASSESSMENT AND PLAN:  Miguel Ward is a 70 y.o. 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 He presents with MRI/contrast-enhanced [place] evidence of new mass in his transplant kidney that has increased in size from 1.7cm to 1.9 cm.  He also had another lesion on his left kidney that is less conspicuous on repeat imaging.    I had a long discussion with the patient concerning his renal mass.      I discussed the natural history of these small masses and the risk of malignancy. Given the size of the renal mass, I estimate that the chance for benign disease is 20-30%. We discussed the role of percutaneous biopsy including both the accuracy and potential impact on decision-making. We also reviewed the small risk, which is typically bleeding.       Next, we discussed the typical management approaches including active surveillance, thermal ablation, and surgery.     1. Active surveillance: In light of the prognosis, one option could be to watch closely. The risk of metastatic spread is <3% at 5 years and the risk of kidney cancer specific mortality at 10 years would be <10%. We would recommend imaging every 6-12 months. Should the tumor increase in size, we may need to consider downstream intervention. Triggers can include tumor size > 3 cm or significant growth, though the latter is not a great indicator of aggressiveness. This option is complicated by his immunosuppression, which could accelerate progression of disease.     2. Thermal ablation: This is a procedure done by radiology that involves inserting a needle into the tumor under image-guidance followed by freezing of the tumor. This option is associated with high cure rates (93-95%), especially with tumors <3 cm in size. Typically, this can be done on an outpatient basis. There is an approximately 10% risk of complications, which including bleeding and injury to surrounding structures. In the case of recurrence, a repeat ablation can often be performed. Partial nephrectomy can be more difficult after ablation but often not needed given the high effectiveness. This option may have less of an impact on renal function compared to partial nephrectomy given the location of the tumor and potential complexity of the hilar dissection.      3. Surgery: Based on the tumor size and location, the tumor may be amenable to a robotic partial nephrectomy. We reviewed the expected hospitalization course of 1-2 days. I also reviewed the potential risks of bleeding, pain, infection, injury to surrounding structures (e.g., spleen, bowel, pancreas, thorax), difficulty with anesthesia, DVT/PE, cardiac event, stroke, respiratory problems, and even a remote chance of death. For partial nephrectomy, we reviewed the potential health benefits of sparing as well as the additional risk for delayed bleeding or urine leak that may require further intervention such as arterial embolization and/or drain placement. We also discussed the very small chance of needing to do a total nephrectomy. Surgery carries a very high cure rate, 95-98%. We discussed the potential risk of injury to the kidney including acute renal failure, worsening CKD, or even need for dialysis. The mass itself is pretty endophytic so would likely require clamping and have a resultant decrease in renal function. Hopefully there is enough reserve but this isn't guaranteed.     We reviewed all of his questions. No treatment decision has been  made at this time. We will plan to talk to VIR and to transplant nephrology to get their opinions.     We will discuss his case in tumor board and follow up with him pending this discussion.     It was a pleasure seeing this patient in my clinic today.     Plan  1. CBC, CMP today  2. CXR today  3. Discuss assessment & plan during multidisciplinary tumor board  4. We will reach out to patient's transplant nephrologist to discuss case  5. We will reach out to radiology to discuss feasibility of percutaneous cryoablation given location of mass  6. Continue to monitor left renal cysts with periodic imaging    CHIEF COMPLAINT:     History of lupus s/p DDK transplant in 2008 and right robot assisted laparoscopic radical nephrectomy in 2015 for RCC with new mass in transplanted kidney.     HISTORY OF PRESENT ILLNESS:   Miguel Ward is a 70 y.o. male who comes in today in follow-up after a contrast enhanced renal ultrasound on 04/02/21 demonstrated a hyperechoic lesion measuring up to 2.0 cm at the upper part of the right lower quadrant transplant kidney demonstrating early enhancement and later washout. This lesion is worrisome for renal cortical neoplasm including renal cell carcinoma.     He has a history of pT1aNx RCC s/p right laparoscopic radical nephrectomy in 2015. Prior to that he underwent deceased donor kidney transplant on 06/11/2007 secondary to lupus nephritis and has been off dialysis since that time.     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 No other biopsies since then.     MRI 09/2020: Transplant kidney lower pole T2 hypointense lesion measuring 1.7 cm.  Native left kidney posterior left lower pole lesion measuring 0.6 cm.    [place] 10/2020: Transplant kidney: The previously identified echogenic structure within the superior/interpolar region measures 1.7 x 1.6 x 1.7 cm, previously 1.2 x 1.1 x 1.3 cm    MRI 01/2021: Again noted is a 1.9 cm interpolar lesion within the transplant kidney that demonstrates nodular internal components with equivocal progressive enhancement.    [place] with contrast 03/2021: Hyperechoic lesion measuring up to 2.0 cm at the upper part of the right lower quadrant transplant kidney demonstrating early enhancement and later washout.    Currently seeing a cardiologist for CHF diagnosed in 2018, taking lasix. Last EF 40-50%.   Takes daily ASA 81mg  for CAD. No blood thinners. Never smoker.    IPSS: 3, QoL 3, no leakage  SHIM: 5      MEDICATIONS:   Current Outpatient Medications   Medication Sig Dispense Refill   ??? acyclovir (ZOVIRAX) 400 MG tablet Take 1 tablet (400 mg total) by mouth daily. 90 tablet 3   ??? allopurinoL (ZYLOPRIM) 100 MG tablet Take 1 tablet (100 mg total) by mouth daily. 30 tablet 11   ??? aspirin 81 MG chewable tablet Chew 81 mg daily.     ??? atorvastatin (LIPITOR) 20 MG tablet TAKE 1 TABLET BY MOUTH NIGHTLY. 30 tablet 11   ??? calcitrioL (ROCALTROL) 0.25 MCG capsule TAKE 1 CAPSULE BY MOUTH EVERY MONDAY, WEDNESDAY, AND FRIDAY AT 4PM. 36 capsule 3   ??? colchicine (COLCRYS) 0.6 mg tablet Take 1 tablet (0.6 mg total) by mouth daily as needed for up to 1  dose. 30 tablet 1   ??? enalapril (VASOTEC) 5 MG tablet Take 1 tablet (5 mg total) by mouth Two (2) times a day. 180 tablet 3   ??? ferrous sulfate 325 (65 FE) MG tablet Take 1 tablet (325 mg total) by mouth daily.  0   ??? fluticasone (FLONASE) 50 mcg/actuation nasal spray 1 spray into each nostril daily as needed.     ??? furosemide (LASIX) 20 MG tablet Take 40 mg in the morning and 20 mg in the evening. 270 tablet 3   ??? levothyroxine (SYNTHROID) 200 MCG tablet Take 1 tab with two tab, total dose daily 90 tablet 3   ??? levothyroxine (SYNTHROID) 25 MCG tablet TAKE (2) TABLETS BY MOUTH EVERY DAY WITH1 TAB FOR A TOTAL DAILY DOSE OF 180 tablet 3   ??? magnesium oxide (MAG-OX) 400 mg (241.3 mg elemental magnesium) tablet Take 1 tablet (400 mg total) by mouth Two (2) times a day. 60 tablet 11   ??? metoprolol succinate (TOPROL-XL) 100 MG 24 hr tablet Take 1 tablet (100 mg total) by mouth daily. 90 tablet 3   ??? mycophenolate (CELLCEPT) 250 mg capsule Take 1 capsule (250 mg total) by mouth Two (2) times a day. 60 capsule 11   ??? omeprazole (PRILOSEC) 20 MG capsule Take 1 capsule (20 mg total) by mouth in the morning. 90 capsule 3   ??? predniSONE (DELTASONE) 5 MG tablet Take 1 tablet (5 mg total) by mouth daily. 90 tablet 3   ??? simethicone (GAS-X) 80 MG chewable tablet Chew 1 tablet (80 mg total) every six (6) hours as needed for flatulence. 30 tablet 1   ??? tacrolimus (PROGRAF) 1 MG capsule Take 3 capsules (3 mg total) by mouth every morning AND 2 capsules (2 mg total) nightly. 150 capsule 11     No current facility-administered medications for this visit.     MEDICAL HISTORY  Past Medical History:   Diagnosis Date   ??? Anemia    ??? CHF (congestive heart failure) (CMS-HCC)    ??? Coronary artery disease    ??? Disease of thyroid gland    ??? GERD (gastroesophageal reflux disease)    ??? Gout    ??? Hyperlipidemia    ??? Hypertension    ??? Lupus nephritis (CMS-HCC)    ??? NSTEMI (non-ST elevated myocardial infarction) (CMS-HCC)    ??? Red blood cell antibody positive     anti-K   ??? Renal cancer, right (CMS-HCC)    ??? Renal transplant, status post        ALLERGIES:    Allergies   Allergen Reactions   ??? Penicillins Hives     Other reaction(s): HIVES     FAMILY HISTORY:  Father - colon cancer    ROS:   Negative upon 10-system review other than what is mentioned in the HPI.     PHYSICAL EXAMINATION:   GENERAL: Pleasant male in no acute distress.   VITAL SIGNS: Blood pressure 160/82, pulse 65, temperature 36.8 ??C, temperature source Temporal, resp. rate 16, height 182.9 cm (6'), weight 75.9 kg (167 lb 4.8 oz), SpO2 100 %.  HEENT: Normocephalic, atraumatic, extraocular muscles intact  NECK: Supple, no lymphadenopathy  CARDIOVASCULAR: No peripheral edema  PULMONARY: Normal work of breathing, no use of accessory muscles  ABDOMEN: Soft, non-tender, non-distended. No organomegaly or hernias. Well- healed abdominal scars.   EXTREMITIES: No clubbing, cyanosis or edema.   NEUROLOGIC:  Cranial nerves II-XII grossly intact  PSYCHOLOGIC: Normal  affect, normal mood  SKIN: Warm and dry. No lesions.    LABS:  Results for orders placed or performed during the hospital encounter of 02/04/21   Surgical pathology exam   Result Value Ref Range    Diagnosis       A: Colon, transverse, biopsy:  -Inflammatory polyp.    B: Colon, sigmoid, biopsy:  -Colonic mucosa with lymphoid aggregate.      This electronic signature is attestation that the pathologist personally reviewed the submitted material(s) and the final diagnosis reflects that evaluation.      Clinical History       Pre-op diagnosis: lower GI bleeding      Gross Description            A.  Label: Transverse.  Size: 5 x 4 x 2 mm.  Appearance: One fragment of soft Dorota Heinrichs tissue.  A1, NTR.         B.   Label: Sigmoid.  Size: 3 x 2 x 2 mm.  Appearance: One fragment of soft Earlie Schank-brown tissue.  B1, NTR.    Aline August)        Microscopic Description       Microscopic examination substantiates the above diagnosis.    Resident Physician: None Assigned      Disclaimer       Unless otherwise specified, specimens are preserved using 10% neutral buffered formalin. For cases in which immunohistochemical and/or in-situ hybridization stains are performed, the following statement applies: Appropriate controls for each stain (positive controls with or without negative controls) have been evaluated and stain as expected. These stains have not been separately validated for use on decalcified specimens and should be interpreted with caution in that setting. Some of the reagents used for these stains may be classified as analyte specific reagents (ASR). Tests using ASRs were developed, and their performance characteristics were determined, by the Anatomic Pathology Department [facility]). They have not been cleared or approved by the [place] Food and Drug Administration (FDA). The FDA does not require these tests to go through premarket FDA review. These tests are used for clinical purposes. They should not be regarded as investigational or for research. This laboratory is certified under the Clinical Laboratory Improvement Amendments (CLIA) as qualified to perform high complexity clinical laboratory testing.      EMBEDDED IMAGES       *Note: Due to a large number of results and/or encounters for the requested time period, some results have not been displayed. A complete set of results can be found in Results Review.     Imaging     Ultrasound 04/02/21  FINDINGS:  LESION 1  Number of injections: 2  Volume of contrast used: 3 ml  Location: Upper part of the right lower quadrant transplant kidney  Size: 1.7 x 1.9 x 2.0 cm  Enhancement pattern: The lesion demonstrates early enhancement and later washout.    ??  I saw and evaluated the patient, participating in the key portions of the service.?? I reviewed the resident???s note.?? I agree with the resident???s findings and plan. Princella Ion, MD

## 2021-05-13 ENCOUNTER — Ambulatory Visit: Admit: 2021-05-13 | Attending: Speech-Language Pathologist | Primary: Speech-Language Pathologist

## 2021-05-13 DIAGNOSIS — N2889 Other specified disorders of kidney and ureter: Principal | ICD-10-CM

## 2021-05-13 NOTE — Unmapped (Addendum)
Patient returned my call and is aware of IR appointment and agrees with the plan of care.  ----- Message from Cathren Harsh, RN sent at 05/13/2021  3:30 PM EDT -----  I left a voicemail for the patient to return my call regarding the message below. Patient has an IR consult 05/22/21--thank you  Ocie Cornfield, MD  Cathren Harsh, RN    Annamaria Boots - can you let patient know we recommend biopsy/cryoablation with VIR?     Ray

## 2021-05-13 NOTE — Unmapped (Signed)
lvm with appt. date

## 2021-05-15 MED ORDER — LEVOTHYROXINE 200 MCG TABLET
ORAL_TABLET | 3 refills | 0 days | Status: CP
Start: 2021-05-15 — End: ?

## 2021-05-16 NOTE — Unmapped (Signed)
[facility] Clinical Assessment & Refill Coordination Note    Miguel Ward, DOB: Sep 10, 1951  Phone: [phone] (home)     All above HIPAA information was verified with patient.     Was a Nurse, learning disability used for this call? No    Specialty Medication(s):   Transplant: tacrolimus 1mg      Current Outpatient Medications   Medication Sig Dispense Refill   ??? acyclovir (ZOVIRAX) 400 MG tablet Take 1 tablet (400 mg total) by mouth daily. 90 tablet 3   ??? allopurinoL (ZYLOPRIM) 100 MG tablet Take 1 tablet (100 mg total) by mouth daily. 30 tablet 11   ??? aspirin 81 MG chewable tablet Chew 81 mg daily.     ??? atorvastatin (LIPITOR) 20 MG tablet TAKE 1 TABLET BY MOUTH NIGHTLY. 30 tablet 11   ??? calcitrioL (ROCALTROL) 0.25 MCG capsule TAKE 1 CAPSULE BY MOUTH EVERY MONDAY, WEDNESDAY, AND FRIDAY AT 4PM. 36 capsule 3   ??? colchicine (COLCRYS) 0.6 mg tablet Take 1 tablet (0.6 mg total) by mouth daily as needed for up to 1 dose. 30 tablet 1   ??? enalapril (VASOTEC) 5 MG tablet Take 1 tablet (5 mg total) by mouth Two (2) times a day. 180 tablet 3   ??? ferrous sulfate 325 (65 FE) MG tablet Take 1 tablet (325 mg total) by mouth daily.  0   ??? fluticasone (FLONASE) 50 mcg/actuation nasal spray 1 spray into each nostril daily as needed.     ??? furosemide (LASIX) 20 MG tablet Take 40 mg in the morning and 20 mg in the evening. 270 tablet 3   ??? levothyroxine (SYNTHROID) 200 MCG tablet Take 1 tab with two tab, total dose daily 90 tablet 3   ??? levothyroxine (SYNTHROID) 25 MCG tablet TAKE (2) TABLETS BY MOUTH EVERY DAY WITH1 TAB FOR A TOTAL DAILY DOSE OF 180 tablet 3   ??? magnesium oxide (MAG-OX) 400 mg (241.3 mg elemental magnesium) tablet Take 1 tablet (400 mg total) by mouth Two (2) times a day. 60 tablet 11   ??? metoprolol succinate (TOPROL-XL) 100 MG 24 hr tablet Take 1 tablet (100 mg total) by mouth daily. 90 tablet 3   ??? mycophenolate (CELLCEPT) 250 mg capsule Take 1 capsule (250 mg total) by mouth Two (2) times a day. 60 capsule 11   ??? omeprazole (PRILOSEC) 20 MG capsule Take 1 capsule (20 mg total) by mouth in the morning. 90 capsule 3   ??? predniSONE (DELTASONE) 5 MG tablet Take 1 tablet (5 mg total) by mouth daily. 90 tablet 3   ??? simethicone (GAS-X) 80 MG chewable tablet Chew 1 tablet (80 mg total) every six (6) hours as needed for flatulence. 30 tablet 1   ??? tacrolimus (PROGRAF) 1 MG capsule Take 3 capsules (3 mg total) by mouth every morning AND 2 capsules (2 mg total) nightly. 150 capsule 11     No current facility-administered medications for this visit.        Changes to medications: Miguel Ward reports no changes at this time.    Allergies   Allergen Reactions   ??? Penicillins Hives     Other reaction(s): HIVES       Changes to allergies: No    SPECIALTY MEDICATION ADHERENCE     Tacrolimus 1mg   : 12 days of medicine on hand     Medication Adherence    Patient reported X missed doses in the last month: 0  Specialty  Medication: tacrolimus 1mg   Adherence tools used: patient uses a pill box to manage medications, calendar          Specialty medication(s) dose(s) confirmed: Regimen is correct and unchanged.     Are there any concerns with adherence? No    Adherence counseling provided? Not needed    CLINICAL MANAGEMENT AND INTERVENTION      Clinical Benefit Assessment:    Do you feel the medicine is effective or helping your condition? Yes    Clinical Benefit counseling provided? Not needed    Adverse Effects Assessment:    Are you experiencing any side effects? No    Are you experiencing difficulty administering your medicine? No    Quality of Life Assessment:    How many days over the past month did your trsansplant  keep you from your normal activities? For example, brushing your teeth or getting up in the morning. 0    Have you discussed this with your provider? Not needed    Acute Infection Status:    Acute infections noted within Epic:  No active infections  Patient reported infection: None    Therapy Appropriateness:    Is therapy appropriate? Yes, therapy is appropriate and should be continued    DISEASE/MEDICATION-SPECIFIC INFORMATION      N/A    PATIENT SPECIFIC NEEDS     - Does the patient have any physical, cognitive, or cultural barriers? No    - Is the patient high risk? Yes, patient is taking a REMS drug. Medication is dispensed in compliance with REMS program not from [facility]    - Does the patient require a Care Management Plan? No     - Does the patient require physician intervention or other additional services (i.e. nutrition, smoking cessation, social work)? No      SHIPPING     Specialty Medication(s) to be Shipped:   Transplant: tacrolimus 1mg     Other medication(s) to be shipped: No additional medications requested for fill at this time     Changes to insurance: No    Delivery Scheduled: Yes, Expected medication delivery date: 05/22/2021.     Medication will be delivered via UPS to the confirmed prescription address in [facility].    The patient will receive a drug information handout for each medication shipped and additional FDA Medication Guides as required.  Verified that patient has previously received a Conservation officer, historic buildings and a Surveyor, mining.    The patient or caregiver noted above participated in the development of this care plan and knows that they can request review of or adjustments to the care plan at any time.      All of the patient's questions and concerns have been addressed.    [name]   [facility] Specialty Pharmacist

## 2021-05-21 MED FILL — TACROLIMUS 1 MG CAPSULE, IMMEDIATE-RELEASE: ORAL | 30 days supply | Qty: 150 | Fill #4

## 2021-05-22 ENCOUNTER — Institutional Professional Consult (permissible substitution)
Admit: 2021-05-22 | Discharge: 2021-05-23 | Payer: MEDICARE | Attending: Diagnostic Radiology | Primary: Diagnostic Radiology

## 2021-05-22 DIAGNOSIS — T8619 Other complication of kidney transplant: Principal | ICD-10-CM

## 2021-05-22 DIAGNOSIS — N2889 Other specified disorders of kidney and ureter: Principal | ICD-10-CM

## 2021-05-22 NOTE — Unmapped (Signed)
[facility] - INITIAL VISIT AND CONSULTATION    Patient Name: Miguel Ward  Patient Age: 70 y.o.  Encounter Date: 05/21/2021    REFERRING PHYSICIAN: Princella Ion, MD  [address]  [address]  [place],  [place] [address]    PRIMARY CARE PROVIDER: Drinda Butts, MD      Subjective:     Chief Complaint: Renal mass    History of Present Illness:  Miguel Ward is a 70 y.o. male who is seen at the request of Tan, Hung-Jui, MD for treatment considerations in the setting of an enhancing mass in a transplant kidney worrisome for RCC. He has a h/o of CKD 2/2 SLE and underwent DDK transplant in 2008. In 2015, he had right radical nephrectomy for RCC. Recent MR and contrast-enhanced US show an enlarging mass in his transplant measuring around 1.9 cm.    Review of Systems: Pertinent positives and negatives are noted in the HPI above. Review of systems otherwise negative. No other complaints.    Past Medical/Surgical History:  Past Medical History:   Diagnosis Date   ??? Anemia    ??? CHF (congestive heart failure) (CMS-HCC)    ??? Coronary artery disease    ??? Disease of thyroid gland    ??? GERD (gastroesophageal reflux disease)    ??? Gout    ??? Hyperlipidemia    ??? Hypertension    ??? Lupus nephritis (CMS-HCC)    ??? NSTEMI (non-ST elevated myocardial infarction) (CMS-HCC)    ??? Red blood cell antibody positive     anti-K   ??? Renal cancer, right (CMS-HCC)    ??? Renal transplant, status post        Past Surgical History:   Procedure Laterality Date   ??? AV FISTULA PLACEMENT Right    ??? HERNIA REPAIR     ??? PR BREATH HYDROGEN TEST N/A 06/24/2016    Procedure: BREATH HYDROGEN TEST;  Surgeon: Nurse-Based Giproc;  [facility];  Service: Gastroenterology   ??? PR COLONOSCOPY FLX DX W/COLLJ SPEC WHEN PFRMD N/A 05/22/2016    Procedure: COLONOSCOPY, FLEXIBLE, PROXIMAL TO SPLENIC FLEXURE; DIAGNOSTIC, W/WO COLLECTION SPECIMEN BY BRUSH OR WASH;  Surgeon: Liane Comber, MD;  Location: [facility];  Service: Gastroenterology   ??? PR COLSC FLX W/RMVL OF TUMOR POLYP LESION SNARE TQ N/A 02/04/2021    Procedure: COLONOSCOPY FLEX; W/REMOV TUMOR/LES BY SNARE;  Surgeon: Rona Ravens, MD;  Location: GI PROCEDURES [facility];  Service: Gastroenterology   ??? PR LIGATN ANGIOACCESS AV FISTULA Right 10/21/2018    Procedure: LIGATION OR BANDING OF ANGIOACCESS ARTERIOVENOUS FISTULA, UPPER EXTREMITY;  Surgeon: Leona Carry, MD;  Location: MAIN OR [facility];  Service: Transplant   ??? PR NEPHRECTOMY Right 02/20/2014    Procedure: LAPAROSCOPY, SURGICAL; NEPHRECTOMY, INCLUDING PARTIAL URETERECTOMY;  Surgeon: Vivi Barrack, MD;  Location: MAIN OR [facility];  Service: Transplant   ??? PR REVISE AV FISTULA,W/O THROMBECTOMY Right 02/07/2019    Procedure: REVISON, OPEN, ARTERIOVENOUS FISTULA; WITHOUT THROMBECTOMY, AUTOGENOUS OR NONAUTOGENOUS DIALYSIS GRAFT;  Surgeon: Leona Carry, MD;  Location: MAIN OR [facility];  Service: Transplant   ??? TRANSPLANTATION RENAL         Family History:  Patient family history includes Alcohol abuse in his brother; Cancer in his father; Diabetes in his mother; Heart disease in his mother; Hypertension in his mother.    Social History:    Social History     Socioeconomic History   ??? Marital  status: Married   Tobacco Use   ??? Smoking status: Never Smoker   ??? Smokeless tobacco: Never Used   Substance and Sexual Activity   ??? Alcohol use: No     Alcohol/week: 0.0 standard drinks   ??? Drug use: No   ??? Sexual activity: Not Currently   Other Topics Concern   ??? Do you use sunscreen? No   ??? Tanning bed use? No   ??? Are you easily burned? No   ??? Excessive sun exposure? No   ??? Blistering sunburns? No   Social History Narrative    02/11/18: The patient lives with his wife, his daughter, and his two grandchildren in [place], [place]. 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 No history of Financial planner. No smoking history. No reported alcohol history.        Allergies:  Allergies   Allergen Reactions   ??? Penicillins Hives     Other reaction(s): HIVES       Medications:    Current Outpatient Medications:   ???  acyclovir (ZOVIRAX) 400 MG tablet, Take 1 tablet (400 mg total) by mouth daily., Disp: 90 tablet, Rfl: 3  ???  allopurinoL (ZYLOPRIM) 100 MG tablet, Take 1 tablet (100 mg total) by mouth daily., Disp: 30 tablet, Rfl: 11  ???  aspirin 81 MG chewable tablet, Chew 81 mg daily., Disp: , Rfl:   ???  atorvastatin (LIPITOR) 20 MG tablet, TAKE 1 TABLET BY MOUTH NIGHTLY., Disp: 30 tablet, Rfl: 11  ???  calcitrioL (ROCALTROL) 0.25 MCG capsule, TAKE 1 CAPSULE BY MOUTH EVERY MONDAY, WEDNESDAY, AND FRIDAY AT 4PM., Disp: 36 capsule, Rfl: 3  ???  colchicine (COLCRYS) 0.6 mg tablet, Take 1 tablet (0.6 mg total) by mouth daily as needed for up to 1 dose., Disp: 30 tablet, Rfl: 1  ???  enalapril (VASOTEC) 5 MG tablet, Take 1 tablet (5 mg total) by mouth Two (2) times a day., Disp: 180 tablet, Rfl: 3  ???  ferrous sulfate 325 (65 FE) MG tablet, Take 1 tablet (325 mg total) by mouth daily., Disp: , Rfl: 0  ???  fluticasone (FLONASE) 50 mcg/actuation nasal spray, 1 spray into each nostril daily as needed., Disp: , Rfl:   ???  furosemide (LASIX) 20 MG tablet, Take 40 mg in the morning and 20 mg in the evening., Disp: 270 tablet, Rfl: 3  ???  levothyroxine (SYNTHROID) 200 MCG tablet, Take 1 tab with two tab, total dose daily, Disp: 90 tablet, Rfl: 3  ???  levothyroxine (SYNTHROID) 25 MCG tablet, TAKE (2) TABLETS BY MOUTH EVERY DAY WITH1 TAB FOR A TOTAL DAILY DOSE OF , Disp: 180 tablet, Rfl: 3  ???  magnesium oxide (MAG-OX) 400 mg (241.3 mg elemental magnesium) tablet, Take 1 tablet (400 mg total) by mouth Two (2) times a day., Disp: 60 tablet, Rfl: 11  ???  metoprolol succinate (TOPROL-XL) 100 MG 24 hr tablet, Take 1 tablet (100 mg total) by mouth daily., Disp: 90 tablet, Rfl: 3  ???  mycophenolate (CELLCEPT) 250 mg capsule, Take 1 capsule (250 mg total) by mouth Two (2) times a day., Disp: 60 capsule, Rfl: 11  ???  omeprazole (PRILOSEC) 20 MG capsule, Take 1 capsule (20 mg total) by mouth in the morning., Disp: 90 capsule, Rfl: 3  ???  predniSONE (DELTASONE) 5 MG tablet, Take 1 tablet (5 mg total) by  mouth daily., Disp: 90 tablet, Rfl: 3  ???  simethicone (GAS-X) 80 MG chewable tablet, Chew 1 tablet (80 mg total) every six (6) hours as needed for flatulence., Disp: 30 tablet, Rfl: 1  ???  tacrolimus (PROGRAF) 1 MG capsule, Take 3 capsules (3 mg total) by mouth every morning AND 2 capsules (2 mg total) nightly., Disp: 150 capsule, Rfl: 11     Objective:     Physical Exam  Deferred (virtual visit)    Pertinent Laboratory Values:   Lab Results   Component Value Date    WBC 5.8 05/08/2021    HGB 11.0 (L) 05/08/2021    HCT 34.6 (L) 05/08/2021    PLT 100 (L) 05/08/2021       Lab Results   Component Value Date    NA 141 05/08/2021    K 4.2 05/08/2021    CL 108 (H) 05/08/2021    CO2 26.0 05/08/2021    BUN 58 (H) 05/08/2021    CREATININE 2.50 (H) 05/08/2021    GLU 94 05/08/2021    CALCIUM 9.8 05/08/2021    MG 2.0 01/08/2021    PHOS 3.3 01/08/2021       Lab Results   Component Value Date    BILITOT 0.6 05/08/2021    BILIDIR 0.20 09/08/2019    PROT 7.0 05/08/2021    ALBUMIN 3.4 05/08/2021    ALT 21 05/08/2021    AST 22 05/08/2021    ALKPHOS 136 (H) 05/08/2021    GGT 30 01/26/2014       Lab Results   Component Value Date    LABPROT 12.7 (H) 05/23/2014    INR 1.45 10/17/2018    APTT 35.3 10/17/2018       Imaging Studies: MRI 01/30/2021 and [place] 04/02/2021 images personally reviewed and interpreted. There is a 2x2 enhancing mass in the inferior aspect of the RLQ graft.        Assessment:   Miguel Ward is a 70 y.o. male who presents with an enhancing mass in the RLQ transplant.    Plan (Medical Decision Making):   I discussed the various treatment options for small renal masses. Given the nature of the patient's disease, we feel that the he would be best treated with percutaneous cryoablation at this time. The risks, benefits and alternatives were fully discussed including bleeding, infection, and damage to adjacent structures/organs.      All of the patient's questions answered to his satisfaction. After the review of risks and benefits outlined above,he indicated a desire to proceed. The procedure will be scheduled at the earliest mutually agreeable and available date.        The patient reports they are currently: at home. I spent 9 minutes on the phone with the patient on the date of service. I spent an additional 10 minutes on pre- and post-visit activities on the date of service.     The patient was physically located in [place] or a state in which I am permitted to provide care. The patient and/or parent/guardian understood that s/he may incur co-pays and cost sharing, and agreed to the telemedicine visit. The visit was reasonable and appropriate under the circumstances given the patient's presentation at the time.    The patient and/or parent/guardian has been advised of the potential risks and limitations of this mode of treatment (including, but not limited to, the absence of in-person examination) and has agreed to be treated using telemedicine. The patient's/patient's family's questions regarding telemedicine have  been answered.     If the visit was completed in an ambulatory setting, the patient and/or parent/guardian has also been advised to contact their provider???s office for worsening conditions, and seek emergency medical treatment and/or call 911 if the patient deems either necessary.        Georgian Co, MD, PhD  Assistant Professor of Radiology  [facility] [employer] [facility]

## 2021-05-22 NOTE — Unmapped (Signed)
Mr. Siems called at 10:43am and stated that he had a 9:45 appt. but was not called.

## 2021-05-27 MED ORDER — FUROSEMIDE 20 MG TABLET
ORAL_TABLET | 3 refills | 0 days | Status: CP
Start: 2021-05-27 — End: ?

## 2021-06-02 ENCOUNTER — Ambulatory Visit
Admit: 2021-06-02 | Discharge: 2021-06-03 | Payer: MEDICARE | Attending: Student in an Organized Health Care Education/Training Program | Primary: Student in an Organized Health Care Education/Training Program

## 2021-06-02 ENCOUNTER — Ambulatory Visit
Admit: 2021-06-02 | Discharge: 2021-06-05 | Payer: MEDICARE | Attending: Speech-Language Pathologist | Primary: Speech-Language Pathologist

## 2021-06-02 DIAGNOSIS — R059 Cough, unspecified: Secondary | ICD-10-CM | POA: Diagnosis not present

## 2021-06-02 DIAGNOSIS — R490 Dysphonia: Secondary | ICD-10-CM | POA: Diagnosis not present

## 2021-06-02 NOTE — Unmapped (Signed)
Flexible endoscope serial number [id number] used today by [name], MD

## 2021-06-02 NOTE — Unmapped (Signed)
Otolaryngology Established Patient Visit    Reason for visit:  Mr. Miguel Ward is seen in follow-up    History of Present Illness:  [name] is a 70 y.o. year old male who initially presented to me with a 2 months history of dysphonia 01/2021.    01/27/2021: The patient reports his voice has become more weak and high pitched. He notes these changes have come on gradually, but have become very noticeable over the last 2 months. He states he has lost his lower register with speaking voice. 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 He denies SOB, dysphagia, odynophagia, fevers, and chills.     The patient also endorses a cough that began with an illness in October 2021. He states he developed a violent cough and was diagnosed with bronchitis. He was treated with Doxycycline. His cough has since been improved but he continues to have intermittent episodes of coughing fits. He no longer experiences the violent cough. The patient states he feels mucus in his throat at night when he lies down, which makes his cough worse. Other triggers include certain odors and air conditioning. These bring about a sensation of a tickle in his throat.    Findings and diagnoses at this visit- dysphonia, left vocal scar, mtd, vcd type cough and hx of lupus nephritis and rcc s/p transplant. Recommended a course of voice therapy for resp retraining and voice.     06/02/2021: The patient has completed several sessions of speech therapy with [facility]. The patient reports that his cough and voice has improved significantly. He states that his post-nasal drip has resolved as well. He is quite pleased with his progress and results. He states he can now sing again as well.     Past Medical History:  Past Medical History:   Diagnosis Date   ??? Anemia    ??? CHF (congestive heart failure) (CMS-HCC)    ??? Coronary artery disease    ??? Disease of thyroid gland    ??? GERD (gastroesophageal reflux disease)    ??? Gout    ??? Hyperlipidemia    ??? Hypertension    ??? Lupus nephritis (CMS-HCC)    ??? NSTEMI (non-ST elevated myocardial infarction) (CMS-HCC)    ??? Red blood cell antibody positive     anti-K   ??? Renal cancer, right (CMS-HCC)    ??? Renal transplant, status post        Past Surgical History:  Past Surgical History:   Procedure Laterality Date   ??? AV FISTULA PLACEMENT Right    ??? HERNIA REPAIR     ??? PR BREATH HYDROGEN TEST N/A 06/24/2016    Procedure: BREATH HYDROGEN TEST;  Surgeon: Nurse-Based Giproc;  [facility];  Service: Gastroenterology   ??? PR COLONOSCOPY FLX DX W/COLLJ SPEC WHEN PFRMD N/A 05/22/2016    Procedure: COLONOSCOPY, FLEXIBLE, PROXIMAL TO SPLENIC FLEXURE; DIAGNOSTIC, W/WO COLLECTION SPECIMEN BY BRUSH OR WASH;  Surgeon: Liane Comber, MD;  Location: [facility];  Service: Gastroenterology   ??? PR COLSC FLX W/RMVL OF TUMOR POLYP LESION SNARE TQ N/A 02/04/2021    Procedure: COLONOSCOPY FLEX; W/REMOV TUMOR/LES BY SNARE;  Surgeon: Rona Ravens, MD;  Location: GI PROCEDURES [facility];  Service: Gastroenterology   ??? PR LIGATN ANGIOACCESS AV FISTULA Right 10/21/2018    Procedure: LIGATION OR BANDING OF ANGIOACCESS ARTERIOVENOUS FISTULA, UPPER EXTREMITY;  Surgeon: Leona Carry, MD;  Location:  MAIN OR [facility];  Service: Transplant   ??? PR NEPHRECTOMY Right 02/20/2014    Procedure: LAPAROSCOPY, SURGICAL; NEPHRECTOMY, INCLUDING PARTIAL URETERECTOMY;  Surgeon: Vivi Barrack, MD;  Location: MAIN OR [facility];  Service: Transplant   ??? PR REVISE AV FISTULA,W/O THROMBECTOMY Right 02/07/2019    Procedure: REVISON, OPEN, ARTERIOVENOUS FISTULA; WITHOUT THROMBECTOMY, AUTOGENOUS OR NONAUTOGENOUS DIALYSIS GRAFT;  Surgeon: Leona Carry, MD;  Location: MAIN OR [facility];  Service: Transplant   ??? TRANSPLANTATION RENAL         Medications:    Current Outpatient Medications:   ???  acyclovir (ZOVIRAX) 400 MG tablet, Take 1 tablet (400 mg total) by mouth daily., Disp: 90 tablet, Rfl: 3  ???  allopurinoL (ZYLOPRIM) 100 MG tablet, Take 1 tablet (100 mg total) by mouth daily., Disp: 30 tablet, Rfl: 11  ???  aspirin 81 MG chewable tablet, Chew 81 mg daily., Disp: , Rfl:   ???  atorvastatin (LIPITOR) 20 MG tablet, TAKE 1 TABLET BY MOUTH NIGHTLY., Disp: 30 tablet, Rfl: 11  ???  calcitrioL (ROCALTROL) 0.25 MCG capsule, TAKE 1 CAPSULE BY MOUTH EVERY MONDAY, WEDNESDAY, AND FRIDAY AT 4PM., Disp: 36 capsule, Rfl: 3  ???  colchicine (COLCRYS) 0.6 mg tablet, Take 1 tablet (0.6 mg total) by mouth daily as needed for up to 1 dose., Disp: 30 tablet, Rfl: 1  ???  enalapril (VASOTEC) 5 MG tablet, Take 1 tablet (5 mg total) by mouth Two (2) times a day., Disp: 180 tablet, Rfl: 3  ???  ferrous sulfate 325 (65 FE) MG tablet, Take 1 tablet (325 mg total) by mouth daily., Disp: , Rfl: 0  ???  fluticasone (FLONASE) 50 mcg/actuation nasal spray, 1 spray into each nostril daily as needed., Disp: , Rfl:   ???  furosemide (LASIX) 20 MG tablet, Take 40 mg in the morning and 20 mg in the evening., Disp: 270 tablet, Rfl: 3  ???  levothyroxine (SYNTHROID) 200 MCG tablet, Take 1 tab with two tab, total dose daily, Disp: 90 tablet, Rfl: 3  ???  levothyroxine (SYNTHROID) 25 MCG tablet, TAKE (2) TABLETS BY MOUTH EVERY DAY WITH1 TAB FOR A TOTAL DAILY DOSE OF , Disp: 180 tablet, Rfl: 3  ???  magnesium oxide (MAG-OX) 400 mg (241.3 mg elemental magnesium) tablet, Take 1 tablet (400 mg total) by mouth Two (2) times a day., Disp: 60 tablet, Rfl: 11  ???  metoprolol succinate (TOPROL-XL) 100 MG 24 hr tablet, Take 1 tablet (100 mg total) by mouth daily., Disp: 90 tablet, Rfl: 3  ???  mycophenolate (CELLCEPT) 250 mg capsule, Take 1 capsule (250 mg total) by mouth Two (2) times a day., Disp: 60 capsule, Rfl: 11  ???  omeprazole (PRILOSEC) 20 MG capsule, Take 1 capsule (20 mg total) by mouth in the morning., Disp: 90 capsule, Rfl: 3  ???  predniSONE (DELTASONE) 5 MG tablet, Take 1 tablet (5 mg total) by mouth daily., Disp: 90 tablet, Rfl: 3  ???  simethicone (GAS-X) 80 MG chewable tablet, Chew 1 tablet (80 mg total) every six (6) hours as needed for flatulence., Disp: 30 tablet, Rfl: 1  ???  tacrolimus (PROGRAF) 1 MG capsule, Take 3 capsules (3 mg total) by mouth every morning AND 2 capsules (2 mg total) nightly., Disp: 150 capsule, Rfl: 11     Allergies:  Allergies   Allergen Reactions   ??? Penicillins Hives     Other reaction(s): HIVES       Family History:  Family History  Problem Relation Age of Onset   ??? Heart disease Mother    ??? Diabetes Mother    ??? Hypertension Mother    ??? Cancer Father    ??? Alcohol abuse Brother    ??? Cardiomyopathy Neg Hx    ??? Sudden Cardiac Death Neg Hx    ??? Melanoma Neg Hx    ??? Basal cell carcinoma Neg Hx    ??? Squamous cell carcinoma Neg Hx    ??? Anesthesia problems Neg Hx        Social History:  Social History     Tobacco Use   ??? Smoking status: Never Smoker   ??? Smokeless tobacco: Never Used   Substance Use Topics   ??? Alcohol use: No     Alcohol/week: 0.0 standard drinks   ??? Drug use: No       Review of Systems:  The balance of 11 systems were reviewed and are negative except as noted in HPI or on intake form.     Physical Exam:   Constitutional:  Vitals reviewed in chart, patient has normal appearance. Well nourished, well-developed, no acute distress  Voice:  Asthenic   Respiration:  Breathing comfortably, no stridor.  CV: No clubbing/cyanosis/edema in hands.   Eyes:  extraocular motion intact, sclera normal.   Neuro:  Alert and oriented times 3, Cranial nerves 2-12 intact and symmetric bilaterally.   Head and Face:  Skin with no masses or lesions, sinuses nontender to palpation, facial nerve fully intact.   Salivary Glands:  Parotid and submandibular glands normal bilaterally.   Ears:  Normal external ears  Nose:  External nose midline, anterior rhinoscopy is normal with limited visualization just to the anterior interior turbinate.   Oral Cavity/Oropharynx/Lips:  Normal mucous membranes, normal floor of mouth/tongue/oropharynx, no masses or lesions are noted.  Good dentition  Pharyngeal Walls:  No masses noted.  Neck/Lymph:  No lymphadenopathy, no thyroid masses.  Larynx: Mirror evaluation insufficient to visualize secondary to prominent gag      Procedure Note    Endoscopy Type:  Flexible Fiberoptic Videostroboscopy    Indications/TimeOut:  To better evaluate the patient???s symptoms, fiberoptic videostroboscopy is indicated.   A time out identifying the patient, the procedure, the location of the procedure and any concerns was performed prior to beginning the procedure.    Procedure Details:    The patient was placed in the sitting position.  After topical anesthesia and decongestion with oxymetazoline and 1% lidocaine, the flexible laryngoscope was passed.  The microphone was used to trigger the xenon stroboscopic light source.    -  Nasal cavity  - There was no pus or polyps noted in the nasal cavity. Septum intact  -  Nasopharynx/Oropharynx - there was no evidence of mass or lesion.  -  Hypopharynx - There were no lesions in the pyriformis, pharyngeal walls, or postcricoid space  -  Larynx -  there was mild interarytenoid edema, no erythema. No lesions of the epiglottis, false folds, or aryepiglottic folds  -  Vocal Folds -     Mobility: Normal bilaterally   Amplitude: Right greater than Left   Mucosal Wave: Left mucosal wave diminished   Closure: Complete   Lesions/Other findings: mild muscle tension dysphonia  and left vocal scar  - Subglottis - Normal    Condition:  Stable.  Patient tolerated procedure well.    Complications:  None      Voice- Related Quality of Life (VR-QOL) Measure:  I have trouble  speaking loudly or being heard in noisy situations: 1  I run out of air and need to take frequent breaths when talking: 1  I sometimes do not know what will come out when I begin speaking: 1  I am sometimes anxious or frustrated because of my voice: 1  I sometimes get depressed because of my voice: 1  I have trouble using the telephone because of my voice: 1  I have trouble doing my job or practicing my profession because of my voice: 1  I avoid going out socially because of my voice: 1  I have to repeat myself to be understood: 1  I have become less outgoing because of my voice: 1  VRQOL Raw Score: 10  Calculated Score: 100    Glottal Function Index:  Speaking took extra effort: 0  Throat discomfort or pain after using your voice: 0  Vocal fatigue (voice weakened as you talked): 0  Voice cracks or sounds different: 0  Glottal Function Index Total: 0      Imaging/tests results: None    Voice Evaluation:   A Speech Language Pathology evaluation was ordered and deemed necessary and standard of care.     Assessment:   [name] is a 70 y.o. year old male who presents today with symptoms of dysphonia    Findings and diagnoses include:  Dysphonia  Left vocal scar  MTD  VCD type cough  Hx of lupus nephritis and renal cell carcinoma s/p transplant    S/p completion of speech therapy with speech language pathology on 04/28/2021 for resp retraining and voice with significant improvement/resolution of voice complaint and cough.       Plan:  - Encouraged improved hydration given dryness seen today.  - Follow-up with me as needed     Scribe's Attestation: Madysen Faircloth N. Sherryll Burger, MD obtained and performed the history, physical exam and medical decision making elements that were entered into the chart. Signed by Sherlie Ban, Scribe, on June 02, 2021 at 11:26 AM.        The documentation recorded by the scribe accurately reflects the service I personally performed and the decisions made by me. Christene Lye, MD

## 2021-06-05 NOTE — Unmapped (Signed)
[facility] SPEECH [address]  OUTPATIENT SPEECH PATHOLOGY  06/02/2021      Patient Name: Miguel Ward  Date of Birth:[birth year]  Session Number: 6  Diagnosis:   Encounter Diagnosis   Name Primary?   ??? Dysphonia            Date of Evaluation: 01/27/21  Date of Symptom Onset: 12/13/20  Referred by: Dr. [name]              Chief Complaint: Dysphonia    Note Type: Discharge Note    ASSESSMENT:  Discharge Summary: Miguel Ward returns today with report that he is no longer anxious about his voice. Skilled SLP services included review of HEP and discharge counseling. Pt participated well with high level of motivation, making good progress and meeting all goals. Pt also demonstrates ability to carryover target voice independently. Level of Function at the end of Tx: mild dysphonia. Voice characterized by intermittent pitch breaks; otherwise, good resonance and good breath support. Pt able to participate in ADLs. Initial GRBAS: 7/15 (0=WNL, 15=severe). Today's GRBAS: 4/15. Pt moved from moderate dysphonia to mild       OBJECTIVE: OBJECTIVE  Voice - General   Hydration: 32 oz a day  Caffeine Use: None  Onset: Sudden  Daily Voice Use: Minimal demand   Acoustic Measures  Connected Sample: CAPE-V Sentences  Connected Sample 1 (Hz): 125.36 Hz   Total Fundamental Frequency  Total Fundamental Frequency Range (Hz): 111.41 Hz  to (Hz): 423.22 Hz   Sustained Phonation  Mean Fund Freq (Hz): 117.41 Hz  Relative Average Perturbation (RAP) %: 0.76 %  Noise-to-Harmonic Ratio (NHR): 0.13  Degree of Subharmonics (DSH) %: 0 %  Degree of Voiceless (DUV) %: 0 %  Soft Phonation Index (SPI) : 17.82   Aerodynamic Measures  Maximum phonation time on /a/ in sec:: 12.5 sec  Mean Flow Rate (mL/sec): 230 mL/sec  at (Hz): 135.35 Hz  and (dB): 85.22 dB  Pulmonary Breath Support: None  Primary Breath Support: Diaphragmatic  Speaks on functional residual capacity?: No       Perceptual Measures  Grade:: 2  Rough:: 1  Breathy:: 1  Asthenic:: 0  Strained:: 1  Total:: 5 /15   Tremor / Rate of Speech  Tremor:: Absent  Rate of Speech:: WFL   Subjective Measures  Glottal Function Index (GFI):: 0  Voice-related Quality of Life (V-RQOL):: 100    Stimulability: Pt was very stimulable  Treatment Recommendations: Discharge from therapy    PLAN: Follow-up with SLP as needed         Prognosis:  Good    Negative Prognosis Rationale: Time post onset, Severity of deficits       Positive Prognosis Rationale: Behavior, Motivation      Goals:        STG 1: Breathing: Pt will demonstrate abdominal breathing for support of phonation with 80% accuracy given minimal cues    STG 2: Resonance: Pt will balance oral/nasal resonance to maximize vocal output with minimal effort/laryngeal hyperfunction with 80% accuracy given minimal cues    STG 3: Strength/balance: Pt will strengthen and balance laryngeal musculature for support of connected speech with 80% accuracy given minimal cues    STG 4: Inflection: Pt. will increase inflection, improve phonatory flexibility, and increase laryngeal control without laryngeal hyperfunction with 80% accuracy given minimal cues    STG 5: VCD/Cough: Pt will minimize or eliminate cough and/or Vocal Cord Dysfunction by implementing learned strategies with 100%  accuracy given minimal cues                   Time Frame: 3  Duration: months    LTG #1: Pt will report improved functional voice for ADL's without the following symptoms: chronic hoarseness, vocal fatigue, pain with extended voice use, decreased volume, and intermittent voice loss (+)    LTG #2: Pt will report employment of VCD strategies in order to minimize chronic cough, vocal cord dysfunction, and/or laryngeal spasm                                  Time Frame: 3  Duration: months       SUBJECTIVE:  Patient reports that he is happy with his voice and feels that it has returned back to normal  Pain?: No      Precautions: None      Education Provided: Patient, SLP Plan of Care    Response to Education: Understanding verbalized     Communication/Consultation: n/a    Session Duration : 20    Today's Charges (noted here with $$):  SLP Treatment Charges  $$ Treatment S/L/V - individual [mins]: 20                   I attest that I have reviewed the above information.  Signed: Genene Churn, SLP  06/02/2021 3:58 PM     I was physically present and immediately available to direct and supervise tasks that were related to patient management by Graduate Student Intern, Melanie Exum, BS. The direction and supervision was continuous throughout the time these tasks were performed.     Milton Ferguson, [place], [facility] -SLP    Patient removed their mask for ~2 minute(s) to complete diagnostic or therapeutic tasks. SLP and student wore mask and eye protection throughout session.

## 2021-06-17 NOTE — Unmapped (Signed)
unos form

## 2021-06-18 NOTE — Unmapped (Signed)
[facility] Refill Coordination Note    Specialty Medication(s) to be Shipped:   Transplant: tacrolimus 1mg     Other medication(s) to be shipped: No additional medications requested for fill at this time     Miguel Ward, DOB: 1951/07/12  Phone: [phone] (home)       All above HIPAA information was verified with patient.     Was a Nurse, learning disability used for this call? No    Completed refill call assessment today to schedule patient's medication shipment from the [facility] [phone]).  All relevant notes have been reviewed.     Specialty medication(s) and dose(s) confirmed: Regimen is correct and unchanged.   Changes to medications: Miguel Ward reports no changes at this time.  Changes to insurance: No  New side effects reported not previously addressed with a pharmacist or physician: None reported  Questions for the pharmacist: No    Confirmed patient received a Conservation officer, historic buildings and a Surveyor, mining with first shipment. The patient will receive a drug information handout for each medication shipped and additional FDA Medication Guides as required.       DISEASE/MEDICATION-SPECIFIC INFORMATION        N/A    SPECIALTY MEDICATION ADHERENCE     Medication Adherence    Patient reported X missed doses in the last month: 0  Specialty Medication: tacrolimus 1mg   Adherence tools used: patient uses a pill box to manage medications, calendar              Were doses missed due to medication being on hold? No    Tacrolimus 1mg   : 5 days of medicine on hand       REFERRAL TO PHARMACIST     Referral to the pharmacist: Not needed      [facility]     Shipping address confirmed in Epic.     Delivery Scheduled: Yes, Expected medication delivery date: 06/20/2021.     Medication will be delivered via UPS to the prescription address in Epic WAM.    Miguel Ward   [facility] Specialty Pharmacist

## 2021-06-19 MED FILL — TACROLIMUS 1 MG CAPSULE, IMMEDIATE-RELEASE: ORAL | 30 days supply | Qty: 150 | Fill #5

## 2021-06-20 DIAGNOSIS — Z940 Kidney transplant status: Principal | ICD-10-CM

## 2021-06-20 MED ORDER — MYCOPHENOLATE MOFETIL 250 MG CAPSULE
ORAL_CAPSULE | Freq: Two times a day (BID) | ORAL | 11 refills | 30 days | Status: CP
Start: 2021-06-20 — End: 2022-06-20

## 2021-06-20 NOTE — Unmapped (Signed)
[facility] Specialty Medication Onboarding    Specialty Medication: Mycophenolate 250mg  capsule  Prior Authorization: Not Required   Financial Assistance: No - copay  <$25  Final Copay/Day Supply: $7.73 / 30 days    Insurance Restrictions: None     Notes to Pharmacist: Rx Comments indicate this is for [facility] Assistance renewal, but doesn't meet criteria for referral.    The triage team has completed the benefits investigation and has determined that the patient is able to fill this medication at [facility]. Please contact the patient to complete the onboarding or follow up with the prescribing physician as needed.

## 2021-06-20 NOTE — Unmapped (Signed)
This onboarding is for the following medication:  1) Cellcept        [facility]   Patient Onboarding/Medication Counseling    Miguel Ward is a 70 y.o. male with a kidney transplant who I am counseling today on continuation of therapy.  I am speaking to the patient.    Was a Nurse, learning disability used for this call? No    Verified patient's date of birth / HIPAA.    Specialty medication(s) to be sent: Transplant: None- patient will get from [facility] in July and August and will fill at [facility] starting in Sept.      Non-specialty medications/supplies to be sent: none      Medications not needed at this time: none     The patient declined counseling on missed dose instructions, goals of therapy, side effects and monitoring parameters, warnings and precautions, drug/food interactions and storage, handling precautions, and disposal because they have taken the medication previously. The information in the declined sections below are for informational purposes only and was not discussed with patient.       Cellcept (mycopheonlate mofetil)    Medication & Administration     Dosage: Take 1 capsule (250mg ) by mouth two times daily    Administration:   ??? Take by mouth with or without food.   ??? Taking with food can minimize GI side effects.   ??? Swallow capsules whole, do not crush or chew.  ??? Oral suspension should be shaken well prior to administration.  Do not mix with other medications and discard any unused portion 60 days after constitution.      Adherence/Missed dose instructions:  Take a missed dose as soon as you remember it . If it is close to the time of your next dose, skip the missed dose and resume your normal schedule.Never take 2 doses to try and catch up from a missed dose.    Goals of Therapy     Prevent organ rejection    Side Effects & Monitoring Parameters     ??? Feeling tired or weak  ??? Shakiness  ??? Trouble sleeping  ??? Diarrhea, abdominal pain, nausea, vomiting, constipation or decreased appetite  ??? Decreases in blood counts   ??? Back or joint pain  ??? Hypertension or hypotension  ??? High blood sugar  ??? Headache  ??? Skin rash    The following side effects should be reported to the provider:  ??? Reduced immune function - report signs of infection such as fever; chills; body aches; very bad sore throat; ear or sinus pain; cough; more sputum or change in color of sputum; pain with passing urine; wound that will not heal, etc.  Also at a slightly higher risk of some malignancies (mainly skin and blood cancers) due to this reduced immune function.  ??? Allergic reaction (rash, hives, swelling, shortness of breath)  ??? High blood sugar (confusion, feeling sleepy, more thirst, more hungry, passing urine more often, flushing, fast breathing, or breath that smells like fruit)  ??? Electrolyte issues (mood changes, confusion, muscle pain or weakness, a heartbeat that does not feel normal, seizures, not hungry, or very bad upset stomach or throwing up)  ??? High or low blood pressure (bad headache or dizziness, passing out, or change in eyesight)  ??? Kidney issues (unable to pass urine, change in how much urine is passed, blood in the urine, or a big weight gain)  ??? Skin (oozing, heat, swelling, redness, or pain), UTI and other infections   ???  Chest pain or pressure  ??? Abnormal heartbeat  ??? Unexplained bleeding or bruising  ??? Abnormal burning, numbness, or tingling  ??? Muscle cramps,  ??? Yellowing of skin or eyes    Monitoring parameters  ??? Pregnancy   ??? CBC   ??? Renal and hepatic function    Contraindications, Warnings, & Precautions     ??? *This is a REMS drug and an FDA-approved patient medication guide will be printed with each dispensation  ??? Black Box Warning: Infections   ??? Black Box Warning: Lymphoproliferative disorders - risk of development of lymphoma and skin malignancy is increased  ??? Black Box Warning: Use during pregnancy is associated with increased risks of first trimester pregnancy loss and congenital malformations.   ??? Black Box Warning: Females of reproductive potential should use contraception during treatment and for 6 weeks after therapy is discontinued  ??? CNS depression  ??? New or reactivated viral infections  ??? Neutropenia  ??? Male patients and/or their male partners should use effective contraception during treatment of the male patient and for at least 3 months after last dose.  ??? Breastfeeding is not recommended during therapy and for 6 weeks after last dose    Drug/Food Interactions     ??? Medication list reviewed in Epic. The patient was instructed to inform the care team before taking any new medications or supplements. No drug interactions identified.   ??? Separate doses of antacids and this medication  ??? Check with your doctor before getting any vaccinations    Storage, Handling Precautions, & Disposal     ??? Store at room temperature in a dry place  ??? This medication is considered hazardous. Wash hands after handling and store out of reach or others, including children and pets.        Current Medications (including OTC/herbals), Comorbidities and Allergies     Current Outpatient Medications   Medication Sig Dispense Refill   ??? acyclovir (ZOVIRAX) 400 MG tablet Take 1 tablet (400 mg total) by mouth daily. 90 tablet 3   ??? allopurinoL (ZYLOPRIM) 100 MG tablet Take 1 tablet (100 mg total) by mouth daily. 30 tablet 11   ??? aspirin 81 MG chewable tablet Chew 81 mg daily.     ??? atorvastatin (LIPITOR) 20 MG tablet TAKE 1 TABLET BY MOUTH NIGHTLY. 30 tablet 11   ??? calcitrioL (ROCALTROL) 0.25 MCG capsule TAKE 1 CAPSULE BY MOUTH EVERY MONDAY, WEDNESDAY, AND FRIDAY AT 4PM. 36 capsule 3   ??? colchicine (COLCRYS) 0.6 mg tablet Take 1 tablet (0.6 mg total) by mouth daily as needed for up to 1 dose. 30 tablet 1   ??? enalapril (VASOTEC) 5 MG tablet Take 1 tablet (5 mg total) by mouth Two (2) times a day. 180 tablet 3   ??? ferrous sulfate 325 (65 FE) MG tablet Take 1 tablet (325 mg total) by mouth daily.  0   ??? fluticasone (FLONASE) 50 mcg/actuation nasal spray 1 spray into each nostril daily as needed.     ??? furosemide (LASIX) 20 MG tablet Take 40 mg in the morning and 20 mg in the evening. 270 tablet 3   ??? levothyroxine (SYNTHROID) 200 MCG tablet Take 1 tab with two tab, total dose daily 90 tablet 3   ??? levothyroxine (SYNTHROID) 25 MCG tablet TAKE (2) TABLETS BY MOUTH EVERY DAY WITH1 TAB FOR A TOTAL DAILY DOSE OF 180 tablet 3   ??? magnesium oxide (MAG-OX) 400 mg (241.3 mg elemental magnesium)  tablet Take 1 tablet (400 mg total) by mouth Two (2) times a day. 60 tablet 11   ??? metoprolol succinate (TOPROL-XL) 100 MG 24 hr tablet Take 1 tablet (100 mg total) by mouth daily. 90 tablet 3   ??? mycophenolate (CELLCEPT) 250 mg capsule Take 1 capsule (250 mg total) by mouth Two (2) times a day. 60 capsule 11   ??? omeprazole (PRILOSEC) 20 MG capsule Take 1 capsule (20 mg total) by mouth in the morning. 90 capsule 3   ??? predniSONE (DELTASONE) 5 MG tablet Take 1 tablet (5 mg total) by mouth daily. 90 tablet 3   ??? simethicone (GAS-X) 80 MG chewable tablet Chew 1 tablet (80 mg total) every six (6) hours as needed for flatulence. 30 tablet 1   ??? tacrolimus (PROGRAF) 1 MG capsule Take 3 capsules (3 mg total) by mouth every morning AND 2 capsules (2 mg total) nightly. 150 capsule 11     No current facility-administered medications for this visit.       Allergies   Allergen Reactions   ??? Penicillins Hives     Other reaction(s): HIVES       Patient Active Problem List   Diagnosis   ??? Coronary atherosclerosis of native coronary artery   ??? Postsurgical percutaneous transluminal coronary angioplasty status   ??? Hypercholesterolemia   ??? Hypertension   ??? History of kidney transplant   ??? Red blood cell antibody positive   ??? Renal cell carcinoma (CMS-HCC)   ??? Rash of back   ??? Fever   ??? Immunosuppression-related infectious disease (CMS-HCC)   ??? Pneumonia due to infectious organism   ??? Bilateral lower extremity edema   ??? Elevated troponin I level   ??? Normocytic anemia   ??? Gout   ??? GERD (gastroesophageal reflux disease)   ??? RSV (respiratory syncytial virus infection)   ??? Diastolic heart failure (CMS-HCC)   ??? Systolic heart failure (CMS-HCC)   ??? NSTEMI (non-ST elevated myocardial infarction) (CMS-HCC)   ??? Acute on chronic HFrEF (heart failure with reduced ejection fraction) (CMS-HCC)       Reviewed and up to date in Epic.    Appropriateness of Therapy     Acute infections noted within Epic:  No active infections  Patient reported infection: None    Is medication and dose appropriate based on diagnosis and infection status? Yes    Prescription has been clinically reviewed: Yes      Baseline Quality of Life Assessment      How many days over the past month did your kidney transplant  keep you from your normal activities? For example, brushing your teeth or getting up in the morning. 0    Financial Information     Medication Assistance provided: None Required    Anticipated copay of $7.73 reviewed with patient. Verified delivery address.    Delivery Information     Scheduled delivery date: Patient is getting from [facility] until 07/30/21 and does not need today.    Expected start date: Patient has medication at home and is taking.    Medication will be delivered via UPS to the prescription address in [facility].  This shipment will not require a signature.      Explained the services we provide at [facility] and that each month we would call to set up refills.  Stressed importance of returning phone calls so that we could ensure they receive their medications in time each month.  Informed patient  that we should be setting up refills 7-10 days prior to when they will run out of medication.  A pharmacist will reach out to perform a clinical assessment periodically.  Informed patient that a welcome packet, containing information about our pharmacy and other support services, a Notice of Privacy Practices, and a drug information handout will be sent.      The patient or caregiver noted above participated in the development of this care plan and knows that they can request review of or adjustments to the care plan at any time.      Patient or caregiver verbalized understanding of the above information as well as how to contact the pharmacy at [phone] option 4 with any questions/concerns.  The pharmacy is open Monday through Friday 8:30am-4:30pm.  A pharmacist is available 24/7 via pager to answer any clinical questions they may have.    Patient Specific Needs     - Does the patient have any physical, cognitive, or cultural barriers? No    - Does the patient have adequate living arrangements? (i.e. the ability to store and take their medication appropriately) Yes    - Did you identify any home environmental safety or security hazards? No    - Patient prefers to have medications discussed with  Patient     - Is the patient or caregiver able to read and understand education materials at a high school level or above? Yes    - Patient's primary language is  English     - Is the patient high risk? Yes, patient is taking a REMS drug. Medication is dispensed in compliance with REMS program    - Does the patient require physician intervention or other additional services (i.e. dietary/nutrition, smoking cessation, social work)? No      [name]  [facility] Specialty Pharmacist

## 2021-06-30 MED ORDER — ALLOPURINOL 100 MG TABLET
ORAL_TABLET | Freq: Every day | ORAL | 11 refills | 30 days | Status: CP
Start: 2021-06-30 — End: ?

## 2021-06-30 NOTE — Unmapped (Signed)
Pre-call completed for VIR procedure.   Pt has prep instructions and understands them as well as pre-procedure diet restrictions.  Medication guidelines day of procedure reviewed.    NPO status, need for driver, arrival time/location reviewed.  All questions addressed and pt verbalizes understanding.    Covid screening questions reviewed.  Visitor information documented in pre-op checklist.    ASA 81 mg held since 7/17.

## 2021-07-02 ENCOUNTER — Ambulatory Visit: Admit: 2021-07-02 | Discharge: 2021-07-02 | Payer: MEDICARE

## 2021-07-02 DIAGNOSIS — T8619 Other complication of kidney transplant: Principal | ICD-10-CM

## 2021-07-02 DIAGNOSIS — C641 Malignant neoplasm of right kidney, except renal pelvis: Principal | ICD-10-CM

## 2021-07-02 DIAGNOSIS — N2889 Other specified disorders of kidney and ureter: Principal | ICD-10-CM

## 2021-07-02 DIAGNOSIS — Y830 Surgical operation with transplant of whole organ as the cause of abnormal reaction of the patient, or of later complication, without mention of misadventure at the time of the procedure: Secondary | ICD-10-CM | POA: Diagnosis not present

## 2021-07-02 DIAGNOSIS — I2510 Atherosclerotic heart disease of native coronary artery without angina pectoris: Secondary | ICD-10-CM | POA: Diagnosis not present

## 2021-07-02 DIAGNOSIS — M3214 Glomerular disease in systemic lupus erythematosus: Secondary | ICD-10-CM | POA: Diagnosis not present

## 2021-07-02 DIAGNOSIS — E785 Hyperlipidemia, unspecified: Secondary | ICD-10-CM | POA: Diagnosis not present

## 2021-07-02 DIAGNOSIS — K219 Gastro-esophageal reflux disease without esophagitis: Secondary | ICD-10-CM | POA: Diagnosis not present

## 2021-07-02 DIAGNOSIS — Z880 Allergy status to penicillin: Secondary | ICD-10-CM | POA: Diagnosis not present

## 2021-07-02 DIAGNOSIS — I130 Hypertensive heart and chronic kidney disease with heart failure and stage 1 through stage 4 chronic kidney disease, or unspecified chronic kidney disease: Secondary | ICD-10-CM | POA: Diagnosis not present

## 2021-07-02 DIAGNOSIS — I252 Old myocardial infarction: Secondary | ICD-10-CM | POA: Diagnosis not present

## 2021-07-02 DIAGNOSIS — Z940 Kidney transplant status: Secondary | ICD-10-CM | POA: Diagnosis not present

## 2021-07-02 DIAGNOSIS — N189 Chronic kidney disease, unspecified: Secondary | ICD-10-CM | POA: Diagnosis not present

## 2021-07-02 DIAGNOSIS — I509 Heart failure, unspecified: Secondary | ICD-10-CM | POA: Diagnosis not present

## 2021-07-02 MED ADMIN — lidocaine (XYLOCAINE) 10 mg/mL (1 %) injection: SUBCUTANEOUS | @ 14:00:00 | Stop: 2021-07-02

## 2021-07-02 MED ADMIN — midazolam (VERSED) injection: INTRAVENOUS | @ 14:00:00 | Stop: 2021-07-02

## 2021-07-02 MED ADMIN — fentaNYL (PF) (SUBLIMAZE) injection: INTRAVENOUS | @ 15:00:00 | Stop: 2021-07-02

## 2021-07-02 MED ADMIN — sodium chloride (NS) 0.9 % infusion: INTRAVENOUS | @ 13:00:00 | Stop: 2021-07-02

## 2021-07-02 MED ADMIN — bacitracin-polymyxin b (POLYSPORIN) ointment: TOPICAL | @ 15:00:00 | Stop: 2021-07-02

## 2021-07-02 MED ADMIN — fentaNYL (PF) (SUBLIMAZE) injection: INTRAVENOUS | @ 14:00:00 | Stop: 2021-07-02

## 2021-07-02 MED ADMIN — levoFLOXacin (LEVAQUIN) 500 mg/100 mL IVPB 500 mg: 500 mg | INTRAVENOUS | @ 13:00:00 | Stop: 2021-07-02

## 2021-07-02 MED ADMIN — midazolam (VERSED) injection: INTRAVENOUS | @ 15:00:00 | Stop: 2021-07-02

## 2021-07-02 MED ADMIN — midazolam (VERSED) injection: INTRAVENOUS | @ 13:00:00 | Stop: 2021-07-02

## 2021-07-02 NOTE — Unmapped (Signed)
VIR Post-Procedure Note    Procedure Name: CT CRYOABLATION RENAL PERCUTANEOUS RIGHT [[id number]]    Pre-Op Diagnosis: Renal mass    Post-Op Diagnosis: Same as pre-operative diagnosis    VIR Providers    Attending: Dr. Orlando Penner  Assistant: Dr. Jonell Cluck    Description of procedure: Percutaneous cryoablation of a right lower quadrant transplant renal mass    Sedation: Moderate - please see procedure nursing flow sheet for more information  Estimated Blood Loss: approximately 2 mL   Specimens: None   Contrast: None  Complications: None    Findings:   Successful cryoablation of the heterogeneous mass arising from the upper pole cortex of the RLQ transplant kidney.       See detailed procedure note with images in PACS [facility]).    The patient tolerated the procedure well without incident or complication and left the room in stable condition.     Jonell Cluck, MD  PGY-5, Diagnostic and Interventional Radiology  07/02/2021 11:18 AM

## 2021-07-02 NOTE — Unmapped (Signed)
[place] INTERVENTIONAL RADIOLOGY - Pre Procedure H/P      Assessment/Plan:    Mr. Miguel Ward is a 70 y.o. male who will undergo cryoablation in Interventional Radiology.    --This procedure has been fully reviewed with the patient/patient???s authorized representative. The risks, benefits and alternatives have been explained, and the patient/patient???s authorized representative has consented to the procedure.  --The patient will accept blood products in an emergent situation.  --The patient does not have a Do Not Resuscitate order in effect.      HPI: Mr. Miguel Ward is a 70 y.o. 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  Pt presenting for renal mass cryoablation w/ VIR.      PMH:   Past Medical History:   Diagnosis Date   ??? Anemia    ??? CHF (congestive heart failure) (CMS-HCC)    ??? Coronary artery disease    ??? Disease of thyroid gland    ??? GERD (gastroesophageal reflux disease)    ??? Gout    ??? Hyperlipidemia    ??? Hypertension    ??? Lupus nephritis (CMS-HCC)    ??? NSTEMI (non-ST elevated myocardial infarction) (CMS-HCC)    ??? Red blood cell antibody positive     anti-K   ??? Renal cancer, right (CMS-HCC)    ??? Renal transplant, status post        PSH:   Past Surgical History:   Procedure Laterality Date   ??? AV FISTULA PLACEMENT Right    ??? HERNIA REPAIR     ??? PR BREATH HYDROGEN TEST N/A 06/24/2016    Procedure: BREATH HYDROGEN TEST;  Surgeon: Nurse-Based Giproc;  [facility];  Service: Gastroenterology   ??? PR COLONOSCOPY FLX DX W/COLLJ SPEC WHEN PFRMD N/A 05/22/2016    Procedure: COLONOSCOPY, FLEXIBLE, PROXIMAL TO SPLENIC FLEXURE; DIAGNOSTIC, W/WO COLLECTION SPECIMEN BY BRUSH OR WASH;  Surgeon: Liane Comber, MD;  Location: [facility]ED[facility];  Service: Gastroenterology   ??? PR COLSC FLX W/RMVL OF TUMOR POLYP LESION SNARE TQ N/A 02/04/2021    Procedure: COLONOSCOPY FLEX; W/REMOV TUMOR/LES BY SNARE;  Surgeon: Rona Ravens, MD;  Location: GI PROCEDURES [facility];  Service: Gastroenterology   ??? PR LIGATN ANGIOACCESS AV FISTULA Right 10/21/2018    Procedure: LIGATION OR BANDING OF ANGIOACCESS ARTERIOVENOUS FISTULA, UPPER EXTREMITY;  Surgeon: Leona Carry, MD;  Location: MAIN OR [facility];  Service: Transplant   ??? PR NEPHRECTOMY Right 02/20/2014    Procedure: LAPAROSCOPY, SURGICAL; NEPHRECTOMY, INCLUDING PARTIAL URETERECTOMY;  Surgeon: Vivi Barrack, MD;  Location: MAIN [facility];  Service: Transplant   ??? PR REVISE AV FISTULA,W/O THROMBECTOMY Right 02/07/2019    Procedure: REVISON, OPEN, ARTERIOVENOUS FISTULA; WITHOUT THROMBECTOMY, AUTOGENOUS OR NONAUTOGENOUS DIALYSIS GRAFT;  Surgeon: Leona Carry, MD;  Location: MAIN OR [facility];  Service: Transplant   ??? TRANSPLANTATION RENAL         Allergies:   Allergies   Allergen Reactions   ??? Penicillins Hives     Other reaction(s): HIVES       Medications:  No relevant medications, please see full medication list in Epic.    ASA Grade: ASA 3 - Patient with moderate systemic disease with functional limitations    PE:    Vitals:    07/02/21 0746   BP: 174/86   Pulse: 74   Resp: 16   Temp: 36.3 ??C (97.3 ??F)   SpO2:  99%     General: male in NAD.  Airway assessment: Class 1 - Can visualize soft palate, fauces, uvula, and tonsillar pillars  Lungs: Respirations nonlabored    Kristian Covey MD  07/02/2021 8:30 AM

## 2021-07-03 DIAGNOSIS — Z940 Kidney transplant status: Principal | ICD-10-CM

## 2021-07-03 NOTE — Unmapped (Signed)
Called to check in on patient after his procedure yesterday. States he is feeling OK, some blood in his urine.    Asked patient to get weekly labs and that Dr Carlene Coria would like to see him in clinic in a few weeks. Let him know the schedulers should be reaching out to him to set up his apt.    Denies any other needs and will repeat labs weekly until his apt

## 2021-07-03 NOTE — Unmapped (Signed)
Attempted calling the patient to check how he's doing a day after is VIR procedure, no answer.

## 2021-07-08 MED ORDER — LEVOTHYROXINE 25 MCG TABLET
ORAL_TABLET | 3 refills | 0 days
Start: 2021-07-08 — End: ?

## 2021-07-09 MED ORDER — LEVOTHYROXINE 25 MCG TABLET
ORAL_TABLET | 3 refills | 0 days | Status: CP
Start: 2021-07-09 — End: ?

## 2021-07-10 ENCOUNTER — Other Ambulatory Visit
Admission: RE | Admit: 2021-07-10 | Discharge: 2021-07-10 | Disposition: A | Discharge status: Home / Self Care | Payer: Medicare HMO | Attending: Nephrology | Admitting: Nephrology

## 2021-07-10 ENCOUNTER — Ambulatory Visit: Admit: 2021-07-10 | Discharge: 2021-07-11 | Payer: MEDICARE

## 2021-07-10 DIAGNOSIS — N390 Urinary tract infection, site not specified: Secondary | ICD-10-CM | POA: Diagnosis not present

## 2021-07-10 DIAGNOSIS — D631 Anemia in chronic kidney disease: Secondary | ICD-10-CM | POA: Insufficient documentation

## 2021-07-10 DIAGNOSIS — Z789 Other specified health status: Secondary | ICD-10-CM | POA: Diagnosis not present

## 2021-07-10 DIAGNOSIS — Z79899 Other long term (current) drug therapy: Secondary | ICD-10-CM | POA: Diagnosis not present

## 2021-07-10 DIAGNOSIS — Z09 Encounter for follow-up examination after completed treatment for conditions other than malignant neoplasm: Secondary | ICD-10-CM | POA: Insufficient documentation

## 2021-07-10 DIAGNOSIS — Z9483 Pancreas transplant status: Secondary | ICD-10-CM | POA: Diagnosis not present

## 2021-07-10 DIAGNOSIS — D899 Disorder involving the immune mechanism, unspecified: Secondary | ICD-10-CM | POA: Insufficient documentation

## 2021-07-10 DIAGNOSIS — Z114 Encounter for screening for human immunodeficiency virus [HIV]: Secondary | ICD-10-CM | POA: Insufficient documentation

## 2021-07-10 DIAGNOSIS — R69 Illness, unspecified: Secondary | ICD-10-CM | POA: Diagnosis not present

## 2021-07-10 DIAGNOSIS — E1129 Type 2 diabetes mellitus with other diabetic kidney complication: Secondary | ICD-10-CM | POA: Diagnosis not present

## 2021-07-10 DIAGNOSIS — N189 Chronic kidney disease, unspecified: Secondary | ICD-10-CM | POA: Diagnosis not present

## 2021-07-10 DIAGNOSIS — E559 Vitamin D deficiency, unspecified: Secondary | ICD-10-CM | POA: Insufficient documentation

## 2021-07-10 DIAGNOSIS — B259 Cytomegaloviral disease, unspecified: Secondary | ICD-10-CM | POA: Insufficient documentation

## 2021-07-10 DIAGNOSIS — Z940 Kidney transplant status: Secondary | ICD-10-CM | POA: Insufficient documentation

## 2021-07-10 LAB — CBC WITH DIFFERENTIAL/PLATELET
Abs Immature Granulocytes: 0.02 10*3/uL (ref 0.00–0.07)
Basophils Absolute: 0 10*3/uL (ref 0.0–0.1)
Basophils Relative: 0 %
Eosinophils Absolute: 0.1 10*3/uL (ref 0.0–0.5)
Eosinophils Relative: 2 %
HCT: 32 % — ABNORMAL LOW (ref 39.0–52.0)
Hemoglobin: 9.9 g/dL — ABNORMAL LOW (ref 13.0–17.0)
Immature Granulocytes: 0 %
Lymphocytes Relative: 17 %
Lymphs Abs: 0.9 10*3/uL (ref 0.7–4.0)
MCH: 27.3 pg (ref 26.0–34.0)
MCHC: 30.9 g/dL (ref 30.0–36.0)
MCV: 88.2 fL (ref 80.0–100.0)
Monocytes Absolute: 0.6 10*3/uL (ref 0.1–1.0)
Monocytes Relative: 12 %
Neutro Abs: 3.7 10*3/uL (ref 1.7–7.7)
Neutrophils Relative %: 69 %
Platelets: 79 10*3/uL — ABNORMAL LOW (ref 150–400)
RBC: 3.63 MIL/uL — ABNORMAL LOW (ref 4.22–5.81)
RDW: 15.5 % (ref 11.5–15.5)
WBC: 5.4 10*3/uL (ref 4.0–10.5)
nRBC: 0 % (ref 0.0–0.2)

## 2021-07-10 LAB — MAGNESIUM: Magnesium: 1.8 mg/dL (ref 1.7–2.4)

## 2021-07-10 LAB — BASIC METABOLIC PANEL
Anion gap: 9 (ref 5–15)
BUN: 90 mg/dL — ABNORMAL HIGH (ref 8–23)
CO2: 22 mmol/L (ref 22–32)
Calcium: 9.5 mg/dL (ref 8.9–10.3)
Chloride: 108 mmol/L (ref 98–111)
Creatinine, Ser: 3.08 mg/dL — ABNORMAL HIGH (ref 0.61–1.24)
GFR, Estimated: 21 mL/min — ABNORMAL LOW (ref >60)
Glucose, Bld: 97 mg/dL (ref 70–99)
Potassium: 4 mmol/L (ref 3.5–5.1)
Sodium: 139 mmol/L (ref 135–145)

## 2021-07-10 LAB — PHOSPHORUS: Phosphorus: 3.5 mg/dL (ref 2.5–4.6)

## 2021-07-10 NOTE — Unmapped (Signed)
lvm with appt dates and times.

## 2021-07-15 NOTE — Unmapped (Signed)
[facility] Clinical Assessment & Refill Coordination Note    Miguel Ward, DOB: [birth year]  Phone: [phone] (home)     All above HIPAA information was verified with patient.     Was a Nurse, learning disability used for this call? No    Specialty Medication(s):   Transplant: tacrolimus 1mg      Current Outpatient Medications   Medication Sig Dispense Refill   ??? acyclovir (ZOVIRAX) 400 MG tablet Take 1 tablet (400 mg total) by mouth daily. 90 tablet 3   ??? allopurinoL (ZYLOPRIM) 100 MG tablet Take 1 tablet (100 mg total) by mouth in the morning. 30 tablet 11   ??? aspirin 81 MG chewable tablet Chew 81 mg daily.     ??? atorvastatin (LIPITOR) 20 MG tablet TAKE 1 TABLET BY MOUTH NIGHTLY. 30 tablet 11   ??? calcitrioL (ROCALTROL) 0.25 MCG capsule TAKE 1 CAPSULE BY MOUTH EVERY MONDAY, WEDNESDAY, AND FRIDAY AT 4PM. 36 capsule 3   ??? colchicine (COLCRYS) 0.6 mg tablet Take 1 tablet (0.6 mg total) by mouth daily as needed for up to 1 dose. 30 tablet 1   ??? enalapril (VASOTEC) 5 MG tablet Take 1 tablet (5 mg total) by mouth Two (2) times a day. 180 tablet 3   ??? ferrous sulfate 325 (65 FE) MG tablet Take 1 tablet (325 mg total) by mouth daily.  0   ??? fluticasone (FLONASE) 50 mcg/actuation nasal spray 1 spray into each nostril daily as needed.     ??? furosemide (LASIX) 20 MG tablet Take 40 mg in the morning and 20 mg in the evening. 270 tablet 3   ??? levothyroxine (SYNTHROID) 200 MCG tablet Take 1 tab with two tab, total dose daily 90 tablet 3   ??? levothyroxine (SYNTHROID) 25 MCG tablet TAKE (2) TABLETS BY MOUTH EVERY DAY WITH1 TAB FOR A TOTAL DAILY DOSE OF 180 tablet 3   ??? magnesium oxide (MAG-OX) 400 mg (241.3 mg elemental magnesium) tablet Take 1 tablet (400 mg total) by mouth Two (2) times a day. 60 tablet 11   ??? metoprolol succinate (TOPROL-XL) 100 MG 24 hr tablet Take 1 tablet (100 mg total) by mouth daily. 90 tablet 3   ??? mycophenolate (CELLCEPT) 250 mg capsule Take 1 capsule (250 mg total) by mouth Two (2) times a day. 60 capsule 11   ??? omeprazole (PRILOSEC) 20 MG capsule Take 1 capsule (20 mg total) by mouth in the morning. 90 capsule 3   ??? predniSONE (DELTASONE) 5 MG tablet Take 1 tablet (5 mg total) by mouth daily. 90 tablet 3   ??? simethicone (GAS-X) 80 MG chewable tablet Chew 1 tablet (80 mg total) every six (6) hours as needed for flatulence. 30 tablet 1   ??? tacrolimus (PROGRAF) 1 MG capsule Take 3 capsules (3 mg total) by mouth every morning AND 2 capsules (2 mg total) nightly. 150 capsule 11     No current facility-administered medications for this visit.        Changes to medications: Per reports no changes at this time.    Allergies   Allergen Reactions   ??? Penicillins Hives     Other reaction(s): HIVES       Changes to allergies: No    SPECIALTY MEDICATION ADHERENCE     Tacrolimus 1 mg: 13 days of medicine on hand         Medication Adherence    Patient reported X missed doses in the  last month: 0  Specialty Medication: tacrolimus (PROGRAF) 1 MG capsule  Patient is on additional specialty medications: No  Adherence tools used: patient uses a pill box to manage medications, calendar          Specialty medication(s) dose(s) confirmed: Regimen is correct and unchanged.     Are there any concerns with adherence? No    Adherence counseling provided? Not needed    CLINICAL MANAGEMENT AND INTERVENTION      Clinical Benefit Assessment:    Do you feel the medicine is effective or helping your condition? Yes    Clinical Benefit counseling provided? Not needed    Adverse Effects Assessment:    Are you experiencing any side effects? No    Are you experiencing difficulty administering your medicine? No    Quality of Life Assessment:         How many days over the past month did your kidney transplant  keep you from your normal activities? For example, brushing your teeth or getting up in the morning. 0    Have you discussed this with your provider? Not needed    Acute Infection Status:    Acute infections noted within Epic:  No active infections  Patient reported infection: None    Therapy Appropriateness:    Is therapy appropriate? Yes, therapy is appropriate and should be continued    DISEASE/MEDICATION-SPECIFIC INFORMATION      N/A    PATIENT SPECIFIC NEEDS     - Does the patient have any physical, cognitive, or cultural barriers? No    - Is the patient high risk? Yes, patient is taking a REMS drug. Medication is dispensed in compliance with REMS program    - Does the patient require a Care Management Plan? No     - Does the patient require physician intervention or other additional services (i.e. nutrition, smoking cessation, social work)? No      SHIPPING     Specialty Medication(s) to be Shipped:   Transplant: tacrolimus 1mg     Other medication(s) to be shipped: No additional medications requested for fill at this time     Changes to insurance: No    Delivery Scheduled: Yes, Expected medication delivery date: 07/24/21.     Medication will be delivered via UPS to the confirmed prescription address in [facility].    The patient will receive a drug information handout for each medication shipped and additional FDA Medication Guides as required.  Verified that patient has previously received a Conservation officer, historic buildings and a Surveyor, mining.    The patient or caregiver noted above participated in the development of this care plan and knows that they can request review of or adjustments to the care plan at any time.      All of the patient's questions and concerns have been addressed.    [name]   [facility] Specialty Pharmacist

## 2021-07-21 DIAGNOSIS — Z940 Kidney transplant status: Principal | ICD-10-CM

## 2021-07-21 DIAGNOSIS — B998 Other infectious disease: Principal | ICD-10-CM

## 2021-07-21 DIAGNOSIS — D849 Immunodeficiency, unspecified: Principal | ICD-10-CM

## 2021-07-21 NOTE — Unmapped (Signed)
Addended by: Joyice Faster on: 07/21/2021 09:30 AM     Modules accepted: Orders

## 2021-07-22 DIAGNOSIS — R7989 Other specified abnormal findings of blood chemistry: Principal | ICD-10-CM

## 2021-07-22 DIAGNOSIS — Z940 Kidney transplant status: Principal | ICD-10-CM

## 2021-07-22 DIAGNOSIS — N186 End stage renal disease: Principal | ICD-10-CM

## 2021-07-23 MED FILL — TACROLIMUS 1 MG CAPSULE, IMMEDIATE-RELEASE: ORAL | 30 days supply | Qty: 150 | Fill #6

## 2021-07-25 ENCOUNTER — Other Ambulatory Visit
Admission: RE | Admit: 2021-07-25 | Discharge: 2021-07-25 | Disposition: A | Discharge status: Home / Self Care | Payer: Medicare HMO | Attending: Nephrology | Admitting: Nephrology

## 2021-07-25 ENCOUNTER — Other Ambulatory Visit: Admit: 2021-07-25 | Discharge: 2021-07-26 | Payer: MEDICARE

## 2021-07-25 DIAGNOSIS — D849 Immunodeficiency, unspecified: Principal | ICD-10-CM

## 2021-07-25 DIAGNOSIS — B998 Other infectious disease: Principal | ICD-10-CM

## 2021-07-25 DIAGNOSIS — Z940 Kidney transplant status: Principal | ICD-10-CM

## 2021-07-25 DIAGNOSIS — D631 Anemia in chronic kidney disease: Secondary | ICD-10-CM | POA: Insufficient documentation

## 2021-07-25 DIAGNOSIS — N390 Urinary tract infection, site not specified: Secondary | ICD-10-CM | POA: Insufficient documentation

## 2021-07-25 DIAGNOSIS — Z09 Encounter for follow-up examination after completed treatment for conditions other than malignant neoplasm: Secondary | ICD-10-CM | POA: Insufficient documentation

## 2021-07-25 DIAGNOSIS — T8610 Unspecified complication of kidney transplant: Secondary | ICD-10-CM | POA: Diagnosis not present

## 2021-07-25 DIAGNOSIS — B259 Cytomegaloviral disease, unspecified: Secondary | ICD-10-CM | POA: Diagnosis not present

## 2021-07-25 DIAGNOSIS — E1129 Type 2 diabetes mellitus with other diabetic kidney complication: Secondary | ICD-10-CM | POA: Insufficient documentation

## 2021-07-25 DIAGNOSIS — X58XXXA Exposure to other specified factors, initial encounter: Secondary | ICD-10-CM | POA: Diagnosis not present

## 2021-07-25 DIAGNOSIS — Z9483 Pancreas transplant status: Secondary | ICD-10-CM | POA: Diagnosis not present

## 2021-07-25 DIAGNOSIS — R69 Illness, unspecified: Secondary | ICD-10-CM | POA: Diagnosis not present

## 2021-07-25 DIAGNOSIS — D899 Disorder involving the immune mechanism, unspecified: Secondary | ICD-10-CM | POA: Insufficient documentation

## 2021-07-25 DIAGNOSIS — Z114 Encounter for screening for human immunodeficiency virus [HIV]: Secondary | ICD-10-CM | POA: Diagnosis not present

## 2021-07-25 DIAGNOSIS — Z789 Other specified health status: Secondary | ICD-10-CM | POA: Insufficient documentation

## 2021-07-25 DIAGNOSIS — Z79899 Other long term (current) drug therapy: Secondary | ICD-10-CM | POA: Insufficient documentation

## 2021-07-25 LAB — PROTEIN / CREATININE RATIO, URINE
Creatinine, Urine: 64 mg/dL
Protein Creatinine Ratio: 0.58 mg/mg{Cre} — ABNORMAL HIGH (ref 0.00–0.15)
Total Protein, Urine: 37 mg/dL

## 2021-07-25 LAB — CBC WITH DIFFERENTIAL/PLATELET
Abs Immature Granulocytes: 0.01 10*3/uL (ref 0.00–0.07)
Basophils Absolute: 0 10*3/uL (ref 0.0–0.1)
Basophils Relative: 0 %
Eosinophils Absolute: 0.1 10*3/uL (ref 0.0–0.5)
Eosinophils Relative: 3 %
HCT: 31.6 % — ABNORMAL LOW (ref 39.0–52.0)
Hemoglobin: 9.7 g/dL — ABNORMAL LOW (ref 13.0–17.0)
Immature Granulocytes: 0 %
Lymphocytes Relative: 21 %
Lymphs Abs: 1 10*3/uL (ref 0.7–4.0)
MCH: 26.9 pg (ref 26.0–34.0)
MCHC: 30.7 g/dL (ref 30.0–36.0)
MCV: 87.8 fL (ref 80.0–100.0)
Monocytes Absolute: 0.7 10*3/uL (ref 0.1–1.0)
Monocytes Relative: 14 %
Neutro Abs: 2.9 10*3/uL (ref 1.7–7.7)
Neutrophils Relative %: 62 %
Platelets: 78 10*3/uL — ABNORMAL LOW (ref 150–400)
RBC: 3.6 MIL/uL — ABNORMAL LOW (ref 4.22–5.81)
RDW: 15.7 % — ABNORMAL HIGH (ref 11.5–15.5)
WBC: 4.6 10*3/uL (ref 4.0–10.5)
nRBC: 0 % (ref 0.0–0.2)

## 2021-07-25 LAB — BASIC METABOLIC PANEL
ANION GAP: 10 (ref 5–15)
Anion gap: 10 (ref 5–15)
BLOOD UREA NITROGEN: 85 mg/dL — ABNORMAL HIGH (ref 8–23)
BUN: 85 mg/dL — ABNORMAL HIGH (ref 8–23)
CALCIUM: 9.5 mg/dL (ref 8.9–10.3)
CHLORIDE: 109 mmol/L (ref 98–111)
CO2: 20 mmol/L — ABNORMAL LOW (ref 22–32)
CO2: 20 mmol/L — ABNORMAL LOW (ref 22–32)
CREATININE: 3.51 mg/dL — ABNORMAL HIGH (ref 0.61–1.24)
Calcium: 9.5 mg/dL (ref 8.9–10.3)
Chloride: 109 mmol/L (ref 98–111)
Creatinine, Ser: 3.51 mg/dL — ABNORMAL HIGH (ref 0.61–1.24)
EGFR CKD-EPI AA MALE: 18 mL/min — ABNORMAL LOW
GFR, Estimated: 18 mL/min — ABNORMAL LOW (ref >60)
GLUCOSE RANDOM: 103 mg/dL — ABNORMAL HIGH (ref 70–99)
Glucose, Bld: 103 mg/dL — ABNORMAL HIGH (ref 70–99)
MAGNESIUM: 2.1 mg/dL (ref 1.7–2.4)
PHOSPHORUS: 3.2 mg/dL (ref 2.5–4.6)
POTASSIUM: 4.3 mmol/L (ref 3.5–5.1)
Potassium: 4.3 mmol/L (ref 3.5–5.1)
SODIUM: 139 mmol/L (ref 135–145)
Sodium: 139 mmol/L (ref 135–145)

## 2021-07-25 LAB — URINALYSIS, COMPLETE (UACMP) WITH MICROSCOPIC
Bilirubin Urine: NEGATIVE
Glucose, UA: NEGATIVE mg/dL
Ketones, ur: NEGATIVE mg/dL
Leukocytes,Ua: NEGATIVE
Nitrite: NEGATIVE
Protein, ur: 30 mg/dL — AB
RBC / HPF: >50 RBC/hpf (ref 0–5)
Specific Gravity, Urine: 1.01 (ref 1.005–1.030)
pH: 5 (ref 5.0–8.0)

## 2021-07-25 LAB — PHOSPHORUS: Phosphorus: 3.2 mg/dL (ref 2.5–4.6)

## 2021-07-25 LAB — MAGNESIUM: Magnesium: 2.1 mg/dL (ref 1.7–2.4)

## 2021-07-25 LAB — CBC W/ DIFFERENTIAL
BASOPHILS ABSOLUTE COUNT: 0 10*3/uL (ref 0.0–0.1)
BASOPHILS RELATIVE PERCENT: 0 %
EOSINOPHILS ABSOLUTE COUNT: 0.1 10*3/uL (ref 0.0–0.5)
EOSINOPHILS RELATIVE PERCENT: 3 %
HEMATOCRIT: 31.6 — ABNORMAL LOW (ref 39.0–52.0)
HEMOGLOBIN: 9.7 g/dL — ABNORMAL LOW (ref 13.0–17.0)
IMMATURE CELLS: 0 %
LYMPHOCYTES ABSOLUTE COUNT: 1 10*3/uL (ref 0.7–4.0)
LYMPHOCYTES RELATIVE PERCENT: 21 %
MEAN CORPUSCULAR HEMOGLOBIN CONC: 30.7 g/dL (ref 30.0–36.0)
MEAN CORPUSCULAR HEMOGLOBIN: 26.9 pg (ref 26.0–34.0)
MEAN CORPUSCULAR VOLUME: 87.8 fL (ref 80.0–100.0)
MONOCYTES ABSOLUTE COUNT: 0.7 10*3/uL (ref 0.1–1.0)
MONOCYTES RELATIVE PERCENT: 14 %
NEUTROPHILS RELATIVE PERCENT: 62 %
NUCLEATED RED BLOOD CELLS: 0 % (ref 0.0–0.2)
PLATELET COUNT: 78 10*3/uL — ABNORMAL LOW (ref 150–400)
RED BLOOD CELL COUNT: 3.6 MIL/uL — ABNORMAL LOW (ref 4.22–5.81)
RED CELL DISTRIBUTION WIDTH: 15.7 % — ABNORMAL HIGH (ref 11.5–15.5)
WHITE BLOOD CELL COUNT: 4.6 10*3/uL (ref 4.0–10.5)

## 2021-07-25 LAB — URINALYSIS
BILIRUBIN UA: NEGATIVE
GLUCOSE UA: NEGATIVE mg/dL
KETONES UA: NEGATIVE mg/dL
LEUKOCYTE ESTERASE UA: NEGATIVE
NITRITE UA: NEGATIVE
PH UA: 5 (ref 5.0–8.0)
PROTEIN UA: 30 — AB
RBC UA: >50
SPECIFIC GRAVITY UA: 1.01 (ref 1.005–1.030)

## 2021-07-27 LAB — URINE CULTURE: Culture: NO GROWTH

## 2021-07-28 NOTE — Unmapped (Signed)
Transplant Nephrology Clinic Visit      History of Present Illness    Patient is a 71 y.o. male who underwent deceased donor transplant on 2007-06-21 secondary to lupus nephritis. His post transplant course has been complicated by early cellular rejection treated with Thymoglobulin. At that time, he was enrolled in the JAK-3 inhibitor trial and was taken off study drug. Patient does not have evidence of donor specific antibodies.  No evidence of DSA antibodies as of 09/05/20. Most recent baseline creatinine was 2.6-2.8, had been around 2.2-2.6 immediately prior to that. Following ablation creatinine 3.51 on 07/25/21.    He was noted to have a large pericardial effusion of greater than 1 L in 08/2007. At that time his TSH was 258. This improved with pericardiocentesis and thyroid replacement and has not reaccumulated. In 2009, he had coronary artery disease which required stenting.     His creatinine was between 1.5 and 2.2, but has been more in the low to mid 2s since his right native nephrectomy on 02/20/14 for renal cell carcinoma.  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    He was admitted to [facility] from 9/15 through 08/31/2016 after presenting with fever and leukocytosis. During that evaluation he had a CT of the abdomen on 08/29/2016 that showed New indeterminate liver lesions concerning for metastatic disease versus post transplant lymphoproliferative disorder versus abscesses. He then underwent MRI on 9/18 which showed Two liver lesions with minimal enhancement. Differential includes post transplant lymphoproliferative disorder, sequelae of prior infection or hematoma, less likely acute abscess. Metastases unlikely given enhancement characteristics. EBV was negative. As an outpatient he then underwent a PET scan on 09/14/2016 which showed Enlarging right hepatic lobe lesion with intense FDG uptake and central area of photopenia, concerning for necrotic metastasis, less likely abscess. Stable right hepatic dome lesion with intense FDG uptake, concerning for additional metastasis. After consult with radiology, they felt the best approach was ultrasound guided biopsy which he had on 10/01/2016. Pathology revealed Fragments of liver tissue with parenchymal extinction, mixed inflammatory infiltrates, and reactive-appearing stromal changes (see comment), - Increased iron staining within adjacent intact hepatic parenchyma (grade 3 of 4), - Sinusoidal congestion. The differential diagnosis includes sequelae from an abscess (e.g., inflammatory pseudotumor) versus nonspecific inflammatory changes adjacent to an unsampled mass. Correlation with the radiologic findings and clinical follow up are suggested.    The patient had a repeat MRI on 02/01/17 to visualize lesions seen on MRI in September. His repeat MRI revealed interval moderate decrease in the size of previously detected liver lesions, which could represent the sequelae of resolving right hepatic lobe infection with interval decrease in size of large right hepatic lobe lesion with internal T1 hyperintensity likely reflecting proteinacious material and mild internal and perilesional enhancement. Interval decrease in size and conspicuity of small hepatic dome lesion is also noted and this could also represent a resolving abscess. They recommended a re-scan in approximately 3 months (04/2017). I reviewed the report and images with Dr. Rush Barer who agreed that the lesion continues to look like resolving infection.     Patient underwent renal biopsy on 12/30/2017 to evaluate proteinuria which had increased over the last year from 0.75 to 1.4. That biopsy revealed Severe arteriolar hyalinosis of the mixed hypertensive and calcineurin inhibitor toxic types; Moderate arterionephrosclerosis; Immune complex mediated glomerulopathy (Glomeruli show IgG dominant partially resolved deposits without evidence of  activity, i.e. no proliferative glomerular lesions. These changes can indicate a minor recurrence of the lupus nephritis not carrying any direct therapeutic and prognostic significance at the present time.); Tubular pigment deposits, consistent with lipofuscin.    He was admitted 3/1 through 02/14/18 with RSV infection, AKI and new diastolic and systolic heart failure. TTE showed moderate EF 40-45%, reduced from 52% reported in January 2015. Patient's home lasix was increased to 40 mg qd from 20 mg qd with improvement in symptoms. Creatinine peaked at 2.73, 2.69 on discharge. It was suspected that patient's RSV infection worsened his heart failure and caused AKI is due to reduced perfusion. Patient's enalapril was held on discharge.     He was admitted 11/4 through 10/21/18 after presenting with dyspnea secondary to acute on chronic systolic heart failure. Echo showed globally, mildly decreased EF of 45% and grade III diastolic dysfunction. Patient was diuresed with lasix and discharged home on 80 mg bid. He was also found to have 12 L/min flow on PVL fistula study and underwent fistula banding to prevent worsening cardiac function from high output heart failure. Creatinine was elevated at time of admission to 3 but downtrended with diuresis to 2.3-2.8. Tac dose was decreased to 3 mg qAM and 2 mg qPM. No other changes to immunosuppression. Enalapril held due to kidney dysfunction.     At surgery visit on 11/17/18 to discuss fistula ligation (as flow post banding remained at 10 L/min), EKG was ordered and patient noted to be in a-fib. Was able to see cardiology that afternoon. No episodes of a-fib while monitored in the hospital and no prior history. Patient asymptomatic and hemodynamically stable. Rate controlled on metoprolol 100 mg daily, not started on anticoagulation at that time.      He underwent banding of right upper AV fistula on 02/07/19. Tolerated procedure well. He was seen in the emergency department on 02/11/19, presenting with increased swelling and intermittent bleeding from AV fistula incision site. Vascular surgery and Dr. Norma Fredrickson were consulted who both agreed patient was stable for discharge home, so long as there was no hematoma or signs of infection.     He called his coordinator on 09/09/20 reporting blood in stool. He was seen in the ED at [facility], Hgb was 11.3 down from 12.04 days prior. Rectal exam with partial rectal prolapse (known) and a mild amount of blood. Felt to be appropriate for discharge, with plans to recheck Hgb in 1-2 days and urgent colonoscopy. Reported another bloody bowel movement 09/11/20, recommended to return to ED if it continued. On 09/12/20 was noted to have an elevated creatinine to 3.24, Hgb down to 8.9. For some reason those labs were not routed to anyone on the care team so were not seen in real time.     There is a phone note from GI on 09/18/20 stating that patient reported a cough since 09/05/20, nurse advised that the colonoscopy be cancelled and transferred him to scheduling to change appointment. His coordinator followed up 09/24/20 and patient denied further bleeding. He had gone to urgent care that day and diagnosed with bronchitis, given doxycycline and tussinex. Still had not repeated labs by 10/02/20, he was called by his coordinator and encouraged to go. Had labs 10/03/20 that did not populate into our system, creatinine improved at 2.85, Hgb improved but still low at 9.7 (had been 8.9 on 09/12/20, 12.0 on 02/07/20).    He was seen in urgent care at [facility] 10/28/20 complaining of persistent cough. Don't  see that labs or COVID test were done. Recommended symptomatic therapy. He called on 10/30/20 to cancel his colonoscopy 11/01/20 due to URI (we were unaware he was cancelling).    At his visit 01/08/21 he reported having a bad cough for several weeks. Productive only of clear mucus. He noted his sinuses had been draining. 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 He tried honey and lemon without improvement. He didn't think it was getting worse but it had not gotten better. He noted his throat was dry when he woke in the morning. No fever. No SOB. Weight had been stable, minimal lower extremity edema.    Interval history since last visit 01/08/2021    He underwent Evusheld injections on 01/14/21 and tolerated those well.    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 Recommended speech therapy for cough suppression therapy, respiratory retraining and voice.    He underwent colonoscopy 02/04/2021 that showed diverticulosis, 2 polyps were removed, no active bleeding. Recommended repeat in 5 years.    His renal US 01/08/21 showed The previously identified echogenic lesion within the superior pole/interpolar region measures up to 1.7 increased in size from the prior ultrasound dated 09/27/2019, possibly cortical neoplasm. Biopsy versus short interval follow-up is recommended. Discussed patient in multidisciplinary conference with transplant surgery on 01/16/21. They reviewed the images and recommended repeating the MRI, as it had been 3+ months, felt they would be able to see if the lesion was growing, also recommended doing it with gadolinium. His GFR was borderline for gadolinium but in this context I thought the benefits outweighed the risks for using gadolinium. That MRI was done 01/30/21 and showed Right lower quadrant transplant kidney demonstrates multiple stable renal cysts. Again noted is a 1.9 cm lesion within the transplant kidney that demonstrates nodular internal components with equivocal progressive enhancement. Differential considerations include postbiopsy changes. Attention on follow-up is recommended. Repeat renal transplant [place] 03/21/21 showed Interval mild increase in size of hyperechoic lesion seen at the upper pole of the transplant kidney. This lesion is indeterminate and better evaluated on the recent MRI. Was presented at tumor board 04/10/21 who recommended follow up with urology for potential surgical plan for suspicious lesion. He saw them 05/08/21 and options were presented including observation, ablation, and partial nephrectomy. Discussed again at tumor board and percutaneous cryoablation was recommended. He had a consult with [facility] 05/22/21 and underwent procedure 07/02/21. On 05/08/21 creatinine was 2.5, post procedure was 3.51 on 07/25/21.    He presents today for follow up.    He has completed speech therapy and notes that it did improve his voice and decreased the chronic cough. He has been able to preach.    His main complaint today is of fluid retention and feeling sluggish. He feels like it started on 06/16/21 when he went on a trip to [place] and forgot some of his medications - metoprolol, magnesium, prednisone and allopurinol. Was only out for 3 days, but since coming home from that trip has had lower extremity swelling, R > L.     Since the ablation procedure, he has had further fluid accumulation in his legs and abdomen. He self increased his Lasix to 40 mg BID about one week ago. He notes the edema is better in the morning after he elevates his legs overnight. Denies orthopnea/PND, notes SOB with exertion. Feels tired, like he's dragging.    He  denies N/V, has loose stools that are unchanged for him and controlled with Imodium. Denies blood in stool. Appetite good.    He specifically denies fever, myalgias, upper respiratory/GI symptoms, or change in taste/smell.    Last dose of Prograf: 11:30 pm     Review of Systems    Otherwise on review of systems patient denies fever or chills, chest pain, PND, orthopnea or edema. No N/V/abdominal pain. No dysuria, hematuria or difficulty voiding. All other systems are reviewed and are negative.    Medications    Current Outpatient Medications   Medication Sig Dispense Refill   ??? acyclovir (ZOVIRAX) 400 MG tablet Take 1 tablet (400 mg total) by mouth daily. 90 tablet 3   ??? allopurinoL (ZYLOPRIM) 100 MG tablet Take 1 tablet (100 mg total) by mouth in the morning. 30 tablet 11   ??? aspirin 81 MG chewable tablet Chew 81 mg daily.     ??? atorvastatin (LIPITOR) 20 MG tablet TAKE 1 TABLET BY MOUTH NIGHTLY. 30 tablet 11   ??? calcitrioL (ROCALTROL) 0.25 MCG capsule TAKE 1 CAPSULE BY MOUTH EVERY MONDAY, WEDNESDAY, AND FRIDAY AT 4PM. 36 capsule 3   ??? colchicine (COLCRYS) 0.6 mg tablet Take 1 tablet (0.6 mg total) by mouth daily as needed for up to 1 dose. 30 tablet 1   ??? enalapril (VASOTEC) 5 MG tablet Take 1 tablet (5 mg total) by mouth Two (2) times a day. 180 tablet 3   ??? ferrous sulfate 325 (65 FE) MG tablet Take 1 tablet (325 mg total) by mouth daily.  0   ??? fluticasone (FLONASE) 50 mcg/actuation nasal spray 1 spray into each nostril daily as needed.     ??? furosemide (LASIX) 20 MG tablet Take 40 mg in the morning and 20 mg in the evening. 270 tablet 3   ??? levothyroxine (SYNTHROID) 200 MCG tablet Take 1 tab with two tab, total dose daily 90 tablet 3   ??? levothyroxine (SYNTHROID) 25 MCG tablet TAKE (2) TABLETS BY MOUTH EVERY DAY WITH1 TAB FOR A TOTAL DAILY DOSE OF 180 tablet 3   ??? magnesium oxide (MAG-OX) 400 mg (241.3 mg elemental magnesium) tablet Take 1 tablet (400 mg total) by mouth Two (2) times a day. 60 tablet 11   ??? metoprolol succinate (TOPROL-XL) 100 MG 24 hr tablet Take 1 tablet (100 mg total) by mouth daily. 90 tablet 3   ??? mycophenolate (CELLCEPT) 250 mg capsule Take 1 capsule (250 mg total) by mouth Two (2) times a day. 60 capsule 11   ??? omeprazole (PRILOSEC) 20 MG capsule Take 1 capsule (20 mg total) by mouth in the morning. 90 capsule 3   ??? predniSONE (DELTASONE) 5 MG tablet Take 1 tablet (5 mg total) by mouth daily. 90 tablet 3   ??? simethicone (GAS-X) 80 MG chewable tablet Chew 1 tablet (80 mg total) every six (6) hours as needed for flatulence. 30 tablet 1   ??? tacrolimus (PROGRAF) 1 MG capsule Take 3 capsules (3 mg total) by mouth every morning AND 2 capsules (2 mg total) nightly. 150 capsule 11     No current facility-administered medications for this visit.       Physical Exam    BP 144/62 (BP Site: L Arm, BP Position: Sitting, BP Cuff Size: Small)  - Pulse 72  - Temp 36.2 ??C (97.2 ??F) (Temporal)  - Ht 182.9 cm (6')  - Wt 79.7 kg (175 lb 12.8 oz)  - BMI  23.84 kg/m??   General: Patient is a pleasant male in no apparent distress.  Eyes: Sclera anicteric.   ENT: Mask in place.  Neck: Supple without LAD/JVD/bruits. No lymphadenopathy appreciated   Lungs: Clear to auscultation bilaterally, no wheezes/rales/rhonchi.  Cardiovascular: Regular rate and rhythm without murmurs, rubs or gallops.  Abdomen: Soft, mild distension. Positive bowel sounds. No tenderness over the graft.  Extremities: 1-2+ edema bilaterally.  Skin: Without rash.  Neurological: Grossly nonfocal.  Psychiatric: Mood and affect appropriate.      Laboratory Results    Recent Results (from the past 170 hour(s))   Tacrolimus, Trough   [facility])    Collection Time: 07/30/21  9:19 AM   Result Value Ref Range    Tacrolimus, Trough 8.6 5.0 - 15.0 ng/mL   Magnesium Level    Collection Time: 07/30/21  9:19 AM   Result Value Ref Range    Magnesium 2.0 1.6 - 2.6 mg/dL   Phosphorus Level    Collection Time: 07/30/21  9:19 AM   Result Value Ref Range    Phosphorus 3.0 2.4 - 5.1 mg/dL   Basic Metabolic Panel    Collection Time: 07/30/21  9:19 AM   Result Value Ref Range    Sodium 138 135 - 145 mmol/L    Potassium 4.9 (H) 3.4 - 4.8 mmol/L    Chloride 107 98 - 107 mmol/L    CO2 22.0 20.0 - 31.0 mmol/L    Anion Gap 9 5 - 14 mmol/L    BUN 75 (H) 9 - 23 mg/dL    Creatinine 5.62 (H) 0.60 - 1.10 mg/dL    BUN/Creatinine Ratio 21     eGFR CKD-EPI (2021) Male 18 (L) >=60 mL/min/1.40m2    Glucose 98 70 - 99 mg/dL    Calcium 9.9 8.7 - 13.0 mg/dL   Urinalysis    Collection Time: 07/30/21  9:19 AM   Result Value Ref Range    Color, UA Yellow     Clarity, UA Cloudy     Specific Gravity, UA 1.015 1.005 - 1.030    pH, UA 5.5 5.0 - 9.0    Leukocyte Esterase, UA Negative Negative    Nitrite, UA Negative Negative    Protein, UA 30 mg/dL (A) Negative    Glucose, UA Negative Negative    Ketones, UA Negative Negative    Urobilinogen, UA 0.2 mg/dL 0.2 - 2.0 mg/dL    Bilirubin, UA Negative Negative    Blood, UA Large (A) Negative    RBC, UA >100 (H) <3 /HPF    WBC, UA <1 <2 /HPF    Squam Epithel, UA <1 0 - 5 /HPF    Bacteria, UA Many (A) None Seen /HPF   Culture, Urine    Collection Time: 07/30/21  9:19 AM    Specimen: Clean Catch; Urine   Result Value Ref Range    Urine Culture, Comprehensive NO SIGNIFICANT GROWTH: <1000 CFU/mL    Protein/Creatinine Ratio, Urine    Collection Time: 07/30/21  9:19 AM   Result Value Ref Range    Creat U 82.3 Undefined mg/dL    Protein, Ur 86.5 Undefined mg/dL    Protein/Creatinine Ratio, Urine 0.668 Undefined   Cytology - Urine    Collection Time: 07/30/21  9:19 AM   Result Value Ref Range    Diagnosis       A. Urine, voided  - No malignant cells identified  - No decoy cells identified  - Few squamous cells, numerous red blood  cells, and abundant acute inflammation    This electronic signature is attestation that the pathologist personally reviewed the submitted material(s) and the final diagnosis reflects that evaluation.      Clinical History       Urine for Decoy cells, s/p Kidney Transplant      Gross Description            Urine clean catch    38mL,clear amber colored fluid,unfixed    Materials Prepared & Examined    Smear Slides.......0  Monolayers..........1  Cytospins.............0  Cell Blocks...........0  Core Biopsy.........0  Touch Prep..........0      Microscopic Description       Microscopic examination substantiates the above diagnosis.    Resident Physician: None Assigned      EMBEDDED IMAGES      Specimen Adequacy Satisfactory for evaluation     Disclaimer       Unless otherwise specified, specimens are preserved using 10% neutral buffered formalin. For cases in which immunohistochemical and/or in-situ hybridization stains are performed, the following statement applies: Appropriate controls for each stain (positive controls with or without negative controls) have been evaluated and stain as expected. These stains have not been separately validated for use on decalcified specimens and should be interpreted with caution in that setting. Some of the reagents used for these stains may be classified as analyte specific reagents (ASR). Tests using ASRs were developed, and their performance characteristics were determined, by the Anatomic Pathology Department [facility]). They have not been cleared or approved by the [place] Food and Drug Administration (FDA). The FDA does not require these tests to go through premarket FDA review. These tests are used for clinical purposes. They should not be regarded as investigational or for research. This laboratory is certified under the Clinical Laboratory Improvement Amendments (CLIA) as qualified to perform high complexity clinical laboratory testing.     Lipid panel    Collection Time: 07/30/21  9:19 AM   Result Value Ref Range    Triglycerides 46 0 - 150 mg/dL    Cholesterol 82 <=161 mg/dL    HDL 36 (L) 40 - 60 mg/dL    LDL Calculated 37 (L) 40 - 99 mg/dL    VLDL Cholesterol Cal 9.2 (L) 12 - 42 mg/dL    Chol/HDL Ratio 2.3 1.0 - 4.5    Non-HDL Cholesterol 46 (L) 70 - 130 mg/dL    FASTING Yes    Parathyroid Hormone (PTH)    Collection Time: 07/30/21  9:19 AM   Result Value Ref Range    PTH 566.1 (H) 18.4 - 80.1 pg/mL   Hemoglobin A1c    Collection Time: 07/30/21  9:19 AM   Result Value Ref Range    Hemoglobin A1C 6.3 (H) 4.8 - 5.6 %    Estimated Average Glucose 134 mg/dL   Vitamin D 0,96 dihydroxy    Collection Time: 07/30/21  9:19 AM   Result Value Ref Range    Vit D, 1,25-Dihydroxy 25 18 - 64 pg/mL   Vitamin D 25 hydroxy Collection Time: 07/30/21  9:19 AM   Result Value Ref Range    Vitamin D Total (25OH) 14.7 (L) 20.0 - 80.0 ng/mL   HLA DSA Post Transplant    Collection Time: 07/30/21  9:19 AM   Result Value Ref Range    Donor ID [id number]     Donor HLA-A Antigen #1 A1     Anti-Donor HLA-A #1 MFI 0 <1000 MFI    Donor  HLA-A Antigen #2 A30     Anti-Donor HLA-A #2 MFI 0 <1000 MFI    Donor HLA-B Antigen #1 B35     Anti-Donor HLA-B #1 MFI 0 <1000 MFI    Donor HLA-B Antigen #2 B49     Anti-Donor HLA-B #2 MFI 0 <1000 MFI    Donor HLA-DR Antigen #1 DR15     Anti-Donor HLA-DR #1 MFI 0 <1000 MFI    Donor HLA-DR Antigen #2 DR11     Anti-Donor HLA-DR #2 MFI 0 <1000 MFI    Donor DRw Antigen #1 DR52     Anti-Donor DRw #1 MFI 0 <1000 MFI    Donor HLA-DQB Antigen #1 DQ6     Anti-Donor HLA-DQB #1 MFI 0 <1000 MFI    Donor HLA-DQB Antigen #2 DQ7     Anti-Donor HLA-DQB #2 MFI 0 <1000 MFI    DSA Comment     FSAB CL2 SPECIFICITY    Collection Time: 07/30/21  9:19 AM   Result Value Ref Range    HLA Class 2 Antibody Result Negative     HLA Class 2 Antibody Comment     CBC w/ Differential    Collection Time: 07/30/21  9:19 AM   Result Value Ref Range    WBC 4.8 3.6 - 11.2 10*9/L    RBC 3.71 (L) 4.26 - 5.60 10*12/L    HGB 10.1 (L) 12.9 - 16.5 g/dL    HCT 78.4 (L) 69.6 - 48.0 %    MCV 85.9 77.6 - 95.7 fL    MCH 27.3 25.9 - 32.4 pg    MCHC 31.8 (L) 32.0 - 36.0 g/dL    RDW 29.5 (H) 28.4 - 15.2 %    MPV 10.1 6.8 - 10.7 fL    Platelet 83 (L) 150 - 450 10*9/L    Neutrophils % 68.1 %    Lymphocytes % 17.7 %    Monocytes % 11.8 %    Eosinophils % 1.8 %    Basophils % 0.6 %    Absolute Neutrophils 3.2 1.8 - 7.8 10*9/L    Absolute Lymphocytes 0.8 (L) 1.1 - 3.6 10*9/L    Absolute Monocytes 0.6 0.3 - 0.8 10*9/L    Absolute Eosinophils 0.1 0.0 - 0.5 10*9/L    Absolute Basophils 0.0 0.0 - 0.1 10*9/L   FSAB Class 1 Antibody Specificity    Collection Time: 07/30/21  9:19 AM   Result Value Ref Range    HLA Class 1 Antibody Result Negative     HLA Class 1 Antibody Comment Assessment and Plan    1. Status post renal transplant. Creatinine rose to 3.51 following the ablation, is stable at 3.54 today. His Prograf trough of 8.6 is a 10 hour trough and at goal of 6-8. He remains on a reduced dose of CellCept 250mg  BID with history of diarrhea and malignancy. Will continue current immunosuppression.    2. Lesion in transplanted kidney suspicious for RCC, s/p cryoablation 07/02/21. Has follow up with [facility] in 3 months with repeat MRI. Of note, his GFR is NOT adequate for gadolinium so he should NOT receive contrast with his MRI.    3. Fluid overload and HFrEF. He underwent  fistula banding in February 2020 with improvement in fluid overload at that time. Since his last visit, has been retaining fluid in spite of a self increase in Lasix dose from 40/20 to 40 mg BID. Probably needs a higher dose given his increased creatinine, but don't feel comfortable going up  right now given elevated BUN. Encourage to continue to elevate legs when possible and wear compression stockings. Will order repeat echo as it has not been done since 08/2019.     4. Anemia, likely due to stage 4 CKD. His colonoscopy was unrevealing for source of bleeding. Had adequate iron stores 01/08/21, but will repeat those with next labs to make sure he doesn't require repletion. If iron normal, will likely need some Aranesp. Anemia likely contributing to fatigue symptoms.    5. Rectal bleeding September 2021. Colonoscopy 02/04/21 was reassuring, no further blood per rectum. Due for repeat colonoscopy in 01/2026.     6. Chronic cough/decreased voice. Better with speech therapy, appreciate ENT input.    7. Right renal cell carcinoma status post nephrectomy. Imaging previously showed hepatic mass that was biopsied and consistent with inflammatory process. MRI from 10/08/20 and 01/30/21 remains reassuring, with unchanged size and overall appearance of hepatic lesion. Surgery did not recommend any additional diagnostic workup and will continue to monitor annually with imaging, due February 2023 .    8. Hypertension. Blood pressure in clinic is 144/62, he consistently runs higher in the office. Home blood pressures at goal. Will continue current regimen.    9. Atrial fibrillation. Rate controlled on current dose of metoprolol. He is not currently on anticoagulation given history of thrombocytopenia. Cardiology is managing.      10. Hypothyroidism. On Synthroid at 250 mcg daily. Will repeat thyroid studies with next labs.    11. Hypomagnesemia. Magnesium normal today at 2.0 on 400 mg BID.     12. Proteinuria.  UP/C peaked at 1.5 on 05/20/2018 -> 0.355 on 09/08/19 -> 0.57 (01/05/20) -> 0.137 (05/02/20) -> undetectable (09/05/20) -> 0.879 (01/08/21) -> 0.668 today. Continue enalapril.    13. Health maintenance. He received Moderna COVID vaccines on 01/18/20, 02/08/20 and 09/05/20. Declined fourth dose today in clinic, would prefer to delay to see if there is an updated booster this fall. Got Evusheld 01/14/21. He received a flu shot in clinic 09/05/20. Has completed Shingrix and pneumococcal series.     We discussed that while we continue to recommend the COVID vaccine for all eligible patients, we know that kidney transplant patients do not respond as strongly due to their immunosuppression, so he should continue to follow the CDC guidelines for patients who are not vaccinated. I would also encourage all household members and close contacts that are eligible for vaccination to get vaccinated. We discussed the importance of ongoing social distancing, wearing a mask outside the home, and hand washing/sanitizing.     14. Will see patient back at [facility] on 09/25/21 (to coincide with VIR visit), or sooner if needed.

## 2021-07-29 LAB — TACROLIMUS LEVEL, TROUGH: TACROLIMUS, TROUGH: 6.6 ng/mL (ref 5.0–15.0)

## 2021-07-30 ENCOUNTER — Ambulatory Visit: Admit: 2021-07-30 | Discharge: 2021-07-30 | Payer: MEDICARE

## 2021-07-30 ENCOUNTER — Ambulatory Visit: Admit: 2021-07-30 | Discharge: 2021-07-30 | Payer: MEDICARE | Attending: Nephrology | Primary: Nephrology

## 2021-07-30 DIAGNOSIS — Z940 Kidney transplant status: Principal | ICD-10-CM

## 2021-07-30 DIAGNOSIS — D849 Immunodeficiency, unspecified: Principal | ICD-10-CM

## 2021-07-30 DIAGNOSIS — D631 Anemia in chronic kidney disease: Principal | ICD-10-CM

## 2021-07-30 DIAGNOSIS — I5023 Acute on chronic systolic (congestive) heart failure: Principal | ICD-10-CM

## 2021-07-30 DIAGNOSIS — I5020 Unspecified systolic (congestive) heart failure: Principal | ICD-10-CM

## 2021-07-30 DIAGNOSIS — R7989 Other specified abnormal findings of blood chemistry: Principal | ICD-10-CM

## 2021-07-30 DIAGNOSIS — N186 End stage renal disease: Principal | ICD-10-CM

## 2021-07-30 DIAGNOSIS — B998 Other infectious disease: Principal | ICD-10-CM

## 2021-07-30 DIAGNOSIS — I5032 Chronic diastolic (congestive) heart failure: Principal | ICD-10-CM

## 2021-07-30 DIAGNOSIS — N184 Chronic kidney disease, stage 4 (severe): Principal | ICD-10-CM

## 2021-07-30 DIAGNOSIS — E039 Hypothyroidism, unspecified: Secondary | ICD-10-CM | POA: Diagnosis not present

## 2021-07-30 DIAGNOSIS — R809 Proteinuria, unspecified: Secondary | ICD-10-CM | POA: Diagnosis not present

## 2021-07-30 DIAGNOSIS — I10 Essential (primary) hypertension: Secondary | ICD-10-CM | POA: Diagnosis not present

## 2021-07-30 DIAGNOSIS — R059 Cough, unspecified: Secondary | ICD-10-CM | POA: Diagnosis not present

## 2021-07-30 DIAGNOSIS — Z23 Encounter for immunization: Secondary | ICD-10-CM | POA: Diagnosis not present

## 2021-07-30 LAB — TACROLIMUS LEVEL, TROUGH: TACROLIMUS, TROUGH: 8.6 ng/mL (ref 5.0–15.0)

## 2021-07-30 LAB — URINALYSIS
BILIRUBIN UA: NEGATIVE
GLUCOSE UA: NEGATIVE
KETONES UA: NEGATIVE
LEUKOCYTE ESTERASE UA: NEGATIVE
NITRITE UA: NEGATIVE
PH UA: 5.5 (ref 5.0–9.0)
PROTEIN UA: 30 — AB
RBC UA: >100 /HPF — ABNORMAL HIGH (ref <3)
SPECIFIC GRAVITY UA: 1.015 (ref 1.005–1.030)
SQUAMOUS EPITHELIAL: <1 /HPF (ref 0–5)
UROBILINOGEN UA: 0.2
WBC UA: <1 /HPF (ref <2)

## 2021-07-30 LAB — LIPID PANEL
CHOLESTEROL/HDL RATIO SCREEN: 2.3 (ref 1.0–4.5)
CHOLESTEROL: 82 mg/dL (ref <=200)
HDL CHOLESTEROL: 36 mg/dL — ABNORMAL LOW (ref 40–60)
LDL CHOLESTEROL CALCULATED: 37 mg/dL — ABNORMAL LOW (ref 40–99)
NON-HDL CHOLESTEROL: 46 mg/dL — ABNORMAL LOW (ref 70–130)
TRIGLYCERIDES: 46 mg/dL (ref 0–150)
VLDL CHOLESTEROL CAL: 9.2 mg/dL — ABNORMAL LOW (ref 12–42)

## 2021-07-30 LAB — CBC W/ AUTO DIFF
BASOPHILS ABSOLUTE COUNT: 0 10*9/L (ref 0.0–0.1)
BASOPHILS RELATIVE PERCENT: 0.6 %
EOSINOPHILS ABSOLUTE COUNT: 0.1 10*9/L (ref 0.0–0.5)
EOSINOPHILS RELATIVE PERCENT: 1.8 %
HEMATOCRIT: 31.9 % — ABNORMAL LOW (ref 39.0–48.0)
HEMOGLOBIN: 10.1 g/dL — ABNORMAL LOW (ref 12.9–16.5)
LYMPHOCYTES ABSOLUTE COUNT: 0.8 10*9/L — ABNORMAL LOW (ref 1.1–3.6)
LYMPHOCYTES RELATIVE PERCENT: 17.7 %
MEAN CORPUSCULAR HEMOGLOBIN CONC: 31.8 g/dL — ABNORMAL LOW (ref 32.0–36.0)
MEAN CORPUSCULAR HEMOGLOBIN: 27.3 pg (ref 25.9–32.4)
MEAN CORPUSCULAR VOLUME: 85.9 fL (ref 77.6–95.7)
MEAN PLATELET VOLUME: 10.1 fL (ref 6.8–10.7)
MONOCYTES ABSOLUTE COUNT: 0.6 10*9/L (ref 0.3–0.8)
MONOCYTES RELATIVE PERCENT: 11.8 %
NEUTROPHILS ABSOLUTE COUNT: 3.2 10*9/L (ref 1.8–7.8)
NEUTROPHILS RELATIVE PERCENT: 68.1 %
PLATELET COUNT: 83 10*9/L — ABNORMAL LOW (ref 150–450)
RED BLOOD CELL COUNT: 3.71 10*12/L — ABNORMAL LOW (ref 4.26–5.60)
RED CELL DISTRIBUTION WIDTH: 16.2 % — ABNORMAL HIGH (ref 12.2–15.2)
WBC ADJUSTED: 4.8 10*9/L (ref 3.6–11.2)

## 2021-07-30 LAB — BASIC METABOLIC PANEL
ANION GAP: 9 mmol/L (ref 5–14)
BLOOD UREA NITROGEN: 75 mg/dL — ABNORMAL HIGH (ref 9–23)
BUN / CREAT RATIO: 21
CALCIUM: 9.9 mg/dL (ref 8.7–10.4)
CHLORIDE: 107 mmol/L (ref 98–107)
CO2: 22 mmol/L (ref 20.0–31.0)
CREATININE: 3.54 mg/dL — ABNORMAL HIGH
EGFR CKD-EPI (2021) MALE: 18 mL/min/{1.73_m2} — ABNORMAL LOW (ref >=60)
GLUCOSE RANDOM: 98 mg/dL (ref 70–99)
POTASSIUM: 4.9 mmol/L — ABNORMAL HIGH (ref 3.4–4.8)
SODIUM: 138 mmol/L (ref 135–145)

## 2021-07-30 LAB — PHOSPHORUS: PHOSPHORUS: 3 mg/dL (ref 2.4–5.1)

## 2021-07-30 LAB — HEMOGLOBIN A1C
ESTIMATED AVERAGE GLUCOSE: 134 mg/dL
HEMOGLOBIN A1C: 6.3 % — ABNORMAL HIGH (ref 4.8–5.6)

## 2021-07-30 LAB — PROTEIN / CREATININE RATIO, URINE
CREATININE, URINE: 82.3 mg/dL
PROTEIN URINE: 55 mg/dL
PROTEIN/CREAT RATIO, URINE: 0.668

## 2021-07-30 LAB — MAGNESIUM: MAGNESIUM: 2 mg/dL (ref 1.6–2.6)

## 2021-07-30 LAB — VITAMIN D 25 HYDROXY: VITAMIN D, TOTAL (25OH): 14.7 ng/mL — ABNORMAL LOW (ref 20.0–80.0)

## 2021-07-30 LAB — PARATHYROID HORMONE (PTH): PARATHYROID HORMONE INTACT: 566.1 pg/mL — ABNORMAL HIGH (ref 18.4–80.1)

## 2021-07-30 MED ADMIN — tixagevimab-cilgavimab 300 mg/3 mL- 300 mg/3 mL injection 6 mL: 6 mL | INTRAMUSCULAR | @ 13:00:00 | Stop: 2021-07-30

## 2021-07-30 NOTE — Unmapped (Signed)
Pt here for Evusheld injections. Education and handout provided to pt and questions answered. Signs and symptoms gone over with patient and they verbalized understanding. First injection given to right side gluteal muscle and second given to left side. Pt tolerated without difficulty. Pt in lobby for one hour observation.

## 2021-07-30 NOTE — Unmapped (Signed)
[id number]

## 2021-08-05 LAB — HLA DS POST TRANSPLANT
ANTI-DONOR DRW #1 MFI: 0 MFI
ANTI-DONOR HLA-A #1 MFI: 0 MFI
ANTI-DONOR HLA-A #2 MFI: 0 MFI
ANTI-DONOR HLA-B #1 MFI: 0 MFI
ANTI-DONOR HLA-B #2 MFI: 0 MFI
ANTI-DONOR HLA-DQB #1 MFI: 0 MFI
ANTI-DONOR HLA-DQB #2 MFI: 0 MFI
ANTI-DONOR HLA-DR #1 MFI: 0 MFI
ANTI-DONOR HLA-DR #2 MFI: 0 MFI

## 2021-08-05 LAB — FSAB CLASS 2 ANTIBODY SPECIFICITY: HLA CL2 AB RESULT: NEGATIVE

## 2021-08-05 LAB — FSAB CLASS 1 ANTIBODY SPECIFICITY: HLA CLASS 1 ANTIBODY RESULT: NEGATIVE

## 2021-08-05 LAB — VITAMIN D 1,25 DIHYDROXY: VITAMIN D 1,25-DIHYDROXY: 25 pg/mL

## 2021-08-08 DIAGNOSIS — Z940 Kidney transplant status: Principal | ICD-10-CM

## 2021-08-08 DIAGNOSIS — E039 Hypothyroidism, unspecified: Principal | ICD-10-CM

## 2021-08-08 MED ORDER — MYCOPHENOLATE MOFETIL 250 MG CAPSULE
ORAL_CAPSULE | Freq: Two times a day (BID) | ORAL | 11 refills | 30 days | Status: CP
Start: 2021-08-08 — End: 2022-08-08
  Filled 2021-08-15: qty 60, 30d supply, fill #0

## 2021-08-08 NOTE — Unmapped (Signed)
8/26: pt states that cellcept mfg told him they could re-enroll him in their program from free med instead of paying the [facility]7.73/[facility]ds [facility]. Reaching out to triage to see about a referral per patient request. Due to quantity on hand, pt would like a call back next week for status update Miguel Ward    [facility] Clinical Assessment & Refill Coordination Note    Miguel Ward, DOB: 1951/06/24  Phone: [phone] (home)     All above HIPAA information was verified with patient.     Was a Nurse, learning disability used for this call? No    Specialty Medication(s):   Transplant: Cellcept 250mg , mycophenolate mofetil 250mg  and tacrolimus 1mg      Current Outpatient Medications   Medication Sig Dispense Refill   ??? acyclovir (ZOVIRAX) 400 MG tablet Take 1 tablet (400 mg total) by mouth daily. 90 tablet 3   ??? allopurinoL (ZYLOPRIM) 100 MG tablet Take 1 tablet (100 mg total) by mouth in the morning. 30 tablet 11   ??? aspirin 81 MG chewable tablet Chew 81 mg daily.     ??? atorvastatin (LIPITOR) 20 MG tablet TAKE 1 TABLET BY MOUTH NIGHTLY. 30 tablet 11   ??? calcitrioL (ROCALTROL) 0.25 MCG capsule TAKE 1 CAPSULE BY MOUTH EVERY MONDAY, WEDNESDAY, AND FRIDAY AT 4PM. 36 capsule 3   ??? colchicine (COLCRYS) 0.6 mg tablet Take 1 tablet (0.6 mg total) by mouth daily as needed for up to 1 dose. 30 tablet 1   ??? enalapril (VASOTEC) 5 MG tablet Take 1 tablet (5 mg total) by mouth Two (2) times a day. 180 tablet 3   ??? ferrous sulfate 325 (65 FE) MG tablet Take 1 tablet (325 mg total) by mouth daily.  0   ??? fluticasone (FLONASE) 50 mcg/actuation nasal spray 1 spray into each nostril daily as needed.     ??? furosemide (LASIX) 20 MG tablet Take 40 mg in the morning and 20 mg in the evening. 270 tablet 3   ??? levothyroxine (SYNTHROID) 200 MCG tablet Take 1 tab with two tab, total dose daily 90 tablet 3   ??? levothyroxine (SYNTHROID) 25 MCG tablet TAKE (2) TABLETS BY MOUTH EVERY DAY WITH1 TAB FOR A TOTAL DAILY DOSE OF 180 tablet 3   ??? magnesium oxide (MAG-OX) 400 mg (241.3 mg elemental magnesium) tablet Take 1 tablet (400 mg total) by mouth Two (2) times a day. 60 tablet 11   ??? metoprolol succinate (TOPROL-XL) 100 MG 24 hr tablet Take 1 tablet (100 mg total) by mouth daily. 90 tablet 3   ??? mycophenolate (CELLCEPT) 250 mg capsule Take 1 capsule (250 mg total) by mouth Two (2) times a day. 60 capsule 11   ??? omeprazole (PRILOSEC) 20 MG capsule Take 1 capsule (20 mg total) by mouth in the morning. 90 capsule 3   ??? predniSONE (DELTASONE) 5 MG tablet Take 1 tablet (5 mg total) by mouth daily. 90 tablet 3   ??? simethicone (GAS-X) 80 MG chewable tablet Chew 1 tablet (80 mg total) every six (6) hours as needed for flatulence. 30 tablet 1   ??? tacrolimus (PROGRAF) 1 MG capsule Take 3 capsules (3 mg total) by mouth every morning AND 2 capsules (2 mg total) nightly. 150 capsule 11     No current facility-administered medications for this visit.        Changes to medications: Miguel Ward reports no changes at this time.    Allergies  Allergen Reactions   ??? Penicillins Hives     Other reaction(s): HIVES       Changes to allergies: No    SPECIALTY MEDICATION ADHERENCE     cellcept 250mg   : 10 days of medicine on hand from mfg shipment  Mycophenolate 250mg   : 0 days of medicine on hand   Tacrolimus 1mg   : 15 days of medicine on hand    Medication Adherence    Patient reported X missed doses in the last month: 0  Specialty Medication: tacrolimus 1mg   Patient is on additional specialty medications: Yes  Additional Specialty Medications: Mycophenolate 250mg   Patient Reported Additional Medication X Missed Doses in the Last Month: 0  Patient is on more than two specialty medications: Yes  Specialty Medication: cellcept 250mg   Patient Reported Additional Medication X Missed Doses in the Last Month: 0  Adherence tools used: patient uses a pill box to manage medications, calendar          Specialty medication(s) dose(s) confirmed: Regimen is correct and unchanged.     Are there any concerns with adherence? No    Adherence counseling provided? Not needed    CLINICAL MANAGEMENT AND INTERVENTION      Clinical Benefit Assessment:    Do you feel the medicine is effective or helping your condition? Yes    Clinical Benefit counseling provided? Not needed    Adverse Effects Assessment:    Are you experiencing any side effects? No    Are you experiencing difficulty administering your medicine? No    Quality of Life Assessment:         How many days over the past month did your transplant  keep you from your normal activities? For example, brushing your teeth or getting up in the morning. 0    Have you discussed this with your provider? Not needed    Acute Infection Status:    Acute infections noted within Epic:  No active infections  Patient reported infection: None    Therapy Appropriateness:    Is therapy appropriate? Yes, therapy is appropriate and should be continued    DISEASE/MEDICATION-SPECIFIC INFORMATION      N/A    PATIENT SPECIFIC NEEDS     - Does the patient have any physical, cognitive, or cultural barriers? No    - Is the patient high risk? Yes, patient is taking a REMS drug. Medication is dispensed in compliance with REMS program    - Does the patient require a Care Management Plan? No     - Does the patient require physician intervention or other additional services (i.e. nutrition, smoking cessation, social work)? No      SHIPPING     Specialty Medication(s) to be Shipped:   na    Other medication(s) to be shipped: No additional medications requested for fill at this time     Changes to insurance: No    Delivery Scheduled: Patient declined refill at this time due to wants to see if [employer] assistance will be renewed. call set up for next week per patient request.     Medication will be delivered via na to the confirmed na address in [facility]    The patient will receive a drug information handout for each medication shipped and additional FDA Medication Guides as required.  Verified that patient has previously received a Conservation officer, historic buildings and a Surveyor, mining.    The patient or caregiver noted above participated in the development of this  care plan and knows that they can request review of or adjustments to the care plan at any time.      All of the patient's questions and concerns have been addressed.    [name]   [facility] Specialty Pharmacist

## 2021-08-08 NOTE — Unmapped (Signed)
Called patient to ask him to repeat labs next week, wear compression socks, and prop his feet when he can.    He verbalized understanding. States he has no swelling in the AM.    Also states she needs to fill out his MAP for cellcept. [facility]    Denies any other needs

## 2021-08-08 NOTE — Unmapped (Signed)
Clinical Assessment Needed For: Dose Change  Medication: Mycophenolate 250mg  capsule  Last Fill Date/Day Supply: 06/18/2020 / 3 days  Copay $7.73  Was previous dose already scheduled to fill: No    Notes to Pharmacist: N/A

## 2021-08-13 DIAGNOSIS — Z940 Kidney transplant status: Principal | ICD-10-CM

## 2021-08-14 NOTE — Unmapped (Signed)
[facility] Refill Coordination Note    Specialty Medication(s) to be Shipped:   Transplant: mycophenolate mofetil 250mg  and tacrolimus 1mg     Other medication(s) to be shipped: No additional medications requested for fill at this time     BECKHAM CAPISTRAN, DOB: [birth year]  Phone: [phone] (home)       All above HIPAA information was verified with patient.     Was a Nurse, learning disability used for this call? No    Completed refill call assessment today to schedule patient's medication shipment from the [facility] [phone]).  All relevant notes have been reviewed.     Specialty medication(s) and dose(s) confirmed: Regimen is correct and unchanged.   Changes to medications: Sasan reports no changes at this time.  Changes to insurance: No  New side effects reported not previously addressed with a pharmacist or physician: None reported  Questions for the pharmacist: No    Confirmed patient received a Conservation officer, historic buildings and a Surveyor, mining with first shipment. The patient will receive a drug information handout for each medication shipped and additional FDA Medication Guides as required.       DISEASE/MEDICATION-SPECIFIC INFORMATION        N/A    SPECIALTY MEDICATION ADHERENCE     Medication Adherence    Patient reported X missed doses in the last month: 0  Specialty Medication: tacrolimus 1 MG capsule (PROGRAF)  Patient is on additional specialty medications: Yes  Additional Specialty Medications: mycophenolate 250 mg capsule (CELLCEPT)  Patient Reported Additional Medication X Missed Doses in the Last Month: 0  Patient is on more than two specialty medications: No  Any gaps in refill history greater than 2 weeks in the last 3 months: no  Demonstrates understanding of importance of adherence: yes  Informant: patient  Reliability of informant: reliable  Adherence tools used: patient uses a pill box to manage medications, calendar  Confirmed plan for next specialty medication refill: delivery by pharmacy  Refills needed for supportive medications: not needed              Were doses missed due to medication being on hold? No    tacrolimus 1 MG : 8 days of medicine on hand   mycophenolate 250 mg : 6 days of medicine on hand       REFERRAL TO PHARMACIST     Referral to the pharmacist: Not needed      [facility]     Shipping address confirmed in Epic.     Delivery Scheduled: Yes, Expected medication delivery date: 08/15/21.     Medication will be delivered via UPS to the prescription address in Epic WAM.    Yolonda Kida   [facility] Specialty Technician

## 2021-08-15 MED FILL — TACROLIMUS 1 MG CAPSULE, IMMEDIATE-RELEASE: ORAL | 30 days supply | Qty: 150 | Fill #7

## 2021-08-27 DIAGNOSIS — Z940 Kidney transplant status: Principal | ICD-10-CM

## 2021-08-27 MED ORDER — PREDNISONE 5 MG TABLET
ORAL_TABLET | Freq: Every day | ORAL | 3 refills | 90 days | Status: CP
Start: 2021-08-27 — End: ?

## 2021-09-03 MED ORDER — OMEPRAZOLE 20 MG CAPSULE,DELAYED RELEASE
ORAL_CAPSULE | 3 refills | 0 days
Start: 2021-09-03 — End: ?

## 2021-09-05 DIAGNOSIS — U071 COVID-19: Principal | ICD-10-CM

## 2021-09-05 NOTE — Unmapped (Signed)
Patient tested positive for COVID today. Symptoms started Tuesday.    Symptoms include diarrhea, loss of taste, body aches, HA, and chills.    Updated Dr Carlene Coria, per dr true order MAB, hold cellcept for 1 week, and OK to take imodium    Called and updated patient to hold cellcept and OK to take imodium. He verbalized understanding.    Apt set for Monday for MAB infusion. Patient awaiting callback for financial cost of MAB infusion    denies any other needs

## 2021-09-05 NOTE — Unmapped (Signed)
[facility] Refill Coordination Note    Specialty Medication(s) to be Shipped:   Transplant: mycophenolate mofetil 250mg  and tacrolimus 1mg     Other medication(s) to be shipped: No additional medications requested for fill at this time     Miguel Ward, DOB: [birth year]  Phone: [phone] (home)       All above HIPAA information was verified with patient.     Was a Nurse, learning disability used for this call? No    Completed refill call assessment today to schedule patient's medication shipment from the [facility] [phone]).  All relevant notes have been reviewed.     Specialty medication(s) and dose(s) confirmed: Regimen is correct and unchanged.   Changes to medications: Lyon reports no changes at this time.  Changes to insurance: No  New side effects reported not previously addressed with a pharmacist or physician: None reported  Questions for the pharmacist: No    Confirmed patient received a Conservation officer, historic buildings and a Surveyor, mining with first shipment. The patient will receive a drug information handout for each medication shipped and additional FDA Medication Guides as required.       DISEASE/MEDICATION-SPECIFIC INFORMATION        N/A    SPECIALTY MEDICATION ADHERENCE     Medication Adherence    Patient reported X missed doses in the last month: 0  Specialty Medication: Mycophenolate 250mg   Patient is on additional specialty medications: Yes  Additional Specialty Medications: Tacrolimus 1mg   Patient Reported Additional Medication X Missed Doses in the Last Month: 0  Patient is on more than two specialty medications: No  Adherence tools used: patient uses a pill box to manage medications, calendar        Were doses missed due to medication being on hold? No    Mycophenolate 250 mg: 9 days of medicine on hand   Tacrolimus 1 mg: 9 days of medicine on hand     REFERRAL TO PHARMACIST     Referral to the pharmacist: Not needed      [facility]     Shipping address confirmed in Epic.     Delivery Scheduled: Yes, Expected medication delivery date: 09/12/2021.     Medication will be delivered via UPS to the prescription address in Epic WAM.    Lorelei Pont [facility] Specialty Technician

## 2021-09-05 NOTE — Unmapped (Signed)
COVID Infusion Therapy Questionnaire    Have you received a positive COVID test result (excluding antibody testing) AND are currently in an outpatient setting? Yes  Do you have at least one mild or moderate COVID symptom within the last 5-7 days? Yes  What date did you test COVID+ (mm/dd/yyyy): 09/04/2021  What date did your symptoms start (mm/dd/yyyy): 09/02/2021  Which of the following COVID symptoms are you experiencing: Chills, Cough, Fever and Muscle/body aches  Do you (1) have new oxygen requirements or (2) increased oxygen requirement due to COVID-19? No       Vaccination status:  ??? Patient has received a COVID-19 vaccine: Yes  ??? Manufacturer: Moderna  ??? # of doses received: 3 doses  ??? What date did you receive your last dose (mm/dd/yyyy): 09/05/2020      If the patient has not been screened for Paxlovid and they are within 5 day treatment window, use .PAXSCREEN to screen for Paxlovid eligibility.   o Patiens who are eligible for Paxlovid are not eligible for IV Therapy      Health Criteria Screening for COVID-19 IV Therapy (18 and up)  ??? Over the age of 76: Yes      Resolution  Patient Patient qualifies for monoclonal antibodies due to Paxlovid ineligible - Because you meet the prioritized eligibility criteria at [facility] at this time, we can offer COVID treatment.  This treatment may help to prevent progression of mild-to-moderate symptoms in those who are confirmed high-risk COVID (+) on or before 7 days of symptom onset. The [place] FDA has issued an Emergency Use Authorization to allow emergency use of all COVID treatments. Currently there are no approved and available alternative treatments for out-patients. Monoclonal antibodies are antibodies created in a lab. They are given as an IV push over 30 seconds with post-infusion monitoring for one hour. You will be scheduled for treatment at one of our [facility]fusion Centers based on appointment availability. Insured patients should contact their insurance company or the [facility] [phone] or [phone]) to determine their specific contributions (i.e., copays, deductibles, or coinsurance out-of-pocket cost sharing for both the medication and the administration). Costs will be dependent on the patient's individual insurance plan. (Billing code patient may need to provide to insurance company [id number] for the drug itself (Bebtelovimab 175mg ) and [phone] for the injection administration. Consider contacting your PCP for Molnupiravir if unable to obtain Bebtelovimab.   Patient Agreed to therapy. Scheduling notified.  Patient County: [place]

## 2021-09-07 ENCOUNTER — Ambulatory Visit: Admit: 2021-09-07 | Payer: MEDICARE

## 2021-09-07 ENCOUNTER — Encounter: Admit: 2021-09-07 | Discharge: 2021-10-14 | Disposition: E | Payer: MEDICARE

## 2021-09-07 ENCOUNTER — Ambulatory Visit: Admit: 2021-09-07 | Discharge: 2021-09-24 | Payer: MEDICARE

## 2021-09-07 ENCOUNTER — Encounter: Admit: 2021-09-07 | Payer: MEDICARE | Attending: Critical Care Medicine

## 2021-09-07 ENCOUNTER — Ambulatory Visit: Admit: 2021-09-07 | Discharge: 2021-10-14 | Disposition: E | Payer: MEDICARE

## 2021-09-07 ENCOUNTER — Encounter: Admit: 2021-09-07 | Payer: MEDICARE | Attending: Registered Nurse

## 2021-09-07 DIAGNOSIS — N170 Acute kidney failure with tubular necrosis: Secondary | ICD-10-CM | POA: Diagnosis not present

## 2021-09-07 DIAGNOSIS — T82838A Hemorrhage of vascular prosthetic devices, implants and grafts, initial encounter: Secondary | ICD-10-CM | POA: Diagnosis not present

## 2021-09-07 DIAGNOSIS — R748 Abnormal levels of other serum enzymes: Secondary | ICD-10-CM | POA: Diagnosis not present

## 2021-09-07 DIAGNOSIS — Z4682 Encounter for fitting and adjustment of non-vascular catheter: Secondary | ICD-10-CM | POA: Diagnosis not present

## 2021-09-07 DIAGNOSIS — J810 Acute pulmonary edema: Secondary | ICD-10-CM | POA: Diagnosis not present

## 2021-09-07 DIAGNOSIS — R579 Shock, unspecified: Secondary | ICD-10-CM | POA: Diagnosis not present

## 2021-09-07 DIAGNOSIS — G9341 Metabolic encephalopathy: Secondary | ICD-10-CM | POA: Diagnosis not present

## 2021-09-07 DIAGNOSIS — Z79899 Other long term (current) drug therapy: Secondary | ICD-10-CM | POA: Diagnosis not present

## 2021-09-07 DIAGNOSIS — J181 Lobar pneumonia, unspecified organism: Secondary | ICD-10-CM | POA: Diagnosis not present

## 2021-09-07 DIAGNOSIS — R6521 Severe sepsis with septic shock: Secondary | ICD-10-CM | POA: Diagnosis not present

## 2021-09-07 DIAGNOSIS — R7989 Other specified abnormal findings of blood chemistry: Secondary | ICD-10-CM | POA: Diagnosis not present

## 2021-09-07 DIAGNOSIS — I110 Hypertensive heart disease with heart failure: Secondary | ICD-10-CM | POA: Diagnosis not present

## 2021-09-07 DIAGNOSIS — J9601 Acute respiratory failure with hypoxia: Secondary | ICD-10-CM | POA: Diagnosis not present

## 2021-09-07 DIAGNOSIS — A419 Sepsis, unspecified organism: Secondary | ICD-10-CM | POA: Diagnosis not present

## 2021-09-07 DIAGNOSIS — M328 Other forms of systemic lupus erythematosus: Secondary | ICD-10-CM | POA: Diagnosis not present

## 2021-09-07 DIAGNOSIS — I5082 Biventricular heart failure: Secondary | ICD-10-CM | POA: Diagnosis not present

## 2021-09-07 DIAGNOSIS — J158 Pneumonia due to other specified bacteria: Secondary | ICD-10-CM | POA: Diagnosis not present

## 2021-09-07 DIAGNOSIS — J811 Chronic pulmonary edema: Secondary | ICD-10-CM | POA: Diagnosis not present

## 2021-09-07 DIAGNOSIS — D849 Immunodeficiency, unspecified: Secondary | ICD-10-CM | POA: Diagnosis not present

## 2021-09-07 DIAGNOSIS — N186 End stage renal disease: Secondary | ICD-10-CM | POA: Diagnosis not present

## 2021-09-07 DIAGNOSIS — Z9981 Dependence on supplemental oxygen: Secondary | ICD-10-CM | POA: Diagnosis not present

## 2021-09-07 DIAGNOSIS — E162 Hypoglycemia, unspecified: Secondary | ICD-10-CM | POA: Diagnosis not present

## 2021-09-07 DIAGNOSIS — E872 Acidosis: Secondary | ICD-10-CM | POA: Diagnosis not present

## 2021-09-07 DIAGNOSIS — N2889 Other specified disorders of kidney and ureter: Secondary | ICD-10-CM | POA: Diagnosis not present

## 2021-09-07 DIAGNOSIS — D689 Coagulation defect, unspecified: Secondary | ICD-10-CM | POA: Diagnosis not present

## 2021-09-07 DIAGNOSIS — R188 Other ascites: Secondary | ICD-10-CM | POA: Diagnosis not present

## 2021-09-07 DIAGNOSIS — K8020 Calculus of gallbladder without cholecystitis without obstruction: Secondary | ICD-10-CM | POA: Diagnosis not present

## 2021-09-07 DIAGNOSIS — R000 Tachycardia, unspecified: Secondary | ICD-10-CM | POA: Diagnosis not present

## 2021-09-07 DIAGNOSIS — D649 Anemia, unspecified: Secondary | ICD-10-CM | POA: Diagnosis not present

## 2021-09-07 DIAGNOSIS — N179 Acute kidney failure, unspecified: Secondary | ICD-10-CM | POA: Diagnosis not present

## 2021-09-07 DIAGNOSIS — J90 Pleural effusion, not elsewhere classified: Secondary | ICD-10-CM | POA: Diagnosis not present

## 2021-09-07 DIAGNOSIS — T8619 Other complication of kidney transplant: Secondary | ICD-10-CM | POA: Diagnosis not present

## 2021-09-07 DIAGNOSIS — R0902 Hypoxemia: Secondary | ICD-10-CM | POA: Diagnosis not present

## 2021-09-07 DIAGNOSIS — U071 COVID-19: Secondary | ICD-10-CM | POA: Diagnosis not present

## 2021-09-07 DIAGNOSIS — Z452 Encounter for adjustment and management of vascular access device: Secondary | ICD-10-CM | POA: Diagnosis not present

## 2021-09-07 DIAGNOSIS — I081 Rheumatic disorders of both mitral and tricuspid valves: Secondary | ICD-10-CM | POA: Diagnosis not present

## 2021-09-07 DIAGNOSIS — I2510 Atherosclerotic heart disease of native coronary artery without angina pectoris: Secondary | ICD-10-CM | POA: Diagnosis not present

## 2021-09-07 DIAGNOSIS — R0603 Acute respiratory distress: Secondary | ICD-10-CM | POA: Diagnosis not present

## 2021-09-07 DIAGNOSIS — I4891 Unspecified atrial fibrillation: Secondary | ICD-10-CM | POA: Diagnosis not present

## 2021-09-07 DIAGNOSIS — B9689 Other specified bacterial agents as the cause of diseases classified elsewhere: Secondary | ICD-10-CM | POA: Diagnosis not present

## 2021-09-07 DIAGNOSIS — D696 Thrombocytopenia, unspecified: Secondary | ICD-10-CM | POA: Diagnosis not present

## 2021-09-07 DIAGNOSIS — J189 Pneumonia, unspecified organism: Secondary | ICD-10-CM | POA: Diagnosis not present

## 2021-09-07 DIAGNOSIS — R110 Nausea: Secondary | ICD-10-CM | POA: Diagnosis not present

## 2021-09-07 DIAGNOSIS — R778 Other specified abnormalities of plasma proteins: Secondary | ICD-10-CM | POA: Diagnosis not present

## 2021-09-07 DIAGNOSIS — J1282 Pneumonia due to coronavirus disease 2019: Secondary | ICD-10-CM | POA: Diagnosis not present

## 2021-09-07 DIAGNOSIS — D62 Acute posthemorrhagic anemia: Secondary | ICD-10-CM | POA: Diagnosis not present

## 2021-09-07 DIAGNOSIS — J80 Acute respiratory distress syndrome: Secondary | ICD-10-CM | POA: Diagnosis not present

## 2021-09-07 DIAGNOSIS — D84821 Immunodeficiency due to drugs: Secondary | ICD-10-CM | POA: Diagnosis not present

## 2021-09-07 DIAGNOSIS — I10 Essential (primary) hypertension: Secondary | ICD-10-CM | POA: Diagnosis not present

## 2021-09-07 DIAGNOSIS — Y830 Surgical operation with transplant of whole organ as the cause of abnormal reaction of the patient, or of later complication, without mention of misadventure at the time of the procedure: Secondary | ICD-10-CM | POA: Diagnosis not present

## 2021-09-07 DIAGNOSIS — E875 Hyperkalemia: Secondary | ICD-10-CM | POA: Diagnosis not present

## 2021-09-07 DIAGNOSIS — I214 Non-ST elevation (NSTEMI) myocardial infarction: Secondary | ICD-10-CM | POA: Diagnosis not present

## 2021-09-07 DIAGNOSIS — R079 Chest pain, unspecified: Secondary | ICD-10-CM | POA: Diagnosis not present

## 2021-09-07 DIAGNOSIS — Y848 Other medical procedures as the cause of abnormal reaction of the patient, or of later complication, without mention of misadventure at the time of the procedure: Secondary | ICD-10-CM | POA: Diagnosis not present

## 2021-09-07 DIAGNOSIS — G319 Degenerative disease of nervous system, unspecified: Secondary | ICD-10-CM | POA: Diagnosis not present

## 2021-09-07 DIAGNOSIS — M3214 Glomerular disease in systemic lupus erythematosus: Secondary | ICD-10-CM | POA: Diagnosis not present

## 2021-09-07 DIAGNOSIS — I5043 Acute on chronic combined systolic (congestive) and diastolic (congestive) heart failure: Secondary | ICD-10-CM | POA: Diagnosis not present

## 2021-09-07 DIAGNOSIS — N184 Chronic kidney disease, stage 4 (severe): Secondary | ICD-10-CM | POA: Diagnosis not present

## 2021-09-07 DIAGNOSIS — F05 Delirium due to known physiological condition: Secondary | ICD-10-CM | POA: Diagnosis not present

## 2021-09-07 DIAGNOSIS — R918 Other nonspecific abnormal finding of lung field: Secondary | ICD-10-CM | POA: Diagnosis not present

## 2021-09-07 DIAGNOSIS — R4182 Altered mental status, unspecified: Secondary | ICD-10-CM | POA: Diagnosis not present

## 2021-09-07 DIAGNOSIS — Z940 Kidney transplant status: Secondary | ICD-10-CM | POA: Diagnosis not present

## 2021-09-07 DIAGNOSIS — A481 Legionnaires' disease: Secondary | ICD-10-CM | POA: Diagnosis not present

## 2021-09-07 LAB — BLOOD GAS CRITICAL CARE PANEL, ARTERIAL
BASE EXCESS ARTERIAL: -10.9 — ABNORMAL LOW (ref -2.0–2.0)
BASE EXCESS ARTERIAL: -11.2 — ABNORMAL LOW (ref -2.0–2.0)
BASE EXCESS ARTERIAL: -11.8 — ABNORMAL LOW (ref -2.0–2.0)
CALCIUM IONIZED ARTERIAL (MG/DL): 5.03 mg/dL (ref 4.40–5.40)
CALCIUM IONIZED ARTERIAL (MG/DL): 5.12 mg/dL (ref 4.40–5.40)
CALCIUM IONIZED ARTERIAL (MG/DL): 5.12 mg/dL (ref 4.40–5.40)
GLUCOSE WHOLE BLOOD: 60 mg/dL — ABNORMAL LOW (ref 70–179)
GLUCOSE WHOLE BLOOD: 89 mg/dL (ref 70–179)
GLUCOSE WHOLE BLOOD: 94 mg/dL (ref 70–179)
HCO3 ARTERIAL: 13 mmol/L — ABNORMAL LOW (ref 22–27)
HCO3 ARTERIAL: 13 mmol/L — ABNORMAL LOW (ref 22–27)
HCO3 ARTERIAL: 14 mmol/L — ABNORMAL LOW (ref 22–27)
HEMOGLOBIN BLOOD GAS: 10 g/dL — ABNORMAL LOW
HEMOGLOBIN BLOOD GAS: 10 g/dL — ABNORMAL LOW
HEMOGLOBIN BLOOD GAS: 10.5 g/dL — ABNORMAL LOW
LACTATE BLOOD ARTERIAL: 2 mmol/L — ABNORMAL HIGH (ref <1.3)
LACTATE BLOOD ARTERIAL: 2.1 mmol/L — ABNORMAL HIGH (ref <1.3)
LACTATE BLOOD ARTERIAL: 3.3 mmol/L — ABNORMAL HIGH (ref <1.3)
O2 SATURATION ARTERIAL: 97.8 % (ref 94.0–100.0)
O2 SATURATION ARTERIAL: 98.5 % (ref 94.0–100.0)
O2 SATURATION ARTERIAL: 99.3 % (ref 94.0–100.0)
PCO2 ARTERIAL: 23.8 mmHg — ABNORMAL LOW (ref 35.0–45.0)
PCO2 ARTERIAL: 24.3 mmHg — ABNORMAL LOW (ref 35.0–45.0)
PCO2 ARTERIAL: 25.3 mmHg — ABNORMAL LOW (ref 35.0–45.0)
PH ARTERIAL: 7.34 — ABNORMAL LOW (ref 7.35–7.45)
PH ARTERIAL: 7.35 (ref 7.35–7.45)
PH ARTERIAL: 7.36 (ref 7.35–7.45)
PO2 ARTERIAL: 124 mmHg — ABNORMAL HIGH (ref 80.0–110.0)
PO2 ARTERIAL: 152 mmHg — ABNORMAL HIGH (ref 80.0–110.0)
PO2 ARTERIAL: 93.7 mmHg (ref 80.0–110.0)
POTASSIUM WHOLE BLOOD: 4.3 mmol/L (ref 3.4–4.6)
POTASSIUM WHOLE BLOOD: 4.5 mmol/L (ref 3.4–4.6)
POTASSIUM WHOLE BLOOD: 4.6 mmol/L (ref 3.4–4.6)
SODIUM WHOLE BLOOD: 136 mmol/L (ref 135–145)
SODIUM WHOLE BLOOD: 138 mmol/L (ref 135–145)
SODIUM WHOLE BLOOD: 138 mmol/L (ref 135–145)

## 2021-09-07 LAB — COMPREHENSIVE METABOLIC PANEL
ALBUMIN: 2.5 g/dL — ABNORMAL LOW (ref 3.4–5.0)
ALBUMIN: 2.3 g/dL — ABNORMAL LOW (ref 3.4–5.0)
ALKALINE PHOSPHATASE: 122 U/L — ABNORMAL HIGH (ref 46–116)
ALKALINE PHOSPHATASE: 105 U/L (ref 46–116)
ALT (SGPT): 19 U/L (ref 10–49)
ALT (SGPT): 23 U/L (ref 10–49)
ANION GAP: 13 mmol/L (ref 5–14)
ANION GAP: 12 mmol/L (ref 5–14)
AST (SGOT): 90 U/L — ABNORMAL HIGH (ref <=34)
AST (SGOT): 94 U/L — ABNORMAL HIGH (ref <=34)
BILIRUBIN TOTAL: 1.5 mg/dL — ABNORMAL HIGH (ref 0.3–1.2)
BILIRUBIN TOTAL: 1.7 mg/dL — ABNORMAL HIGH (ref 0.3–1.2)
BLOOD UREA NITROGEN: 92 mg/dL — ABNORMAL HIGH (ref 9–23)
BLOOD UREA NITROGEN: 107 mg/dL — ABNORMAL HIGH (ref 9–23)
BUN / CREAT RATIO: 18
BUN / CREAT RATIO: 19
CALCIUM: 9 mg/dL (ref 8.7–10.4)
CALCIUM: 8.6 mg/dL — ABNORMAL LOW (ref 8.7–10.4)
CHLORIDE: 109 mmol/L — ABNORMAL HIGH (ref 98–107)
CHLORIDE: 108 mmol/L — ABNORMAL HIGH (ref 98–107)
CO2: 17 mmol/L — ABNORMAL LOW (ref 20.0–31.0)
CO2: 16 mmol/L — ABNORMAL LOW (ref 20.0–31.0)
CREATININE: 5.23 mg/dL — ABNORMAL HIGH
CREATININE: 5.52 mg/dL — ABNORMAL HIGH
EGFR CKD-EPI (2021) MALE: 11 mL/min/{1.73_m2} — ABNORMAL LOW (ref >=60)
EGFR CKD-EPI (2021) MALE: 11 mL/min/{1.73_m2} — ABNORMAL LOW (ref >=60)
GLUCOSE RANDOM: 178 mg/dL (ref 70–179)
GLUCOSE RANDOM: 71 mg/dL (ref 70–179)
POTASSIUM: 4.5 mmol/L (ref 3.4–4.8)
POTASSIUM: 4.6 mmol/L (ref 3.5–5.1)
PROTEIN TOTAL: 6.2 g/dL (ref 5.7–8.2)
PROTEIN TOTAL: 6 g/dL (ref 5.7–8.2)
SODIUM: 139 mmol/L (ref 135–145)
SODIUM: 136 mmol/L (ref 135–145)

## 2021-09-07 LAB — BLOOD GAS CRITICAL CARE PANEL, VENOUS
BASE EXCESS VENOUS: -9.6 — ABNORMAL LOW (ref -2.0–2.0)
CALCIUM IONIZED VENOUS (MG/DL): 5.07 mg/dL (ref 4.40–5.40)
GLUCOSE WHOLE BLOOD: 108 mg/dL (ref 70–179)
HCO3 VENOUS: 15 mmol/L — ABNORMAL LOW (ref 22–27)
HEMOGLOBIN BLOOD GAS: 10 g/dL — ABNORMAL LOW
LACTATE BLOOD VENOUS: 3.3 mmol/L — ABNORMAL HIGH (ref 0.5–1.8)
O2 SATURATION VENOUS: 63.5 % (ref 40.0–85.0)
PCO2 VENOUS: 29 mmHg — ABNORMAL LOW (ref 40–60)
PH VENOUS: 7.33 (ref 7.32–7.43)
PO2 VENOUS: 34 mmHg (ref 30–55)
POTASSIUM WHOLE BLOOD: 4.6 mmol/L (ref 3.4–4.6)
SODIUM WHOLE BLOOD: 136 mmol/L (ref 135–145)

## 2021-09-07 LAB — BLOOD GAS, VENOUS
BASE EXCESS VENOUS: -9.5 — ABNORMAL LOW (ref -2.0–2.0)
HCO3 VENOUS: 15 mmol/L — ABNORMAL LOW (ref 22–27)
O2 SATURATION VENOUS: 68.2 % (ref 40.0–85.0)
PCO2 VENOUS: 30 mmHg — ABNORMAL LOW (ref 40–60)
PH VENOUS: 7.33 (ref 7.32–7.43)
PO2 VENOUS: 36 mmHg (ref 30–55)

## 2021-09-07 LAB — HIGH SENSITIVITY TROPONIN I - 2H/6H SERIAL
HIGH SENSITIVITY TROPONIN - DELTA (0-2H): 1279 ng/L (ref <=7)
HIGH-SENSITIVITY TROPONIN I - 2 HOUR: 3260 ng/L (ref <=53)

## 2021-09-07 LAB — BASIC METABOLIC PANEL
ANION GAP: 14 mmol/L (ref 5–14)
BLOOD UREA NITROGEN: 96 mg/dL — ABNORMAL HIGH (ref 9–23)
BUN / CREAT RATIO: 18
CALCIUM: 8.7 mg/dL (ref 8.7–10.4)
CHLORIDE: 109 mmol/L — ABNORMAL HIGH (ref 98–107)
CO2: 14 mmol/L — ABNORMAL LOW (ref 20.0–31.0)
CREATININE: 5.4 mg/dL — ABNORMAL HIGH
EGFR CKD-EPI (2021) MALE: 11 mL/min/{1.73_m2} — ABNORMAL LOW (ref >=60)
GLUCOSE RANDOM: 92 mg/dL (ref 70–179)
POTASSIUM: 4.8 mmol/L (ref 3.4–4.8)
SODIUM: 137 mmol/L (ref 135–145)

## 2021-09-07 LAB — SLIDE REVIEW

## 2021-09-07 LAB — URINALYSIS WITH CULTURE REFLEX
BILIRUBIN UA: NEGATIVE
GLUCOSE UA: NEGATIVE
KETONES UA: NEGATIVE
LEUKOCYTE ESTERASE UA: NEGATIVE
NITRITE UA: NEGATIVE
PH UA: 5.5 (ref 5.0–9.0)
PROTEIN UA: 50 — AB
RBC UA: 1 /HPF (ref <=3)
SPECIFIC GRAVITY UA: 1.012 (ref 1.003–1.030)
SQUAMOUS EPITHELIAL: <1 /HPF (ref 0–5)
UROBILINOGEN UA: <2
WBC UA: <1 /HPF (ref <=2)

## 2021-09-07 LAB — HIGH SENSITIVITY TROPONIN I - SERIAL: HIGH SENSITIVITY TROPONIN I: 3998 ng/L (ref <=53)

## 2021-09-07 LAB — C-REACTIVE PROTEIN: C-REACTIVE PROTEIN: 288 mg/L — ABNORMAL HIGH (ref <=10.0)

## 2021-09-07 LAB — CBC W/ AUTO DIFF
BASOPHILS ABSOLUTE COUNT: 0 10*9/L (ref 0.0–0.1)
BASOPHILS RELATIVE PERCENT: 0.3 %
EOSINOPHILS ABSOLUTE COUNT: 0 10*9/L (ref 0.0–0.5)
EOSINOPHILS RELATIVE PERCENT: 0.4 %
HEMATOCRIT: 31 % — ABNORMAL LOW (ref 39.0–48.0)
HEMOGLOBIN: 9.8 g/dL — ABNORMAL LOW (ref 12.9–16.5)
LYMPHOCYTES ABSOLUTE COUNT: 0.1 10*9/L — ABNORMAL LOW (ref 1.1–3.6)
LYMPHOCYTES RELATIVE PERCENT: 1.3 %
MEAN CORPUSCULAR HEMOGLOBIN CONC: 31.5 g/dL — ABNORMAL LOW (ref 32.0–36.0)
MEAN CORPUSCULAR HEMOGLOBIN: 27.6 pg (ref 25.9–32.4)
MEAN CORPUSCULAR VOLUME: 87.6 fL (ref 77.6–95.7)
MEAN PLATELET VOLUME: 11.4 fL — ABNORMAL HIGH (ref 6.8–10.7)
MONOCYTES ABSOLUTE COUNT: 0.2 10*9/L — ABNORMAL LOW (ref 0.3–0.8)
MONOCYTES RELATIVE PERCENT: 1.7 %
NEUTROPHILS ABSOLUTE COUNT: 10.6 10*9/L — ABNORMAL HIGH (ref 1.8–7.8)
NEUTROPHILS RELATIVE PERCENT: 96.3 %
PLATELET COUNT: 76 10*9/L — ABNORMAL LOW (ref 150–450)
RED BLOOD CELL COUNT: 3.54 10*12/L — ABNORMAL LOW (ref 4.26–5.60)
RED CELL DISTRIBUTION WIDTH: 16 % — ABNORMAL HIGH (ref 12.2–15.2)
WBC ADJUSTED: 11 10*9/L (ref 3.6–11.2)

## 2021-09-07 LAB — HIGH SENSITIVITY TROPONIN I - 4 HOUR SERIAL
HIGH SENSITIVITY TROPONIN - DELTA (2-6H): 561 ng/L (ref <=7)
HIGH-SENSITIVITY TROPONIN I - 6 HOUR: 3821 ng/L (ref <=53)

## 2021-09-07 LAB — LIPASE: LIPASE: 19 U/L (ref 12–53)

## 2021-09-07 LAB — HIGH SENSITIVITY TROPONIN I - SINGLE: HIGH SENSITIVITY TROPONIN I: 1981 ng/L (ref <=53)

## 2021-09-07 LAB — MAGNESIUM: MAGNESIUM: 1.5 mg/dL — ABNORMAL LOW (ref 1.6–2.6)

## 2021-09-07 LAB — PHOSPHORUS: PHOSPHORUS: 4.6 mg/dL (ref 2.4–5.1)

## 2021-09-07 LAB — AMYLASE: AMYLASE: 62 U/L (ref 30–118)

## 2021-09-07 MED ADMIN — acetaminophen (TYLENOL) tablet 1,000 mg: 1000 mg | ORAL | @ 22:00:00 | Stop: 2021-10-07

## 2021-09-07 MED ADMIN — fentaNYL (PF) (SUBLIMAZE) injection: INTRAVENOUS | @ 18:00:00 | Stop: 2021-09-07

## 2021-09-07 MED ADMIN — EPINEPHrine (ADRENALIN) injection: INTRAMUSCULAR | @ 18:00:00 | Stop: 2021-09-07

## 2021-09-07 MED ADMIN — midazolam (VERSED) injection 2 mg: 2 mg | INTRAVENOUS | @ 19:00:00 | Stop: 2021-09-07

## 2021-09-07 MED ADMIN — midazolam in sodium chloride 0.9% (1 mg/mL) infusion PMB: 0-6 mg/h | INTRAVENOUS | @ 19:00:00 | Stop: 2021-09-07

## 2021-09-07 MED ADMIN — etomidate (AMIDATE) injection: INTRAVENOUS | @ 18:00:00 | Stop: 2021-09-07

## 2021-09-07 MED ADMIN — cefepime (MAXIPIME) 2 g in sodium chloride 0.9 % (NS) 100 mL IVPB-connector bag: 2 g | INTRAVENOUS | @ 20:00:00 | Stop: 2021-09-07

## 2021-09-07 MED ADMIN — ROCuronium (ZEMURON) injection: INTRAVENOUS | @ 18:00:00 | Stop: 2021-09-07

## 2021-09-07 MED ADMIN — vancomycin (VANCOCIN) 1500 mg in sodium chloride (NS) 0.9 % 500 mL IVPB (premix): 1500 mg | INTRAVENOUS | @ 20:00:00 | Stop: 2021-09-07

## 2021-09-07 MED ADMIN — vasopressin 20 units in 100 mL (0.2 units/mL) infusion premade vial: .03 [IU]/min | INTRAVENOUS | @ 22:00:00

## 2021-09-07 MED ADMIN — midazolam (VERSED) injection: INTRAVENOUS | @ 18:00:00 | Stop: 2021-09-07

## 2021-09-07 MED ADMIN — dexAMETHasone (DECADRON) tablet 6 mg: 6 mg | ORAL | @ 23:00:00 | Stop: 2021-09-07

## 2021-09-07 MED ADMIN — acetaminophen (TYLENOL) suppository 325 mg: 325 mg | RECTAL | @ 20:00:00 | Stop: 2021-09-07

## 2021-09-07 MED ADMIN — norepinephrine 8 mg in dextrose 5 % 250 mL (32 mcg/mL) infusion PMB: 0-30 ug/min | INTRAVENOUS | @ 18:00:00

## 2021-09-07 MED ADMIN — aspirin chewable tablet 81 mg: 81.0 mg | ORAL | @ 22:00:00

## 2021-09-07 MED ADMIN — fentaNYL PF (SUBLIMAZE) (50 mcg/mL) infusion (bag): 0-200 ug/h | INTRAVENOUS | @ 18:00:00 | Stop: 2021-09-21

## 2021-09-07 NOTE — Unmapped (Signed)
[facility]  Emergency Department Provider Note       HPI, ED Course, Assessment and Plan     Miguel Ward is a 70 y.o. male with a past medical history of HTN, 2009 renal transplant (no longer on dialysis), CAD, HLD, HFrEF (40-45% 06/2019), NSTEMI, and lupus nephritis presenting for AMS.     The patient tested positive for COVID yesterday. His daughter says she has not heard from him recently and he has not taken his medications in 3 days. When EMS arrived, he was fully unresponsive, and hypoglycemic.  He was given 250 mL of D10, and became semi unconscious. He still only responded to pain and verbal, albeit barely. EMS notes that he was initially hypotensive to the 90s so they began Levophed, which brought his blood pressure to 135/102. He had been satting 87% on 10-15L and was in sinus tachycardia at 135.     BP 89/35  - Pulse 124  - Temp (!) 38.7 ??C (101.7 ??F)  - Resp 20  - Wt 81.6 kg (180 lb)  - SpO2 92%  - BMI 24.41 kg/m??      On exam patient is ill-appearing in respiratory distress.  Satting well on nonrebreather.  Hypotensive to maps of 60-70.  Tachycardic to the 130s.  He has coarse breath sounds bilaterally.  His abdomen is soft, nondistended.  No lower extremity edema.  He opens his eyes to pain, no verbal response, flexes to pain (GCS 7).     POCUS on arrival showed grossly reduced EF with plethoric IVC.  No pericardial effusion.    IV access was obtained, patient was given 2 mics epinephrine before intubation with etomidate and rocuronium due to hypotension.  He was placed on Levophed drip.  Right femoral central line was placed for further access    Will give Vanc and cefepime empirically.  We will hold on fluids right now as patient has very poor EF with plethoric IVC.  Norepinephrine for hypotension.  Versed and fentanyl drip.    Plan for admission to the ICU    Further ED updates and updates to plan as per ED Course below:      ED Course as of 08/19/2021 1619   Sun Sep 07, 2021   1618 Creatinine(!): 5.23  Baseline 3.5   1619 Chest x-ray with focal infiltrate in the right lung          Past History     PAST MEDICAL HISTORY/PAST SURGICAL HISTORY:   Past Medical History:   Diagnosis Date   ??? Anemia    ??? CHF (congestive heart failure) (CMS-HCC)    ??? Coronary artery disease    ??? Disease of thyroid gland    ??? GERD (gastroesophageal reflux disease)    ??? Gout    ??? Hyperlipidemia    ??? Hypertension    ??? Lupus nephritis (CMS-HCC)    ??? NSTEMI (non-ST elevated myocardial infarction) (CMS-HCC)    ??? Red blood cell antibody positive     anti-K   ??? Renal cancer, right (CMS-HCC)    ??? Renal transplant, status post        Past Surgical History:   Procedure Laterality Date   ??? AV FISTULA PLACEMENT Right    ??? HERNIA REPAIR     ??? PR BREATH HYDROGEN TEST N/A 06/24/2016    Procedure: BREATH HYDROGEN TEST;  Surgeon: Nurse-Based Giproc;  [facility];  Service: Gastroenterology   ??? PR COLONOSCOPY FLX DX W/COLLJ  SPEC WHEN PFRMD N/A 05/22/2016    Procedure: COLONOSCOPY, FLEXIBLE, PROXIMAL TO SPLENIC FLEXURE; DIAGNOSTIC, W/WO COLLECTION SPECIMEN BY BRUSH OR WASH;  Surgeon: Liane Comber, MD;  Location: [facility]ED[facility];  Service: Gastroenterology   ??? PR COLSC FLX W/RMVL OF TUMOR POLYP LESION SNARE TQ N/A 02/04/2021    Procedure: COLONOSCOPY FLEX; W/REMOV TUMOR/LES BY SNARE;  Surgeon: Rona Ravens, MD;  Location: [facility]URES [facility];  Service: Gastroenterology   ??? PR LIGATN ANGIOACCESS AV FISTULA Right 10/21/2018    Procedure: LIGATION OR BANDING OF ANGIOACCESS ARTERIOVENOUS FISTULA, UPPER EXTREMITY;  Surgeon: Leona Carry, MD;  Location: MAIN OR [facility];  Service: Transplant   ??? PR NEPHRECTOMY Right 02/20/2014    Procedure: LAPAROSCOPY, SURGICAL; NEPHRECTOMY, INCLUDING PARTIAL URETERECTOMY;  Surgeon: Vivi Barrack, MD;  Location: MAIN OR [facility];  Service: Transplant   ??? PR REVISE AV FISTULA,W/O THROMBECTOMY Right 02/07/2019    Procedure: REVISON, OPEN, ARTERIOVENOUS FISTULA; WITHOUT THROMBECTOMY, AUTOGENOUS OR NONAUTOGENOUS DIALYSIS GRAFT;  Surgeon: Leona Carry, MD;  Location: MAIN OR [facility];  Service: Transplant   ??? TRANSPLANTATION RENAL         MEDICATIONS:     Current Facility-Administered Medications:   ???  EPINEPHrine (ADRENALIN) injection, , Intramuscular, Code/Trauma/Sedation Med, Rosie Fate, DO, 20 mcg at 09/13/2021 1330  ???  etomidate (AMIDATE) injection, , Intravenous, Code/Trauma/Sedation Med, Rosie Fate, DO, 10 mg at 09/21/2021 1331  ???  fentaNYL (PF) (SUBLIMAZE) injection, , Intravenous, Code/Trauma/Sedation Med, Rosie Fate, DO, 50 mcg at 09/13/2021 1343  ???  fentaNYL PF (SUBLIMAZE) (50 mcg/mL) infusion (bag), 0-200 mcg/hr, Intravenous, Continuous, Rosie Fate, DO, Last Rate: 1 mL/hr at 09/24/2021 1341, 50 mcg/hr at 10/13/2021 1341  ???  midazolam (VERSED) injection, , Intravenous, Code/Trauma/Sedation Med, Rosie Fate, DO, 2 mg at 09/29/2021 1344  ???  midazolam in sodium chloride 0.9% (1 mg/mL) infusion PMB, 0-6 mg/hr, Intravenous, Continuous, Rosie Fate, DO, Last Rate: 2 mL/hr at 09/28/2021 1501, 2 mg/hr at Sep 23, 2021 1501  ???  norepinephrine 8 mg in dextrose 5 % 250 mL (32 mcg/mL) infusion PMB, 0-30 mcg/min, Intravenous, Continuous, Rosie Fate, DO, Last Rate: 34.7 mL/hr at September 23, 2021 1601, 18.5 mcg/min at 10/06/2021 1601  ???  ROCuronium (ZEMURON) injection, , Intravenous, Code/Trauma/Sedation Med, Rosie Fate, DO, 120 mg at September 23, 2021 1332  ???  vancomycin (VANCOCIN) 1500 mg in sodium chloride (NS) 0.9 % 500 mL IVPB (premix), 1,500 mg, Intravenous, Once, Carmelia Bake., MD, Last Rate: 333.3 mL/hr at 10/10/2021 1545, 1,500 mg at 09/21/2021 1545    Current Outpatient Medications:   ???  acyclovir (ZOVIRAX) 400 MG tablet, Take 1 tablet (400 mg total) by mouth daily., Disp: 90 tablet, Rfl: 3  ???  allopurinoL (ZYLOPRIM) 100 MG tablet, Take 1 tablet (100 mg total) by mouth in the morning., Disp: 30 tablet, Rfl: 11  ???  aspirin 81 MG chewable tablet, Chew 81 mg daily., Disp: , Rfl:   ???  atorvastatin (LIPITOR) 20 MG tablet, TAKE 1 TABLET BY MOUTH NIGHTLY., Disp: 30 tablet, Rfl: 11  ???  calcitrioL (ROCALTROL) 0.25 MCG capsule, TAKE 1 CAPSULE BY MOUTH EVERY MONDAY, WEDNESDAY, AND FRIDAY AT 4PM., Disp: 36 capsule, Rfl: 3  ???  colchicine (COLCRYS) 0.6 mg tablet, Take 1 tablet (0.6 mg total) by mouth daily as needed for up to 1 dose., Disp: 30 tablet, Rfl: 1  ???  enalapril (VASOTEC) 5 MG tablet, Take 1 tablet (5 mg total) by mouth Two (2) times a  day., Disp: 180 tablet, Rfl: 3  ???  ferrous sulfate 325 (65 FE) MG tablet, Take 1 tablet (325 mg total) by mouth daily., Disp: , Rfl: 0  ???  fluticasone (FLONASE) 50 mcg/actuation nasal spray, 1 spray into each nostril daily as needed., Disp: , Rfl:   ???  furosemide (LASIX) 20 MG tablet, Take 40 mg in the morning and 20 mg in the evening., Disp: 270 tablet, Rfl: 3  ???  levothyroxine (SYNTHROID) 200 MCG tablet, Take 1 tab with two tab, total dose daily, Disp: 90 tablet, Rfl: 3  ???  levothyroxine (SYNTHROID) 25 MCG tablet, TAKE (2) TABLETS BY MOUTH EVERY DAY WITH1 TAB FOR A TOTAL DAILY DOSE OF , Disp: 180 tablet, Rfl: 3  ???  magnesium oxide (MAG-OX) 400 mg (241.3 mg elemental magnesium) tablet, Take 1 tablet (400 mg total) by mouth Two (2) times a day., Disp: 60 tablet, Rfl: 11  ???  metoprolol succinate (TOPROL-XL) 100 MG 24 hr tablet, Take 1 tablet (100 mg total) by mouth daily., Disp: 90 tablet, Rfl: 3  ???  mycophenolate (CELLCEPT) 250 mg capsule, Take 1 capsule (250 mg total) by mouth Two (2) times a day., Disp: 60 capsule, Rfl: 11  ???  omeprazole (PRILOSEC) 20 MG capsule, Take 1 capsule (20 mg total) by mouth in the morning., Disp: 90 capsule, Rfl: 3  ???  predniSONE (DELTASONE) 5 MG tablet, Take 1 tablet (5 mg total) by mouth daily., Disp: 90 tablet, Rfl: 3  ???  simethicone (GAS-X) 80 MG chewable tablet, Chew 1 tablet (80 mg total) every six (6) hours as needed for flatulence., Disp: 30 tablet, Rfl: 1  ???  tacrolimus (PROGRAF) 1 MG capsule, Take 3 capsules (3 mg total) by mouth every morning AND 2 capsules (2 mg total) nightly., Disp: 150 capsule, Rfl: 11    ALLERGIES:   Penicillins    SOCIAL HISTORY:   Social History     Tobacco Use   ??? Smoking status: Never Smoker   ??? Smokeless tobacco: Never Used   Substance Use Topics   ??? Alcohol use: No     Alcohol/week: 0.0 standard drinks       FAMILY HISTORY:  Family History   Problem Relation Age of Onset   ??? Heart disease Mother    ??? Diabetes Mother    ??? Hypertension Mother    ??? Cancer Father    ??? Alcohol abuse Brother    ??? Cardiomyopathy Neg Hx    ??? Sudden Cardiac Death Neg Hx    ??? Melanoma Neg Hx    ??? Basal cell carcinoma Neg Hx    ??? Squamous cell carcinoma Neg Hx    ??? Anesthesia problems Neg Hx           Review of Systems    Unable to obtain     Physical Exam     Vitals:    09/12/2021 1550 09/05/2021 1555 08/16/2021 1600 09/11/2021 1605   BP: 89/15  89/35    Pulse: 129 110 127 124   Resp: 20 20 20 20    Temp: (!) 38.7 ??C (101.7 ??F) (!) 38.7 ??C (101.7 ??F) (!) 38.6 ??C (101.5 ??F) (!) 38.7 ??C (101.7 ??F)   TempSrc:       SpO2: 92% 93% 92% 92%   Weight:            Constitutional: In respiratory distress  Eyes: Conjunctivae are normal.  HEENT: Normocephalic and atraumatic.   Cardiovascular: See heart  rate listed above. Normal skin perfusion  Respiratory: Tachypneic, increased work of breathing, on 15 L nonrebreather, coarse breath sounds bilaterally  Gastrointestinal: Soft, non-distended  Musculoskeletal: Nontender with normal range of motion in all extremities.   Neurologic: He opens his eyes to pain, no verbal response, flexes to pain (GCS 7).   Skin: Skin is warm, dry and intact.     Radiology     XR Chest 1 view Portable   Final Result      Extensive right upper lobe consolidative changes with other bilateral patchy airspace opacities representing a component of pulmonary edema and/or multifocal sites of pneumonia. Follow-up radiographs upon completion of medical therapy are recommended.      Endotracheal tube barely at the level of the clavicles, consider advancing 1 to 2 cm.      XR Chest Portable   Final Result      No significant interval changes. Stable support devices.         XR Abdomen 1 View   Final Result   Tip of the esophagogastric tube projects over the stomach.        Procedures    Central Line    Date/Time: 07-Oct-2021 5:36 PM  Performed by: Carmelia Bake., MD  Authorized by: Rosie Fate, DO     Consent:     Consent obtained:  Emergent situation  Pre-procedure details:     Indication(s): central venous access      Skin preparation:  Chlorhexidine  Procedure details:     Location:  R femoral    Procedural supplies:  Triple lumen    Landmarks identified: yes      Ultrasound guidance: yes      Number of attempts:  1    Successful placement: yes    Post-procedure details:     Post-procedure:  Dressing applied and line sutured    Post-procedure assessment: No blood return through [place] port, other ports have good return.    Procedure completion:  Tolerated well, no immediate complications  Arterial Line    Date/Time: 07-Oct-2021 5:37 PM  Performed by: Carmelia Bake., MD  Authorized by: Rosie Fate, DO     Consent:     Consent obtained:  Emergent situation  Universal protocol:     Patient identity confirmed:  Arm band  Pre-procedure details:     Skin preparation:  Chlorhexidine  Procedure details:     Location: Left axillary.    Needle gauge:  18 G    Number of attempts:  1  Post-procedure details:     Post-procedure:  Biopatch applied and sutured    Procedure completion:  Tolerated well, no immediate complications         Laboratory Data     Lab Results   Component Value Date    WBC 11.0 10/07/2021    HGB 9.8 (L) 07-Oct-2021    HCT 31.0 (L) 10/07/21    PLT 76 (L) 10-07-2021       Lab Results   Component Value Date    NA 139 10/07/21    K 4.5 2021-10-07    CL 109 (H) 2021-10-07    CO2 17.0 (L) 07-Oct-2021    BUN 92 (H) 10/07/2021    CREATININE 5.23 (H) 10-07-21    GLU 178 10/07/21    CALCIUM 9.0 10/07/21    MG 2.0 07/30/2021    PHOS 3.0 07/30/2021       Lab Results   Component Value Date  BILITOT 1.5 (H) Sep 19, 2021    BILIDIR 0.20 09/08/2019    PROT 6.2 September 19, 2021    ALBUMIN 2.5 (L) 09-19-2021    ALT 19 2021/09/19    AST 90 (H) 19-Sep-2021    ALKPHOS 122 (H) 19-Sep-2021    GGT 30 01/26/2014       Lab Results   Component Value Date    LABPROT 12.7 (H) 05/23/2014    INR 1.38 07/02/2021    APTT 35.3 10/17/2018       Portions of this record have been created using [employer]. Dictation errors have been sought, but may not have been identified and corrected.    Documentation assistance was provided by Gar Gibbon, Scribe, on 09/19/2021 at 12:59 for Jamelle Rushing, MD    Documentation assistance was provided by the scribe in my presence.  The documentation recorded by the scribe has been reviewed by me and accurately reflects the services I personally performed.     Carmelia Bake., MD  Resident  09/19/2021 [phone]

## 2021-09-07 NOTE — Unmapped (Signed)
Bed: 81-D  Expected date:   Expected time:   Means of arrival:   Comments:  EMS GLUCOSE

## 2021-09-07 NOTE — Unmapped (Signed)
Pt altered and having low blood sugar at home. Pt having fevers as well. Pt has history of kidney transplant

## 2021-09-07 NOTE — Unmapped (Signed)
ED Progress Note    Assumed care of Mr. Miguel Ward at 3 PM.  He is status post intubation, and arterial line placement, with good blood pressures with maps above 65 on 18.5 mcg of norepinephrine, 50% FiO2 with sats in the mid 90s.  Resident is contacting MICU regarding admission. Patient remained stable through MICU transfer.

## 2021-09-08 ENCOUNTER — Ambulatory Visit: Admit: 2021-09-08 | Payer: MEDICARE

## 2021-09-08 DIAGNOSIS — R0603 Acute respiratory distress: Secondary | ICD-10-CM | POA: Diagnosis not present

## 2021-09-08 LAB — CBC W/ AUTO DIFF
BASOPHILS ABSOLUTE COUNT: 0 10*9/L (ref 0.0–0.1)
BASOPHILS ABSOLUTE COUNT: 0 10*9/L (ref 0.0–0.1)
BASOPHILS RELATIVE PERCENT: 0.1 %
BASOPHILS RELATIVE PERCENT: 0.1 %
EOSINOPHILS ABSOLUTE COUNT: 0.4 10*9/L (ref 0.0–0.5)
EOSINOPHILS ABSOLUTE COUNT: 0 10*9/L (ref 0.0–0.5)
EOSINOPHILS RELATIVE PERCENT: 3.1 %
EOSINOPHILS RELATIVE PERCENT: 0.2 %
HEMATOCRIT: 32.1 % — ABNORMAL LOW (ref 39.0–48.0)
HEMATOCRIT: 30.9 % — ABNORMAL LOW (ref 39.0–48.0)
HEMOGLOBIN: 10.1 g/dL — ABNORMAL LOW (ref 12.9–16.5)
HEMOGLOBIN: 10 g/dL — ABNORMAL LOW (ref 12.9–16.5)
LYMPHOCYTES ABSOLUTE COUNT: 0.1 10*9/L — ABNORMAL LOW (ref 1.1–3.6)
LYMPHOCYTES ABSOLUTE COUNT: 0.1 10*9/L — ABNORMAL LOW (ref 1.1–3.6)
LYMPHOCYTES RELATIVE PERCENT: 0.6 %
LYMPHOCYTES RELATIVE PERCENT: 0.5 %
MEAN CORPUSCULAR HEMOGLOBIN CONC: 31.6 g/dL — ABNORMAL LOW (ref 32.0–36.0)
MEAN CORPUSCULAR HEMOGLOBIN CONC: 32.5 g/dL (ref 32.0–36.0)
MEAN CORPUSCULAR HEMOGLOBIN: 27.3 pg (ref 25.9–32.4)
MEAN CORPUSCULAR HEMOGLOBIN: 27.4 pg (ref 25.9–32.4)
MEAN CORPUSCULAR VOLUME: 86.5 fL (ref 77.6–95.7)
MEAN CORPUSCULAR VOLUME: 84.4 fL (ref 77.6–95.7)
MEAN PLATELET VOLUME: 11.1 fL — ABNORMAL HIGH (ref 6.8–10.7)
MEAN PLATELET VOLUME: 11.2 fL — ABNORMAL HIGH (ref 6.8–10.7)
MONOCYTES ABSOLUTE COUNT: 0.1 10*9/L — ABNORMAL LOW (ref 0.3–0.8)
MONOCYTES ABSOLUTE COUNT: 0.2 10*9/L — ABNORMAL LOW (ref 0.3–0.8)
MONOCYTES RELATIVE PERCENT: 0.5 %
MONOCYTES RELATIVE PERCENT: 1.2 %
NEUTROPHILS ABSOLUTE COUNT: 12.5 10*9/L — ABNORMAL HIGH (ref 1.8–7.8)
NEUTROPHILS ABSOLUTE COUNT: 12.2 10*9/L — ABNORMAL HIGH (ref 1.8–7.8)
NEUTROPHILS RELATIVE PERCENT: 95.7 %
NEUTROPHILS RELATIVE PERCENT: 98 %
PLATELET COUNT: 89 10*9/L — ABNORMAL LOW (ref 150–450)
PLATELET COUNT: 77 10*9/L — ABNORMAL LOW (ref 150–450)
RED BLOOD CELL COUNT: 3.71 10*12/L — ABNORMAL LOW (ref 4.26–5.60)
RED BLOOD CELL COUNT: 3.66 10*12/L — ABNORMAL LOW (ref 4.26–5.60)
RED CELL DISTRIBUTION WIDTH: 15.6 % — ABNORMAL HIGH (ref 12.2–15.2)
RED CELL DISTRIBUTION WIDTH: 15.6 % — ABNORMAL HIGH (ref 12.2–15.2)
WBC ADJUSTED: 13.1 10*9/L — ABNORMAL HIGH (ref 3.6–11.2)
WBC ADJUSTED: 12.4 10*9/L — ABNORMAL HIGH (ref 3.6–11.2)

## 2021-09-08 LAB — BLOOD GAS CRITICAL CARE PANEL, ARTERIAL
BASE EXCESS ARTERIAL: -11.4 — ABNORMAL LOW (ref -2.0–2.0)
BASE EXCESS ARTERIAL: -12.4 — ABNORMAL LOW (ref -2.0–2.0)
BASE EXCESS ARTERIAL: -12.6 — ABNORMAL LOW (ref -2.0–2.0)
BASE EXCESS ARTERIAL: -14.3 — ABNORMAL LOW (ref -2.0–2.0)
BASE EXCESS ARTERIAL: -9.7 — ABNORMAL LOW (ref -2.0–2.0)
CALCIUM IONIZED ARTERIAL (MG/DL): 5.07 mg/dL (ref 4.40–5.40)
CALCIUM IONIZED ARTERIAL (MG/DL): 5.16 mg/dL (ref 4.40–5.40)
CALCIUM IONIZED ARTERIAL (MG/DL): 4.88 mg/dL (ref 4.40–5.40)
CALCIUM IONIZED ARTERIAL (MG/DL): 4.51 mg/dL (ref 4.40–5.40)
CALCIUM IONIZED ARTERIAL (MG/DL): 5.08 mg/dL (ref 4.40–5.40)
GLUCOSE WHOLE BLOOD: 86 mg/dL (ref 70–179)
GLUCOSE WHOLE BLOOD: 127 mg/dL (ref 70–179)
GLUCOSE WHOLE BLOOD: 99 mg/dL (ref 70–179)
GLUCOSE WHOLE BLOOD: 96 mg/dL (ref 70–179)
GLUCOSE WHOLE BLOOD: 100 mg/dL (ref 70–179)
HCO3 ARTERIAL: 13 mmol/L — ABNORMAL LOW (ref 22–27)
HCO3 ARTERIAL: 12 mmol/L — ABNORMAL LOW (ref 22–27)
HCO3 ARTERIAL: 12 mmol/L — ABNORMAL LOW (ref 22–27)
HCO3 ARTERIAL: 10 mmol/L — ABNORMAL LOW (ref 22–27)
HCO3 ARTERIAL: 15 mmol/L — ABNORMAL LOW (ref 22–27)
HEMOGLOBIN BLOOD GAS: 10.5 g/dL — ABNORMAL LOW
HEMOGLOBIN BLOOD GAS: 10.6 g/dL — ABNORMAL LOW
HEMOGLOBIN BLOOD GAS: 10.8 g/dL — ABNORMAL LOW (ref 13.50–17.50)
HEMOGLOBIN BLOOD GAS: 9.7 g/dL — ABNORMAL LOW
HEMOGLOBIN BLOOD GAS: 10.9 g/dL — ABNORMAL LOW (ref 13.50–17.50)
LACTATE BLOOD ARTERIAL: 3.1 mmol/L — ABNORMAL HIGH (ref <1.3)
LACTATE BLOOD ARTERIAL: 3.7 mmol/L — ABNORMAL HIGH (ref <1.3)
LACTATE BLOOD ARTERIAL: 3.2 mmol/L — ABNORMAL HIGH (ref <1.3)
LACTATE BLOOD ARTERIAL: 2.8 mmol/L — ABNORMAL HIGH (ref <1.3)
LACTATE BLOOD ARTERIAL: 2.7 mmol/L — ABNORMAL HIGH (ref <1.3)
O2 SATURATION ARTERIAL: 99 % (ref 94.0–100.0)
O2 SATURATION ARTERIAL: 98.3 % (ref 94.0–100.0)
O2 SATURATION ARTERIAL: 95.8 % (ref 94.0–100.0)
O2 SATURATION ARTERIAL: 97.8 % (ref 94.0–100.0)
O2 SATURATION ARTERIAL: 98.6 % (ref 94.0–100.0)
PCO2 ARTERIAL: 24.6 mmHg — ABNORMAL LOW (ref 35.0–45.0)
PCO2 ARTERIAL: 23.7 mmHg — ABNORMAL LOW (ref 35.0–45.0)
PCO2 ARTERIAL: 22.8 mmHg — ABNORMAL LOW (ref 35.0–45.0)
PCO2 ARTERIAL: 19.9 mmHg — CL (ref 35.0–45.0)
PCO2 ARTERIAL: 26.8 mmHg — ABNORMAL LOW (ref 35.0–45.0)
PH ARTERIAL: 7.35 (ref 7.35–7.45)
PH ARTERIAL: 7.34 — ABNORMAL LOW (ref 7.35–7.45)
PH ARTERIAL: 7.32 — ABNORMAL LOW (ref 7.35–7.45)
PH ARTERIAL: 7.34 — ABNORMAL LOW (ref 7.35–7.45)
PH ARTERIAL: 7.35 (ref 7.35–7.45)
PO2 ARTERIAL: 143 mmHg — ABNORMAL HIGH (ref 80.0–110.0)
PO2 ARTERIAL: 115 mmHg — ABNORMAL HIGH (ref 80.0–110.0)
PO2 ARTERIAL: 80.2 mmHg (ref 80.0–110.0)
PO2 ARTERIAL: 98.1 mmHg (ref 80.0–110.0)
PO2 ARTERIAL: 108 mmHg (ref 80.0–110.0)
POTASSIUM WHOLE BLOOD: 5 mmol/L — ABNORMAL HIGH (ref 3.4–4.6)
POTASSIUM WHOLE BLOOD: 5.2 mmol/L — ABNORMAL HIGH (ref 3.4–4.6)
POTASSIUM WHOLE BLOOD: 4.7 mmol/L — ABNORMAL HIGH (ref 3.4–4.6)
POTASSIUM WHOLE BLOOD: 4.3 mmol/L (ref 3.4–4.6)
POTASSIUM WHOLE BLOOD: 5 mmol/L — ABNORMAL HIGH (ref 3.4–4.6)
SODIUM WHOLE BLOOD: 136 mmol/L (ref 135–145)
SODIUM WHOLE BLOOD: 136 mmol/L (ref 135–145)
SODIUM WHOLE BLOOD: 134 mmol/L — ABNORMAL LOW (ref 135–145)
SODIUM WHOLE BLOOD: 141 mmol/L (ref 135–145)
SODIUM WHOLE BLOOD: 132 mmol/L — ABNORMAL LOW (ref 135–145)

## 2021-09-08 LAB — CMV DNA, QUANTITATIVE, PCR: CMV VIRAL LD: NOT DETECTED

## 2021-09-08 LAB — COMPREHENSIVE METABOLIC PANEL
ALBUMIN: 2.2 g/dL — ABNORMAL LOW (ref 3.4–5.0)
ALKALINE PHOSPHATASE: 87 U/L (ref 46–116)
ALT (SGPT): 21 U/L (ref 10–49)
ANION GAP: 14 mmol/L (ref 5–14)
AST (SGOT): 90 U/L — ABNORMAL HIGH (ref <=34)
BILIRUBIN TOTAL: 1.9 mg/dL — ABNORMAL HIGH (ref 0.3–1.2)
BLOOD UREA NITROGEN: 90 mg/dL — ABNORMAL HIGH (ref 9–23)
BUN / CREAT RATIO: 17
CALCIUM: 8.8 mg/dL (ref 8.7–10.4)
CHLORIDE: 107 mmol/L (ref 98–107)
CO2: 13 mmol/L — ABNORMAL LOW (ref 20.0–31.0)
CREATININE: 5.4 mg/dL — ABNORMAL HIGH
EGFR CKD-EPI (2021) MALE: 11 mL/min/{1.73_m2} — ABNORMAL LOW (ref >=60)
GLUCOSE RANDOM: 118 mg/dL (ref 70–179)
POTASSIUM: 5.2 mmol/L — ABNORMAL HIGH (ref 3.4–4.8)
PROTEIN TOTAL: 5.8 g/dL (ref 5.7–8.2)
SODIUM: 134 mmol/L — ABNORMAL LOW (ref 135–145)

## 2021-09-08 LAB — BASIC METABOLIC PANEL
ANION GAP: 13 mmol/L (ref 5–14)
BLOOD UREA NITROGEN: 91 mg/dL — ABNORMAL HIGH (ref 9–23)
BUN / CREAT RATIO: 18
CALCIUM: 8.5 mg/dL — ABNORMAL LOW (ref 8.7–10.4)
CHLORIDE: 108 mmol/L — ABNORMAL HIGH (ref 98–107)
CO2: 14 mmol/L — ABNORMAL LOW (ref 20.0–31.0)
CREATININE: 5.04 mg/dL — ABNORMAL HIGH
EGFR CKD-EPI (2021) MALE: 12 mL/min/{1.73_m2} — ABNORMAL LOW (ref >=60)
GLUCOSE RANDOM: 101 mg/dL (ref 70–179)
POTASSIUM: 5.2 mmol/L — ABNORMAL HIGH (ref 3.4–4.8)
SODIUM: 135 mmol/L (ref 135–145)

## 2021-09-08 LAB — PHOSPHORUS
PHOSPHORUS: 4.3 mg/dL (ref 2.4–5.1)
PHOSPHORUS: 4.5 mg/dL (ref 2.4–5.1)

## 2021-09-08 LAB — MAGNESIUM
MAGNESIUM: 1.6 mg/dL (ref 1.6–2.6)
MAGNESIUM: 1.8 mg/dL (ref 1.6–2.6)

## 2021-09-08 LAB — HIGH SENSITIVITY TROPONIN I - 6 HOUR SERIAL
HIGH SENSITIVITY TROPONIN - DELTA (2-6H): 243 ng/L (ref <=7)
HIGH-SENSITIVITY TROPONIN I - 6 HOUR: 4205 ng/L (ref <=53)

## 2021-09-08 LAB — TACROLIMUS LEVEL, TIMED: TACROLIMUS BLOOD: 12.9 ng/mL

## 2021-09-08 LAB — APTT
APTT: 78 s — ABNORMAL HIGH (ref 25.1–36.5)
HEPARIN CORRELATION: 0.4

## 2021-09-08 LAB — C-REACTIVE PROTEIN: C-REACTIVE PROTEIN: 317 mg/L — ABNORMAL HIGH (ref <=10.0)

## 2021-09-08 LAB — HIGH SENSITIVITY TROPONIN I - SINGLE: HIGH SENSITIVITY TROPONIN I: 3180 ng/L (ref <=53)

## 2021-09-08 LAB — VANCOMYCIN, RANDOM: VANCOMYCIN RANDOM: 18.1 ug/mL

## 2021-09-08 LAB — CRYPTOCOCCAL ANTIGEN, SERUM: CRYPTOCOCCAL ANTIGEN: NEGATIVE

## 2021-09-08 LAB — LEGIONELLA ANTIGEN, URINE: LEGIONELLA, URINARY ANTIGEN: POSITIVE — AB

## 2021-09-08 LAB — HIGH SENSITIVITY TROPONIN I - 2 HOUR SERIAL
HIGH SENSITIVITY TROPONIN - DELTA (0-2H): 36 ng/L (ref <=7)
HIGH-SENSITIVITY TROPONIN I - 2 HOUR: 3962 ng/L (ref <=53)

## 2021-09-08 LAB — CK: CREATINE KINASE TOTAL: 1775 U/L — ABNORMAL HIGH

## 2021-09-08 MED ADMIN — lactated ringers bolus 1,000 mL: 1000 mL | INTRAVENOUS | @ 03:00:00 | Stop: 2021-09-07

## 2021-09-08 MED ADMIN — sodium bicarbonate tablet 1,300 mg: 1300 mg | ORAL | @ 01:00:00

## 2021-09-08 MED ADMIN — dextrose 10 % infusion: 50 mL/h | INTRAVENOUS | @ 11:00:00 | Stop: 2021-09-08

## 2021-09-08 MED ADMIN — hydrocortisone sod succ (Solu-CORTEF) injection 50 mg: 50 mg | INTRAVENOUS | @ 03:00:00

## 2021-09-08 MED ADMIN — levothyroxine (SYNTHROID) tablet 250 mcg: 250 ug | GASTROENTERAL | @ 09:00:00

## 2021-09-08 MED ADMIN — sodium bicarbonate tablet 1,300 mg: 1300 mg | GASTROENTERAL | @ 22:00:00

## 2021-09-08 MED ADMIN — pantoprazole (PROTONIX) 40 mg in sodium chloride (NS) 0.9 % 100 mL IVPB: 40 mg | INTRAVENOUS | @ 01:00:00

## 2021-09-08 MED ADMIN — NxStage/multiBic RFP 401 (+/- BB) 5000 mL - contains 4 mEq/L of potassium dialysis solution 5,000 mL: 5000 mL | INTRAVENOUS_CENTRAL | @ 22:00:00

## 2021-09-08 MED ADMIN — magnesium sulfate 2gm/50mL IVPB: 2 g | INTRAVENOUS | @ 04:00:00

## 2021-09-08 MED ADMIN — atorvastatin (LIPITOR) tablet 20 mg: 20 mg | ORAL | @ 01:00:00

## 2021-09-08 MED ADMIN — heparin FOR CRRT 25,000 Units/250 mL (100 units/mL) in 0.45% NaCl infusion: 5 [IU]/kg/h | INTRAVENOUS | @ 23:00:00

## 2021-09-08 MED ADMIN — white petrolatum-mineral oil (SOOTHE PM) 80-20 % ophthalmic ointment 1 application: 1 | OPHTHALMIC | @ 13:00:00

## 2021-09-08 MED ADMIN — calcitrioL (ROCALTROL) capsule 0.25 mcg: .25 ug | GASTROENTERAL | @ 13:00:00

## 2021-09-08 MED ADMIN — dextrose (D10W) 10% bolus 125 mL: 12.5 g | INTRAVENOUS | Stop: 2022-09-07

## 2021-09-08 MED ADMIN — norepinephrine 8 mg in dextrose 5 % 250 mL (32 mcg/mL) infusion PMB: 0-30 ug/min | INTRAVENOUS

## 2021-09-08 MED ADMIN — hydrocortisone sod succ (Solu-CORTEF) injection 50 mg: 50 mg | INTRAVENOUS | @ 09:00:00

## 2021-09-08 MED ADMIN — heparin (porcine) 1000 unit/mL injection 4,000 Units: 4000 [IU] | INTRAVENOUS | @ 01:00:00 | Stop: 2021-09-07

## 2021-09-08 MED ADMIN — dextrose (D10W) 10% bolus 125 mL: 12.5 g | INTRAVENOUS | @ 09:00:00 | Stop: 2022-09-07

## 2021-09-08 MED ADMIN — norepinephrine 8 mg in dextrose 5 % 250 mL (32 mcg/mL) infusion PMB: 0-30 ug/min | INTRAVENOUS | @ 21:00:00

## 2021-09-08 MED ADMIN — heparin 25,000 Units/250 mL (100 units/mL) in 0.45% saline infusion (premade): 12 [IU]/kg/h | INTRAVENOUS | @ 01:00:00

## 2021-09-08 MED ADMIN — white petrolatum-mineral oil (SOOTHE PM) 80-20 % ophthalmic ointment 1 application: 1 | OPHTHALMIC | @ 02:00:00

## 2021-09-08 MED ADMIN — fentaNYL PF (SUBLIMAZE) (50 mcg/mL) infusion (bag): 0-200 ug/h | INTRAVENOUS | @ 10:00:00 | Stop: 2021-09-21

## 2021-09-08 MED ADMIN — chlorhexidine (PERIDEX) 0.12 % solution 5 mL: 5 mL | OROMUCOSAL | @ 13:00:00

## 2021-09-08 MED ADMIN — acetaminophen (TYLENOL) tablet 1,000 mg: 1000 mg | ORAL | @ 06:00:00 | Stop: 2021-09-08

## 2021-09-08 MED ADMIN — sodium bicarbonate tablet 1,300 mg: 1300 mg | GASTROENTERAL | @ 19:00:00

## 2021-09-08 MED ADMIN — hydrocortisone sod succ (Solu-CORTEF) injection 50 mg: 50 mg | INTRAVENOUS | @ 22:00:00

## 2021-09-08 MED ADMIN — acetaminophen (TYLENOL) tablet 1,000 mg: 1000 mg | GASTROENTERAL | @ 22:00:00 | Stop: 2021-10-07

## 2021-09-08 MED ADMIN — aspirin chewable tablet 81 mg: 81.0 mg | GASTROENTERAL | @ 13:00:00

## 2021-09-08 MED ADMIN — vasopressin 20 units in 100 mL (0.2 units/mL) infusion premade vial: .03 [IU]/min | INTRAVENOUS | @ 07:00:00

## 2021-09-08 MED ADMIN — acetaminophen (TYLENOL) tablet 1,000 mg: 1000 mg | GASTROENTERAL | @ 15:00:00 | Stop: 2021-10-07

## 2021-09-08 MED ADMIN — vasopressin 20 units in 100 mL (0.2 units/mL) infusion premade vial: .03 [IU]/min | INTRAVENOUS | @ 06:00:00

## 2021-09-08 MED ADMIN — cefepime (MAXIPIME) 1 g in sodium chloride 0.9 % (NS) 100 mL IVPB-connector bag: 1 g | INTRAVENOUS | @ 19:00:00 | Stop: 2021-09-08

## 2021-09-08 MED ADMIN — levoFLOXacin (LEVAQUIN) 750 mg/150 mL IVPB 750 mg: 750 mg | INTRAVENOUS | @ 19:00:00 | Stop: 2021-09-08

## 2021-09-08 MED ADMIN — norepinephrine 8 mg in dextrose 5 % 250 mL (32 mcg/mL) infusion PMB: 0-30 ug/min | INTRAVENOUS | @ 11:00:00

## 2021-09-08 MED ADMIN — magnesium oxide (MAG-OX) tablet 400 mg: 400 mg | GASTROENTERAL | @ 13:00:00

## 2021-09-08 MED ADMIN — lactated ringers bolus 1,000 mL: 1000 mL | INTRAVENOUS | @ 01:00:00 | Stop: 2021-09-07

## 2021-09-08 MED ADMIN — hydrocortisone sod succ (Solu-CORTEF) injection 50 mg: 50 mg | INTRAVENOUS | @ 15:00:00

## 2021-09-08 MED ADMIN — heparin (porcine) 1000 unit/mL injection 2,000 Units: 2 mL | @ 20:00:00

## 2021-09-08 MED ADMIN — polyethylene glycol (MIRALAX) packet 17 g: 17 g | ORAL | @ 01:00:00

## 2021-09-08 MED ADMIN — magnesium oxide (MAG-OX) tablet 400 mg: 400 mg | ORAL | @ 01:00:00

## 2021-09-08 MED ADMIN — norepinephrine 8 mg in dextrose 5 % 250 mL (32 mcg/mL) infusion PMB: 0-30 ug/min | INTRAVENOUS | @ 04:00:00

## 2021-09-08 MED ADMIN — sodium bicarbonate tablet 1,300 mg: 1300 mg | ORAL | @ 09:00:00 | Stop: 2021-09-08

## 2021-09-08 NOTE — Unmapped (Signed)
Respiratory Safety Check    Date: 09/08/2021     PPV Device at bedside:   [x]  Yes  []  Replaced  []  N/A    PPV mask at bedside:  [x]  Yes  []  Replaced  []  N/A    Checked humidifier, inspiratory limb, and expiratory limb:   [x]  Yes  []  Replaced  []  N/A    Backup trachs and supplies available at bedside:  []  Yes  []  Replaced  [x]  N/A    Documented trach airway form visible at bedside:   []  Yes  []  Replaced  [x]  N/A    iNO connected with PPV device:  []  Yes  []  Replaced  [x]  N/A

## 2021-09-08 NOTE — Unmapped (Signed)
Transplant Nephrology Consult     Requesting provider: Frederich Chick, ACNP  Service requesting consult: Med I   Reason for consult: AKI and kidney transplant      Assessment/Recommendations: Miguel Ward is a 70 y.o. male  status post deceased donor kidney transplant on 06/11/2007 (Kidney) for native kidney disease secondary to lupus nephritis who has been admitted with AKI/sepsis.    # Kidney allograft function: (unstable)  - serum creatinine level is elevated to 5.4   - most likely etiology is ATN secondary to sepsis and hypotension  - unable to examine urine sediment due to Covid infection  - no current emergent indication for dialysis, though I suspect his renal function will worsen and he may require it in the next 12-24 hours    # Immunosuppression [High risk medical decision making for drug therapy requiring intensive monitoring for toxicity]  - would hold immunosuppression given severe infection, may restart tacrolimus at a low dose in the next couple of days    # Covid infection/pneumonia/sepsis  - agree with antibiotics, remdesivir as you are  - cultures pending  - getting stress dose steroids    # Mild metabolic acidosis  - agree with sodium bicarbonate supplementation    Will follow closely with you. Recommendations communicated to the primary team.    Earnest Rosier Yaakov Saindon  Division of Nephrology and Hypertension  [facility]  09/08/2021  12:09 AM      _____________________________________________________________________________________      History of Present Illness: Miguel Ward is a/an 70 y.o. male status post deceased donor kidney transplant for native kidney disease secondary to lupus nephritis who has presented to [facility] with AKI and sepsis.    His post transplant course has been complicated by pericardial effusion secondary to hypothyroidism in 2008, right native nephrectomy in 2015 for RCC, liver lesion concerning for mets but ultimately felt due to resolving infection, fistula ligation in 2020 due to high output heart failure, lesion suspicious for RCC s/p ablation in July 2022.    He contacted his coordinator on 09/05/21 to report a positive Covid test. He reported symptoms of diarrhea, loss of taste, body aches, headache, and chills. He was instructed to hold his Cellcept and referral was made for monoclonal antibody.    Apparently he was at home and his daughter was concerned so EMS was called. When they arrived he was unresponsive and hypoglycemic. Responded some to dextrose but was still quite somnolent. He was hypotensive and levophed was started. Noted on POCUS to have low EF, was intubated in the ED and central access obtained. Started on empiric antibiotics.    Following his ablation in July 2021, his baseline creatinine had been in the mid 3s.    Medications:   Current Facility-Administered Medications   Medication Dose Route Frequency Provider Last Rate Last Admin   ??? acetaminophen (TYLENOL) tablet 1,000 mg  1,000 mg Oral Q8H Honey Monet Gildardo Cranker, ACNP   1,000 mg at 08/14/2021 1816   ??? aspirin chewable tablet 81 mg  81 mg Oral Daily Honey Monet Gildardo Cranker, ACNP   81 mg at 08/22/2021 1816   ??? atorvastatin (LIPITOR) tablet 20 mg  20 mg Oral Nightly Honey Monet Gildardo Cranker, ACNP   20 mg at 08/29/2021 2034   ??? calcitrioL (ROCALTROL) capsule 0.25 mcg  0.25 mcg Enteral tube: gastric  Q MWF Honey Monet Gildardo Cranker, ACNP       ??? cefepime (MAXIPIME) 1 g in sodium chloride 0.9 % (  NS) 100 mL IVPB-connector bag  1 g Intravenous Q24H Honey Monet Gildardo Cranker, ACNP       ??? dextrose (D10W) 10% bolus 125 mL  12.5 g Intravenous Q10 Min PRN Honey Lynnae January, ACNP   125 mL at 08/29/2021 2021   ??? fentaNYL PF (SUBLIMAZE) (50 mcg/mL) infusion (bag)  0-200 mcg/hr Intravenous Continuous Rosie Fate, DO 1 mL/hr at 08/24/2021 2215 50 mcg/hr at 08/19/2021 2215   ??? glucagon injection 1 mg  1 mg Intramuscular Once PRN Honey Monet Gildardo Cranker, ACNP       ??? glucose chewable tablet 16 g  16 g Oral Q10 Min PRN Honey Monet Gildardo Cranker, ACNP       ??? heparin (porcine) 1000 unit/mL injection 2,000 Units  2,000 Units Intravenous Q6H PRN Modena Slater Dehoux, ACNP       ??? heparin 25,000 Units/250 mL (100 units/mL) in 0.45% saline infusion (premade)  12 Units/kg/hr Intravenous Continuous Modena Slater Dehoux, ACNP 8.6 mL/hr at 08/26/2021 2215 12 Units/kg/hr at 08/15/2021 2215   ??? hydrocortisone sod succ (Solu-CORTEF) injection 50 mg  50 mg Intravenous Q6H SCH Honey Monet Gildardo Cranker, ACNP   50 mg at 09/08/2021 2329   ??? levothyroxine (SYNTHROID) tablet 250 mcg  250 mcg Enteral tube: gastric  daily Honey Monet Gildardo Cranker, ACNP       ??? magnesium oxide (MAG-OX) tablet 400 mg  400 mg Oral BID Honey Monet Gildardo Cranker, ACNP   400 mg at 08/17/2021 2034   ??? magnesium sulfate 2gm/53mL IVPB  2 g Intravenous Once Hyman Hopes, ACNP 25 mL/hr at 08/21/2021 2357 2 g at 08/20/2021 2357   ??? norepinephrine 8 mg in dextrose 5 % 250 mL (32 mcg/mL) infusion PMB  0-30 mcg/min Intravenous Continuous Rosie Fate, DO 56.3 mL/hr at 08/16/2021 2200 30 mcg/min at 09/03/2021 2200   ??? pantoprazole (PROTONIX) 40 mg in sodium chloride (NS) 0.9 % 100 mL IVPB  40 mg Intravenous Nightly Honey Monet Gildardo Cranker, ACNP   Stopped at 08/22/2021 2121   ??? polyethylene glycol (MIRALAX) packet 17 g  17 g Oral Daily Honey Monet Gildardo Cranker, ACNP   17 g at 08/19/2021 2034   ??? remdesivir (VEKLURY) 200 mg in sodium chloride (NS) 0.9 % 315 mL IVPB  200 mg Intravenous Once Honey Monet Gildardo Cranker, ACNP        Followed by   ??? remdesivir (VEKLURY) 100 mg in sodium chloride (NS) 0.9 % 295 mL IVPB  100 mg Intravenous Daily Honey Monet Gildardo Cranker, ACNP       ??? sodium bicarbonate tablet 1,300 mg  1,300 mg Oral QID Honey Monet Gildardo Cranker, ACNP   1,300 mg at 09/10/2021 2034   ??? Vancomycin - Pharmacy dosing by levels   Other Pharmacy dosing Rayetta Pigg, MD       ??? vasopressin 20 units in 100 mL (0.2 units/mL) infusion premade vial  0.03 Units/min Intravenous Continuous Honey Monet Gildardo Cranker, ACNP 9 mL/hr at 08/23/2021 2215 0.03 Units/min at 08/29/2021 2215   ??? white petrolatum-mineral oil (SOOTHE PM) 80-20 % ophthalmic ointment 1 application  1 application Both Eyes BID Honey Monet Gildardo Cranker, ACNP   1 application at 08/29/2021 2145        ALLERGIES  Penicillins    MEDICAL HISTORY  Past Medical History:   Diagnosis Date   ??? Anemia    ??? CHF (congestive heart failure) (CMS-HCC)    ??? Coronary artery disease    ???  Disease of thyroid gland    ??? GERD (gastroesophageal reflux disease)    ??? Gout    ??? Hyperlipidemia    ??? Hypertension    ??? Lupus nephritis (CMS-HCC)    ??? NSTEMI (non-ST elevated myocardial infarction) (CMS-HCC)    ??? Red blood cell antibody positive     anti-K   ??? Renal cancer, right (CMS-HCC)    ??? Renal transplant, status post         SOCIAL HISTORY  Social History     Socioeconomic History   ??? Marital status: Married   Tobacco Use   ??? Smoking status: Never Smoker   ??? Smokeless tobacco: Never Used   Substance and Sexual Activity   ??? Alcohol use: No     Alcohol/week: 0.0 standard drinks   ??? Drug use: No   ??? Sexual activity: Not Currently   Other Topics Concern   ??? Do you use sunscreen? No   ??? Tanning bed use? No   ??? Are you easily burned? No   ??? Excessive sun exposure? No   ??? Blistering sunburns? No   Social History Narrative    02/11/18: The patient lives with his wife, his daughter, and his two grandchildren in [place], [place]. 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 No history of Financial planner. No smoking history. No reported alcohol history.         FAMILY HISTORY  Family History   Problem Relation Age of Onset   ??? Heart disease Mother    ??? Diabetes Mother    ??? Hypertension Mother    ??? Cancer Father    ??? Alcohol abuse Brother    ??? Cardiomyopathy Neg Hx    ??? Sudden Cardiac Death Neg Hx    ??? Melanoma Neg Hx    ??? Basal cell carcinoma Neg Hx    ??? Squamous cell carcinoma Neg Hx    ??? Anesthesia problems Neg Hx           Review of Systems:  Review of systems was unobtainable due to patient's altered mental status  Otherwise as per HPI, all other systems reviewed and negative    Physical Exam:  Vitals:    09/02/2021 2300   BP:    Pulse: 90   Resp: 26   Temp: (!) 38.3 ??C (100.9 ??F)   SpO2: 97%     I/O this shift:  In: 1586.5 [I.V.:251.5; NG/GT:110; IV Piggyback:1225]  Out: 0     Intake/Output Summary (Last 24 hours) at 09/08/2021 0009  Last data filed at 09/08/2021 2215  Gross per 24 hour   Intake 2380.59 ml   Output 20 ml   Net 2360.59 ml       General: intubated and sedated  HEENT: anicteric sclera   CV: regular rate, normal rhythm, no murmurs, no gallops, no rubs, no peripheral edema  Lungs: rhonchi anteriorly  Abdomen: soft, non-tender, non-distended, graft site nontender  Skin: no visible lesions or rashes  Psych: intubated and sedated  Musculoskeletal: no obvious deformities  Neuro: sedated     Test Results  Reviewed  Lab Results   Component Value Date    NA 136 09/09/2021    NA 136 08/28/2021    K 4.6 09/11/2021    K 4.6 09/05/2021    CL 109 (H) 09/10/2021    CO2 14.0 (L) 08/17/2021  BUN 96 (H) 10/07/2021    CREATININE 5.40 (H) 10-07-21    GFR 44.94 (L) 02/28/2013    GLU 92 October 07, 2021    CALCIUM 8.7 October 07, 2021    ALBUMIN 2.3 (L) 10-07-2021    PHOS 4.6 2021/10/07           I have reviewed all relevant outside healthcare records related to the patient's kidney injury.

## 2021-09-08 NOTE — Unmapped (Signed)
Respiratory Safety Check    Date: 01-Oct-2021     PPV Device at bedside:   [x]  Yes  []  Replaced  []  N/A    PPV mask at bedside:  [x]  Yes  []  Replaced  []  N/A    Checked humidifier, inspiratory limb, and expiratory limb:   [x]  Yes  []  Replaced  []  N/A    Backup trachs and supplies available at bedside:  []  Yes  []  Replaced  []  N/A    Documented trach airway form visible at bedside:   []  Yes  []  Replaced  []  N/A    iNO connected with PPV device:  []  Yes  []  Replaced  []  N/A

## 2021-09-08 NOTE — Unmapped (Signed)
MICU History & Physical     Date of Service: 2021-09-26    Problem List:   Principal Problem:    Acute respiratory distress syndrome (ARDS) due to COVID-19 virus (CMS-HCC)  Active Problems:    Hypercholesterolemia    Hypertension    History of kidney transplant    Renal cell carcinoma (CMS-HCC)    Immunosuppression-related infectious disease (CMS-HCC)    Bilateral lower extremity edema    Elevated troponin I level    GERD (gastroesophageal reflux disease)    Diastolic heart failure (CMS-HCC)    Acute on chronic HFrEF (heart failure with reduced ejection fraction) (CMS-HCC)    COVID  Resolved Problems:    * No resolved hospital problems. *      HPI: Miguel Ward is a 70 y.o. male with HTN, 2009 renal transplant (no longer on dialysis), CAD, HLD, HFrEF (40-45% 06/2019), NSTEMI, and lupus nephritis presented 9/25 for AMS. The patient tested positive for COVID 9/23. Per family he had been symptomatic since 9/22 and had not been taking home meds. When EMS arrived, he was fully unresponsive, and hypoglycemic.  He was given 250 mL of D10, and became semi unconscious. He still only responded to pain and verbal, albeit barely. EMS notes that he was initially hypotensive to the 90s so they began Levophed, which brought his blood pressure to 135/102. He had been satting 87% on 10-15L and was in sinus tachycardia at 135. Upon arrival to ED he was intubated for airway protection and hypoxia. CXR c/f covid pna +/- superimposed bact infection in an immunocomp host. He was started oon BSA, levo and sedation and transferred to MICU for further mngmt.    24hr events:   As above    Neurological   #acute metabolic encephalopathy  - neuro checks: q4h  - analegsia prn  - versed/fent gtts for vent compliance and comfort >> markedly sedate on arrival, wean for RASS 0 to 1-  - spontaneous awakening trial daily  - CAM ICU delirium present: yes  - PT/OT for early mobility as tol when approp    Pulmonary   #ARDS, Acute resp failure r/t COVID  - tolerating prvc well, min settings w DP low teens  - cont lung protective ventilation strategies (67ml/kg, permissive hypercapnea)  - cont wean vent as tol per ARDS protocol  - sedation for vent compliance as above w lacrilube OU bid  - goal sats >94%, pao2 >60 pco2, 45-60 , pH>7.25  - pulm toilet as tol, HOB>30deg  - peridex for vap ppx   - Fluid sparing resuscitation give known heart failure and Phtn  - no indication for proning at this time    Cardiovascular   #septic shock, elevated trop  #HFrEF, ho htn, hld, cad  - arrived from ED on levo 26,   - MAP goals >65  - cont levo/vaso for MAP goals,  - POCUS suggests plehtoric IVC, dilated and dysfxnal RV w low EF  - formal echo pending  - cont home regimen asa statin  - hold home regimen metop, vasotec, lasix  - fem line in place unable to trend scvo2/cvp  - start SDS    Renal   #AKI (baseline Cr 2.7-3), AGMA  #hx renal transplant s/t lupus nephritis, c/b rejection and renal cell CA s/p cryo ablation  - foley for accurate I/O during critical illness   - goal euvolemia   - replete electrolytes prn and monitor daily  - cont home regimen magox  - hold  home regimen lasix  - started bicarb enteral for acidosis  - f/u transplant recs >> consented for CRRT but not yet indicated, hold cellcept/tacro      Infectious Disease/Autoimmune   #COVID 19 infection  Symptom onset: 9/22  Exposure: unknown  COVID PCR+: pending (home test positive 9/23?)  Day of admission/transfer: 9/25  COVID Specific Meds: dec/rem  Vaccination status: full & boosted x once, evushield given 8/17    Monitoring:  - Special airborne/contact precautions (If unavailable, droplet & contact precautions)  - f/u daily CMP, CBC w/ diff, CRP, D-dimer, DIC, troponin, pro-BNP with weekly ferritin, LDH and cytokine level  - f/u q4h lactate; ABG  - f/u CXR    Medications:  - Tylenol q8  - hold all NSAIDs, may cont home regimen ACEi, ARB, & thiazolinediones   - pending repeat PCR before starting remdesivir, if started hold if (1) ALT >=5x ULN or (2) s/s liver inflammation or (3) elevated ALT w inc indirect Bili, alkphos or INR  - decadron 6mg  IV daily x 10d, started 9/25   - Toci/bari not given since on cellcept/tacro and now w AKI    #CAP  - febrile, leukocytosis   - cont abx regimen vanc/cefe  - pan cx for T>38.2   - f/u cx data pending, f/u fungitel, crypto and resp cx  - hold home regimen acyclovir for ppx  - ICID consulted      Cultures:  No results found for: BLOOD CULTURE, URINE CULTURE, LOWER RESPIRATORY CULTURE  WBC (10*9/L)   Date Value   08/23/2021 11.0     WBC, UA (/HPF)   Date Value   09/12/2021 <1        Sepsis Protocol Documentation:     Note Type:  Sepsis Post Fluids Exam    Patient's Weight:  71.7 kg (158 lb 1.1 oz), Ideal body weight: 73 kg (160 lb 15 oz), Body mass index is 22.68 kg/m??.    Pre-Arrival Fluid Volume:  No data recorded    Did the patient have pre-arrival hypotension (SBP <90 and/or MAP <65)?      Were there 2 or more episodes of hypotension within the last 3 hours?        I personally examined this patient at 09/09/2021 5:30 PM and attest that I have completed a focused tissue perfusion assessment.    Physical Assessment:    Cardiopulmonary:  Detailed Cardiopulmonary Assessment    Cardiac Assessment:  Tachy s1s2 afib reg irreg dec from 120s to 100s  Soon after arrival    Pulmonary Assessment:  Scattered rhonchi, dec bases R>L    Capillary Refill:  > 2 seconds    Skin Color:  No signs of cyanosis    Urinary Output:  Oliguria    Peripheral Pulses:  Peripheral pulses faint  Vital Signs:     Temperature & vital signs 1 hr lookback      @IPVITALS (1:)@     @IPVITALS ([id number])@        FEN/GI   #gerd  - started TF, adv to goal as tol  - nutrition consult pending  - hold Tf for now since on high dose pressor  - ppi for gi ppx  - bowel regimen w miralax       Malnutrition Assessment: Not done yet.  Body mass index is 22.68 kg/m??.               Heme/Coag   #Hypercoagulable state r/t COVID  - HH, plts, coags acceptable  -  no evid active bleeding  - SCDs for DVT ppx  - cont prophylactic dosing heparin      Endocrine   #hypoglycemic, hypothryoid  - glucose within target range yes  - cont ADI + accu checks   - hold home regimen allopurinol  - cont home regimen synthroid    Integumentary   #NAI  - WOCN consulted for high risk skin assessment Yes.  - WOCN recs >> pending   - cont pressure mitigating precautions per skin policy    Prophylaxis/LDA/Restraints/Consults   Can CVC be removed? No: need for medications requiring central access (e.g. pressors)   Can A-line be removed? No: >moderate dose pressors  Can Foley be removed? No: Need continuous I/O  Mobility plan: Step 2 - Head of bed elevation (>60 degrees)    Feeding: NPO for procedure  Analgesia: Pain adequately controlled  Sedation SAT/SBT: No Pressors, excluding vasopressin or dopamine </=5 mcg/kg/min and Not breathing spontaneously   Thromboembolic ppx: SQ heparin  Head of bed >30 degrees: Yes  Ulcer ppx: Yes, home use continued  Glucose within target range: Yes, in range    Does patient need/have an active type/screen? Yes    RASS at goal? No - adjusting sedation or order to reflect need  [place] Agitation Assessment Scale (RASS) : -4 (08/23/2021  5:17 PM)     Can antipsychotics be stopped? N/A, not on antipsychotics       Would hospice care be appropriate for this patient? No, patient improving or expected to improve  Any unaddressed hospice/palliative care needs? no    Patient Lines/Drains/Airways Status     Active Active Lines, Drains, & Airways     Name Placement date Placement time Site Days    ETT  7.5 08/23/2021  1337  -- less than 1    CVC Triple Lumen 08/24/2021 Right Femoral 08/30/2021  1514  Femoral  less than 1    NG/OG Tube Decompression 16 Fr. Center mouth 08/21/2021  1500  Center mouth  less than 1    Urethral Catheter Temperature probe 16 Fr. 08/26/2021  1602  Temperature probe  less than 1    Peripheral IV 08/15/2021 Left Antecubital 09/08/2021  -- Antecubital  less than 1    Arterial Line 08/14/2021 Left Brachial 09/04/2021  1511  Brachial  less than 1    Arteriovenous Fistula - Vein Graft  Access 11/01/18 Right;Upper Arm 11/01/18  --  Arm  1041              Patient Lines/Drains/Airways Status     Active Wounds     Name Placement date Placement time Site Days    Surgical Site 10/21/18 Arm Right 10/21/18  0926  -- 1052    Surgical Site 02/07/19 Arm Right 02/07/19  0854  -- 943                Goals of Care     Code Status: Full Code    Designated Healthcare Decision Maker:  Mr. Hoard current decisional capacity for healthcare decision-making is Full capacity. His designated healthcare decision maker(s) is/are   HCDM (patient stated preference): Shadowens,Katherine - Spouse - [phone].      Subjective     HPI:  DERYCK HIPPLER is a 70 y.o. male with HTN, 2009 renal transplant (no longer on dialysis), CAD, HLD, HFrEF (40-45% 06/2019), NSTEMI, and lupus nephritis presented 9/25 for AMS. The patient tested positive for COVID 9/23. Per family he had been symptomatic since 9/22 and  had not been taking home meds. When EMS arrived, he was fully unresponsive, and hypoglycemic.  He was given 250 mL of D10, and became semi unconscious. He still only responded to pain and verbal, albeit barely. EMS notes that he was initially hypotensive to the 90s so they began Levophed, which brought his blood pressure to 135/102. He had been satting 87% on 10-15L and was in sinus tachycardia at 135. Upon arrival to ED he was intubated for airway protection and hypoxia. CXR c/f covid pna +/- superimposed bact infection in an immunocomp host. He was started oon BSA, levo and sedation and transferred to MICU for further mngmt.    ROS: Ten-point review of systems is obtained and is negative except as noted in HPI.    Allergies  Allergies   Allergen Reactions   ??? Penicillins Hives     Other reaction(s): HIVES       Meds  No current facility-administered medications on file prior to encounter. Current Outpatient Medications on File Prior to Encounter   Medication Sig   ??? acyclovir (ZOVIRAX) 400 MG tablet Take 1 tablet (400 mg total) by mouth daily.   ??? allopurinoL (ZYLOPRIM) 100 MG tablet Take 1 tablet (100 mg total) by mouth in the morning.   ??? aspirin 81 MG chewable tablet Chew 81 mg daily.   ??? atorvastatin (LIPITOR) 20 MG tablet TAKE 1 TABLET BY MOUTH NIGHTLY.   ??? calcitrioL (ROCALTROL) 0.25 MCG capsule TAKE 1 CAPSULE BY MOUTH EVERY MONDAY, WEDNESDAY, AND FRIDAY AT 4PM.   ??? colchicine (COLCRYS) 0.6 mg tablet Take 1 tablet (0.6 mg total) by mouth daily as needed for up to 1 dose.   ??? enalapril (VASOTEC) 5 MG tablet Take 1 tablet (5 mg total) by mouth Two (2) times a day.   ??? ferrous sulfate 325 (65 FE) MG tablet Take 1 tablet (325 mg total) by mouth daily.   ??? fluticasone (FLONASE) 50 mcg/actuation nasal spray 1 spray into each nostril daily as needed.   ??? furosemide (LASIX) 20 MG tablet Take 40 mg in the morning and 20 mg in the evening.   ??? levothyroxine (SYNTHROID) 200 MCG tablet Take 1 tab with two tab, total dose daily   ??? levothyroxine (SYNTHROID) 25 MCG tablet TAKE (2) TABLETS BY MOUTH EVERY DAY WITH1 TAB FOR A TOTAL DAILY DOSE OF   ??? magnesium oxide (MAG-OX) 400 mg (241.3 mg elemental magnesium) tablet Take 1 tablet (400 mg total) by mouth Two (2) times a day.   ??? metoprolol succinate (TOPROL-XL) 100 MG 24 hr tablet Take 1 tablet (100 mg total) by mouth daily.   ??? mycophenolate (CELLCEPT) 250 mg capsule Take 1 capsule (250 mg total) by mouth Two (2) times a day.   ??? omeprazole (PRILOSEC) 20 MG capsule Take 1 capsule (20 mg total) by mouth in the morning.   ??? predniSONE (DELTASONE) 5 MG tablet Take 1 tablet (5 mg total) by mouth daily.   ??? simethicone (GAS-X) 80 MG chewable tablet Chew 1 tablet (80 mg total) every six (6) hours as needed for flatulence.   ??? tacrolimus (PROGRAF) 1 MG capsule Take 3 capsules (3 mg total) by mouth every morning AND 2 capsules (2 mg total) nightly.       Past Medical History  Past Medical History:   Diagnosis Date   ??? Anemia    ??? CHF (congestive heart failure) (CMS-HCC)    ??? Coronary artery disease    ??? Disease  of thyroid gland    ??? GERD (gastroesophageal reflux disease)    ??? Gout    ??? Hyperlipidemia    ??? Hypertension    ??? Lupus nephritis (CMS-HCC)    ??? NSTEMI (non-ST elevated myocardial infarction) (CMS-HCC)    ??? Red blood cell antibody positive     anti-K   ??? Renal cancer, right (CMS-HCC)    ??? Renal transplant, status post        Past Surgical History  Past Surgical History:   Procedure Laterality Date   ??? AV FISTULA PLACEMENT Right    ??? HERNIA REPAIR     ??? PR BREATH HYDROGEN TEST N/A 06/24/2016    Procedure: BREATH HYDROGEN TEST;  Surgeon: Nurse-Based Giproc;  [facility];  Service: Gastroenterology   ??? PR COLONOSCOPY FLX DX W/COLLJ SPEC WHEN PFRMD N/A 05/22/2016    Procedure: COLONOSCOPY, FLEXIBLE, PROXIMAL TO SPLENIC FLEXURE; DIAGNOSTIC, W/WO COLLECTION SPECIMEN BY BRUSH OR WASH;  Surgeon: Liane Comber, MD;  Location: [facility];  Service: Gastroenterology   ??? PR COLSC FLX W/RMVL OF TUMOR POLYP LESION SNARE TQ N/A 02/04/2021    Procedure: COLONOSCOPY FLEX; W/REMOV TUMOR/LES BY SNARE;  Surgeon: Rona Ravens, MD;  Location: GI PROCEDURES [facility];  Service: Gastroenterology   ??? PR LIGATN ANGIOACCESS AV FISTULA Right 10/21/2018    Procedure: LIGATION OR BANDING OF ANGIOACCESS ARTERIOVENOUS FISTULA, UPPER EXTREMITY;  Surgeon: Leona Carry, MD;  Location: MAIN OR [facility];  Service: Transplant   ??? PR NEPHRECTOMY Right 02/20/2014    Procedure: LAPAROSCOPY, SURGICAL; NEPHRECTOMY, INCLUDING PARTIAL URETERECTOMY;  Surgeon: Vivi Barrack, MD;  Location: MA[facility] OR [facility];  Service: Transplant   ??? PR REVISE AV FISTULA,W/O THROMBECTOMY Right 02/07/2019    Procedure: REVISON, OPEN, ARTERIOVENOUS FISTULA; WITHOUT THROMBECTOMY, AUTOGENOUS OR NONAUTOGENOUS DIALYSIS GRAFT;  Surgeon: Leona Carry, MD;  Location: MAIN OR [facility];  Service: Transplant   ??? TRANSPLANTATION RENAL         Family History  Family History   Problem Relation Age of Onset   ??? Heart disease Mother    ??? Diabetes Mother    ??? Hypertension Mother    ??? Cancer Father    ??? Alcohol abuse Brother    ??? Cardiomyopathy Neg Hx    ??? Sudden Cardiac Death Neg Hx    ??? Melanoma Neg Hx    ??? Basal cell carcinoma Neg Hx    ??? Squamous cell carcinoma Neg Hx    ??? Anesthesia problems Neg Hx        Social History  Social History     Socioeconomic History   ??? Marital status: Married   Tobacco Use   ??? Smoking status: Never Smoker   ??? Smokeless tobacco: Never Used   Substance and Sexual Activity   ??? Alcohol use: No     Alcohol/week: 0.0 standard drinks   ??? Drug use: No   ??? Sexual activity: Not Currently   Other Topics Concern   ??? Do you use sunscreen? No   ??? Tanning bed use? No   ??? Are you easily burned? No   ??? Excessive sun exposure? No   ??? Blistering sunburns? No   Social History Narrative    02/11/18: The patient lives with his wife, his daughter, and his two grandchildren in [place], [place]. 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 No  history of [employer]. No smoking history. No reported alcohol history.            Objective     Vitals - past 24 hours  Temp:  [38 ??C (100.4 ??F)-39 ??C (102.2 ??F)] 38.6 ??C (101.5 ??F)  Heart Rate:  [101-143] 130  SpO2 Pulse:  [97-136] 122  Resp:  [18-26] 26  BP: (54-119)/(15-81) 89/35  A BP-1: (90-107)/(48-59) 104/58  FiO2 (%):  [50 %-100 %] 50 %  SpO2:  [84 %-96 %] 96 % Intake/Output  I/O last 3 completed shifts:  In: 794.1 [I.V.:194.1; IV Piggyback:600]  Out: 20 [Urine:20]     Physical Exam:   General: NAD, well nourished,   HEENT:  normocephalic, trachea mdl, anicteric, no scleral edema, no conjuctival erythema/drainage, MMM and pink   CV: RRR. S1 and S2 normal, no m/r/c/g. no bruit, or JVD. DP Pulses 2  equal. No BLE edema.  Lungs:  chest rise symm, diminished bases R>L. Scattered whonchi, no wheezes/crackles  Skin:   w/d/i, no jaundice present, no rashes, lesions, petechiae or breakdown. no drng, erythema.  RUE AVF +thrill  Abd: contour, abdomen soft, non-tender and not distended. Normoactive bowel sounds x 4 quads, no rebound tenderness or guarding.   Ext: No cyanosis, bruising, clubbing or edema.         The patient's hospital stay has been complicated by the following clinically significant conditions requiring additional evaluation and treatment or having a significant effect of this patient's care: - Anemia POA requiring further investigation or monitoring  - Chronic kidney disease POA requiring further investigation, treatment, or monitoring  - Acidosis POA requiring further investigation, treatment, or monitoring  - Fluid Overload POA requiring further investigation, treatment, or monitoring    Body mass index is 22.68 kg/m??.            Wt Readings from Last 12 Encounters:   09/01/2021 71.7 kg (158 lb 1.1 oz)   07/30/21 79.7 kg (175 lb 12.8 oz)   06/02/21 76.2 kg (168 lb)   05/08/21 75.9 kg (167 lb 4.8 oz)   02/04/21 74.8 kg (165 lb)   01/27/21 74.9 kg (165 lb 3.2 oz)   01/08/21 73.8 kg (162 lb 9.6 oz)   09/05/20 74.6 kg (164 lb 6.4 oz)   05/02/20 74.2 kg (163 lb 9.6 oz)   01/05/20 73.9 kg (163 lb)   11/30/19 74.4 kg (164 lb)   09/08/19 75.6 kg (166 lb 9.6 oz)       Continuous Infusions:   ??? fentaNYL citrate (PF) 50 mcg/mL infusion 50 mcg/hr (08/19/2021 1800)   ??? norepinephrine bitartrate-NS 20 mcg/min (08/19/2021 1756)   ??? vasopressin 0.03 Units/min (08/15/2021 1815)       Scheduled Medications:   ??? acetaminophen  1,000 mg Oral Q8H   ??? aspirin  81 mg Oral Daily   ??? atorvastatin  20 mg Oral Nightly   ??? [START ON 09/08/2021] calcitrioL  0.25 mcg Enteral tube: gastric  Q MWF   ??? [START ON 09/08/2021] Cefepime  1 g Intravenous Q24H   ??? dexAMETHasone  6 mg Oral Daily   ??? heparin (porcine) for subcutaneous use  5,000 Units Subcutaneous [facility]   ??? [START ON 09/08/2021] levothyroxine  250 mcg Enteral tube: gastric  daily   ??? magnesium oxide  400 mg Oral BID   ??? remdesivir  200 mg Intravenous Once    Followed by   ??? [START ON 09/08/2021] remdesivir  100 mg Intravenous Daily   ???  sodium bicarbonate  1,300 mg Oral QID   ??? white petrolatum-mineral oil  1 application Both Eyes BID       PRN medications: dextrose in water, glucagon, glucose      Data/Imaging Review: Reviewed in Epic and personally interpreted on 09-19-21. See EMR for detailed results.        Critical Care Attestation     This patient is critically ill or injured with the impairment of vital organ systems such that there is a high probability of imminent or life threatening deterioration in the patient's condition. This patient must remain in the ICU for ongoing evaluation of the comprehensive management plan outlined in this note. I directly provided critical care services as documented in this note and the critical care time spent (60  min) is exclusive of separately billable procedures.  In addition to time spent for critical care management, I also provided advance care planning services for 0  minutes (see ACP note for details)???. Total billable critical care time 60  minutes.    Rossie Scarfone Fonnie Mu, ACNP

## 2021-09-08 NOTE — Unmapped (Signed)
ED Procedure Note    Intubation    Date/Time: 08/30/2021 11:24 AM  Performed by: Magda Bernheim, MD  Authorized by: Rosie Fate, DO     Consent:     Consent obtained:  Emergent situation  Universal protocol:     Patient identity confirmed:  Arm band  Pre-procedure details:     NPO status caution: unable to specify NPO status      Patient status:  Unresponsive    Pretreatment meds: etomidate.    Paralytics:  Rocuronium  Procedure details:     Preoxygenation:  Nonrebreather mask    Intubation method:  Oral    Oral intubation technique:  Video-assisted    Laryngoscope size: hyperangulated blade.    Tube size (mm):  7.5    Tube type:  Cuffed    Number of attempts:  1    Tube visualized through cords: yes    Placement assessment:     ETT to lip:  24    Tube secured with:  ETT holder    Breath sounds:  Equal    Placement verification: chest rise, CXR verification, equal breath sounds and ETCO2 detector      CXR findings:  ETT in proper place  Post-procedure details:     Patient tolerance of procedure:  Tolerated well, no immediate complications

## 2021-09-08 NOTE — Unmapped (Signed)
MICU Evening Summary     Date of Service: 2021-09-15    Interval History: Miguel Ward is a 70 y.o. male with PMHx HTN, 2009 renal transplant (no longer on  iHD), CAD, HLD, HFrEF, NSTEMI, and lupus nephritis presented 9/25 for AMS. When EMS arrived, he was fully unresponsive, and hypoglycemic.  He was given 250 mL of D10, and became semi unconscious. He still only responded to pain and verbal, and was started on levo for SBP 90s. Upon arrival to ED he was intubated for airway protection and hypoxia. CXR c/f covid pna +/- superimposed bact infection in an immunocomp host.    Principal Problem:    Acute respiratory distress syndrome (ARDS) due to COVID-19 virus (CMS-HCC)  Active Problems:    Hypercholesterolemia    Hypertension    History of kidney transplant    Renal cell carcinoma (CMS-HCC)    Immunosuppression-related infectious disease (CMS-HCC)    Bilateral lower extremity edema    Elevated troponin I level    GERD (gastroesophageal reflux disease)    Diastolic heart failure (CMS-HCC)    Acute on chronic HFrEF (heart failure with reduced ejection fraction) (CMS-HCC)    COVID  Resolved Problems:    * No resolved hospital problems. *        Assessment & Plan     N: fent gtt, for RASS 0 to 1-  P: prvc, min support, DPs low teens on peep 8, wean for sats >94%  CV: levo/vaso for MAP>65, SDS, echo 2020 w EF 40-45%, phtn, dd3, no fluids given in ED, trop elevated and peaked at 3900, ekg afib no evid ACS, pending repeat echo, 2L IVF challenge given escalating pressor doses with desirable response  GI: miralax and ppi, hold TF for now  R: baseline Cr 3, consented for CRRT not indicated right now per renal  H: heparin per ACS protocol given afib/troponin leak and no contraindications  E: accu check to eval for hypoglycemia with D10% boluses PRN   ID: vanc/cefe, repeat covid PCR nares negative, PCR from tracheal aspirate pending, holding toci and home acyclovir for ppx  Immuno: hold cellcept/tacr    Critical Care Attestation     This patient is critically ill or injured with the impairment of vital organ systems such that there is a high probability of imminent or life threatening deterioration in the patient's condition. This patient must remain in the ICU for ongoing evaluation of the comprehensive management plan outlined in this note. I directly provided critical care services as documented in this note and the critical care time spent (50  min) is exclusive of separately billable procedures.  In addition to time spent for critical care management, I also provided advance care planning services for 0  minutes (see ACP note for details)???. Total billable critical care time 50  minutes.    Di Kindle Chanan Detwiler, ACNP

## 2021-09-08 NOTE — Unmapped (Signed)
RASS -4, sedated on versed and fentanyl, febrile, afib. VSS on norepinephrine and vaso. Vented, see vent settings in nursing flowsheet. OG tube clamped. Foley putting out minimal UO.   Problem: Fall Injury Risk  Goal: Absence of Fall and Fall-Related Injury  Outcome: Ongoing - Unchanged  Intervention: Promote Scientist, clinical (histocompatibility and immunogenetics) Documentation  Taken 09-15-21 1800 by Clement Sayres, RN  Safety Interventions: aspiration precautions     Problem: Infection  Goal: Absence of Infection Signs and Symptoms  Outcome: Ongoing - Unchanged  Intervention: Prevent or Manage Infection  Recent Flowsheet Documentation  Taken 09-15-21 1800 by Clement Sayres, RN  Infection Management: aseptic technique maintained  Isolation Precautions: spec airborne/contact precautions maintained     Problem: Adult Inpatient Plan of Care  Goal: Plan of Care Review  Outcome: Ongoing - Unchanged  Goal: Patient-Specific Goal (Individualized)  Outcome: Ongoing - Unchanged  Goal: Absence of Hospital-Acquired Illness or Injury  Outcome: Ongoing - Unchanged  Intervention: Identify and Manage Fall Risk  Recent Flowsheet Documentation  Taken 2021/09/15 1800 by Clement Sayres, RN  Safety Interventions: aspiration precautions  Goal: Optimal Comfort and Wellbeing  Outcome: Ongoing - Unchanged  Goal: Readiness for Transition of Care  Outcome: Ongoing - Unchanged  Goal: Rounds/Family Conference  Outcome: Ongoing - Unchanged     Problem: Skin Injury Risk Increased  Goal: Skin Health and Integrity  Outcome: Ongoing - Unchanged  Intervention: Optimize Skin Protection  Recent Flowsheet Documentation  Taken 2021/09/15 1800 by Clement Sayres, RN  Pressure Reduction Techniques: weight shift assistance provided  Head of Bed (HOB) Positioning: HOB at 30-45 degrees  Pressure Reduction Devices: pressure-redistributing mattress utilized

## 2021-09-08 NOTE — Unmapped (Signed)
Transplant Nephrology Follow-Up Consult note    Assessment/Recommendations: Miguel Ward is a 70 y.o. male  status post deceased donor kidney transplant on 06/11/2007 (Kidney) for native kidney disease secondary to lupus nephritis who has been admitted with AKI/sepsis.  ??  # Kidney allograft function: (unstable)  - serum creatinine level is elevated to 5.4   - most likely etiology is ATN secondary to sepsis and hypotension  - unable to examine urine sediment due to Covid infection  - Will initiate CRRT today for oligo-anuria, acidemia, and eventual fluid overload in context of known ARDS.    # Immunosuppression [High risk medical decision making for drug therapy requiring intensive monitoring for toxicity]  - Restart Tac 1/1  ??  # Covid infection/pneumonia/sepsis  - agree with antibiotics, remdesivir as you are  - cultures pending  - getting stress dose steroids  ??  # Metabolic acidosis  - agree with sodium bicarbonate supplementation    Recommendations were discussed with primary service.    Elmer Picker  Division of Nephrology and Hypertension  [facility]  09/08/2021    ___________________________________________________________    Subjective: Unable to provide- intubated, sedated. Poor Urine output in last 24 hrs, about 25cc.    Medications:   Current Facility-Administered Medications   Medication Dose Route Frequency Provider Last Rate Last Admin   ??? acetaminophen (TYLENOL) tablet 1,000 mg  1,000 mg Enteral tube: gastric  Q8H Amy Ross Vota, PA   1,000 mg at 09/08/21 1051   ??? aspirin chewable tablet 81 mg  81 mg Enteral tube: gastric  Daily Amy Ladon Applebaum, PA   81 mg at 09/08/21 1610   ??? atorvastatin (LIPITOR) tablet 20 mg  20 mg Enteral tube: gastric  Nightly Amy [employer], PA       ??? calcitrioL (ROCALTROL) capsule 0.25 mcg  0.25 mcg Enteral tube: gastric  Q MWF Honey Monet Gildardo Cranker, ACNP   0.25 mcg at 09/08/21 9604   ??? cefepime (MAXIPIME) 1 g in sodium chloride 0.9 % (NS) 100 mL IVPB-connector bag  1 g Intravenous Q24H Honey Monet Gildardo Cranker, ACNP       ??? chlorhexidine (PERIDEX) 0.12 % solution 5 mL  5 mL Mouth BID Amy Ross Vota, PA   5 mL at 09/08/21 0835   ??? dextrose (D10W) 10% bolus 125 mL  12.5 g Intravenous Q10 Min PRN Honey Lynnae January, ACNP   125 mL at 09/08/21 0521   ??? dextrose 10 % infusion  50 mL/hr Intravenous Continuous Modena Slater Dehoux, ACNP 50 mL/hr at 09/08/21 1000 50 mL/hr at 09/08/21 1000   ??? [START ON 09/09/2021] esomeprazole (NexIUM) granules 40 mg  40 mg Enteral tube: gastric  daily Amy Ladon Applebaum, PA       ??? fentaNYL PF (SUBLIMAZE) (50 mcg/mL) infusion (bag)  0-200 mcg/hr Intravenous Continuous Rosie Fate, DO 1 mL/hr at 09/08/21 1000 50 mcg/hr at 09/08/21 1000   ??? heparin (porcine) 1000 unit/mL injection 2,000 Units  2,000 Units Intravenous Q6H PRN Modena Slater Dehoux, ACNP       ??? heparin 25,000 Units/250 mL (100 units/mL) in 0.45% saline infusion (premade)  12 Units/kg/hr Intravenous Continuous Modena Slater Dehoux, ACNP 8.6 mL/hr at 09/08/21 1000 12 Units/kg/hr at 09/08/21 1000   ??? hydrocortisone sod succ (Solu-CORTEF) injection 50 mg  50 mg Intravenous Q6H SCH Honey Monet Gildardo Cranker, ACNP   50 mg at 09/08/21 1104   ??? influenza vaccine quad (FLUARIX, FLULAVAL, FLUZONE) (6 MOS &  UP) 2022-23  0.5 mL Intramuscular During hospitalization Rayetta Pigg, MD       ??? levothyroxine (SYNTHROID) tablet 250 mcg  250 mcg Enteral tube: gastric  daily Honey Lynnae January, ACNP   250 mcg at 09/08/21 0526   ??? magnesium oxide (MAG-OX) tablet 400 mg  400 mg Enteral tube: gastric  BID Amy Ross Vota, PA   400 mg at 09/08/21 1610   ??? norepinephrine 8 mg in dextrose 5 % 250 mL (32 mcg/mL) infusion PMB  0-30 mcg/min Intravenous Continuous Rosie Fate, DO 22.5 mL/hr at 09/08/21 1000 12 mcg/min at 09/08/21 1000   ??? polyethylene glycol (MIRALAX) packet 17 g  17 g Enteral tube: gastric  Daily Amy [employer], PA       ??? sodium bicarbonate tablet 1,300 mg  1,300 mg Enteral tube: gastric  QID Amy Ladon Applebaum, PA       ??? tacrolimus (PROGRAF) oral suspension  1 mg Enteral tube: gastric  BID Amy Ladon Applebaum, PA       ??? Vancomycin - Pharmacy dosing by levels   Other Pharmacy dosing Rayetta Pigg, MD       ??? vasopressin 20 units in 100 mL (0.2 units/mL) infusion premade vial  0.03 Units/min Intravenous Continuous Honey Monet Gildardo Cranker, ACNP 9 mL/hr at 09/08/21 1000 0.03 Units/min at 09/08/21 1000   ??? white petrolatum-mineral oil (SOOTHE PM) 80-20 % ophthalmic ointment 1 application  1 application Both Eyes BID Honey Monet Gildardo Cranker, ACNP   1 application at 09/08/21 0909      Physical Exam:   Vitals:    09/08/21 1100   BP:    Pulse: 81   Resp: 26   Temp: 37 ??C (98.6 ??F)   SpO2:      I/O this shift:  In: 455.7 [I.V.:395.7; NG/GT:60]  Out: -     Intake/Output Summary (Last 24 hours) at 09/08/2021 1200  Last data filed at 09/08/2021 1000  Gross per 24 hour   Intake 3508.36 ml   Output 25 ml   Net 3483.36 ml     General: Intubated, sedated  HEENT: ETT in place  CV: Levo coming down, remains on vaso  Lungs: Mechanically ventilated  Neuro: sedated    Test Results  Reviewed  Lab Results   Component Value Date    NA 136 09/08/2021    K 5.2 (H) 09/08/2021    CL 107 09/08/2021    CO2 13.0 (L) 09/08/2021    BUN 90 (H) 09/08/2021    CREATININE 5.40 (H) 09/08/2021    GFR 44.94 (L) 02/28/2013    GLU 118 09/08/2021    CALCIUM 8.8 09/08/2021    ALBUMIN 2.2 (L) 09/08/2021    PHOS 4.6 08/28/2021

## 2021-09-08 NOTE — Unmapped (Signed)
Pt off of versed; RASS -5. Remains sedated on fentanyl. Bilateral soft wrist restraints applied to avoid pulling at lines/tubes.   Problem: Fall Injury Risk  Goal: Absence of Fall and Fall-Related Injury  Outcome: Ongoing - Unchanged  Intervention: Promote Injury-Free Environment  Recent Flowsheet Documentation  Taken 09-13-2021 2000 by Wanda Plump, RN  Safety Interventions:   aspiration precautions   fall reduction program maintained   bed alarm   bleeding precautions     Problem: Infection  Goal: Absence of Infection Signs and Symptoms  Outcome: Ongoing - Unchanged  Intervention: Prevent or Manage Infection  Recent Flowsheet Documentation  Taken 09-13-21 2000 by Wanda Plump, RN  Infection Management: aseptic technique maintained  Isolation Precautions: spec airborne/contact precautions maintained     Problem: Adult Inpatient Plan of Care  Goal: Plan of Care Review  Outcome: Ongoing - Unchanged  Goal: Patient-Specific Goal (Individualized)  Outcome: Ongoing - Unchanged  Goal: Absence of Hospital-Acquired Illness or Injury  Outcome: Ongoing - Unchanged  Intervention: Identify and Manage Fall Risk  Recent Flowsheet Documentation  Taken 09-13-21 2000 by Wanda Plump, RN  Safety Interventions:   aspiration precautions   fall reduction program maintained   bed alarm   bleeding precautions  Intervention: Prevent Skin Injury  Recent Flowsheet Documentation  Taken September 13, 2021 2000 by Wanda Plump, RN  Skin Protection: adhesive use limited  Intervention: Prevent and Manage VTE (Venous Thromboembolism) Risk  Recent Flowsheet Documentation  Taken Sep 13, 2021 2000 by Wanda Plump, RN  Activity Management: bedrest  Range of Motion: Bilateral Upper and Lower Extremities  Goal: Optimal Comfort and Wellbeing  Outcome: Ongoing - Unchanged  Goal: Readiness for Transition of Care  Outcome: Ongoing - Unchanged  Goal: Rounds/Family Conference  Outcome: Ongoing - Unchanged     Problem: Skin Injury Risk Increased  Goal: Skin Health and Integrity  Outcome: Ongoing - Unchanged  Intervention: Optimize Skin Protection  Recent Flowsheet Documentation  Taken 09-13-21 2000 by Wanda Plump, RN  Pressure Reduction Techniques: frequent weight shift encouraged  Head of Bed (HOB) Positioning: HOB at 30-45 degrees  Pressure Reduction Devices: heel offloading device utilized  Skin Protection: adhesive use limited     Problem: Dysrhythmia (Heart Failure)  Goal: Stable Heart Rate and Rhythm  Outcome: Ongoing - Unchanged     Problem: Fluid Imbalance (Heart Failure)  Goal: Fluid Balance  Outcome: Ongoing - Unchanged     Problem: Respiratory Compromise (Heart Failure)  Goal: Effective Oxygenation and Ventilation  Outcome: Ongoing - Unchanged     Problem: Device-Related Complication Risk (Mechanical Ventilation, Invasive)  Goal: Optimal Device Function  Outcome: Ongoing - Unchanged  Intervention: Optimize Device Care and Function  Recent Flowsheet Documentation  Taken September 13, 2021 2000 by Wanda Plump, RN  Aspiration Precautions: respiratory status monitored     Problem: Nutrition Impairment (Mechanical Ventilation, Invasive)  Goal: Optimal Nutrition Delivery  Outcome: Ongoing - Unchanged     Problem: Non-Violent Restraints  Goal: Patient will remain free of restraint events  Outcome: Ongoing - Unchanged  Goal: Patient will remain free of physical injury  Outcome: Ongoing - Unchanged     Problem: Impaired Wound Healing  Goal: Optimal Wound Healing  Outcome: Ongoing - Unchanged  Intervention: Promote Wound Healing  Recent Flowsheet Documentation  Taken 13-Sep-2021 2000 by Wanda Plump, RN  Activity Management: bedrest     Problem: Self-Care Deficit  Goal: Improved Ability to Complete Activities of Daily Living  Outcome: Ongoing - Unchanged   A  fib; heparin drip started. MAP > 65 maintained with levo & vaso. 2 L LR given. PRVC 40%. Moderate bloody secretions overnight. Provider aware; no interventions. No BM. Minimal urine output overnight. Blood sugar in the 50s, 1 L D5 given.

## 2021-09-08 NOTE — Unmapped (Signed)
Respiratory Safety Check    Date: 09/08/2021     PPV Device at bedside:   [x]  Yes  []  Replaced  []  N/A    PPV mask at bedside:  [x]  Yes  []  Replaced  []  N/A    Checked humidifier, inspiratory limb, and expiratory limb:   [x]  Yes  []  Replaced  []  N/A    Backup trachs and supplies available at bedside:  []  Yes  []  Replaced  []  N/A    Documented trach airway form visible at bedside:   []  Yes  []  Replaced  []  N/A    iNO connected with PPV device:  []  Yes  []  Replaced  []  N/A

## 2021-09-08 NOTE — Unmapped (Signed)
MICU Daily Progress Note     Date of Service: 09/08/2021    Problem List:   Principal Problem:    Acute respiratory distress syndrome (ARDS) due to COVID-19 virus (CMS-HCC)  Active Problems:    Hypercholesterolemia    Hypertension    History of kidney transplant    Renal cell carcinoma (CMS-HCC)    Immunosuppression-related infectious disease (CMS-HCC)    Bilateral lower extremity edema    Elevated troponin I level    GERD (gastroesophageal reflux disease)    Diastolic heart failure (CMS-HCC)    Acute on chronic HFrEF (heart failure with reduced ejection fraction) (CMS-HCC)    COVID  Resolved Problems:    * No resolved hospital problems. *      Interval history: ARISH REDNER is a 70 y.o. male renal transplant from lupus nephritis (complex course over last 15 years since transplant followed here including renal cell) here with acute hypoxic respiratory failure, COVID 19 and shock with AKI.      Neurological   #acute metabolic encephalopathy  - neuro checks: q4h  - analegsia prn  - fent gtt for vent compliance and comfort >> markedly sedate on arrival, wean for RASS 0 to 1-  - spontaneous awakening trial daily  - CAM ICU delirium present: yes  - PT/OT for early mobility as tol when approp    Pulmonary   #ARDS, Acute resp failure   - tolerating prvc well, min settings w DP low teens  - cont lung protective ventilation strategies (46ml/kg, permissive hypercapnea)  - cont wean vent as tol per ARDS protocol  - sedation for vent compliance as above w lacrilube OU bid  - goal sats >94%, pao2 >60 pco2, 45-60 , pH>7.25  - pulm toilet as tol, HOB>30deg  - peridex for vap ppx   - Fluid sparing resuscitation give known heart failure and Phtn  - no indication for proning at this time    Cardiovascular   #septic shock, elevated trop  #HFrEF, ho htn, hld, cad  - arrived from ED on levo 26,   - MAP goals >65  - cont levo/vaso for MAP goals,  - POCUS suggests plehtoric IVC, dilated and dysfxnal RV w low EF  - formal echo pending  - cont home regimen asa statin  - hold home regimen metop, vasotec, lasix  - fem line in place unable to trend scvo2/cvp  - start SDS    Renal   #AKI (baseline Cr 2.7-3), AGMA  #hx renal transplant s/t lupus nephritis, c/b rejection and renal cell CA s/p cryo ablation  - foley for accurate I/O during critical illness   - goal euvolemia   - replete electrolytes prn and monitor daily  - cont home regimen magox  - hold home regimen lasix  - started bicarb enteral for acidosis  - f/u transplant recs >> consented for CRRT but not yet indicated, hold cellcept/tacro  - worsening renal fxn, plan to start CRRT, line placed 9/26    Infectious Disease/Autoimmune   #COVID 19 infection  Symptom onset:??9/22  Exposure: unknown  COVID PCR+: pending (home test positive 9/23?)  Day of admission/transfer: 9/25  COVID Specific??Meds:??dec/rem  Vaccination status: full & boosted x once, evushield given 8/17  ??  Monitoring:  - Special airborne/contact precautions (If unavailable, droplet & contact precautions)  - f/u daily CMP, CBC w/ diff, CRP, D-dimer, DIC, troponin, pro-BNP with weekly ferritin, LDH and cytokine level  - f/u q4h lactate; ABG  - f/u CXR  ??  Medications:  - Tylenol q8  - hold all NSAIDs, may cont home regimen ACEi, ARB, & thiazolinediones   - pending repeat PCR before starting remdesivir, if started hold if (1) ALT >=5x ULN or (2) s/s liver inflammation or (3) elevated ALT w inc indirect Bili, alkphos or INR  - decadron 6mg  IV daily x 10d, started 9/25   - Toci/bari not given since on cellcept/tacro and now w AKI  ??  #CAP  - febrile, leukocytosis   - cont abx regimen vanc/cefe  - pan cx for T>38.2   - f/u cx data pending, f/u fungitel, crypto and resp cx  - hold home regimen acyclovir for ppx  - ICID consulted    Cultures:  No results found for: BLOOD CULTURE, URINE CULTURE, LOWER RESPIRATORY CULTURE  WBC (10*9/L)   Date Value   09/08/2021 13.1 (H)     WBC, UA (/HPF)   Date Value   09/05/2021 <1 FEN/GI   #gerd  - started TF, adv to goal as tol  - nutrition consult pending  - hold Tf for now since on high dose pressor  - ppi for gi ppx  - bowel regimen w miralax    Malnutrition Assessment: Not done yet.  Body mass index is 22.68 kg/m??.            Heme/Coag   #Hypercoagulable state r/t COVID  - HH, plts, coags acceptable  - no evid active bleeding  - SCDs for DVT ppx  - cont prophylactic dosing heparin    Endocrine   #hypoglycemic, hypothryoid  - glucose within target range yes  - cont ADI + accu checks   - hold home regimen allopurinol  - cont home regimen synthroid    Integumentary     #  - WOCN consulted for high risk skin assessment No. Reason: no alterations in skin.  - WOCN recs >> pending , agree with assmt and plan   - cont pressure mitigating precautions per skin policy    Prophylaxis/LDA/Restraints/Consults   Can CVC be removed? No: dialysis catheter   Can A-line be removed? No: >moderate dose pressors  Can Foley be removed? No: Need continuous I/O  Mobility plan: Step 1 - Range of motion    Feeding: Trickle feeds, advance as tolerated  Analgesia: Pain adequately controlled  Sedation SAT/SBT: Yes  Thromboembolic ppx: SQ heparin  Head of bed >30 degrees: Yes  Ulcer ppx: Yes, home use continued  Glucose within target range: Yes, in range    Does patient need/have an active type/screen? Yes    RASS at goal? No - adjusting sedation or order to reflect need  [place] Agitation Assessment Scale (RASS) : -5 (09/08/2021 10:00 AM)     Can antipsychotics be stopped? N/A, not on antipsychotics  CAM-ICU Result: Positive (09/08/2021  8:00 AM)      Would hospice care be appropriate for this patient? No, patient improving or expected to improve  Any unaddressed hospice/palliative care needs? no    Patient Lines/Drains/Airways Status     Active Active Lines, Drains, & Airways     Name Placement date Placement time Site Days    ETT  7.5 09/05/2021  1337  -- less than 1    CVC Triple Lumen 09/01/2021 Right Femoral 08/28/2021  1514  Femoral  less than 1    NG/OG Tube Decompression 16 Fr. Center mouth 09/09/2021  1500  Center mouth  less than 1    Urethral Catheter Temperature probe 16 Fr. 09/10/2021  1602  Temperature probe  less than 1    Peripheral IV 10-Sep-2021 Left Antecubital 09-10-2021  --  Antecubital  1    Arterial Line 09/10/2021 Left Brachial 09-10-2021  1511  Brachial  less than 1    Arteriovenous Fistula - Vein Graft  Access 11/01/18 Right;Upper Arm 11/01/18  --  Arm  1042              Patient Lines/Drains/Airways Status     Active Wounds     Name Placement date Placement time Site Days    Surgical Site 10/21/18 Arm Right 10/21/18  0926  -- 1053    Surgical Site 02/07/19 Arm Right 02/07/19  0854  -- 944                Goals of Care     Code Status: Full Code    Designated Healthcare Decision Maker:  Mr. Holsopple current decisional capacity for healthcare decision-making is Full capacity. His designated healthcare decision maker(s) is/are   HCDM (patient stated preference): Kramme,Katherine - Spouse - [phone].      Subjective     A 70 yo male admitted after found unresponsive and hypoglycemic who became more alert after glucose but not returning to known baseline.  Was intubated in ED to protect airway and transferred to [facility].      Objective     Vitals - past 24 hours  Temp:  [36.9 ??C (98.4 ??F)-38.7 ??C (101.7 ??F)] 37 ??C (98.6 ??F)  Heart Rate:  [76-143] 81  SpO2 Pulse:  [72-136] 80  Resp:  [16-26] 26  BP: (54-119)/(15-81) 89/35  A BP-1: (90-107)/(48-59) 104/58  FiO2 (%):  [40 %-100 %] 40 %  SpO2:  [91 %-99 %] 97 % Intake/Output  I/O last 3 completed shifts:  In: 3052.7 [I.V.:951.4; NG/GT:225; IV Piggyback:1876.3]  Out: 25 [Urine:25]     Physical Exam:    General: Generally ill appearing  HEENT: Scleral jaundice/edema, round normocephalic  CV: SR-ST with PACs  Pulm: diminished on right  GI: abd round, non tender  MSK: grossly intact, tone intact  Skin: grossly intact, warm  Neuro: withdraws to stimulus, pupils equal round reactive      Continuous Infusions:   ??? dextrose 50 mL/hr (09/08/21 1200)   ??? fentaNYL citrate (PF) 50 mcg/mL infusion 50 mcg/hr (09/08/21 1200)   ??? heparin 12 Units/kg/hr (09/08/21 1200)   ??? norepinephrine bitartrate-NS 12 mcg/min (09/08/21 1200)   ??? vasopressin 0.03 Units/min (09/08/21 1200)       Scheduled Medications:   ??? acetaminophen  1,000 mg Enteral tube: gastric  Q8H   ??? aspirin  81 mg Enteral tube: gastric  Daily   ??? atorvastatin  20 mg Enteral tube: gastric  Nightly   ??? calcitrioL  0.25 mcg Enteral tube: gastric  Q MWF   ??? Cefepime  1 g Intravenous Q24H   ??? chlorhexidine  5 mL Mouth BID   ??? [START ON 09/09/2021] esomeprazole  40 mg Enteral tube: gastric  daily   ??? hydrocortisone sod succ  50 mg Intravenous Q6H SCH   ??? flu vacc qs2022-23 6mos up(PF)  0.5 mL Intramuscular During hospitalization   ??? levothyroxine  250 mcg Enteral tube: gastric  daily   ??? magnesium oxide  400 mg Enteral tube: gastric  BID   ??? polyethylene glycol  17 g Enteral tube: gastric  Daily   ??? sodium bicarbonate  1,300 mg Enteral tube: gastric  QID   ??? Tacrolimus  1 mg Enteral  tube: gastric  BID   ??? Vancomycin - Pharmacy dosing by levels   Other Pharmacy dosing   ??? white petrolatum-mineral oil  1 application Both Eyes BID       PRN medications:  dextrose in water, heparin (porcine)    Data/Imaging Review: Reviewed in Epic and personally interpreted on 09/08/2021. See EMR for detailed results.       Critical Care Attestation     This patient is critically ill or injured with the impairment of vital organ systems such that there is a high probability of imminent or life threatening deterioration in the patient's condition. This patient must remain in the ICU for ongoing evaluation of the comprehensive management plan outlined in this note. I directly provided critical care services as documented in this note and the critical care time spent (55  min) is exclusive of separately billable procedures.  In addition to time spent for critical care management, I also provided advance care planning services for 0  minutes (see ACP note for details)???. Total billable critical care time 55  minutes.    Khalifa Knecht Willow Ora, ACNP

## 2021-09-08 NOTE — Unmapped (Signed)
Central Venous Catheter Insertion Procedure Note     Date of Service: 09/08/2021    Patient Name: Miguel Ward  Patient MRN: [id number]    Line type:  Trialysis Hemodialysis Catheter    Indications:  Hemodialysis    Procedure Details:   Informed consent was obtained after explanation of the risks and benefits of the procedure, refer to the consent documentation.    Time-out was performed immediately prior to the procedure.    The right internal jugular vein was identified using bedside ultrasound. This area was prepped and draped in the usual sterile fashion. Maximum sterile technique was used including antiseptics, cap, gloves, gown, hand hygiene, mask, and sterile sheet.  The patient was placed in Trendelenburg position. Local anesthesia with 1% lidocaine was applied subcutaneously then deep to the skin. The introducer was then inserted into the internal jugular vein using ultrasound guidance.    Using the Seldinger Technique a Hemodialysis Catheter was placed with each port easily flushed and freely drawing venous blood.    The catheter was secured with sutures. A sterile CHG drsg was applied to the site.    Condition:  The patient tolerated the procedure well and remains in the same condition as pre-procedure.    Complications:  None; patient tolerated the procedure well.    Plan:  CXR confirmed appropriate placement of central line. CVAD order placed OK to use     Natha Guin Willow Ora, ACNP

## 2021-09-08 NOTE — Unmapped (Signed)
NEW IMMUNOCOMPROMISED HOST INFECTIOUS DISEASE CONSULT NOTE      Miguel Ward is being seen in consultation at the request of Rayetta Pigg, * for evaluation of multifocal pneumonia.      Assessment/Recommendations:    Miguel Ward is a 70 y.o. 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 COVID tests negative x 2 here. Imaging and severity suggests alternative infection vs. superinfection, and thus patient started on broad-spectrum coverage with vancomycin/cefepime already with some improvement. Found to be legionella antigen positive and starting on specific therapy, pending workup for other infections in immunocompromised patient. Overall hemodynamic and respiratory status generally improving since admission.    ID Problem List:  ESRD 2/2 Lupus nephritis s/p deceased donor kidney transplant 05/2007  - Surgical complications: None  - Serologies: CMV D+/R-  - Immunosuppression: TAC 3/2, MMF 250, pred 5  - Prophylaxis prior to admission: acyclovir (hx HSV)  - Rejection history: Early cellular s/p ATG 2008      Pertinent Co-morbidities  SLE with lupus nephritis  CKDIV  RCC R native kidney s/p nephrectomy 02/2014  Suspected RCC transplanted kidney 12/2020, sp percutaneous cryoablation 06/2021  HFmREF (40-45% 06/2019)  CAD  HTN      Pertinent Exposure History  Occasionally mows lawn and works on car in garage, no insect/tick exposures  No animal exposures  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 Legionella Ag positive, pt also at risk for other bacterial infection or other pathogens (fungi, PJP, CMV)  - 09/05/21 SARS2 positive at home rapid antigen test  - 09/10/21 found at home minimally responsive, brought to ED, intubated  - 2021-09-10 NP and tracheal specimens negative for SARS2  - 09/08/21 urine legionella antigen positive  Tx: Vancomycin/cefepime/remdesivir 9/25 -> added levofloxacin 9/26    Prior infections:  #Fever and leukocytosis, possible UTI 08/2016  #Liver lesions suspected to be abscesses  - Suspected UTI, improved on empiric broad spectrum therapy Vanc/CFP -> doxy  - CT new liver lesions, FDG avid on PET, concerning for metastases  - Liver biopsy 09/2016 nonspecific inflammatory changes suggesting resolving abscess  - MRI 01/2017 improved liver lesions    #RSV infection 02/2018  - Also diagnosed with decompensated diastolic and systolic HF    Antimicrobial Intolerance/allergy  Penicillins - hives (2014), tolerated cephalosporins         RECOMMENDATIONS    Diagnostic  Please obtain respiratory pathogen panel (RPP)  Please obtain  Serum CMV PCR  Urine histoplasma Ag  Follow up Bcx 9/25  Follow up respiratory cultures 9/25 and MRSA nares  Will see if legionella PCR can be added onto respiratory culture 9/25  Follow up fungitell and CrAg  If further diarrhea, please send C. Diff and GI pathogen panel  If pursuing bronchoscopy, please send the following studies   - Aerobic/anaerobic cultures   - Fungal cultures   - AFB cultures    - CMV PCR   - EBV PCR   - Cytology   -  PJP DFA   - Galactomannan (aspergillus Ag)   - Legionella PCR   - Mycoplasma PCR  For eventual workup of chronic cough please obtain CT chest    Monitoring for antimicrobial toxicities  CBC w/ diff and CMP at least weekly  Repeat EKG after starting quinolone therapy    Treatment    Multifocal pneumonia, Legionella antigen positive  Continue vancomycin, cefepime  START Levofloxacin, 750 mg x 1 followed by CRRT dosing equivalent which is 500 mg daily    COVID-19 ag positive (reportedly)  If SARS2  infection confirmed, can continue remdesivir      Immunosuppression and prophylaxis  IS deferred to transplant team, prefer to continue tacrolimus if SARS-2 positive  Cont ppx with acyclovir             The [facility] ID service will continue to follow.  Please page the ID Transplant/Liquid Oncology Fellow consult at [phone] with questions.  Patient discussed with Dr. Raylene Miyamoto.      Rolly Pancake, MD  Infectious Disease Fellow  [place] [facility] [place] [facility]        History of Present Illness:      Source of information includes:  [employer] Records and Call to wife .  History obtained from:family member.    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 ID consulted to assist in diagnosis and management.    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 2 weeks ago noted diarrhea, up to 3-4 times daily and associated with occasional incontinence, no blood or mucous reported. Has had decreased appetite x 4-5 days. Ultimately wife insisted on COVID antigen test that was positive Saturday 9/23. They notified their physician and were planning on specific treatment this week. Pt was also reporting poor energy, worsened cough but non-productive, and cold intolerance, however no fevers, shortness of breath, chest pain, headaches, rashes. Sunday morning was noted to be doing well prior to wife leaving for day, however pt's daughter was unable to reach him and grand-daughter eventually went to home and found pt nearly unresponsive. Found to be hypoglycemic and hypoxic, improved with glucose and brought to ED. Intubated in ED, started on norepi -> vaso for hypotension as well. Since admission, O2 and pressor requirements with slow improvements.    Allergies:  Allergies   Allergen Reactions    Penicillins Hives     Other reaction(s): HIVES       Medications:   Antimicrobials:  Anti-infectives (From admission, onward)      Start     Dose/Rate Route Frequency Ordered Stop    09/08/21 1500  cefepime (MAXIPIME) 1 g in sodium chloride 0.9 % (NS) 100 mL IVPB-connector bag         1 g  200 mL/hr over 30 Minutes Intravenous Every 24 hours 09/09/2021 1723 09/13/21 1459    09/08/21 0000  remdesivir (VEKLURY) 100 mg in sodium chloride (NS) 0.9 % 295 mL IVPB        Question Answer Comment   Lab confirmed SARS-CoV-2 infection? Yes  Does patient have symptoms consistent with SARS-CoV-2 infection? Yes    Hospitalization less than or equal to 10 days for COVID-19? Yes       Followed by Linked Group Details    100 mg  590 mL/hr over 30 Minutes Intravenous Daily (standard) 08/17/2021 1723 09/12/21 0859    08/30/2021 2125  Vancomycin - Pharmacy dosing by levels          Other Pharmacy Dosing 09/12/2021 2125      08/21/2021 1800  remdesivir (VEKLURY) 200 mg in sodium chloride (NS) 0.9 % 315 mL IVPB        Question Answer Comment   Lab confirmed SARS-CoV-2 infection? Yes    Does patient have symptoms consistent with SARS-CoV-2 infection? Yes    Hospitalization less than or equal to 10 days for COVID-19? No       Followed by Linked Group Details    200 mg  630 mL/hr over 30 Minutes Intravenous Once 08/18/2021 1723              Current/Prior immunomodulators:  Tacrolimus 3/2  MMF 250 bid  Prednisone 5    Other medications reviewed.     Medical History:  Past Medical History:   Diagnosis Date    Anemia     CHF (congestive heart failure) (CMS-HCC)     Coronary artery disease     Disease of thyroid gland     GERD (gastroesophageal reflux disease)     Gout     Hyperlipidemia     Hypertension     Lupus nephritis (CMS-HCC)     NSTEMI (non-ST elevated myocardial infarction) (CMS-HCC)     Red blood cell antibody positive     anti-K    Renal cancer, right (CMS-HCC)     Renal transplant, status post        Surgical History:  Past Surgical History:   Procedure Laterality Date    AV FISTULA PLACEMENT Right     HERNIA REPAIR      PR BREATH HYDROGEN TEST N/A 06/24/2016    Procedure: BREATH HYDROGEN TEST;  Surgeon: Nurse-Based Giproc;  [facility];  Service: Gastroenterology    PR COLONOSCOPY FLX DX W/COLLJ SPEC WHEN PFRMD N/A 05/22/2016    Procedure: COLONOSCOPY, FLEXIBLE, PROXIMAL TO SPLENIC FLEXURE; DIAGNOSTIC, W/WO COLLECTION SPECIMEN BY BRUSH OR WASH;  Surgeon: Liane Comber, MD;  Location: [facility];  Service: Gastroenterology    PR COLSC FLX W/RMVL OF TUMOR POLYP LESION SNARE TQ N/A 02/04/2021    Procedure: COLONOSCOPY FLEX; W/REMOV TUMOR/LES BY SNARE;  Surgeon: Rona Ravens, MD;  Location: GI PROCEDURES [facility];  Service: Gastroenterology    PR LIGATN ANGIOACCESS AV FISTULA Right 10/21/2018    Procedure: LIGATION OR BANDING OF ANGIOACCESS ARTERIOVENOUS FISTULA, UPPER EXTREMITY;  Surgeon: Leona Carry, MD;  Location: MAIN OR [facility];  Service: Transplant    PR NEPHRECTOMY Right 02/20/2014    Procedure: LAPAROSCOPY, SURGICAL; NEPHRECTOMY, INCLUDING PARTIAL URETERECTOMY;  Surgeon: Vivi Barrack, MD;  Location: MAIN OR [place];  Service: Transplant    PR REVISE AV FISTULA,W/O THROMBECTOMY Right 02/07/2019    Procedure: REVISON, OPEN, ARTERIOVENOUS FISTULA; WITHOUT THROMBECTOMY, AUTOGENOUS OR NONAUTOGENOUS DIALYSIS GRAFT;  Surgeon: Leona Carry, MD;  Location: MA[facility] OR [facility] [place];  Service: Transplant    TRANSPLANTATION RENAL         I reviewed the medical and surgical history    Social History:  Tobacco use:   reports that he has never smoked.  He has never used smokeless tobacco.   Alcohol use:    reports no history of alcohol use.   Drug use:    reports no history of drug use.   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   Pets and animal exposure:  No animal exposure   Insect exposure:  No tick exposure   Hobbies:  Works on car, Financial risk analyst   TB exposures:  No known TB exposure   Sexual history:  Sex with women   Other significant exposures:  No exposure to well water and No exposure to unpasteurized daily products     Family History:  no recent sick contacts in family and no history active TB in a family member  Family History   Problem Relation Age of Onset    Heart disease Mother     Diabetes Mother     Hypertension Mother     Cancer Father     Alcohol abuse Brother     Cardiomyopathy Neg Hx     Sudden Cardiac Death Neg Hx     Melanoma Neg Hx     Basal cell carcinoma Neg Hx     Squamous cell carcinoma Neg Hx     Anesthesia problems Neg Hx        Review of Systems:  All other systems reviewed are negative.          Vital Signs last 24 hours:  Temp:  [36.9 ??C (98.4 ??F)-39 ??C (102.2 ??F)] 36.9 ??C (98.4 ??F)  Heart Rate:  [81-143] 81  SpO2 Pulse:  [81-136] 81  Resp:  [18-26] 26  BP: (54-119)/(15-81) 89/35  MAP (mmHg):  [28-93] 52  A BP-1: (90-107)/(48-59) 104/58  MAP:  [63 mmHg-75 mmHg] 73 mmHg  A BP-2: (82-117)/(42-64) 105/54  MAP:  [55 mmHg-83 mmHg] 72 mmHg  FiO2 (%):  [40 %-100 %] 40 %  SpO2:  [84 %-99 %] 98 %  BMI (Calculated):  [22.68] 22.68    Physical Exam:   Patient Lines/Drains/Airways Status       Active Active Lines, Drains, & Airways       Name Placement date Placement time Site Days    ETT  7.5 10/04/2021  1337  -- less than 1    CVC Triple Lumen 10/04/2021 Right Femoral 09/28/2021  1514  Femoral  less than 1    NG/OG Tube Decompression 16 Fr. Center mouth 09/13/2021  1500  Center mouth  less than 1    Urethral Catheter Temperature probe 16 Fr. September 23, 2021  1602  Temperature probe  less than 1    Peripheral IV 10/03/2021 Left Antecubital 09/17/2021  --  Antecubital  1    Arterial Line Sep 23, 2021 Left Brachial 10/08/2021  1511  Brachial  less than 1    Arteriovenous Fistula - Vein Graft  Access 11/01/18 Right;Upper Arm 11/01/18  --  Arm  1042                  Const [x]  vital signs above    []  NAD, non-toxic appearance        Eyes []   Lids normal bilaterally, conjunctiva anicteric and noninjected OU     [] PERRL  [] EOMI  Lids swollen, pupils sluggish      ENMT [x]  Normal appearance of external nose and ears, no nasal discharge        []  MMM, no lesions on lips or gums []  No thrush, leukoplakia, oral lesions  []  Dentition good []  Edentulous []  Dental caries present  []  Hearing normal  []  TMs with good light reflexes bilaterally         Neck [x]  Neck of normal appearance and trachea midline        []  No thyromegaly, nodules, or tenderness   []  Full neck ROM        Lymph [x]  No LAD in neck     []  No LAD in supraclavicular area     []  No LAD in axillae   []  No LAD in epitrochlear chains     []  No LAD in inguinal areas        CV [x]  RRR            []  No peripheral edema     []  Pedal pulses intact   []  No abnormal heart sounds appreciated   []  Extremities WWP         Resp []  Normal WOB at rest    []  No breathlessness with speaking, no coughing  []  CTA anteriorly    []  CTA bilaterally    Anteriorly with vent sounds      GI [x]  Normal inspection, NTND   []  NABS     []  No umbilical hernia on exam       []  No hepatosplenomegaly     []  Inspection of perineal and perianal areas normal  Transplanted kidney palpable      GU [x]  Normal external genitalia     [] No urinary catheter present in urethra   []  No CVA tenderness    []  No tenderness over renal allograft        MSK []  No clubbing or cyanosis of hands       []  No vertebral point tenderness  []  No focal tenderness or abnormalities on palpation of joints in RUE, LUE, RLE, or LLE        Skin []  No rashes, lesions, or ulcers of visualized skin     []  Skin warm and dry to palpation   RUE with AVG with thrill, dilated up to shoulder. LUE with graft without thrill      Neuro [x]  Face expression symmetric  []  Sensation to light touch grossly intact throughout    []  Moves extremities equally    []  No tremor noted        []  CNs II-XII grossly intact     []  DTRs normal and symmetric throughout []  Gait unremarkable  Not responding to commands, not opening eyes to speech or painful stimuli      Psych []  Appropriate affect       []  Fluent speech         []  Attentive, good eye contact  []  Oriented to person, place, time          []  Judgment and insight are appropriate   Sedated as above        Data for Medical Decision Making     Recent Labs   Lab Units 09/08/21  0534 07-Oct-2021  2340 10-07-21  2239 2021/10/07  1741 Oct 07, 2021  1730 10/07/2021  1419   WBC 10*9/L  --   --   --   --   --  11.0   HEMOGLOBIN g/dL  --   --   --   --   --  9.8*   HEMOGLOBIN BG g/dL  --   --   --   --  16.1*  --    PLATELET COUNT (1) 10*9/L  --   --   --   --   --  76*   NEUTRO ABS 10*9/L  --   --   --   --   --  10.6*   LYMPHO ABS 10*9/L  --   --   --   --   --  0.1*   EOSINO ABS 10*9/L  --   --   --   --   --  0.0   BUN mg/dL 90*  --  96*   < >  --  92*   CREATININE mg/dL 0.96*  --  0.45*   < >  --  5.23*   AST U/L 90*  --   --    < >  --  90*   ALT U/L 21  --   --    < >  --  19   BILIRUBIN TOTAL mg/dL 1.9*  --   --    < >  --  1.5*   ALK PHOS U/L 87  --   --    < >  --  122*   POTASSIUM WHOLE BLOOD   --    < >  --    < > 4.3  --    POTASSIUM mmol/L 5.2*  --  4.8   < >  --  4.5   MAGNESIUM mg/dL  --   --  1.5*  --   --   --    PHOSPHORUS mg/dL  --   --  4.6  --   --   --    CALCIUM mg/dL 8.8  --  8.7   < >  --  9.0   CRP mg/L 317.0*  --   --    < >  --   --     < > = values in this interval not displayed.     I reviewed and noted the following labs: WBC 11.0, Hgb 10.0, Plt 76, Cr 5.4, CRP 317    Microbiology:  Past cultures were reviewed in Epic and CareEverywhere personally.    9/25 Bcx pending  9/25 Ucx pending, UA 3 WBC  9/25 Lower resp cx, NGTD  9/25 SARS-CoV-2 negative    Imaging:  CXR 9/25:    IMPRESSION:     Extensive right upper lobe consolidative changes with other bilateral patchy airspace opacities representing a component of pulmonary edema and/or multifocal sites of pneumonia. Follow-up radiographs upon completion of medical therapy are recommended.     Endotracheal tube barely at the level of the clavicles, consider advancing 1 to 2 cm.    TTE pending    Independent visualization of images: I independently reviewed the image from 9/25 and I agree with the findings/interpretation.    Additional Studies:   9/26 EKG QTc 461    Serologies:  Lab Results   Component Value Date    CMV IgG NEGATIVE 06/11/2007    EBV IgG POSITIVE 06/11/2007    Hepatitis B Surface Ag Negative 08/02/2013    Hep B S Ab POSITIVE 06/11/2007    Hepatitis C Ab Nonreactive 05/21/2015    Hepatitis C Ab Negative 08/02/2013    RPR NON-REACTIVE 06/11/2007    HSV  1 IgG NEGATIVE 06/11/2007    HSV 2 IgG POSITIVE 06/11/2007    Varicella IgG POSITIVE 06/11/2007    Toxoplasma Gondii IgG NEGATIVE 08/31/2005       Immunizations:  Immunization History   Administered Date(s) Administered    COVID-19 VACCINE,MRNA(MODERNA)(PF)(IM) 01/18/2020, 02/08/2020, 09/05/2020    INFLUENZA TIV (TRI) PF (IM) 12/27/2007, 09/11/2008, 12/31/2009, 09/02/2010, 10/27/2011, 10/25/2012    Influenza Vaccine Quad (IIV4 PF) 56mo+ injectable 09/25/2014, 10/21/2016, 12/17/2017, 09/21/2018, 09/08/2019, 09/05/2020    PNEUMOCOCCAL POLYSACCHARIDE 23 02/14/2018    Pneumococcal Conjugate 13-Valent 03/28/2014, 09/25/2014    Pneumococcal, Unspecified Formulation 12/31/2009    SHINGRIX-ZOSTER VACCINE (HZV), RECOMBINANT,SUB-UNIT,ADJUVANTED IM 06/18/2017, 05/20/2018

## 2021-09-08 NOTE — Unmapped (Signed)
Vancomycin Therapeutic Monitoring Pharmacy Note    Miguel Ward is a 70 y.o. male continuing vancomycin. Date of therapy initiation: 9/25    Indication: Suspected infection     Prior Dosing Information: Received 1500mg  load in ED (9/25 ~1600)     Goals:  Therapeutic Drug Levels  Vancomycin trough goal: 10-15 mg/L    Additional Clinical Monitoring/Outcomes  Renal function, volume status (intake and output)    Results: Not applicable    Wt Readings from Last 1 Encounters:   08/24/2021 71.7 kg (158 lb 1.1 oz)     Creatinine   Date Value Ref Range Status   09/06/2021 5.52 (H) 0.60 - 1.10 mg/dL Final   16/09/9603 5.40 (H) 0.60 - 1.10 mg/dL Final   98/10/9146 8.29 (H) 0.60 - 1.10 mg/dL Final      Pharmacokinetic Considerations and Significant Drug Interactions:  ??? Adult (estimated initial): Vd = 50.907 L, ke = 0.0154 hr-1 (AKI)  ??? Concurrent nephrotoxic meds: not applicable    Assessment/Plan:  Recommendation(s)  ??? Will defer scheduled dosing and intermittently dose by level due to AKI (baseline Scr ~3)     ??? Level should remain therapeutic pending decision to initiate CRRT vs. iHD (extrapolated 36hr level ~15 mg/L) - will follow and adjust level timing as necessary   ??? Estimated trough on recommended regimen: Not applicable - dosing by level    Follow-up  ??? Level due: with 9/27 AM labs (~36hr level)  ??? A pharmacist will continue to monitor and order levels as appropriate    Please page service pharmacist with questions/clarifications.    Lindaann Pascal, PharmD

## 2021-09-09 LAB — BLOOD GAS CRITICAL CARE PANEL, ARTERIAL
BASE EXCESS ARTERIAL: -7.6 — ABNORMAL LOW (ref -2.0–2.0)
BASE EXCESS ARTERIAL: -4.7 — ABNORMAL LOW (ref -2.0–2.0)
BASE EXCESS ARTERIAL: -4.4 — ABNORMAL LOW (ref -2.0–2.0)
BASE EXCESS ARTERIAL: -3.9 — ABNORMAL LOW (ref -2.0–2.0)
BASE EXCESS ARTERIAL: -3.2 — ABNORMAL LOW (ref -2.0–2.0)
BASE EXCESS ARTERIAL: -2.8 — ABNORMAL LOW (ref -2.0–2.0)
CALCIUM IONIZED ARTERIAL (MG/DL): 4.76 mg/dL (ref 4.40–5.40)
CALCIUM IONIZED ARTERIAL (MG/DL): 4.9 mg/dL (ref 4.40–5.40)
CALCIUM IONIZED ARTERIAL (MG/DL): 4.83 mg/dL (ref 4.40–5.40)
CALCIUM IONIZED ARTERIAL (MG/DL): 4.64 mg/dL (ref 4.40–5.40)
CALCIUM IONIZED ARTERIAL (MG/DL): 4.75 mg/dL (ref 4.40–5.40)
CALCIUM IONIZED ARTERIAL (MG/DL): 4.73 mg/dL (ref 4.40–5.40)
GLUCOSE WHOLE BLOOD: 91 mg/dL (ref 70–179)
GLUCOSE WHOLE BLOOD: 90 mg/dL (ref 70–179)
GLUCOSE WHOLE BLOOD: 103 mg/dL (ref 70–179)
GLUCOSE WHOLE BLOOD: 87 mg/dL (ref 70–179)
GLUCOSE WHOLE BLOOD: 102 mg/dL (ref 70–179)
GLUCOSE WHOLE BLOOD: 96 mg/dL (ref 70–179)
HCO3 ARTERIAL: 16 mmol/L — ABNORMAL LOW (ref 22–27)
HCO3 ARTERIAL: 18 mmol/L — ABNORMAL LOW (ref 22–27)
HCO3 ARTERIAL: 19 mmol/L — ABNORMAL LOW (ref 22–27)
HCO3 ARTERIAL: 20 mmol/L — ABNORMAL LOW (ref 22–27)
HCO3 ARTERIAL: 20 mmol/L — ABNORMAL LOW (ref 22–27)
HCO3 ARTERIAL: 20 mmol/L — ABNORMAL LOW (ref 22–27)
HEMOGLOBIN BLOOD GAS: 7.8 g/dL — ABNORMAL LOW (ref 13.50–17.50)
HEMOGLOBIN BLOOD GAS: 10.5 g/dL — ABNORMAL LOW (ref 13.50–17.50)
HEMOGLOBIN BLOOD GAS: 10.1 g/dL — ABNORMAL LOW (ref 13.50–17.50)
HEMOGLOBIN BLOOD GAS: 9.9 g/dL — ABNORMAL LOW (ref 13.50–17.50)
HEMOGLOBIN BLOOD GAS: 11.6 g/dL — ABNORMAL LOW
HEMOGLOBIN BLOOD GAS: 11.1 g/dL — ABNORMAL LOW
LACTATE BLOOD ARTERIAL: 2.8 mmol/L — ABNORMAL HIGH (ref <1.3)
LACTATE BLOOD ARTERIAL: 2.5 mmol/L — ABNORMAL HIGH (ref <1.3)
LACTATE BLOOD ARTERIAL: 2.2 mmol/L — ABNORMAL HIGH (ref <1.3)
LACTATE BLOOD ARTERIAL: 2 mmol/L — ABNORMAL HIGH (ref <1.3)
LACTATE BLOOD ARTERIAL: 2.1 mmol/L — ABNORMAL HIGH (ref <1.3)
LACTATE BLOOD ARTERIAL: 2.2 mmol/L — ABNORMAL HIGH (ref <1.3)
O2 SATURATION ARTERIAL: 98.2 % (ref 94.0–100.0)
O2 SATURATION ARTERIAL: 96.9 % (ref 94.0–100.0)
O2 SATURATION ARTERIAL: 98.3 % (ref 94.0–100.0)
O2 SATURATION ARTERIAL: 98.7 % (ref 94.0–100.0)
O2 SATURATION ARTERIAL: 96.9 % (ref 94.0–100.0)
O2 SATURATION ARTERIAL: 93.6 % — ABNORMAL LOW (ref 94.0–100.0)
PCO2 ARTERIAL: 25.3 mmHg — ABNORMAL LOW (ref 35.0–45.0)
PCO2 ARTERIAL: 26.2 mmHg — ABNORMAL LOW (ref 35.0–45.0)
PCO2 ARTERIAL: 29.5 mmHg — ABNORMAL LOW (ref 35.0–45.0)
PCO2 ARTERIAL: 29.2 mmHg — ABNORMAL LOW (ref 35.0–45.0)
PCO2 ARTERIAL: 28.6 mmHg — ABNORMAL LOW (ref 35.0–45.0)
PCO2 ARTERIAL: 27.6 mmHg — ABNORMAL LOW (ref 35.0–45.0)
PH ARTERIAL: 7.41 (ref 7.35–7.45)
PH ARTERIAL: 7.45 (ref 7.35–7.45)
PH ARTERIAL: 7.42 (ref 7.35–7.45)
PH ARTERIAL: 7.43 (ref 7.35–7.45)
PH ARTERIAL: 7.46 — ABNORMAL HIGH (ref 7.35–7.45)
PH ARTERIAL: 7.48 — ABNORMAL HIGH (ref 7.35–7.45)
PO2 ARTERIAL: 88.3 mmHg (ref 80.0–110.0)
PO2 ARTERIAL: 77.1 mmHg — ABNORMAL LOW (ref 80.0–110.0)
PO2 ARTERIAL: 100 mmHg (ref 80.0–110.0)
PO2 ARTERIAL: 104 mmHg (ref 80.0–110.0)
PO2 ARTERIAL: 81.4 mmHg (ref 80.0–110.0)
PO2 ARTERIAL: 62.5 mmHg — ABNORMAL LOW (ref 80.0–110.0)
POTASSIUM WHOLE BLOOD: 4.6 mmol/L (ref 3.4–4.6)
POTASSIUM WHOLE BLOOD: 4.7 mmol/L — ABNORMAL HIGH (ref 3.4–4.6)
POTASSIUM WHOLE BLOOD: 4.3 mmol/L (ref 3.4–4.6)
POTASSIUM WHOLE BLOOD: 4.3 mmol/L (ref 3.4–4.6)
POTASSIUM WHOLE BLOOD: 4.5 mmol/L (ref 3.4–4.6)
POTASSIUM WHOLE BLOOD: 4.5 mmol/L (ref 3.4–4.6)
SODIUM WHOLE BLOOD: 134 mmol/L — ABNORMAL LOW (ref 135–145)
SODIUM WHOLE BLOOD: 133 mmol/L — ABNORMAL LOW (ref 135–145)
SODIUM WHOLE BLOOD: 133 mmol/L — ABNORMAL LOW (ref 135–145)
SODIUM WHOLE BLOOD: 134 mmol/L — ABNORMAL LOW (ref 135–145)
SODIUM WHOLE BLOOD: 134 mmol/L — ABNORMAL LOW (ref 135–145)
SODIUM WHOLE BLOOD: 134 mmol/L — ABNORMAL LOW (ref 135–145)

## 2021-09-09 LAB — BASIC METABOLIC PANEL
ANION GAP: 12 mmol/L (ref 5–14)
ANION GAP: 10 mmol/L (ref 5–14)
ANION GAP: 9 mmol/L (ref 5–14)
BLOOD UREA NITROGEN: 81 mg/dL — ABNORMAL HIGH (ref 9–23)
BLOOD UREA NITROGEN: 60 mg/dL — ABNORMAL HIGH (ref 9–23)
BLOOD UREA NITROGEN: 52 mg/dL — ABNORMAL HIGH (ref 9–23)
BUN / CREAT RATIO: 19
BUN / CREAT RATIO: 19
BUN / CREAT RATIO: 19
CALCIUM: 8.8 mg/dL (ref 8.7–10.4)
CALCIUM: 8.8 mg/dL (ref 8.7–10.4)
CALCIUM: 8.1 mg/dL — ABNORMAL LOW (ref 8.7–10.4)
CHLORIDE: 106 mmol/L (ref 98–107)
CHLORIDE: 102 mmol/L (ref 98–107)
CHLORIDE: 102 mmol/L (ref 98–107)
CO2: 17 mmol/L — ABNORMAL LOW (ref 20.0–31.0)
CO2: 20 mmol/L (ref 20.0–31.0)
CO2: 21 mmol/L (ref 20.0–31.0)
CREATININE: 4.24 mg/dL — ABNORMAL HIGH
CREATININE: 3.16 mg/dL — ABNORMAL HIGH
CREATININE: 2.72 mg/dL — ABNORMAL HIGH
EGFR CKD-EPI (2021) MALE: 15 mL/min/{1.73_m2} — ABNORMAL LOW (ref >=60)
EGFR CKD-EPI (2021) MALE: 21 mL/min/{1.73_m2} — ABNORMAL LOW (ref >=60)
EGFR CKD-EPI (2021) MALE: 25 mL/min/{1.73_m2} — ABNORMAL LOW (ref >=60)
GLUCOSE RANDOM: 92 mg/dL (ref 70–179)
GLUCOSE RANDOM: 96 mg/dL (ref 70–179)
GLUCOSE RANDOM: 101 mg/dL (ref 70–179)
POTASSIUM: 5 mmol/L — ABNORMAL HIGH (ref 3.4–4.8)
POTASSIUM: 4.8 mmol/L (ref 3.4–4.8)
POTASSIUM: 5 mmol/L — ABNORMAL HIGH (ref 3.4–4.8)
SODIUM: 135 mmol/L (ref 135–145)
SODIUM: 132 mmol/L — ABNORMAL LOW (ref 135–145)
SODIUM: 132 mmol/L — ABNORMAL LOW (ref 135–145)

## 2021-09-09 LAB — CBC
HEMATOCRIT: 31 % — ABNORMAL LOW (ref 39.0–48.0)
HEMATOCRIT: 28.3 % — ABNORMAL LOW (ref 39.0–48.0)
HEMATOCRIT: 26.3 % — ABNORMAL LOW (ref 39.0–48.0)
HEMOGLOBIN: 10.2 g/dL — ABNORMAL LOW (ref 12.9–16.5)
HEMOGLOBIN: 9.2 g/dL — ABNORMAL LOW (ref 12.9–16.5)
HEMOGLOBIN: 8.7 g/dL — ABNORMAL LOW (ref 12.9–16.5)
MEAN CORPUSCULAR HEMOGLOBIN CONC: 33 g/dL (ref 32.0–36.0)
MEAN CORPUSCULAR HEMOGLOBIN CONC: 32.6 g/dL (ref 32.0–36.0)
MEAN CORPUSCULAR HEMOGLOBIN CONC: 33.2 g/dL (ref 32.0–36.0)
MEAN CORPUSCULAR HEMOGLOBIN: 27.6 pg (ref 25.9–32.4)
MEAN CORPUSCULAR HEMOGLOBIN: 27.4 pg (ref 25.9–32.4)
MEAN CORPUSCULAR HEMOGLOBIN: 27.5 pg (ref 25.9–32.4)
MEAN CORPUSCULAR VOLUME: 83.8 fL (ref 77.6–95.7)
MEAN CORPUSCULAR VOLUME: 84.2 fL (ref 77.6–95.7)
MEAN CORPUSCULAR VOLUME: 82.9 fL (ref 77.6–95.7)
MEAN PLATELET VOLUME: 11 fL — ABNORMAL HIGH (ref 6.8–10.7)
MEAN PLATELET VOLUME: 11.2 fL — ABNORMAL HIGH (ref 6.8–10.7)
MEAN PLATELET VOLUME: 11.3 fL — ABNORMAL HIGH (ref 6.8–10.7)
PLATELET COUNT: 70 10*9/L — ABNORMAL LOW (ref 150–450)
PLATELET COUNT: 85 10*9/L — ABNORMAL LOW (ref 150–450)
PLATELET COUNT: 88 10*9/L — ABNORMAL LOW (ref 150–450)
RED BLOOD CELL COUNT: 3.69 10*12/L — ABNORMAL LOW (ref 4.26–5.60)
RED BLOOD CELL COUNT: 3.36 10*12/L — ABNORMAL LOW (ref 4.26–5.60)
RED BLOOD CELL COUNT: 3.18 10*12/L — ABNORMAL LOW (ref 4.26–5.60)
RED CELL DISTRIBUTION WIDTH: 15.9 % — ABNORMAL HIGH (ref 12.2–15.2)
RED CELL DISTRIBUTION WIDTH: 15.5 % — ABNORMAL HIGH (ref 12.2–15.2)
RED CELL DISTRIBUTION WIDTH: 15.3 % — ABNORMAL HIGH (ref 12.2–15.2)
WBC ADJUSTED: 9.4 10*9/L (ref 3.6–11.2)
WBC ADJUSTED: 11.7 10*9/L — ABNORMAL HIGH (ref 3.6–11.2)
WBC ADJUSTED: 12.4 10*9/L — ABNORMAL HIGH (ref 3.6–11.2)

## 2021-09-09 LAB — CBC W/ AUTO DIFF
BASOPHILS ABSOLUTE COUNT: 0 10*9/L (ref 0.0–0.1)
BASOPHILS RELATIVE PERCENT: 0.3 %
EOSINOPHILS ABSOLUTE COUNT: 0 10*9/L (ref 0.0–0.5)
EOSINOPHILS RELATIVE PERCENT: 0.2 %
HEMATOCRIT: 30.4 % — ABNORMAL LOW (ref 39.0–48.0)
HEMOGLOBIN: 10 g/dL — ABNORMAL LOW (ref 12.9–16.5)
LYMPHOCYTES ABSOLUTE COUNT: 0.1 10*9/L — ABNORMAL LOW (ref 1.1–3.6)
LYMPHOCYTES RELATIVE PERCENT: 0.9 %
MEAN CORPUSCULAR HEMOGLOBIN CONC: 32.9 g/dL (ref 32.0–36.0)
MEAN CORPUSCULAR HEMOGLOBIN: 27.4 pg (ref 25.9–32.4)
MEAN CORPUSCULAR VOLUME: 83.3 fL (ref 77.6–95.7)
MEAN PLATELET VOLUME: 11.3 fL — ABNORMAL HIGH (ref 6.8–10.7)
MONOCYTES ABSOLUTE COUNT: 0.1 10*9/L — ABNORMAL LOW (ref 0.3–0.8)
MONOCYTES RELATIVE PERCENT: 0.7 %
NEUTROPHILS ABSOLUTE COUNT: 11.4 10*9/L — ABNORMAL HIGH (ref 1.8–7.8)
NEUTROPHILS RELATIVE PERCENT: 97.9 %
PLATELET COUNT: 86 10*9/L — ABNORMAL LOW (ref 150–450)
RED BLOOD CELL COUNT: 3.65 10*12/L — ABNORMAL LOW (ref 4.26–5.60)
RED CELL DISTRIBUTION WIDTH: 15.9 % — ABNORMAL HIGH (ref 12.2–15.2)
WBC ADJUSTED: 11.6 10*9/L — ABNORMAL HIGH (ref 3.6–11.2)

## 2021-09-09 LAB — HEPATIC FUNCTION PANEL
ALBUMIN: 2 g/dL — ABNORMAL LOW (ref 3.4–5.0)
ALKALINE PHOSPHATASE: 80 U/L (ref 46–116)
ALT (SGPT): 23 U/L (ref 10–49)
AST (SGOT): 88 U/L — ABNORMAL HIGH (ref <=34)
BILIRUBIN DIRECT: 1.6 mg/dL — ABNORMAL HIGH (ref 0.00–0.30)
BILIRUBIN TOTAL: 1.9 mg/dL — ABNORMAL HIGH (ref 0.3–1.2)
PROTEIN TOTAL: 5.5 g/dL — ABNORMAL LOW (ref 5.7–8.2)

## 2021-09-09 LAB — MAGNESIUM
MAGNESIUM: 1.8 mg/dL (ref 1.6–2.6)
MAGNESIUM: 1.6 mg/dL (ref 1.6–2.6)
MAGNESIUM: 1.7 mg/dL (ref 1.6–2.6)

## 2021-09-09 LAB — PROTIME-INR
INR: 2.23
PROTIME: 26 s — ABNORMAL HIGH (ref 9.8–12.8)

## 2021-09-09 LAB — APTT
APTT: 200.6 s (ref 25.1–36.5)
APTT: 80 s — ABNORMAL HIGH (ref 25.1–36.5)
HEPARIN CORRELATION: 1.1
HEPARIN CORRELATION: 0.5

## 2021-09-09 LAB — VANCOMYCIN, RANDOM: VANCOMYCIN RANDOM: 13.7 ug/mL

## 2021-09-09 LAB — FUNGITELL ASSAY
FUNGITELL QUALITATIVE: NEGATIVE
FUNGITELL: <31 pg/mL

## 2021-09-09 LAB — TACROLIMUS LEVEL, TIMED: TACROLIMUS BLOOD: 9.8 ng/mL

## 2021-09-09 LAB — PHOSPHORUS
PHOSPHORUS: 3.7 mg/dL (ref 2.4–5.1)
PHOSPHORUS: 3.3 mg/dL (ref 2.4–5.1)
PHOSPHORUS: 2.9 mg/dL (ref 2.4–5.1)

## 2021-09-09 LAB — FIBRINOGEN: FIBRINOGEN LEVEL: 403 mg/dL (ref 175–500)

## 2021-09-09 LAB — CK: CREATINE KINASE TOTAL: 1205 U/L — ABNORMAL HIGH

## 2021-09-09 LAB — D-DIMER, QUANTITATIVE: D-DIMER QUANTITATIVE (CW,ML,HL,HS,CH,JS,JC): 5556 ng{FEU}/mL — ABNORMAL HIGH (ref <=500)

## 2021-09-09 MED ADMIN — cefepime (MAXIPIME) 1 g in sodium chloride 0.9 % (NS) 100 mL IVPB-connector bag: 1 g | INTRAVENOUS | @ 19:00:00 | Stop: 2021-09-15

## 2021-09-09 MED ADMIN — magnesium oxide (MAG-OX) tablet 400 mg: 400 mg | GASTROENTERAL | @ 13:00:00

## 2021-09-09 MED ADMIN — NxStage/multiBic RFP 401 (+/- BB) 5000 mL - contains 4 mEq/L of potassium dialysis solution 5,000 mL: 5000 mL | INTRAVENOUS_CENTRAL | @ 06:00:00

## 2021-09-09 MED ADMIN — white petrolatum-mineral oil (SOOTHE PM) 80-20 % ophthalmic ointment 1 application: 1 | OPHTHALMIC

## 2021-09-09 MED ADMIN — heparin 25,000 Units/250 mL (100 units/mL) in 0.45% saline infusion (premade): 12 [IU]/kg/h | INTRAVENOUS | @ 03:00:00

## 2021-09-09 MED ADMIN — cefepime (MAXIPIME) 1 g in sodium chloride 0.9 % (NS) 100 mL IVPB-connector bag: 1 g | INTRAVENOUS | @ 10:00:00 | Stop: 2021-09-15

## 2021-09-09 MED ADMIN — hydrocortisone sod succ (Solu-CORTEF) injection 50 mg: 50 mg | INTRAVENOUS | @ 03:00:00

## 2021-09-09 MED ADMIN — hydrocortisone sod succ (Solu-CORTEF) injection 50 mg: 50 mg | INTRAVENOUS | @ 10:00:00

## 2021-09-09 MED ADMIN — chlorhexidine (PERIDEX) 0.12 % solution 5 mL: 5 mL | OROMUCOSAL | @ 01:00:00

## 2021-09-09 MED ADMIN — chlorhexidine (PERIDEX) 0.12 % solution 5 mL: 5 mL | OROMUCOSAL | @ 13:00:00

## 2021-09-09 MED ADMIN — levothyroxine (SYNTHROID) tablet 250 mcg: 250 ug | GASTROENTERAL | @ 10:00:00

## 2021-09-09 MED ADMIN — vancomycin (VANCOCIN) IVPB 1000 mg (premix): 1000 mg | INTRAVENOUS | @ 13:00:00 | Stop: 2021-09-09

## 2021-09-09 MED ADMIN — acetaminophen (TYLENOL) tablet 1,000 mg: 1000 mg | GASTROENTERAL | @ 06:00:00 | Stop: 2021-10-07

## 2021-09-09 MED ADMIN — acetaminophen (TYLENOL) tablet 1,000 mg: 1000 mg | GASTROENTERAL | @ 22:00:00 | Stop: 2021-10-07

## 2021-09-09 MED ADMIN — chlorhexidine (PERIDEX) 0.12 % solution 5 mL: 5 mL | OROMUCOSAL

## 2021-09-09 MED ADMIN — sodium bicarbonate tablet 1,300 mg: 1300 mg | GASTROENTERAL | @ 17:00:00 | Stop: 2021-09-09

## 2021-09-09 MED ADMIN — aspirin chewable tablet 81 mg: 81.0 mg | GASTROENTERAL | @ 13:00:00

## 2021-09-09 MED ADMIN — norepinephrine 8 mg in dextrose 5 % 250 mL (32 mcg/mL) infusion PMB: 0-30 ug/min | INTRAVENOUS | @ 13:00:00

## 2021-09-09 MED ADMIN — sodium bicarbonate tablet 1,300 mg: 1300 mg | GASTROENTERAL

## 2021-09-09 MED ADMIN — esomeprazole (NexIUM) granules 40 mg: 40 mg | GASTROENTERAL | @ 10:00:00

## 2021-09-09 MED ADMIN — magnesium oxide (MAG-OX) tablet 400 mg: 400 mg | GASTROENTERAL

## 2021-09-09 MED ADMIN — hydrocortisone sod succ (Solu-CORTEF) injection 50 mg: 50 mg | INTRAVENOUS | @ 22:00:00

## 2021-09-09 MED ADMIN — hydrocortisone sod succ (Solu-CORTEF) injection 50 mg: 50 mg | INTRAVENOUS | @ 17:00:00

## 2021-09-09 MED ADMIN — sodium bicarbonate tablet 1,300 mg: 1300 mg | GASTROENTERAL | @ 10:00:00 | Stop: 2021-09-09

## 2021-09-09 MED ADMIN — white petrolatum-mineral oil (SOOTHE PM) 80-20 % ophthalmic ointment 1 application: 1 | OPHTHALMIC | @ 14:00:00

## 2021-09-09 MED ADMIN — atorvastatin (LIPITOR) tablet 20 mg: 20 mg | GASTROENTERAL

## 2021-09-09 MED ADMIN — polyethylene glycol (MIRALAX) packet 17 g: 17 g | GASTROENTERAL | @ 13:00:00

## 2021-09-09 MED ADMIN — levoFLOXacin (LEVAQUIN) 500 mg/100 mL IVPB 500 mg: 500 mg | INTRAVENOUS | @ 19:00:00 | Stop: 2021-09-15

## 2021-09-09 MED ADMIN — acetaminophen (TYLENOL) tablet 1,000 mg: 1000 mg | GASTROENTERAL | @ 14:00:00 | Stop: 2021-10-07

## 2021-09-09 MED ADMIN — cefepime (MAXIPIME) 1 g in sodium chloride 0.9 % (NS) 100 mL IVPB-connector bag: 1 g | INTRAVENOUS | @ 02:00:00 | Stop: 2021-09-15

## 2021-09-09 NOTE — Unmapped (Signed)
Respiratory Safety Check    Date: 09/08/2021     PPV Device at bedside:   [x]  Yes  []  Replaced  []  N/A    PPV mask at bedside:  [x]  Yes  []  Replaced  []  N/A    Checked humidifier, inspiratory limb, and expiratory limb:   [x]  Yes  []  Replaced  []  N/A    Backup trachs and supplies available at bedside:  []  Yes  []  Replaced  []  N/A    Documented trach airway form visible at bedside:   []  Yes  []  Replaced  []  N/A    iNO connected with PPV device:  []  Yes  []  Replaced  []  N/A

## 2021-09-09 NOTE — Unmapped (Signed)
Care Management  Initial Transition Planning Assessment    Patient lives with Navjot Loera (wife) in [place] [facility]) in a  single level home with nine steps to enter. At baseline patient ambulates without assistance and is independent with ADL's. He does not use home health services and has no DME.  Natalia Le[facility] will transport home. Wife and adult daughters assist with basic care. Patient is vaccinated and received a booster.              General  Care Manager assessed the patient by : Telephone conversation with family, Medical record review, Discussion with Clinical Care team  Orientation Level: Other (Comment) (UTA, vent)  Who provides care at home?: N/A    Contact/Decision Maker  Extended Emergency Contact Information  Primary Emergency Contact: Habeeb,Katherine   [place]  Home Phone: [phone]  Mobile Phone: [phone]  Relation: Spouse    Legal Next of Kin / Guardian / POA / Advance Directives     HCDM (patient stated preference): Krinke,Katherine - Spouse - [phone]    Advance Directive (Medical Treatment)  Does patient have an advance directive covering medical treatment?: Patient does not have advance directive covering medical treatment.  Reason patient does not have an advance directive covering medical treatment:: Patient has been deemed unable to make medical decisions, cannot communicate.    Health Care Decision Maker [HCDM] (Medical & Mental Health Treatment)  Healthcare Decision Maker: HCDM documented in the HCDM/Contact Info section. Amadi Frady (wife))         Readmission Information    Have you been hospitalized in the last 30 days?: No            Patient Information  Lives with: Spouse/significant other    Type of Residence: Private residence (Single level home with 9 steps to enter.)        Location/Detail: [address]  [place] [address]    Support Systems/Concerns: Spouse, Children (Wife and 2 adult daughters)    Responsibilities/Dependents at home?: No    Home Care services in place prior to admission?: No                Equipment Currently Used at Home: none       Currently receiving outpatient dialysis?: No       Financial Information       Need for financial assistance?: No       Social Determinants of Health  Social Determinants of Health     Tobacco Use: Low Risk    ??? Smoking Tobacco Use: Never Smoker   ??? Smokeless Tobacco Use: Never Used   Alcohol Use: Not on file   Financial Resource Strain: Low Risk    ??? Difficulty of Paying Living Expenses: Not very hard   Food Insecurity: No Food Insecurity   ??? Worried About Running Out of Food in the Last Year: Never true   ??? Ran Out of Food in the Last Year: Never true   Transportation Needs: No Transportation Needs   ??? Lack of Transportation (Medical): No   ??? Lack of Transportation (Non-Medical): No   Physical Activity: Not on file   Stress: Not on file   Social Connections: Not on file   Intimate Partner Violence: Not on file   Depression: Not on file   Housing/Utilities: Low Risk    ??? Within the past 12 months, have you ever stayed: outside, in a car, in a tent, in an overnight shelter, or temporarily  in someone else's home (i.e. couch-surfing)?: No   ??? Are you worried about losing your housing?: No   ??? Within the past 12 months, have you been unable to get utilities (heat, electricity) when it was really needed?: No   Substance Use: Not on file   Health Literacy: Not on file       Complex Discharge Information    Is patient identified as a difficult/complex discharge?: No       Discharge Needs Assessment  Concerns to be Addressed: discharge planning    Clinical Risk Factors: > 65, New Diagnosis, Multiple Diagnoses (Chronic), Functional Limitations    Barriers to taking medications: No    Prior overnight hospital stay or ED visit in last 90 days: No         Patient's Choice of Community Agency(s): Family is agreeable to home health services if recommended, no preference of agency but would like to look at a list of agencies if [facility] is recommended.    Anticipated Changes Related to Illness: other (see comments) (Likely will need time to recover before resuming usual activities.)    Equipment Needed After Discharge: other (see comments) (CM will follow for DME needs.)    Discharge Facility/Level of Care Needs: other (see comments) (CM will follow for DC needs.)    Readmission  Risk of Unplanned Readmission Score: UNPLANNED READMISSION SCORE: 27.42%  Predictive Model Details          27% (High)  Factor Value    Calculated 09/08/2021 16:02 25% Number of active Rx orders 46    [place] Risk of Unplanned Readmission Model 8% Diagnosis of cancer present     7% ECG/EKG order present in last 6 months     6% Latest BUN high (90 mg/dL)     6% Restraint order present in last 6 months     5% Imaging order present in last 6 months     5% Latest hemoglobin low (10.1 g/dL)     5% Phosphorous result present     5% Charlson Comorbidity Index 5     4% Number of ED visits in last six months 1     4% Age 70     4% Diagnosis of deficiency anemia present     3% Active anticoagulant Rx order present     3% Active corticosteroid Rx order present     3% Latest creatinine high (5.40 mg/dL)     3% Diagnosis of renal failure present     2% Future appointment scheduled     1% Current length of stay 0.993 days     1% Active ulcer medication Rx order present      Readmitted Within the Last 30 Days? (No if blank)   Patient at risk for readmission?: Yes    Discharge Plan  Screen findings are: Discharge planning needs identified or anticipated (Comment). (CM will follow for DC needs.)    Expected Discharge Date:     Expected Transfer from Critical Care:  (Undetermined.)       Patient and/or family were provided with choice of facilities / services that are available and appropriate to meet post hospital care needs?: Other (Comment) (Choice of facilities/services will be provided if deemed medically necessary.)       Initial Assessment complete?: Yes

## 2021-09-09 NOTE — Unmapped (Signed)
Vancomycin Therapeutic Monitoring Pharmacy Note    Miguel Ward is a 70 y.o. male continuing vancomycin. Date of therapy initiation: 08/15/2021    Indication: Pneumonia    Prior Dosing Information: 1500mg  x 1 in ED 9/25     Goals:  Therapeutic Drug Levels  Vancomycin trough goal: 10-20 mg/L    Additional Clinical Monitoring/Outcomes  Renal function, volume status (intake and output)    Results: vancomycin level: 13.7 mg/L (drawn as a random level with AM labs)    Wt Readings from Last 1 Encounters:   08/29/2021 71.7 kg (158 lb 1.1 oz)     Creatinine   Date Value Ref Range Status   09/09/2021 4.24 (H) 0.60 - 1.10 mg/dL Final   16/09/9603 5.40 (H) 0.60 - 1.10 mg/dL Final   98/10/9146 8.29 (H) 0.60 - 1.10 mg/dL Final        Pharmacokinetic Considerations and Significant Drug Interactions:  ??? on CRRT with estimated Ke ~ 0.0293  ??? Concurrent nephrotoxic meds: not applicable    Assessment/Plan:  Recommendation(s)  ??? Start vancomycin 1000mg  every 24 hours  ??? Estimated trough on recommended regimen: 10-20 mg/L    Follow-up  ??? Level due: prior to fourth or fifth dose  ??? A pharmacist will continue to monitor and order levels as appropriate    Please page service pharmacist with questions/clarifications.    Charleen Kirks, PharmD

## 2021-09-09 NOTE — Unmapped (Signed)
Respiratory Safety Check    Date: 09/09/2021     PPV Device at bedside:   [x]  Yes  []  Replaced  []  N/A    PPV mask at bedside:  [x]  Yes  []  Replaced  []  N/A    Checked humidifier, inspiratory limb, and expiratory limb:   [x]  Yes  []  Replaced  []  N/A    Backup trachs and supplies available at bedside:  []  Yes  []  Replaced  []  N/A    Documented trach airway form visible at bedside:   []  Yes  []  Replaced  []  N/A    iNO connected with PPV device:  []  Yes  []  Replaced  []  N/A

## 2021-09-09 NOTE — Unmapped (Addendum)
Adult Nutrition Assessment Note    Visit Type: MD Consult  Reason for Visit: Enteral Nutrition      HPI & PMH:  Per provider notes: 70 y.o. male renal transplant from lupus nephritis (complex course over last 15 years since transplant followed here including renal cell) here with acute hypoxic respiratory failure, COVID 19 and shock with AKI. Intubated, on CRRT.      Anthropometric Data:  Height: 177.8 cm (5' 10)   Admission weight: 81.6 kg (180 lb)  Last recorded weight: 71.7 kg (158 lb 1.1 oz)  IBW: 75.36 kg  Percent IBW: 95.14 %  BMI: Body mass index is 22.68 kg/m??.   Usual Body Weight: Unable to obtain at this time     Weight history prior to admission: Unable to obtain at this time    Wt Readings from Last 10 Encounters:   September 13, 2021 71.7 kg (158 lb 1.1 oz)   07/30/21 79.7 kg (175 lb 12.8 oz)   06/02/21 76.2 kg (168 lb)   05/08/21 75.9 kg (167 lb 4.8 oz)   02/04/21 74.8 kg (165 lb)   01/27/21 74.9 kg (165 lb 3.2 oz)   01/08/21 73.8 kg (162 lb 9.6 oz)   09/05/20 74.6 kg (164 lb 6.4 oz)   05/02/20 74.2 kg (163 lb 9.6 oz)   01/05/20 73.9 kg (163 lb)        Weight changes this admission:   Last 5 Recorded Weights    2021/09/13 1336 2021-09-13 1717   Weight: 81.6 kg (180 lb) 71.7 kg (158 lb 1.1 oz)        Nutrition Focused Physical Exam:  Unable to complete at this time due to patient COVID-19 virus      NUTRITIONALLY RELEVANT DATA     Medications:   Nutritionally pertinent medications reviewed and evaluated for potential food and/or medication interactions.    Calcitriol, solucortef, mag-ox, miralax, NaHCO3 tabs, norepinephrine @ 4 mcg/min    Labs:     Results in Past 7 Days  Result Component Current Result   Sodium 132 (L) (09/09/2021)   Potassium 4.8 (09/09/2021)   Chloride 102 (09/09/2021)   CO2 20.0 (09/09/2021)   BUN 60 (H) (09/09/2021)   Creatinine 3.16 (H) (09/09/2021)   Glucose 96 (09/09/2021)   Calcium 8.8 (09/09/2021)   Magnesium 1.6 (09/09/2021)   Phosphorus 3.3 (09/09/2021)       Nutrition History:   September 09, 2021: Prior to admission: Patient unable to provide at this time. Tube feeds started 9/26: vital High Protein @ 10 ml/hr, to advance today.  Noted RN screen for unsure weight status. Unable to obtain additional weight info at this time.     Allergies, Intolerances, Sensitivities, and/or Cultural/Religious Dietary Restrictions: none identified at this time     Current Nutrition:  Enteral nutrition via OG tube       Nutrition Orders   (From admission, onward)             Start     Ordered    09/08/21 1443  Adult Enteral Nutrition Vital High Protein (Critical Care Low Cal/HP)  (Adult Enteral Nutrition + Nutrition Consult Panel)  Effective now        Question Answer Comment   Enteral Nutrition Formula: Vital High Protein (Critical Care Low Cal/HP)    Feeding Route: NG Tube    Enteral Nutrition Continuous initial rate (mL/hr): 10        09/08/21 1442  Nutritional Needs:   Daily Estimated Nutrient Needs:  Energy: 1800-2160 kcals 25-30 kcal/kg using admission body weight, 72 kg (09/09/21 1422)]  Protein: 108-144 gm [1.5-2.0 gm/kg using admission body weight, 72 kg (09/09/21 1422)]  Carbohydrate:   [no restriction]  Fluid:   mL [per MD team]    As compared to [facility] for mechanically vented pts [place] using the following minute volume/temperature:  9.9/37  ~1721 kcal/day    Will aim to feed between above calculations.     Malnutrition assessment not yet completed at this time due lack of nutrition history and inability to complete nutrition focused physical exam (NFPE).     GOALS and EVALUATION     ??? Patient to meet 65% or greater of nutritional needs via enteral nutrition within the first week of ICU admission. - New    Motivation, Barriers, and Compliance:  Evaluation of motivation, barriers, and compliance pending at this time due to clinical status.     NUTRITION ASSESSMENT     ??? Patient appropriate for enteral nutrition support to meet nutritional needs given respiratory failure requiring mechanical ventilation.   ??? Patient appropriate for fiber-free formula given risk of hypoperfusion of the GI tract and bowel ischemia due to current pressor requirements.       Discharge Planning:   Monitor for potential discharge needs with multi-disciplinary team.       NUTRITION INTERVENTIONS and RECOMMENDATION     1.    Recommend Vital High Protein at goal rate 85 mL/hr. This provides 1785 kcals, 157 g protein, 200 g carbohydrate, 42 g fat, and 1493 mL free water.  2. Free water flush per team, minimum 30 mls Q 4 hours.   3. RD monitoring lytes for appropriate tube feed provision.    4. Weekly weights.     Follow-Up Parameters:   1-2 times per week (and more frequent as indicated)    Joesphine Bare, RDN, LDN, CNSC  Pager [phone]

## 2021-09-09 NOTE — Unmapped (Signed)
Respiratory Safety Check    Date: 09/09/2021     PPV Device at bedside:   [x]  Yes  []  Replaced  []  N/A    PPV mask at bedside:  [x]  Yes  []  Replaced  []  N/A    Checked humidifier, inspiratory limb, and expiratory limb:   [x]  Yes  []  Replaced  []  N/A    Backup trachs and supplies available at bedside:  []  Yes  []  Replaced  [x]  N/A    Documented trach airway form visible at bedside:   []  Yes  []  Replaced  [x]  N/A    iNO connected with PPV device:  []  Yes  []  Replaced  [x]  N/A

## 2021-09-09 NOTE — Unmapped (Signed)
ETT is secure and easily passable. Patient is currently on the ventilator and settings have not changed.  Cont to sxn for small amounts of thick bloody & tan secretions.  No resp distress noted, will cont to monitor.

## 2021-09-09 NOTE — Unmapped (Signed)
Continuous Renal Replacement  Dialysis Nurse Therapy Procedure Note    Treatment Type:  [facility] Number Of Days On Therapy:   Procedure Date:  09/08/2021 6:47 PM     TREATMENT STATUS:  Initiated  Patient and Treatment Status     None          Active Dialysis Orders (168h ago, onward)     Start     Ordered    09/08/21 1428  CRRT Orders - NxStage (Adult)  Continuous        Comments: Fluid removal parameters:   MAP <60: 10 ml/hr;  MAP 61 - 65: 50 ml/hr;   MAP >65: 150 ml/hr   Question Answer Comment   CRRT System: NxStage    Modality: CVVH    Access: Other    BFR (mL/min): 200-350    Dialysate Flow Rate (mL/kg/hr): Other (Specify) 1.8L/hr   Fluid Removal Initial Rate (mL/hr): 10    Fluid Removal Parameters: See below        09/08/21 1429              SYSTEM CHECK:  Machine Name: [id number]  Dialyzer: [id number]   Self Test Completed: Yes.        Alarms Connected To The Wall And Active:  Yes.    VITAL SIGNS:  Temp:  [36.9 ??C (98.4 ??F)-38.6 ??C (101.5 ??F)] 37 ??C (98.6 ??F)  Heart Rate:  [75-111] 80  SpO2 Pulse:  [72-108] 79  Resp:  [16-26] 26  SpO2:  [93 %-99 %] 98 %  A BP-2: (82-117)/(42-64) 104/54  MAP:  [55 mmHg-83 mmHg] 72 mmHg    ACCESS SITE:           Arteriovenous Fistula - Vein Graft  Access 11/01/18 Right;Upper Arm (Active)   Site Assessment Clean;Dry;Intact 09/08/21 1600   AV Fistula Thrill Present;Bruit Present 09/08/21 1600   Status Deaccessed 09/08/21 1600   Dressing Intervention No intervention needed 09/08/21 0800   Dressing Status      No dressing 09/08/21 0800   Site Condition No complications 09/08/21 1600   Dressing Open to air (None) 09/08/21 1600       CATHETER FILL VOLUMES:     Arterial: 1.4 mL  Venous: 1.4 mL     Lab Results   Component Value Date    NA 141 09/08/2021    K 4.3 09/08/2021    CL 107 09/08/2021    CO2 13.0 (L) 09/08/2021    BUN 90 (H) 09/08/2021     Lab Results   Component Value Date    CALCIUM 8.8 09/08/2021    CAION 4.51 09/08/2021    PHOS 4.3 09/08/2021    MG 1.6 09/08/2021 SETTINGS:  Blood Pump Rate: 300 mL/min  Replacement Fluid Rate:     Pre-Blood Pump Fluid Rate:    Hourly Fluid Removal Rate: 10 mL/hr   Dialysate Fluid Rate    Therapy Fluid Temperature:       ANTICOAGULANT:  Heparin bolus 0 units, dose per hour 5 units/hr 2.6 mL/hr    ADDITIONAL COMMENTS:  None    HEMODIALYSIS ON-CALL NURSE PAGER NUMBER:  ?? Monday thru Saturday 0700 - 1730: Call the Dialysis Unit ext. [phone]   ?? After 1730 and all day Sunday: Call the Dialysis RN Pager Number [phone]     PROCEDURE REVIEW, VERIFICATION, HANDOFF:  CRRT settings verified, procedure reviewed, and instructions given to primary RN.     Primary CRRT RN Verifying: [facility] RN Dialysis RN Verifying: Marthe Patch  Berlinda Farve

## 2021-09-09 NOTE — Unmapped (Signed)
IMMUNOCOMPROMISED HOST INFECTIOUS DISEASE PROGRESS NOTE    Assessment/Plan:     Miguel Ward is a 70 y.o. 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 COVID tests negative x 2 here. Imaging and severity suggests alternative infection vs. superinfection, and thus patient started on broad-spectrum coverage with vancomycin/cefepime already with some improvement. Found to have legionellosis and started on levofloxacin, pending workup for other infections in immunocompromised patient. Given improvement prior to legionella therapy and septic shock, concern remains for other bacterial etiology and thus will continue cefepime. Hemodynamic and respiratory status continue to improve in this patient    ID Problem List:  ESRD 2/2 Lupus nephritis s/p deceased donor kidney transplant 05/2007  - Surgical complications: None  - Serologies: CMV D+/R-  - Immunosuppression: TAC 3/2, MMF 250, pred 5  - Prophylaxis prior to admission: acyclovir (hx HSV)  - Rejection history: Early cellular s/p ATG 2008  ??  ??  Pertinent Co-morbidities  SLE with lupus nephritis  CKDIV  RCC R native kidney s/p nephrectomy 02/2014  Suspected RCC transplanted kidney 12/2020, sp percutaneous cryoablation 06/2021  HFmREF (40-45% 06/2019)  CAD  HTN  ??  ??  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  Infection History  Active infections:  #Multifocal pneumonia with Hypoxic respiratory failure and Septic shock, improving  #Legionellosis, 2021-10-02  Tested positive for COVID Ag at home 09/05/21, found minimally responsive and hypoglycemic 02-Oct-2021, intubated in ED, imaging R>L multifocal opacities. Legionella Ag positive, pt also at risk for other bacterial infection or other pathogens (fungi, PJP, CMV)  - Oct 02, 2021 found at home minimally responsive, brought to ED, intubated  - 10/02/21 NP and tracheal specimens negative for SARS-CoV2  - 09/08/21 urine legionella antigen positive. PCR of tracheal aspirate positive  Tx: Vancomycin/cefepime/remdesivir 9/25 -> added levofloxacin 9/26 -> cefepime/levofloxacin 9/27    #COVID antigen positive at home 09/05/21  Three negative PCRs while inpatient. Unclear if implies false positive, perhaps resolving infection from weeks prior  - No remdesivir received  ??  Prior infections:  #Fever and leukocytosis, possible UTI 08/2016  #Liver lesions suspected to be abscesses  - Suspected UTI, improved on empiric broad spectrum therapy Vanc/CFP -> doxy  - CT new liver lesions, FDG avid on PET, concerning for metastases  - Liver biopsy 09/2016 nonspecific inflammatory changes suggesting resolving abscess  - MRI 01/2017 improved liver lesions  ??  #RSV infection 02/2018  - Also diagnosed with decompensated diastolic and systolic HF  ??  Antimicrobial Intolerance/allergy  Penicillins - hives (2014), tolerated cephalosporins       RECOMMENDATIONS    Diagnostic  ?? Please f/u: histo Ag, fungitell  ?? Follow up Bcx 9/25  ?? Follow up final respiratory cultures 9/25 (prelim OPF)  ?? If further diarrhea, please send C. Diff and GI pathogen panel  ?? For eventual workup of chronic cough, would like to obtain CT chest  ?? Pending imaging can determine if additional workup required (sputum AFB, bronch, etc.)    Lab monitoring for antimicrobial toxicities  ?? CBC w/ diff and??CMP  at least weekly  ?? Please repeat EKG after starting quinolone therapy    Treatment  Multifocal pneumonia, Legionella antigen positive  ?? Continue cefepime renally dosed equivalent 2g q8h  ?? Continue Levofloxacin, renally dosed equivalent 750 mg daily  ?? STOP vancomycin with negative MRSA nares  ??  Immunosuppression and prophylaxis  ?? IS deferred to transplant team, prefer to continue tacrolimus if SARS-2 positive  ?? Cont ppx with acyclovir           The [facility] ID service will continue to follow.  Please page the ID Transplant/Liquid Oncology Fellow consult at [phone] with questions.  Patient discussed with Dr. Julaine Hua.    Subjective:     Interval History:    History obtained from:patient and nurse.  Events since last encounter: NAEO  Today's HPI for primary problems: Improving vent settings, weaning pressors now nearly off. Mental status improving and considering SBT later. No fevers. Wife confirmed to primary team that antigen test at home had 2 red stripes    Medications:  Antimicrobials:  Anti-infectives (From admission, onward)    Start     Dose/Rate Route Frequency Ordered Stop    09/09/21 1500  levoFLOXacin (LEVAQUIN) 500 mg/100 mL IVPB 500 mg        Followed by Linked Group Details    500 mg  100 mL/hr over 60 Minutes Intravenous Every 24 hours 09/08/21 1402 09/15/21 1459    09/08/21 2200  cefepime (MAXIPIME) 1 g in sodium chloride 0.9 % (NS) 100 mL IVPB-connector bag         1 g  200 mL/hr over 30 Minutes Intravenous Every 8 hours scheduled 09/08/21 1501 09/15/21 2159          Prior/Current immunomodulators:  Tacrolimus 3/2  MMF 250 bid  Prednisone 5    Other medications reviewed.    Objective:     Vital Signs last 24 hours:  Temp:  [35.4 ??C (95.7 ??F)-37 ??C (98.6 ??F)] 35.4 ??C (95.7 ??F)  Heart Rate:  [78-96] 95  SpO2 Pulse:  [77-96] 96  Resp:  [16-29] 16  A BP-2: (87-126)/(47-70) 115/62  MAP:  [62 mmHg-91 mmHg] 85 mmHg  FiO2 (%):  [35 %-40 %] 35 %  SpO2:  [95 %-100 %] 96 %    Physical Exam:   Patient Lines/Drains/Airways Status     Active Active Lines, Drains, & Airways     Name Placement date Placement time Site Days    ETT  7.5 09/15/2021  1337  -- 2    CVC Triple Lumen 09-15-2021 Right Femoral 09/15/2021  1514  Femoral  2    NG/OG Tube Decompression 16 Fr. Center mouth 09-15-21  1500  Center mouth  2    Urethral Catheter Temperature probe 16 Fr. Sep 15, 2021  1602  Temperature probe  1    Peripheral IV September 15, 2021 Left Antecubital September 15, 2021  --  Antecubital  2    Arterial Line 09-15-21 Left Brachial September 15, 2021  1511  Brachial  2    Arteriovenous Fistula - Vein Graft  Access 11/01/18 Right;Upper Arm 11/01/18  --  Arm  1043              Const [x]  vital signs above    []  NAD, non-toxic appearance        Eyes []  Lids normal bilaterally, conjunctiva anicteric and noninjected OU     [] PERRL  [] EOMI  Swollen lids, improving      ENMT [x]  Normal appearance of external nose and ears, no  nasal discharge        [x]  MMM, no lesions on lips or gums []  No thrush, leukoplakia, oral lesions  []  Dentition good []  Edentulous []  Dental caries present  []  Hearing normal  []  TMs with good light reflexes bilaterally         Neck [x]  Neck of normal appearance and trachea midline        []  No thyromegaly, nodules, or tenderness   []  Full neck ROM  RIJ trialysis catheter in place      Lymph [x]  No LAD in neck     []  No LAD in supraclavicular area     []  No LAD in axillae   []  No LAD in epitrochlear chains     []  No LAD in inguinal areas        CV [x]  RRR            [x]  No peripheral edema     []  Pedal pulses intact   []  No abnormal heart sounds appreciated   []  Extremities WWP         Resp [x]  Normal WOB at rest    []  No breathlessness with speaking, no coughing  []  CTA anteriorly    []  CTA bilaterally    Vent sounds anteriorly      GI []  Normal inspection, NTND   []  NABS     []  No umbilical hernia on exam       []  No hepatosplenomegaly     []  Inspection of perineal and perianal areas normal  Transplanted kidney palpable      GU []  Normal external genitalia     [] No urinary catheter present in urethra   []  No CVA tenderness    []  No tenderness over renal allograft        MSK []  No clubbing or cyanosis of hands       []  No vertebral point tenderness  []  No focal tenderness or abnormalities on palpation of joints in RUE, LUE, RLE, or LLE        Skin []  No rashes, lesions, or ulcers of visualized skin     []  Skin warm and dry to palpation   RUE with AVG with thrill, dilated up to shoulder. LUE with graft without thrill           Neuro _ Face expression symmetric  []  Sensation to light touch grossly intact throughout    []  Moves extremities equally    []  No tremor noted        []  CNs II-XII grossly intact     []  DTRs normal and symmetric throughout []  Gait unremarkable  Opens eyes to voice, follows commands intermittently      Psych []  Appropriate affect       []  Fluent speech         []  Attentive, good eye contact  []  Oriented to person, place, time          []  Judgment and insight are appropriate   Sedated          Data for Medical Decision Making     Recent Labs   Lab Units 09/09/21  1517 09/09/21  1252 09/09/21  0931 09/09/21  0433 09/09/21  0221 09/08/21  0914 09/08/21  0534   WBC 10*9/L  --  9.4  --   --  11.6*   < >  --    HEMOGLOBIN g/dL  --  16.1*  --   --  10.0*   < >  --    PLATELET COUNT (1) 10*9/L  --  70*  --   --  86*   < >  --    NEUTRO ABS 10*9/L  --   --   --   --  11.4*   < >  --    LYMPHO ABS 10*9/L  --   --   --   --  0.1*   < >  --    EOSINO ABS 10*9/L  --   --   --   --  0.0   < >  --    BUN mg/dL  --  60*  --   --  81*   < > 90*   CREATININE mg/dL  --  1.61*  --   --  0.96*   < > 5.40*   AST U/L  --   --   --  88*  --   --  90*   ALT U/L  --   --   --  23  --   --  21   BILIRUBIN TOTAL mg/dL  --   --   --  1.9*  --   --  1.9*   ALK PHOS U/L  --   --   --  80  --   --  87   POTASSIUM WHOLE BLOOD mmol/L 4.5 4.3 4.3 4.7*  --    < >  --    POTASSIUM mmol/L  --  4.8  --   --  5.0*   < > 5.2*   MAGNESIUM mg/dL  --  1.6  --   --  1.8   < >  --    PHOSPHORUS mg/dL  --  3.3  --   --  3.7   < >  --    CALCIUM mg/dL  --  8.8  --   --  8.8   < > 8.8   CK TOTAL U/L  --   --  1,205.0*  --   --   --  1,775.0*   CRP mg/L  --   --   --   --   --   --  317.0*    < > = values in this interval not displayed.     I reviewed and noted the following labs:WBC 9.4, Hgb 10.2, Plt 70 (b/l), Cr 3.16    Microbiology:    9/25 Bcx pending  9/25 Ucx pending, UA 3 WBC  9/25 Lower resp cx, pending, OPF  9/25 SARS-CoV-2 negative for NP and trach aspirate  9/25 CrAg negative  9/26 RPP negative  9/26 Legionella Ag (urine) positive  9/26 Legionella PCR positive  9/26 CMV PCR negative    Past cultures were reviewed in Epic and CareEverywhere  Review of cultures with microbiology: I reviewed the specimens with the microbiology team and agree with their assessment.    Imaging:    Independent visualization of images: I independently reviewed the image from (date) and I agree with the findings/interpretation.    Additional Studies:   (09/08/21) EKG [id number]

## 2021-09-09 NOTE — Unmapped (Signed)
Pt off of sedation. RASS -4. MAP > 65 maintained with levo. On ventilator. Minimal urine output via foley. 2 BM.   Problem: Fall Injury Risk  Goal: Absence of Fall and Fall-Related Injury  Outcome: Ongoing - Unchanged  Intervention: Promote Injury-Free Environment  Recent Flowsheet Documentation  Taken 09/08/2021 2000 by Wanda Plump, RN  Safety Interventions:   aspiration precautions   fall reduction program maintained   infection management   bleeding precautions   low bed     Problem: Infection  Goal: Absence of Infection Signs and Symptoms  Outcome: Ongoing - Unchanged  Intervention: Prevent or Manage Infection  Recent Flowsheet Documentation  Taken 09/08/2021 2000 by Wanda Plump, RN  Infection Management: aseptic technique maintained  Isolation Precautions: spec airborne/contact precautions maintained     Problem: Adult Inpatient Plan of Care  Goal: Plan of Care Review  Outcome: Ongoing - Unchanged  Goal: Patient-Specific Goal (Individualized)  Outcome: Ongoing - Unchanged  Goal: Absence of Hospital-Acquired Illness or Injury  Outcome: Ongoing - Unchanged  Intervention: Identify and Manage Fall Risk  Recent Flowsheet Documentation  Taken 09/08/2021 2000 by Wanda Plump, RN  Safety Interventions:   aspiration precautions   fall reduction program maintained   infection management   bleeding precautions   low bed  Intervention: Prevent Skin Injury  Recent Flowsheet Documentation  Taken 09/08/2021 2000 by Wanda Plump, RN  Skin Protection: adhesive use limited  Intervention: Prevent and Manage VTE (Venous Thromboembolism) Risk  Recent Flowsheet Documentation  Taken 09/08/2021 2000 by Wanda Plump, RN  Activity Management: bedrest  Range of Motion: Bilateral Upper and Lower Extremities  Intervention: Prevent Infection  Recent Flowsheet Documentation  Taken 09/08/2021 2000 by Wanda Plump, RN  Infection Prevention: hand hygiene promoted  Goal: Optimal Comfort and Wellbeing  Outcome: Ongoing - Unchanged  Goal: Readiness for Transition of Care  Outcome: Ongoing - Unchanged  Goal: Rounds/Family Conference  Outcome: Ongoing - Unchanged     Problem: Skin Injury Risk Increased  Goal: Skin Health and Integrity  Outcome: Ongoing - Unchanged  Intervention: Optimize Skin Protection  Recent Flowsheet Documentation  Taken 09/09/2021 0000 by Wanda Plump, RN  Head of Bed [facility]) Positioning: HOB at 30-45 degrees  Taken 09/08/2021 2000 by Wanda Plump, RN  Pressure Reduction Techniques: frequent weight shift encouraged  Head of Bed (HOB) Positioning: HOB at 30-45 degrees  Pressure Reduction Devices: heel offloading device utilized  Skin Protection: adhesive use limited     Problem: Dysrhythmia (Heart Failure)  Goal: Stable Heart Rate and Rhythm  Outcome: Ongoing - Unchanged     Problem: Fluid Imbalance (Heart Failure)  Goal: Fluid Balance  Outcome: Ongoing - Unchanged     Problem: Respiratory Compromise (Heart Failure)  Goal: Effective Oxygenation and Ventilation  Outcome: Ongoing - Unchanged  Intervention: Optimize Oxygenation and Ventilation  Recent Flowsheet Documentation  Taken 09/08/2021 2000 by Wanda Plump, RN  Airway/Ventilation Management: airway patency maintained     Problem: Device-Related Complication Risk (Mechanical Ventilation, Invasive)  Goal: Optimal Device Function  Outcome: Ongoing - Unchanged  Intervention: Optimize Device Care and Function  Recent Flowsheet Documentation  Taken 09/08/2021 2000 by Wanda Plump, RN  Aspiration Precautions: respiratory status monitored     Problem: Nutrition Impairment (Mechanical Ventilation, Invasive)  Goal: Optimal Nutrition Delivery  Outcome: Ongoing - Unchanged     Problem: Non-Violent Restraints  Goal: Patient will remain free of restraint events  Outcome: Ongoing - Unchanged  Goal: Patient  will remain free of physical injury  Outcome: Ongoing - Unchanged     Problem: Impaired Wound Healing  Goal: Optimal Wound Healing  Outcome: Ongoing - Unchanged  Intervention: Promote Wound Healing  Recent Flowsheet Documentation  Taken 09/08/2021 2000 by Wanda Plump, RN  Activity Management: bedrest     Problem: Self-Care Deficit  Goal: Improved Ability to Complete Activities of Daily Living  Outcome: Ongoing - Unchanged

## 2021-09-09 NOTE — Unmapped (Signed)
[facility] Evening Summary     Date of Service: 09/08/2021    Interval History: Miguel Ward is a 70 y.o. male with PMHx HTN, 2009 renal transplant (no longer on  iHD), CAD, HLD, HFrEF, NSTEMI, and lupus nephritis presented 9/25 for AMS. When EMS arrived, he was fully unresponsive, and hypoglycemic.  He was given 250 mL of D10, and became semi unconscious. He still only responded to pain and verbal, and was started on levo for SBP 90s. Upon arrival to ED he was intubated for airway protection and hypoxia. CXR c/f covid pna +/- superimposed bact infection in an immunocomp host.    Principal Problem:    Acute respiratory distress syndrome (ARDS) due to COVID-19 virus (CMS-HCC)  Active Problems:    Hypercholesterolemia    Hypertension    History of kidney transplant    Renal cell carcinoma (CMS-HCC)    Immunosuppression-related infectious disease (CMS-HCC)    Bilateral lower extremity edema    Elevated troponin I level    GERD (gastroesophageal reflux disease)    Diastolic heart failure (CMS-HCC)    Acute on chronic HFrEF (heart failure with reduced ejection fraction) (CMS-HCC)    COVID  Resolved Problems:    * No resolved hospital problems. *        Assessment & Plan     N: fent gtt, for RASS 0 to 1-, held since 9/26   P: prvc, min support, DPs low teens on peep 8, wean for sats >94%  CV: levo/vaso for MAP>65, SDS, TTE with EF 30%, reduced RV systolic function , phtn, dd3, no fluids given in ED, trop elevated and peaked at 3900, ekg afib no evid ACS, 2L IVF challenge given escalating pressor doses with desirable response, now down to just norepi for BP support  GI: miralax and ppi, TF  R: baseline Cr 3, CRRT per renal started on 9/26 for clearance  H: heparin per ACS protocol given afib/troponin leak and no contraindications  E: accu check to eval for hypoglycemia with D10% boluses PRN   ID: vanc/cefe, repeat covid PCR nares negative, PCR from tracheal aspirate negative, holding toci and home acyclovir for ppx; urine legionella antigen positive so Levaquin added on 9/26 and clinically is improving   Immuno: hold cellcept/tacr    Critical Care Attestation     This patient is critically ill or injured with the impairment of vital organ systems such that there is a high probability of imminent or life threatening deterioration in the patient's condition. This patient must remain in the ICU for ongoing evaluation of the comprehensive management plan outlined in this note. I directly provided critical care services as documented in this note and the critical care time spent (45  min) is exclusive of separately billable procedures.  In addition to time spent for critical care management, I also provided advance care planning services for 0  minutes (see ACP note for details)???. Total billable critical care time 45  minutes.    Di Kindle Blayn Whetsell, ACNP

## 2021-09-09 NOTE — Unmapped (Signed)
MICU Daily Progress Note     Date of Service: 09/09/2021    Problem List:   Principal Problem:    Acute respiratory distress syndrome (ARDS) due to COVID-19 virus (CMS-HCC)  Active Problems:    Hypercholesterolemia    Hypertension    History of kidney transplant    Renal cell carcinoma (CMS-HCC)    Immunosuppression-related infectious disease (CMS-HCC)    Bilateral lower extremity edema    Elevated troponin I level    GERD (gastroesophageal reflux disease)    Diastolic heart failure (CMS-HCC)    Acute on chronic HFrEF (heart failure with reduced ejection fraction) (CMS-HCC)    COVID  Resolved Problems:    * No resolved hospital problems. *      Interval history: CASH DUCE is a 70 y.o. male renal transplant from lupus nephritis (complex course over last 15 years since transplant followed here including renal cell) here with acute hypoxic respiratory failure, COVID 19 and shock with AKI.      Neurological   #acute metabolic encephalopathy  - neuro checks: q4h, arouses to voice, neuro improving  - analegsia prn  - fent gtt for vent compliance and comfort >> markedly sedate on arrival, wean for RASS 0 to 1-  - spontaneous awakening trial daily  - CAM ICU delirium present: yes  - PT/OT for early mobility as tol when approp  - arouses to voice, expect exam to improve as CRRT clears  Pulmonary   #ARDS, Acute resp failure   - tolerating prvc well, min settings w DP low teens  - cont wean vent as tol per ARDS protocol  - sedation for vent compliance as above w lacrilube OU bid  - goal sats >94%, pao2 >60 pco2, 45-60 , pH>7.25  - pulm toilet as tol, HOB>30deg  - peridex for vap ppx   - Fluid sparing resuscitation give known heart failure and Phtn    Cardiovascular   #septic shock, elevated trop  #HFrEF, ho htn, hld, cad  - arrived from ED on levo 26,   - MAP goals >65  - cont levo for MAP goals,  - POCUS suggests plehtoric IVC, dilated and dysfxnal RV w low EF  - formal echo 9/26 EF @ 30%, 2020 ECHO 40-45%  - cont home regimen asa statin  - hold home regimen metop, vasotec, lasix  - fem line in place unable to trend scvo2/cvp  - start SDS    Renal   #AKI (baseline Cr 2.7-3), AGMA  #hx renal transplant s/t lupus nephritis, c/b rejection and renal cell CA s/p cryo ablation  - foley for accurate I/O during critical illness   - goal euvolemia   - replete electrolytes prn and monitor daily  - cont home regimen magox  - hold home regimen lasix  - started bicarb enteral for acidosis  - f/u transplant recs >> consented for CRRT  - worsening renal fxn, plan to start CRRT, line placed 9/26    Infectious Disease/Autoimmune   #COVID 19 infection  Symptom onset:??9/22  Exposure: unknown  COVID PCR+: pending (home test positive 9/23?)  Day of admission/transfer: 9/25  COVID Specific??Meds:??dec/rem  Vaccination status: full & boosted x once, evushield given 8/17  ??  9/25 Bcx pending  9/25 Ucx pending, UA 3 WBC  9/25 Lower resp cx, pending, OPF  9/25 SARS-CoV-2 negative for NP and trach aspirate  9/25 CrAg negative  9/26 RPP negative  9/26 Legionella Ag (urine) positive  9/26 Legionella PCR positive  9/26  CMV PCR negative  ??  Legionella, Urine  - levofloxacin started 9/26  ??  #CAP  - febrile, leukocytosis   - cont abx regimen vanc/cefe  - pan cx for T>38.2   - f/u cx data pending, f/u fungitel, crypto and resp cx  - hold home regimen acyclovir for ppx  - ICID consulted  - 9/26 chest x-ray diffuse heterogeneous airspace opacities, worse throughout the right lung    Cultures:  Blood Culture, Routine (no units)   Date Value   09/28/21 No Growth at 24 hours   2021/09/28 No Growth at 24 hours     Urine Culture, Comprehensive (no units)   Date Value   2021/09/28 NO GROWTH     Lower Respiratory Culture (no units)   Date Value   Sep 28, 2021 TOO YOUNG TO READ     WBC (10*9/L)   Date Value   09/09/2021 11.6 (H)     WBC, UA (/HPF)   Date Value   28-Sep-2021 <1              FEN/GI   #gerd  - started TF, adv to goal as tol  - ppi for gi ppx  - bowel regimen w miralax    Malnutrition Assessment: Not done yet.  Body mass index is 22.68 kg/m??.          Heme/Coag   #Hypercoagulable state r/t COVID  - HH, plts, coags acceptable  - no evid active bleeding  - SCDs for DVT ppx  - cont prophylactic dosing heparin, stopped 9/27    Endocrine   #hypoglycemic, hypothryoid  - glucose within target range yes  - cont ADI + accu checks   - hold home regimen allopurinol  - cont home regimen synthroid    Integumentary     #  - WOCN consulted for high risk skin assessment No. Reason: no alterations in skin.  - WOCN recs >> pending , agree with assmt and plan   - cont pressure mitigating precautions per skin policy    Prophylaxis/LDA/Restraints/Consults   Can CVC be removed? No: dialysis catheter   Can A-line be removed? No: >moderate dose pressors  Can Foley be removed? No: Need continuous I/O  Mobility plan: Step 1 - Range of motion    Feeding: Trickle feeds, advance as tolerated  Analgesia: Pain adequately controlled  Sedation SAT/SBT: Yes  Thromboembolic ppx: SQ heparin  Head of bed >30 degrees: Yes  Ulcer ppx: Yes, home use continued  Glucose within target range: Yes, in range    Does patient need/have an active type/screen? Yes    RASS at goal? No - adjusting sedation or order to reflect need  [place] Agitation Assessment Scale (RASS) : -1 (09/09/2021  8:00 AM)     Can antipsychotics be stopped? N/A, not on antipsychotics  CAM-ICU Result: Positive (09/08/2021  8:00 PM)      Would hospice care be appropriate for this patient? No, patient improving or expected to improve  Any unaddressed hospice/palliative care needs? no    Patient Lines/Drains/Airways Status     Active Active Lines, Drains, & Airways     Name Placement date Placement time Site Days    ETT  7.5 2021/09/28  1337  -- 1    CVC Triple Lumen 2021/09/28 Right Femoral 28-Sep-2021  1514  Femoral  1    NG/OG Tube Decompression 16 Fr. Center mouth September 28, 2021  1500  Center mouth  1    Urethral Catheter Temperature probe 16 Fr. September 28, 2021  1602  Temperature probe  1    Peripheral IV 06-Oct-2021 Left Antecubital 06-Oct-2021  --  Antecubital  2    Arterial Line 2021-10-06 Left Brachial 10/06/21  1511  Brachial  1    Arteriovenous Fistula - Vein Graft  Access 11/01/18 Right;Upper Arm 11/01/18  --  Arm  1043              Patient Lines/Drains/Airways Status     Active Wounds     Name Placement date Placement time Site Days    Surgical Site 10/21/18 Arm Right 10/21/18  0926  -- 1054    Surgical Site 02/07/19 Arm Right 02/07/19  0854  -- 945                Goals of Care     Code Status: Full Code    Designated Healthcare Decision Maker:  Mr. Belfield current decisional capacity for healthcare decision-making is Full capacity. His designated healthcare decision maker(s) is/are   HCDM (patient stated preference): Cwikla,Katherine - Spouse - [phone].      Subjective     A 70 yo male admitted after found unresponsive and hypoglycemic who became more alert after glucose but not returning to known baseline.  Was intubated in ED to protect airway and transferred to [facility].      Objective     Vitals - past 24 hours  Temp:  [35.6 ??C (96.1 ??F)-37 ??C (98.6 ??F)] 35.9 ??C (96.6 ??F)  Heart Rate:  [78-96] 87  SpO2 Pulse:  [76-94] 83  Resp:  [19-29] 20  FiO2 (%):  [35 %-40 %] 35 %  SpO2:  [95 %-100 %] 98 % Intake/Output  I/O last 3 completed shifts:  In: 3872.9 [I.V.:2001.7; NG/GT:495; IV Piggyback:1376.3]  Out: 103 [Urine:93; Other:10]     Physical Exam:    General: Generally ill appearing  HEENT: Scleral jaundice/edema, round normocephalic  CV: SR-ST with PACs  Pulm: diminished on right  GI: abd round, non tender  MSK: grossly intact, tone intact  Skin: grossly intact, warm  Neuro: withdraws to stimulus, pupils equal round reactive      Continuous Infusions:   ??? heparin (porcine) FOR CRRT 5 Units/kg/hr (09/09/21 1200)   ??? norepinephrine bitartrate-NS 4 mcg/min (09/09/21 0915)   ??? NxStage RFP 400 (+/- BB) 5000 mL - contains 2 mEq/L of potassium     ??? NxStage/multiBic RFP 401 (+/- BB) 5000 mL - contains 4 mEq/L of potassium     ??? vasopressin         Scheduled Medications:   ??? acetaminophen  1,000 mg Enteral tube: gastric  Q8H   ??? aspirin  81 mg Enteral tube: gastric  Daily   ??? atorvastatin  20 mg Enteral tube: gastric  Nightly   ??? calcitrioL  0.25 mcg Enteral tube: gastric  Q MWF   ??? Cefepime  1 g Intravenous [facility]   ??? chlorhexidine  5 mL Mouth BID   ??? esomeprazole  40 mg Enteral tube: gastric  daily   ??? hydrocortisone sod succ  50 mg Intravenous Q6H SCH   ??? flu vacc qs2022-23 6mos up(PF)  0.5 mL Intramuscular During hospitalization   ??? levoFLOXacin  500 mg Intravenous Q24H   ??? levothyroxine  250 mcg Enteral tube: gastric  daily   ??? magnesium oxide  400 mg Enteral tube: gastric  BID   ??? polyethylene glycol  17 g Enteral tube: gastric  Daily   ??? sodium bicarbonate  1,300 mg Enteral tube:  gastric  QID   ??? vancomycin  1,000 mg Intravenous Q24H   ??? white petrolatum-mineral oil  1 application Both Eyes BID       PRN medications:  dextrose in water, heparin (porcine), heparin (porcine)    Data/Imaging Review: Reviewed in Epic and personally interpreted on 09/09/2021. See EMR for detailed results.       Critical Care Attestation     This patient is critically ill or injured with the impairment of vital organ systems such that there is a high probability of imminent or life threatening deterioration in the patient's condition. This patient must remain in the ICU for ongoing evaluation of the comprehensive management plan outlined in this note. I directly provided critical care services as documented in this note and the critical care time spent (55  min) is exclusive of separately billable procedures.  In addition to time spent for critical care management, I also provided advance care planning services for 0  minutes (see ACP note for details)???. Total billable critical care time 55  minutes.    Camren Henthorn Willow Ora, ACNP

## 2021-09-10 LAB — HEPATIC FUNCTION PANEL
ALBUMIN: 1.8 g/dL — ABNORMAL LOW (ref 3.4–5.0)
ALKALINE PHOSPHATASE: 156 U/L — ABNORMAL HIGH (ref 46–116)
ALT (SGPT): 22 U/L (ref 10–49)
AST (SGOT): 113 U/L — ABNORMAL HIGH (ref <=34)
BILIRUBIN DIRECT: 1.8 mg/dL — ABNORMAL HIGH (ref 0.00–0.30)
BILIRUBIN TOTAL: 2.2 mg/dL — ABNORMAL HIGH (ref 0.3–1.2)
PROTEIN TOTAL: 4.8 g/dL — ABNORMAL LOW (ref 5.7–8.2)

## 2021-09-10 LAB — BLOOD GAS CRITICAL CARE PANEL, ARTERIAL
BASE EXCESS ARTERIAL: -1.5 (ref -2.0–2.0)
BASE EXCESS ARTERIAL: -1.6 (ref -2.0–2.0)
BASE EXCESS ARTERIAL: -1.9 (ref -2.0–2.0)
CALCIUM IONIZED ARTERIAL (MG/DL): 4.75 mg/dL (ref 4.40–5.40)
CALCIUM IONIZED ARTERIAL (MG/DL): 4.81 mg/dL (ref 4.40–5.40)
CALCIUM IONIZED ARTERIAL (MG/DL): 4.55 mg/dL (ref 4.40–5.40)
GLUCOSE WHOLE BLOOD: 101 mg/dL (ref 70–179)
GLUCOSE WHOLE BLOOD: 109 mg/dL (ref 70–179)
GLUCOSE WHOLE BLOOD: 109 mg/dL (ref 70–179)
HCO3 ARTERIAL: 22 mmol/L (ref 22–27)
HCO3 ARTERIAL: 22 mmol/L (ref 22–27)
HCO3 ARTERIAL: 22 mmol/L (ref 22–27)
HEMOGLOBIN BLOOD GAS: 9 g/dL — ABNORMAL LOW
HEMOGLOBIN BLOOD GAS: 8.8 g/dL — ABNORMAL LOW
HEMOGLOBIN BLOOD GAS: 8.8 g/dL — ABNORMAL LOW (ref 13.50–17.50)
LACTATE BLOOD ARTERIAL: 2.4 mmol/L — ABNORMAL HIGH (ref <1.3)
LACTATE BLOOD ARTERIAL: 2.5 mmol/L — ABNORMAL HIGH (ref <1.3)
LACTATE BLOOD ARTERIAL: 2.6 mmol/L — ABNORMAL HIGH (ref <1.3)
O2 SATURATION ARTERIAL: 95.2 % (ref 94.0–100.0)
O2 SATURATION ARTERIAL: 96.7 % (ref 94.0–100.0)
O2 SATURATION ARTERIAL: 96.6 % (ref 94.0–100.0)
PCO2 ARTERIAL: 32 mmHg — ABNORMAL LOW (ref 35.0–45.0)
PCO2 ARTERIAL: 32.9 mmHg — ABNORMAL LOW (ref 35.0–45.0)
PCO2 ARTERIAL: 32.1 mmHg — ABNORMAL LOW (ref 35.0–45.0)
PH ARTERIAL: 7.45 (ref 7.35–7.45)
PH ARTERIAL: 7.44 (ref 7.35–7.45)
PH ARTERIAL: 7.44 (ref 7.35–7.45)
PO2 ARTERIAL: 70.4 mmHg — ABNORMAL LOW (ref 80.0–110.0)
PO2 ARTERIAL: 81.1 mmHg (ref 80.0–110.0)
PO2 ARTERIAL: 76.2 mmHg — ABNORMAL LOW (ref 80.0–110.0)
POTASSIUM WHOLE BLOOD: 4.7 mmol/L — ABNORMAL HIGH (ref 3.4–4.6)
POTASSIUM WHOLE BLOOD: 4.6 mmol/L (ref 3.4–4.6)
POTASSIUM WHOLE BLOOD: 4 mmol/L (ref 3.4–4.6)
SODIUM WHOLE BLOOD: 134 mmol/L — ABNORMAL LOW (ref 135–145)
SODIUM WHOLE BLOOD: 134 mmol/L — ABNORMAL LOW (ref 135–145)
SODIUM WHOLE BLOOD: 133 mmol/L — ABNORMAL LOW (ref 135–145)

## 2021-09-10 LAB — MAGNESIUM
MAGNESIUM: 1.6 mg/dL (ref 1.6–2.6)
MAGNESIUM: 2 mg/dL (ref 1.6–2.6)
MAGNESIUM: 1.9 mg/dL (ref 1.6–2.6)

## 2021-09-10 LAB — BASIC METABOLIC PANEL
ANION GAP: 8 mmol/L (ref 5–14)
ANION GAP: 8 mmol/L (ref 5–14)
ANION GAP: 9 mmol/L (ref 5–14)
BLOOD UREA NITROGEN: 46 mg/dL — ABNORMAL HIGH (ref 9–23)
BLOOD UREA NITROGEN: 41 mg/dL — ABNORMAL HIGH (ref 9–23)
BLOOD UREA NITROGEN: 36 mg/dL — ABNORMAL HIGH (ref 9–23)
BUN / CREAT RATIO: 19
BUN / CREAT RATIO: 20
BUN / CREAT RATIO: 19
CALCIUM: 8.3 mg/dL — ABNORMAL LOW (ref 8.7–10.4)
CALCIUM: 8.3 mg/dL — ABNORMAL LOW (ref 8.7–10.4)
CALCIUM: 8.3 mg/dL — ABNORMAL LOW (ref 8.7–10.4)
CHLORIDE: 102 mmol/L (ref 98–107)
CHLORIDE: 101 mmol/L (ref 98–107)
CHLORIDE: 104 mmol/L (ref 98–107)
CO2: 22 mmol/L (ref 20.0–31.0)
CO2: 24 mmol/L (ref 20.0–31.0)
CO2: 23 mmol/L (ref 20.0–31.0)
CREATININE: 2.36 mg/dL — ABNORMAL HIGH
CREATININE: 2.09 mg/dL — ABNORMAL HIGH
CREATININE: 1.92 mg/dL — ABNORMAL HIGH
EGFR CKD-EPI (2021) MALE: 29 mL/min/{1.73_m2} — ABNORMAL LOW (ref >=60)
EGFR CKD-EPI (2021) MALE: 34 mL/min/{1.73_m2} — ABNORMAL LOW (ref >=60)
EGFR CKD-EPI (2021) MALE: 38 mL/min/{1.73_m2} — ABNORMAL LOW (ref >=60)
GLUCOSE RANDOM: 100 mg/dL (ref 70–179)
GLUCOSE RANDOM: 119 mg/dL (ref 70–179)
GLUCOSE RANDOM: 113 mg/dL — ABNORMAL HIGH (ref 70–99)
POTASSIUM: 4.9 mmol/L — ABNORMAL HIGH (ref 3.4–4.8)
POTASSIUM: 4.6 mmol/L (ref 3.4–4.8)
POTASSIUM: 4.7 mmol/L (ref 3.4–4.8)
SODIUM: 132 mmol/L — ABNORMAL LOW (ref 135–145)
SODIUM: 133 mmol/L — ABNORMAL LOW (ref 135–145)
SODIUM: 136 mmol/L (ref 135–145)

## 2021-09-10 LAB — CBC W/ AUTO DIFF
BASOPHILS ABSOLUTE COUNT: 0 10*9/L (ref 0.0–0.1)
BASOPHILS RELATIVE PERCENT: 0.4 %
EOSINOPHILS ABSOLUTE COUNT: 0 10*9/L (ref 0.0–0.5)
EOSINOPHILS RELATIVE PERCENT: 0.3 %
HEMATOCRIT: 28.4 % — ABNORMAL LOW (ref 39.0–48.0)
HEMOGLOBIN: 9.4 g/dL — ABNORMAL LOW (ref 12.9–16.5)
LYMPHOCYTES ABSOLUTE COUNT: 0.1 10*9/L — ABNORMAL LOW (ref 1.1–3.6)
LYMPHOCYTES RELATIVE PERCENT: 0.7 %
MEAN CORPUSCULAR HEMOGLOBIN CONC: 33.1 g/dL (ref 32.0–36.0)
MEAN CORPUSCULAR HEMOGLOBIN: 27.5 pg (ref 25.9–32.4)
MEAN CORPUSCULAR VOLUME: 83.1 fL (ref 77.6–95.7)
MEAN PLATELET VOLUME: 11.5 fL — ABNORMAL HIGH (ref 6.8–10.7)
MONOCYTES ABSOLUTE COUNT: 0.2 10*9/L — ABNORMAL LOW (ref 0.3–0.8)
MONOCYTES RELATIVE PERCENT: 1.5 %
NEUTROPHILS ABSOLUTE COUNT: 12.6 10*9/L — ABNORMAL HIGH (ref 1.8–7.8)
NEUTROPHILS RELATIVE PERCENT: 97.1 %
PLATELET COUNT: 88 10*9/L — ABNORMAL LOW (ref 150–450)
RED BLOOD CELL COUNT: 3.41 10*12/L — ABNORMAL LOW (ref 4.26–5.60)
RED CELL DISTRIBUTION WIDTH: 15.3 % — ABNORMAL HIGH (ref 12.2–15.2)
WBC ADJUSTED: 13 10*9/L — ABNORMAL HIGH (ref 3.6–11.2)

## 2021-09-10 LAB — CBC
HEMATOCRIT: 26.1 % — ABNORMAL LOW (ref 39.0–48.0)
HEMATOCRIT: 26.3 % — ABNORMAL LOW (ref 39.0–48.0)
HEMOGLOBIN: 8.5 g/dL — ABNORMAL LOW (ref 12.9–16.5)
HEMOGLOBIN: 8.5 g/dL — ABNORMAL LOW (ref 12.9–16.5)
MEAN CORPUSCULAR HEMOGLOBIN CONC: 32.5 g/dL (ref 32.0–36.0)
MEAN CORPUSCULAR HEMOGLOBIN CONC: 32.2 g/dL (ref 32.0–36.0)
MEAN CORPUSCULAR HEMOGLOBIN: 27 pg (ref 25.9–32.4)
MEAN CORPUSCULAR HEMOGLOBIN: 27.1 pg (ref 25.9–32.4)
MEAN CORPUSCULAR VOLUME: 83.1 fL (ref 77.6–95.7)
MEAN CORPUSCULAR VOLUME: 84.3 fL (ref 77.6–95.7)
MEAN PLATELET VOLUME: 11.4 fL — ABNORMAL HIGH (ref 6.8–10.7)
MEAN PLATELET VOLUME: 11.8 fL — ABNORMAL HIGH (ref 6.8–10.7)
PLATELET COUNT: 80 10*9/L — ABNORMAL LOW (ref 150–450)
PLATELET COUNT: 78 10*9/L — ABNORMAL LOW (ref 150–450)
RED BLOOD CELL COUNT: 3.13 10*12/L — ABNORMAL LOW (ref 4.26–5.60)
RED BLOOD CELL COUNT: 3.12 10*12/L — ABNORMAL LOW (ref 4.26–5.60)
RED CELL DISTRIBUTION WIDTH: 15.5 % — ABNORMAL HIGH (ref 12.2–15.2)
RED CELL DISTRIBUTION WIDTH: 15.9 % — ABNORMAL HIGH (ref 12.2–15.2)
WBC ADJUSTED: 16.1 10*9/L — ABNORMAL HIGH (ref 3.6–11.2)
WBC ADJUSTED: 18.9 10*9/L — ABNORMAL HIGH (ref 3.6–11.2)

## 2021-09-10 LAB — PHOSPHORUS
PHOSPHORUS: 2.8 mg/dL (ref 2.4–5.1)
PHOSPHORUS: 2.3 mg/dL — ABNORMAL LOW (ref 2.4–5.1)
PHOSPHORUS: 2.3 mg/dL — ABNORMAL LOW (ref 2.4–5.1)

## 2021-09-10 LAB — TACROLIMUS LEVEL, TIMED: TACROLIMUS BLOOD: 7.8 ng/mL

## 2021-09-10 MED ADMIN — levoFLOXacin (LEVAQUIN) 500 mg/100 mL IVPB 500 mg: 500 mg | INTRAVENOUS | @ 20:00:00 | Stop: 2021-09-15

## 2021-09-10 MED ADMIN — NxStage/multiBic RFP 401 (+/- BB) 5000 mL - contains 4 mEq/L of potassium dialysis solution 5,000 mL: 5000 mL | INTRAVENOUS_CENTRAL | @ 22:00:00

## 2021-09-10 MED ADMIN — hydrocortisone sod succ (Solu-CORTEF) injection 50 mg: 50 mg | INTRAVENOUS | @ 10:00:00

## 2021-09-10 MED ADMIN — cefepime (MAXIPIME) 1 g in sodium chloride 0.9 % (NS) 100 mL IVPB-connector bag: 1 g | INTRAVENOUS | @ 10:00:00 | Stop: 2021-09-15

## 2021-09-10 MED ADMIN — calcitrioL (ROCALTROL) capsule 0.25 mcg: .25 ug | GASTROENTERAL | @ 13:00:00

## 2021-09-10 MED ADMIN — norepinephrine 8 mg in dextrose 5 % 250 mL (32 mcg/mL) infusion PMB: 0-30 ug/min | INTRAVENOUS | @ 06:00:00

## 2021-09-10 MED ADMIN — acetaminophen (TYLENOL) tablet 1,000 mg: 1000 mg | GASTROENTERAL | @ 13:00:00 | Stop: 2021-10-07

## 2021-09-10 MED ADMIN — magnesium oxide (MAG-OX) tablet 400 mg: 400 mg | GASTROENTERAL | @ 02:00:00

## 2021-09-10 MED ADMIN — acetaminophen (TYLENOL) tablet 1,000 mg: 1000 mg | GASTROENTERAL | @ 06:00:00 | Stop: 2021-10-07

## 2021-09-10 MED ADMIN — white petrolatum-mineral oil (SOOTHE PM) 80-20 % ophthalmic ointment 1 application: 1 | OPHTHALMIC | @ 02:00:00

## 2021-09-10 MED ADMIN — atorvastatin (LIPITOR) tablet 20 mg: 20 mg | GASTROENTERAL | @ 02:00:00

## 2021-09-10 MED ADMIN — hydrocortisone sod succ (Solu-CORTEF) injection 50 mg: 50 mg | INTRAVENOUS | @ 22:00:00

## 2021-09-10 MED ADMIN — esomeprazole (NexIUM) granules 40 mg: 40 mg | GASTROENTERAL | @ 10:00:00

## 2021-09-10 MED ADMIN — aspirin chewable tablet 81 mg: 81.0 mg | GASTROENTERAL | @ 13:00:00

## 2021-09-10 MED ADMIN — norepinephrine 8 mg in dextrose 5 % 250 mL (32 mcg/mL) infusion PMB: 0-30 ug/min | INTRAVENOUS | @ 23:00:00

## 2021-09-10 MED ADMIN — hydrocortisone sod succ (Solu-CORTEF) injection 50 mg: 50 mg | INTRAVENOUS | @ 04:00:00

## 2021-09-10 MED ADMIN — polyethylene glycol (MIRALAX) packet 17 g: 17 g | GASTROENTERAL | @ 13:00:00

## 2021-09-10 MED ADMIN — white petrolatum-mineral oil (SOOTHE PM) 80-20 % ophthalmic ointment 1 application: 1 | OPHTHALMIC | @ 13:00:00

## 2021-09-10 MED ADMIN — heparin FOR CRRT 25,000 Units/250 mL (100 units/mL) in 0.45% NaCl infusion: 5 [IU]/kg/h | INTRAVENOUS | @ 22:00:00

## 2021-09-10 MED ADMIN — NxStage/multiBic RFP 401 (+/- BB) 5000 mL - contains 4 mEq/L of potassium dialysis solution 5,000 mL: 5000 mL | INTRAVENOUS_CENTRAL | @ 19:00:00

## 2021-09-10 MED ADMIN — levothyroxine (SYNTHROID) tablet 250 mcg: 250 ug | GASTROENTERAL | @ 10:00:00

## 2021-09-10 MED ADMIN — hydrocortisone sod succ (Solu-CORTEF) injection 50 mg: 50 mg | INTRAVENOUS | @ 17:00:00

## 2021-09-10 MED ADMIN — cefepime (MAXIPIME) 1 g in sodium chloride 0.9 % (NS) 100 mL IVPB-connector bag: 1 g | INTRAVENOUS | @ 19:00:00 | Stop: 2021-09-15

## 2021-09-10 MED ADMIN — acetaminophen (TYLENOL) tablet 1,000 mg: 1000 mg | GASTROENTERAL | @ 22:00:00 | Stop: 2021-10-07

## 2021-09-10 MED ADMIN — chlorhexidine (PERIDEX) 0.12 % solution 5 mL: 5 mL | OROMUCOSAL

## 2021-09-10 MED ADMIN — magnesium oxide (MAG-OX) tablet 400 mg: 400 mg | GASTROENTERAL | @ 13:00:00

## 2021-09-10 MED ADMIN — albumin human 5 % 5 % bottle: @ 02:00:00 | Stop: 2021-09-09

## 2021-09-10 MED ADMIN — magnesium sulfate 2gm/50mL IVPB: 2 g | INTRAVENOUS | @ 13:00:00 | Stop: 2021-09-10

## 2021-09-10 MED ADMIN — cefepime (MAXIPIME) 1 g in sodium chloride 0.9 % (NS) 100 mL IVPB-connector bag: 1 g | INTRAVENOUS | @ 02:00:00 | Stop: 2021-09-15

## 2021-09-10 NOTE — Unmapped (Signed)
Respiratory Safety Check    Date: 09/10/2021     PPV Device at bedside:   [x]  Yes  []  Replaced  []  N/A    PPV mask at bedside:  [x]  Yes  []  Replaced  []  N/A    Checked humidifier, inspiratory limb, and expiratory limb:   [x]  Yes  []  Replaced  []  N/A    Backup trachs and supplies available at bedside:  []  Yes  []  Replaced  [x]  N/A    Documented trach airway form visible at bedside:   []  Yes  []  Replaced  [x]  N/A    iNO connected with PPV device:  []  Yes  []  Replaced  [x]  N/A

## 2021-09-10 NOTE — Unmapped (Signed)
Respiratory Safety Check    Date: 09/10/2021     PPV Device at bedside:   [x]  Yes  []  Replaced  []  N/A    PPV mask at bedside:  [x]  Yes  []  Replaced  []  N/A    Checked humidifier, inspiratory limb, and expiratory limb:   []  Yes  []  Replaced  []  N/A    Backup trachs and supplies available at bedside:  []  Yes  []  Replaced  [x]  N/A    Documented trach airway form visible at bedside:   []  Yes  []  Replaced  []  N/A    iNO connected with PPV device:  []  Yes  []  Replaced  [x]  N/A

## 2021-09-10 NOTE — Unmapped (Signed)
[facility] Nephrology Continuous Renal Replacement Therapy Procedure Note     09/09/21    Miguel Ward was seen and examined on CRRT    CHIEF COMPLAINT: Acute Kidney Disease    INTERVAL HISTORY: Remains intubated and on pressors.    CURRENT DIALYSIS PRESCRIPTION:  Device: CRRT Device: NxStage  Therapy fluid: Therapy Fluid : NxStage RFP 401 - Contains 4 mEq/L KCL  Therapy fluid rate: Therapy Fluid Rate (L/hr): 1.8 L/hr  Blood flow rate: Blood Pump Rate (mL/min): 250 mL/min  Fluid removal rate: Hourly Fluid Removal Rate (mL/hr): 50 mL/hr    PHYSICAL EXAM:  Vitals:  Temp:  [35.4 ??C (95.7 ??F)-37.2 ??C (99 ??F)] 37.2 ??C (99 ??F)  Core Temp:  [37 ??C (98.6 ??F)] 37 ??C (98.6 ??F)  Heart Rate:  [86-122] 105  SpO2 Pulse:  [83-117] 103  MAP:  [53 mmHg-91 mmHg] 61 mmHg  A BP-2: (82-131)/(36-72) 88/47  MAP:  [53 mmHg-91 mmHg] 61 mmHg    In/Outs:    Intake/Output Summary (Last 24 hours) at 09/10/2021 1023  Last data filed at 09/10/2021 0800  Gross per 24 hour   Intake 2257.56 ml   Output 1198 ml   Net 1059.56 ml        Weights:  Admission Weight: 81.6 kg (180 lb)  Last documented Weight: 71.7 kg (158 lb 1.1 oz)  Weight Change from Previous Day: No weight listed for specified days    Assessment:   General: Appearing ill  Pulmonary: rales and rhonchi  Cardiovascular: regular rate and rhythm  Extremities: 1+  edema  Access: Right IJ non-tunneled catheter     LAB DATA:  Lab Results   Component Value Date    NA 134 (L) 09/10/2021    K 4.6 09/10/2021    CL 102 09/10/2021    CO2 22.0 09/10/2021    BUN 46 (H) 09/10/2021    CREATININE 2.36 (H) 09/10/2021      Lab Results   Component Value Date    HCT 28.4 (L) 09/10/2021    HGB 9.4 (L) 09/10/2021    WBC 13.0 (H) 09/10/2021        ASSESSMENT/PLAN:  Acute Kidney Disease on Continuous Renal Replacement Therapy:  - UF goal: 10 mL/hr as tolerated  - Renally dose all medications    Drinda Butts, MD  [facility] Division of Nephrology & Hypertension

## 2021-09-10 NOTE — Unmapped (Signed)
MICU Daily Progress Note     Date of Service: 09/10/2021    Problem List:   Principal Problem:    Acute respiratory distress syndrome (ARDS) due to COVID-19 virus (CMS-HCC)  Active Problems:    Hypercholesterolemia    Hypertension    History of kidney transplant    Renal cell carcinoma (CMS-HCC)    Immunosuppression-related infectious disease (CMS-HCC)    Bilateral lower extremity edema    Elevated troponin I level    GERD (gastroesophageal reflux disease)    Diastolic heart failure (CMS-HCC)    Acute on chronic HFrEF (heart failure with reduced ejection fraction) (CMS-HCC)    COVID  Resolved Problems:    * No resolved hospital problems. *      Interval history: Miguel Ward is a 70 y.o. male renal transplant from lupus nephritis (complex course over last 15 years since transplant followed here including renal cell) here with acute hypoxic respiratory failure, COVID 19 and shock with AKI.    Interval events:  - able to have UF at 150 for part of day yesterday with less pressor needs, but in afternoon, needed increased levo and UF dropped to 10   - bleeding from site of fem line removal and tx 1 unit PRBC overnight      Neurological   #acute metabolic encephalopathy  - neuro checks: q4h, arouses to voice, neuro improving  - analegsia prn  - arouses to voice, and intermittently FC   Pulmonary   #ARDS, Acute resp failure   - tolerating PSV and passed SBT, but holding off on extubation until MS improves with better cough.      Cardiovascular   #septic shock, elevated trop  #HFrEF, ho htn, hld, cad  - arrived from ED on levo 26--> weaned to 2 on 9/27 but back to 8 this morning   - MAP goals >65  - formal echo 9/26 EF @ 30%, 2020 ECHO 40-45%  - cont home regimen asa statin  - hold home regimen metop, vasotec, lasix  - SDS 9/26-->     Renal   #AKI (baseline Cr 2.7-3), AGMA  #hx renal transplant s/t lupus nephritis, c/b rejection and renal cell CA s/p cryo ablation  - foley for accurate I/O during critical illness - worsening renal fxn and CRRT started 9/26. UF at 50, with goal evan   - resume tac when level improves with goal of 2-4.  Hold cellcept     Infectious Disease/Autoimmune   #COVID 19 infection  Symptom onset:??9/22  Exposure: unknown  COVID PCR+: neg x 3  (home test positive 9/23?)  Day of admission/transfer: 9/25  COVID Specific??Meds:??none given   Vaccination status: full & boosted x once, evushield given 8/17  3 PCRs since admission negative and taken off precautions   ??  9/25 Bcx neg   9/25 Ucx pending, UA 3 WBC  9/25 Lower resp cx, pending, OPF  9/25 SARS-CoV-2 negative for NP and trach aspirate  9/25 CrAg negative  9/26 RPP negative  9/26 Legionella Ag (urine) positive  9/26 Legionella PCR positive  9/26 CMV PCR negative  ??  Multifocal PNA:   - started on vanco/cefepime on 9/25  - MRSA screen neg and vanco stopped 9/27  - cont cefepime 9/25-->     Legionella, PNA   - levofloxacin started 9/26  ??  Cultures:  Blood Culture, Routine (no units)   Date Value   09/05/2021 No Growth at 48 hours   08/24/2021 No Growth at 48  hours     Urine Culture, Comprehensive (no units)   Date Value   09/06/2021 NO GROWTH     Lower Respiratory Culture (no units)   Date Value   09/11/2021 TOO YOUNG TO READ     WBC (10*9/L)   Date Value   09/10/2021 13.0 (H)     WBC, UA (/HPF)   Date Value   08/20/2021 <1              FEN/GI   #gerd  - started TF, adv to goal as tol  - ppi for gi ppx  - bowel regimen w miralax    Malnutrition Assessment: Not done yet.  Body mass index is 22.68 kg/m??.          Heme/Coag     - was on heparin gtt for NSTEMI and afib- stopped 9/27  - cont ppx    Endocrine   #hypoglycemic, hypothryoid  - glucose within target range yes  - cont ADI + accu checks   - hold home regimen allopurinol  - cont home regimen synthroid    Integumentary     #  - WOCN consulted for high risk skin assessment No. Reason: no alterations in skin.  - WOCN recs >> pending , agree with assmt and plan   - cont pressure mitigating precautions per skin policy    Prophylaxis/LDA/Restraints/Consults   Can CVC be removed? No: dialysis catheter   Can A-line be removed? No: >moderate dose pressors  Can Foley be removed? No: Need continuous I/O  Mobility plan: Step 1 - Range of motion    Feeding: Trickle feeds, advance as tolerated  Analgesia: Pain adequately controlled  Sedation SAT/SBT: Yes  Thromboembolic ppx: SQ heparin  Head of bed >30 degrees: Yes  Ulcer ppx: Yes, home use continued  Glucose within target range: Yes, in range    Does patient need/have an active type/screen? Yes    RASS at goal? No - adjusting sedation or order to reflect need  [place] Agitation Assessment Scale (RASS) : -2 (09/10/2021  8:00 AM)     Can antipsychotics be stopped? N/A, not on antipsychotics  CAM-ICU Result: Positive (09/10/2021  8:00 AM)      Would hospice care be appropriate for this patient? No, patient improving or expected to improve  Any unaddressed hospice/palliative care needs? no    Patient Lines/Drains/Airways Status     Active Active Lines, Drains, & Airways     Name Placement date Placement time Site Days    ETT  7.5 08/23/2021  1337  -- 2    Hemodialysis Catheter With Distal Infusion Port 09/08/21 Right Internal jugular 09/08/21  1600  Internal jugular  1    NG/OG Tube Decompression 16 Fr. Center mouth 08/24/2021  1500  Center mouth  2    Urethral Catheter Temperature probe 16 Fr. 08/14/2021  1602  Temperature probe  2    Peripheral IV 08/21/2021 Left Antecubital 08/25/2021  --  Antecubital  3    Arterial Line 08/17/2021 Left Brachial 09/09/2021  1511  Brachial  2    Arteriovenous Fistula - Vein Graft  Access 11/01/18 Right;Upper Arm 11/01/18  --  Arm  1044              Patient Lines/Drains/Airways Status     Active Wounds     Name Placement date Placement time Site Days    Surgical Site 10/21/18 Arm Right 10/21/18  0926  -- 1055    Surgical Site 02/07/19  Arm Right 02/07/19  0854  -- 946    Wound 09/09/21 Skin Tear Buttocks Right 09/09/21  2130  Buttocks  less than 1 Goals of Care     Code Status: Full Code    Public relations account executive Maker:  Mr. Searls current decisional capacity for healthcare decision-making is Full capacity. His designated healthcare decision maker(s) is/are   HCDM (patient stated preference): Wyss,Miguel Ward.      Subjective   intubated    Objective     Vitals - past 24 hours  Temp:  [35.4 ??C (95.7 ??F)-37.1 ??C (98.8 ??F)] 37.1 ??C (98.8 ??F)  Heart Rate:  [86-122] 99  SpO2 Pulse:  [83-117] 94  Resp:  [16-31] 27  FiO2 (%):  [35 %] 35 %  SpO2:  [92 %-100 %] 95 % Intake/Output  I/O last 3 completed shifts:  In: 2405.1 [I.V.:810.1; Blood:475; NG/GT:920; IV Piggyback:200]  Out: 1345 [Urine:48; Other:1297]     Physical Exam:    General: thin elderly male, intubated, in NAD   HEENT: PERRL  CV: RRR, no m/r/g   Pulm: diminished on right, no wheezing, rhonchi   GI: abd round, non tender  MSK: grossly intact, tone intact  Skin: grossly intact, warm  Neuro: mae spontaneously, opens eyes to voice.  Intermittently FC       Continuous Infusions:   ??? heparin (porcine) FOR CRRT 5 Units/kg/hr (09/10/21 1000)   ??? norepinephrine bitartrate-NS 8 mcg/min (09/10/21 1000)   ??? NxStage RFP 400 (+/- BB) 5000 mL - contains 2 mEq/L of potassium     ??? NxStage/multiBic RFP 401 (+/- BB) 5000 mL - contains 4 mEq/L of potassium     ??? vasopressin         Scheduled Medications:   ??? acetaminophen  1,000 mg Enteral tube: gastric  Q8H   ??? aspirin  81 mg Enteral tube: gastric  Daily   ??? atorvastatin  20 mg Enteral tube: gastric  Nightly   ??? calcitrioL  0.25 mcg Enteral tube: gastric  Q MWF   ??? Cefepime  1 g Intravenous [facility]   ??? chlorhexidine  5 mL Mouth BID   ??? esomeprazole  40 mg Enteral tube: gastric  daily   ??? hydrocortisone sod succ  50 mg Intravenous Q6H SCH   ??? flu vacc qs2022-23 6mos up(PF)  0.5 mL Intramuscular During hospitalization   ??? levoFLOXacin  500 mg Intravenous Q24H   ??? levothyroxine  250 mcg Enteral tube: gastric  daily   ??? magnesium oxide 400 mg Enteral tube: gastric  BID   ??? polyethylene glycol  17 g Enteral tube: gastric  Daily   ??? white petrolatum-mineral oil  1 application Both Eyes BID       PRN medications:  dextrose in water, heparin (porcine), heparin (porcine), ondansetron    Data/Imaging Review: Reviewed in Epic and personally interpreted on 09/10/2021. See EMR for detailed results.       Critical Care Attestation     This patient is critically ill or injured with the impairment of vital organ systems such that there is a high probability of imminent or life threatening deterioration in the patient's condition. This patient must remain in the ICU for ongoing evaluation of the comprehensive management plan outlined in this note. I directly provided critical care services as documented in this note and the critical care time spent (50  min) is exclusive of separately billable procedures.  In addition to time spent for critical care  management, I also provided advance care planning services for 0  minutes (see ACP note for details)???. Total billable critical care time 50  minutes.    Hammond Obeirne Paulino Door, PA

## 2021-09-10 NOTE — Unmapped (Signed)
Patient is currently admitted in the hospital. We will cancel shipment and reschedule phone call for next week.

## 2021-09-10 NOTE — Unmapped (Signed)
MICU Evening Summary     Date of Service: 09/09/2021    Interval History: Miguel Ward is a 70 y.o. male with PMHx HTN, 2009 renal transplant (no longer on  iHD), CAD, HLD, HFrEF, NSTEMI, and lupus nephritis presented 9/25 for AMS. When EMS arrived, he was fully unresponsive, and hypoglycemic.  He was given 250 mL of D10, and became semi unconscious. He still only responded to pain and verbal, and was started on levo for SBP 90s. Upon arrival to ED he was intubated for airway protection and hypoxia. CXR c/f covid pna +/- superimposed bact infection in an immunocomp host.    Principal Problem:    Acute respiratory distress syndrome (ARDS) due to COVID-19 virus (CMS-HCC)  Active Problems:    Hypercholesterolemia    Hypertension    History of kidney transplant    Renal cell carcinoma (CMS-HCC)    Immunosuppression-related infectious disease (CMS-HCC)    Bilateral lower extremity edema    Elevated troponin I level    GERD (gastroesophageal reflux disease)    Diastolic heart failure (CMS-HCC)    Acute on chronic HFrEF (heart failure with reduced ejection fraction) (CMS-HCC)    COVID  Resolved Problems:    * No resolved hospital problems. *        Assessment & Plan     N: fent PRN for RASS 0 to 1-; follows commands  P: prvc, min support, wean for sats >94%  CV: levo for MAP>65, SDS, echo 2020 w EF 40-45%, phtn, dd3, no fluids given in ED, trop elevated-trending, ekg afib no evid ACS, repeat echo EF 30%  GI: mira and ppi, trickle TF, now advance as tolerated  R: baseline Cr 3, CRRT UF 10 d/t hypotension  H: heparin gtt off 9/27, fem line removed w hemostasis for 2hr then rebled w significant amt blood pooled in bed, pressure held total 30 min w hemostasis, hgb min changed yet 1u prbc transfused given inc pressor during event  E: accu check to eval for hypoglycemia with D10% boluses PRN  ID: cefe/levaquin, repeat covid PCR nares (-), PCR from tracheal aspirate(-), holding toci/rem/decadron and home acyclovir for ppx, Legionnaires's antigen + urine 9/26 as well as PCR + legionella pneumophila  Immuno: hold cellcept/tac    Critical Care Attestation     This patient is critically ill or injured with the impairment of vital organ systems such that there is a high probability of imminent or life threatening deterioration in the patient's condition. This patient must remain in the ICU for ongoing evaluation of the comprehensive management plan outlined in this note. I directly provided critical care services as documented in this note and the critical care time spent (45  min) is exclusive of separately billable procedures.  In addition to time spent for critical care management, I also provided advance care planning services for 0  minutes (see ACP note for details)???. Total billable critical care time 45  minutes.    Di Kindle Jo Cerone, ACNP

## 2021-09-10 NOTE — Unmapped (Signed)
WOCN Consult Services                                                 Wound Evaluation    Reason for Consult:   - Initial  - Pressure Injury    Problem List:   Principal Problem:    Acute respiratory distress syndrome (ARDS) due to COVID-19 virus (CMS-HCC)  Active Problems:    Hypercholesterolemia    Hypertension    History of kidney transplant    Renal cell carcinoma (CMS-HCC)    Immunosuppression-related infectious disease (CMS-HCC)    Bilateral lower extremity edema    Elevated troponin I level    GERD (gastroesophageal reflux disease)    Diastolic heart failure (CMS-HCC)    Acute on chronic HFrEF (heart failure with reduced ejection fraction) (CMS-HCC)    COVID    Assessment: Consulted for wound on sacrum concerning for pressure injury. Per EMR, patient admitted on 09/25/2021 with a history of renal transplant from lupus nephritis (complex course over last 15 years since transplant followed here including renal cell) here with acute hypoxic respiratory failure, COVID 19 and shock with AKI. Per H&P, patient found unresponsive, hypoxic and hypoglycemic at home.     Patient assessed in MICU. Per RN, he has had multiple episodes of large volume stool incontinence. He is on a bowel regimen. Stool incontinence care provided.   The right inner aspect of his sacrum with darker tissue compared to surrounding. There is an epidermal lift with dark red tissue, non blanching. Unable to assess for pain with palpation. Concern that wound is a pressure injury, deep tissue likely occurring PTA given his condition he was found in the home. Sacral silicone bordered foam dressing applied. Patient turned with pillows.        09/10/21 1457   Wound 09/09/21 Other (comment) Sacrum Right dark red nonblanching with dark skin surrounding   Date First Assessed/Time First Assessed: 09/09/21 2130   Present on Hospital Admission: Yes  Primary Wound Type: (c) Other (comment)  Location: Sacrum  Wound Location Orientation: Right  Wound Description (Comments): dark red nonblanching with dark sk...   Wound Image     Dressing Status      Changed   Wound Length (cm) 4 cm   Wound Width (cm) 6 cm   Wound Depth (cm) 0.1 cm   Wound Surface Area (cm^2) 24 cm^2   Wound Volume (cm^3) 2.4 cm^3   Wound Bed Red  (dark red with darker surrounding tissue)   Odor None   Peri-wound Assessment      Intact   Exudate Type      Sero-sanguineous   Exudate Amnt      Small   Tunneling      No   Undermining     No   Treatments Cleansed/Irrigation   Dressing Silicone foam bordered dressing           Continence Status:   Incontinence of bladder: Foley in place  Incontinent of bowel: incontinence pad    Moisture Associated Skin Damage:   - none noted     Lab Results   Component Value Date    WBC 16.1 (H) 09/10/2021    HGB 8.5 (L) 09/10/2021    HCT 26.1 (L) 09/10/2021    CRP 317.0 (H) 09/08/2021    A1C 6.3 (H) 07/30/2021  GLUF 92 01/22/2015    GLU 119 09/10/2021    POCGLU 81 09/10/2021    ALBUMIN 1.8 (L) 09/10/2021    PROT 4.8 (L) 09/10/2021     Risk Factors:   - Immobility  - Lack of sensory perception  - Moisture  - Multiple co-morbidities  - unresponsive prior to admission    Braden Scale Score: 12         Support Surface:   - Low Air Loss - ICU    Type Debridement Completed By WOCN:  N/A    Teaching:  - n/a    WOCN Recommendations:   - See nursing orders for wound care instructions.  - Contact WOCN with questions, concerns, or wound deterioration.  - continue pressure injury prevention interventions and nutrition support.   - continue silicone bordered foam dressing    Topical Therapy/Interventions:   - Silicone bordered foam    Recommended Consults:  - Not Applicable    WOCN Follow Up:  - Weekly  - will assess Friday     Plan of Care Discussed With:   - RN primary  - LIP Amy Vota    Supplies Ordered: No    Workup Time:   45 minutes   Reconsult for questions/concerns/deterioration.   PAGE VIA  [facility] Health Directory  Available by Epic Chat   Renaye Rakers RN, BSN, CWOCN

## 2021-09-10 NOTE — Unmapped (Signed)
[facility] Nephrology Continuous Renal Replacement Therapy Procedure Note     09/10/2021    Miguel Ward was seen and examined on CRRT    CHIEF COMPLAINT: Acute Kidney Disease    INTERVAL HISTORY: Remains on pressors, was not extubated due to ongoing decreased mental status.    CURRENT DIALYSIS PRESCRIPTION:  Device: CRRT Device: NxStage  Therapy fluid: Therapy Fluid : NxStage RFP 401 - Contains 4 mEq/L KCL  Therapy fluid rate: Therapy Fluid Rate (L/hr): 1.8 L/hr  Blood flow rate: Blood Pump Rate (mL/min): 250 mL/min  Fluid removal rate: Hourly Fluid Removal Rate (mL/hr): 50 mL/hr    PHYSICAL EXAM:  Vitals:  Temp:  [35.4 ??C (95.7 ??F)-37.2 ??C (99 ??F)] 37.2 ??C (99 ??F)  Core Temp:  [37 ??C (98.6 ??F)] 37 ??C (98.6 ??F)  Heart Rate:  [86-122] 105  SpO2 Pulse:  [83-117] 103  MAP:  [53 mmHg-91 mmHg] 61 mmHg  A BP-2: (82-131)/(36-72) 88/47  MAP:  [53 mmHg-91 mmHg] 61 mmHg    In/Outs:    Intake/Output Summary (Last 24 hours) at 09/10/2021 1025  Last data filed at 09/10/2021 0800  Gross per 24 hour   Intake 2257.56 ml   Output 1198 ml   Net 1059.56 ml        Weights:  Admission Weight: 81.6 kg (180 lb)  Last documented Weight: 71.7 kg (158 lb 1.1 oz)  Weight Change from Previous Day: No weight listed for specified days    Assessment:   General: Appearing ill  Pulmonary: rhonchi  Cardiovascular: regular rate and rhythm  Extremities: trace  edema  Access: Right IJ non-tunneled catheter     LAB DATA:  Lab Results   Component Value Date    NA 134 (L) 09/10/2021    K 4.6 09/10/2021    CL 102 09/10/2021    CO2 22.0 09/10/2021    BUN 46 (H) 09/10/2021    CREATININE 2.36 (H) 09/10/2021      Lab Results   Component Value Date    HCT 28.4 (L) 09/10/2021    HGB 9.4 (L) 09/10/2021    WBC 13.0 (H) 09/10/2021        ASSESSMENT/PLAN:  Acute Kidney Disease on Continuous Renal Replacement Therapy:  - UF goal: 50 mL/hr as tolerated  - Renally dose all medications    Drinda Butts, MD  [facility] Division of Nephrology & Hypertension

## 2021-09-10 NOTE — Unmapped (Signed)
Pt remains drowsy, able to follow commands on being roused. Femoral CVAD site oozed at beginning of shift, additional pressure was held, and new dressing replaced by Yetta Barre, ACNP. 1 UPRBC given, hgb stable for remainder of shift. Stable on current ventilator settings. Unable to wean pressor. CRRT running, ultrafiltrate remains at 14ml/h due to pressor requirement. Minimal urine output. TF advanced throughout shift as tolerated.     No family reached out to nursing during the night for updates.     Problem: Fall Injury Risk  Goal: Absence of Fall and Fall-Related Injury  Outcome: Ongoing - Unchanged  Intervention: Promote Scientist, clinical (histocompatibility and immunogenetics) Documentation  Taken 09/09/2021 2000 by Maryruth Eve, RN  Safety Interventions: aspiration precautions     Problem: Infection  Goal: Absence of Infection Signs and Symptoms  Outcome: Ongoing - Unchanged  Intervention: Prevent or Manage Infection  Recent Flowsheet Documentation  Taken 09/09/2021 2000 by Maryruth Eve, RN  Infection Management: aseptic technique maintained     Problem: Adult Inpatient Plan of Care  Goal: Plan of Care Review  Outcome: Ongoing - Unchanged  Goal: Patient-Specific Goal (Individualized)  Outcome: Ongoing - Unchanged  Goal: Absence of Hospital-Acquired Illness or Injury  Outcome: Ongoing - Unchanged  Intervention: Identify and Manage Fall Risk  Recent Flowsheet Documentation  Taken 09/09/2021 2000 by Maryruth Eve, RN  Safety Interventions: aspiration precautions  Intervention: Prevent Infection  Recent Flowsheet Documentation  Taken 09/09/2021 2000 by Maryruth Eve, RN  Infection Prevention: cohorting utilized  Goal: Optimal Comfort and Wellbeing  Outcome: Ongoing - Unchanged  Goal: Readiness for Transition of Care  Outcome: Ongoing - Unchanged  Goal: Rounds/Family Conference  Outcome: Ongoing - Unchanged     Problem: Skin Injury Risk Increased  Goal: Skin Health and Integrity  Outcome: Ongoing - Unchanged     Problem: Dysrhythmia (Heart Failure)  Goal: Stable Heart Rate and Rhythm  Outcome: Ongoing - Unchanged     Problem: Fluid Imbalance (Heart Failure)  Goal: Fluid Balance  Outcome: Ongoing - Unchanged     Problem: Respiratory Compromise (Heart Failure)  Goal: Effective Oxygenation and Ventilation  Outcome: Ongoing - Unchanged     Problem: Device-Related Complication Risk (Mechanical Ventilation, Invasive)  Goal: Optimal Device Function  Outcome: Ongoing - Unchanged     Problem: Nutrition Impairment (Mechanical Ventilation, Invasive)  Goal: Optimal Nutrition Delivery  Outcome: Ongoing - Unchanged     Problem: Non-Violent Restraints  Goal: Patient will remain free of restraint events  Outcome: Ongoing - Unchanged  Goal: Patient will remain free of physical injury  Outcome: Ongoing - Unchanged     Problem: Impaired Wound Healing  Goal: Optimal Wound Healing  Outcome: Ongoing - Unchanged     Problem: Self-Care Deficit  Goal: Improved Ability to Complete Activities of Daily Living  Outcome: Ongoing - Unchanged     Problem: Diabetes Comorbidity  Goal: Blood Glucose Level Within Targeted Range  Outcome: Ongoing - Unchanged     Problem: Heart Failure Comorbidity  Goal: Maintenance of Heart Failure Symptom Control  Outcome: Ongoing - Unchanged     Problem: Hypertension Comorbidity  Goal: Blood Pressure in Desired Range  Outcome: Ongoing - Unchanged

## 2021-09-11 LAB — BASIC METABOLIC PANEL
ANION GAP: 8 mmol/L (ref 5–14)
ANION GAP: 10 mmol/L (ref 5–14)
ANION GAP: 9 mmol/L (ref 5–14)
BLOOD UREA NITROGEN: 33 mg/dL — ABNORMAL HIGH (ref 9–23)
BLOOD UREA NITROGEN: 34 mg/dL — ABNORMAL HIGH (ref 9–23)
BLOOD UREA NITROGEN: 33 mg/dL — ABNORMAL HIGH (ref 9–23)
BUN / CREAT RATIO: 18
BUN / CREAT RATIO: 19
BUN / CREAT RATIO: 18
CALCIUM: 8.5 mg/dL — ABNORMAL LOW (ref 8.7–10.4)
CALCIUM: 8.4 mg/dL — ABNORMAL LOW (ref 8.7–10.4)
CALCIUM: 8.6 mg/dL — ABNORMAL LOW (ref 8.7–10.4)
CHLORIDE: 103 mmol/L (ref 98–107)
CHLORIDE: 100 mmol/L (ref 98–107)
CHLORIDE: 103 mmol/L (ref 98–107)
CO2: 24 mmol/L (ref 20.0–31.0)
CO2: 23 mmol/L (ref 20.0–31.0)
CO2: 24 mmol/L (ref 20.0–31.0)
CREATININE: 1.79 mg/dL — ABNORMAL HIGH
CREATININE: 1.81 mg/dL — ABNORMAL HIGH
CREATININE: 1.85 mg/dL — ABNORMAL HIGH
EGFR CKD-EPI (2021) MALE: 41 mL/min/{1.73_m2} — ABNORMAL LOW (ref >=60)
EGFR CKD-EPI (2021) MALE: 40 mL/min/{1.73_m2} — ABNORMAL LOW (ref >=60)
EGFR CKD-EPI (2021) MALE: 39 mL/min/{1.73_m2} — ABNORMAL LOW (ref >=60)
GLUCOSE RANDOM: 95 mg/dL (ref 70–179)
GLUCOSE RANDOM: 113 mg/dL (ref 70–179)
GLUCOSE RANDOM: 109 mg/dL (ref 70–179)
POTASSIUM: 4.8 mmol/L (ref 3.4–4.8)
POTASSIUM: 4.7 mmol/L (ref 3.4–4.8)
POTASSIUM: 4.7 mmol/L (ref 3.4–4.8)
SODIUM: 135 mmol/L (ref 135–145)
SODIUM: 133 mmol/L — ABNORMAL LOW (ref 135–145)
SODIUM: 136 mmol/L (ref 135–145)

## 2021-09-11 LAB — TSH: THYROID STIMULATING HORMONE: 0.04 u[IU]/mL — ABNORMAL LOW (ref 0.550–4.780)

## 2021-09-11 LAB — APTT
APTT: 97.6 s — ABNORMAL HIGH (ref 25.1–36.5)
APTT: 82.1 s — ABNORMAL HIGH (ref 25.1–36.5)
APTT: 44 s — ABNORMAL HIGH (ref 25.1–36.5)
HEPARIN CORRELATION: 0.6
HEPARIN CORRELATION: 0.5
HEPARIN CORRELATION: 0.3

## 2021-09-11 LAB — CYTOKINE PANEL
IL1 BETA: 6.8 pg/mL — ABNORMAL HIGH
IL2 RECEPTOR: 5461.6 pg/mL — ABNORMAL HIGH
INTERFERON GAMMA: 32.3 pg/mL — ABNORMAL HIGH
INTERLEUKIN 10: 78.4 pg/mL — ABNORMAL HIGH
INTERLEUKIN 12: 3.8 pg/mL — ABNORMAL HIGH
INTERLEUKIN 13: 6.1 pg/mL — ABNORMAL HIGH
INTERLEUKIN 17: 2.6 pg/mL — ABNORMAL HIGH
INTERLEUKIN 2: 3.7 pg/mL — ABNORMAL HIGH
INTERLEUKIN 4: 2.7 pg/mL — ABNORMAL HIGH
INTERLEUKIN 5: <2.1 pg/mL
INTERLEUKIN 6: 2196.4 pg/mL — ABNORMAL HIGH
INTERLEUKIN 8: 7.5 pg/mL — ABNORMAL HIGH
TNF ALPHA: 7.1 pg/mL

## 2021-09-11 LAB — PROTIME-INR
INR: 1.99
INR: 1.98
INR: 1.75
PROTIME: 23.2 s — ABNORMAL HIGH (ref 9.8–12.8)
PROTIME: 23 s — ABNORMAL HIGH (ref 9.8–12.8)
PROTIME: 20.2 s — ABNORMAL HIGH (ref 9.8–12.8)

## 2021-09-11 LAB — CBC W/ AUTO DIFF
BASOPHILS ABSOLUTE COUNT: 0 10*9/L (ref 0.0–0.1)
BASOPHILS RELATIVE PERCENT: 0.1 %
EOSINOPHILS ABSOLUTE COUNT: 0 10*9/L (ref 0.0–0.5)
EOSINOPHILS RELATIVE PERCENT: 0.2 %
HEMATOCRIT: 26.9 % — ABNORMAL LOW (ref 39.0–48.0)
HEMOGLOBIN: 8.8 g/dL — ABNORMAL LOW (ref 12.9–16.5)
LYMPHOCYTES ABSOLUTE COUNT: 0.2 10*9/L — ABNORMAL LOW (ref 1.1–3.6)
LYMPHOCYTES RELATIVE PERCENT: 1 %
MEAN CORPUSCULAR HEMOGLOBIN CONC: 32.6 g/dL (ref 32.0–36.0)
MEAN CORPUSCULAR HEMOGLOBIN: 27.4 pg (ref 25.9–32.4)
MEAN CORPUSCULAR VOLUME: 84 fL (ref 77.6–95.7)
MEAN PLATELET VOLUME: 11.2 fL — ABNORMAL HIGH (ref 6.8–10.7)
MONOCYTES ABSOLUTE COUNT: 0.3 10*9/L (ref 0.3–0.8)
MONOCYTES RELATIVE PERCENT: 1.4 %
NEUTROPHILS ABSOLUTE COUNT: 19.4 10*9/L — ABNORMAL HIGH (ref 1.8–7.8)
NEUTROPHILS RELATIVE PERCENT: 97.3 %
PLATELET COUNT: 72 10*9/L — ABNORMAL LOW (ref 150–450)
RED BLOOD CELL COUNT: 3.2 10*12/L — ABNORMAL LOW (ref 4.26–5.60)
RED CELL DISTRIBUTION WIDTH: 15.7 % — ABNORMAL HIGH (ref 12.2–15.2)
WBC ADJUSTED: 19.9 10*9/L — ABNORMAL HIGH (ref 3.6–11.2)

## 2021-09-11 LAB — FIBRINOGEN
FIBRINOGEN LEVEL: 458 mg/dL (ref 175–500)
FIBRINOGEN LEVEL: 403 mg/dL (ref 175–500)
FIBRINOGEN LEVEL: 507 mg/dL — ABNORMAL HIGH (ref 175–500)

## 2021-09-11 LAB — HEPATIC FUNCTION PANEL
ALBUMIN: 1.8 g/dL — ABNORMAL LOW (ref 3.4–5.0)
ALKALINE PHOSPHATASE: 305 U/L — ABNORMAL HIGH (ref 46–116)
ALT (SGPT): 40 U/L (ref 10–49)
AST (SGOT): 179 U/L — ABNORMAL HIGH (ref <=34)
BILIRUBIN DIRECT: 2.1 mg/dL — ABNORMAL HIGH (ref 0.00–0.30)
BILIRUBIN TOTAL: 2.4 mg/dL — ABNORMAL HIGH (ref 0.3–1.2)
PROTEIN TOTAL: 5.1 g/dL — ABNORMAL LOW (ref 5.7–8.2)

## 2021-09-11 LAB — CBC
HEMATOCRIT: 25.7 % — ABNORMAL LOW (ref 39.0–48.0)
HEMATOCRIT: 26.1 % — ABNORMAL LOW (ref 39.0–48.0)
HEMOGLOBIN: 8.2 g/dL — ABNORMAL LOW (ref 12.9–16.5)
HEMOGLOBIN: 8.3 g/dL — ABNORMAL LOW (ref 12.9–16.5)
MEAN CORPUSCULAR HEMOGLOBIN CONC: 32 g/dL (ref 32.0–36.0)
MEAN CORPUSCULAR HEMOGLOBIN CONC: 31.7 g/dL — ABNORMAL LOW (ref 32.0–36.0)
MEAN CORPUSCULAR HEMOGLOBIN: 27.2 pg (ref 25.9–32.4)
MEAN CORPUSCULAR HEMOGLOBIN: 26.9 pg (ref 25.9–32.4)
MEAN CORPUSCULAR VOLUME: 85 fL (ref 77.6–95.7)
MEAN CORPUSCULAR VOLUME: 85 fL (ref 77.6–95.7)
MEAN PLATELET VOLUME: 10.9 fL — ABNORMAL HIGH (ref 6.8–10.7)
MEAN PLATELET VOLUME: 10.9 fL — ABNORMAL HIGH (ref 6.8–10.7)
PLATELET COUNT: 68 10*9/L — ABNORMAL LOW (ref 150–450)
PLATELET COUNT: 76 10*9/L — ABNORMAL LOW (ref 150–450)
RED BLOOD CELL COUNT: 3.03 10*12/L — ABNORMAL LOW (ref 4.26–5.60)
RED BLOOD CELL COUNT: 3.07 10*12/L — ABNORMAL LOW (ref 4.26–5.60)
RED CELL DISTRIBUTION WIDTH: 15.9 % — ABNORMAL HIGH (ref 12.2–15.2)
RED CELL DISTRIBUTION WIDTH: 15.9 % — ABNORMAL HIGH (ref 12.2–15.2)
WBC ADJUSTED: 19.4 10*9/L — ABNORMAL HIGH (ref 3.6–11.2)
WBC ADJUSTED: 25.6 10*9/L — ABNORMAL HIGH (ref 3.6–11.2)

## 2021-09-11 LAB — FACTOR II ACTIVITY: FACTOR II ACTIVITY: 39 % — ABNORMAL LOW (ref 77–131)

## 2021-09-11 LAB — T4, FREE: FREE T4: 0.77 ng/dL — ABNORMAL LOW (ref 0.89–1.76)

## 2021-09-11 LAB — PHOSPHORUS
PHOSPHORUS: 2 mg/dL — ABNORMAL LOW (ref 2.4–5.1)
PHOSPHORUS: 2.9 mg/dL (ref 2.4–5.1)
PHOSPHORUS: 3.1 mg/dL (ref 2.4–5.1)

## 2021-09-11 LAB — CK: CREATINE KINASE TOTAL: 488 U/L — ABNORMAL HIGH

## 2021-09-11 LAB — D-DIMER, QUANTITATIVE
D-DIMER QUANTITATIVE (CW,ML,HL,HS,CH,JS,JC): 4681 ng{FEU}/mL — ABNORMAL HIGH (ref <=500)
D-DIMER QUANTITATIVE (CW,ML,HL,HS,CH,JS,JC): 4570 ng{FEU}/mL — ABNORMAL HIGH (ref <=500)
D-DIMER QUANTITATIVE (CW,ML,HL,HS,CH,JS,JC): 4026 ng{FEU}/mL — ABNORMAL HIGH (ref <=500)

## 2021-09-11 LAB — T3, FREE: T3 FREE: <0.2 pg/mL — ABNORMAL LOW (ref 2.30–4.20)

## 2021-09-11 LAB — PLATELET COUNT: PLATELET COUNT: 75 10*9/L — ABNORMAL LOW (ref 150–450)

## 2021-09-11 LAB — FACTOR 7 ACTIVITY: FACTOR VII ACTIVITY: 26 % — ABNORMAL LOW (ref 61–146)

## 2021-09-11 LAB — TACROLIMUS LEVEL, TIMED: TACROLIMUS BLOOD: 5.2 ng/mL

## 2021-09-11 LAB — FACTOR 8 ACTIVITY: FACTOR VIII ACTIVITY: >400 % — ABNORMAL HIGH (ref 56–186)

## 2021-09-11 LAB — MAGNESIUM
MAGNESIUM: 2 mg/dL (ref 1.6–2.6)
MAGNESIUM: 1.9 mg/dL (ref 1.6–2.6)
MAGNESIUM: 2 mg/dL (ref 1.6–2.6)

## 2021-09-11 LAB — LACTATE, VENOUS, WHOLE BLOOD: LACTATE BLOOD VENOUS: 2.3 mmol/L — ABNORMAL HIGH (ref 0.5–1.8)

## 2021-09-11 LAB — HISTOPLASMA ANTIGEN, URINE
HISTOPLASMA AG, URINE RESULT: NOT DETECTED
HISTOPLASMA AG, URINE VALUE: NOT DETECTED ng/mL

## 2021-09-11 LAB — FACTOR V ACTIVITY: FACTOR V ACTIVITY: 100 % (ref 66–148)

## 2021-09-11 MED ADMIN — chlorhexidine (PERIDEX) 0.12 % solution 5 mL: 5 mL | OROMUCOSAL

## 2021-09-11 MED ADMIN — levoFLOXacin (LEVAQUIN) 500 mg/100 mL IVPB 500 mg: 500 mg | INTRAVENOUS | @ 19:00:00 | Stop: 2021-09-15

## 2021-09-11 MED ADMIN — esomeprazole (NexIUM) granules 40 mg: 40 mg | GASTROENTERAL | @ 09:00:00

## 2021-09-11 MED ADMIN — cefepime (MAXIPIME) 1 g in sodium chloride 0.9 % (NS) 100 mL IVPB-connector bag: 1 g | INTRAVENOUS | @ 19:00:00 | Stop: 2021-09-15

## 2021-09-11 MED ADMIN — potassium & sodium phosphates 250mg (PHOS-NAK/NEUTRA PHOS) packet 2 packet: 2 | GASTROENTERAL | @ 11:00:00 | Stop: 2021-09-11

## 2021-09-11 MED ADMIN — aspirin chewable tablet 81 mg: 81.0 mg | GASTROENTERAL | @ 14:00:00

## 2021-09-11 MED ADMIN — levothyroxine (SYNTHROID) tablet 250 mcg: 250 ug | GASTROENTERAL | @ 09:00:00

## 2021-09-11 MED ADMIN — cefepime (MAXIPIME) 1 g in sodium chloride 0.9 % (NS) 100 mL IVPB-connector bag: 1 g | INTRAVENOUS | @ 09:00:00 | Stop: 2021-09-15

## 2021-09-11 MED ADMIN — magnesium oxide (MAG-OX) tablet 400 mg: 400 mg | GASTROENTERAL

## 2021-09-11 MED ADMIN — norepinephrine 8 mg in dextrose 5 % 250 mL (32 mcg/mL) infusion PMB: 0-30 ug/min | INTRAVENOUS | @ 19:00:00

## 2021-09-11 MED ADMIN — cefepime (MAXIPIME) 1 g in sodium chloride 0.9 % (NS) 100 mL IVPB-connector bag: 1 g | INTRAVENOUS | @ 03:00:00 | Stop: 2021-09-15

## 2021-09-11 MED ADMIN — hydrocortisone sod succ (Solu-CORTEF) injection 50 mg: 50 mg | INTRAVENOUS | @ 09:00:00

## 2021-09-11 MED ADMIN — atorvastatin (LIPITOR) tablet 20 mg: 20 mg | GASTROENTERAL

## 2021-09-11 MED ADMIN — hydrocortisone sod succ (Solu-CORTEF) injection 50 mg: 50 mg | INTRAVENOUS | @ 16:00:00

## 2021-09-11 MED ADMIN — chlorhexidine (PERIDEX) 0.12 % solution 5 mL: 5 mL | OROMUCOSAL | @ 12:00:00

## 2021-09-11 MED ADMIN — NxStage/multiBic RFP 401 (+/- BB) 5000 mL - contains 4 mEq/L of potassium dialysis solution 5,000 mL: 5000 mL | INTRAVENOUS_CENTRAL | @ 07:00:00

## 2021-09-11 MED ADMIN — heparin (porcine) 1000 unit/mL injection 2,000 Units: 2 mL | @ 15:00:00

## 2021-09-11 MED ADMIN — magnesium oxide (MAG-OX) tablet 400 mg: 400 mg | GASTROENTERAL | @ 14:00:00

## 2021-09-11 MED ADMIN — white petrolatum-mineral oil (SOOTHE PM) 80-20 % ophthalmic ointment 1 application: 1 | OPHTHALMIC

## 2021-09-11 MED ADMIN — NxStage/multiBic RFP 401 (+/- BB) 5000 mL - contains 4 mEq/L of potassium dialysis solution 5,000 mL: 5000 mL | INTRAVENOUS_CENTRAL | @ 06:00:00

## 2021-09-11 MED ADMIN — phytonadione (vitamin K1) (MEPHYTON) tablet 5 mg: 5 mg | GASTROENTERAL | @ 16:00:00 | Stop: 2021-09-11

## 2021-09-11 MED ADMIN — NxStage/multiBic RFP 401 (+/- BB) 5000 mL - contains 4 mEq/L of potassium dialysis solution 5,000 mL: 5000 mL | INTRAVENOUS_CENTRAL | @ 14:00:00

## 2021-09-11 MED ADMIN — sodium phosphate 21 mmol in dextrose 5 % 250 mL IVPB: 21 mmol | INTRAVENOUS | @ 13:00:00 | Stop: 2021-09-11

## 2021-09-11 MED ADMIN — chlorhexidine (PERIDEX) 0.12 % solution 5 mL: 5 mL | OROMUCOSAL | @ 02:00:00

## 2021-09-11 MED ADMIN — acetaminophen (TYLENOL) tablet 1,000 mg: 1000 mg | GASTROENTERAL | @ 21:00:00 | Stop: 2021-10-07

## 2021-09-11 MED ADMIN — hydrocortisone sod succ (Solu-CORTEF) injection 50 mg: 50 mg | INTRAVENOUS | @ 04:00:00

## 2021-09-11 MED ADMIN — acetaminophen (TYLENOL) tablet 1,000 mg: 1000 mg | GASTROENTERAL | @ 14:00:00 | Stop: 2021-10-07

## 2021-09-11 MED ADMIN — white petrolatum-mineral oil (SOOTHE PM) 80-20 % ophthalmic ointment 1 application: 1 | OPHTHALMIC | @ 14:00:00

## 2021-09-11 MED ADMIN — hydrocortisone sod succ (Solu-CORTEF) injection 50 mg: 50 mg | INTRAVENOUS | @ 21:00:00

## 2021-09-11 MED ADMIN — acetaminophen (TYLENOL) tablet 1,000 mg: 1000 mg | GASTROENTERAL | @ 06:00:00 | Stop: 2021-10-07

## 2021-09-11 NOTE — Unmapped (Signed)
Respiratory Safety Check    Date: 09/10/2021     PPV Device at bedside:   [x]  Yes  []  Replaced  []  N/A    PPV mask at bedside:  [x]  Yes  []  Replaced  []  N/A    Checked humidifier, inspiratory limb, and expiratory limb:   [x]  Yes  []  Replaced  []  N/A    Backup trachs and supplies available at bedside:  []  Yes  []  Replaced  [x]  N/A    Documented trach airway form visible at bedside:   []  Yes  []  Replaced  [x]  N/A    iNO connected with PPV device:  []  Yes  []  Replaced  [x]  N/A

## 2021-09-11 NOTE — Unmapped (Signed)
[facility] Nephrology Continuous Renal Replacement Therapy Procedure Note     09/10/2021    Miguel Ward was seen and examined on CRRT    CHIEF COMPLAINT: Acute Kidney Disease    INTERVAL HISTORY: Remains intubated and on levophed.    CURRENT DIALYSIS PRESCRIPTION:  Device: CRRT Device: NxStage  Therapy fluid: Therapy Fluid : NxStage RFP 401 - Contains 4 mEq/L KCL  Therapy fluid rate: Therapy Fluid Rate (L/hr): 1.8 L/hr  Blood flow rate: Blood Pump Rate (mL/min): 300 mL/min  Fluid removal rate: Hourly Fluid Removal Rate (mL/hr): 50 mL/hr    PHYSICAL EXAM:  Vitals:  Core Temp:  [36.2 ??C (97.2 ??F)-37.6 ??C (99.7 ??F)] 37.5 ??C (99.5 ??F)  Heart Rate:  [91-125] 99  SpO2 Pulse:  [87-121] 105  BP: (81-129)/(34-74) 122/34  MAP (mmHg):  [54-91] 68    In/Outs:    Intake/Output Summary (Last 24 hours) at 09/11/2021 1513  Last data filed at 09/11/2021 1400  Gross per 24 hour   Intake 2230.94 ml   Output 876 ml   Net 1354.94 ml        Weights:  Admission Weight: 81.6 kg (180 lb)  Last documented Weight: 71.7 kg (158 lb 1.1 oz)  Weight Change from Previous Day: No weight listed for specified days    Assessment:   General: Appearing ill  Pulmonary: rhonchi  Cardiovascular: regular rate and rhythm  Extremities: no significant  edema  Access: Right IJ non-tunneled catheter     LAB DATA:  Lab Results   Component Value Date    NA 133 (L) 09/11/2021    K 4.7 09/11/2021    CL 100 09/11/2021    CO2 23.0 09/11/2021    BUN 34 (H) 09/11/2021    CREATININE 1.81 (H) 09/11/2021      Lab Results   Component Value Date    HCT 25.7 (L) 09/11/2021    HGB 8.2 (L) 09/11/2021    WBC 19.4 (H) 09/11/2021        ASSESSMENT/PLAN:  Acute Kidney Disease on Continuous Renal Replacement Therapy:  - UF goal: 50 mL/hr as tolerated  - Renally dose all medications    Drinda Butts, MD  [facility] Division of Nephrology & Hypertension

## 2021-09-11 NOTE — Unmapped (Signed)
Continuous Renal Replacement  Dialysis Nurse Therapy Procedure Note    Treatment Type:  [facility] Number Of Days On Therapy:   Procedure Date:  09/10/2021 7:14 PM     TREATMENT STATUS:  Restarted  Patient and Treatment Status     None          Active Dialysis Orders (168h ago, onward)     Start     Ordered    09/08/21 1428  CRRT Orders - NxStage (Adult)  Continuous        Comments: Fluid removal parameters:   MAP <60: 10 ml/hr;  MAP 61 - 65: 50 ml/hr;   MAP >65: 150 ml/hr   Question Answer Comment   CRRT System: NxStage    Modality: CVVH    Access: Other    BFR (mL/min): 200-350    Dialysate Flow Rate (mL/kg/hr): Other (Specify) 1.8L/hr   Fluid Removal Initial Rate (mL/hr): 10    Fluid Removal Parameters: See below        09/08/21 1429              SYSTEM CHECK:  Machine Name: [id number]  Dialyzer: [id number]   Self Test Completed: Yes.        Alarms Connected To The Wall And Active:  Yes.    VITAL SIGNS:  Temp:  [35.9 ??C (96.6 ??F)-37.1 ??C (98.8 ??F)] 37.1 ??C (98.8 ??F)  Core Temp:  [36.2 ??C (97.2 ??F)-37.2 ??C (99 ??F)] 36.3 ??C (97.3 ??F)  Heart Rate:  [93-122] 106  SpO2 Pulse:  [86-117] 96  Resp:  [17-31] 26  SpO2:  [92 %-100 %] 97 %  BP: (93-114)/(42-51) 93/42  MAP (mmHg):  [58-72] 58  A BP-2: (83-131)/(36-72) 101/54  MAP:  [53 mmHg-91 mmHg] 71 mmHg    ACCESS SITE:        Hemodialysis Catheter With Distal Infusion Port 09/08/21 Right Internal jugular (Active)   Site Assessment Clean;Dry;Intact 09/10/21 1600   Proximal Lumen Status / Patency Blood Return - Brisk 09/10/21 1600   Proximal Lumen Intervention Accessed 09/10/21 1600   Medial Lumen Status / Patency Blood Return - Brisk 09/10/21 1600   Medial Lumen Intervention Accessed 09/10/21 1600   Lumen 3, Distal Status / Patency Blood Return - Brisk;Infusing 09/10/21 1600   Distal Lumen Intervention Flushed 09/10/21 1600   IV Tubing and Needleless Injector Cap Change Due 09/13/21 09/09/21 1600   Dressing Type CHG gel;Occlusive;Transparent 09/10/21 1600   Dressing Status Soiled;Changed 09/10/21 0800   Dressing Intervention Dressing changed 09/10/21 0800   Contraindicated due to: Dressing Intact surrounding insertion site 09/10/21 0800   Dressing Change Due 09/17/21 09/10/21 0800   Line Necessity Reviewed? Y 09/10/21 0800   Line Necessity Indications Yes - Hemodialysis 09/10/21 0800   Line Necessity Reviewed With MDI 09/10/21 0800     Arteriovenous Fistula - Vein Graft  Access 11/01/18 Right;Upper Arm (Active)   Site Assessment Clean;Dry;Intact 09/10/21 1600   AV Fistula Thrill Present;Bruit Present 09/10/21 1600   Status Deaccessed 09/10/21 1600   Dressing Intervention No intervention needed 09/09/21 2000   Dressing Status      No dressing 09/10/21 0800   Site Condition No complications 09/10/21 1600   Dressing Open to air (None) 09/10/21 1600       CATHETER FILL VOLUMES:     Arterial:  mL  Venous:  mL     Lab Results   Component Value Date    NA 133 (L) 09/10/2021    NA 133 (L) 09/10/2021  K 4.0 09/10/2021    K 4.6 09/10/2021    CL 101 09/10/2021    CO2 24.0 09/10/2021    BUN 41 (H) 09/10/2021     Lab Results   Component Value Date    CALCIUM 8.3 (L) 09/10/2021    CAION 4.55 09/10/2021    PHOS 2.3 (L) 09/10/2021    MG 2.0 09/10/2021        SETTINGS:  Blood Pump Rate: 300 mL/min  Replacement Fluid Rate:     Pre-Blood Pump Fluid Rate:    Hourly Fluid Removal Rate: 50 mL/hr   Dialysate Fluid Rate    Therapy Fluid Temperature:       ANTICOAGULANT:  See MAR    ADDITIONAL COMMENTS: cart change out    HEMODIALYSIS ON-CALL NURSE PAGER NUMBER:  ?? Monday thru Saturday 0700 - 1730: Call the Dialysis Unit ext. [phone]   ?? After 1730 and all day Sunday: Call the Dialysis RN Pager Number [phone]     PROCEDURE REVIEW, VERIFICATION, HANDOFF:  CRRT settings verified, procedure reviewed, and instructions given to primary RN.     Primary CRRT RN Verifying: Ocie Bob, RN Dialysis RN Verifying: Barbara Cower Mychal Durio

## 2021-09-11 NOTE — Unmapped (Signed)
Respiratory Safety Check    Date: 09/11/2021     ??? PPV Device at bedside:   [x]  Yes  []  Replaced  []  N/A    ??? PPV mask at bedside:  [x]  Yes  []  Replaced  []  N/A    ??? Checked humidifier, inspiratory limb, and expiratory limb:   [x]  Yes  []  Replaced  []  N/A    ??? Backup trachs and supplies available at bedside:  []  Yes  []  Replaced  [x]  N/A    ??? Documented trach airway form visible at bedside:   []  Yes  []  Replaced  [x]  N/A    ??? iNO connected with PPV device:  []  Yes  []  Replaced  [x]  N/A

## 2021-09-11 NOTE — Unmapped (Signed)
Tacrolimus Therapeutic Monitoring Pharmacy Note    Miguel Ward is a 70 y.o. male continuing tacrolimus.     Indication: Kidney transplant     Date of Transplant: 06/11/2007      Prior Dosing Information: Home regimen 3 mg BID      Goals:  Therapeutic Drug Levels  Tacrolimus trough goal: 2-4 mg/mL -- reduced in the setting of respiratory failure 2/2 legionella pneumonia    Additional Clinical Monitoring/Outcomes  ?? Monitor renal function (SCr and urine output) and liver function (LFTs)  ?? Monitor for signs/symptoms of adverse events (e.g., hyperglycemia, hyperkalemia, hypomagnesemia, hypertension, headache, tremor)    Results:   Tacrolimus level: 5.2 ng/mL, drawn appropriately, downtrending from 12.9 > 9.8 > 7.8 over the last 72 hours    Pharmacokinetic Considerations and Significant Drug Interactions:  ??? Concurrent hepatotoxic medications: None identified  ??? Concurrent CYP3A4 substrates/inhibitors: None identified  ??? Concurrent nephrotoxic medications: Lasix, acyclovir    Assessment/Plan:  Recommendedation(s)  ??? Upon discussion with transplant nephrology, will initiate 1 mg BID starting tonight    Follow-up  ??? Daily levels for now.   ??? A pharmacist will continue to monitor and recommend levels as appropriate    Please page service pharmacist with questions/clarifications.    Kennis Carina, PharmD

## 2021-09-11 NOTE — Unmapped (Signed)
IMMUNOCOMPROMISED HOST INFECTIOUS DISEASE PROGRESS NOTE    Assessment/Plan:     Miguel Ward is a 70 y.o. 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 COVID tests negative x 3 here. Imaging and severity suggested alternative infection vs. superinfection, and thus patient started on broad-spectrum coverage with vancomycin/cefepime with some improvement. Found to have legionellosis (Ag and PCR) and started on levofloxacin as well. Respiratory status has improved, however progress in hemodynamic and mental status have somewhat stagnated bringing up concern for other untreated etiology. We recommend imaging (CT chest, CTH, abdominal US) to further evaluate and continue on broad therapy in meantime.    ID Problem List:  ESRD 2/2 Lupus nephritis s/p deceased donor kidney transplant 05/2007  - Surgical complications: None  - Serologies: CMV D+/R-  - Immunosuppression: TAC 3/2, MMF 250, pred 5  - Prophylaxis prior to admission: acyclovir (hx HSV)  - Rejection history: Early cellular s/p ATG 2008  ??  ??  Pertinent Co-morbidities  SLE with lupus nephritis  CKDIV  RCC R native kidney s/p nephrectomy 02/2014  Suspected RCC transplanted kidney 12/2020, sp percutaneous cryoablation 06/2021  HFmREF (40-45% 06/2019)  CAD  HTN  ??  ??  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  Infection History  Active infections:  #Multifocal pneumonia with Hypoxic respiratory failure and Septic shock, improving  #Legionellosis, 08/24/2021  Tested positive for COVID Ag at home 09/05/21, found minimally responsive and hypoglycemic 09/10/2021, intubated in ED, imaging R>L multifocal opacities. Legionella Ag positive, pt also at risk for other bacterial infection or other pathogens (fungi, PJP, CMV)  - 09/09/2021 found at home minimally responsive, brought to ED, intubated  - 08/25/2021 NP and tracheal specimens negative for SARS-CoV2  - 09/08/21 urine legionella antigen positive. PCR of tracheal aspirate positive  Tx: Vancomycin/cefepime/remdesivir 9/25 -> added levofloxacin 9/26 -> cefepime/levofloxacin 9/27    #COVID antigen positive at home 09/05/21  Three negative PCRs while inpatient. Unclear if implies false positive, perhaps resolving infection from weeks prior  - No remdesivir received  ??  Prior infections:  #Fever and leukocytosis, possible UTI 08/2016  #Liver lesions suspected to be abscesses  - Suspected UTI, improved on empiric broad spectrum therapy Vanc/CFP -> doxy  - CT new liver lesions, FDG avid on PET, concerning for metastases  - Liver biopsy 09/2016 nonspecific inflammatory changes suggesting resolving abscess  - MRI 01/2017 improved liver lesions  ??  #RSV infection 02/2018  - Also diagnosed with decompensated diastolic and systolic HF  ??  Antimicrobial Intolerance/allergy  Penicillins - hives (2014), tolerated cephalosporins       RECOMMENDATIONS    Diagnostic  ?? Please f/u: histo Ag  ?? Follow up Bcx 9/25  ?? Follow up final respiratory cultures 9/25 (prelim OPF)  ?? If further diarrhea, please send C. Diff and GI pathogen panel  ?? Obtain RUQ [place] (with elevating LFTs and INR) and transplant kidney US (to look for fluid collections)  ?? Please obtain CT chest and CTH (persistent poor mental status)    Lab monitoring for antimicrobial  toxicities  ?? CBC w/ diff and??CMP at least weekly  ?? Please repeat EKG after starting quinolone therapy    Treatment  Multifocal pneumonia, Legionella antigen positive  ?? Continue cefepime renally dosed equivalent 2g q8h  ?? Continue Levofloxacin, renally dosed equivalent 750 mg daily  ??  Immunosuppression and prophylaxis  ?? IS deferred to transplant team, prefer to continue tacrolimus if SARS-2 positive  ?? Cont ppx with acyclovir           The [facility] ID service will continue to follow.  Please page the ID Transplant/Liquid Oncology Fellow consult at [phone] with questions.  Patient discussed with Dr. Julaine Hua.    Subjective:     Interval History:    History obtained from:patient and nurse.  Events since last encounter: NAEO, unable to extubate yesterday  Today's HPI for primary problems: Vent settings still low, no fevers. Mental status waxes/wanes so unable to extubate. Pressors down to 2 norepi but turned back up to 8 for hypotension. Had bleeding at R fem site after removal, PTT was also high at the time.    Medications:  Antimicrobials:  Anti-infectives (From admission, onward)    Start     Dose/Rate Route Frequency Ordered Stop    09/09/21 1500  levoFLOXacin (LEVAQUIN) 500 mg/100 mL IVPB 500 mg        Followed by Linked Group Details    500 mg  100 mL/hr over 60 Minutes Intravenous Every 24 hours 09/08/21 1402 09/15/21 1459    09/08/21 2200  cefepime (MAXIPIME) 1 g in sodium chloride 0.9 % (NS) 100 mL IVPB-connector bag         1 g  200 mL/hr over 30 Minutes Intravenous Every 8 hours scheduled 09/08/21 1501 09/15/21 2159          Prior/Current immunomodulators:  Tacrolimus 3/2  MMF 250 bid  Prednisone 5    Other medications reviewed.    Objective:     Vital Signs last 24 hours:  Temp:  [35.8 ??C (96.4 ??F)-37.1 ??C (98.8 ??F)] 37.1 ??C (98.8 ??F)  Core Temp:  [36.2 ??C (97.2 ??F)-37.2 ??C (99 ??F)] 36.3 ??C (97.3 ??F)  Heart Rate:  [93-122] 106  SpO2 Pulse:  [86-117] 96  Resp:  [17-31] 26  BP: (82-96)/(11-51) 93/42  MAP (mmHg):  [34-63] 58  A BP-2: (83-131)/(36-72) 101/54  MAP:  [53 mmHg-91 mmHg] 71 mmHg  FiO2 (%):  [35 %] 35 %  SpO2:  [92 %-100 %] 97 %    Physical Exam:   Patient Lines/Drains/Airways Status     Active Active Lines, Drains, & Airways     Name Placement date Placement time Site Days    ETT  7.5 Sep 16, 2021  1337  -- 3    Hemodialysis Catheter With Distal Infusion Port 09/08/21 Right Internal jugular 09/08/21  1600  Internal jugular  2    NG/OG Tube Decompression 16 Fr. Center mouth September 16, 2021  1500  Center mouth  3    Urethral Catheter Temperature probe 16 Fr. 09/16/2021  1602  Temperature probe  3    Peripheral IV 09/10/21 Anterior;Distal;Left;Upper Arm 09/10/21  1730  Arm  less than 1    Arteriovenous Fistula - Vein Graft  Access 11/01/18 Right;Upper Arm 11/01/18  --  Arm  1044              Const [x]  vital signs above    []  NAD, non-toxic appearance        Eyes []  Lids normal bilaterally, conjunctiva anicteric  and noninjected OU     [] PERRL  [] EOMI  Swollen lids, improving. Pupils equal, reactive to light      ENMT [x]  Normal appearance of external nose and ears, no nasal discharge        [x]  MMM, no lesions on lips or gums []  No thrush, leukoplakia, oral lesions  []  Dentition good []  Edentulous []  Dental caries present  []  Hearing normal  []  TMs with good light reflexes bilaterally         Neck [x]  Neck of normal appearance and trachea midline        []  No thyromegaly, nodules, or tenderness   []  Full neck ROM  RIJ trialysis catheter in place      Lymph [x]  No LAD in neck     []  No LAD in supraclavicular area     []  No LAD in axillae   []  No LAD in epitrochlear chains     []  No LAD in inguinal areas        CV [x]  RRR            [x]  No peripheral edema     []  Pedal pulses intact   []  No abnormal heart sounds appreciated   []  Extremities WWP         Resp [x]  Normal WOB at rest    []  No breathlessness with speaking, no coughing  []  CTA anteriorly    []  CTA bilaterally    Vent sounds anteriorly      GI []  Normal inspection, NTND   []  NABS     []  No umbilical hernia on exam       []  No hepatosplenomegaly     []  Inspection of perineal and perianal areas normal  Transplanted kidney palpable      GU []  Normal external genitalia     [] No urinary catheter present in urethra   []  No CVA tenderness    []  No tenderness over renal allograft  R fem cath removed, dressing in place      MSK []  No clubbing or cyanosis of hands       []  No vertebral point tenderness  []  No focal tenderness or abnormalities on palpation of joints in RUE, LUE, RLE, or LLE        Skin []  No rashes, lesions, or ulcers of visualized skin     []  Skin warm and dry to palpation   RUE with AVG with thrill, dilated up to shoulder. LUE with graft without thrill           Neuro _ Face expression symmetric  []  Sensation to light touch grossly intact throughout    []  Moves extremities equally    []  No tremor noted        []  CNs II-XII grossly intact     []  DTRs normal and symmetric throughout []  Gait unremarkable  Opens eyes to voice, follows commands intermittently      Psych []  Appropriate affect       []  Fluent speech         []  Attentive, good eye contact  []  Oriented to person, place, time          []  Judgment and insight are appropriate   Sedated          Data for Medical Decision Making     Recent Labs   Lab Units 09/10/21  1256 09/10/21  0841 09/10/21  0321 09/09/21  1252 09/09/21  0931 09/08/21  0914 09/08/21  0534   WBC 10*9/L 16.1*  --  13.0*   < >  --    < >  --    HEMOGLOBIN g/dL 8.5*  --  9.4*   < >  --    < >  --    HEMOGLOBIN BG   --   --   --    < >  --   --   --    PLATELET COUNT (1) 10*9/L 80*  --  88*   < >  --    < >  --    NEUTRO ABS 10*9/L  --   --  12.6*  --   --    < >  --    LYMPHO ABS 10*9/L  --   --  0.1*  --   --    < >  --    EOSINO ABS 10*9/L  --   --  0.0  --   --    < >  --    BUN mg/dL 41*  --  46*   < >  --    < > 90*   CREATININE mg/dL 1.61*  --  0.96*   < >  --    < > 5.40*   AST U/L  --   --  113*  --   --    < > 90*   ALT U/L  --   --  22  --   --    < > 21   BILIRUBIN TOTAL mg/dL  --   --  2.2*  --   --    < > 1.9*   ALK PHOS U/L  --   --  156*  --   --    < > 87   POTASSIUM WHOLE BLOOD mmol/L 4.0   < > 4.7*   < > 4.3   < >  --    POTASSIUM mmol/L 4.6  --  4.9*   < >  --    < > 5.2* MAGNESIUM mg/dL 2.0  --  1.6   < >  --    < >  --    PHOSPHORUS mg/dL 2.3*  --  2.8   < >  --    < >  --    CALCIUM mg/dL 8.3*  --  8.3*   < >  --    < > 8.8   CK TOTAL U/L  --   --   --   --  1,205.0*  --  1,775.0*   CRP mg/L  --   --   --   --   --   --  317.0*    < > = values in this interval not displayed.     I reviewed and noted the following labs:WBC 9.4, Hgb 10.2, Plt 70 (b/l), Cr 3.16    Microbiology:    9/25 Bcx pending  9/25 Ucx pending, UA 3 WBC  9/25 Lower resp cx, pending, OPF  9/25 SARS-CoV-2 negative for NP and trach aspirate  9/25 CrAg negative  9/26 RPP negative  9/26 Legionella Ag (urine) positive  9/26 Legionella PCR positive  9/26 CMV PCR negative  9/26 Fungitell negative    Past cultures were reviewed in Epic and CareEverywhere  Review of cultures with microbiology: I reviewed the specimens with the microbiology team and agree with their assessment.    Imaging:    Independent visualization of images: I independently reviewed  the image from (date) and I agree with the findings/interpretation.    Additional Studies:   (09/08/21) EKG [id number]

## 2021-09-11 NOTE — Unmapped (Signed)
MICU Evening Summary     Date of Service: 09/11/2021    Interval History: Miguel Ward is a 70 y.o. male with ESRD 2/2 SLE s/p DDKT 05/2007, HFrEF, CAD who presented on 9/25 with AMS, hypoglycemia, and hypoxemia for which he was intubated and admitted to the MICU. In the MICU he was found to have legionella pneumonia, his COVID positivity resolved, and he developed acute on chronic renal failure requiring CRRT.    Principal Problem:    Acute respiratory distress syndrome (ARDS) due to COVID-19 virus (CMS-HCC)  Active Problems:    Hypercholesterolemia    Hypertension    History of kidney transplant    Renal cell carcinoma (CMS-HCC)    Immunosuppression-related infectious disease (CMS-HCC)    Bilateral lower extremity edema    Elevated troponin I level    GERD (gastroesophageal reflux disease)    Diastolic heart failure (CMS-HCC)    Acute on chronic HFrEF (heart failure with reduced ejection fraction) (CMS-HCC)    COVID  Resolved Problems:    * No resolved hospital problems. *        Assessment & Plan     Neuro:   - Toxic-metabolic encephalopathy - from pneumonia, monitoring   - Analgosedation - nothing ordered currently     Pulmonary:   - Legionella pneumonia - levofloxacin + cefepime. Leukocytosis still rising today, continue to trend. No read back on abdominal and renal ultrasounds overnight.   - Incidental C19 Positivity - negative PCR, no treatment   - Hypoxemic respiratory failure - minimal vent settings overnight, PSV 10/5, 35%. Goal sat >90%.    Cardiovascular:  - CAD - ASA 81mg  daily, atorvastatin 20mg  at bedtime  - Shock, distributive - stress dose steroids, NE.   - HFrEF - monitoring     GI:  - PUD PPX - esomeprazole 40mg  daily   - TF - not at goal, closer to trickle     Renal:  - Renal transplant - immunosuppression as per transplant nephrology. Tac, MMF, prednisone all held currently on MAR.   - AKI on CKD - CRRT, limited in ability to pull overnight due to hypotension. Continue calcitriol 0.25mg  MWF. CRRT heparinized to prevent clotting. Net positive 1.2L today due to hypotension limiting ability to pull.   - Chronic hypomagnesemia - magnesium oxide 400mg  BID     H/O:  - Coagulopathy - patient with oozing from RIJ HD catheter. Suspect on heparinized CRRT circuit due to clotting.     E:   - Hypothyroidism - levothyroxine daily     ID: as above    Critical Care Attestation     This patient was discussed and seen with Dr. Nelson Chimes    The patient was critically ill during the time that I saw the patient. Critical Care Time excluding procedures was 36 minutes.     Leilani Able, MD

## 2021-09-11 NOTE — Unmapped (Signed)
Transplant Nephrology Follow-Up Consult note    Assessment/Recommendations: Miguel Ward is a 70 y.o. male  status post deceased donor kidney transplant on 06/11/2007 (Kidney) for native kidney disease secondary to lupus nephritis who has been admitted with AKI/sepsis.  ??  # Kidney allograft function: (unstable)  - most likely etiology is ATN secondary to sepsis and hypotension  - unable to examine urine sediment due to Covid infection  - Will continue CRRT today for oligo-anuria, acidemia, and fluid overload in context of known ARDS    # Immunosuppression [High risk medical decision making for drug therapy requiring intensive monitoring for toxicity]  - Agree with restart tacrolimus 1 mg BID  - goal trough 2-4  ??  # Covid infection/pneumonia/sepsis  - getting bronch and chest CT today    #AMS  - agree with head CT as you are  ??    Recommendations were discussed with primary service.    Earnest Rosier Vince Ainsley  Division of Nephrology and Hypertension  [facility]  09/11/2021    ___________________________________________________________    Subjective: CRRT off currently due to need for imaging (head and chest CT). Remains intubated, levophed still on but at a lower dose. UOP 25 mL. Was tolerating about 50 ml/hr off with CRRT.    Medications:   Current Facility-Administered Medications   Medication Dose Route Frequency Provider Last Rate Last Admin   ??? acetaminophen (TYLENOL) tablet 1,000 mg  1,000 mg Enteral tube: gastric  Q8H Amy Ross Vota, PA   1,000 mg at 09/11/21 8119   ??? aspirin chewable tablet 81 mg  81 mg Enteral tube: gastric  Daily Amy Ross Vota, PA   81 mg at 09/11/21 1478   ??? atorvastatin (LIPITOR) tablet 20 mg  20 mg Enteral tube: gastric  Nightly Amy Ladon Applebaum, PA   20 mg at 09/10/21 2016   ??? calcitrioL (ROCALTROL) capsule 0.25 mcg  0.25 mcg Enteral tube: gastric  Q MWF Honey Monet Gildardo Cranker, ACNP   0.25 mcg at 09/10/21 2956   ??? cefepime (MAXIPIME) 1 g in sodium chloride 0.9 % (NS) 100 mL IVPB-connector bag  1 g Intravenous [facility] Zannie Cove, MD   Stopped at 09/11/21 [phone]   ??? chlorhexidine (PERIDEX) 0.12 % solution 5 mL  5 mL Mouth BID Amy Ross Vota, PA   5 mL at 09/11/21 0737   ??? dextrose (D10W) 10% bolus 125 mL  12.5 g Intravenous Q10 Min PRN Honey Lynnae January, ACNP   125 mL at 09/08/21 0521   ??? esomeprazole (NexIUM) granules 40 mg  40 mg Enteral tube: gastric  daily Amy Ladon Applebaum, PA   40 mg at 09/11/21 0506   ??? heparin (porcine) 1000 unit/mL injection 2,000 Units  2 mL Intra-cannular Each time in dialysis PRN Elmer Picker, MD   1,400 Units at 09/11/21 1037   ??? heparin (porcine) 1000 unit/mL injection 2,000 Units  2 mL Intra-cannular Each time in dialysis PRN Elmer Picker, MD   1,400 Units at 09/11/21 1036   ??? hydrocortisone sod succ (Solu-CORTEF) injection 50 mg  50 mg Intravenous Q6H SCH Honey Monet Gildardo Cranker, ACNP   50 mg at 09/11/21 1140   ??? influenza vaccine quad (FLUARIX, FLULAVAL, FLUZONE) (6 MOS & UP) 2022-23  0.5 mL Intramuscular During hospitalization Rayetta Pigg, MD       ??? levoFLOXacin (LEVAQUIN) 500 mg/100 mL IVPB 500 mg  500 mg Intravenous Q24H Amy Ladon Applebaum, [place]  Stopped at 09/10/21 1722   ??? levothyroxine (SYNTHROID) tablet 250 mcg  250 mcg Enteral tube: gastric  daily Honey Lynnae January, ACNP   250 mcg at 09/11/21 1610   ??? magnesium oxide (MAG-OX) tablet 400 mg  400 mg Enteral tube: gastric  BID Amy Ross Vota, PA   400 mg at 09/11/21 9604   ??? norepinephrine 8 mg in dextrose 5 % 250 mL (32 mcg/mL) infusion PMB  0-30 mcg/min Intravenous Continuous Rosie Fate, DO 26.3 mL/hr at 09/11/21 1315 14 mcg/min at 09/11/21 1315   ??? NxStage RFP 400 (+/- BB) 5000 mL - contains 2 mEq/L of potassium dialysis solution 5,000 mL  5,000 mL CRRT Continuous Elmer Picker, MD       ??? NxStage/multiBic RFP 401 (+/- BB) 5000 mL - contains 4 mEq/L of potassium dialysis solution 5,000 mL  5,000 mL CRRT Continuous Elmer Picker, MD   5,000 mL at 09/11/21 0943 ??? ondansetron (ZOFRAN) injection 4 mg  4 mg Intravenous Q6H PRN Amy Ladon Applebaum, PA       ??? sodium phosphate 21 mmol in dextrose 5 % 250 mL IVPB  21 mmol Intravenous Once Zannie Cove, MD 47 mL/hr at 09/11/21 0911 21 mmol at 09/11/21 0911   ??? tacrolimus (PROGRAF) capsule 1 mg  1 mg Oral BID Zannie Cove, MD       ??? white petrolatum-mineral oil (SOOTHE PM) 80-20 % ophthalmic ointment 1 application  1 application Both Eyes BID Honey Monet Gildardo Cranker, ACNP   1 application at 09/11/21 0942      Physical Exam:   Vitals:    09/11/21 1500   BP: 122/34   Pulse: 99   Resp: 29   Temp:    SpO2: 93%     I/O this shift:  In: 1128 [I.V.:91.6; NG/GT:810; IV Piggyback:226.4]  Out: 240 [Other:240]    Intake/Output Summary (Last 24 hours) at 09/11/2021 1507  Last data filed at 09/11/2021 1400  Gross per 24 hour   Intake 2230.94 ml   Output 876 ml   Net 1354.94 ml     General: Intubated, sedated  HEENT: ETT in place  CV: regular rate and rhythm  Lungs: Mechanically ventilated  Neuro: sedated    Test Results  Reviewed  Lab Results   Component Value Date    NA 133 (L) 09/11/2021    K 4.7 09/11/2021    CL 100 09/11/2021    CO2 23.0 09/11/2021    BUN 34 (H) 09/11/2021    CREATININE 1.81 (H) 09/11/2021    GFR 44.94 (L) 02/28/2013    GLU 113 09/11/2021    CALCIUM 8.4 (L) 09/11/2021    ALBUMIN 1.8 (L) 09/11/2021    PHOS 2.9 09/11/2021

## 2021-09-11 NOTE — Unmapped (Addendum)
VENOUS ACCESS ULTRASOUND PROCEDURE NOTE    Indications:   Poor venous access.    The Venous Access Team has assessed this patient for the placement of a PIV. Ultrasound guidance was necessary to obtain access.     Procedure Details:  Identity of the patient was confirmed via name, medical record number and date of birth. The availability of the correct equipment was verified.    The vein was identified for ultrasound catheter insertion.  Field was prepared with necessary supplies and equipment.  Probe cover and sterile gel utilized.  Insertion site was prepped with chlorhexidine solution and allowed to dry.  The catheter extension was primed with normal saline.A(n) 20g x 2.25 inch catheter was placed in the left AC with 1 attempt(s). See LDA for additional details.    Catheter aspirated, 3 mL blood return present. The catheter was then flushed with 10 mL of normal saline. Insertion site cleansed, and dressing applied per manufacturer guidelines. The catheter was inserted with difficulty due to poor vasculature  by Ashley Akin RN.     Rodman Pickle, RN was notified.     Thank you,     Ashley Akin RN Venous Access Team   [phone]     Workup / Procedure Time:  30 minutes    See vein image below:

## 2021-09-11 NOTE — Unmapped (Signed)
Pt remains drowsy, opens eyes to voice and intermittently follows commands. Remains on PSV 10/5 35% FIO2. Levo titrated to maintain MAP > 65. CT scan today. Wife updated at bedside.     Problem: Fall Injury Risk  Goal: Absence of Fall and Fall-Related Injury  Outcome: Progressing     Problem: Infection  Goal: Absence of Infection Signs and Symptoms  Outcome: Progressing     Problem: Adult Inpatient Plan of Care  Goal: Plan of Care Review  Outcome: Progressing  Goal: Patient-Specific Goal (Individualized)  Outcome: Progressing  Goal: Absence of Hospital-Acquired Illness or Injury  Outcome: Progressing  Goal: Optimal Comfort and Wellbeing  Outcome: Progressing  Goal: Readiness for Transition of Care  Outcome: Progressing  Goal: Rounds/Family Conference  Outcome: Progressing     Problem: Skin Injury Risk Increased  Goal: Skin Health and Integrity  Outcome: Progressing  Intervention: Optimize Skin Protection  Recent Flowsheet Documentation  Taken 09/11/2021 1600 by Glendon Axe, RN BSN  Pressure Reduction Techniques:   frequent weight shift encouraged   heels elevated off bed   weight shift assistance provided  Pressure Reduction Devices: pressure-redistributing mattress utilized  Taken 09/11/2021 1400 by Glendon Axe, RN BSN  Pressure Reduction Techniques:   heels elevated off bed   frequent weight shift encouraged   weight shift assistance provided  Pressure Reduction Devices: pressure-redistributing mattress utilized  Taken 09/11/2021 1200 by Glendon Axe, RN BSN  Pressure Reduction Techniques:   frequent weight shift encouraged   heels elevated off bed   weight shift assistance provided  Pressure Reduction Devices: pressure-redistributing mattress utilized  Taken 09/11/2021 1000 by Glendon Axe, RN BSN  Pressure Reduction Techniques:   heels elevated off bed   frequent weight shift encouraged   weight shift assistance provided  Pressure Reduction Devices: pressure-redistributing mattress utilized  Taken 09/11/2021 0800 by Glendon Axe, RN BSN  Pressure Reduction Techniques:   frequent weight shift encouraged   weight shift assistance provided  Pressure Reduction Devices: pressure-redistributing mattress utilized     Problem: Dysrhythmia (Heart Failure)  Goal: Stable Heart Rate and Rhythm  Outcome: Progressing     Problem: Fluid Imbalance (Heart Failure)  Goal: Fluid Balance  Outcome: Progressing     Problem: Respiratory Compromise (Heart Failure)  Goal: Effective Oxygenation and Ventilation  Outcome: Progressing     Problem: Device-Related Complication Risk (Mechanical Ventilation, Invasive)  Goal: Optimal Device Function  Outcome: Progressing     Problem: Nutrition Impairment (Mechanical Ventilation, Invasive)  Goal: Optimal Nutrition Delivery  Outcome: Progressing     Problem: Non-Violent Restraints  Goal: Patient will remain free of restraint events  Outcome: Progressing  Goal: Patient will remain free of physical injury  Outcome: Progressing     Problem: Self-Care Deficit  Goal: Improved Ability to Complete Activities of Daily Living  Outcome: Progressing     Problem: Impaired Wound Healing  Goal: Optimal Wound Healing  Outcome: Progressing     Problem: Diabetes Comorbidity  Goal: Blood Glucose Level Within Targeted Range  Outcome: Progressing     Problem: Heart Failure Comorbidity  Goal: Maintenance of Heart Failure Symptom Control  Outcome: Progressing     Problem: Hypertension Comorbidity  Goal: Blood Pressure in Desired Range  Outcome: Progressing

## 2021-09-11 NOTE — Unmapped (Signed)
IMMUNOCOMPROMISED HOST INFECTIOUS DISEASE PROGRESS NOTE    Assessment/Plan:     Miguel Ward is a 70 y.o. 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 COVID tests negative x 3 here. Imaging and severity suggested alternative infection vs. superinfection, and thus patient started on broad-spectrum coverage with vancomycin/cefepime -> cefepime with some improvement. Found to have legionellosis (Ag and PCR) and started on levofloxacin as well. Respiratory status has improved, however progress in hemodynamic and mental status have somewhat stagnated bringing up concern for other untreated etiology. CT chest with significant consolidations throughout R lung, no abscesses/cavitations seen, and no updates on prior culture data - would recommend bronchoscopy for further evaluation while continuing on current antimicrobial therapy.    ID Problem List:  ESRD 2/2 Lupus nephritis s/p deceased donor kidney transplant 05/2007  - Surgical complications: None  - Serologies: CMV D+/R-  - Immunosuppression: TAC 3/2, MMF 250, pred 5  - Prophylaxis prior to admission: acyclovir (hx HSV)  - Rejection history: Early cellular s/p ATG 2008        Pertinent Co-morbidities  SLE with lupus nephritis  CKDIV  RCC R native kidney s/p nephrectomy 02/2014  Suspected RCC transplanted kidney 12/2020, sp percutaneous cryoablation 06/2021  HFmREF (40-45% 06/2019)  CAD  HTN        Pertinent Exposure History  Occasionally mows lawn and works on car in garage, no insect/tick exposures  No animal exposures  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 Legionella Ag positive, pt also at risk for other bacterial infection or other pathogens (fungi, PJP, CMV)  - 08/17/2021 found at home minimally responsive, brought to ED, intubated  - 09/05/2021 NP and tracheal specimens negative for SARS-CoV2  - 09/08/21 urine legionella antigen positive. PCR of tracheal aspirate positive  - 09/11/21 CT chest extensive mass-like consolidations with air bronchograms throughout R lung, some in LUL as well  - 09/11/21 Increasing WBC, LFTs (particularly ALP and bilirubin)  Tx: Vancomycin/cefepime/remdesivir 9/25 -> added levofloxacin 9/26 -> cefepime/levofloxacin 9/27    #COVID antigen positive at home 09/05/21  Three negative PCRs while inpatient. Unclear if implies false positive, perhaps resolving infection from weeks prior  - No remdesivir received     Prior infections:  #Fever and leukocytosis, possible UTI 08/2016  #Liver lesions suspected to be abscesses  - Suspected UTI, improved on empiric broad spectrum therapy Vanc/CFP -> doxy  - CT new liver lesions, FDG avid on PET, concerning for metastases  - Liver biopsy 09/2016 nonspecific inflammatory changes suggesting resolving abscess  - MRI 01/2017 improved liver lesions     #RSV infection 02/2018  - Also diagnosed with decompensated diastolic and systolic HF     Antimicrobial Intolerance/allergy  Penicillins - hives (2014), tolerated cephalosporins       RECOMMENDATIONS    Diagnostic  Please f/u: histo Ag  Repeat Bcx x 2  Follow up Bcx 9/25  Follow up final respiratory cultures  9/25 (prelim OPF)  If able, please obtain bronchoscopy and send for following:   - Aerobic/anaerobic cultures   - Fungal cultures   - AFB cultures    - CMV PCR   - EBV PCR   - Cytology   - PJP DFA   - Galactomannan (aspergillus Ag)   - Actinomyces screen   - Mycoplasma PCR  If ALP and Tbili continue to increase, may consider GI evaluation, vs. Further imaging ([place] with doppler, MRCP, etc.)    Lab monitoring for antimicrobial toxicities  CBC w/ diff and CMP at least weekly  Please repeat EKG after starting quinolone therapy    Treatment  Multifocal pneumonia, Legionella antigen positive  Continue cefepime renally dosed equivalent 2g q8h  Continue Levofloxacin, CRRT dosed equivalent 500 mg daily  Duration minimum 10 days, up to 14 days (through at least 09/17/21)  If patient were to decompensate, could consider adding back on vancomycin vs. linezolid for MRSA coverage, and possibly micafungin.     Immunosuppression and prophylaxis  IS deferred to transplant team, prefer to continue tacrolimus if SARS-2 positive  Cont ppx with acyclovir           The [facility] ID service will continue to follow.  Please page the ID Transplant/Liquid Oncology Fellow consult at [phone] with questions.  Patient discussed with Dr. Julaine Hua.    Subjective:     Interval History:    History obtained from:patient and nurse.  Events since last encounter: NAEO, pressors briefly decreased but increased and now up higher  Today's HPI for primary problems: Vent settings still minimal. Pressors off while CRRT held for images, restarted and now increased higher than prior. No fevers. Mental status waxes/wanes, worse during evaluation.    Medications:  Antimicrobials:  Anti-infectives (From admission, onward)      Start     Dose/Rate Route Frequency Ordered Stop    09/09/21 1500  levoFLOXacin (LEVAQUIN) 500 mg/100 mL IVPB 500 mg        Followed by Linked Group Details    500 mg  100 mL/hr over 60 Minutes Intravenous Every 24 hours 09/08/21 1402 09/15/21 1459    09/08/21 2200  cefepime (MAXIPIME) 1 g in sodium chloride 0.9 % (NS) 100 mL IVPB-connector bag         1 g  200 mL/hr over 30 Minutes Intravenous Every 8 hours scheduled 09/08/21 1501 09/15/21 2159            Prior/Current immunomodulators:  Tacrolimus 3/2  MMF 250 bid  Prednisone 5    Other medications reviewed.    Objective:     Vital Signs last 24 hours:  Core Temp:  [36.2 ??C (97.2 ??F)-37.2 ??C (99 ??F)] 36.6 ??C (97.9 ??F)  Heart Rate:  [91-121] 107  SpO2 Pulse:  [87-121] 114  Resp:  [24-31] 27  BP: (81-129)/(42-74) 116/53  MAP (mmHg):  [55-91] 71  A BP-2: (83-104)/(47-62) 101/54  MAP:  [61 mmHg-75 mmHg] 71 mmHg  FiO2 (%):  [35 %] 35 %  SpO2:  [95 %-100 %] 100 %    Physical Exam:   Patient Lines/Drains/Airways Status       Active Active Lines, Drains, & Airways       Name Placement date Placement time Site Days    ETT  7.5 08/30/2021  1337  -- 3    Hemodialysis Catheter With Distal Infusion Port 09/08/21 Right Internal jugular 09/08/21  1600  Internal jugular  2    NG/OG Tube Decompression 16  Fr. Center mouth 09-27-21  1500  Center mouth  3    Urethral Catheter Temperature probe 16 Fr. September 27, 2021  1602  Temperature probe  3    Peripheral IV 09/10/21 Anterior;Distal;Left;Upper Arm 09/10/21  1730  Arm  less than 1    Arteriovenous Fistula - Vein Graft  Access 11/01/18 Right;Upper Arm 11/01/18  --  Arm  1045                  Const [x]  vital signs above    []  NAD, non-toxic appearance        Eyes []  Lids normal bilaterally, conjunctiva anicteric and noninjected OU     [] PERRL  [] EOMI  Swollen lids and conjunctiva. Pupils equal, reactive to light      ENMT [x]  Normal appearance of external nose and ears, no nasal discharge        [x]  MMM, no lesions on lips or gums []  No thrush, leukoplakia, oral lesions  []  Dentition good []  Edentulous []  Dental caries present  []  Hearing normal  []  TMs with good light reflexes bilaterally         Neck [x]  Neck of normal appearance and trachea midline        []  No thyromegaly, nodules, or tenderness   []  Full neck ROM  RIJ trialysis catheter in place      Lymph [x]  No LAD in neck     []  No LAD in supraclavicular area     []  No LAD in axillae   []  No LAD in epitrochlear chains     []  No LAD in inguinal areas CV [x]  RRR            [x]  No peripheral edema     []  Pedal pulses intact   []  No abnormal heart sounds appreciated   []  Extremities WWP         Resp [x]  Normal WOB at rest    []  No breathlessness with speaking, no coughing  []  CTA anteriorly    []  CTA bilaterally    Vent sounds anteriorly      GI []  Normal inspection, NTND   []  NABS     []  No umbilical hernia on exam       []  No hepatosplenomegaly     []  Inspection of perineal and perianal areas normal  Transplanted kidney palpable      GU []  Normal external genitalia     [] No urinary catheter present in urethra   []  No CVA tenderness    []  No tenderness over renal allograft  R fem cath removed, dressing in place      MSK []  No clubbing or cyanosis of hands       []  No vertebral point tenderness  []  No focal tenderness or abnormalities on palpation of joints in RUE, LUE, RLE, or LLE        Skin []  No rashes, lesions, or ulcers of visualized skin     []  Skin warm and dry to palpation   RUE with AVG with thrill, dilated up to shoulder. LUE with graft without thrill           Neuro _ Face expression symmetric  []  Sensation to light touch grossly intact throughout    []  Moves extremities equally    []  No tremor noted        []  CNs II-XII grossly intact     []  DTRs normal and symmetric throughout []  Gait unremarkable  Opens eyes  to voice briefly, not following commands      Psych []  Appropriate affect       []  Fluent speech         []  Attentive, good eye contact  []  Oriented to person, place, time          []  Judgment and insight are appropriate   Sedated          Data for Medical Decision Making     Recent Labs   Lab Units 09/11/21  0430 09/09/21  1252 09/09/21  0931 09/08/21  0914 09/08/21  0534   WBC 10*9/L 19.9*   < >  --    < >  --    HEMOGLOBIN g/dL 8.8*   < >  --    < >  --    HEMOGLOBIN BG   --    < >  --   --   --    PLATELET COUNT (1) 10*9/L 72*   < >  --    < >  --    NEUTRO ABS 10*9/L 19.4*   < >  --    < >  --    LYMPHO ABS 10*9/L 0.2*   < >  --    < > --    EOSINO ABS 10*9/L 0.0   < >  --    < >  --    BUN mg/dL 33*   < >  --    < > 90*   CREATININE mg/dL 1.61*   < >  --    < > 5.40*   AST U/L 179*   < >  --    < > 90*   ALT U/L 40   < >  --    < > 21   BILIRUBIN TOTAL mg/dL 2.4*   < >  --    < > 1.9*   ALK PHOS U/L 305*   < >  --    < > 87   POTASSIUM WHOLE BLOOD   --    < > 4.3   < >  --    POTASSIUM mmol/L 4.8   < >  --    < > 5.2*   MAGNESIUM mg/dL 2.0   < >  --    < >  --    PHOSPHORUS mg/dL 2.0*   < >  --    < >  --    CALCIUM mg/dL 8.5*   < >  --    < > 8.8   CK TOTAL U/L  --   --  1,205.0*  --  1,775.0*   CRP mg/L  --   --   --   --  317.0*    < > = values in this interval not displayed.     I reviewed and noted the following labs: WBC 19.9, Hgb8.8, Plt 72 (mostly stable), ALP 305 (doubled from yesterday)    Microbiology:    9/25 Bcx pending  9/25 Ucx pending, UA 3 WBC  9/25 Lower resp cx, pending, OPF  9/25 SARS-CoV-2 negative for NP and trach aspirate  9/25 CrAg negative  9/26 RPP negative  9/26 Legionella Ag (urine) positive  9/26 Legionella PCR positive  9/26 CMV PCR negative  9/26 Fungitell negative    Past cultures were reviewed in Epic and CareEverywhere  Review of cultures with microbiology: I reviewed the specimens with the microbiology team and agree with their assessment.    Imaging:  Independent visualization of images: I independently reviewed the image from (date) and I agree with the findings/interpretation.    Additional Studies:   (09/08/21) EKG [id number]

## 2021-09-11 NOTE — Unmapped (Signed)
MICU Daily Progress Note     Date of Service: 09/11/2021    Problem List:   Principal Problem:    Acute respiratory distress syndrome (ARDS) due to COVID-19 virus (CMS-HCC)  Active Problems:    Hypercholesterolemia    Hypertension    History of kidney transplant    Renal cell carcinoma (CMS-HCC)    Immunosuppression-related infectious disease (CMS-HCC)    Bilateral lower extremity edema    Elevated troponin I level    GERD (gastroesophageal reflux disease)    Diastolic heart failure (CMS-HCC)    Acute on chronic HFrEF (heart failure with reduced ejection fraction) (CMS-HCC)    COVID    ATN (acute tubular necrosis) (CMS-HCC)    Acute renal failure with tubular necrosis (CMS-HCC)  Resolved Problems:    * No resolved hospital problems. *      Interval history: Miguel Ward is a 70 y.o. male renal transplant from lupus nephritis (complex course over last 15 years since transplant followed here including renal cell) here with acute hypoxic respiratory failure, COVID 19 and shock with AKI.    Interval events:  - UF 50 for the last 24 hours net - 1500   - remains on PSV     Neurological   #acute metabolic encephalopathy  - neuro checks: q4h, arouses to voice, neuro improving  - analegsia prn  - arouses to voice, and intermittently FC     Pulmonary   #ARDS, Acute resp failure   - tolerating PSV and passed SBT again this morning, however holding off on extubation until mental status improves with better cough.      Cardiovascular   #septic shock, elevated trop  #HFrEF, ho htn, hld, cad  - arrived from ED on levo 26--> weaned to 2 on 9/27 however since then has had a higher pressure requirement  - MAP goals >65  - formal echo 9/26 EF @ 30%, 2020 ECHO 40-45%  - cont home regimen asa statin  - hold home regimen metop, vasotec, lasix  - SDS 9/26-->     Renal   #AKI (baseline Cr 2.7-3), AGMA likely ATN 2/2 sepsis and hypotension  #hx renal transplant s/t lupus nephritis, c/b rejection and renal cell CA s/p cryo ablation  - foley for accurate I/O during critical illness   - worsening renal fxn and CRRT started 9/26. UF increased to 100 goal net even  - resume tac when level improves with goal of 2-4.  Hold cellcept     Infectious Disease/Autoimmune   #COVID 19 infection  Symptom onset:??9/22  Exposure: unknown  COVID PCR+: neg x 3  (home test positive 9/23?)  Day of admission/transfer: 9/25  COVID Specific??Meds:??none given   Vaccination status: full & boosted x once, evushield given 8/17  3 PCRs since admission negative and taken off precautions   ??  9/25 Bcx neg   9/25 Ucx pending, UA 3 WBC  9/25 Lower resp cx, pending, OPF  9/25 SARS-CoV-2 negative for NP and trach aspirate  9/25 CrAg negative  9/26 RPP negative  9/26 Legionella Ag (urine) positive  9/26 Legionella PCR positive  9/26 CMV PCR negative  ??  Multifocal PNA:   - started on vanco/cefepime on 9/25  - MRSA screen neg and vanco stopped 9/27  - cont cefepime 9/25-->     Legionella, PNA   - levofloxacin started 9/26  ??  Cultures:  Blood Culture, Routine (no units)   Date Value   09/03/2021 No Growth at 72 hours  2021/09/30 No Growth at 72 hours     Urine Culture, Comprehensive (no units)   Date Value   September 30, 2021 NO GROWTH     Lower Respiratory Culture (no units)   Date Value   09-30-21 OROPHARYNGEAL FLORA ISOLATED     WBC (10*9/L)   Date Value   09/11/2021 19.4 (H)     WBC, UA (/HPF)   Date Value   09/30/2021 <1        FEN/GI   #gerd  -  TF, adv to goal as tol  - ppi for gi ppx  - bowel regimen w miralax held for loose stools    Malnutrition Assessment: Not done yet.  Body mass index is 22.68 kg/m??.        Heme/Coag     - was on heparin gtt for NSTEMI and afib- stopped 9/27  - cont ppx with heparin in CRRT circuit     Endocrine   #hypoglycemic, hypothryoid  - glucose within target range yes  - cont ADI + accu checks   - hold home regimen allopurinol  - cont home regimen synthroid    Integumentary   #  - WOCN consulted for high risk skin assessment No. Reason: no alterations in skin.  - WOCN recs >> pending , agree with assmt and plan   - cont pressure mitigating precautions per skin policy    Prophylaxis/LDA/Restraints/Consults   Can CVC be removed? No: dialysis catheter   Can A-line be removed? No: >moderate dose pressors  Can Foley be removed? No: Need continuous I/O  Mobility plan: Step 1 - Range of motion    Feeding: Trickle feeds, advance as tolerated  Analgesia: Pain adequately controlled  Sedation SAT/SBT: Yes  Thromboembolic ppx: held SQ heparin; has heparin through CRRT circuit   Head of bed >30 degrees: Yes  Ulcer ppx: Yes, home use continued  Glucose within target range: Yes, in range    Does patient need/have an active type/screen? Yes    RASS at goal? No - adjusting sedation or order to reflect need  [place] Agitation Assessment Scale (RASS) : -3 (09/11/2021 12:00 PM)     Can antipsychotics be stopped? N/A, not on antipsychotics  CAM-ICU Result: Positive (09/10/2021  8:00 PM)      Would hospice care be appropriate for this patient? No, patient improving or expected to improve  Any unaddressed hospice/palliative care needs? no    Patient Lines/Drains/Airways Status     Active Active Lines, Drains, & Airways     Name Placement date Placement time Site Days    ETT  7.5 2021-09-30  1337  -- 4    Hemodialysis Catheter With Distal Infusion Port 09/08/21 Right Internal jugular 09/08/21  1600  Internal jugular  2    NG/OG Tube Decompression 16 Fr. Center mouth 09/30/2021  1500  Center mouth  3    Urethral Catheter Temperature probe 16 Fr. 2021/09/30  1602  Temperature probe  3    Peripheral IV 09/10/21 Anterior;Distal;Left;Upper Arm 09/10/21  1730  Arm  less than 1    Arteriovenous Fistula - Vein Graft  Access 11/01/18 Right;Upper Arm 11/01/18  --  Arm  1045              Patient Lines/Drains/Airways Status     Active Wounds     Name Placement date Placement time Site Days    Wound 09/09/21 Other (comment) Sacrum Right dark red nonblanching with dark skin surrounding 09/09/21  2130 Sacrum  1  Goals of Care     Code Status: Full Code    Public relations account executive Maker:  Miguel Ward current decisional capacity for healthcare decision-making is Full capacity. His designated healthcare decision maker(s) is/are   HCDM (patient stated preference): Heindl,Katherine - Spouse - [phone].      Subjective   Intubated and non verbal    Objective     Vitals - past 24 hours  Heart Rate:  [91-125] 125  SpO2 Pulse:  [87-121] 99  Resp:  [24-34] 24  BP: (81-129)/(35-74) 93/35  FiO2 (%):  [35 %] 35 %  SpO2:  [91 %-100 %] 95 % Intake/Output  I/O last 3 completed shifts:  In: 3692.9 [I.V.:782.4; Blood:475; NG/GT:1710; IV Piggyback:725.4]  Out: 1161 [Urine:40; Other:1121]     Physical Exam:    General: thin elderly male, intubated, in NAD   HEENT: PERRL  CV: RRR, no m/r/g, +2 pitting BLE edema   Pulm: diminished on right, no wheezing, course crackles through out  GI: abd round, non tender  MSK: grossly intact, tone intact  Skin: grossly intact, warm  Neuro: mae spontaneously, opens eyes to voice. Not FC       Continuous Infusions:   ??? heparin (porcine) FOR CRRT 5 Units/kg/hr (09/10/21 1900)   ??? norepinephrine bitartrate-NS 14 mcg/min (09/11/21 1315)   ??? NxStage RFP 400 (+/- BB) 5000 mL - contains 2 mEq/L of potassium     ??? NxStage/multiBic RFP 401 (+/- BB) 5000 mL - contains 4 mEq/L of potassium         Scheduled Medications:   ??? acetaminophen  1,000 mg Enteral tube: gastric  Q8H   ??? aspirin  81 mg Enteral tube: gastric  Daily   ??? atorvastatin  20 mg Enteral tube: gastric  Nightly   ??? calcitrioL  0.25 mcg Enteral tube: gastric  Q MWF   ??? Cefepime  1 g Intravenous [facility]   ??? chlorhexidine  5 mL Mouth BID   ??? esomeprazole  40 mg Enteral tube: gastric  daily   ??? hydrocortisone sod succ  50 mg Intravenous Q6H SCH   ??? flu vacc qs2022-23 6mos up(PF)  0.5 mL Intramuscular During hospitalization   ??? levoFLOXacin  500 mg Intravenous Q24H   ??? levothyroxine  250 mcg Enteral tube: gastric  daily   ??? magnesium oxide  400 mg Enteral tube: gastric  BID   ??? sodium phosphate  21 mmol Intravenous Once   ??? tacrolimus  1 mg Oral BID   ??? white petrolatum-mineral oil  1 application Both Eyes BID       PRN medications:  dextrose in water, heparin (porcine), heparin (porcine), ondansetron    Data/Imaging Review: Reviewed in Epic and personally interpreted on 09/11/2021. See EMR for detailed results.       Critical Care Attestation     This patient is critically ill or injured with the impairment of vital organ systems such that there is a high probability of imminent or life threatening deterioration in the patient's condition. This patient must remain in the ICU for ongoing evaluation of the comprehensive management plan outlined in this note. I directly provided critical care services as documented in this note and the critical care time spent (50  min) is exclusive of separately billable procedures.  In addition to time spent for critical care management, I also provided advance care planning services for 0  minutes (see ACP note for details)???. Total billable critical care time 50  minutes.  Toma Aran, PA

## 2021-09-12 LAB — BASIC METABOLIC PANEL
ANION GAP: 10 mmol/L (ref 5–14)
ANION GAP: 5 mmol/L (ref 5–14)
ANION GAP: 6 mmol/L (ref 5–14)
BLOOD UREA NITROGEN: 32 mg/dL — ABNORMAL HIGH (ref 9–23)
BLOOD UREA NITROGEN: 32 mg/dL — ABNORMAL HIGH (ref 9–23)
BLOOD UREA NITROGEN: 31 mg/dL — ABNORMAL HIGH (ref 9–23)
BUN / CREAT RATIO: 19
BUN / CREAT RATIO: 20
BUN / CREAT RATIO: 20
CALCIUM: 8.6 mg/dL — ABNORMAL LOW (ref 8.7–10.4)
CALCIUM: 8.8 mg/dL (ref 8.7–10.4)
CALCIUM: 8.4 mg/dL — ABNORMAL LOW (ref 8.7–10.4)
CHLORIDE: 102 mmol/L (ref 98–107)
CHLORIDE: 102 mmol/L (ref 98–107)
CHLORIDE: 103 mmol/L (ref 98–107)
CO2: 23 mmol/L (ref 20.0–31.0)
CO2: 26 mmol/L (ref 20.0–31.0)
CO2: 26 mmol/L (ref 20.0–31.0)
CREATININE: 1.72 mg/dL — ABNORMAL HIGH
CREATININE: 1.63 mg/dL — ABNORMAL HIGH
CREATININE: 1.56 mg/dL — ABNORMAL HIGH
EGFR CKD-EPI (2021) MALE: 43 mL/min/{1.73_m2} — ABNORMAL LOW (ref >=60)
EGFR CKD-EPI (2021) MALE: 46 mL/min/{1.73_m2} — ABNORMAL LOW (ref >=60)
EGFR CKD-EPI (2021) MALE: 48 mL/min/{1.73_m2} — ABNORMAL LOW (ref >=60)
GLUCOSE RANDOM: 96 mg/dL (ref 70–179)
GLUCOSE RANDOM: 124 mg/dL (ref 70–179)
GLUCOSE RANDOM: 160 mg/dL (ref 70–179)
POTASSIUM: 4.9 mmol/L — ABNORMAL HIGH (ref 3.4–4.8)
POTASSIUM: 4.8 mmol/L (ref 3.4–4.8)
POTASSIUM: 5.1 mmol/L — ABNORMAL HIGH (ref 3.4–4.8)
SODIUM: 135 mmol/L (ref 135–145)
SODIUM: 133 mmol/L — ABNORMAL LOW (ref 135–145)
SODIUM: 135 mmol/L (ref 135–145)

## 2021-09-12 LAB — APTT
APTT: 42 s — ABNORMAL HIGH (ref 25.1–36.5)
APTT: 43.9 s — ABNORMAL HIGH (ref 25.1–36.5)
APTT: 45.3 s — ABNORMAL HIGH (ref 25.1–36.5)
HEPARIN CORRELATION: 0.2
HEPARIN CORRELATION: 0.3
HEPARIN CORRELATION: 0.3

## 2021-09-12 LAB — CBC
HEMATOCRIT: 26 % — ABNORMAL LOW (ref 39.0–48.0)
HEMATOCRIT: 27.1 % — ABNORMAL LOW (ref 39.0–48.0)
HEMOGLOBIN: 8.4 g/dL — ABNORMAL LOW (ref 12.9–16.5)
HEMOGLOBIN: 8.7 g/dL — ABNORMAL LOW (ref 12.9–16.5)
MEAN CORPUSCULAR HEMOGLOBIN CONC: 32.3 g/dL (ref 32.0–36.0)
MEAN CORPUSCULAR HEMOGLOBIN CONC: 31.9 g/dL — ABNORMAL LOW (ref 32.0–36.0)
MEAN CORPUSCULAR HEMOGLOBIN: 27.1 pg (ref 25.9–32.4)
MEAN CORPUSCULAR HEMOGLOBIN: 27.2 pg (ref 25.9–32.4)
MEAN CORPUSCULAR VOLUME: 84.1 fL (ref 77.6–95.7)
MEAN CORPUSCULAR VOLUME: 85.1 fL (ref 77.6–95.7)
MEAN PLATELET VOLUME: 12 fL — ABNORMAL HIGH (ref 6.8–10.7)
MEAN PLATELET VOLUME: 11.4 fL — ABNORMAL HIGH (ref 6.8–10.7)
PLATELET COUNT: 74 10*9/L — ABNORMAL LOW (ref 150–450)
PLATELET COUNT: 90 10*9/L — ABNORMAL LOW (ref 150–450)
RED BLOOD CELL COUNT: 3.09 10*12/L — ABNORMAL LOW (ref 4.26–5.60)
RED BLOOD CELL COUNT: 3.19 10*12/L — ABNORMAL LOW (ref 4.26–5.60)
RED CELL DISTRIBUTION WIDTH: 15.6 % — ABNORMAL HIGH (ref 12.2–15.2)
RED CELL DISTRIBUTION WIDTH: 15.8 % — ABNORMAL HIGH (ref 12.2–15.2)
WBC ADJUSTED: 22.2 10*9/L — ABNORMAL HIGH (ref 3.6–11.2)
WBC ADJUSTED: 29.2 10*9/L — ABNORMAL HIGH (ref 3.6–11.2)

## 2021-09-12 LAB — D-DIMER, QUANTITATIVE
D-DIMER QUANTITATIVE (CW,ML,HL,HS,CH,JS,JC): 3752 ng{FEU}/mL — ABNORMAL HIGH (ref <=500)
D-DIMER QUANTITATIVE (CW,ML,HL,HS,CH,JS,JC): 2419 ng{FEU}/mL — ABNORMAL HIGH (ref <=500)
D-DIMER QUANTITATIVE (CW,ML,HL,HS,CH,JS,JC): 2164 ng{FEU}/mL — ABNORMAL HIGH (ref <=500)

## 2021-09-12 LAB — PROTIME-INR
INR: 1.66
INR: 1.62
INR: 1.56
PROTIME: 19.2 s — ABNORMAL HIGH (ref 9.8–12.8)
PROTIME: 18.7 s — ABNORMAL HIGH (ref 9.8–12.8)
PROTIME: 17.9 s — ABNORMAL HIGH (ref 9.8–12.8)

## 2021-09-12 LAB — CBC W/ AUTO DIFF
BASOPHILS ABSOLUTE COUNT: 0 10*9/L (ref 0.0–0.1)
BASOPHILS RELATIVE PERCENT: 0.2 %
EOSINOPHILS ABSOLUTE COUNT: 0 10*9/L (ref 0.0–0.5)
EOSINOPHILS RELATIVE PERCENT: 0.1 %
HEMATOCRIT: 26.3 % — ABNORMAL LOW (ref 39.0–48.0)
HEMOGLOBIN: 8.5 g/dL — ABNORMAL LOW (ref 12.9–16.5)
LYMPHOCYTES ABSOLUTE COUNT: 0.4 10*9/L — ABNORMAL LOW (ref 1.1–3.6)
LYMPHOCYTES RELATIVE PERCENT: 1.7 %
MEAN CORPUSCULAR HEMOGLOBIN CONC: 32.3 g/dL (ref 32.0–36.0)
MEAN CORPUSCULAR HEMOGLOBIN: 27.2 pg (ref 25.9–32.4)
MEAN CORPUSCULAR VOLUME: 84 fL (ref 77.6–95.7)
MEAN PLATELET VOLUME: 11.8 fL — ABNORMAL HIGH (ref 6.8–10.7)
MONOCYTES ABSOLUTE COUNT: 0.4 10*9/L (ref 0.3–0.8)
MONOCYTES RELATIVE PERCENT: 2 %
NEUTROPHILS ABSOLUTE COUNT: 19.9 10*9/L — ABNORMAL HIGH (ref 1.8–7.8)
NEUTROPHILS RELATIVE PERCENT: 96 %
PLATELET COUNT: 70 10*9/L — ABNORMAL LOW (ref 150–450)
RED BLOOD CELL COUNT: 3.13 10*12/L — ABNORMAL LOW (ref 4.26–5.60)
RED CELL DISTRIBUTION WIDTH: 15.7 % — ABNORMAL HIGH (ref 12.2–15.2)
WBC ADJUSTED: 20.8 10*9/L — ABNORMAL HIGH (ref 3.6–11.2)

## 2021-09-12 LAB — MAGNESIUM
MAGNESIUM: 1.9 mg/dL (ref 1.6–2.6)
MAGNESIUM: 1.8 mg/dL (ref 1.6–2.6)
MAGNESIUM: 1.8 mg/dL (ref 1.6–2.6)

## 2021-09-12 LAB — PHOSPHORUS
PHOSPHORUS: 3.1 mg/dL (ref 2.4–5.1)
PHOSPHORUS: 3 mg/dL (ref 2.4–5.1)
PHOSPHORUS: 2.6 mg/dL (ref 2.4–5.1)

## 2021-09-12 LAB — CORTISOL: CORTISOL TOTAL: 212.4 ug/dL

## 2021-09-12 LAB — FIBRINOGEN
FIBRINOGEN LEVEL: 555 mg/dL — ABNORMAL HIGH (ref 175–500)
FIBRINOGEN LEVEL: 518 mg/dL — ABNORMAL HIGH (ref 175–500)
FIBRINOGEN LEVEL: 568 mg/dL — ABNORMAL HIGH (ref 175–500)

## 2021-09-12 LAB — TACROLIMUS LEVEL, TROUGH: TACROLIMUS, TROUGH: 4.6 ng/mL — ABNORMAL LOW (ref 5.0–15.0)

## 2021-09-12 LAB — HEPATIC FUNCTION PANEL
ALBUMIN: 1.7 g/dL — ABNORMAL LOW (ref 3.4–5.0)
ALKALINE PHOSPHATASE: 404 U/L — ABNORMAL HIGH (ref 46–116)
ALT (SGPT): 46 U/L (ref 10–49)
AST (SGOT): 191 U/L — ABNORMAL HIGH (ref <=34)
BILIRUBIN DIRECT: 1.6 mg/dL — ABNORMAL HIGH (ref 0.00–0.30)
BILIRUBIN TOTAL: 1.9 mg/dL — ABNORMAL HIGH (ref 0.3–1.2)
PROTEIN TOTAL: 5.1 g/dL — ABNORMAL LOW (ref 5.7–8.2)

## 2021-09-12 MED ADMIN — NxStage/multiBic RFP 401 (+/- BB) 5000 mL - contains 4 mEq/L of potassium dialysis solution 5,000 mL: 5000 mL | INTRAVENOUS_CENTRAL | @ 19:00:00

## 2021-09-12 MED ADMIN — aspirin chewable tablet 81 mg: 81.0 mg | GASTROENTERAL | @ 14:00:00

## 2021-09-12 MED ADMIN — NxStage/multiBic RFP 401 (+/- BB) 5000 mL - contains 4 mEq/L of potassium dialysis solution 5,000 mL: 5000 mL | INTRAVENOUS_CENTRAL | @ 11:00:00

## 2021-09-12 MED ADMIN — magnesium oxide (MAG-OX) tablet 400 mg: 400 mg | GASTROENTERAL | @ 13:00:00

## 2021-09-12 MED ADMIN — chlorhexidine (PERIDEX) 0.12 % solution 5 mL: 5 mL | OROMUCOSAL | @ 12:00:00

## 2021-09-12 MED ADMIN — norepinephrine 8 mg in dextrose 5 % 250 mL (32 mcg/mL) infusion PMB: 0-30 ug/min | INTRAVENOUS | @ 14:00:00

## 2021-09-12 MED ADMIN — cellulose, oxidized reg 4"X 8" pad: TOPICAL | @ 03:00:00 | Stop: 2021-09-11

## 2021-09-12 MED ADMIN — acetaminophen (TYLENOL) tablet 1,000 mg: 1000 mg | GASTROENTERAL | @ 13:00:00 | Stop: 2021-10-07

## 2021-09-12 MED ADMIN — NxStage/multiBic RFP 401 (+/- BB) 5000 mL - contains 4 mEq/L of potassium dialysis solution 5,000 mL: 5000 mL | INTRAVENOUS_CENTRAL | @ 03:00:00

## 2021-09-12 MED ADMIN — vancomycin (VANCOCIN) IVPB 1000 mg (premix): 1000 mg | INTRAVENOUS | @ 22:00:00 | Stop: 2021-09-19

## 2021-09-12 MED ADMIN — cellulose, oxidized reg 1"X 2" pad (SURGICEL FIBRILLAR): TOPICAL | @ 15:00:00 | Stop: 2021-09-12

## 2021-09-12 MED ADMIN — acetaminophen (TYLENOL) tablet 1,000 mg: 1000 mg | GASTROENTERAL | @ 06:00:00 | Stop: 2021-10-07

## 2021-09-12 MED ADMIN — cefepime (MAXIPIME) 1 g in sodium chloride 0.9 % (NS) 100 mL IVPB-connector bag: 1 g | INTRAVENOUS | @ 18:00:00 | Stop: 2021-09-15

## 2021-09-12 MED ADMIN — levothyroxine (SYNTHROID) tablet 250 mcg: 250 ug | GASTROENTERAL | @ 10:00:00

## 2021-09-12 MED ADMIN — vancomycin (VANCOCIN) IVPB 1000 mg (premix): 1000 mg | INTRAVENOUS | @ 01:00:00 | Stop: 2021-09-11

## 2021-09-12 MED ADMIN — acetaminophen (TYLENOL) tablet 1,000 mg: 1000 mg | GASTROENTERAL | @ 22:00:00 | Stop: 2021-10-07

## 2021-09-12 MED ADMIN — levoFLOXacin (LEVAQUIN) 500 mg/100 mL IVPB 500 mg: 500 mg | INTRAVENOUS | @ 20:00:00 | Stop: 2021-09-15

## 2021-09-12 MED ADMIN — atorvastatin (LIPITOR) tablet 20 mg: 20 mg | GASTROENTERAL | @ 01:00:00

## 2021-09-12 MED ADMIN — tacrolimus (PROGRAF) capsule 1 mg: 1 mg | ORAL | @ 01:00:00

## 2021-09-12 MED ADMIN — heparin (porcine) 5,000 unit/mL injection 5,000 Units: 5000 [IU] | SUBCUTANEOUS | @ 13:00:00

## 2021-09-12 MED ADMIN — cefepime (MAXIPIME) 1 g in sodium chloride 0.9 % (NS) 100 mL IVPB-connector bag: 1 g | INTRAVENOUS | @ 02:00:00 | Stop: 2021-09-15

## 2021-09-12 MED ADMIN — hydrocortisone sod succ (Solu-CORTEF) injection 50 mg: 50 mg | INTRAVENOUS | @ 22:00:00

## 2021-09-12 MED ADMIN — tacrolimus (PROGRAF) capsule 1 mg: 1 mg | ORAL | @ 13:00:00 | Stop: 2021-09-12

## 2021-09-12 MED ADMIN — esomeprazole (NexIUM) granules 40 mg: 40 mg | GASTROENTERAL | @ 10:00:00

## 2021-09-12 MED ADMIN — white petrolatum-mineral oil (SOOTHE PM) 80-20 % ophthalmic ointment 1 application: 1 | OPHTHALMIC | @ 01:00:00

## 2021-09-12 MED ADMIN — calcitRIOL (ROCALTROL) oral solution: .25 ug | GASTROENTERAL | @ 17:00:00

## 2021-09-12 MED ADMIN — magnesium oxide (MAG-OX) tablet 400 mg: 400 mg | GASTROENTERAL | @ 01:00:00

## 2021-09-12 MED ADMIN — cefepime (MAXIPIME) 1 g in sodium chloride 0.9 % (NS) 100 mL IVPB-connector bag: 1 g | INTRAVENOUS | @ 10:00:00 | Stop: 2021-09-15

## 2021-09-12 MED ADMIN — hydrocortisone sod succ (Solu-CORTEF) injection 50 mg: 50 mg | INTRAVENOUS | @ 03:00:00

## 2021-09-12 MED ADMIN — hydrocortisone sod succ (Solu-CORTEF) injection 50 mg: 50 mg | INTRAVENOUS | @ 10:00:00 | Stop: 2021-09-12

## 2021-09-12 MED ADMIN — chlorhexidine (PERIDEX) 0.12 % solution 5 mL: 5 mL | OROMUCOSAL | @ 01:00:00

## 2021-09-12 MED ADMIN — heparin (porcine) 5,000 unit/mL injection 5,000 Units: 5000 [IU] | SUBCUTANEOUS | @ 20:00:00

## 2021-09-12 MED ADMIN — white petrolatum-mineral oil (SOOTHE PM) 80-20 % ophthalmic ointment 1 application: 1 | OPHTHALMIC | @ 14:00:00

## 2021-09-12 NOTE — Unmapped (Addendum)
Respiratory Safety Check    Date: 09/11/2021     ??? PPV Device at bedside:   [x]  Yes  []  Replaced  []  N/A    ??? PPV mask at bedside:  [x]  Yes  []  Replaced  []  N/A    ??? Checked humidifier, inspiratory limb, and expiratory limb:   [x]  Yes  []  Replaced  []  N/A    ??? Backup trachs and supplies available at bedside:  []  Yes  []  Replaced  [x]  N/A    ??? Documented trach airway form visible at bedside:   []  Yes  []  Replaced  [x]  N/A    ??? iNO connected with PPV device:  []  Yes  []  Replaced  [x]  N/A

## 2021-09-12 NOTE — Unmapped (Signed)
Bronchoscopy Procedure Note    Procedure:  1. Bronchoscopy, Diagnostic  2. Bronchoalveolar lavage, BAL    Pre-Operative Diagnosis: pneumonia    Post-Operative Diagnosis: Same    Indication: acute hypoxemic respiratory failure, right sided pneumonia    Anesthesia: local only (1% lidocaine, 4 ml)    Procedure Details: Patient's HCP (wife) was consented for the procedure with all risk and benefit of the procedure explained in detail. HCP was given the opportunity to ask questions and all concerns were answered.  The bronchocope was inserted into the main airway via the endotracheal tube. An anatomical survey was done of the main airways to the segmental bronchi. Moderately think, blood tinged mucus was present in the ETT and trachea, suctioned free. Much less mucus visible in more distal airways, though all airways somewhat edematous and erythematous.  A bronchoalveolar lavage was performed using aliquots of normal saline instilled into the RUL then aspirated back. Return was cloudy and blood tinged. The patient tolerated the procedure well.    Estimated Blood Loss:  Minimal           Specimens:  Sent serosanguinous fluid for micro, cell counts, cytology                Complications:  None; patient tolerated the procedure well.           Disposition: ICU - intubated and critically ill.    Cecelia Byars, MD

## 2021-09-12 NOTE — Unmapped (Signed)
MICU Daily Progress Note     Date of Service: 09/12/2021    Problem List:   Principal Problem:    Acute respiratory distress syndrome (ARDS) due to COVID-19 virus (CMS-HCC)  Active Problems:    Hypercholesterolemia    Hypertension    History of kidney transplant    Renal cell carcinoma (CMS-HCC)    Immunosuppression-related infectious disease (CMS-HCC)    Bilateral lower extremity edema    Elevated troponin I level    GERD (gastroesophageal reflux disease)    Diastolic heart failure (CMS-HCC)    Acute on chronic HFrEF (heart failure with reduced ejection fraction) (CMS-HCC)    COVID    ATN (acute tubular necrosis) (CMS-HCC)    Acute renal failure with tubular necrosis (CMS-HCC)  Resolved Problems:    * No resolved hospital problems. *      Interval history: Miguel Ward is a 70 y.o. male renal transplant from lupus nephritis (complex course over last 15 years since transplant followed here including renal cell) here with acute hypoxic respiratory failure, COVID 19 and shock with AKI.    Interval events:  - UF 10 given increased pressor after pausing CRRT for scans >> improved with restarting CRRT  - Bronched and restarted vanc    Neurological   #acute metabolic encephalopathy  - neuro checks: q4h, arouses to voice, neuro improving  - analegsia prn  - arouses to voice, and intermittently FC   - CT head 9/29 no acute findings    Pulmonary   #ARDS, Acute resp failure   - tolerating PSV and passed SBT again this morning, however holding off on extubation until mental status improves with better cough.    - bronched 9/29 secretions blood tinged in trach and mainstem airways, none seen in distal airways    Cardiovascular   #septic shock, elevated trop  #HFrEF, ho htn, hld, cad  - arrived from ED on levo 26--> weaned to 2 on 9/27 however since then has had a higher pressure requirement  - MAP goals >65  - formal echo 9/26 EF @ 30%, 2020 ECHO 40-45%  - cont home regimen asa statin  - hold home regimen metop, vasotec, lasix  - SDS 9/26--> taper 50 q12h    Renal   #AKI (baseline Cr 2.7-3), AGMA likely ATN 2/2 sepsis and hypotension  #hx renal transplant s/t lupus nephritis, c/b rejection and renal cell CA s/p cryo ablation  - foley for accurate I/O during critical illness   - worsening renal fxn and CRRT started 9/26. UF decreased to 10 with increased pressors requirement, improved after restarting CRRT, able to increase 50 and will increase to 100 this afternoon  - resumed tac yesterday with goal of 2-4   - continue to hold cellcept     Infectious Disease/Autoimmune   #COVID 19 infection  Symptom onset:??9/22  Exposure: unknown  COVID PCR+: neg x 3  (home test positive 9/23?)  Day of admission/transfer: 9/25  COVID Specific??Meds:??none given   Vaccination status: full & boosted x once, evushield given 8/17  3 PCRs since admission negative and taken off precautions   ??  9/25 Bcx neg   9/25 Ucx pending, UA 3 WBC  9/25 Lower resp cx, pending, OPF  9/25 SARS-CoV-2 negative for NP and trach aspirate  9/25 CrAg negative  9/26 RPP negative  9/26 Legionella Ag (urine) positive  9/26 Legionella PCR positive  9/26 CMV PCR negative  ??  Multifocal PNA:   - started on vanco/cefepime on  9/25  - MRSA screen neg and vanco stopped 9/27, restarted 9/29 in the setting of worsening pressor requirements and GPC seen on BAL from Bronch on 9/29  - cont cefepime 9/25-->   - CT Chest 9/29      Legionella, PNA   - levofloxacin started 9/26  ??  Cultures:  Blood Culture, Routine (no units)   Date Value   09/05/2021 No Growth at 4 days   08/28/2021 No Growth at 4 days     Urine Culture, Comprehensive (no units)   Date Value   08/20/2021 NO GROWTH     Lower Respiratory Culture (no units)   Date Value   09/11/2021 Specimen Not Processed   09/08/2021 OROPHARYNGEAL FLORA ISOLATED     WBC (10*9/L)   Date Value   09/12/2021 20.8 (H)     WBC, UA (/HPF)   Date Value   08/20/2021 <1        FEN/GI   #gerd  -  TF, adv to goal as tol  - ppi for gi ppx  - bowel regimen w miralax held for loose stools    Malnutrition Assessment: Not done yet.  Body mass index is 22.68 kg/m??.        Heme/Coag     - was on heparin gtt for NSTEMI and afib- stopped 9/27  - stopped ppx with heparin in CRRT circuit   - started on SQH today for DVT ppx    Endocrine   #hypoglycemic, hypothryoid  - glucose within target range yes  - cont ADI + accu checks   - hold home regimen allopurinol  - cont home regimen synthroid    Integumentary   #  - WOCN consulted for high risk skin assessment No. Reason: no alterations in skin.  - WOCN recs >> pending , agree with assmt and plan   - cont pressure mitigating precautions per skin policy    Prophylaxis/LDA/Restraints/Consults   Can CVC be removed? No: dialysis catheter   Can A-line be removed? No: >moderate dose pressors  Can Foley be removed? No: Need continuous I/O  Mobility plan: Step 1 - Range of motion    Feeding: Trickle feeds, advance as tolerated  Analgesia: Pain adequately controlled  Sedation SAT/SBT: Yes  Thromboembolic ppx: SQ heparin  Head of bed >30 degrees: Yes  Ulcer ppx: Yes, home use continued  Glucose within target range: Yes, in range    Does patient need/have an active type/screen? Yes    RASS at goal? No - adjusting sedation or order to reflect need  [place] Agitation Assessment Scale (RASS) : -3 (09/12/2021  6:00 AM)     Can antipsychotics be stopped? N/A, not on antipsychotics  CAM-ICU Result: Positive (09/10/2021  8:00 PM)      Would hospice care be appropriate for this patient? No, patient improving or expected to improve  Any unaddressed hospice/palliative care needs? no    Patient Lines/Drains/Airways Status     Active Active Lines, Drains, & Airways     Name Placement date Placement time Site Days    ETT  7.5 08/24/2021  1337  -- 4    Hemodialysis Catheter With Distal Infusion Port 09/08/21 Right Internal jugular 09/08/21  1600  Internal jugular  3    NG/OG Tube Decompression 16 Fr. Center mouth 08/16/2021  1500  Center mouth  4    Urethral Catheter Temperature probe 16 Fr. 09/05/2021  1602  Temperature probe  4    Peripheral IV 09/10/21 Anterior;Distal;Left;Upper Arm  09/10/21  1730  Arm  1    Arteriovenous Fistula - Vein Graft  Access 11/01/18 Right;Upper Arm 11/01/18  --  Arm  1046              Patient Lines/Drains/Airways Status     Active Wounds     Name Placement date Placement time Site Days    Wound 09/09/21 Other (comment) Sacrum Right dark red nonblanching with dark skin surrounding 09/09/21  2130  Sacrum  2                Goals of Care     Code Status: Full Code    Designated Healthcare Decision Maker:  Mr. Wilbert current decisional capacity for healthcare decision-making is Full capacity. His designated healthcare decision maker(s) is/are   HCDM (patient stated preference): Trueheart,Katherine - Spouse - [phone].    Subjective   Intubated and non verbal    Objective     Vitals - past 24 hours  Heart Rate:  [91-129] 103  SpO2 Pulse:  [87-128] 101  Resp:  [15-34] 22  BP: (74-159)/(34-80) 118/60  FiO2 (%):  [35 %-100 %] 35 %  SpO2:  [90 %-100 %] 97 % Intake/Output  I/O last 3 completed shifts:  In: 3216.3 [I.V.:604.9; NG/GT:1585; IV Piggyback:1026.4]  Out: 994 [Urine:35; Other:959]     Physical Exam:    General: thin elderly male, intubated, in NAD   HEENT: PERRL  CV: RRR, no m/r/g, +2 pitting BLE edema   Pulm: diminished on right, no wheezing, course crackles through out  GI: abd round, non tender  MSK: grossly intact, tone intact  Skin: grossly intact, warm  Neuro: mae spontaneously, opens eyes to voice. Following simple commands       Continuous Infusions:   ??? norepinephrine bitartrate-NS 2 mcg/min (09/12/21 0615)   ??? NxStage RFP 400 (+/- BB) 5000 mL - contains 2 mEq/L of potassium     ??? NxStage/multiBic RFP 401 (+/- BB) 5000 mL - contains 4 mEq/L of potassium         Scheduled Medications:   ??? acetaminophen  1,000 mg Enteral tube: gastric  Q8H   ??? aspirin  81 mg Enteral tube: gastric  Daily   ??? atorvastatin  20 mg Enteral tube: gastric Nightly   ??? calcitrioL  0.25 mcg Enteral tube: gastric  Q MWF   ??? Cefepime  1 g Intravenous [facility]   ??? chlorhexidine  5 mL Mouth BID   ??? esomeprazole  40 mg Enteral tube: gastric  daily   ??? hydrocortisone sod succ  50 mg Intravenous Q6H SCH   ??? flu vacc qs2022-23 6mos up(PF)  0.5 mL Intramuscular During hospitalization   ??? levoFLOXacin  500 mg Intravenous Q24H   ??? levothyroxine  250 mcg Enteral tube: gastric  daily   ??? magnesium oxide  400 mg Enteral tube: gastric  BID   ??? tacrolimus  1 mg Oral BID   ??? vancomycin  1,000 mg Intravenous Q24H   ??? white petrolatum-mineral oil  1 application Both Eyes BID       PRN medications:  dextrose in water, heparin (porcine), heparin (porcine), ondansetron    Data/Imaging Review: Reviewed in Epic and personally interpreted on 09/12/2021. See EMR for detailed results.       Critical Care Attestation     This patient is critically ill or injured with the impairment of vital organ systems such that there is a high probability of imminent or life threatening deterioration in  the patient's condition. This patient must remain in the ICU for ongoing evaluation of the comprehensive management plan outlined in this note. I directly provided critical care services as documented in this note and the critical care time spent (50  min) is exclusive of separately billable procedures.  In addition to time spent for critical care management, I also provided advance care planning services for 0  minutes (see ACP note for details)???. Total billable critical care time 50  minutes.    Toma Aran, PA

## 2021-09-12 NOTE — Unmapped (Signed)
Vancomycin Therapeutic Monitoring Pharmacy Note    Miguel Ward is a 70 y.o. male continuing vancomycin. Date of therapy initiation: 09/12/2021, discontinued and resumed again 9/29    Indication: Sepsis, unknown source    Prior Dosing Information: 1500mg  x 1 in ED 9/25     Goals:  Therapeutic Drug Levels  Vancomycin trough goal: 10-20 mg/L    Additional Clinical Monitoring/Outcomes  Renal function, volume status (intake and output)      Wt Readings from Last 1 Encounters:   08/18/2021 71.7 kg (158 lb 1.1 oz)     Creatinine   Date Value Ref Range Status   09/11/2021 1.81 (H) 0.60 - 1.10 mg/dL Final   29/56/2130 8.65 (H) 0.60 - 1.10 mg/dL Final   78/46/9629 5.28 (H) 0.60 - 1.10 mg/dL Final       Pharmacokinetic Considerations and Significant Drug Interactions:  ??? on CRRT with estimated Ke ~ 0.0293  ??? Concurrent nephrotoxic meds: not applicable    Assessment/Plan:  Recommendation(s)  ??? Start vancomycin 1000mg  every 24 hours  ??? Estimated trough on recommended regimen: 10-20 mg/L    Follow-up  ??? Level due: prior to fourth or fifth dose  ??? A pharmacist will continue to monitor and order levels as appropriate    Please page service pharmacist with questions/clarifications.    Ulyses Southward, PharmD

## 2021-09-12 NOTE — Unmapped (Signed)
Tacrolimus Therapeutic Monitoring Pharmacy Note    Fahd Galea is a 70 y.o. male continuing tacrolimus.     Indication: Kidney transplant     Date of Transplant: 06/11/2007      Prior Dosing Information: Home regimen 3 mg BID      Goals:  Therapeutic Drug Levels  Tacrolimus trough goal: 2-4 mg/mL -- reduced in the setting of respiratory failure 2/2 legionella pneumonia    Additional Clinical Monitoring/Outcomes  ?? Monitor renal function (SCr and urine output) and liver function (LFTs)  ?? Monitor for signs/symptoms of adverse events (e.g., hyperglycemia, hyperkalemia, hypomagnesemia, hypertension, headache, tremor)    Results:   Tacrolimus level: 4.6 ng/mL, drawn appropriately, downtrending from 12.9 > 9.8 > 7.8 > 5.2 > 4.6 over the last 72 hours    Pharmacokinetic Considerations and Significant Drug Interactions:  ??? Concurrent hepatotoxic medications: None identified  ??? Concurrent CYP3A4 substrates/inhibitors: None identified  ??? Concurrent nephrotoxic medications: Lasix, acyclovir    Assessment/Plan:  Recommendedation(s)  ??? Upon discussion with transplant nephrology, will continue tacrolimus 1 mg BID     Follow-up  ??? Daily levels for now.   ??? A pharmacist will continue to monitor and recommend levels as appropriate    Please page service pharmacist with questions/clarifications.    Kennis Carina, PharmD

## 2021-09-12 NOTE — Unmapped (Signed)
CVAD Liaison - Bleeding CVAD Note    CVAD Liaison Nurse assessed the patient at the bed side. The patient has a(n) trialysis catheter in right IJ.     Bloody drainage was observed. Manual pressure was held for 10 minutes. Surgicel dressing was applied aseptically. Please call/page CVAD Liaison for any issues with this dressing or questions. Report given to the primary RN.    Thank you for this consult,  Elizebeth Brooking RN, CVAD Liaison    Consult Time  45 minutes (min)

## 2021-09-12 NOTE — Unmapped (Signed)
Respiratory Safety Check    Date: 09/11/2021     ??? PPV Device at bedside:   [x]  Yes  []  Replaced  []  N/A    ??? PPV mask at bedside:  [x]  Yes  []  Replaced  []  N/A    ??? Checked humidifier, inspiratory limb, and expiratory limb:   [x]  Yes  []  Replaced  []  N/A    ??? Backup trachs and supplies available at bedside:  []  Yes  []  Replaced  [x]  N/A    ??? Documented trach airway form visible at bedside:   []  Yes  []  Replaced  [x]  N/A    ??? iNO connected with PPV device:  []  Yes  []  Replaced  [x]  N/A

## 2021-09-12 NOTE — Unmapped (Signed)
Respiratory Safety Check    Date: 09/12/2021     PPV Device at bedside:   [x]  Yes  []  Replaced  []  N/A    PPV mask at bedside:  [x]  Yes  []  Replaced  []  N/A    Checked humidifier, inspiratory limb, and expiratory limb:   [x]  Yes  []  Replaced  []  N/A    Backup trachs and supplies available at bedside:  []  Yes  []  Replaced  [x]  N/A    Documented trach airway form visible at bedside:   []  Yes  []  Replaced  [x]  N/A    iNO connected with PPV device:  []  Yes  []  Replaced  [x]  N/A

## 2021-09-12 NOTE — Unmapped (Signed)
MICU Nightshift Note     Date of Service: 09/12/2021    Principal Problem:    Acute respiratory distress syndrome (ARDS) due to COVID-19 virus (CMS-HCC)  Active Problems:    Hypercholesterolemia    Hypertension    History of kidney transplant    Renal cell carcinoma (CMS-HCC)    Immunosuppression-related infectious disease (CMS-HCC)    Bilateral lower extremity edema    Elevated troponin I level    GERD (gastroesophageal reflux disease)    Diastolic heart failure (CMS-HCC)    Acute on chronic HFrEF (heart failure with reduced ejection fraction) (CMS-HCC)    COVID    ATN (acute tubular necrosis) (CMS-HCC)    Acute renal failure with tubular necrosis (CMS-HCC)  Resolved Problems:    * No resolved hospital problems. *          Cross cover summary       No acute changes overnight.     Esther Hardy, MD

## 2021-09-12 NOTE — Unmapped (Signed)
Patient remained on ventilator throughout this shift at documented settings. Patient transported to and from CT without incidence. Patient bronch this shift without adverse reaction: see documented settings during bronch. Respiratory safety checks completed: see notes. ETT patent and secure. Respiratory to continue to monitor.

## 2021-09-12 NOTE — Unmapped (Signed)
CVAD Liaison - Bleeding CVAD Note    CVAD Liaison Nurse assessed the patient at the bed side. The patient has a(n) trialysis catheter in right IJ.     Bloody drainage was observed. Manual pressure was held for 5   minutes. Surgicel dressing was applied aseptically. Please call/page CVAD Liaison for any issues with this dressing or questions. Report given to the primary RN.    Thank you for this consult,  Caren Hazy RN, CVAD Liaison    Consult Time  30 minutes (min)

## 2021-09-12 NOTE — Unmapped (Signed)
WOCN Consult Services                                                 Wound Evaluation    Reason for Consult:   - Follow-up  - Pressure Injury    Problem List:   Principal Problem:    Acute respiratory distress syndrome (ARDS) due to COVID-19 virus (CMS-HCC)  Active Problems:    Hypercholesterolemia    Hypertension    History of kidney transplant    Renal cell carcinoma (CMS-HCC)    Immunosuppression-related infectious disease (CMS-HCC)    Bilateral lower extremity edema    Elevated troponin I level    GERD (gastroesophageal reflux disease)    Diastolic heart failure (CMS-HCC)    Acute on chronic HFrEF (heart failure with reduced ejection fraction) (CMS-HCC)    COVID    ATN (acute tubular necrosis) (CMS-HCC)    Acute renal failure with tubular necrosis (CMS-HCC)    Assessment: Follow up assessment of wound on sacrum concerning for pressure injury. Per EMR, patient admitted on 09/25/21 with a history of renal transplant from lupus nephritis (complex course over last 15 years since transplant followed here including renal cell) here with acute hypoxic respiratory failure, COVID 19 and shock with AKI. Per H&P, patient found unresponsive, hypoxic and hypoglycemic at home.     Patient assessed in MICU. Per RN, his bowel movements have slowed down and his tube feeds have been changed.      The darkened skin with epidermal lift noted on previous assessment continues with a well defined borders. Epidermal lift continues and dark red tissue is nonblanching. Still unable to assess for pain with palpation. Validated that skin changes are consistent with a deep tissue pressure injury, likely occurring PTA given his condition he was found in the home. Sacral silicone bordered foam dressing continued. Patient turned with wedges and heel offloading boots in place. No other wounds noted.            09/12/21 1015   Wound 09/09/21 Pressure Injury Sacrum Right;Mid dark red nonblanching with dark skin surrounding DTI   Date First Assessed/Time First Assessed: 09/09/21 2130   Present on Hospital Admission: Yes  Primary Wound Type: Pressure Injury  Location: Sacrum  Wound Location Orientation: Right;Mid  Wound Description (Comments): dark red nonblanching with dark sk...   Wound Image    Dressing Status      Changed   Wound Length (cm) 4.5 cm   Wound Width (cm) 6 cm   Wound Depth (cm) 0.1 cm   Wound Surface Area (cm^2) 27 cm^2   Wound Volume (cm^3) 2.7 cm^3   Wound Healing % -13   Wound Bed Red  (dark red nonblanching)   Odor None   Peri-wound Assessment      Intact   Exudate Type      Sero-sanguineous   Exudate Amnt      Scant   Tunneling      No   Undermining     No   Treatments Cleansed/Irrigation   Dressing Silicone foam bordered dressing       Continence Status:   Incontinence of bladder: Foley in place  Incontinent of bowel: incontinence pad    Moisture Associated Skin Damage:   - none noted     Lab Results   Component Value Date  WBC 20.8 (H) 09/12/2021    HGB 8.5 (L) 09/12/2021    HCT 26.3 (L) 09/12/2021    CRP 317.0 (H) 09/08/2021    A1C 6.3 (H) 07/30/2021    GLUF 92 01/22/2015    GLU 96 09/12/2021    POCGLU 110 09/12/2021    ALBUMIN 1.7 (L) 09/12/2021    PROT 5.1 (L) 09/12/2021     Risk Factors:   - Friction/shear  - Immobility  - Lack of sensory perception  - Moisture  - Multiple co-morbidities  - unresponsive prior to admission    Braden Scale Score: 14         Support Surface:   - Low Air Loss - ICU    Type Debridement Completed By WOCN:  N/A    Teaching:  - n/a    WOCN Recommendations:   - See nursing orders for wound care instructions.  - Contact WOCN with questions, concerns, or wound deterioration.  - continue pressure injury prevention interventions and nutrition support.   - continue silicone bordered foam dressing    Topical Therapy/Interventions:   - Silicone bordered foam    Recommended Consults:  - Not Applicable    WOCN Follow Up:  - Weekly    Plan of Care Discussed With:   - RN primary  - LIP Dr. Tresa Res    Supplies Ordered: No    Workup Time:   45 minutes   Reconsult for questions/concerns/deterioration.   PAGE VIA  [facility] Directory  Available by Epic Chat   Renaye Rakers RN, BSN, CWOCN

## 2021-09-12 NOTE — Unmapped (Signed)
Continuous Renal Replacement  Dialysis Nurse Therapy Procedure Note    Treatment Type:  [facility] Number Of Days On Therapy:  0 Procedure Date:  09/11/2021 7:08 PM     TREATMENT STATUS:  Continuous  Patient and Treatment Status     None          Active Dialysis Orders (168h ago, onward)     Start     Ordered    09/08/21 1428  CRRT Orders - NxStage (Adult)  Continuous        Comments: Fluid removal parameters:   MAP <60: 10 ml/hr;  MAP 61 - 65: 50 ml/hr;   MAP >65: 150 ml/hr   Question Answer Comment   CRRT System: NxStage    Modality: CVVH    Access: Other    BFR (mL/min): 200-350    Dialysate Flow Rate (mL/kg/hr): Other (Specify) 1.8L/hr   Fluid Removal Initial Rate (mL/hr): 10    Fluid Removal Parameters: See below        09/08/21 1429              SYSTEM CHECK:  Machine Name: [id number]  Dialyzer: [id number]   Self Test Completed: Yes.        Alarms Connected To The Wall And Active:  Yes.    VITAL SIGNS:  Core Temp:  [36.3 ??C (97.3 ??F)-37.6 ??C (99.7 ??F)] 37.3 ??C (99.1 ??F)  Heart Rate:  [91-125] 102  SpO2 Pulse:  [87-114] 111  Resp:  [24-34] 26  SpO2:  [91 %-100 %] 100 %  BP: (81-159)/(34-74) 159/59  MAP (mmHg):  [54-91] 88    ACCESS SITE:        Hemodialysis Catheter With Distal Infusion Port 09/08/21 Right Internal jugular (Active)   Site Assessment Clean;Dry;Intact 09/11/21 1600   Proximal Lumen Status / Patency Infusing 09/11/21 0400   Proximal Lumen Intervention Accessed 09/11/21 1600   Medial Lumen Status / Patency Infusing 09/11/21 0400   Medial Lumen Intervention Accessed 09/11/21 1600   Lumen 3, Distal Status / Patency Infusing 09/11/21 1600   Distal Lumen Intervention Accessed 09/11/21 1600   IV Tubing and Needleless Injector Cap Change Due 09/13/21 09/11/21 1600   Dressing Type CHG gel;Transparent;Occlusive 09/11/21 1700   Dressing Status      Changed 09/11/21 1700   Dressing Intervention New dressing 09/11/21 1700   Contraindicated due to: Dressing Intact surrounding insertion site 09/11/21 1600   Dressing Change Due 09/18/21 09/11/21 1600   Line Necessity Reviewed? Y 09/11/21 1600   Line Necessity Indications Yes - Hemodialysis 09/11/21 1600   Line Necessity Reviewed With MDI 09/11/21 1600     Arteriovenous Fistula - Vein Graft  Access 11/01/18 Right;Upper Arm (Active)   Site Assessment Clean;Dry;Intact 09/11/21 1600   AV Fistula Thrill Present;Bruit Present 09/11/21 1600   Status Deaccessed 09/11/21 1600   Dressing Intervention No intervention needed 09/11/21 1600   Dressing Status      No dressing 09/11/21 1600   Site Condition No complications 09/11/21 1600   Dressing Open to air (None) 09/11/21 1600       CATHETER FILL VOLUMES:     Arterial: 1.6 mL  Venous: 1.6 mL     Lab Results   Component Value Date    NA 133 (L) 09/11/2021    K 4.7 09/11/2021    CL 100 09/11/2021    CO2 23.0 09/11/2021    BUN 34 (H) 09/11/2021     Lab Results   Component Value Date  CALCIUM 8.4 (L) 09/11/2021    CAION 4.55 09/10/2021    PHOS 2.9 09/11/2021    MG 1.9 09/11/2021        SETTINGS:  Blood Pump Rate: 350 mL/min  Replacement Fluid Rate:     Pre-Blood Pump Fluid Rate:    Hourly Fluid Removal Rate: 10 mL/hr   Dialysate Fluid Rate    Therapy Fluid Temperature:       ANTICOAGULANT:  None    ADDITIONAL COMMENTS:  None    HEMODIALYSIS ON-CALL NURSE PAGER NUMBER:  ?? Monday thru Saturday 0700 - 1730: Call the Dialysis Unit ext. [phone]   ?? After 1730 and all day Sunday: Call the Dialysis RN Pager Number [phone]     PROCEDURE REVIEW, VERIFICATION, HANDOFF:  CRRT settings verified, procedure reviewed, and instructions given to primary RN.     Primary CRRT RN Verifying: Vivi Martens RN Dialysis RN Verifying: C Tifani Dack RN

## 2021-09-13 LAB — HEPATIC FUNCTION PANEL
ALBUMIN: 1.6 g/dL — ABNORMAL LOW (ref 3.4–5.0)
ALKALINE PHOSPHATASE: 628 U/L — ABNORMAL HIGH (ref 46–116)
ALT (SGPT): 74 U/L — ABNORMAL HIGH (ref 10–49)
AST (SGOT): 269 U/L — ABNORMAL HIGH (ref <=34)
BILIRUBIN DIRECT: 1.3 mg/dL — ABNORMAL HIGH (ref 0.00–0.30)
BILIRUBIN TOTAL: 1.6 mg/dL — ABNORMAL HIGH (ref 0.3–1.2)
PROTEIN TOTAL: 4.9 g/dL — ABNORMAL LOW (ref 5.7–8.2)

## 2021-09-13 LAB — BASIC METABOLIC PANEL
ANION GAP: 7 mmol/L (ref 5–14)
ANION GAP: 7 mmol/L (ref 5–14)
ANION GAP: 7 mmol/L (ref 5–14)
BLOOD UREA NITROGEN: 31 mg/dL — ABNORMAL HIGH (ref 9–23)
BLOOD UREA NITROGEN: 35 mg/dL — ABNORMAL HIGH (ref 9–23)
BLOOD UREA NITROGEN: 33 mg/dL — ABNORMAL HIGH (ref 9–23)
BUN / CREAT RATIO: 20
BUN / CREAT RATIO: 24
BUN / CREAT RATIO: 23
CALCIUM: 8.5 mg/dL — ABNORMAL LOW (ref 8.7–10.4)
CALCIUM: 8.7 mg/dL (ref 8.7–10.4)
CALCIUM: 8.7 mg/dL (ref 8.7–10.4)
CHLORIDE: 102 mmol/L (ref 98–107)
CHLORIDE: 100 mmol/L (ref 98–107)
CHLORIDE: 101 mmol/L (ref 98–107)
CO2: 26 mmol/L (ref 20.0–31.0)
CO2: 26 mmol/L (ref 20.0–31.0)
CO2: 25 mmol/L (ref 20.0–31.0)
CREATININE: 1.54 mg/dL — ABNORMAL HIGH
CREATININE: 1.47 mg/dL — ABNORMAL HIGH
CREATININE: 1.45 mg/dL — ABNORMAL HIGH
EGFR CKD-EPI (2021) MALE: 49 mL/min/{1.73_m2} — ABNORMAL LOW (ref >=60)
EGFR CKD-EPI (2021) MALE: 52 mL/min/{1.73_m2} — ABNORMAL LOW (ref >=60)
EGFR CKD-EPI (2021) MALE: 53 mL/min/{1.73_m2} — ABNORMAL LOW (ref >=60)
GLUCOSE RANDOM: 174 mg/dL (ref 70–179)
GLUCOSE RANDOM: 201 mg/dL — ABNORMAL HIGH (ref 70–179)
GLUCOSE RANDOM: 229 mg/dL — ABNORMAL HIGH (ref 70–179)
POTASSIUM: 4.9 mmol/L — ABNORMAL HIGH (ref 3.4–4.8)
POTASSIUM: 5 mmol/L — ABNORMAL HIGH (ref 3.4–4.8)
POTASSIUM: 5 mmol/L — ABNORMAL HIGH (ref 3.4–4.8)
SODIUM: 135 mmol/L (ref 135–145)
SODIUM: 133 mmol/L — ABNORMAL LOW (ref 135–145)
SODIUM: 133 mmol/L — ABNORMAL LOW (ref 135–145)

## 2021-09-13 LAB — CBC
HEMATOCRIT: 26.1 % — ABNORMAL LOW (ref 39.0–48.0)
HEMATOCRIT: 25.1 % — ABNORMAL LOW (ref 39.0–48.0)
HEMOGLOBIN: 8.5 g/dL — ABNORMAL LOW (ref 12.9–16.5)
HEMOGLOBIN: 8 g/dL — ABNORMAL LOW (ref 12.9–16.5)
MEAN CORPUSCULAR HEMOGLOBIN CONC: 32.5 g/dL (ref 32.0–36.0)
MEAN CORPUSCULAR HEMOGLOBIN CONC: 31.9 g/dL — ABNORMAL LOW (ref 32.0–36.0)
MEAN CORPUSCULAR HEMOGLOBIN: 27.2 pg (ref 25.9–32.4)
MEAN CORPUSCULAR HEMOGLOBIN: 27.3 pg (ref 25.9–32.4)
MEAN CORPUSCULAR VOLUME: 83.6 fL (ref 77.6–95.7)
MEAN CORPUSCULAR VOLUME: 85.6 fL (ref 77.6–95.7)
MEAN PLATELET VOLUME: 11.1 fL — ABNORMAL HIGH (ref 6.8–10.7)
MEAN PLATELET VOLUME: 10.6 fL (ref 6.8–10.7)
PLATELET COUNT: 93 10*9/L — ABNORMAL LOW (ref 150–450)
PLATELET COUNT: 101 10*9/L — ABNORMAL LOW (ref 150–450)
RED BLOOD CELL COUNT: 3.12 10*12/L — ABNORMAL LOW (ref 4.26–5.60)
RED BLOOD CELL COUNT: 2.93 10*12/L — ABNORMAL LOW (ref 4.26–5.60)
RED CELL DISTRIBUTION WIDTH: 15.7 % — ABNORMAL HIGH (ref 12.2–15.2)
RED CELL DISTRIBUTION WIDTH: 15.7 % — ABNORMAL HIGH (ref 12.2–15.2)
WBC ADJUSTED: 31.4 10*9/L — ABNORMAL HIGH (ref 3.6–11.2)
WBC ADJUSTED: 36.6 10*9/L — ABNORMAL HIGH (ref 3.6–11.2)

## 2021-09-13 LAB — SLIDE REVIEW

## 2021-09-13 LAB — FIBRINOGEN
FIBRINOGEN LEVEL: 518 mg/dL — ABNORMAL HIGH (ref 175–500)
FIBRINOGEN LEVEL: 518 mg/dL — ABNORMAL HIGH (ref 175–500)
FIBRINOGEN LEVEL: 403 mg/dL (ref 175–500)

## 2021-09-13 LAB — MAGNESIUM
MAGNESIUM: 1.9 mg/dL (ref 1.6–2.6)
MAGNESIUM: 1.9 mg/dL (ref 1.6–2.6)
MAGNESIUM: 1.9 mg/dL (ref 1.6–2.6)

## 2021-09-13 LAB — PROTIME-INR
INR: 1.52
INR: 1.41
INR: 1.4
PROTIME: 17.5 s — ABNORMAL HIGH (ref 9.8–12.8)
PROTIME: 16.2 s — ABNORMAL HIGH (ref 9.8–12.8)
PROTIME: 16.1 s — ABNORMAL HIGH (ref 9.8–12.8)

## 2021-09-13 LAB — CBC W/ AUTO DIFF
BASOPHILS ABSOLUTE COUNT: 0.1 10*9/L (ref 0.0–0.1)
BASOPHILS RELATIVE PERCENT: 0.2 %
EOSINOPHILS ABSOLUTE COUNT: 0 10*9/L (ref 0.0–0.5)
EOSINOPHILS RELATIVE PERCENT: 0 %
HEMATOCRIT: 26.5 % — ABNORMAL LOW (ref 39.0–48.0)
HEMOGLOBIN: 8.7 g/dL — ABNORMAL LOW (ref 12.9–16.5)
LYMPHOCYTES ABSOLUTE COUNT: 0.5 10*9/L — ABNORMAL LOW (ref 1.1–3.6)
LYMPHOCYTES RELATIVE PERCENT: 1.7 %
MEAN CORPUSCULAR HEMOGLOBIN CONC: 32.7 g/dL (ref 32.0–36.0)
MEAN CORPUSCULAR HEMOGLOBIN: 27.3 pg (ref 25.9–32.4)
MEAN CORPUSCULAR VOLUME: 83.5 fL (ref 77.6–95.7)
MEAN PLATELET VOLUME: 11 fL — ABNORMAL HIGH (ref 6.8–10.7)
MONOCYTES ABSOLUTE COUNT: 0.6 10*9/L (ref 0.3–0.8)
MONOCYTES RELATIVE PERCENT: 2 %
NEUTROPHILS ABSOLUTE COUNT: 26.8 10*9/L — ABNORMAL HIGH (ref 1.8–7.8)
NEUTROPHILS RELATIVE PERCENT: 96.1 %
PLATELET COUNT: 79 10*9/L — ABNORMAL LOW (ref 150–450)
RED BLOOD CELL COUNT: 3.18 10*12/L — ABNORMAL LOW (ref 4.26–5.60)
RED CELL DISTRIBUTION WIDTH: 15.3 % — ABNORMAL HIGH (ref 12.2–15.2)
WBC ADJUSTED: 27.9 10*9/L — ABNORMAL HIGH (ref 3.6–11.2)

## 2021-09-13 LAB — D-DIMER, QUANTITATIVE
D-DIMER QUANTITATIVE (CW,ML,HL,HS,CH,JS,JC): 2001 ng{FEU}/mL — ABNORMAL HIGH (ref <=500)
D-DIMER QUANTITATIVE (CW,ML,HL,HS,CH,JS,JC): 1888 ng{FEU}/mL — ABNORMAL HIGH (ref <=500)
D-DIMER QUANTITATIVE (CW,ML,HL,HS,CH,JS,JC): 1766 ng{FEU}/mL — ABNORMAL HIGH (ref <=500)

## 2021-09-13 LAB — APTT
APTT: 46.5 s — ABNORMAL HIGH (ref 25.1–36.5)
APTT: 39.6 s — ABNORMAL HIGH (ref 25.1–36.5)
APTT: 48.5 s — ABNORMAL HIGH (ref 25.1–36.5)
HEPARIN CORRELATION: 0.3
HEPARIN CORRELATION: 0.2
HEPARIN CORRELATION: 0.3

## 2021-09-13 LAB — VANCOMYCIN, RANDOM: VANCOMYCIN RANDOM: 15.7 ug/mL

## 2021-09-13 LAB — PHOSPHORUS
PHOSPHORUS: 2.4 mg/dL (ref 2.4–5.1)
PHOSPHORUS: 2.2 mg/dL — ABNORMAL LOW (ref 2.4–5.1)
PHOSPHORUS: 2.2 mg/dL — ABNORMAL LOW (ref 2.4–5.1)

## 2021-09-13 LAB — TACROLIMUS LEVEL, TROUGH: TACROLIMUS, TROUGH: 4.3 ng/mL — ABNORMAL LOW (ref 5.0–15.0)

## 2021-09-13 MED ADMIN — hydrocortisone sod succ (Solu-CORTEF) injection 50 mg: 50 mg | INTRAVENOUS | @ 09:00:00 | Stop: 2021-09-13

## 2021-09-13 MED ADMIN — NxStage/multiBic RFP 401 (+/- BB) 5000 mL - contains 4 mEq/L of potassium dialysis solution 5,000 mL: 5000 mL | INTRAVENOUS_CENTRAL | @ 09:00:00

## 2021-09-13 MED ADMIN — cefepime (MAXIPIME) 1 g in sodium chloride 0.9 % (NS) 100 mL IVPB-connector bag: 1 g | INTRAVENOUS | @ 02:00:00 | Stop: 2021-09-15

## 2021-09-13 MED ADMIN — NxStage/multiBic RFP 401 (+/- BB) 5000 mL - contains 4 mEq/L of potassium dialysis solution 5,000 mL: 5000 mL | INTRAVENOUS_CENTRAL | @ 02:00:00

## 2021-09-13 MED ADMIN — aspirin chewable tablet 81 mg: 81.0 mg | GASTROENTERAL | @ 13:00:00

## 2021-09-13 MED ADMIN — acetaminophen (TYLENOL) tablet 1,000 mg: 1000 mg | GASTROENTERAL | @ 13:00:00 | Stop: 2021-10-07

## 2021-09-13 MED ADMIN — chlorhexidine (PERIDEX) 0.12 % solution 5 mL: 5 mL | OROMUCOSAL

## 2021-09-13 MED ADMIN — levothyroxine (SYNTHROID) tablet 250 mcg: 250 ug | GASTROENTERAL | @ 09:00:00

## 2021-09-13 MED ADMIN — white petrolatum-mineral oil (SOOTHE PM) 80-20 % ophthalmic ointment 1 application: 1 | OPHTHALMIC

## 2021-09-13 MED ADMIN — heparin (porcine) 5,000 unit/mL injection 5,000 Units: 5000 [IU] | SUBCUTANEOUS | @ 02:00:00

## 2021-09-13 MED ADMIN — heparin (porcine) 5,000 unit/mL injection 5,000 Units: 5000 [IU] | SUBCUTANEOUS | @ 09:00:00

## 2021-09-13 MED ADMIN — tacrolimus (PROGRAF) oral suspension: 1 mg | GASTROENTERAL

## 2021-09-13 MED ADMIN — acetaminophen (TYLENOL) tablet 1,000 mg: 1000 mg | GASTROENTERAL | @ 22:00:00 | Stop: 2021-10-07

## 2021-09-13 MED ADMIN — magnesium oxide (MAG-OX) tablet 400 mg: 400 mg | GASTROENTERAL | @ 13:00:00

## 2021-09-13 MED ADMIN — heparin (porcine) 5,000 unit/mL injection 5,000 Units: 5000 [IU] | SUBCUTANEOUS | @ 19:00:00

## 2021-09-13 MED ADMIN — tacrolimus (PROGRAF) oral suspension: 1 mg | GASTROENTERAL | @ 16:00:00

## 2021-09-13 MED ADMIN — acetaminophen (TYLENOL) tablet 1,000 mg: 1000 mg | GASTROENTERAL | @ 06:00:00 | Stop: 2021-10-07

## 2021-09-13 MED ADMIN — magnesium oxide (MAG-OX) tablet 400 mg: 400 mg | GASTROENTERAL

## 2021-09-13 MED ADMIN — chlorhexidine (PERIDEX) 0.12 % solution 5 mL: 5 mL | OROMUCOSAL | @ 13:00:00

## 2021-09-13 MED ADMIN — atorvastatin (LIPITOR) tablet 20 mg: 20 mg | GASTROENTERAL

## 2021-09-13 MED ADMIN — esomeprazole (NexIUM) granules 40 mg: 40 mg | GASTROENTERAL | @ 09:00:00

## 2021-09-13 MED ADMIN — white petrolatum-mineral oil (SOOTHE PM) 80-20 % ophthalmic ointment 1 application: 1 | OPHTHALMIC | @ 14:00:00

## 2021-09-13 MED ADMIN — cefepime (MAXIPIME) 1 g in sodium chloride 0.9 % (NS) 100 mL IVPB-connector bag: 1 g | INTRAVENOUS | @ 09:00:00 | Stop: 2021-09-16

## 2021-09-13 NOTE — Unmapped (Signed)
[facility] Nephrology Continuous Renal Replacement Therapy Procedure Note     09/12/2021    Miguel Ward was seen and examined on CRRT    CHIEF COMPLAINT: Acute Kidney Disease    INTERVAL HISTORY: Remains intubated on pressors. Held in-circuit heparin yesterday due to some oozing, CRRT has been running well.    CURRENT DIALYSIS PRESCRIPTION:  Device: CRRT Device: NxStage  Therapy fluid: Therapy Fluid : NxStage RFP 401 - Contains 4 mEq/L KCL  Therapy fluid rate: Therapy Fluid Rate (L/hr): 1.8 L/hr  Blood flow rate: Blood Pump Rate (mL/min): 350 mL/min  Fluid removal rate: Hourly Fluid Removal Rate (mL/hr): 100 mL/hr    PHYSICAL EXAM:  Vitals:  Temp:  [36 ??C (96.8 ??F)] 36 ??C (96.8 ??F)  Core Temp:  [35.8 ??C (96.4 ??F)-37.2 ??C (99 ??F)] 35.9 ??C (96.6 ??F)  Heart Rate:  [85-129] 85  SpO2 Pulse:  [79-128] 101  BP: (74-148)/(42-88) 119/74  MAP (mmHg):  [52-96] 89    In/Outs:    Intake/Output Summary (Last 24 hours) at 09/12/2021 2030  Last data filed at 09/12/2021 1900  Gross per 24 hour   Intake 1068.96 ml   Output 707 ml   Net 361.96 ml        Weights:  Admission Weight: 81.6 kg (180 lb)  Last documented Weight: 71.7 kg (158 lb 1.1 oz)  Weight Change from Previous Day: No weight listed for specified days    Assessment:   General: Appearing ill  Pulmonary: rhonchi  Cardiovascular: regular rate and rhythm  Extremities: no significant  edema  Access: Right IJ non-tunneled catheter     LAB DATA:  Lab Results   Component Value Date    NA 133 (L) 09/12/2021    K 4.8 09/12/2021    CL 102 09/12/2021    CO2 26.0 09/12/2021    BUN 32 (H) 09/12/2021    CREATININE 1.63 (H) 09/12/2021      Lab Results   Component Value Date    HCT 27.1 (L) 09/12/2021    HGB 8.7 (L) 09/12/2021    WBC 29.2 (H) 09/12/2021        ASSESSMENT/PLAN:  Acute Kidney Disease on Continuous Renal Replacement Therapy:  - UF goal: 50 mL/hr as tolerated  - Renally dose all medications    Drinda Butts, MD  [place] [facility]

## 2021-09-13 NOTE — Unmapped (Signed)
Respiratory Safety Check    Date: 09/12/2021     PPV Device at bedside:   [x]  Yes  []  Replaced  []  N/A    PPV mask at bedside:  [x]  Yes  []  Replaced  []  N/A    Checked humidifier, inspiratory limb, and expiratory limb:   [x]  Yes  []  Replaced  []  N/A    Backup trachs and supplies available at bedside:  []  Yes  []  Replaced  [x]  N/A    Documented trach airway form visible at bedside:   []  Yes  []  Replaced  [x]  N/A    iNO connected with PPV device:  []  Yes  []  Replaced  [x]  N/A

## 2021-09-13 NOTE — Unmapped (Signed)
MICU Daily Progress Note     Date of Service: 09/13/2021    Problem List:   Principal Problem:    Acute respiratory distress syndrome (ARDS) due to COVID-19 virus (CMS-HCC)  Active Problems:    Hypercholesterolemia    Hypertension    History of kidney transplant    Renal cell carcinoma (CMS-HCC)    Immunosuppression-related infectious disease (CMS-HCC)    Bilateral lower extremity edema    Elevated troponin I level    GERD (gastroesophageal reflux disease)    Diastolic heart failure (CMS-HCC)    Acute on chronic HFrEF (heart failure with reduced ejection fraction) (CMS-HCC)    COVID    ATN (acute tubular necrosis) (CMS-HCC)    Acute renal failure with tubular necrosis (CMS-HCC)  Resolved Problems:    * No resolved hospital problems. *      Interval history: Miguel Ward is a 70 y.o. male renal transplant from lupus nephritis (complex course over last 15 years since transplant followed here including renal cell) here with acute hypoxic respiratory failure, COVID 19 and shock with AKI.    Interval events:  - UF 100 tolerated well, net - 118 in the last 24 hours  - levo weaned ofd but then restarted at 2  - LFTs continue to uptrending    Neurological   #acute metabolic encephalopathy  - neuro checks: q4h, arouses to voice, neuro improving  - analegsia prn  - arouses to voice, and intermittently FC   - CT head 9/29 no acute findings    Pulmonary   #ARDS, Acute resp failure   - tolerating PSV and passed SBT again this morning, however holding off on extubation until mental status improves with better cough.    - bronched 9/29 secretions blood tinged in trach and mainstem airways, none seen in distal airways    Cardiovascular   #septic shock, elevated trop  #HFrEF, ho htn, hld, cad  - arrived from ED on levo 26--> weaned to 2 on 9/27 however since then has had a higher pressure requirement  - MAP goals >65  - formal echo 9/26 EF @ 30%, 2020 ECHO 40-45%  - cont home regimen asa statin  - hold home regimen metop, vasotec, lasix  - SDS 9/26--> taper 50 q12h -> stopped today    Renal   #AKI (baseline Cr 2.7-3), AGMA likely ATN 2/2 sepsis and hypotension  #hx renal transplant s/t lupus nephritis, c/b rejection and renal cell CA s/p cryo ablation  - foley for accurate I/O during critical illness   - worsening renal fxn and CRRT started 9/26. UF decreased to 10 with increased pressors requirement, improved after restarting CRRT, able to increase 50 and will increase to 100 this afternoon  - continue tac with goal of 2-4   - restarted home prednisone at a higher dose 15 mg while acutely sick to be taper back down to home dose of 5 mg  - continue to hold cellcept     Infectious Disease/Autoimmune   #COVID 19 infection  Symptom onset:??9/22  Exposure: unknown  COVID PCR+: neg x 3  (home test positive 9/23?)  Day of admission/transfer: 9/25  COVID Specific??Meds:??none given   Vaccination status: full & boosted x once, evushield given 8/17  3 PCRs since admission negative and taken off precautions   ??  9/25 Bcx neg   9/25 Ucx pending, UA 3 WBC  9/25 Lower resp cx, pending, OPF  9/25 SARS-CoV-2 negative for NP and trach aspirate  9/25  CrAg negative  9/26 RPP negative  9/26 Legionella Ag (urine) positive  9/26 Legionella PCR positive  9/26 CMV PCR negative  ??  Multifocal PNA:   - started on vanco/cefepime on 9/25  - MRSA screen neg and vanco stopped 9/27, restarted 9/29 in the setting of worsening pressor requirements and GPC seen on BAL from Bronch on 9/29  - cont cefepime 9/25-->   - CT Chest 9/29      Legionella, PNA   - levofloxacin started 9/26 continue for a 7 day course  ??  Cultures:  Blood Culture, Routine (no units)   Date Value   09/11/2021 No Growth at 24 hours     Urine Culture, Comprehensive (no units)   Date Value   10-07-21 NO GROWTH     Lower Respiratory Culture (no units)   Date Value   09/11/2021 Specimen Not Processed   2021/10/07 OROPHARYNGEAL FLORA ISOLATED     WBC (10*9/L)   Date Value   09/13/2021 27.9 (H) WBC, UA (/HPF)   Date Value   2021-10-07 <1        FEN/GI   #gerd  -  TF, adv to goal as tol  - ppi for gi ppx  - bowel regimen w miralax held for loose stools    Transaminitis and Elevated bilirubin: overall all uptrending, [place] RUQ didn't demonstrated acute cholecystitis.  - continue to trend LFTs  - potential need for HIDA if continues to increase holding off for today given bilirubin downtrending     Malnutrition Assessment: Not done yet.  Body mass index is 22.68 kg/m??.        Heme/Coag     - was on heparin gtt for NSTEMI and afib- stopped 9/27  - stopped ppx with heparin in CRRT circuit   - continue SQH for DVT ppx    Endocrine   #hypoglycemic, hypothryoid  - glucose within target range yes  - cont ADI + accu checks   - hold home regimen allopurinol  - cont home regimen synthroid    Integumentary   #  - WOCN consulted for high risk skin assessment No. Reason: no alterations in skin.  - WOCN recs >> pending , agree with assmt and plan   - cont pressure mitigating precautions per skin policy    Prophylaxis/LDA/Restraints/Consults   Can CVC be removed? No: dialysis catheter   Can A-line be removed? No: >moderate dose pressors  Can Foley be removed? No: Need continuous I/O  Mobility plan: Step 1 - Range of motion    Feeding: Tube feeds at goal  Analgesia: Pain adequately controlled  Sedation SAT/SBT: Yes  Thromboembolic ppx: SQ heparin  Head of bed >30 degrees: Yes  Ulcer ppx: Yes, home use continued  Glucose within target range: Yes, in range    Does patient need/have an active type/screen? Yes    RASS at goal? No - adjusting sedation or order to reflect need  [place] Agitation Assessment Scale (RASS) : -2 (09/13/2021  8:00 AM)     Can antipsychotics be stopped? N/A, not on antipsychotics  CAM-ICU Result: Positive (09/10/2021  8:00 PM)      Would hospice care be appropriate for this patient? No, patient improving or expected to improve  Any unaddressed hospice/palliative care needs? no    Patient Lines/Drains/Airways Status     Active Active Lines, Drains, & Airways     Name Placement date Placement time Site Days    ETT  7.5 2021/10/07  1337  --  5    Hemodialysis Catheter With Distal Infusion Port 09/08/21 Right Internal jugular 09/08/21  1600  Internal jugular  4    NG/OG Tube Decompression 16 Fr. Center mouth 10-Sep-2021  1500  Center mouth  5    Urethral Catheter Temperature probe 16 Fr. 09/10/21  1602  Temperature probe  5    Peripheral IV 09/10/21 Anterior;Distal;Left;Upper Arm 09/10/21  1730  Arm  2    Arteriovenous Fistula - Vein Graft  Access 11/01/18 Right;Upper Arm 11/01/18  --  Arm  1047              Patient Lines/Drains/Airways Status     Active Wounds     Name Placement date Placement time Site Days    Wound 09/09/21 Pressure Injury Sacrum Right;Mid dark red nonblanching with dark skin surrounding DTI 09/09/21  2130  Sacrum  3                Goals of Care     Code Status: Full Code    Designated Healthcare Decision Maker:  Mr. Klem current decisional capacity for healthcare decision-making is Full capacity. His designated healthcare decision maker(s) is/are   HCDM (patient stated preference): Miguel Ward,Miguel Ward - Spouse - [phone].    Subjective   Intubated and non verbal    Objective     Vitals - past 24 hours  Temp:  [36 ??C (96.8 ??F)-36.6 ??C (97.9 ??F)] 36.6 ??C (97.9 ??F)  Heart Rate:  [82-112] 93  SpO2 Pulse:  [79-109] 92  Resp:  [17-29] 22  BP: (82-138)/(24-106) 138/106  FiO2 (%):  [35 %] 35 %  SpO2:  [89 %-99 %] 96 % Intake/Output  I/O last 3 completed shifts:  In: 2300.9 [I.V.:245.9; NG/GT:1055; IV Piggyback:1000]  Out: 1901 [Urine:30; Emesis/NG output:20; Other:1851]     Physical Exam:    General: thin elderly male, intubated, in NAD   HEENT: PERRL  CV: RRR, no m/r/g, +1 pitting BLE edema   Pulm: diminished on right, no wheezing, course crackles through out  GI: abd round, non tender  MSK: grossly intact, tone intact  Skin: grossly intact, warm  Neuro: mae spontaneously, opens eyes to voice. Following simple commands       Continuous Infusions:   ??? norepinephrine bitartrate-NS 2 mcg/min (09/13/21 0800)   ??? NxStage RFP 400 (+/- BB) 5000 mL - contains 2 mEq/L of potassium     ??? NxStage/multiBic RFP 401 (+/- BB) 5000 mL - contains 4 mEq/L of potassium         Scheduled Medications:   ??? acetaminophen  1,000 mg Enteral tube: gastric  Q8H   ??? aspirin  81 mg Enteral tube: gastric  Daily   ??? atorvastatin  20 mg Enteral tube: gastric  Nightly   ??? calcitRIOL  0.25 mcg Enteral tube: gastric  Q MWF   ??? Cefepime  1 g Intravenous [facility]   ??? chlorhexidine  5 mL Mouth BID   ??? esomeprazole  40 mg Enteral tube: gastric  daily   ??? heparin (porcine)  5,000 Units Subcutaneous [facility]   ??? flu vacc qs2022-23 6mos up(PF)  0.5 mL Intramuscular During hospitalization   ??? levoFLOXacin  500 mg Intravenous Q24H   ??? levothyroxine  250 mcg Enteral tube: gastric  daily   ??? magnesium oxide  400 mg Enteral tube: gastric  BID   ??? [START ON 09/14/2021] predniSONE  15 mg Oral Daily   ??? Tacrolimus  1 mg Enteral tube: gastric  BID   ???  vancomycin  1,000 mg Intravenous Q24H   ??? white petrolatum-mineral oil  1 application Both Eyes BID       PRN medications:  dextrose in water, heparin (porcine), heparin (porcine), ondansetron    Data/Imaging Review: Reviewed in Epic and personally interpreted on 09/13/2021. See EMR for detailed results.       Critical Care Attestation     This patient is critically ill or injured with the impairment of vital organ systems such that there is a high probability of imminent or life threatening deterioration in the patient's condition. This patient must remain in the ICU for ongoing evaluation of the comprehensive management plan outlined in this note. I directly provided critical care services as documented in this note and the critical care time spent (50  min) is exclusive of separately billable procedures.  In addition to time spent for critical care management, I also provided advance care planning services for 0  minutes (see ACP note for details)???. Total billable critical care time 50  minutes.    Toma Aran, PA

## 2021-09-13 NOTE — Unmapped (Signed)
Pt remained on vent overnight on PSV 10/+5 35%. ETT secure and rotated. Small, tan, thick secretions suctioned throughout shift.

## 2021-09-13 NOTE — Unmapped (Signed)
Immunocompromised Host ID Attending Addendum  I saw and evaluated the patient. I discussed and agree with the findings and the plan of care as documented in the fellow???s note.The patient is at risk of decompensation from infection due to underlying immunocompromise and/or mucosal barrier defects and/or presence of medical devices.   I personally reviewed updated microbiological culture and susceptibility data.  I personally reviewed updated relevant radiological studies.    Drue Second  Immunocompromised Host Infectious Diseases  Pager [phone]     IMMUNOCOMPROMISED HOST INFECTIOUS DISEASE PROGRESS NOTE    Assessment/Plan:     Miguel Ward is a 70 y.o. 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 COVID tests negative x 3 here. Imaging and severity suggested alternative infection vs. superinfection, and thus patient started on broad-spectrum coverage with vancomycin/cefepime -> cefepime with some improvement. Found to have legionellosis (Ag and PCR) and started on levofloxacin as well. Respiratory status has improved, however progress in hemodynamic and mental status have somewhat stagnated bringing up concern for other untreated etiology. CT chest with significant consolidations throughout R lung - appreciate team assistance with bronchoscopy and will await results of BAL studies 09/11/21. In meantime will continue therapy with vancomycin/cefepime/levofloxacin.    ID Problem List:  ESRD 2/2 Lupus nephritis s/p deceased donor kidney transplant 05/2007  - Surgical complications: None  - Serologies: CMV D+/R-  - Immunosuppression: TAC 3/2, MMF 250, pred 5  - Prophylaxis prior to admission: acyclovir (hx HSV)  - Rejection history: Early cellular s/p ATG 2008        Pertinent Co-morbidities  SLE with lupus nephritis  CKDIV  RCC R native kidney s/p nephrectomy 02/2014  Suspected RCC transplanted kidney 12/2020, sp percutaneous cryoablation 06/2021  HFmREF (40-45% 06/2019)  CAD  HTN        Pertinent Exposure History  Occasionally mows lawn and works on car in garage, no insect/tick exposures  No animal exposures  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 Legionella Ag positive, pt also at risk for other bacterial infection or other pathogens (fungi, PJP, CMV)  - 07-Oct-2021 found at home minimally responsive, brought to ED, intubated  - 10-07-21 NP and tracheal specimens negative for SARS-CoV2  - 09/08/21 urine legionella antigen positive. PCR of tracheal aspirate positive  - 09/11/21 CT chest extensive mass-like consolidations with air bronchograms throughout R lung, some in LUL as well  - 09/11/21 Increasing WBC, LFTs (particularly ALP and bilirubin)  - 09/11/21 Bronch/BAL: Negative mycoplasma, chlamydiophila, PJP, EBV, CMV, RPP. Cx with OPF for now.  Tx: Vancomycin/cefepime/remdesivir 9/25 -> added levofloxacin 9/26 -> cefepime/levofloxacin 9/27 -> Vanc/CFP/levofloxacin 9/29    #Increasing ALP and ALT/AST  - 10/07/21 admission with mildly elevated Tbili/ALP and AST (though with elevated CK)  - 09/10/21 RUQ [place] no abnormalities, no doppler done  - 09/13/21 ALP increasing steadily, now 628. Tbili stabilized, AST/ALT increasing as well  - Along with WBC increase, somewhat concerning  for drug reaction. Minimal suspicion CFP, low c/f vancomycin, not impossible but rare with levofloxacin    #COVID antigen positive at home 09/05/21  Three negative PCRs while inpatient. Unclear if implies false positive, perhaps resolving infection from weeks prior  - No remdesivir received     Prior infections:  #Fever and leukocytosis, possible UTI 08/2016  #Liver lesions suspected to be abscesses  - Suspected UTI, improved on empiric broad spectrum therapy Vanc/CFP -> doxy  - CT new liver lesions, FDG avid on PET, concerning for metastases  - Liver biopsy 09/2016 nonspecific inflammatory changes suggesting resolving abscess  - MRI 01/2017 improved liver lesions     #RSV infection 02/2018  - Also diagnosed with decompensated diastolic and systolic HF     Antimicrobial Intolerance/allergy  Penicillins - hives (2014), tolerated cephalosporins       RECOMMENDATIONS    Diagnostic  Please f/u: histo Ag  Repeat Hepatitis B serology - had positive core Ab in 2003  Consider adding on GGT  Follow up Bcx 9/29  Follow up BAL cx: aerobic/anaerobic, fungal, AFB, Asp Ag, actinomyces  CTM LFTs    Lab monitoring for antimicrobial toxicities  CBC w/ diff and CMP at least weekly  Please repeat EKG after starting quinolone therapy    Treatment  Multifocal pneumonia, Legionella antigen positive  Continue Levofloxacin, CRRT dosed equivalent 500 mg daily  Duration minimum 10 days, up to 14 days (through at least 09/17/21)  Continue vancomycin and cefepime for now, favor discontinuing if BAL cx negative tomorrow 09/14/21    Mixed cholestatic/hepatocellular liver injury  Consider holding other potentially hepatotoxic medications, such as statin  If continued worsening of WBC and LFTs, may need to consider alternative therapy to quinolone      Immunosuppression and prophylaxis  IS deferred to transplant team, prefer to continue tacrolimus if SARS-2 positive  Cont ppx with acyclovir           The [facility] ID service will continue to follow.  Please page the ID Transplant/Liquid Oncology Fellow consult at [phone] with questions.  Patient discussed with Dr. Julaine Hua.    Subjective:     Interval History:    History obtained from:patient and nurse.  Events since last encounter: NAEO. Bronch yesterday  Today's HPI for primary problems: Pressors down again but turned back up. Vent settings stable. No fevers. Mental status seems to be improving again.      Medications:  Antimicrobials:  Anti-infectives (From admission, onward)      Start     Dose/Rate Route Frequency Ordered Stop    09/12/21 1900  vancomycin (VANCOCIN) IVPB 1000 mg (premix)         1,000 mg  over 60 Minutes Intravenous Every 24 hours 09/11/21 1904 09/19/21 1759    09/09/21 1500  levoFLOXacin (LEVAQUIN) 500 mg/100 mL IVPB 500 mg        Followed by Linked Group Details    500 mg  100 mL/hr over 60 Minutes Intravenous Every 24 hours 09/08/21 1402 09/15/21 1459    09/08/21 2200  cefepime (MAXIPIME) 1 g in sodium chloride 0.9 % (NS) 100 mL IVPB-connector bag         1 g  200 mL/hr over 30 Minutes Intravenous Every 8 hours scheduled 09/08/21 1501 09/15/21 2159            Prior/Current immunomodulators:  Tacrolimus 3/2  MMF 250 bid  Prednisone 5    Other medications reviewed.    Objective:  Vital Signs last 24 hours:  Temp:  [36 ??C (96.8 ??F)-36.6 ??C (97.9 ??F)] 36.6 ??C (97.9 ??F)  Heart Rate:  [76-112] 81  SpO2 Pulse:  [76-109] 85  Resp:  [16-29] 20  BP: (85-138)/(24-106) 117/49  MAP (mmHg):  [43-116] 75  FiO2 (%):  [35 %] 35 %  SpO2:  [89 %-99 %] 98 %    Physical Exam:   Patient Lines/Drains/Airways Status       Active Active Lines, Drains, & Airways       Name Placement date Placement time Site Days    ETT  7.5 Sep 17, 2021  1337  -- 6    Hemodialysis Catheter With Distal Infusion Port 09/08/21 Right Internal jugular 09/08/21  1600  Internal jugular  4    NG/OG Tube Decompression 16 Fr. Center mouth Sep 17, 2021  1500  Center mouth  6    Urethral Catheter Temperature probe 16 Fr. 09/17/2021  1602  Temperature probe  5    Arteriovenous Fistula - Vein Graft  Access 11/01/18 Right;Upper Arm 11/01/18  --  Arm  1047 Const [x]  vital signs above    []  NAD, non-toxic appearance        Eyes []  Lids normal bilaterally, conjunctiva anicteric and noninjected OU     [] PERRL  [] EOMI  Swollen lids and conjunctiva. Pupils equal, reactive to light      ENMT [x]  Normal appearance of external nose and ears, no nasal discharge        []  MMM, no lesions on lips or gums []  No thrush, leukoplakia, oral lesions  []  Dentition good []  Edentulous []  Dental caries present  []  Hearing normal  []  TMs with good light reflexes bilaterally   ETT in place      Neck [x]  Neck of normal appearance and trachea midline        []  No thyromegaly, nodules, or tenderness   []  Full neck ROM  RIJ trialysis catheter in place, no bleeding, erythema      Lymph [x]  No LAD in neck     []  No LAD in supraclavicular area     []  No LAD in axillae   []  No LAD in epitrochlear chains     []  No LAD in inguinal areas        CV [x]  RRR            [x]  No peripheral edema     []  Pedal pulses intact   []  No abnormal heart sounds appreciated   []  Extremities WWP         Resp [x]  Normal WOB at rest    []  No breathlessness with speaking, no coughing  []  CTA anteriorly    []  CTA bilaterally    Vent sounds anteriorly      GI []  Normal inspection, NTND   []  NABS     []  No umbilical hernia on exam       []  No hepatosplenomegaly     []  Inspection of perineal and perianal areas normal  Transplanted kidney palpable, no grimacing      GU []  Normal external genitalia     [] No urinary catheter present in urethra   []  No CVA tenderness    []  No tenderness over renal allograft  R fem cath removed, dressing in place. Sacral wound not visualized      MSK []  No clubbing or cyanosis of hands       []  No vertebral point tenderness  []  No focal tenderness or  abnormalities on palpation of joints in RUE, LUE, RLE, or LLE        Skin []  No rashes, lesions, or ulcers of visualized skin     []  Skin warm and dry to palpation   RUE with AVG with thrill, dilated up to shoulder. LUE with graft without thrill           Neuro _ Face expression symmetric  []  Sensation to light touch grossly intact throughout    []  Moves extremities equally    []  No tremor noted        []  CNs II-XII grossly intact     []  DTRs normal and symmetric throughout []  Gait unremarkable  Opens eyes to voice, following simple commands, shakes head appropriately to questions      Psych []  Appropriate affect       []  Fluent speech         []  Attentive, good eye contact  []  Oriented to person, place, time          []  Judgment and insight are appropriate   Sedated          Data for Medical Decision Making     Recent Labs   Lab Units 09/13/21  1147 09/13/21  0323 09/11/21  0805 09/11/21  0430 09/08/21  0914 09/08/21  0534   WBC 10*9/L 31.4* 27.9*   < > 19.9*   < >  --    HEMOGLOBIN g/dL 8.5* 8.7*   < > 8.8*   < >  --    HEMOGLOBIN BG   --   --   --   --    < >  --    PLATELET COUNT (1) 10*9/L 93* 79*   < > 72*   < >  --    NEUTRO ABS 10*9/L  --  26.8*   < > 19.4*   < >  --    LYMPHO ABS 10*9/L  --  0.5*   < > 0.2*   < >  --    EOSINO ABS 10*9/L  --  0.0   < > 0.0   < >  --    BUN mg/dL 35* 31*   < > 33*   < > 90*   CREATININE mg/dL 7.82* 9.56*   < > 2.13*   < > 5.40*   AST U/L  --  269*   < > 179*   < > 90*   ALT U/L  --  74*   < > 40   < > 21   BILIRUBIN TOTAL mg/dL  --  1.6*   < > 2.4*   < > 1.9*   ALK PHOS U/L  --  628*   < > 305*   < > 87   POTASSIUM WHOLE BLOOD   --   --   --   --    < >  --    POTASSIUM mmol/L 5.0* 4.9*   < > 4.8   < > 5.2*   MAGNESIUM mg/dL 1.9 1.9   < > 2.0   < >  --    PHOSPHORUS mg/dL 2.2* 2.4   < > 2.0*   < >  --    CALCIUM mg/dL 8.7 8.5*   < > 8.5*   < > 8.8   CK TOTAL U/L  --   --   --  488.0*   < > 1,775.0*   CRP mg/L  --   --   --   --   --  317.0*    < > = values in this interval not displayed.     I reviewed and noted the following labs: WBC 31.4, Hgb8.5, Plt 93, Cr 1.45, ALP 628 (increasing)  Microbiology:    9/25 Bcx pending  9/25 Ucx pending, UA 3 WBC  9/25 Lower resp cx, pending, OPF  9/25 SARS-CoV-2 negative for NP and trach aspirate  9/25 CrAg negative  9/26 RPP negative  9/26 Legionella Ag (urine) positive  9/26 Legionella PCR positive  9/26 CMV PCR negative  9/26 Fungitell negative  9/29 BAL cx OPF so far  9/29 BAL AFB smear negative, cx pending  9/29 PJP smear negative    Past cultures were reviewed in Epic and CareEverywhere  Review of cultures with microbiology: I reviewed the specimens with the microbiology team and agree with their assessment.    Imaging:       Independent visualization of images: I independently reviewed the image from 09/11/21 and I agree with the findings/interpretation.    Additional Studies:   (09/08/21) EKG [id number]

## 2021-09-13 NOTE — Unmapped (Signed)
VENOUS ACCESS TEAM RECOMMENDATION     Indication:  Poor Venous Access   Venous Access Team have assessed this patient  for the placement of a PIV. Currently, the patient has insufficient vasculature to support the insertion or maintenance of 2 PIV???s. At this time we suggest  a temporary line.  Pt has a A-V fistula in RUE and old A-V fistula LUE,current use HD cath for access.    Workup / Procedure Time:  30 minutes    RN was notified at bedside.Thank you,       Lorelle Formosa RN Venous Access Team [phone]

## 2021-09-13 NOTE — Unmapped (Signed)
Respiratory Safety Check    Date: 09/13/2021     PPV Device at bedside:   [x]  Yes  []  Replaced  []  N/A    PPV mask at bedside:  [x]  Yes  []  Replaced  []  N/A    Checked humidifier, inspiratory limb, and expiratory limb:   [x]  Yes  []  Replaced  []  N/A    Backup trachs and supplies available at bedside:  []  Yes  []  Replaced  [x]  N/A    Documented trach airway form visible at bedside:   []  Yes  []  Replaced  [x]  N/A    iNO connected with PPV device:  []  Yes  []  Replaced  [x]  N/A

## 2021-09-13 NOTE — Unmapped (Signed)
Pt remains RASS -2 to -3; Tolerating current ventilator settings, required frequent suctioning due to increased secretions. TF increased per goal. No urine output to foley cath, CRRT runnings without incident, UF increased to 146ml/hr by end of shift.     Updates given to spouse via telephone, multiple friends and family memebrs came to bedside to visit throughout the day.       Problem: Fall Injury Risk  Goal: Absence of Fall and Fall-Related Injury  Outcome: Ongoing - Unchanged  Intervention: Promote Injury-Free Environment  Recent Flowsheet Documentation  Taken 09/12/2021 0800 by Maryruth Eve, RN  Safety Interventions: aspiration precautions     Problem: Infection  Goal: Absence of Infection Signs and Symptoms  Outcome: Ongoing - Unchanged  Intervention: Prevent or Manage Infection  Recent Flowsheet Documentation  Taken 09/12/2021 0800 by Maryruth Eve, RN  Infection Management: aseptic technique maintained     Problem: Adult Inpatient Plan of Care  Goal: Plan of Care Review  Outcome: Ongoing - Unchanged  Goal: Patient-Specific Goal (Individualized)  Outcome: Ongoing - Unchanged  Goal: Absence of Hospital-Acquired Illness or Injury  Outcome: Ongoing - Unchanged  Intervention: Identify and Manage Fall Risk  Recent Flowsheet Documentation  Taken 09/12/2021 0800 by Maryruth Eve, RN  Safety Interventions: aspiration precautions  Intervention: Prevent Skin Injury  Recent Flowsheet Documentation  Taken 09/12/2021 0800 by Maryruth Eve, RN  Skin Protection:   cleansing with dimethicone incontinence wipes   adhesive use limited  Intervention: Prevent and Manage VTE (Venous Thromboembolism) Risk  Recent Flowsheet Documentation  Taken 09/12/2021 0800 by Maryruth Eve, RN  Activity Management: bedrest  Intervention: Prevent Infection  Recent Flowsheet Documentation  Taken 09/12/2021 0800 by Maryruth Eve, RN  Infection Prevention: cohorting utilized  Goal: Optimal Comfort and Wellbeing  Outcome: Ongoing - Unchanged  Goal: Readiness for Transition of Care  Outcome: Ongoing - Unchanged  Goal: Rounds/Family Conference  Outcome: Ongoing - Unchanged     Problem: Skin Injury Risk Increased  Goal: Skin Health and Integrity  Outcome: Ongoing - Unchanged  Intervention: Optimize Skin Protection  Recent Flowsheet Documentation  Taken 09/12/2021 0800 by Maryruth Eve, RN  Pressure Reduction Techniques: weight shift assistance provided  Head of Bed (HOB) Positioning: HOB at 30-45 degrees  Pressure Reduction Devices:   specialty bed utilized   pressure-redistributing mattress utilized  Skin Protection:   cleansing with dimethicone incontinence wipes   adhesive use limited     Problem: Dysrhythmia (Heart Failure)  Goal: Stable Heart Rate and Rhythm  Outcome: Ongoing - Unchanged     Problem: Fluid Imbalance (Heart Failure)  Goal: Fluid Balance  Outcome: Ongoing - Unchanged     Problem: Respiratory Compromise (Heart Failure)  Goal: Effective Oxygenation and Ventilation  Outcome: Ongoing - Unchanged     Problem: Device-Related Complication Risk (Mechanical Ventilation, Invasive)  Goal: Optimal Device Function  Outcome: Ongoing - Unchanged  Intervention: Optimize Device Care and Function  Recent Flowsheet Documentation  Taken 09/12/2021 0800 by Maryruth Eve, RN  Aspiration Precautions: upright posture maintained     Problem: Nutrition Impairment (Mechanical Ventilation, Invasive)  Goal: Optimal Nutrition Delivery  Outcome: Ongoing - Unchanged     Problem: Non-Violent Restraints  Goal: Patient will remain free of restraint events  Outcome: Ongoing - Unchanged  Goal: Patient will remain free of physical injury  Outcome: Ongoing - Unchanged     Problem: Impaired Wound Healing  Goal: Optimal Wound Healing  Outcome: Ongoing - Unchanged  Intervention: Promote  Wound Healing  Recent Flowsheet Documentation  Taken 09/12/2021 0800 by Maryruth Eve, RN  Activity Management: bedrest     Problem: Self-Care Deficit  Goal: Improved Ability to Complete Activities of Daily Living  Outcome: Ongoing - Unchanged     Problem: Diabetes Comorbidity  Goal: Blood Glucose Level Within Targeted Range  Outcome: Ongoing - Unchanged     Problem: Heart Failure Comorbidity  Goal: Maintenance of Heart Failure Symptom Control  Outcome: Ongoing - Unchanged     Problem: Hypertension Comorbidity  Goal: Blood Pressure in Desired Range  Outcome: Ongoing - Unchanged

## 2021-09-13 NOTE — Unmapped (Signed)
IMMUNOCOMPROMISED HOST INFECTIOUS DISEASE PROGRESS NOTE    Assessment/Plan:     Miguel Ward is a 70 y.o. 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 COVID tests negative x 3 here. Imaging and severity suggested alternative infection vs. superinfection, and thus patient started on broad-spectrum coverage with vancomycin/cefepime -> cefepime with some improvement. Found to have legionellosis (Ag and PCR) and started on levofloxacin as well. Respiratory status has improved, however progress in hemodynamic and mental status have somewhat stagnated bringing up concern for other untreated etiology. CT chest with significant consolidations throughout R lung with abscesses - appreciate team assistance with bronchoscopy and will await results of BAL studies 09/11/21. In meantime will continue therapy with vancomycin/cefepime/levofloxacin.    ID Problem List:  ESRD 2/2 Lupus nephritis s/p deceased donor kidney transplant 05/2007  - Surgical complications: None  - Serologies: CMV D+/R-  - Immunosuppression: TAC 3/2, MMF 250, pred 5  - Prophylaxis prior to admission: acyclovir (hx HSV)  - Rejection history: Early cellular s/p ATG 2008        Pertinent Co-morbidities  SLE with lupus nephritis  CKDIV  RCC R native kidney s/p nephrectomy 02/2014  Suspected RCC transplanted kidney 12/2020, sp percutaneous cryoablation 06/2021  HFmREF (40-45% 06/2019)  CAD  HTN        Pertinent Exposure History  Occasionally mows lawn and works on car in garage, no insect/tick exposures  No animal exposures  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 Legionella Ag positive, pt also at risk for other bacterial infection or other pathogens (fungi, PJP, CMV)  - 2021-10-06 found at home minimally responsive, brought to ED, intubated  - Oct 06, 2021 NP and tracheal specimens negative for SARS-CoV2  - 09/08/21 urine legionella antigen positive. PCR of tracheal aspirate positive  - 09/11/21 CT chest extensive mass-like consolidations with air bronchograms throughout R lung, some in LUL as well  - 09/11/21 Increasing WBC, LFTs (particularly ALP and bilirubin)  - 09/11/21 Bronch/BAL: Negative mycoplasma, chlamydiophila, PJP, EBV, CMV, RPP. Cx with 1+ GPC likely OPF per micro.  Tx: Vancomycin/cefepime/remdesivir 9/25 -> added levofloxacin 9/26 -> cefepime/levofloxacin 9/27 -> Vanc/CFP/levofloxacin 9/29    #COVID antigen positive at home 09/05/21  Three negative PCRs while inpatient. Unclear if implies false positive, perhaps resolving infection from weeks prior  - No remdesivir received     Prior infections:  #Fever and leukocytosis, possible UTI 08/2016  #Liver lesions suspected to be abscesses  - Suspected UTI, improved on empiric broad spectrum therapy Vanc/CFP -> doxy  - CT new liver lesions, FDG avid on PET, concerning for metastases  - Liver biopsy 09/2016 nonspecific inflammatory changes suggesting resolving abscess  - MRI 01/2017 improved liver lesions     #RSV infection 02/2018  - Also diagnosed with decompensated diastolic and systolic HF     Antimicrobial Intolerance/allergy  Penicillins - hives (2014), tolerated cephalosporins       RECOMMENDATIONS  Diagnostic  ?? Please f/u: histo Ag  ?? Repeat Bcx x 2  ?? Follow up Bcx 9/25  ?? Follow up BAL cx: aerobic/anaerobic, fungal, AFB, Asp Ag, actinomyces  ?? If able, please obtain bronchoscopy and send for following:  ?? If ALP and Tbili continue to increase, may consider GI evaluation, vs. Further imaging ([place] with doppler, MRCP, etc.)    Lab monitoring for antimicrobial toxicities  ?? CBC w/ diff and CMP at least weekly  ?? Please repeat EKG after starting quinolone therapy    Treatment  Multifocal pneumonia, Legionella antigen positive  ?? Continue cefepime renally dosed equivalent 2g q8h  ?? Continue Levofloxacin, CRRT dosed equivalent 500 mg daily  ?? Duration minimum 10 days, up to 14 days (through at least 09/17/21)  ?? Continue vancomycin for now, pending repeat BAL cx     Immunosuppression and prophylaxis  ?? IS deferred to transplant team, prefer to continue tacrolimus if SARS-2 positive  ?? Cont ppx with acyclovir           The [facility] ID service will continue to follow.  Please page the ID Transplant/Liquid Oncology Fellow consult at [phone] with questions.  Patient discussed with Dr. Julaine Hua.    Subjective:     Interval History:    History obtained from:patient and nurse.  Events since last encounter: NAEO. Bronch yesterday  Today's HPI for primary problems: Pressors down again but turned back up. Vent settings stable. No fevers. Mental status seems to be improving again.      Medications:  Antimicrobials:  Anti-infectives (From admission, onward)    Start     Dose/Rate Route Frequency Ordered Stop    09/12/21 1900  vancomycin (VANCOCIN) IVPB 1000 mg (premix)         1,000 mg  over 60 Minutes Intravenous Every 24 hours 09/11/21 1904 09/19/21 1859    09/09/21 1500  levoFLOXacin (LEVAQUIN) 500 mg/100 mL IVPB 500 mg        Followed by Linked Group Details    500 mg  100 mL/hr over 60 Minutes Intravenous Every 24 hours 09/08/21 1402 09/15/21 1459    09/08/21 2200  cefepime (MAXIPIME) 1 g in sodium chloride 0.9 % (NS) 100 mL IVPB-connector bag         1 g  200 mL/hr over 30 Minutes Intravenous Every 8 hours scheduled 09/08/21 1501 09/15/21 2159          Prior/Current immunomodulators:  Tacrolimus 3/2  MMF 250 bid  Prednisone 5    Other medications reviewed.    Objective:     Vital Signs last 24 hours:  Temp:  [36 ??C (96.8 ??F)] 36 ??C (96.8 ??F)  Core Temp:  [35.8 ??C (96.4 ??F)-37.4 ??C (99.3 ??F)] 35.9 ??C (96.6 ??F)  Heart Rate:  [85-129] 99  SpO2 Pulse:  [79-128] 104  Resp:  [15-33] 25  BP: (74-159)/(42-82) 129/62  MAP (mmHg):  [52-96] 77  FiO2 (%):  [35 %-100 %] 35 %  SpO2:  [89 %-100 %] 91 %    Physical Exam:   Patient Lines/Drains/Airways Status     Active Active Lines, Drains, & Airways     Name Placement date Placement time Site Days    ETT  7.5 09-30-21  1337  -- 5    Hemodialysis Catheter With Distal Infusion Port 09/08/21 Right Internal jugular 09/08/21  1600  Internal jugular  4    NG/OG Tube Decompression 16 Fr. Center mouth 09-30-2021  1500  Center mouth  5  Urethral Catheter Temperature probe 16 Fr. 08/17/2021  1602  Temperature probe  5    Peripheral IV 09/10/21 Anterior;Distal;Left;Upper Arm 09/10/21  1730  Arm  1    Arteriovenous Fistula - Vein Graft  Access 11/01/18 Right;Upper Arm 11/01/18  --  Arm  1046              Const [x]  vital signs above    []  NAD, non-toxic appearance        Eyes []  Lids normal bilaterally, conjunctiva anicteric and noninjected OU     [] PERRL  [] EOMI  Swollen lids and conjunctiva. Pupils equal, reactive to light      ENMT [x]  Normal appearance of external nose and ears, no nasal discharge        [x]  MMM, no lesions on lips or gums []  No thrush, leukoplakia, oral lesions  []  Dentition good []  Edentulous []  Dental caries present  []  Hearing normal  []  TMs with good light reflexes bilaterally         Neck [x]  Neck of normal appearance and trachea midline        []  No thyromegaly, nodules, or tenderness   []  Full neck ROM  RIJ trialysis catheter in place      Lymph [x]  No LAD in neck     []  No LAD in supraclavicular area     []  No LAD in axillae   []  No LAD in epitrochlear chains     []  No LAD in inguinal areas        CV [x]  RRR            [x]  No peripheral edema     []  Pedal pulses intact   []  No abnormal heart sounds appreciated   []  Extremities WWP         Resp [x]  Normal WOB at rest    []  No breathlessness with speaking, no coughing  []  CTA anteriorly    []  CTA bilaterally    Vent sounds anteriorly      GI []  Normal inspection, NTND   []  NABS     []  No umbilical hernia on exam       []  No hepatosplenomegaly     []  Inspection of perineal and perianal areas normal  Transplanted kidney palpable      GU []  Normal external genitalia     [] No urinary catheter present in urethra   []  No CVA tenderness    []  No tenderness over renal allograft  R fem cath removed, dressing in place. Sacral wound not visualized      MSK []  No clubbing or cyanosis of hands       []  No vertebral point tenderness  []  No focal tenderness or abnormalities on palpation of joints in RUE, LUE, RLE, or LLE        Skin []  No rashes, lesions, or ulcers of visualized skin     []  Skin warm and dry to palpation   RUE with AVG with thrill, dilated up to shoulder. LUE with graft without thrill           Neuro _ Face expression symmetric  []  Sensation to light touch grossly intact throughout    []  Moves extremities equally    []  No tremor noted        []  CNs II-XII grossly intact     []  DTRs normal and symmetric throughout []  Gait unremarkable  Opens eyes to voice, following simple commands, attempting to speak  Psych []  Appropriate affect       []  Fluent speech         []  Attentive, good eye contact  []  Oriented to person, place, time          []  Judgment and insight are appropriate   Sedated          Data for Medical Decision Making     Recent Labs   Lab Units 09/12/21  1233 09/12/21  0312 09/11/21  0805 09/11/21  0430 09/08/21  0914 09/08/21  0534   WBC 10*9/L 22.2* 20.8*   < > 19.9*   < >  --    HEMOGLOBIN g/dL 8.4* 8.5*   < > 8.8*   < >  --    HEMOGLOBIN BG   --   --   --   --    < >  --    PLATELET COUNT (1) 10*9/L 74* 70*   < > 72*   < >  --    NEUTRO ABS 10*9/L  --  19.9*  --  19.4* < >  --    LYMPHO ABS 10*9/L  --  0.4*  --  0.2*   < >  --    EOSINO ABS 10*9/L  --  0.0  --  0.0   < >  --    BUN mg/dL 32* 32*   < > 33*   < > 90*   CREATININE mg/dL 1.61* 0.96*   < > 0.45*   < > 5.40*   AST U/L  --  191*  --  179*   < > 90*   ALT U/L  --  46  --  40   < > 21   BILIRUBIN TOTAL mg/dL  --  1.9*  --  2.4*   < > 1.9*   ALK PHOS U/L  --  404*  --  305*   < > 87   POTASSIUM WHOLE BLOOD   --   --   --   --    < >  --    POTASSIUM mmol/L 4.8 4.9*   < > 4.8   < > 5.2*   MAGNESIUM mg/dL 1.8 1.9   < > 2.0   < >  --    PHOSPHORUS mg/dL 3.0 3.1   < > 2.0*   < >  --    CALCIUM mg/dL 8.8 8.6*   < > 8.5*   < > 8.8   CK TOTAL U/L  --   --   --  488.0*   < > 1,775.0*   CRP mg/L  --   --   --   --   --  317.0*    < > = values in this interval not displayed.     I reviewed and noted the following labs: WBC20.8, Hgb 8.5, Plt 70, Cr 1.72, ALP 404    Microbiology:    9/25 Bcx pending  9/25 Ucx pending, UA 3 WBC  9/25 Lower resp cx, pending, OPF  9/25 SARS-CoV-2 negative for NP and trach aspirate  9/25 CrAg negative  9/26 RPP negative  9/26 Legionella Ag (urine) positive  9/26 Legionella PCR positive  9/26 CMV PCR negative  9/26 Fungitell negative  9/29 BAL cx 1+ GPCs (likely OPF)  9/29 BAL AFB smear negative, cx pending  9/29 PJP smear negative    Past cultures were reviewed in Epic and CareEverywhere  Review of cultures with microbiology: I reviewed the  specimens with the microbiology team and agree with their assessment.    Imaging:    Independent visualization of images: I independently reviewed the image from (date) and I agree with the findings/interpretation.    Additional Studies:   (09/08/21) EKG [id number]

## 2021-09-13 NOTE — Unmapped (Signed)
[facility] Nightshift Note     Date of Service: 09/13/2021    Principal Problem:    Acute respiratory distress syndrome (ARDS) due to COVID-19 virus (CMS-HCC)  Active Problems:    Hypercholesterolemia    Hypertension    History of kidney transplant    Renal cell carcinoma (CMS-HCC)    Immunosuppression-related infectious disease (CMS-HCC)    Bilateral lower extremity edema    Elevated troponin I level    GERD (gastroesophageal reflux disease)    Diastolic heart failure (CMS-HCC)    Acute on chronic HFrEF (heart failure with reduced ejection fraction) (CMS-HCC)    COVID    ATN (acute tubular necrosis) (CMS-HCC)    Acute renal failure with tubular necrosis (CMS-HCC)  Resolved Problems:    * No resolved hospital problems. *          Cross cover summary       CRRT up to 100/hr.   NE nearly weaned off.      Forestine Chute, MD

## 2021-09-13 NOTE — Unmapped (Signed)
Continuous Renal Replacement  Dialysis Nurse Therapy Procedure Note    Treatment Type:  [facility] Number Of Days On Therapy:   Procedure Date:  09/13/2021 9:10 AM     TREATMENT STATUS:  Restarted  Patient and Treatment Status     None          Active Dialysis Orders (168h ago, onward)     Start     Ordered    09/12/21 1105  CRRT Orders - NxStage (Adult)  Continuous        Comments: Fluid Removal Rate parameters:  MAP   < 50 mmHg 10 mL/hr;  MAP 51-60 mmHg 50 mL/hr;  MAP 61-70 mmHg 75 mL/hr;  MAP   > 70 mmHg 100 mL/hr   Question Answer Comment   CRRT System: NxStage    Modality: CVVH    Access: Right Internal Jugular    BFR (mL/min): 200-350    Dialysate Flow Rate (mL/kg/hr): Other (Specify) 1.7   Fluid Removal Initial Rate (mL/hr): 10    Fluid Removal Parameters: See below    Connect to Ecmo No        09/12/21 1110              SYSTEM CHECK:  Machine Name: [id number]  Dialyzer: [id number]   Self Test Completed: Yes.        Alarms Connected To The Wall And Active:  n/a    VITAL SIGNS:  Temp:  [36 ??C (96.8 ??F)-36.6 ??C (97.9 ??F)] 36.6 ??C (97.9 ??F)  Core Temp:  [35.9 ??C (96.6 ??F)-36 ??C (96.8 ??F)] 35.9 ??C (96.6 ??F)  Heart Rate:  [82-112] 99  SpO2 Pulse:  [79-109] 90  Resp:  [17-29] 23  SpO2:  [89 %-99 %] 99 %  BP: (82-134)/(24-88) 124/56  MAP (mmHg):  [43-94] 68    ACCESS SITE:        Hemodialysis Catheter With Distal Infusion Port 09/08/21 Right Internal jugular (Active)   Site Assessment Bleeding 09/13/21 0849   Proximal Lumen Status / Patency Blood Return - Brisk 09/13/21 0849   Proximal Lumen Intervention Accessed 09/13/21 0849   Medial Lumen Status / Patency Blood Return - Brisk 09/13/21 0849   Medial Lumen Intervention Accessed 09/13/21 0849   Lumen 3, Distal Status / Patency Infusing 09/13/21 0400   Distal Lumen Intervention Accessed 09/13/21 0400   IV Tubing and Needleless Injector Cap Change Due 09/17/21 09/13/21 0400   Dressing Type CHG gel 09/13/21 0400   Dressing Status      Changed 09/13/21 0849   Dressing Intervention New dressing 09/13/21 0849   Contraindicated due to: Dressing Intact surrounding insertion site 09/12/21 2000   Dressing Change Due 09/20/21 09/13/21 0849   Line Necessity Reviewed? Y 09/13/21 0849   Line Necessity Indications Yes - Hemodialysis 09/13/21 0849   Line Necessity Reviewed With med i 09/12/21 2000     Arteriovenous Fistula - Vein Graft  Access 11/01/18 Right;Upper Arm (Active)   Site Assessment Clean;Dry 09/13/21 0400   AV Fistula Thrill Present;Thrill Absent 09/13/21 0400   Status Deaccessed 09/13/21 0400   Dressing Intervention No intervention needed 09/12/21 1200   Dressing Status      Clean;Dry 09/12/21 2000   Site Condition No complications 09/13/21 0400   Dressing Open to air (None) 09/13/21 0400       CATHETER FILL VOLUMES:     Arterial: 1.4 mL  Venous: 1.4 mL     Lab Results   Component Value Date    NA  135 09/13/2021    K 4.9 (H) 09/13/2021    CL 102 09/13/2021    CO2 26.0 09/13/2021    BUN 31 (H) 09/13/2021     Lab Results   Component Value Date    CALCIUM 8.5 (L) 09/13/2021    CAION 4.55 09/10/2021    PHOS 2.4 09/13/2021    MG 1.9 09/13/2021        SETTINGS:  Blood Pump Rate: 350 mL/min  Replacement Fluid Rate:     Pre-Blood Pump Fluid Rate:    Hourly Fluid Removal Rate: 50 mL/hr   Dialysate Fluid Rate  1.7L/hr  Therapy Fluid Temperature:  36     ANTICOAGULANT:  None    ADDITIONAL COMMENTS:  None    HEMODIALYSIS ON-CALL NURSE PAGER NUMBER:  ?? Monday thru Saturday 0700 - 1730: Call the Dialysis Unit ext. [phone]   ?? After 1730 and all day Sunday: Call the Dialysis RN Pager Number [phone]     PROCEDURE REVIEW, VERIFICATION, HANDOFF:  CRRT settings verified, procedure reviewed, and instructions given to primary RN.     Primary CRRT RN Verifying: Azzie Glatter, RN Dialysis RN Verifying: Barnie Mort

## 2021-09-13 NOTE — Unmapped (Signed)
Patient remained on ventilator throughout this shift on PSV 10/5 and minimal oxygen without incidence. Respiratory safety checks completed: see notes. ETT patent and secure. Respiratory to continue to monitor.

## 2021-09-13 DEATH — deceased

## 2021-09-14 LAB — HEPATIC FUNCTION PANEL
ALBUMIN: 1.5 g/dL — ABNORMAL LOW (ref 3.4–5.0)
ALKALINE PHOSPHATASE: 776 U/L — ABNORMAL HIGH (ref 46–116)
ALT (SGPT): 133 U/L — ABNORMAL HIGH (ref 10–49)
AST (SGOT): 340 U/L — ABNORMAL HIGH (ref <=34)
BILIRUBIN DIRECT: 0.9 mg/dL — ABNORMAL HIGH (ref 0.00–0.30)
BILIRUBIN TOTAL: 1.2 mg/dL (ref 0.3–1.2)
PROTEIN TOTAL: 4.7 g/dL — ABNORMAL LOW (ref 5.7–8.2)

## 2021-09-14 LAB — BASIC METABOLIC PANEL
ANION GAP: 4 mmol/L — ABNORMAL LOW (ref 5–14)
ANION GAP: 7 mmol/L (ref 5–14)
ANION GAP: 6 mmol/L (ref 5–14)
BLOOD UREA NITROGEN: 34 mg/dL — ABNORMAL HIGH (ref 9–23)
BLOOD UREA NITROGEN: 40 mg/dL — ABNORMAL HIGH (ref 9–23)
BLOOD UREA NITROGEN: 36 mg/dL — ABNORMAL HIGH (ref 9–23)
BUN / CREAT RATIO: 23
BUN / CREAT RATIO: 29
BUN / CREAT RATIO: 25
CALCIUM: 8.5 mg/dL — ABNORMAL LOW (ref 8.7–10.4)
CALCIUM: 8.7 mg/dL (ref 8.7–10.4)
CALCIUM: 8.7 mg/dL (ref 8.7–10.4)
CHLORIDE: 102 mmol/L (ref 98–107)
CHLORIDE: 101 mmol/L (ref 98–107)
CHLORIDE: 104 mmol/L (ref 98–107)
CO2: 29 mmol/L (ref 20.0–31.0)
CO2: 26 mmol/L (ref 20.0–31.0)
CO2: 27 mmol/L (ref 20.0–31.0)
CREATININE: 1.45 mg/dL — ABNORMAL HIGH
CREATININE: 1.39 mg/dL — ABNORMAL HIGH
CREATININE: 1.42 mg/dL — ABNORMAL HIGH
EGFR CKD-EPI (2021) MALE: 53 mL/min/{1.73_m2} — ABNORMAL LOW (ref >=60)
EGFR CKD-EPI (2021) MALE: 56 mL/min/{1.73_m2} — ABNORMAL LOW (ref >=60)
EGFR CKD-EPI (2021) MALE: 54 mL/min/{1.73_m2} — ABNORMAL LOW (ref >=60)
GLUCOSE RANDOM: 185 mg/dL — ABNORMAL HIGH (ref 70–179)
GLUCOSE RANDOM: 172 mg/dL (ref 70–179)
GLUCOSE RANDOM: 122 mg/dL (ref 70–179)
POTASSIUM: 4.8 mmol/L (ref 3.4–4.8)
POTASSIUM: 4.9 mmol/L — ABNORMAL HIGH (ref 3.4–4.8)
POTASSIUM: 4.9 mmol/L — ABNORMAL HIGH (ref 3.4–4.8)
SODIUM: 135 mmol/L (ref 135–145)
SODIUM: 134 mmol/L — ABNORMAL LOW (ref 135–145)
SODIUM: 137 mmol/L (ref 135–145)

## 2021-09-14 LAB — BLOOD GAS CRITICAL CARE PANEL, VENOUS
BASE EXCESS VENOUS: 3.2 — ABNORMAL HIGH (ref -2.0–2.0)
BASE EXCESS VENOUS: 2.5 — ABNORMAL HIGH (ref -2.0–2.0)
BASE EXCESS VENOUS: 2.6 — ABNORMAL HIGH (ref -2.0–2.0)
CALCIUM IONIZED VENOUS (MG/DL): 5.37 mg/dL (ref 4.40–5.40)
CALCIUM IONIZED VENOUS (MG/DL): 5.23 mg/dL (ref 4.40–5.40)
CALCIUM IONIZED VENOUS (MG/DL): 5.36 mg/dL (ref 4.40–5.40)
FIO2 VENOUS: 35
GLUCOSE WHOLE BLOOD: 181 mg/dL — ABNORMAL HIGH (ref 70–179)
GLUCOSE WHOLE BLOOD: 171 mg/dL (ref 70–179)
GLUCOSE WHOLE BLOOD: 153 mg/dL (ref 70–179)
HCO3 VENOUS: 27 mmol/L (ref 22–27)
HCO3 VENOUS: 27 mmol/L (ref 22–27)
HCO3 VENOUS: 27 mmol/L (ref 22–27)
HEMOGLOBIN BLOOD GAS: 8.1 g/dL — ABNORMAL LOW
HEMOGLOBIN BLOOD GAS: 8.1 g/dL — ABNORMAL LOW
HEMOGLOBIN BLOOD GAS: 8.3 g/dL — ABNORMAL LOW
LACTATE BLOOD VENOUS: 1.5 mmol/L (ref 0.5–1.8)
LACTATE BLOOD VENOUS: 1.3 mmol/L (ref 0.5–1.8)
LACTATE BLOOD VENOUS: 1.4 mmol/L (ref 0.5–1.8)
O2 SATURATION VENOUS: 74.9 % (ref 40.0–85.0)
O2 SATURATION VENOUS: 89.4 % — ABNORMAL HIGH (ref 40.0–85.0)
O2 SATURATION VENOUS: 82 % (ref 40.0–85.0)
PCO2 VENOUS: 44 mmHg (ref 40–60)
PCO2 VENOUS: 42 mmHg (ref 40–60)
PCO2 VENOUS: 42 mmHg (ref 40–60)
PH VENOUS: 7.41 (ref 7.32–7.43)
PH VENOUS: 7.42 (ref 7.32–7.43)
PH VENOUS: 7.42 (ref 7.32–7.43)
PO2 VENOUS: 36 mmHg (ref 30–55)
PO2 VENOUS: 55 mmHg (ref 30–55)
PO2 VENOUS: 45 mmHg (ref 30–55)
POTASSIUM WHOLE BLOOD: 4.8 mmol/L — ABNORMAL HIGH (ref 3.4–4.6)
POTASSIUM WHOLE BLOOD: 4.8 mmol/L — ABNORMAL HIGH (ref 3.4–4.6)
POTASSIUM WHOLE BLOOD: 4.8 mmol/L — ABNORMAL HIGH (ref 3.4–4.6)
SODIUM WHOLE BLOOD: 136 mmol/L (ref 135–145)
SODIUM WHOLE BLOOD: 135 mmol/L (ref 135–145)
SODIUM WHOLE BLOOD: 135 mmol/L (ref 135–145)

## 2021-09-14 LAB — PHOSPHORUS
PHOSPHORUS: 1.9 mg/dL — ABNORMAL LOW (ref 2.4–5.1)
PHOSPHORUS: 2.4 mg/dL (ref 2.4–5.1)
PHOSPHORUS: 2.7 mg/dL (ref 2.4–5.1)

## 2021-09-14 LAB — APTT
APTT: 40.2 s — ABNORMAL HIGH (ref 25.1–36.5)
APTT: 38.5 s — ABNORMAL HIGH (ref 25.1–36.5)
APTT: 39.5 s — ABNORMAL HIGH (ref 25.1–36.5)
HEPARIN CORRELATION: 0.2
HEPARIN CORRELATION: 0.2
HEPARIN CORRELATION: 0.2

## 2021-09-14 LAB — CBC
HEMATOCRIT: 24.4 % — ABNORMAL LOW (ref 39.0–48.0)
HEMATOCRIT: 25 % — ABNORMAL LOW (ref 39.0–48.0)
HEMOGLOBIN: 7.8 g/dL — ABNORMAL LOW (ref 12.9–16.5)
HEMOGLOBIN: 8 g/dL — ABNORMAL LOW (ref 12.9–16.5)
MEAN CORPUSCULAR HEMOGLOBIN CONC: 32.1 g/dL (ref 32.0–36.0)
MEAN CORPUSCULAR HEMOGLOBIN CONC: 31.9 g/dL — ABNORMAL LOW (ref 32.0–36.0)
MEAN CORPUSCULAR HEMOGLOBIN: 27 pg (ref 25.9–32.4)
MEAN CORPUSCULAR HEMOGLOBIN: 27.1 pg (ref 25.9–32.4)
MEAN CORPUSCULAR VOLUME: 84 fL (ref 77.6–95.7)
MEAN CORPUSCULAR VOLUME: 84.9 fL (ref 77.6–95.7)
MEAN PLATELET VOLUME: 10.8 fL — ABNORMAL HIGH (ref 6.8–10.7)
MEAN PLATELET VOLUME: 10.3 fL (ref 6.8–10.7)
PLATELET COUNT: 79 10*9/L — ABNORMAL LOW (ref 150–450)
PLATELET COUNT: 72 10*9/L — ABNORMAL LOW (ref 150–450)
RED BLOOD CELL COUNT: 2.91 10*12/L — ABNORMAL LOW (ref 4.26–5.60)
RED BLOOD CELL COUNT: 2.95 10*12/L — ABNORMAL LOW (ref 4.26–5.60)
RED CELL DISTRIBUTION WIDTH: 15.8 % — ABNORMAL HIGH (ref 12.2–15.2)
RED CELL DISTRIBUTION WIDTH: 15.7 % — ABNORMAL HIGH (ref 12.2–15.2)
WBC ADJUSTED: 30.3 10*9/L — ABNORMAL HIGH (ref 3.6–11.2)
WBC ADJUSTED: 28.6 10*9/L — ABNORMAL HIGH (ref 3.6–11.2)

## 2021-09-14 LAB — MAGNESIUM
MAGNESIUM: 1.9 mg/dL (ref 1.6–2.6)
MAGNESIUM: 1.8 mg/dL (ref 1.6–2.6)
MAGNESIUM: 1.9 mg/dL (ref 1.6–2.6)

## 2021-09-14 LAB — CBC W/ AUTO DIFF
BASOPHILS ABSOLUTE COUNT: 0 10*9/L (ref 0.0–0.1)
BASOPHILS RELATIVE PERCENT: 0.1 %
EOSINOPHILS ABSOLUTE COUNT: 0 10*9/L (ref 0.0–0.5)
EOSINOPHILS RELATIVE PERCENT: 0.1 %
HEMATOCRIT: 23.7 % — ABNORMAL LOW (ref 39.0–48.0)
HEMOGLOBIN: 7.6 g/dL — ABNORMAL LOW (ref 12.9–16.5)
LYMPHOCYTES ABSOLUTE COUNT: 0.8 10*9/L — ABNORMAL LOW (ref 1.1–3.6)
LYMPHOCYTES RELATIVE PERCENT: 2.8 %
MEAN CORPUSCULAR HEMOGLOBIN CONC: 32.1 g/dL (ref 32.0–36.0)
MEAN CORPUSCULAR HEMOGLOBIN: 27.2 pg (ref 25.9–32.4)
MEAN CORPUSCULAR VOLUME: 84.5 fL (ref 77.6–95.7)
MEAN PLATELET VOLUME: 11.2 fL — ABNORMAL HIGH (ref 6.8–10.7)
MONOCYTES ABSOLUTE COUNT: 0.9 10*9/L — ABNORMAL HIGH (ref 0.3–0.8)
MONOCYTES RELATIVE PERCENT: 3.2 %
NEUTROPHILS ABSOLUTE COUNT: 26.3 10*9/L — ABNORMAL HIGH (ref 1.8–7.8)
NEUTROPHILS RELATIVE PERCENT: 93.8 %
PLATELET COUNT: 81 10*9/L — ABNORMAL LOW (ref 150–450)
RED BLOOD CELL COUNT: 2.81 10*12/L — ABNORMAL LOW (ref 4.26–5.60)
RED CELL DISTRIBUTION WIDTH: 15.9 % — ABNORMAL HIGH (ref 12.2–15.2)
WBC ADJUSTED: 28 10*9/L — ABNORMAL HIGH (ref 3.6–11.2)

## 2021-09-14 LAB — TACROLIMUS LEVEL, TROUGH: TACROLIMUS, TROUGH: 4 ng/mL — ABNORMAL LOW (ref 5.0–15.0)

## 2021-09-14 LAB — PROTIME-INR
INR: 1.4
INR: 1.34
INR: 1.32
PROTIME: 16.1 s — ABNORMAL HIGH (ref 9.8–12.8)
PROTIME: 15.3 s — ABNORMAL HIGH (ref 9.8–12.8)
PROTIME: 15.1 s — ABNORMAL HIGH (ref 9.8–12.8)

## 2021-09-14 LAB — D-DIMER, QUANTITATIVE
D-DIMER QUANTITATIVE (CW,ML,HL,HS,CH,JS,JC): 1882 ng{FEU}/mL — ABNORMAL HIGH (ref <=500)
D-DIMER QUANTITATIVE (CW,ML,HL,HS,CH,JS,JC): 1795 ng{FEU}/mL — ABNORMAL HIGH (ref <=500)
D-DIMER QUANTITATIVE (CW,ML,HL,HS,CH,JS,JC): 1902 ng{FEU}/mL — ABNORMAL HIGH (ref <=500)

## 2021-09-14 LAB — FIBRINOGEN
FIBRINOGEN LEVEL: 458 mg/dL (ref 175–500)
FIBRINOGEN LEVEL: 400 mg/dL (ref 175–500)
FIBRINOGEN LEVEL: 387 mg/dL (ref 175–500)

## 2021-09-14 MED ADMIN — levothyroxine (SYNTHROID) tablet 250 mcg: 250 ug | GASTROENTERAL | @ 09:00:00

## 2021-09-14 MED ADMIN — white petrolatum-mineral oil (SOOTHE PM) 80-20 % ophthalmic ointment 1 application: 1 | OPHTHALMIC | @ 13:00:00

## 2021-09-14 MED ADMIN — magnesium oxide (MAG-OX) tablet 400 mg: 400 mg | GASTROENTERAL | @ 13:00:00

## 2021-09-14 MED ADMIN — vancomycin (VANCOCIN) 750 mg in sodium chloride (NS) 0.9 % 250 mL IVPB (premix): 750 mg | INTRAVENOUS | @ 22:00:00 | Stop: 2021-09-17

## 2021-09-14 MED ADMIN — esomeprazole (NexIUM) granules 40 mg: 40 mg | GASTROENTERAL | @ 09:00:00

## 2021-09-14 MED ADMIN — predniSONE (DELTASONE) tablet 15 mg: 15 mg | ORAL | @ 13:00:00

## 2021-09-14 MED ADMIN — cefepime (MAXIPIME) 1 g in sodium chloride 0.9 % (NS) 100 mL IVPB-connector bag: 1 g | INTRAVENOUS | @ 08:00:00 | Stop: 2021-09-17

## 2021-09-14 MED ADMIN — sodium phosphate 15 mmol in dextrose 5 % 250 mL IVPB: 15 mmol | INTRAVENOUS | @ 13:00:00 | Stop: 2021-09-14

## 2021-09-14 MED ADMIN — atorvastatin (LIPITOR) tablet 20 mg: 20 mg | GASTROENTERAL | @ 01:00:00

## 2021-09-14 MED ADMIN — heparin (porcine) 5,000 unit/mL injection 5,000 Units: 5000 [IU] | SUBCUTANEOUS | @ 09:00:00

## 2021-09-14 MED ADMIN — chlorhexidine (PERIDEX) 0.12 % solution 5 mL: 5 mL | OROMUCOSAL | @ 12:00:00

## 2021-09-14 MED ADMIN — NxStage/multiBic RFP 401 (+/- BB) 5000 mL - contains 4 mEq/L of potassium dialysis solution 5,000 mL: 5000 mL | INTRAVENOUS_CENTRAL | @ 02:00:00

## 2021-09-14 MED ADMIN — tacrolimus (PROGRAF) oral suspension: 1 mg | GASTROENTERAL | @ 13:00:00

## 2021-09-14 MED ADMIN — chlorhexidine (PERIDEX) 0.12 % solution 5 mL: 5 mL | OROMUCOSAL | @ 01:00:00

## 2021-09-14 MED ADMIN — white petrolatum-mineral oil (SOOTHE PM) 80-20 % ophthalmic ointment 1 application: 1 | OPHTHALMIC | @ 01:00:00

## 2021-09-14 MED ADMIN — heparin (porcine) 5,000 unit/mL injection 5,000 Units: 5000 [IU] | SUBCUTANEOUS | @ 02:00:00

## 2021-09-14 MED ADMIN — levoFLOXacin (LEVAQUIN) 500 mg/100 mL IVPB 500 mg: 500 mg | INTRAVENOUS | @ 01:00:00 | Stop: 2021-09-15

## 2021-09-14 MED ADMIN — cefepime (MAXIPIME) 1 g in sodium chloride 0.9 % (NS) 100 mL IVPB-connector bag: 1 g | INTRAVENOUS | @ 01:00:00 | Stop: 2021-09-16

## 2021-09-14 MED ADMIN — vancomycin (VANCOCIN) IVPB 1000 mg (premix): 1000 mg | INTRAVENOUS | @ 01:00:00 | Stop: 2021-09-13

## 2021-09-14 MED ADMIN — heparin (porcine) 5,000 unit/mL injection 5,000 Units: 5000 [IU] | SUBCUTANEOUS | @ 18:00:00

## 2021-09-14 MED ADMIN — NxStage/multiBic RFP 401 (+/- BB) 5000 mL - contains 4 mEq/L of potassium dialysis solution 5,000 mL: 5000 mL | INTRAVENOUS_CENTRAL | @ 01:00:00

## 2021-09-14 MED ADMIN — cefepime (MAXIPIME) 1 g in sodium chloride 0.9 % (NS) 100 mL IVPB-connector bag: 1 g | INTRAVENOUS | @ 17:00:00 | Stop: 2021-09-17

## 2021-09-14 MED ADMIN — magnesium oxide (MAG-OX) tablet 400 mg: 400 mg | GASTROENTERAL | @ 01:00:00

## 2021-09-14 MED ADMIN — acetaminophen (TYLENOL) tablet 1,000 mg: 1000 mg | GASTROENTERAL | @ 06:00:00 | Stop: 2021-09-14

## 2021-09-14 MED ADMIN — aspirin chewable tablet 81 mg: 81.0 mg | GASTROENTERAL | @ 13:00:00

## 2021-09-14 MED ADMIN — tacrolimus (PROGRAF) oral suspension: 1 mg | GASTROENTERAL | @ 01:00:00

## 2021-09-14 NOTE — Unmapped (Signed)
Tacrolimus Therapeutic Monitoring Pharmacy Note    Miguel Ward is a 70 y.o. male continuing tacrolimus.     Indication: Kidney transplant     Date of Transplant: 06/11/2007      Prior Dosing Information: Home regimen 3 mg BID      Goals:  Therapeutic Drug Levels  Tacrolimus trough goal: 2-4 mg/mL -- reduced in the setting of respiratory failure 2/2 legionella pneumonia    Additional Clinical Monitoring/Outcomes  ?? Monitor renal function (SCr and urine output) and liver function (LFTs)  ?? Monitor for signs/symptoms of adverse events (e.g., hyperglycemia, hyperkalemia, hypomagnesemia, hypertension, headache, tremor)    Results:     Pharmacokinetic Considerations and Significant Drug Interactions:  ??? Concurrent hepatotoxic medications: APAP (q8h schedule) - Atorva:   BOTH DC'd 10/2)  ??? Concurrent CYP3A4 substrates/inhibitors: at time of note  ??? Concurrent nephrotoxic medications: Lasix, acyclovir    Tacrolimus level:   10/1 tacro = 4.3ng/mL at 0609   10/2 tacro = 4.0ng/mL at 0545    Assessment/Plan:   10/2 tacro level drawn 'earl'y but true trough would still be within goal range    Recommendedation(s)  ??? continue tacrolimus SUSP 1 mg BID   ??? Draw next tacro closer to actual dose for true trough value    Follow-up  ??? Daily levels for now.   ??? A pharmacist will continue to monitor and recommend levels as appropriate    Please page service pharmacist with questions/clarifications.    Drue Flirt, PharmD

## 2021-09-14 NOTE — Unmapped (Signed)
Respiratory Safety Check    Date: 09/13/2021     PPV Device at bedside:   [x]  Yes  []  Replaced  []  N/A    PPV mask at bedside:  [x]  Yes  []  Replaced  []  N/A    Checked humidifier, inspiratory limb, and expiratory limb:   [x]  Yes  []  Replaced  []  N/A    Backup trachs and supplies available at bedside:  []  Yes  []  Replaced  [x]  N/A    Documented trach airway form visible at bedside:   []  Yes  []  Replaced  [x]  N/A    iNO connected with PPV device:  []  Yes  []  Replaced  [x]  N/A

## 2021-09-14 NOTE — Unmapped (Signed)
Patient remained on ventilator throughout this shift on PSV 10/5 and minimal oxygen without incidence. Moderate amount of thick secretions noted.  Respiratory safety checks completed: see notes. ETT patent and secure. Respiratory to continue to monitor.

## 2021-09-14 NOTE — Unmapped (Signed)
Pt remains rass -1, able to follow commands. CRRT running, UF at 50. Requiring levo (1) for MAPs > 65. Uneventful shift.       Problem: Fall Injury Risk  Goal: Absence of Fall and Fall-Related Injury  Outcome: Ongoing - Unchanged     Problem: Infection  Goal: Absence of Infection Signs and Symptoms  Outcome: Ongoing - Unchanged     Problem: Adult Inpatient Plan of Care  Goal: Plan of Care Review  Outcome: Ongoing - Unchanged  Goal: Patient-Specific Goal (Individualized)  Outcome: Ongoing - Unchanged  Goal: Absence of Hospital-Acquired Illness or Injury  Outcome: Ongoing - Unchanged  Intervention: Prevent and Manage VTE (Venous Thromboembolism) Risk  Recent Flowsheet Documentation  Taken 09/14/2021 0400 by Estanislado Pandy, RN  Activity Management: bedrest  Taken 09/14/2021 0200 by Estanislado Pandy, RN  Activity Management: bedrest  Taken 09/14/2021 0000 by Estanislado Pandy, RN  Activity Management: bedrest  Taken 09/13/2021 2200 by Estanislado Pandy, RN  Activity Management: bedrest  Taken 09/13/2021 2000 by Alena Bills Christop Hippert, RN  Activity Management: bedrest  Goal: Optimal Comfort and Wellbeing  Outcome: Ongoing - Unchanged  Goal: Readiness for Transition of Care  Outcome: Ongoing - Unchanged  Goal: Rounds/Family Conference  Outcome: Ongoing - Unchanged     Problem: Skin Injury Risk Increased  Goal: Skin Health and Integrity  Outcome: Ongoing - Unchanged  Intervention: Optimize Skin Protection  Recent Flowsheet Documentation  Taken 09/14/2021 0400 by Alena Bills Viraj Liby, RN  Pressure Reduction Techniques: frequent weight shift encouraged  Head of Bed (HOB) Positioning: HOB at 30 degrees  Pressure Reduction Devices: pressure-redistributing mattress utilized  Taken 09/14/2021 0200 by Alena Bills Zaylen Susman, RN  Pressure Reduction Techniques: frequent weight shift encouraged  Head of Bed (HOB) Positioning: HOB at 30 degrees  Pressure Reduction Devices: pressure-redistributing mattress utilized  Taken 09/14/2021 0000 by Alena Bills Malcom Selmer, RN  Pressure Reduction Techniques: frequent weight shift encouraged  Head of Bed (HOB) Positioning: HOB at 30 degrees  Pressure Reduction Devices: pressure-redistributing mattress utilized  Taken 09/13/2021 2200 by Alena Bills Kammy Klett, RN  Pressure Reduction Techniques: frequent weight shift encouraged  Head of Bed (HOB) Positioning: HOB at 30 degrees  Pressure Reduction Devices: pressure-redistributing mattress utilized  Taken 09/13/2021 2000 by Alena Bills Davi Rotan, RN  Pressure Reduction Techniques: frequent weight shift encouraged  Head of Bed (HOB) Positioning: HOB at 30 degrees  Pressure Reduction Devices: pressure-redistributing mattress utilized     Problem: Dysrhythmia (Heart Failure)  Goal: Stable Heart Rate and Rhythm  Outcome: Ongoing - Unchanged     Problem: Fluid Imbalance (Heart Failure)  Goal: Fluid Balance  Outcome: Ongoing - Unchanged     Problem: Respiratory Compromise (Heart Failure)  Goal: Effective Oxygenation and Ventilation  Outcome: Ongoing - Unchanged     Problem: Device-Related Complication Risk (Mechanical Ventilation, Invasive)  Goal: Optimal Device Function  Outcome: Ongoing - Unchanged     Problem: Nutrition Impairment (Mechanical Ventilation, Invasive)  Goal: Optimal Nutrition Delivery  Outcome: Ongoing - Unchanged     Problem: Non-Violent Restraints  Goal: Patient will remain free of restraint events  Outcome: Ongoing - Unchanged  Goal: Patient will remain free of physical injury  Outcome: Ongoing - Unchanged     Problem: Impaired Wound Healing  Goal: Optimal Wound Healing  Outcome: Ongoing - Unchanged  Intervention: Promote Wound Healing  Recent Flowsheet Documentation  Taken 09/14/2021 0400 by Estanislado Pandy, RN  Activity Management: bedrest  Taken 09/14/2021 0200 by Estanislado Pandy, RN  Activity Management: bedrest  Taken 09/14/2021 0000 by Estanislado Pandy, RN  Activity Management: bedrest  Taken 09/13/2021 2200 by Estanislado Pandy, RN  Activity Management: bedrest  Taken 09/13/2021 2000 by Estanislado Pandy, RN  Activity Management: bedrest     Problem: Self-Care Deficit  Goal: Improved Ability to Complete Activities of Daily Living  Outcome: Ongoing - Unchanged     Problem: Diabetes Comorbidity  Goal: Blood Glucose Level Within Targeted Range  Outcome: Ongoing - Unchanged     Problem: Heart Failure Comorbidity  Goal: Maintenance of Heart Failure Symptom Control  Outcome: Ongoing - Unchanged     Problem: Hypertension Comorbidity  Goal: Blood Pressure in Desired Range  Outcome: Ongoing - Unchanged

## 2021-09-14 NOTE — Unmapped (Signed)
[facility] Nephrology Continuous Renal Replacement Therapy Procedure Note     09/14/2021    Miguel Ward was seen and examined on CRRT    CHIEF COMPLAINT: Acute Kidney Disease    INTERVAL HISTORY:  Tolerating CRRt well with low norepinephrine requirements. Intubated but on minimal FIO2 support of 30 %.  Net output +1L/24 hrs     CURRENT DIALYSIS PRESCRIPTION:  Device: CRRT Device: NxStage (CRRT note)  Therapy fluid: Therapy Fluid : NxStage RFP 401 - Contains 4 mEq/L KCL  Therapy fluid rate: Therapy Fluid Rate (L/hr): 1.7 L/hr  Blood flow rate: Blood Pump Rate (mL/min): 350 mL/min  Fluid removal rate: Hourly Fluid Removal Rate (mL/hr): 50 mL/hr    PHYSICAL EXAM:  Vitals:  Temp:  [36.3 ??C (97.3 ??F)-36.7 ??C (98.1 ??F)] 36.3 ??C (97.3 ??F)  Heart Rate:  [76-98] 82  SpO2 Pulse:  [68-102] 68  BP: (92-142)/(33-86) 131/65  MAP (mmHg):  [52-107] 91    In/Outs:    Intake/Output Summary (Last 24 hours) at 09/14/2021 1111  Last data filed at 09/14/2021 0945  Gross per 24 hour   Intake 2005.76 ml   Output 1058 ml   Net 947.76 ml        Weights:  Admission Weight: 81.6 kg (180 lb)  Last documented Weight: 71.7 kg (158 lb 1.1 oz)  Weight Change from Previous Day: No weight listed for specified days    Assessment:   General: Appearing ill  Pulmonary: rhonchi  Cardiovascular: regular rate and rhythm  Extremities: no significant  edema  Access: Right IJ non-tunneled catheter     LAB DATA:  Lab Results   Component Value Date    NA 135 09/14/2021    NA 136 09/14/2021    K 4.8 09/14/2021    K 4.8 (H) 09/14/2021    CL 102 09/14/2021    CO2 29.0 09/14/2021    BUN 34 (H) 09/14/2021    CREATININE 1.45 (H) 09/14/2021      Lab Results   Component Value Date    HCT 23.7 (L) 09/14/2021    HGB 7.6 (L) 09/14/2021    WBC 28.0 (H) 09/14/2021        ASSESSMENT/PLAN:  Acute Kidney Disease on Continuous Renal Replacement Therapy:  - UF goal: 50 mL/hr as tolerated, increase UF as tolerated to goal net even to -ve 1L/24 hrs  - Renally dose all medications    Payton Emerald, MD  [facility] Division of Nephrology & Hypertension

## 2021-09-14 NOTE — Unmapped (Signed)
Respiratory Safety Check    Date: 09/14/2021     PPV Device at bedside:   [x]  Yes  []  Replaced  []  N/A    PPV mask at bedside:  [x]  Yes  []  Replaced  []  N/A    Checked humidifier, inspiratory limb, and expiratory limb:   [x]  Yes  []  Replaced  []  N/A    Backup trachs and supplies available at bedside:  []  Yes  []  Replaced  [x]  N/A    Documented trach airway form visible at bedside:   []  Yes  []  Replaced  [x]  N/A    iNO connected with PPV device:  []  Yes  []  Replaced  [x]  N/A

## 2021-09-14 NOTE — Unmapped (Signed)
CVAD Liaison - Insertion Note      The CVAD Liaison was contacted for the insertion of Central Venous Access Device (CVAD).  A chart review performed.   Indication: Medications requiring central line    Prior to the start of the procedure, a time out was performed and the identity of the patient was confirmed via name, medical record number and date of birth.  The sterile field was prepared with necessary supplies and equipment verified.  Insertion site was prepped with chlorhexidine solution and allowed to dry.  Maximum sterile techniques was utilized.    CVAD was inserted by Dr. Smith Robert.  Catheter was aspirated and flushed.  Insertion site cleansed, and sterile dressing applied per manufacturer guidelines.  The Central Line Checklist was referenced.  CVAD Liaison was not present during entire procedure.  Report of the procedure given to the Primary Nurse.      Thank you for this consult,  Elizebeth Brooking RN, CVAD Liaison     Consult Time 75 minutes (min)

## 2021-09-14 NOTE — Unmapped (Signed)
Patient remains on the ventilator with no changes to the settings. Airway is patent and secured.  Will continue to monitor.

## 2021-09-14 NOTE — Unmapped (Signed)
CVAD Liaison - Bleeding CVAD Note    CVAD Liaison Nurse assessed the patient at the bed side. The patient has a(n) triple lumen catheter in left IJ.     Bloody drainage was observed. Manual pressure was held for 4 minutes. Statseal dressing was applied aseptically. Please call/page CVAD Liaison for any issues with this dressing or questions. Report given to the primary RN.    Thank you for this consult,  Ivery Quale RN, CVAD Liaison    Consult Time  30 minutes (min)

## 2021-09-14 NOTE — Unmapped (Deleted)
[facility] Nephrology Continuous Renal Replacement Therapy Procedure Note     09/14/2021    Miguel Ward was seen and examined on CRRT    CHIEF COMPLAINT: Acute Kidney Disease    INTERVAL HISTORY: Working on trying to remove more fluid. Awakes to voice today.    CURRENT DIALYSIS PRESCRIPTION:  Device: CRRT Device: NxStage (CRRT note)  Therapy fluid: Therapy Fluid : NxStage RFP 401 - Contains 4 mEq/L KCL  Therapy fluid rate: Therapy Fluid Rate (L/hr): 1.7 L/hr  Blood flow rate: Blood Pump Rate (mL/min): 350 mL/min  Fluid removal rate: Hourly Fluid Removal Rate (mL/hr): 50 mL/hr    PHYSICAL EXAM:  Vitals:  Temp:  [36.3 ??C (97.3 ??F)-36.7 ??C (98.1 ??F)] 36.3 ??C (97.3 ??F)  Heart Rate:  [76-98] 82  SpO2 Pulse:  [68-102] 68  BP: (92-142)/(33-86) 131/65  MAP (mmHg):  [52-107] 91    In/Outs:    Intake/Output Summary (Last 24 hours) at 09/14/2021 1112  Last data filed at 09/14/2021 0945  Gross per 24 hour   Intake 2005.76 ml   Output 1058 ml   Net 947.76 ml        Weights:  Admission Weight: 81.6 kg (180 lb)  Last documented Weight: 71.7 kg (158 lb 1.1 oz)  Weight Change from Previous Day: No weight listed for specified days    Assessment:   General: Appearing ill  Pulmonary: rhonchi  Cardiovascular: regular rate and rhythm  Extremities: no significant  edema  Access: Right IJ non-tunneled catheter     LAB DATA:  Lab Results   Component Value Date    NA 135 09/14/2021    NA 136 09/14/2021    K 4.8 09/14/2021    K 4.8 (H) 09/14/2021    CL 102 09/14/2021    CO2 29.0 09/14/2021    BUN 34 (H) 09/14/2021    CREATININE 1.45 (H) 09/14/2021      Lab Results   Component Value Date    HCT 23.7 (L) 09/14/2021    HGB 7.6 (L) 09/14/2021    WBC 28.0 (H) 09/14/2021        ASSESSMENT/PLAN:  Acute Kidney Disease on Continuous Renal Replacement Therapy:  - UF goal: 50 mL/hr as tolerated, increased parameters to target greater UF  - Renally dose all medications    Rexene Agent, MD  [facility] Division of Nephrology & Hypertension

## 2021-09-14 NOTE — Unmapped (Signed)
MICU Nightshift Note     Date of Service: 09/14/2021    Principal Problem:    Acute respiratory distress syndrome (ARDS) due to COVID-19 virus (CMS-HCC)  Active Problems:    Hypercholesterolemia    Hypertension    History of kidney transplant    Renal cell carcinoma (CMS-HCC)    Immunosuppression-related infectious disease (CMS-HCC)    Bilateral lower extremity edema    Elevated troponin I level    GERD (gastroesophageal reflux disease)    Diastolic heart failure (CMS-HCC)    Acute on chronic HFrEF (heart failure with reduced ejection fraction) (CMS-HCC)    COVID    ATN (acute tubular necrosis) (CMS-HCC)    Acute renal failure with tubular necrosis (CMS-HCC)  Resolved Problems:    * No resolved hospital problems. Jed Limerick cover summary       Bartholomew Boards, MD

## 2021-09-14 NOTE — Unmapped (Signed)
Vancomycin Therapeutic Monitoring Pharmacy Note    Miguel Ward is a 70 y.o. male continuing vancomycin. Date of therapy initiation: 08/19/2021, discontinued and resumed again 9/29    Indication: Sepsis, unknown source    Prior Dosing Information: Vancomycin 1000 mg IV q24 hours    Goals:  Therapeutic Drug Levels  Vancomycin trough goal: 10-20 mg/L    Additional Clinical Monitoring/Outcomes  Renal function, volume status (intake and output)    Results:  Vancomycin trough drawn on 09/13/21 at 1948: 15.7 mcg/L Drawn about an hour late (before next dose was given)    Wt Readings from Last 1 Encounters:   08/14/2021 71.7 kg (158 lb 1.1 oz)     Creatinine   Date Value Ref Range Status   09/13/2021 1.45 (H) 0.60 - 1.10 mg/dL Final   96/03/5408 8.11 (H) 0.60 - 1.10 mg/dL Final   91/47/8295 6.21 (H) 0.60 - 1.10 mg/dL Final       Pharmacokinetic Considerations and Significant Drug Interactions:  ??? on CRRT with estimated Ke ~ 0.0293  ??? Concurrent nephrotoxic meds: not applicable    Assessment/Plan:  Recommendation(s)  ??? Change current regimen to vancomycin 750 mg IV q24 hours   ??? The current goal is 10-20 but most likely source is a pneumonia. Therefore will target closer to the 10-15 range. After 2 doses trough was already greater than 15 so will dose reduce.  ??? Estimated trough on recommended regimen: 10-20 mg/L    Follow-up  ??? Level due: on 10/3 at 1800  ??? A pharmacist will continue to monitor and order levels as appropriate    Please page service pharmacist with questions/clarifications.    Deanna Artis, PharmD   PGY2 Critical Care Pharmacy Resident

## 2021-09-14 NOTE — Unmapped (Signed)
MICU Daily Progress Note     Date of Service: 09/14/2021    Problem List:   Principal Problem:    Acute respiratory distress syndrome (ARDS) due to COVID-19 virus (CMS-HCC)  Active Problems:    Hypercholesterolemia    Hypertension    History of kidney transplant    Renal cell carcinoma (CMS-HCC)    Immunosuppression-related infectious disease (CMS-HCC)    Bilateral lower extremity edema    Elevated troponin I level    GERD (gastroesophageal reflux disease)    Diastolic heart failure (CMS-HCC)    Acute on chronic HFrEF (heart failure with reduced ejection fraction) (CMS-HCC)    COVID    ATN (acute tubular necrosis) (CMS-HCC)    Acute renal failure with tubular necrosis (CMS-HCC)  Resolved Problems:    * No resolved hospital problems. *      HPI: Miguel Ward is a 70 y.o. male renal transplant from lupus nephritis (complex course over last 15 years since transplant followed here including renal cell) here with acute hypoxic respiratory failure, COVID 19 and shock with AKI.    24hr events:  - SDS stopped  - transaminases elevated w downtrend TB    Neurological   #acute metabolic encephalopathy  - analegsia prn  - CT head 9/29 no acute findings  - neuro exam improving >> sustained wakefulness and interaction, non focal    Pulmonary   #ARDS, Acute resp failure, resolving   - intubated 9/25 on minimal settings, passing SBTs yet precluded extubation for mentation  - bronched 9/29 secretions blood tinged in trach and mainstem airways, none seen in distal airways  - passed SBT w improved wakefulness and ability to clear cough, extubated to [place] on 10/2  - tolerating [place] well  - goal sats >94%,   - pulm toilet as tol, HOB>30deg  - peridex for vap ppx     Cardiovascular   #septic shock, elevated trop  #HFrEF, ho htn, hld, cad  - arrived from ED on levo 26--> weaned to 2 on 9/27 however since then has had a higher pressure requirement  - MAP goals >65  - formal echo 9/26 EF @ 30%, 2020 ECHO 40-45%  - cont home regimen asa statin  - hold home regimen metop, vasotec, lasix  - SDS 9/26--> taper 50 q12h -> stopped  10/1  - cont low dose levo for MAP>65    Renal   #AKI (baseline Cr 2.7-3), AGMA likely ATN 2/2 sepsis and hypotension  #hx renal transplant s/t lupus nephritis, c/b rejection and renal cell CA s/p cryo ablation  - foley for accurate I/O during critical illness   - worsening renal fxn and CRRT started 9/26. UF decreased to 10 with increased pressors requirement, improved after restarting CRRT, able to increase 50 and will increase to 100 this afternoon  - continue tac with goal of 2-4   - cont home prednisone at a higher dose 15 mg while acutely sick to be taper back down to home dose of 5 mg  - continue to hold cellcept   - cont magox and calcitriol  - replete electrolytes prn    Infectious Disease/Autoimmune   #COVID 19 infection  Symptom onset:??9/22  Exposure: unknown  COVID PCR+: neg x 3  (home test positive 9/23?)  Day of admission/transfer: 9/25  COVID Specific??Meds:??none given   Vaccination status: full & boosted x once, evushield given 8/17  3 PCRs since admission negative and taken off precautions   ??  9/25 Bcx neg  9/25 Ucx pending, UA 3 WBC  9/25 Lower resp cx, pending, OPF  9/25 SARS-CoV-2 negative for NP and trach aspirate  9/25 CrAg negative  9/26 RPP negative  9/26 Legionella Ag (urine) positive  9/26 Legionella PCR positive  9/26 CMV PCR negative  ??  Multifocal PNA:   - started on vanco/cefepime on 9/25  - MRSA screen neg and vanco stopped 9/27, restarted 9/29 in the setting of worsening pressor requirements and GPC seen on BAL from Bronch on 9/29  - CT Chest 9/29 c/w volume overload, pna and rt pleural effusion  - cont cefepime     Legionella, PNA   - levofloxacin started 9/26 continue for a 7 day course  ??  Cultures:  Blood Culture, Routine (no units)   Date Value   09/11/2021 No Growth at 48 hours     Urine Culture, Comprehensive (no units)   Date Value   08/23/2021 NO GROWTH     Lower Respiratory Culture (no units)   Date Value   09/11/2021 Specimen Not Processed   08/17/2021 OROPHARYNGEAL FLORA ISOLATED     WBC (10*9/L)   Date Value   09/14/2021 28.0 (H)     WBC, UA (/HPF)   Date Value   08/17/2021 <1        FEN/GI   #gerd  -  TF tol well at goal  - ppi for gi ppx  - bowel regimen held for loose stools    Transaminitis and Elevated bilirubin: overall all uptrending, [place] RUQ didn't demonstrated acute cholecystitis.  - continue to trend LFTs  - potential need for HIDA if continues to increase holding off given bilirubin downtrending     Malnutrition Assessment: Not done yet.  Body mass index is 22.68 kg/m??.        Heme/Coag   Rt groin bleeding s/p line removal, resolved  - was on heparin gtt for NSTEMI and afib- stopped 9/27  - continue SQH for DVT ppx  - HH, plts, coags acceptable  - no evid active bleeding  - SCDs for DVT ppx  - last transfused 9/27    Endocrine   #hypoglycemic, hypothryoid  - glucose within target range yes  - cont ADI + accu checks   - hold home regimen allopurinol  - cont home regimen synthroid    Integumentary   #NAI  - WOCN consulted for high risk skin assessment No. Reason: no alterations in skin.  - WOCN recs >> agree with assmt and plan 9/30  - cont pressure mitigating precautions per skin policy    Prophylaxis/LDA/Restraints/Consults   Can CVC be removed? No: dialysis catheter   Can A-line be removed? No: >moderate dose pressors  Can Foley be removed? No: Need continuous I/O  Mobility plan: Step 2 - Head of bed elevation (>60 degrees)    Feeding: Tube feeds at goal  Analgesia: Pain adequately controlled  Sedation SAT/SBT: Yes  Thromboembolic ppx: SQ heparin  Head of bed >30 degrees: Yes  Ulcer ppx: Yes, home use continued  Glucose within target range: Yes, in range    Does patient need/have an active type/screen? Yes    RASS at goal? No - adjusting sedation or order to reflect need  [place] Agitation Assessment Scale (RASS) : -1 (09/14/2021  8:00 AM)     Can antipsychotics be stopped? N/A, not on antipsychotics  CAM-ICU Result: Positive (09/10/2021  8:00 PM)      Would hospice care be appropriate for this patient? No, patient improving or  expected to improve  Any unaddressed hospice/palliative care needs? no    Patient Lines/Drains/Airways Status     Active Active Lines, Drains, & Airways     Name Placement date Placement time Site Days    ETT  7.5 08/23/2021  1337  -- 6    CVC Triple Lumen 09/13/21 Non-tunneled Left Internal jugular 09/13/21  1848  Internal jugular  less than 1    Hemodialysis Catheter With Distal Infusion Port 09/08/21 Right Internal jugular 09/08/21  1600  Internal jugular  5    NG/OG Tube Decompression 16 Fr. Center mouth 09/01/2021  1500  Center mouth  6    Urethral Catheter Temperature probe 16 Fr. 09/03/2021  1602  Temperature probe  6    Arteriovenous Fistula - Vein Graft  Access 11/01/18 Right;Upper Arm 11/01/18  --  Arm  1048              Patient Lines/Drains/Airways Status     Active Wounds     Name Placement date Placement time Site Days    Wound 09/09/21 Pressure Injury Sacrum Right;Mid dark red nonblanching with dark skin surrounding DTI 09/09/21  2130  Sacrum  4                Goals of Care     Code Status: Full Code    Designated Healthcare Decision Maker:  Miguel Ward current decisional capacity for healthcare decision-making is Full capacity. His designated healthcare decision maker(s) is/are   HCDM (patient stated preference): Tolle,Miguel Ward - [phone].    Subjective   Intubated and nodding head y/n approp, denies pain    Objective     Vitals - past 24 hours  Temp:  [36.3 ??C (97.3 ??F)-36.7 ??C (98.1 ??F)] 36.3 ??C (97.3 ??F)  Heart Rate:  [76-98] 82  SpO2 Pulse:  [68-102] 68  Resp:  [16-31] 17  BP: (92-142)/(33-86) 131/65  FiO2 (%):  [30 %-35 %] 30 %  SpO2:  [95 %-100 %] 99 % Intake/Output  I/O last 3 completed shifts:  In: 3433.9 [I.V.:443.9; NG/GT:1850; IV Piggyback:1140]  Out: 2379 [Urine:59; Emesis/NG output:20; Other:2300]     Physical Exam:    General: thin elderly male, intubated, in NAD   HEENT: PERRL, slightly icteric sclera w edema  CV: RRR, no m/r/g, 2+ BLE edema   Pulm: diminished throughout, scattered rhonchi, dec bases  GI: abd round, non tender  MSK: grossly intact, tone intact  Skin: grossly intact, warm  Neuro: FSC, CN grossly intact, non focal motor, BUE 2/5, BLE 2/5       Continuous Infusions:   ??? norepinephrine bitartrate-NS 4 mcg/min (09/14/21 0700)   ??? NxStage RFP 400 (+/- BB) 5000 mL - contains 2 mEq/L of potassium     ??? NxStage/multiBic RFP 401 (+/- BB) 5000 mL - contains 4 mEq/L of potassium         Scheduled Medications:   ??? aspirin  81 mg Enteral tube: gastric  Daily   ??? calcitRIOL  0.25 mcg Enteral tube: gastric  Q MWF   ??? Cefepime  1 g Intravenous [facility]   ??? chlorhexidine  5 mL [place] BID   ??? esomeprazole  40 mg Enteral tube: gastric  daily   ??? heparin (porcine)  5,000 Units Subcutaneous [facility]   ??? flu vacc qs2022-23 6mos up(PF)  0.5 mL Intramuscular During hospitalization   ??? levoFLOXacin  500 mg Intravenous Q24H   ??? levothyroxine  250 mcg Enteral tube: gastric  daily   ???  magnesium oxide  400 mg Enteral tube: gastric  BID   ??? predniSONE  15 mg Oral Daily   ??? sodium phosphate IVPB in 250 ml  15 mmol Intravenous Once   ??? Tacrolimus  1 mg Enteral tube: gastric  BID   ??? vancomycin  750 mg Intravenous Q24H   ??? white petrolatum-mineral oil  1 application Both Eyes BID       PRN medications:  dextrose in water, heparin (porcine), heparin (porcine), ondansetron    Data/Imaging Review: Reviewed in Epic and personally interpreted on 09/14/2021. See EMR for detailed results.         Critical Care Attestation     This patient is critically ill or injured with the impairment of vital organ systems such that there is a high probability of imminent or life threatening deterioration in the patient's condition. This patient must remain in the ICU for ongoing evaluation of the comprehensive management plan outlined in this note. I directly provided critical care services as documented in this note and the critical care time spent (55  min) is exclusive of separately billable procedures.  In addition to time spent for critical care management, I also provided advance care planning services for 0  minutes (see ACP note for details)???. Total billable critical care time 55  minutes.    Burnell Matlin Fonnie Mu, ACNP

## 2021-09-14 NOTE — Unmapped (Signed)
IMMUNOCOMPROMISED HOST INFECTIOUS DISEASE PROGRESS NOTE    Assessment/Plan:     Miguel Ward is a 70 y.o. 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 COVID tests negative x 3 here. Imaging and severity suggested alternative infection vs. superinfection, and thus patient started on broad-spectrum coverage with vancomycin/cefepime -> cefepime with some improvement.    Found to have legionellosis (Ag and PCR) and started on levofloxacin as well. Respiratory status has improved, however progress in hemodynamic and mental status have somewhat stagnated bringing up concern for other untreated etiology. CT chest with significant consolidations throughout R lung - appreciate team assistance with bronchoscopy, BAL cx unremarkable. LFTs, particularly alk-phos, have also been increasing despite negative imaging. We recommend continuing levofloxacin for now, extending therapy to at least 10 total days, up to 14 days pending clinical improvement. Given no cx growth can stop other antimicrobials at this time (vanc/cefepime)    ID Problem List:  ESRD 2/2 Lupus nephritis s/p deceased donor kidney transplant 05/2007  - Surgical complications: None  - Serologies: CMV D+/R-  - Immunosuppression: TAC 3/2, MMF 250, pred 5  - Prophylaxis prior to admission: acyclovir (hx HSV)  - Rejection history: Early cellular s/p ATG 2008        Pertinent Co-morbidities  SLE with lupus nephritis  CKDIV  RCC R native kidney s/p nephrectomy 02/2014  Suspected RCC transplanted kidney 12/2020, sp percutaneous cryoablation 06/2021  HFmREF (40-45% 06/2019)  CAD  HTN     Pertinent Exposure History  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 Legionella Ag positive, pt also at risk for other bacterial infection or other pathogens (fungi, PJP, CMV)  - 09/01/2021 found at home minimally responsive, brought to ED, intubated  - 08/16/2021 NP and tracheal specimens negative for SARS-CoV2  - 09/08/21 urine legionella antigen positive. PCR of tracheal aspirate positive  - 09/11/21 CT chest extensive mass-like consolidations with air bronchograms throughout R lung, some in LUL as well  - 09/11/21 Increasing WBC, LFTs (particularly ALP and bilirubin)  - 09/11/21 Bronch/BAL: Negative mycoplasma, chlamydiophila, PJP, EBV, CMV, RPP. Cx with 30K probable CoNS  Tx: Vancomycin/cefepime/remdesivir 9/25 -> added levofloxacin 9/26 -> cefepime/levofloxacin 9/27 -> Vanc/CFP/levofloxacin 9/29    #Increasing ALP and ALT/AST  - 09/03/2021 admission with mildly elevated Tbili/ALP and AST (though with elevated CK)  - 09/10/21 RUQ [place] no abnormalities, no doppler done  - 09/13/21 ALP increasing steadily up to 628. Tbili stabilized, AST/ALT increasing as well  - Along with WBC increase, somewhat concerning for drug reaction. Minimal suspicion CFP, low c/f vancomycin, not impossible but rare with levofloxacin    #COVID antigen positive at home 09/05/21  Three negative PCRs while inpatient. Unclear if implies false positive, perhaps resolving infection from weeks prior  - No remdesivir received     Prior  infections:  #Fever and leukocytosis, possible UTI 08/2016  #Liver lesions suspected to be abscesses  - Suspected UTI, improved on empiric broad spectrum therapy Vanc/CFP -> doxy  - CT new liver lesions, FDG avid on PET, concerning for metastases  - Liver biopsy 09/2016 nonspecific inflammatory changes suggesting resolving abscess  - MRI 01/2017 improved liver lesions     #RSV infection 02/2018  - Also diagnosed with decompensated diastolic and systolic HF     Antimicrobial Intolerance/allergy  Penicillins - hives (2014), tolerated cephalosporins       RECOMMENDATIONS    Diagnostic  ?? Please f/u: histo Ag  ?? Follow up repeat Hepatitis B serology - had positive core Ab in 2003  ?? Consider adding on GGT, CK  ?? Follow up RUQ [place] with doppler  ?? Follow up Bcx 9/29  ?? Follow up BAL cx: aerobic/anaerobic, fungal, AFB, Asp Ag, actinomyces  ?? CTM LFTs    Lab monitoring for antimicrobial toxicities  ?? CBC w/ diff and CMP at least weekly  ?? Please repeat EKG after starting quinolone therapy    Treatment  Multifocal pneumonia, Legionella antigen positive  ?? Continue Levofloxacin, CRRT dosed equivalent 500 mg daily  ?? Duration TBD, minimum 10 days, up to 14 days (through at least 09/17/21)  ?? STOP vancomycin and cefepime given negative cx data (minimal CoNS but do not suspect is driver of presentation)    Mixed cholestatic/hepatocellular liver injury  ?? Consider holding other potentially hepatotoxic medications, such as statin  ?? If continued worsening of WBC and LFTs, may need to consider alternative therapy to quinolone      Immunosuppression and prophylaxis  ?? IS deferred to transplant team, prefer to continue tacrolimus if SARS-2 positive  ?? Cont ppx with acyclovir           The [facility] ID service will continue to follow.  Please page the ID Transplant/Liquid Oncology Fellow consult at [phone] with questions.  Patient discussed with Dr. Julaine Hua.    Subjective:     Interval History:    History obtained from:patient and nurse.  Events since last encounter: NAEO, still passing SBTs  Today's HPI for primary problems: Pressors unable to be fully weaned. Mental status steadily improving. Vent settings still minimal. Considering extubation trial today.      Medications:  Antimicrobials:  Anti-infectives (From admission, onward)    Start     Dose/Rate Route Frequency Ordered Stop    09/14/21 1800  vancomycin (VANCOCIN) 750 mg in sodium chloride (NS) 0.9 % 250 mL IVPB (premix)         750 mg  333.3 mL/hr over 45 Minutes Intravenous Every 24 hours 09/13/21 2101 09/19/21 1759    09/09/21 1500  levoFLOXacin (LEVAQUIN) 500 mg/100 mL IVPB 500 mg        Followed by Linked Group Details    500 mg  100 mL/hr over 60 Minutes Intravenous Every 24 hours 09/08/21 1402 09/18/21 1959    09/08/21 2200  cefepime (MAXIPIME) 1 g in sodium chloride 0.9 % (NS) 100 mL IVPB-connector bag         1 g  200 mL/hr over 30 Minutes Intravenous Every 8 hours scheduled 09/08/21 1501 09/16/21 0359          Prior/Current immunomodulators:  Tacrolimus 3/2  MMF 250 bid  Prednisone 5    Other medications reviewed.    Objective:     Vital Signs last 24 hours:  Temp:  [36.3 ??C (97.3 ??F)-36.7 ??C (98.1 ??  F)] 36.3 ??C (97.3 ??F)  Heart Rate:  [76-98] 82  SpO2 Pulse:  [68-102] 68  Resp:  [16-31] 17  BP: (89-142)/(33-86) 131/65  MAP (mmHg):  [52-107] 91  FiO2 (%):  [30 %-35 %] 30 %  SpO2:  [95 %-100 %] 99 %    Physical Exam:   Patient Lines/Drains/Airways Status     Active Active Lines, Drains, & Airways     Name Placement date Placement time Site Days    ETT  7.5 09/29/21  1337  -- 6    CVC Triple Lumen 09/13/21 Non-tunneled Left Internal jugular 09/13/21  1848  Internal jugular  less than 1    Hemodialysis Catheter With Distal Infusion Port 09/08/21 Right Internal jugular 09/08/21  1600  Internal jugular  5    NG/OG Tube Decompression 16 Fr. Center mouth 29-Sep-2021  1500  Center mouth  6    Urethral Catheter Temperature probe 16 Fr. 09-29-21  1602  Temperature probe  6    Arteriovenous Fistula - Vein Graft  Access 11/01/18 Right;Upper Arm 11/01/18  --  Arm  1048              Const [x]  vital signs above    []  NAD, non-toxic appearance  Eyes open when entering room, tracks      Eyes []  Lids normal bilaterally, conjunctiva anicteric and noninjected OU     [] PERRL  [] EOMI  Swollen lids and conjunctiva. Pupils equal, reactive to light      ENMT [x]  Normal appearance of external nose and ears, no nasal discharge        []  MMM, no lesions on lips or gums []  No thrush, leukoplakia, oral lesions  []  Dentition good []  Edentulous []  Dental caries present  []  Hearing normal  []  TMs with good light reflexes bilaterally   ETT in place      Neck [x]  Neck of normal appearance and trachea midline        []  No thyromegaly, nodules, or tenderness   []  Full neck ROM  RIJ trialysis catheter in place, no bleeding, erythema. New LIJ catheter      Lymph [x]  No LAD in neck     []  No LAD in supraclavicular area     []  No LAD in axillae   []  No LAD in epitrochlear chains     []  No LAD in inguinal areas        CV [x]  RRR            [x]  No peripheral edema     []  Pedal pulses intact   []  No abnormal heart sounds appreciated   []  Extremities WWP         Resp [x]  Normal WOB at rest    []  No breathlessness with speaking, no coughing  []  CTA anteriorly    []  CTA bilaterally    Vent sounds anteriorly      GI []  Normal inspection, NTND   []  NABS     []  No umbilical hernia on exam       []  No hepatosplenomegaly     []  Inspection of perineal and perianal areas normal  Transplanted kidney palpable, no grimacing      GU []  Normal external genitalia     [] No urinary catheter present in urethra   []  No CVA tenderness    []  No tenderness over renal allograft  R fem cath removed, dressing in place. Sacral wound not visualized  MSK []  No clubbing or cyanosis of hands       []  No vertebral point tenderness  []  No focal tenderness or abnormalities on palpation of joints in RUE, LUE, RLE, or LLE        Skin []  No rashes, lesions, or ulcers of visualized skin     []  Skin warm and dry to palpation   RUE with AVG with thrill, dilated up to shoulder. LUE with graft without thrill Neuro _ Face expression symmetric  []  Sensation to light touch grossly intact throughout    []  Moves extremities equally    []  No tremor noted        []  CNs II-XII grossly intact     []  DTRs normal and symmetric throughout []  Gait unremarkable  Opens eyes easily to voice, following simple commands, shakes head appropriately to questions      Psych []  Appropriate affect       []  Fluent speech         []  Attentive, good eye contact  []  Oriented to person, place, time          []  Judgment and insight are appropriate   Sedated          Data for Medical Decision Making     Recent Labs   Lab Units 09/14/21  0400 09/11/21  0805 09/11/21  0430 09/08/21  0914 09/08/21  0534   WBC 10*9/L 28.0*   < > 19.9*   < >  --    HEMOGLOBIN g/dL 7.6*   < > 8.8*   < >  --    HEMOGLOBIN BG   --   --   --    < >  --    PLATELET COUNT (1) 10*9/L 81*   < > 72*   < >  --    NEUTRO ABS 10*9/L 26.3*   < > 19.4*   < >  --    LYMPHO ABS 10*9/L 0.8*   < > 0.2*   < >  --    EOSINO ABS 10*9/L 0.0   < > 0.0   < >  --    BUN mg/dL 34*   < > 33*   < > 90*   CREATININE mg/dL 1.61*   < > 0.96*   < > 5.40*   AST U/L 340*   < > 179*   < > 90*   ALT U/L 133*   < > 40   < > 21   BILIRUBIN TOTAL mg/dL 1.2   < > 2.4*   < > 1.9*   ALK PHOS U/L 776*   < > 305*   < > 87   POTASSIUM WHOLE BLOOD mmol/L 4.8*  --   --    < >  --    POTASSIUM mmol/L 4.8   < > 4.8   < > 5.2*   MAGNESIUM mg/dL 1.9   < > 2.0   < >  --    PHOSPHORUS mg/dL 1.9*   < > 2.0*   < >  --    CALCIUM mg/dL 8.5*   < > 8.5*   < > 8.8   CK TOTAL U/L  --   --  488.0*   < > 1,775.0*   CRP mg/L  --   --   --   --  317.0*    < > = values in this interval not displayed.     I reviewed and noted  the following labs: WBC 28.0, Hgb 7.6, Plt 81, Cr 1.45, ALT/AST 133/230, ALP 776 (increasing)    Microbiology:    9/25 Bcx pending  9/25 Ucx pending, UA 3 WBC  9/25 Lower resp cx, pending, OPF  9/25 SARS-CoV-2 negative for NP and trach aspirate  9/25 CrAg negative  9/26 RPP negative  9/26 Legionella Ag (urine) positive  9/26 Legionella PCR positive  9/26 CMV PCR negative  9/26 Fungitell negative  9/29 BAL cx OPF, 30K probably CoNS  9/29 BAL AFB smear negative, cx pending  9/29 PJP smear negative    Past cultures were reviewed in Epic and CareEverywhere  Review of cultures with microbiology: I reviewed the specimens with the microbiology team and agree with their assessment.    Imaging:    CXR 09/13/21:    IMPRESSION:  Interval placement of left IJ approach central venous catheter with tip terminating at the confluence of brachiocephalic veins. Otherwise, no significant interval changes when compared to prior CT chest dated 09/11/2021.         Independent visualization of images: I independently reviewed the image from 09/13/21 and I agree with the findings/interpretation.    Additional Studies:   (09/08/21) EKG [id number]

## 2021-09-15 LAB — BLOOD GAS CRITICAL CARE PANEL, VENOUS
BASE EXCESS VENOUS: 3.3 — ABNORMAL HIGH (ref -2.0–2.0)
BASE EXCESS VENOUS: 4.3 — ABNORMAL HIGH (ref -2.0–2.0)
BASE EXCESS VENOUS: 4.3 — ABNORMAL HIGH (ref -2.0–2.0)
CALCIUM IONIZED VENOUS (MG/DL): 5.15 mg/dL (ref 4.40–5.40)
CALCIUM IONIZED VENOUS (MG/DL): 5.18 mg/dL (ref 4.40–5.40)
CALCIUM IONIZED VENOUS (MG/DL): 5.27 mg/dL (ref 4.40–5.40)
GLUCOSE WHOLE BLOOD: 107 mg/dL (ref 70–179)
GLUCOSE WHOLE BLOOD: 148 mg/dL (ref 70–179)
GLUCOSE WHOLE BLOOD: 259 mg/dL — ABNORMAL HIGH (ref 70–179)
HCO3 VENOUS: 29 mmol/L — ABNORMAL HIGH (ref 22–27)
HCO3 VENOUS: 29 mmol/L — ABNORMAL HIGH (ref 22–27)
HCO3 VENOUS: 30 mmol/L — ABNORMAL HIGH (ref 22–27)
HEMOGLOBIN BLOOD GAS: 7.8 g/dL — ABNORMAL LOW (ref 13.50–17.50)
HEMOGLOBIN BLOOD GAS: 8 g/dL — ABNORMAL LOW (ref 13.50–17.50)
HEMOGLOBIN BLOOD GAS: 8.3 g/dL — ABNORMAL LOW (ref 13.50–17.50)
LACTATE BLOOD VENOUS: 0.9 mmol/L (ref 0.5–1.8)
LACTATE BLOOD VENOUS: 1 mmol/L (ref 0.5–1.8)
LACTATE BLOOD VENOUS: 1.4 mmol/L (ref 0.5–1.8)
O2 SATURATION VENOUS: 75.2 % (ref 40.0–85.0)
O2 SATURATION VENOUS: 77.4 % (ref 40.0–85.0)
O2 SATURATION VENOUS: 88.8 % — ABNORMAL HIGH (ref 40.0–85.0)
PCO2 VENOUS: 41 mmHg (ref 40–60)
PCO2 VENOUS: 47 mmHg (ref 40–60)
PCO2 VENOUS: 47 mmHg (ref 40–60)
PH VENOUS: 7.39 (ref 7.32–7.43)
PH VENOUS: 7.4 (ref 7.32–7.43)
PH VENOUS: 7.46 — ABNORMAL HIGH (ref 7.32–7.43)
PO2 VENOUS: 36 mmHg (ref 30–55)
PO2 VENOUS: 41 mmHg (ref 30–55)
PO2 VENOUS: 54 mmHg (ref 30–55)
POTASSIUM WHOLE BLOOD: 4.3 mmol/L (ref 3.4–4.6)
POTASSIUM WHOLE BLOOD: 4.6 mmol/L (ref 3.4–4.6)
POTASSIUM WHOLE BLOOD: 4.8 mmol/L — ABNORMAL HIGH (ref 3.4–4.6)
SODIUM WHOLE BLOOD: 135 mmol/L (ref 135–145)
SODIUM WHOLE BLOOD: 135 mmol/L (ref 135–145)
SODIUM WHOLE BLOOD: 136 mmol/L (ref 135–145)

## 2021-09-15 LAB — BASIC METABOLIC PANEL
ANION GAP: 7 mmol/L (ref 5–14)
ANION GAP: 8 mmol/L (ref 5–14)
ANION GAP: 5 mmol/L (ref 5–14)
BLOOD UREA NITROGEN: 34 mg/dL — ABNORMAL HIGH (ref 9–23)
BLOOD UREA NITROGEN: 36 mg/dL — ABNORMAL HIGH (ref 9–23)
BLOOD UREA NITROGEN: 39 mg/dL — ABNORMAL HIGH (ref 9–23)
BUN / CREAT RATIO: 25
BUN / CREAT RATIO: 27
BUN / CREAT RATIO: 28
CALCIUM: 8.8 mg/dL (ref 8.7–10.4)
CALCIUM: 8.6 mg/dL — ABNORMAL LOW (ref 8.7–10.4)
CALCIUM: 8.6 mg/dL — ABNORMAL LOW (ref 8.7–10.4)
CHLORIDE: 102 mmol/L (ref 98–107)
CHLORIDE: 101 mmol/L (ref 98–107)
CHLORIDE: 104 mmol/L (ref 98–107)
CO2: 26 mmol/L (ref 20.0–31.0)
CO2: 26 mmol/L (ref 20.0–31.0)
CO2: 28 mmol/L (ref 20.0–31.0)
CREATININE: 1.38 mg/dL — ABNORMAL HIGH
CREATININE: 1.34 mg/dL — ABNORMAL HIGH
CREATININE: 1.41 mg/dL — ABNORMAL HIGH
EGFR CKD-EPI (2021) MALE: 56 mL/min/{1.73_m2} — ABNORMAL LOW (ref >=60)
EGFR CKD-EPI (2021) MALE: 58 mL/min/{1.73_m2} — ABNORMAL LOW (ref >=60)
EGFR CKD-EPI (2021) MALE: 55 mL/min/{1.73_m2} — ABNORMAL LOW (ref >=60)
GLUCOSE RANDOM: 118 mg/dL (ref 70–179)
GLUCOSE RANDOM: 205 mg/dL — ABNORMAL HIGH (ref 70–179)
GLUCOSE RANDOM: 241 mg/dL — ABNORMAL HIGH (ref 70–179)
POTASSIUM: 4.5 mmol/L (ref 3.4–4.8)
POTASSIUM: 4.6 mmol/L (ref 3.4–4.8)
POTASSIUM: 5.1 mmol/L — ABNORMAL HIGH (ref 3.4–4.8)
SODIUM: 135 mmol/L (ref 135–145)
SODIUM: 135 mmol/L (ref 135–145)
SODIUM: 137 mmol/L (ref 135–145)

## 2021-09-15 LAB — CBC
HEMATOCRIT: 24.5 % — ABNORMAL LOW (ref 39.0–48.0)
HEMATOCRIT: 23.1 % — ABNORMAL LOW (ref 39.0–48.0)
HEMOGLOBIN: 7.8 g/dL — ABNORMAL LOW (ref 12.9–16.5)
HEMOGLOBIN: 7.3 g/dL — ABNORMAL LOW (ref 12.9–16.5)
MEAN CORPUSCULAR HEMOGLOBIN CONC: 31.7 g/dL — ABNORMAL LOW (ref 32.0–36.0)
MEAN CORPUSCULAR HEMOGLOBIN CONC: 31.6 g/dL — ABNORMAL LOW (ref 32.0–36.0)
MEAN CORPUSCULAR HEMOGLOBIN: 27.1 pg (ref 25.9–32.4)
MEAN CORPUSCULAR HEMOGLOBIN: 27 pg (ref 25.9–32.4)
MEAN CORPUSCULAR VOLUME: 85.4 fL (ref 77.6–95.7)
MEAN CORPUSCULAR VOLUME: 85.4 fL (ref 77.6–95.7)
MEAN PLATELET VOLUME: 10.4 fL (ref 6.8–10.7)
MEAN PLATELET VOLUME: 10.2 fL (ref 6.8–10.7)
PLATELET COUNT: 68 10*9/L — ABNORMAL LOW (ref 150–450)
PLATELET COUNT: 75 10*9/L — ABNORMAL LOW (ref 150–450)
RED BLOOD CELL COUNT: 2.87 10*12/L — ABNORMAL LOW (ref 4.26–5.60)
RED BLOOD CELL COUNT: 2.71 10*12/L — ABNORMAL LOW (ref 4.26–5.60)
RED CELL DISTRIBUTION WIDTH: 16.1 % — ABNORMAL HIGH (ref 12.2–15.2)
RED CELL DISTRIBUTION WIDTH: 15.9 % — ABNORMAL HIGH (ref 12.2–15.2)
WBC ADJUSTED: 27.9 10*9/L — ABNORMAL HIGH (ref 3.6–11.2)
WBC ADJUSTED: 27.9 10*9/L — ABNORMAL HIGH (ref 3.6–11.2)

## 2021-09-15 LAB — FIBRINOGEN
FIBRINOGEN LEVEL: 414 mg/dL (ref 175–500)
FIBRINOGEN LEVEL: 363 mg/dL (ref 175–500)
FIBRINOGEN LEVEL: 387 mg/dL (ref 175–500)

## 2021-09-15 LAB — CBC W/ AUTO DIFF
BASOPHILS ABSOLUTE COUNT: 0 10*9/L (ref 0.0–0.1)
BASOPHILS RELATIVE PERCENT: 0.1 %
EOSINOPHILS ABSOLUTE COUNT: 0.1 10*9/L (ref 0.0–0.5)
EOSINOPHILS RELATIVE PERCENT: 0.4 %
HEMATOCRIT: 24.7 % — ABNORMAL LOW (ref 39.0–48.0)
HEMOGLOBIN: 7.9 g/dL — ABNORMAL LOW (ref 12.9–16.5)
LYMPHOCYTES ABSOLUTE COUNT: 0.9 10*9/L — ABNORMAL LOW (ref 1.1–3.6)
LYMPHOCYTES RELATIVE PERCENT: 3.5 %
MEAN CORPUSCULAR HEMOGLOBIN CONC: 32.1 g/dL (ref 32.0–36.0)
MEAN CORPUSCULAR HEMOGLOBIN: 27.3 pg (ref 25.9–32.4)
MEAN CORPUSCULAR VOLUME: 85.1 fL (ref 77.6–95.7)
MEAN PLATELET VOLUME: 10.8 fL — ABNORMAL HIGH (ref 6.8–10.7)
MONOCYTES ABSOLUTE COUNT: 0.8 10*9/L (ref 0.3–0.8)
MONOCYTES RELATIVE PERCENT: 3.2 %
NEUTROPHILS ABSOLUTE COUNT: 24.5 10*9/L — ABNORMAL HIGH (ref 1.8–7.8)
NEUTROPHILS RELATIVE PERCENT: 92.8 %
PLATELET COUNT: 74 10*9/L — ABNORMAL LOW (ref 150–450)
RED BLOOD CELL COUNT: 2.9 10*12/L — ABNORMAL LOW (ref 4.26–5.60)
RED CELL DISTRIBUTION WIDTH: 15.7 % — ABNORMAL HIGH (ref 12.2–15.2)
WBC ADJUSTED: 26.4 10*9/L — ABNORMAL HIGH (ref 3.6–11.2)

## 2021-09-15 LAB — APTT
APTT: 39.6 s — ABNORMAL HIGH (ref 25.1–36.5)
APTT: 39.2 s — ABNORMAL HIGH (ref 25.1–36.5)
APTT: 39.6 s — ABNORMAL HIGH (ref 25.1–36.5)
HEPARIN CORRELATION: 0.2
HEPARIN CORRELATION: 0.2
HEPARIN CORRELATION: 0.2

## 2021-09-15 LAB — PHOSPHORUS
PHOSPHORUS: 2.2 mg/dL — ABNORMAL LOW (ref 2.4–5.1)
PHOSPHORUS: 1.7 mg/dL — ABNORMAL LOW (ref 2.4–5.1)
PHOSPHORUS: 2.4 mg/dL (ref 2.4–5.1)

## 2021-09-15 LAB — HEPATIC FUNCTION PANEL
ALBUMIN: 1.6 g/dL — ABNORMAL LOW (ref 3.4–5.0)
ALKALINE PHOSPHATASE: 881 U/L — ABNORMAL HIGH (ref 46–116)
ALT (SGPT): 163 U/L — ABNORMAL HIGH (ref 10–49)
AST (SGOT): 290 U/L — ABNORMAL HIGH (ref <=34)
BILIRUBIN DIRECT: 1 mg/dL — ABNORMAL HIGH (ref 0.00–0.30)
BILIRUBIN TOTAL: 1.4 mg/dL — ABNORMAL HIGH (ref 0.3–1.2)
PROTEIN TOTAL: 5 g/dL — ABNORMAL LOW (ref 5.7–8.2)

## 2021-09-15 LAB — MAGNESIUM
MAGNESIUM: 2 mg/dL (ref 1.6–2.6)
MAGNESIUM: 1.7 mg/dL (ref 1.6–2.6)
MAGNESIUM: 1.8 mg/dL (ref 1.6–2.6)

## 2021-09-15 LAB — D-DIMER, QUANTITATIVE
D-DIMER QUANTITATIVE (CW,ML,HL,HS,CH,JS,JC): 1895 ng{FEU}/mL — ABNORMAL HIGH (ref <=500)
D-DIMER QUANTITATIVE (CW,ML,HL,HS,CH,JS,JC): 1582 ng{FEU}/mL — ABNORMAL HIGH (ref <=500)
D-DIMER QUANTITATIVE (CW,ML,HL,HS,CH,JS,JC): 1502 ng{FEU}/mL — ABNORMAL HIGH (ref <=500)

## 2021-09-15 LAB — PROTIME-INR
INR: 1.4
INR: 1.41
INR: 1.36
PROTIME: 16.1 s — ABNORMAL HIGH (ref 9.8–12.8)
PROTIME: 16.2 s — ABNORMAL HIGH (ref 9.8–12.8)
PROTIME: 15.6 s — ABNORMAL HIGH (ref 9.8–12.8)

## 2021-09-15 LAB — TACROLIMUS LEVEL, TROUGH: TACROLIMUS, TROUGH: 2.9 ng/mL — ABNORMAL LOW (ref 5.0–15.0)

## 2021-09-15 MED ADMIN — NxStage/multiBic RFP 401 (+/- BB) 5000 mL - contains 4 mEq/L of potassium dialysis solution 5,000 mL: 5000 mL | INTRAVENOUS_CENTRAL | @ 02:00:00

## 2021-09-15 MED ADMIN — levoFLOXacin (LEVAQUIN) 500 mg/100 mL IVPB 500 mg: 500 mg | INTRAVENOUS | @ 01:00:00 | Stop: 2021-09-17

## 2021-09-15 MED ADMIN — cefepime (MAXIPIME) 1 g in sodium chloride 0.9 % (NS) 100 mL IVPB-connector bag: 1 g | INTRAVENOUS | @ 01:00:00 | Stop: 2021-09-17

## 2021-09-15 MED ADMIN — white petrolatum-mineral oil (SOOTHE PM) 80-20 % ophthalmic ointment 1 application: 1 | OPHTHALMIC | @ 01:00:00

## 2021-09-15 MED ADMIN — magnesium oxide (MAG-OX) tablet 400 mg: 400 mg | GASTROENTERAL | @ 01:00:00

## 2021-09-15 MED ADMIN — tacrolimus (PROGRAF) oral suspension: 1 mg | GASTROENTERAL | @ 01:00:00

## 2021-09-15 MED ADMIN — heparin (porcine) 5,000 unit/mL injection 5,000 Units: 5000 [IU] | SUBCUTANEOUS | @ 02:00:00

## 2021-09-15 NOTE — Unmapped (Signed)
IMMUNOCOMPROMISED HOST INFECTIOUS DISEASE PROGRESS NOTE    Assessment/Plan:     Miguel Ward is a 70 y.o. 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 COVID tests negative x 3 here. Imaging and severity suggested alternative infection vs. superinfection, and thus patient started on broad-spectrum coverage with vancomycin/cefepime -> cefepime with some improvement.    Found to have legionellosis (Ag and PCR) and started on levofloxacin as well. Respiratory status improved, however progress in hemodynamic and mental status somewhat stagnated bringing up concern for other untreated etiology. CT chest with significant consolidations throughout R lung - appreciate team assistance with bronchoscopy, BAL cx unremarkable. LFTs, particularly alk-phos, have also been increasing despite negative imaging. We recommend continuing levofloxacin for now, extending therapy to at least 10 total days, up to 14 days pending clinical improvement. Given no cx growth can stop other antimicrobials at this time (vanc/cefepime)    ID Problem List:  ESRD 2/2 Lupus nephritis s/p deceased donor kidney transplant 05/2007  - Surgical complications: None  - Serologies: CMV D+/R-  - Immunosuppression: TAC 3/2, MMF 250, pred 5  - Prophylaxis prior to admission: acyclovir (hx HSV)  - Rejection history: Early cellular s/p ATG 2008        Pertinent Co-morbidities  SLE with lupus nephritis  CKDIV  RCC R native kidney s/p nephrectomy 02/2014  Suspected RCC transplanted kidney 12/2020, sp percutaneous cryoablation 06/2021  HFmREF (40-45% 06/2019)  CAD  HTN     Pertinent Exposure History  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 Legionella Ag positive, pt also at risk for other bacterial infection or other pathogens (fungi, PJP, CMV)  - 08/20/2021 found at home minimally responsive, brought to ED, intubated  - 09/11/2021 NP and tracheal specimens negative for SARS-CoV2  - 09/08/21 urine legionella antigen positive. PCR of tracheal aspirate positive  - 09/11/21 CT chest extensive mass-like consolidations with air bronchograms throughout R lung, some in LUL as well  - 09/11/21 Increasing WBC, LFTs (particularly ALP and bilirubin)  - 09/11/21 Bronch/BAL: Negative mycoplasma, chlamydiophila, PJP, EBV, CMV, RPP. Cx with 30K probable CoNS  Tx: Vancomycin/cefepime/remdesivir 9/25 -> added levofloxacin 9/26 -> cefepime/levofloxacin 9/27 -> Vanc/CFP/levofloxacin 9/29    #Increasing ALP and ALT/AST  - 09/03/2021 admission with mildly elevated Tbili/ALP and AST (though with elevated CK)  - 09/10/21 RUQ [place] no abnormalities, no doppler done  - 09/13/21 ALP increasing steadily up to 628. Tbili stabilized, AST/ALT increasing as well  - Along with WBC increase, somewhat concerning for drug reaction. Minimal suspicion CFP or vancomycin, not impossible but rare with levofloxacin    #COVID antigen positive at home 09/05/21  Three negative PCRs while inpatient. Unclear if implies false positive, perhaps resolving infection from weeks prior  - No remdesivir received     Prior infections:  #Fever  and leukocytosis, possible UTI 08/2016  #Liver lesions suspected to be abscesses  - Suspected UTI, improved on empiric broad spectrum therapy Vanc/CFP -> doxy  - CT new liver lesions, FDG avid on PET, concerning for metastases  - Liver biopsy 09/2016 nonspecific inflammatory changes suggesting resolving abscess  - MRI 01/2017 improved liver lesions     #RSV infection 02/2018  - Also diagnosed with decompensated diastolic and systolic HF     Antimicrobial Intolerance/allergy  Penicillins - hives (2014), tolerated cephalosporins       RECOMMENDATIONS    Diagnostic  ?? Please f/u: histo Ag  ?? Follow up repeat Hepatitis B serology - had positive core Ab in 2003  ?? Consider adding on GGT, CK  ?? Follow up final Bcx 9/29  ?? Follow up BAL cx: aerobic/anaerobic, fungal, AFB, Asp Ag, actinomyces  ?? CTM LFTs    Lab monitoring for antimicrobial toxicities  ?? CBC w/ diff and CMP at least weekly    Treatment  Multifocal pneumonia, Legionella antigen positive  ?? Continue Levofloxacin, CRRT dosed equivalent 500 mg daily  ?? Duration TBD, minimum 10 days, up to 14 days (through at least 09/17/21)  ?? STOP vancomycin and cefepime given negative cx data (minimal CoNS but do not suspect is driver of presentation)    Mixed cholestatic/hepatocellular liver injury  ?? Consider holding other potentially hepatotoxic medications, such as statin  ?? If continued worsening of WBC and LFTs, may need to consider alternative therapy to quinolone      Immunosuppression and prophylaxis  ?? IS deferred to transplant teame  ?? Cont ppx with acyclovir           The [facility] ID service will continue to follow.  Please page the ID Transplant/Liquid Oncology Fellow consult at [phone] with questions.  Patient discussed with Dr. Julaine Hua.    Subjective:     Interval History:    History obtained from:patient and nurse.  Events since last encounter: Extubated yesterday afternoon.  Today's HPI for primary problems: Extubated to 3-4 L [place]. Still noted to be drowsy, but interactive and able to work with PT this morning. No pressors needed since extubation. Still on CRRT, minimal urine output. No fevers, no abd pain, no complaints at this time.      Medications:  Antimicrobials:  Anti-infectives (From admission, onward)    Start     Dose/Rate Route Frequency Ordered Stop    09/14/21 1800  vancomycin (VANCOCIN) 750 mg in sodium chloride (NS) 0.9 % 250 mL IVPB (premix)         750 mg  333.3 mL/hr over 45 Minutes Intravenous Every 24 hours 09/13/21 2101 09/16/21 2100    09/08/21 2200  cefepime (MAXIPIME) 1 g in sodium chloride 0.9 % (NS) 100 mL IVPB-connector bag         1 g  200 mL/hr over 30 Minutes Intravenous Every 8 hours scheduled 09/08/21 1501 09/17/21 2359          Prior/Current immunomodulators:  Tacrolimus 3/2  MMF 250 bid  Prednisone 5    Other medications reviewed.    Objective:     Vital Signs last 24 hours:  Temp:  [36.1 ??C (97 ??F)-36.4 ??C (97.6 ??F)] 36.4 ??C (97.5 ??F)  Heart Rate:  [76-113] 106  SpO2 Pulse:  [79-129] 101  Resp:  [10-26] 21  BP: (109-163)/(40-92) 129/50  MAP (mmHg):  [61-102] 75  FiO2 (%):  [30 %] 30 %  SpO2:  [87 %-100 %] 93 %  Physical Exam:   Patient Lines/Drains/Airways Status     Active Active Lines, Drains, & Airways     Name Placement date Placement time Site Days    CVC Triple Lumen 09/13/21 Non-tunneled Left Internal jugular 09/13/21  1848  Internal jugular  1    Hemodialysis Catheter With Distal Infusion Port 09/08/21 Right Internal jugular 09/08/21  1600  Internal jugular  6    NG/OG Tube Feedings Left nostril 09/14/21  1500  Left nostril  less than 1    Urethral Catheter Temperature probe 16 Fr. 2021/09/15  1602  Temperature probe  7    Arteriovenous Fistula - Vein Graft  Access 11/01/18 Right;Upper Arm 11/01/18  --  Arm  1049              Const [x]  vital signs above    []  NAD, non-toxic appearance  Lying comfortably, slightly drowsy      Eyes []  Lids normal bilaterally, conjunctiva anicteric and noninjected OU     [] PERRL  [] EOMI  Swollen lids and conjunctiva but improved. Pupils equal, reactive to light      ENMT [x]  Normal appearance of external nose and ears, no nasal discharge        []  MMM, no lesions on lips or gums []  No thrush, leukoplakia, oral lesions  []  Dentition good []  Edentulous []  Dental caries present  []  Hearing normal  []  TMs with good light reflexes bilaterally   NGT still in place, ETT removed      Neck [x]  Neck of normal appearance and trachea midline        []  No thyromegaly, nodules, or tenderness   []  Full neck ROM  RIJ trialysis catheter in place, no bleeding, erythema. LIJ catheter      Lymph [x]  No LAD in neck     []  No LAD in supraclavicular area     []  No LAD in axillae   []  No LAD in epitrochlear chains     []  No LAD in inguinal areas        CV [x]  RRR            [x]  No peripheral edema     []  Pedal pulses intact   []  No abnormal heart sounds appreciated   []  Extremities WWP         Resp [x]  Normal WOB at rest    []  No breathlessness with speaking, no coughing  []  CTA anteriorly    []  CTA bilaterally    Slight crackles throughout R>L      GI []  Normal inspection, NTND   []  NABS     []  No umbilical hernia on exam       []  No hepatosplenomegaly     []  Inspection of perineal and perianal areas normal  Transplanted kidney palpable. No abdominal pain.      GU []  Normal external genitalia     [] No urinary catheter present in urethra   []  No CVA tenderness    []  No tenderness over renal allograft        MSK []  No clubbing or cyanosis of hands       []  No vertebral point tenderness  []  No focal tenderness or abnormalities on palpation of joints in RUE, LUE, RLE, or LLE        Skin []  No rashes, lesions, or ulcers of visualized skin     []  Skin warm and dry to palpation   RUE with AVG with thrill, dilated  up to shoulder. LUE with graft without thrill           Neuro _ Face expression symmetric  []  Sensation to light touch grossly intact throughout    []  Moves extremities equally    []  No tremor noted        []  CNs II-XII grossly intact     []  DTRs normal and symmetric throughout []  Gait unremarkable  Raspy voice but responds in 1-2 words appropriately, following commands, slightly drowsy, moving all extremities      Psych []  Appropriate affect       []  Fluent speech         []  Attentive, good eye contact  []  Oriented to person, place, time          []  Judgment and insight are appropriate   Answering appropriately          Data for Medical Decision Making     Recent Labs   Lab Units 09/15/21  0743 09/15/21  0334 09/11/21  0805 09/11/21  0430   WBC 10*9/L  --  26.4*   < > 19.9*   HEMOGLOBIN g/dL  --  7.9*   < > 8.8*   PLATELET COUNT (1) 10*9/L  --  74*   < > 72*   NEUTRO ABS 10*9/L  --  24.5*   < > 19.4*   LYMPHO ABS 10*9/L  --  0.9*   < > 0.2*   EOSINO ABS 10*9/L  --  0.1   < > 0.0   BUN mg/dL  --  34*   < > 33*   CREATININE mg/dL  --  1.61*   < > 0.96*   AST U/L  --  290*   < > 179*   ALT U/L  --  163*   < > 40   BILIRUBIN TOTAL mg/dL  --  1.4*   < > 2.4*   ALK PHOS U/L  --  881*   < > 305*   POTASSIUM WHOLE BLOOD mmol/L 4.3  --    < >  --    POTASSIUM mmol/L  --  4.5   < > 4.8   MAGNESIUM mg/dL  --  2.0   < > 2.0   PHOSPHORUS mg/dL  --  2.2*   < > 2.0*   CALCIUM mg/dL  --  8.8   < > 8.5*   CK TOTAL U/L  --   --   --  488.0*    < > = values in this interval not displayed.     I reviewed and noted the following labs: [facility] 26.4, Hgb 7.9, Plt 74 (stable), Cr 1.38 (on crrt), ALP 881 (stable)    Microbiology:    9/25 Bcx pending  9/25 Ucx pending, UA 3 WBC  9/25 Lower resp cx, pending, OPF  9/25 SARS-CoV-2 negative for NP and trach aspirate  9/25 CrAg negative  9/26 RPP negative  9/26 Legionella Ag (urine) positive  9/26 Legionella PCR positive  9/26 CMV PCR negative  9/26 Fungitell negative  9/29 BAL cx OPF, 30K probably CoNS  9/29 BAL AFB smear negative, cx pending  9/29 PJP smear negative    Past cultures were reviewed in Epic and CareEverywhere  Review of cultures with microbiology: I reviewed the specimens with the microbiology team and agree with their assessment.    Imaging:    Liver US 10/2:      IMPRESSION:  -- Patent hepatic vasculature with normal flow  direction. No portal vein thrombus identified.  -- Moderate volume ascites.  -- Small right pleural effusion with associated adjacent atelectasis.     Independent visualization of images: I independently reviewed the image from 09/14/21 and I agree with the findings/interpretation.    Additional Studies:   (09/15/21) EKG [id number]

## 2021-09-15 NOTE — Unmapped (Addendum)
Speech Language Pathology Clinical Swallow Assessment  Evaluation (09/15/21 1100)    Patient Name:  Miguel Ward       Medical Record Number: [id number]   Date of Birth: 07-06-1951  Sex: Male            SLP Treatment Diagnosis: R/o dysphagia  Activity Tolerance: Patient limited by fatigue    Assessment  Pt. was awake, though, lethargic; on tele; on 3L o2 via [place]; in NAD; wife present at bedside when visited this morning in the MICU for clinical swallowing evaluation. Oral mechanism was grossly unremarkable on examination, though, notable for generalized weakness/debility. For trials of thin liquids, pureed solids, and regular solids, Pt. demonstrated mild, intermittent anterior oral loss for thin liquids given difficulty maintaining neutral head posture. Bite/mastication and bolus formation/manipulation/anterior-posterior transit were slow/prolonged, though, grossly functional provided increased oral preparatory time. No clinically significant residuals remained within the oral cavity following swallows. Cough response x1 noted for straw sip of thin liquids, otherwise, no overt s/s aspiration evident for other trials. Pt. appears appropriate at this time for initiation of a PO diet; recommend L6 solids given reduced endurance, advance as tolerated. Recommend thin liquids via single cup sip only. Recommend use of strict aspiration/reflux precautions and oral infection prevention protocol. Pt. is unlikely to demonstrate adequate oral intake to support nutrition/hydration needs at this time; consider monitoring of intake prior to discontinuation of NGT. Pt. will require assistance for feeding at this time. ST to f/u to ensure ongoing diet tolerance and for provision of additional education/intervention as indicated.  Risk for Aspiration: Mild     Recommendations:  Other (Comment) (Consider calorie count prior to discontinuation of NGT)      Diet Liquids Recommendations: No Restrictions, Thin Liquids, Level 0    Diet Solids Recommendation: Soft & Bite-sized, Level 6 (Advance as tolerated)    Recommended Form of Medications: Crushed, With puree (As tolerated/Per RN discretion)      Compensatory Swallowing Strategies: Upright as possible for all oral intake, Remain upright for 20-30 minutes after meals, Full supervision with meals, One to one assist with meals, No straws, Small bites/sips, Eat/feed slowly    Post Acute Discharge Recommendations  Post Acute SLP Discharge Recommendations: 5x weekly, Low intensity    Prognosis: Fair  Positive Indicators: Cooperation; Motivation; Family support  Barriers to Discharge: Age, Functional strength deficits, Severity of deficits, Time post onset, Inability to safely perform ADLS, Other, Endurance deficits (PMH; Current medical status)     Plan of Care  SLP Follow-up / Frequency: 1x per day, 1-2x week  Planned Treatment Duration : 09/29/21    Treatment Goals:  Short Term Goal 1: Pt. will tolerate least restrictive diet without overt s/s aspiration and/or relevant, acute change in respiratory status.   Time Frame : 2 weeks     Planned Interventions: Dysphagia Intervention     Patient and Family Goal: For Pt. to continue to improve medically per wife.    Subjective  Current Functional Status: Miguel Ward is a 70 y.o. male renal transplant from lupus nephritis (complex course over last 15 years since transplant followed here including renal cell) admitted 9/25 with acute hypoxic respiratory failure, COVID 19 and shock with AKI. ST consult was requested for clinical swallowing evaluation s/p extubation 10/2.      Communication Preference: Verbal  Patient/Caregiver Reports: Wife reported Pt.'s baseline diet to be regular/thin; no pre-morbid dysphagia.     Allergies: Penicillins  Current Facility-Administered Medications   Medication Dose Route  Frequency Provider Last Rate Last Admin   ??? aspirin chewable tablet 81 mg  81 mg Enteral tube: gastric  Daily Amy Ladon Applebaum, PA   81 mg at 09/15/21 0931 ??? calcitRIOL (ROCALTROL) oral solution  0.25 mcg Enteral tube: gastric  Q MWF Zannie Cove, MD   0.25 mcg at 09/15/21 0932   ??? chlorhexidine (PERIDEX) 0.12 % solution 5 mL  5 mL Mouth BID Amy Ross Vota, PA   5 mL at 09/15/21 4034   ??? dextrose (D10W) 10% bolus 125 mL  12.5 g Intravenous Q10 Min PRN Honey Lynnae January, ACNP   125 mL at 09/08/21 0521   ??? esomeprazole (NexIUM) granules 40 mg  40 mg Enteral tube: gastric  daily Amy Ladon Applebaum, PA   40 mg at 09/15/21 0510   ??? heparin (porcine) 1000 unit/mL injection 2,000 Units  2 mL Intra-cannular Each time in dialysis PRN Elmer Picker, MD   1,400 Units at 09/11/21 1037   ??? heparin (porcine) 1000 unit/mL injection 2,000 Units  2 mL Intra-cannular Each time in dialysis PRN Elmer Picker, MD   1,400 Units at 09/11/21 1036   ??? heparin (porcine) 5,000 unit/mL injection 5,000 Units  5,000 Units Subcutaneous [facility] Qurratul-Ayn Dibble, PA   5,000 Units at 09/15/21 0510   ??? influenza vaccine quad (FLUARIX, FLULAVAL, FLUZONE) (6 MOS & UP) 2022-23  0.5 mL Intramuscular During hospitalization Rayetta Pigg, MD       ??? levoFLOXacin [facility] tablet 500 mg  500 mg Enteral tube: gastric  Daily Hyman Hopes, ACNP   500 mg at 09/15/21 0931   ??? levothyroxine (SYNTHROID) tablet 250 mcg  250 mcg Enteral tube: gastric  daily Honey Lynnae January, ACNP   250 mcg at 09/15/21 0510   ??? magnesium oxide (MAG-OX) tablet 400 mg  400 mg Enteral tube: gastric  BID Amy Ross Vota, PA   400 mg at 09/15/21 0931   ??? norepinephrine 8 mg in dextrose 5 % 250 mL (32 mcg/mL) infusion PMB  0-30 mcg/min Intravenous Continuous Rosie Fate, DO   Paused at 09/14/21 1615   ??? NxStage RFP 400 (+/- BB) 5000 mL - contains 2 mEq/L of potassium dialysis solution 5,000 mL  5,000 mL CRRT Continuous Elmer Picker, MD       ??? NxStage/multiBic RFP 401 (+/- BB) 5000 mL - contains 4 mEq/L of potassium dialysis solution 5,000 mL  5,000 mL CRRT Continuous Elmer Picker, MD   5,000 mL at 09/15/21 0957   ??? ondansetron (ZOFRAN) injection 4 mg  4 mg Intravenous Q6H PRN Amy Ladon Applebaum, PA       ??? predniSONE (DELTASONE) tablet 15 mg  15 mg Oral Daily Qurratul-Ayn Dibble, PA   15 mg at 09/15/21 0931   ??? sodium phosphate 15 mmol in dextrose 5 % 250 mL IVPB  15 mmol Intravenous Once Hyman Hopes, ACNP 46.7 mL/hr at 09/15/21 0943 15 mmol at 09/15/21 0943   ??? tacrolimus (PROGRAF) oral suspension  1 mg Enteral tube: gastric  BID Zannie Cove, MD   1 mg at 09/15/21 0900   ??? white petrolatum-mineral oil (SOOTHE PM) 80-20 % ophthalmic ointment 1 application  1 application Both Eyes BID Honey Monet Gildardo Cranker, ACNP   1 application at 09/15/21 7425     Past Medical History:   Diagnosis Date   ??? Anemia    ??? CHF (congestive heart failure) (CMS-HCC)    ???  Coronary artery disease    ??? Disease of thyroid gland    ??? GERD (gastroesophageal reflux disease)    ??? Gout    ??? Hyperlipidemia    ??? Hypertension    ??? Lupus nephritis (CMS-HCC)    ??? NSTEMI (non-ST elevated myocardial infarction) (CMS-HCC)    ??? Red blood cell antibody positive     anti-K   ??? Renal cancer, right (CMS-HCC)    ??? Renal transplant, status post      Family History   Problem Relation Age of Onset   ??? Heart disease Mother    ??? Diabetes Mother    ??? Hypertension Mother    ??? Cancer Father    ??? Alcohol abuse Brother    ??? Cardiomyopathy Neg Hx    ??? Sudden Cardiac Death Neg Hx    ??? Melanoma Neg Hx    ??? Basal cell carcinoma Neg Hx    ??? Squamous cell carcinoma Neg Hx    ??? Anesthesia problems Neg Hx      Past Surgical History:   Procedure Laterality Date   ??? AV FISTULA PLACEMENT Right    ??? HERNIA REPAIR     ??? PR BREATH HYDROGEN TEST N/A 06/24/2016    Procedure: BREATH HYDROGEN TEST;  Surgeon: Nurse-Based Giproc;  [facility];  Service: Gastroenterology   ??? PR COLONOSCOPY FLX DX W/COLLJ SPEC WHEN PFRMD N/A 05/22/2016    Procedure: COLONOSCOPY, FLEXIBLE, PROXIMAL TO SPLENIC FLEXURE; DIAGNOSTIC, W/WO COLLECTION SPECIMEN BY BRUSH OR WASH; Surgeon: Liane Comber, MD;  Location: [facility]ED[facility];  Service: Gastroenterology   ??? PR COLSC FLX W/RMVL OF TUMOR POLYP LESION SNARE TQ N/A 02/04/2021    Procedure: COLONOSCOPY FLEX; W/REMOV TUMOR/LES BY SNARE;  Surgeon: Rona Ravens, MD;  Location: GI PROCEDURES [facility];  Service: Gastroenterology   ??? PR LIGATN ANGIOACCESS AV FISTULA Right 10/21/2018    Procedure: LIGATION OR BANDING OF ANGIOACCESS ARTERIOVENOUS FISTULA, UPPER EXTREMITY;  Surgeon: Leona Carry, MD;  Location: MAIN OR [facility];  Service: Transplant   ??? PR NEPHRECTOMY Right 02/20/2014    Procedure: LAPAROSCOPY, SURGICAL; NEPHRECTOMY, INCLUDING PARTIAL URETERECTOMY;  Surgeon: Vivi Barrack, MD;  Location: MAIN OR [facility];  Service: Transplant   ??? PR REVISE AV FISTULA,W/O THROMBECTOMY Right 02/07/2019    Procedure: REVISON, OPEN, ARTERIOVENOUS FISTULA; WITHOUT THROMBECTOMY, AUTOGENOUS OR NONAUTOGENOUS DIALYSIS GRAFT;  Surgeon: Leona Carry, MD;  Location: MAIN OR [facility];  Service: Transplant   ??? TRANSPLANTATION RENAL       Social History     Tobacco Use   ??? Smoking status: Never Smoker   ??? Smokeless tobacco: Never Used   Substance Use Topics   ??? Alcohol use: No     Alcohol/week: 0.0 standard drinks     General:  Hearing Exceptions: None                 Vision:  (Unable to determine)                   Medical Tests / Procedures Comments: CXR 09/13/21: Interval placement of left IJ approach central venous catheter with tip terminating at the confluence of brachiocephalic veins. Otherwise, no significant interval changes when compared to prior CT chest dated 09/11/2021.  Equipment/Environment: NGT, Telemetry, Supplemental oxygen, Caregiver wearing mask for full session, Patient not wearing mask for full session, Other (SLP wearing mask for full session)  Services patient receives: OT, PT, SLP    Objective  Temperature Spikes  Noted: No  Respiratory Status : O2 via nasal cannula (3 LPM)  History of Intubation: Yes  Length of Intubations (days): 7 days  Date extubated: 09/14/21    Behavior/Cognition: Cooperative, Administrator, [employer], Requires cueing  Positioning : Upright in bed    Oral / Motor Exam  Vocal Quality: Wet  Volitional Swallow: Delayed   Labial ROM: Within Functional Limits   Labial Symmetry: Within Functional Limits  Labial Strength: Reduced   Lingual ROM: Within Functional Limits  Lingual Symmetry: Within Functional Limits  Lingual Strength: Reduced   Velum: elevation grossly WNL   Mandible: Within Functional Limits  Coordination: WFL  Facial ROM: Within Functional Limits   Facial Symmetry: Within Functional Limits  Facial Strength: Reduced  Vocal Intensity: Moderately decreased   Gag: Unable to assess   Apraxia: None present   Dysarthria: None present   Intelligibility: Intelligibility reduced   Breath Support: Other (Comment) (Variable)   Dentition: Adequate    Consistencies assessed: Ice chips, thin liquids via cup and straw sips, pureed solids via 1/2 and full tsp. trials, regular solids via small bites    Medical Staff Made Aware: RN, NP    Speech Therapy Session Duration  SLP Individual [mins]: 20    I attest that I have reviewed the above information.  Signed: Valeta Harms, SLP    Filed 09/15/2021

## 2021-09-15 NOTE — Unmapped (Signed)
Pt extubated this shift to 5LNC, strong cough, large volume of secretions. Continues to follow commands, remains drowsy, RASS -1. Hemodynamically stable, pressor weaned off as tolerated. CRRT running without incident. NGT placed.    Regular visitors at bedside throughout this shift.   Problem: Fall Injury Risk  Goal: Absence of Fall and Fall-Related Injury  Outcome: Ongoing - Unchanged  Intervention: Promote Injury-Free Environment  Recent Flowsheet Documentation  Taken 09/14/2021 0800 by Maryruth Eve, RN  Safety Interventions: aspiration precautions     Problem: Infection  Goal: Absence of Infection Signs and Symptoms  Outcome: Ongoing - Unchanged  Intervention: Prevent or Manage Infection  Recent Flowsheet Documentation  Taken 09/14/2021 0800 by Maryruth Eve, RN  Infection Management: aseptic technique maintained     Problem: Adult Inpatient Plan of Care  Goal: Plan of Care Review  Outcome: Ongoing - Unchanged  Goal: Patient-Specific Goal (Individualized)  Outcome: Ongoing - Unchanged  Goal: Absence of Hospital-Acquired Illness or Injury  Outcome: Ongoing - Unchanged  Intervention: Identify and Manage Fall Risk  Recent Flowsheet Documentation  Taken 09/14/2021 0800 by Maryruth Eve, RN  Safety Interventions: aspiration precautions  Intervention: Prevent Skin Injury  Recent Flowsheet Documentation  Taken 09/14/2021 0800 by Maryruth Eve, RN  Skin Protection:   adhesive use limited   cleansing with dimethicone incontinence wipes  Intervention: Prevent and Manage VTE (Venous Thromboembolism) Risk  Recent Flowsheet Documentation  Taken 09/14/2021 0800 by Maryruth Eve, RN  VTE Prevention/Management: anticoagulant therapy  Intervention: Prevent Infection  Recent Flowsheet Documentation  Taken 09/14/2021 0800 by Maryruth Eve, RN  Infection Prevention: personal protective equipment utilized  Goal: Optimal Comfort and Wellbeing  Outcome: Ongoing - Unchanged  Goal: Readiness for Transition of Care  Outcome: Ongoing - Unchanged  Goal: Rounds/Family Conference  Outcome: Ongoing - Unchanged     Problem: Skin Injury Risk Increased  Goal: Skin Health and Integrity  Outcome: Ongoing - Unchanged  Intervention: Optimize Skin Protection  Recent Flowsheet Documentation  Taken 09/14/2021 0800 by Maryruth Eve, RN  Pressure Reduction Techniques:   frequent weight shift encouraged   weight shift assistance provided  Pressure Reduction Devices: pressure-redistributing mattress utilized  Skin Protection:   adhesive use limited   cleansing with dimethicone incontinence wipes     Problem: Dysrhythmia (Heart Failure)  Goal: Stable Heart Rate and Rhythm  Outcome: Ongoing - Unchanged     Problem: Fluid Imbalance (Heart Failure)  Goal: Fluid Balance  Outcome: Ongoing - Unchanged     Problem: Respiratory Compromise (Heart Failure)  Goal: Effective Oxygenation and Ventilation  Outcome: Ongoing - Unchanged     Problem: Non-Violent Restraints  Goal: Patient will remain free of restraint events  Outcome: Ongoing - Unchanged  Goal: Patient will remain free of physical injury  Outcome: Ongoing - Unchanged     Problem: Impaired Wound Healing  Goal: Optimal Wound Healing  Outcome: Ongoing - Unchanged     Problem: Self-Care Deficit  Goal: Improved Ability to Complete Activities of Daily Living  Outcome: Ongoing - Unchanged     Problem: Diabetes Comorbidity  Goal: Blood Glucose Level Within Targeted Range  Outcome: Ongoing - Unchanged     Problem: Heart Failure Comorbidity  Goal: Maintenance of Heart Failure Symptom Control  Outcome: Ongoing - Unchanged     Problem: Hypertension Comorbidity  Goal: Blood Pressure in Desired Range  Outcome: Ongoing - Unchanged

## 2021-09-15 NOTE — Unmapped (Signed)
PHYSICAL THERAPY  Evaluation (09/15/21 0809)          Patient Name:?? Miguel Ward????????   Medical Record Number: [id number]   Date of Birth: 1951/06/25  Sex: Male??  ??    Treatment Diagnosis: impaired mobility 2/2 decreased activity tolerance, ROM deficits, weakness     Activity Tolerance:  (limited by variable desaturation despite increased O2 support)     ASSESSMENT  Problem List: Decreased endurance, Decreased mobility, Decreased range of motion, Decreased strength, Fall Risk, Impaired ADLs, Impaired balance, Postural Weakness, Core weakness, Decreased cognition, Joint restriction        Miguel Ward is a 70 y.o. male with HTN, 2009 renal transplant (no longer on dialysis), CAD, HLD, HFrEF (40-45% 06/2019), NSTEMI, and lupus nephritis??presented 9/25 for??AMS.??The patient tested positive for COVID 9/23. Per family he had been symptomatic since 9/22 and had not been taking home meds. When EMS arrived, he was fully unresponsive,??and hypoglycemic.????He was given 250 mL of D10, and became semi unconscious. He still only responded to pain and verbal, albeit barely. EMS notes that he was initially hypotensive to the 90s so they began??Levophed, which brought his blood pressure to 135/102. He had been satting 87% on 10-15L and was in sinus tachycardia at 135.??Upon arrival to ED he was intubated for airway protection and hypoxia. CXR c/f covid pna +/- superimposed bact infection in an immunocomp host. He was started oon BSA, levo and sedation and transferred to MICU for further mngmt. Patient is now extubated and seen for PT consult at the request of the MDI team for early mobility.  Miguel Ward presents to PT with deficits noted above (see problem list) impacting all aspects of functional mobility this date.  He is able to engage in bed mobility, therex and EOB sitting x ~ 9 minutes with variable support for postural adjustments  Additionally demonstrates variable desaturation requiring increased O2 to address.  Expect ongoing benefit from skilled PT in acute and post acute setting to address deficits and maximize functional recovery.  After review of contributing co-morbidities and personal factors, clinical presentation and exam findings, patient demonstrates moderate complexity for evaluation and development of plan of care.     Today's Interventions: Eval- bed mobility, assisted ROM, positioning, sitting tolerance for postural engagement, initiation of LE strengthening / AROM EOB, postural adjustment , reorientation, patient education re: role of PT, POC, vital sign monitoring and modification of therapeutic activites per tolerance     Personal Factors/Comorbidities Present: 3+ (HTN, CAD, NSTEMI, COVID, lupus)       Examination of Body System: 1-3 elements   Body System: MSK, CP   Clinical Decision Making: Moderate      PLAN  Planned Frequency of Treatment:?? 1-2x per day for: 4-5x week       Planned Interventions: Endurance activities, Balance activities, Education - Patient, Functional mobility, Home exercise program, Therapeutic exercise, Therapeutic activity, Transfer training     Post-Discharge Physical Therapy Recommendations:?? 5x weekly, Low intensity     PT DME Recommendations: Defer to post acute??????????       Goals:   Patient and Family Goals: did not state     Long Term Goal #1: pending OOB mobility assessment        SHORT GOAL #1: Patient will demonstrate rolling with min assist x 1  ?????????????????????? Time Frame : 2 weeks  SHORT GOAL #2: Patient will transitino to / from EOB with min assist x 2  ?????????????????????? Time Frame : 2 weeks  SHORT  GOAL #3: Patient will engage in OOB mobility assessment  ?????????????????????? Time Frame : 2 weeks   ??????????       Prognosis:?? Fair  Positive Indicators: PLOF, participation  Barriers to Discharge: Functional strength deficits, Impaired Balance, Inability to safely perform ADLS, Endurance deficits, Decreased range of motion, Severity of deficits     SUBJECTIVE  Patient reports: consents to PT  Current Functional Status: patient received in bed, session completion same - RN in to assess and assist with positioning, O2 requirement and airway clearance needs     Prior Functional Status: per patient / CCM notes, patient independent prior to admission, preacher  Equipment available at home: (P) None (Confirmation needed)      Past Medical History:   Diagnosis Date   ??? Anemia    ??? CHF (congestive heart failure) (CMS-HCC)    ??? Coronary artery disease    ??? Disease of thyroid gland    ??? GERD (gastroesophageal reflux disease)    ??? Gout    ??? Hyperlipidemia    ??? Hypertension    ??? Lupus nephritis (CMS-HCC)    ??? NSTEMI (non-ST elevated myocardial infarction) (CMS-HCC)    ??? Red blood cell antibody positive     anti-K   ??? Renal cancer, right (CMS-HCC)    ??? Renal transplant, status post             Social History     Tobacco Use   ??? Smoking status: Never Smoker   ??? Smokeless tobacco: Never Used   Substance Use Topics   ??? Alcohol use: No     Alcohol/week: 0.0 standard drinks       Past Surgical History:   Procedure Laterality Date   ??? AV FISTULA PLACEMENT Right    ??? HERNIA REPAIR     ??? PR BREATH HYDROGEN TEST N/A 06/24/2016    Procedure: BREATH HYDROGEN TEST;  Surgeon: Nurse-Based Giproc;  [facility];  Service: Gastroenterology   ??? PR COLONOSCOPY FLX DX W/COLLJ SPEC WHEN PFRMD N/A 05/22/2016    Procedure: COLONOSCOPY, FLEXIBLE, PROXIMAL TO SPLENIC FLEXURE; DIAGNOSTIC, W/WO COLLECTION SPECIMEN BY BRUSH OR WASH;  Surgeon: Liane Comber, MD;  Location: [facility];  Service: Gastroenterology   ??? PR COLSC FLX W/RMVL OF TUMOR POLYP LESION SNARE TQ N/A 02/04/2021    Procedure: COLONOSCOPY FLEX; W/REMOV TUMOR/LES BY SNARE;  Surgeon: Rona Ravens, MD;  Location: GI PROCEDURES [facility];  Service: Gastroenterology   ??? PR LIGATN ANGIOACCESS AV FISTULA Right 10/21/2018    Procedure: LIGATION OR BANDING OF ANGIOACCESS ARTERIOVENOUS FISTULA, UPPER EXTREMITY;  Surgeon: Leona Carry, MD; Location: MAIN OR [facility];  Service: Transplant   ??? PR NEPHRECTOMY Right 02/20/2014    Procedure: LAPAROSCOPY, SURGICAL; NEPHRECTOMY, INCLUDING PARTIAL URETERECTOMY;  Surgeon: Vivi Barrack, MD;  Location: MAIN OR [facility];  Service: Transplant   ??? PR REVISE AV FISTULA,W/O THROMBECTOMY Right 02/07/2019    Procedure: REVISON, OPEN, ARTERIOVENOUS FISTULA; WITHOUT THROMBECTOMY, AUTOGENOUS OR NONAUTOGENOUS DIALYSIS GRAFT;  Surgeon: Leona Carry, MD;  Location: MA[facility] OR [facility];  Service: Transplant   ??? TRANSPLANTATION RENAL               Family History   Problem Relation Age of Onset   ??? Heart disease Mother    ??? Diabetes Mother    ??? Hypertension Mother    ??? Cancer Father    ??? Alcohol abuse Brother    ??? Cardiomyopathy Neg Hx    ???  Sudden Cardiac Death Neg Hx    ??? Melanoma Neg Hx    ??? Basal cell carcinoma Neg Hx    ??? Squamous cell carcinoma Neg Hx    ??? Anesthesia problems Neg Hx         Allergies: Penicillins          Objective Findings  Precautions / Restrictions  Precautions: (P) Falls precautions  Weight Bearing Status: (P) Non-applicable  Required Braces or Orthoses: (P) Non-applicable     Communication Preference: Verbal          Pain Comments: denies pain  Medical Tests / Procedures: review of imaging, orders, consults, labs  Equipment / Environment: (P) Foley, CRRT, Supplemental oxygen, Telemetry, Vascular access (PIV, TLC, Port-a-cath, PICC), Patient not wearing mask for full session, NGT     At Rest: HR 112, O2 sat 93% on 3L [place], BP 136/72  With Activity: HR 106-133 bpm, O2 sat 83-96% on 3-4-6L, transitioned to NRB 2/2 sustained with return to supine- RN in to assist- able to transition back to 4L [place] with positioning, bed percussion  Orthostatics: BP stable throughout, patient asymptomatic  Airway Clearance: mobility, DB/ C, bed percussion     Living Situation  Living Environment: House  Lives With: (P) Spouse (Per previous evaluation)  Home Living: (P) One level home, Stairs to enter with rails, Tub/shower unit, Hand-held shower hose (Per previous evaluation)  Rail placement (outside): (P) Rail on right side  Number of Stairs to Enter (outside): (P) 8 (Per previous evaluation)      Cognition: Impaired/Limited  Cognition comment: arousable, oriented to self, [facility], October, 2023-self corrects to 2022, able to follow simple commands consistently  Visual/Perception: Wears Glasses/Contacts  Visual/Perception comment: glasses applied during session     Skin Inspection: Swelling  Skin Inspection comment: B feet     Upper Extremities  UE ROM: Right Impaired/Limited, Left Impaired/Limited  RUE ROM Impairment: Limited AROM, Limited PROM, Reduced coordination  LUE ROM Impairment: Limited AROM, Limited PROM, Reduced coordination  UE Strength: Right Impaired/Limited, Left Impaired/Limited  RUE Strength Impairment: Reduced strength, Reduced coordination  LUE Strength Impairment: Reduced strength, Reduced coordination    Lower Extremities  LE ROM: Right Impaired/Limited, Left Impaired/Limited  RLE ROM Impairment: Limited AROM  LLE ROM Impairment: Limited AROM  LE Strength: Right Impaired/Limited, Left Impaired/Limited  RLE Strength Impairment: Reduced strength  LLE Strength Impairment: Reduced strength     Balance: Sitting balance (needs UE support), Impaired dynamic sitting balance, Impaired static sitting balance  Balance comment: min-mod assist x 1/2-patient able to perform small self corrections and postural shifts @ EOB.  Intermittent use of B UE  Posture comment: forward head-requires assist to optimize      Bed Mobility: Supine to Sit  Supine to Sit assistance level: Maximum assist, patient does 25-49%  Bed Mobility: second person min assist x 1     Transfers: Sit to Stand  Sit to Stand assistance level: Other (Comment)  Transfer comments: deferred - not yet ready      Gait: n/a     Stairs: n/a          Endurance: impaired - patient demonstrating variable desaturation with EOB sitting, improves with transitino to 4-6L [place], improves with cues for DB, again demonstrates desaturation with return to supine requiring NRB for recovery, RN in to assist / assess     Physical Therapy Session Duration  PT Co-Treatment [mins]: 37  Reason for Co-treatment: To safely progress mobility, Poor activity tolerance  Medical Staff Made Aware: RN Malachi Bonds     I attest that I have reviewed the above information.  Signed: Madlyn Frankel Hally Colella, PT  Filed 16/12/958

## 2021-09-15 NOTE — Unmapped (Signed)
Pt remains drowsy and oriented to self. VSS, remained off pressors. CRRT continues, UF 100. Tube feeds restarted. Uneventful shift.       Problem: Fall Injury Risk  Goal: Absence of Fall and Fall-Related Injury  Outcome: Ongoing - Unchanged     Problem: Infection  Goal: Absence of Infection Signs and Symptoms  Outcome: Ongoing - Unchanged     Problem: Adult Inpatient Plan of Care  Goal: Plan of Care Review  Outcome: Ongoing - Unchanged  Goal: Patient-Specific Goal (Individualized)  Outcome: Ongoing - Unchanged  Goal: Absence of Hospital-Acquired Illness or Injury  Outcome: Ongoing - Unchanged  Intervention: Prevent and Manage VTE (Venous Thromboembolism) Risk  Recent Flowsheet Documentation  Taken 09/15/2021 0000 by Estanislado Pandy, RN  Activity Management: bedrest  Taken 09/14/2021 2200 by Estanislado Pandy, RN  Activity Management: bedrest  Taken 09/14/2021 2000 by Alena Bills Tressy Kunzman, RN  Activity Management: bedrest  Goal: Optimal Comfort and Wellbeing  Outcome: Ongoing - Unchanged  Goal: Readiness for Transition of Care  Outcome: Ongoing - Unchanged  Goal: Rounds/Family Conference  Outcome: Ongoing - Unchanged     Problem: Skin Injury Risk Increased  Goal: Skin Health and Integrity  Outcome: Ongoing - Unchanged  Intervention: Optimize Skin Protection  Recent Flowsheet Documentation  Taken 09/15/2021 0000 by Alena Bills Delon Revelo, RN  Pressure Reduction Techniques: frequent weight shift encouraged  Head of Bed (HOB) Positioning: HOB elevated  Pressure Reduction Devices: pressure-redistributing mattress utilized  Taken 09/14/2021 2200 by Alena Bills Mekhai Venuto, RN  Pressure Reduction Techniques: frequent weight shift encouraged  Head of Bed (HOB) Positioning: HOB elevated  Pressure Reduction Devices: pressure-redistributing mattress utilized  Taken 09/14/2021 2000 by Alena Bills Kaydi Kley, RN  Pressure Reduction Techniques: frequent weight shift encouraged  Head of Bed (HOB) Positioning: HOB elevated  Pressure Reduction Devices: pressure-redistributing mattress utilized     Problem: Dysrhythmia (Heart Failure)  Goal: Stable Heart Rate and Rhythm  Outcome: Ongoing - Unchanged     Problem: Fluid Imbalance (Heart Failure)  Goal: Fluid Balance  Outcome: Ongoing - Unchanged     Problem: Respiratory Compromise (Heart Failure)  Goal: Effective Oxygenation and Ventilation  Outcome: Ongoing - Unchanged     Problem: Nutrition Impairment (Mechanical Ventilation, Invasive)  Goal: Optimal Nutrition Delivery  Outcome: Ongoing - Unchanged     Problem: Impaired Wound Healing  Goal: Optimal Wound Healing  Outcome: Ongoing - Unchanged  Intervention: Promote Wound Healing  Recent Flowsheet Documentation  Taken 09/15/2021 0000 by Estanislado Pandy, RN  Activity Management: bedrest  Taken 09/14/2021 2200 by Estanislado Pandy, RN  Activity Management: bedrest  Taken 09/14/2021 2000 by Alena Bills Keona Bilyeu, RN  Activity Management: bedrest     Problem: Self-Care Deficit  Goal: Improved Ability to Complete Activities of Daily Living  Outcome: Ongoing - Unchanged     Problem: Diabetes Comorbidity  Goal: Blood Glucose Level Within Targeted Range  Outcome: Ongoing - Unchanged     Problem: Heart Failure Comorbidity  Goal: Maintenance of Heart Failure Symptom Control  Outcome: Ongoing - Unchanged     Problem: Hypertension Comorbidity  Goal: Blood Pressure in Desired Range  Outcome: Ongoing - Unchanged

## 2021-09-15 NOTE — Unmapped (Signed)
OCCUPATIONAL THERAPY  Evaluation (09/15/21 0812)    Patient Name:  Miguel Ward       Medical Record Number: [id number]   Date of Birth: November 14, 1951  Sex: Male          OT Treatment Diagnosis:  Decreased ADLs, functional mobility, and activity tolerance    Assessment  Problem List: Impaired ADLs, Decreased coordination, Decreased endurance, Decreased mobility, Decreased range of motion, Decreased strength, Fall Risk, Impaired balance  Assessment: Miguel Ward is a 70 y.o. male with HTN, 2009 renal transplant (no longer on dialysis), CAD, HLD, HFrEF (40-45% 06/2019), NSTEMI, and lupus nephritis presented 9/25 for AMS. The patient tested positive for COVID 9/23. Per family he had been symptomatic since 9/22 and had not been taking home meds. When EMS arrived, he was fully unresponsive, and hypoglycemic. At baseline per previous evaluation, patient was independent in ADLs. Patient now presents with an AMPAC score of 10/24 indicating a 74.70% impairment in self care, a decrease in balance, functional mobility, functional transfers, strength, activity tolerance, cognition and coordination and deficits in all ADLs. The pt was motivated and willing for therapy today. The pt was able to sit EOB, but was limited in further ADLs or mobility secondary to increased oxygen requirement, RN aware. The pt would benefit from skilled acute and post-acute OT services to maximize independence and safety. Based on pt's occupational profile, assessment review, level of clinical decision making involved, and intervention plan, this evaluation is considered to be a moderate complexity case.  Today's Interventions: OT evaluation. Active and passive ROM of bilateral UEs, bed mobility supine to sit EOB, sitting balance, sit to supine, repositioning in bed. Educated pt on OT, POC, and safety.    Activity Tolerance During Today's Session  Tolerated treatment well    Plan  Planned Frequency of Treatment:  1-2x per day for: 3-4x week  Planned Treatment Duration: 09/29/21    Planned Interventions:  Adaptive equipment, ADL retraining, Balance activities, Bed mobility, Compensatory tech. training, Conservation, Education - Patient, Education - Family / caregiver, Endurance activities, Functional cognition, Functional mobility, Home exercise program, Neuromuscular re-education, Passive range of motion, Range of motion, Safety education, Therapeutic exercise, Transfer training, UE Strength / coordination exercise    Post-Discharge Occupational Therapy Recommendations:   5x weekly, Low intensity   OT DME Recommendations: Defer to post acute -        GOALS:   Patient and Family Goals: To maximize independence.    IP Long Term Goal #1: Pt will score an 18/24 on the AMPAC in 10 weeks.       Short Term:  Pt will participate in an OOB assessment.   Time Frame : 2 weeks  Pt will complete UB dressing with min A.   Time Frame : 2 weeks  Pt will perform sitting EOB grooming tasks for 5+ minutes with min A.   Time Frame : 2 weeks                  Prognosis:  Fair  Positive Indicators:  Supportive family, motivation  Barriers to Discharge: Age, Functional strength deficits, Severity of deficits, Time post onset, Inability to safely perform ADLS, Other, Endurance deficits (PMH; Current medical status)    Subjective  Current Status Pt received and left semi-reclined in bed with all immediate needs met, vital signs stable, call bell within reach, RN present and aware  Prior Functional Status Per previous evaluation, the pt was independent with ADLs. The pt works as  a Education officer, environmental. (Confirmation needed)    Medical Tests / Procedures: Reviewed in EPIC  Services patient receives: OT, PT, SLP    Patient / Caregiver reports: It's October.    Past Medical History:   Diagnosis Date   ??? Anemia    ??? CHF (congestive heart failure) (CMS-HCC)    ??? Coronary artery disease    ??? Disease of thyroid gland    ??? GERD (gastroesophageal reflux disease)    ??? Gout    ??? Hyperlipidemia ??? Hypertension    ??? Lupus nephritis (CMS-HCC)    ??? NSTEMI (non-ST elevated myocardial infarction) (CMS-HCC)    ??? Red blood cell antibody positive     anti-K   ??? Renal cancer, right (CMS-HCC)    ??? Renal transplant, status post     Social History     Tobacco Use   ??? Smoking status: Never Smoker   ??? Smokeless tobacco: Never Used   Substance Use Topics   ??? Alcohol use: No     Alcohol/week: 0.0 standard drinks      Past Surgical History:   Procedure Laterality Date   ??? AV FISTULA PLACEMENT Right    ??? HERNIA REPAIR     ??? PR BREATH HYDROGEN TEST N/A 06/24/2016    Procedure: BREATH HYDROGEN TEST;  Surgeon: Nurse-Based Giproc;  [facility];  Service: Gastroenterology   ??? PR COLONOSCOPY FLX DX W/COLLJ SPEC WHEN PFRMD N/A 05/22/2016    Procedure: COLONOSCOPY, FLEXIBLE, PROXIMAL TO SPLENIC FLEXURE; DIAGNOSTIC, W/WO COLLECTION SPECIMEN BY BRUSH OR WASH;  Surgeon: Liane Comber, MD;  Location: [facility];  Service: Gastroenterology   ??? PR COLSC FLX W/RMVL OF TUMOR POLYP LESION SNARE TQ N/A 02/04/2021    Procedure: COLONOSCOPY FLEX; W/REMOV TUMOR/LES BY SNARE;  Surgeon: Rona Ravens, MD;  Location: GI PROCEDURES [facility];  Service: Gastroenterology   ??? PR LIGATN ANGIOACCESS AV FISTULA Right 10/21/2018    Procedure: LIGATION OR BANDING OF ANGIOACCESS ARTERIOVENOUS FISTULA, UPPER EXTREMITY;  Surgeon: Leona Carry, MD;  Location: MAIN OR [facility];  Service: Transplant   ??? PR NEPHRECTOMY Right 02/20/2014    Procedure: LAPAROSCOPY, SURGICAL; NEPHRECTOMY, INCLUDING PARTIAL URETERECTOMY;  Surgeon: Vivi Barrack, MD;  Location: MAIN OR [facility];  Service: Transplant   ??? PR REVISE AV FISTULA,W/O THROMBECTOMY Right 02/07/2019    Procedure: REVISON, OPEN, ARTERIOVENOUS FISTULA; WITHOUT THROMBECTOMY, AUTOGENOUS OR NONAUTOGENOUS DIALYSIS GRAFT;  Surgeon: Leona Carry, MD;  Location: MAIN OR [facility];  Service: Transplant   ??? TRANSPLANTATION RENAL      Family History   Problem Relation Age of Onset   ??? Heart disease Mother    ??? Diabetes Mother    ??? Hypertension Mother    ??? Cancer Father    ??? Alcohol abuse Brother    ??? Cardiomyopathy Neg Hx    ??? Sudden Cardiac Death Neg Hx    ??? Melanoma Neg Hx    ??? Basal cell carcinoma Neg Hx    ??? Squamous cell carcinoma Neg Hx    ??? Anesthesia problems Neg Hx         Penicillins     Objective Findings  Precautions / Restrictions  Aspiration precautions, HOB elevated    Weight Bearing  Non-applicable    Required Braces or Orthoses  Non-applicable    Communication Preference  Verbal    Pain  Denies pain    Equipment / Environment  Foley, CRRT, Supplemental oxygen, Telemetry, Vascular access (PIV, TLC, Port-a-cath, PICC), Patient  not wearing mask for full session, NGT    Living Situation  Living Environment: House  Lives With: Spouse (Per previous evaluation)  Home Living: One level home, Stairs to enter with rails, Tub/shower unit, Hand-held shower hose (Per previous evaluation)  Rail placement (outside): Rail on right side  Number of Stairs to Enter (outside): 8 (Per previous evaluation)  Equipment available at home: None (Confirmation needed)     Cognition   Orientation Level:  Disoriented to time, Disoriented to situation (Disoriented to year, correctly identified month.)   Arousal/Alertness:  Appropriate responses to stimuli   Attention Span:  Attends with cues to redirect   Memory:  Unable to assess   Following Commands:  Follows one step commands with increased time   Safety Judgment:  Good awareness of safety precautions   Awareness of Errors:  Good awareness of safety precautions   Problem Solving:  Unable to assess   Comments:      Vision / Hearing   Vision: No deficits identified, Wears glasses all the time, Glasses present     Hearing: No deficit identified         Hand Function:  Right Hand Function: Right hand function impaired  Right Hand Impairment: grip strength poor (Flexion contractions of MCP joints of index through small finger and PIP joint of small finger.)  Left Hand Function: Left hand function impaired  Left Hand Impairment: grip strength poor (PROM WFL)  Hand Dominance: Right    Skin Inspection:  Skin Inspection: Intact where visualized    ROM / Strength:  UE ROM/Strength: Left Impaired/Limited, Right Impaired/Limited  RUE Impairment: Reduced strength, Limited AROM  LUE Impairment: Reduced strength, Limited AROM  UE ROM/ Strength Comment: Pt unable to perform AROM of shoulder against gravity. Pt able to perform bilateral AROM elbow flexion to approximately 100 degrees. PROM of bilateral shoulders and elbows WFL.       Coordination:  Coordination: Impaired    Sensation:  Sensory/ Proprioception/ Stereognosis comments: Sensation not assessed today    Balance:  Sitting balance mod A - CGA with VCs provided for keeping head up and bilateral hands on the bed to support self.    Functional Mobility  Transfer Assistance Needed:  (Not tested)  Bed Mobility Assistance Needed: Yes (Max A x2 for supine to sit EOB, total A for sit to supine and repositioning in bed)  Bed Mobility - Needs Assistance: Max assist  Ambulation: Not tested      ADLs  ADLs: Needs assistance with ADLs  ADLs - Needs Assistance: Feeding, Grooming, Bathing, Toileting, UB dressing, LB dressing  Feeding - Needs Assistance: Max assist, Performed at bed level  Grooming - Needs Assistance: Max assist, Performed at Bed level  Bathing - Needs Assistance: Max assist  Toileting - Needs Assistance: Total Assist  UB Dressing - Needs Assistance: Max assist  LB Dressing - Needs Assistance: Total Assist      Vitals / Orthostatics  At Rest: HR: 108, SpO2: 92% on 3L [place]  With Activity: HR: 130's, SpO2: 85% on 3L, increased to 89% on 6L [place], returned to 92% with non-rebreather. RN present and aware  Orthostatics: Asymptomatic      Medical Staff Made Aware: RN      Occupational Therapy Session Duration  OT Co-Treatment [mins]: 37  Reason for Co-treatment: Requires heavy assist for safety, To safely progress mobility, Poor activity tolerance (ICU status)         I attest that I have reviewed the above  information.  Signed: Caleen Essex, OT  Filed 09/15/2021    The care for this patient was completed by Caleen Essex, OT:  A student was present and participated in the care. Licensed/Credentialed therapist was physically present and immediately available to direct and supervise tasks that were related to patient management. The direction and supervision was continuous throughout the time these tasks were performed.    Caleen Essex, OT

## 2021-09-15 NOTE — Unmapped (Signed)
Central Venous Catheter Insertion Procedure Note     Date of Service: 09/13/2021    Patient Name: Miguel Ward  Patient MRN: [id number]    Line type:  Triple Lumen    Indications:  Inadequate peripheral access    Procedure Details:   Informed consent was obtained after explanation of the risks and benefits of the procedure, refer to the consent documentation.    Time-out was performed immediately prior to the procedure.    The left internal jugular vein was identified using bedside ultrasound. This area was prepped and draped in the usual sterile fashion. Maximum sterile technique was used including antiseptics, cap, gloves, gown, hand hygiene, mask, and sterile sheet.  The patient was placed in Trendelenburg position. Local anesthesia with 1% lidocaine was applied subcutaneously then deep to the skin. The finder was then inserted into the internal jugular vein using ultrasound guidance.    Using the Seldinger Technique a Triple Lumen was placed with each port easily flushed and freely drawing venous blood.    The catheter was secured with sutures. A sterile CHG drsg was applied to the site.    Condition:  The patient tolerated the procedure well and remains in the same condition as pre-procedure.    Complications:  None; patient tolerated the procedure well.    Plan:  CXR confirmed appropriate placement of central line. CVAD order placed OK to use     Hilda Blades, MD

## 2021-09-15 NOTE — Unmapped (Signed)
MICU Evening Summary     Date of Service: 09/15/2021    Interval History: CADELL GABRIELSON is a 70 y.o. male with PMHx HTN, 2009 renal transplant (no longer on ??iHD), CAD, HLD, HFrEF, NSTEMI, and lupus nephritis presented 9/25 for AMS. When EMS arrived, he was fully unresponsive, and hypoglycemic. ??He was given 250 mL of D10, and became semi conscious. He still only responded to pain and verbal, and was started on levo for SBP 90s.On arrival to ED he was intubated for airway protection and hypoxia. CXR c/f PNA . Critical care services are indicated for:    Principal Problem:    Acute respiratory distress syndrome (ARDS) due to COVID-19 virus (CMS-HCC)  Active Problems:    Hypercholesterolemia    Hypertension    History of kidney transplant    Renal cell carcinoma (CMS-HCC)    Immunosuppression-related infectious disease (CMS-HCC)    Bilateral lower extremity edema    Elevated troponin I level    GERD (gastroesophageal reflux disease)    Diastolic heart failure (CMS-HCC)    Acute on chronic HFrEF (heart failure with reduced ejection fraction) (CMS-HCC)    COVID    ATN (acute tubular necrosis) (CMS-HCC)    Acute renal failure with tubular necrosis (CMS-HCC)  Resolved Problems:    * No resolved hospital problems. *        Assessment & Plan     N: CT head 9/29 neg, pt/ot, somnolent but arouses to voice   P: extubated to  10/2, aggressive pulm toilet for airway clearance   CV: levo (tol levo at 8 for inc UF), SDS off 10/1, echo 9/26 EF 30-35% dec RV fxn, mod TR phtn, dd3, afib rate controlled   GI: TF increased bili. ??RUQ [place]: moderate vol ascites? hepatitis/cirrhosis, liver US doppler: 10/2 ??moderate volu ascites, Mildly sluggish flow in the right portal vein   R: baseline Cr 3, CRRT UF 100, increase as tolerated goal net negative 500 ml daily,  Renal US not infected   H: fem line- bleed after removal>>groin site benign. , heparin ppx ??   E: NAI   ID: repeat covid (-x3), cefe/vanc since 9/25 for BSA pna cvg, levaquin for Legionnaires's + thru to 10/5 per ICID, cont vanc for coag neg staph prelim cx, f/u hep B studies   Immuno: tac 1q12h. ??Hold cellcept     Critical Care Attestation     This patient is critically ill or injured with the impairment of vital organ systems such that there is a high probability of imminent or life threatening deterioration in the patient's condition. This patient must remain in the ICU for ongoing evaluation of the comprehensive management plan outlined in this note. I directly provided critical care services as documented in this note and the critical care time spent (45  min) is exclusive of separately billable procedures.  In addition to time spent for critical care management, I also provided advance care planning services for 0  minutes (see ACP note for details)???. Total billable critical care time 45  minutes.    Zakhi Dupre Willow Ora, ACNP

## 2021-09-15 NOTE — Unmapped (Signed)
Continuous Renal Replacement  Dialysis Nurse Therapy Procedure Note    Treatment Type:  [facility] Number Of Days On Therapy:  0 Procedure Date:  09/15/2021 3:17 AM     TREATMENT STATUS:  Continuous  Patient and Treatment Status     None          Active Dialysis Orders (168h ago, onward)     Start     Ordered    09/14/21 1014  CRRT Orders - NxStage (Adult)  Continuous        Comments: Fluid Removal Rate parameters:  MAP   < 50 mmHg 10 mL/hr;  MAP 51-60 mmHg 50 mL/hr;  MAP 61-70 mmHg 100 mL/hr;  MAP   > 70 mmHg 150 mL/hr   Question Answer Comment   CRRT System: NxStage    Modality: CVVH    Access: Right Internal Jugular    BFR (mL/min): 200-350    Dialysate Flow Rate (mL/kg/hr): Other (Specify) 1.7   Fluid Removal Initial Rate (mL/hr): 10    Fluid Removal Parameters: See below    Connect to Ecmo No        09/14/21 1013              SYSTEM CHECK:  Machine Name: [id number]  Dialyzer: [id number]   Self Test Completed: Yes.        Alarms Connected To The Wall And Active:  No.    VITAL SIGNS:  Temp:  [36.1 ??C (97 ??F)-36.6 ??C (97.9 ??F)] 36.4 ??C (97.6 ??F)  Heart Rate:  [76-113] 93  SpO2 Pulse:  [68-129] 92  Resp:  [10-31] 15  SpO2:  [97 %-100 %] 98 %  BP: (97-163)/(33-92) 123/50  MAP (mmHg):  [55-102] 75    ACCESS SITE:        Hemodialysis Catheter With Distal Infusion Port 09/08/21 Right Internal jugular (Active)   Site Assessment Clean;Dry;Intact 09/15/21 0143   Proximal Lumen Status / Patency Infusing 09/15/21 0143   Proximal Lumen Intervention Accessed 09/15/21 0143   Medial Lumen Status / Patency Infusing 09/15/21 0143   Medial Lumen Intervention Accessed 09/15/21 0143   Lumen 3, Distal Status / Patency Saline locked 09/15/21 0000   Distal Lumen Intervention Flushed 09/15/21 0000   IV Tubing and Needleless Injector Cap Change Due 09/17/21 09/15/21 0000   Dressing Type CHG gel;Hemostatic dressing;Occlusive 09/15/21 0143   Dressing Status      Clean;Dry;Intact/not removed 09/15/21 0143   Dressing Intervention No intervention needed 09/15/21 0143   Contraindicated due to: Dressing Intact surrounding insertion site 09/15/21 0000   Dressing Change Due 09/20/21 09/15/21 0143   Line Necessity Reviewed? Y 09/15/21 0143   Line Necessity Indications Yes - Hemodialysis 09/15/21 0143   Line Necessity Reviewed With med I 09/15/21 0000     Arteriovenous Fistula - Vein Graft  Access 11/01/18 Right;Upper Arm (Active)   Site Assessment Clean;Dry;Intact 09/15/21 0000   AV Fistula Thrill Present;Bruit Present 09/15/21 0000   Status Deaccessed 09/15/21 0000   Dressing Intervention No intervention needed 09/15/21 0000   Dressing Status      No dressing 09/15/21 0000   Site Condition No complications 09/15/21 0000   Dressing Open to air (None) 09/15/21 0000       CATHETER FILL VOLUMES:     Arterial: 1.4 mL  Venous: 1.4 mL     Lab Results   Component Value Date    NA 136 09/14/2021    K 4.6 09/14/2021    CL 104 09/14/2021  CO2 27.0 09/14/2021    BUN 36 (H) 09/14/2021     Lab Results   Component Value Date    CALCIUM 8.7 09/14/2021    CAION 5.27 09/14/2021    PHOS 2.7 09/14/2021    MG 1.9 09/14/2021        SETTINGS:  Blood Pump Rate: 300 mL/min  Replacement Fluid Rate:     Pre-Blood Pump Fluid Rate:    Hourly Fluid Removal Rate: 100 mL/hr   Dialysate Fluid Rate    Therapy Fluid Temperature:       ANTICOAGULANT:  None    ADDITIONAL COMMENTS:  None    HEMODIALYSIS ON-CALL NURSE PAGER NUMBER:  ?? Monday thru Saturday 0700 - 1730: Call the Dialysis Unit ext. [phone]   ?? After 1730 and all day Sunday: Call the Dialysis RN Pager Number [phone]     PROCEDURE REVIEW, VERIFICATION, HANDOFF:  CRRT settings verified, procedure reviewed, and instructions given to primary RN.     Primary CRRT RN Verifying: c seloover RN Dialysis RN Verifying: Manley Mason

## 2021-09-15 NOTE — Unmapped (Signed)
MICU Daily Progress Note     Date of Service: 09/15/2021    Problem List:   Principal Problem:    Acute respiratory distress syndrome (ARDS) due to COVID-19 virus (CMS-HCC)  Active Problems:    Hypercholesterolemia    Hypertension    History of kidney transplant    Renal cell carcinoma (CMS-HCC)    Immunosuppression-related infectious disease (CMS-HCC)    Bilateral lower extremity edema    Elevated troponin I level    GERD (gastroesophageal reflux disease)    Diastolic heart failure (CMS-HCC)    Acute on chronic HFrEF (heart failure with reduced ejection fraction) (CMS-HCC)    COVID    ATN (acute tubular necrosis) (CMS-HCC)    Acute renal failure with tubular necrosis (CMS-HCC)  Resolved Problems:    * No resolved hospital problems. *      HPI: Miguel Ward is a 70 y.o. male renal transplant from lupus nephritis (complex course over last 15 years since transplant followed here including renal cell) here with acute hypoxic respiratory failure, COVID 19 and shock with AKI.    24hr events:  - stable since extubation, tolerating LBNC today  - remains on CRRT     Neurological   #acute metabolic encephalopathy  - analegsia prn  - CT head 9/29 no acute findings  - neuro exam improving >> sustained wakefulness and interaction, non focal. Remains intermittently drowsy     Pulmonary   #ARDS, Acute resp failure, resolving   - intubated 9/25 on minimal settings, passing SBTs yet precluded extubation for mentation  - bronched 9/29 secretions blood tinged in trach and mainstem airways, none seen in distal airways  - passed SBT w improved wakefulness and ability to clear cough, extubated to [place] on 10/2  - tolerating  well       Cardiovascular   #septic shock, elevated trop  #HFrEF, ho htn, hld, cad  - formal echo 9/26 EF @ 30%, 2020 ECHO 40-45%  - cont home regimen asa statin  - hold home regimen metop, vasotec, lasix  - SDS 9/26--> 10/1  - cont low dose levo for MAP>65, off since 10/2    Renal   #AKI (baseline Cr 2.7-3), AGMA likely ATN 2/2 sepsis and hypotension  #hx renal transplant s/t lupus nephritis, c/b rejection and renal cell CA s/p cryo ablation  - foley for accurate I/O during critical illness   - worsening renal fxn and CRRT started 9/26. UF decreased to 10 with increased pressors requirement, improved after restarting CRRT, able to increase 50 and will increase to 100 this afternoon  - continue tac with goal of 2-4   - cont home prednisone at a higher dose 15 mg while acutely sick to be taper back down to home dose of 5 mg  - continue to hold cellcept   - cont magox and calcitriol  - replete electrolytes prn  - Increased UF today, plan for a no restart given he is off pressors     Infectious Disease/Autoimmune   #COVID 19 infection  Symptom onset:??9/22  Exposure: unknown  COVID PCR+: neg x 3  (home test positive 9/23?)  Day of admission/transfer: 9/25  COVID Specific??Meds:??none given   Vaccination status: full & boosted x once, evushield given 8/17  3 PCRs since admission negative and taken off precautions   ??  9/25 Bcx neg   9/25 Ucx pending, UA 3 WBC  9/25 Lower resp cx, pending, OPF  9/25 SARS-CoV-2 negative for NP and trach aspirate  9/25 CrAg negative  9/26 RPP negative  9/26 Legionella Ag (urine) positive  9/26 Legionella PCR positive  9/26 CMV PCR negative  ??  Multifocal PNA:   - on vanco/cefepime on 9/25, stopped 10/3 per ICID  - MRSA screen neg and vanco stopped 9/27, restarted 9/29 in the setting of worsening pressor requirements and GPC seen on BAL from [place] on 9/29  - CT Chest 9/29 c/w volume overload, pna and rt pleural effusion      Legionella, PNA   - levofloxacin started 9/26 continue for a 7 day course  ??  Cultures:  Blood Culture, Routine (no units)   Date Value   09/11/2021 No Growth at 72 hours     Urine Culture, Comprehensive (no units)   Date Value   10/01/2021 NO GROWTH     Lower Respiratory Culture (no units)   Date Value   09/11/2021 Specimen Not Processed   10-01-2021 OROPHARYNGEAL FLORA ISOLATED     WBC (10*9/L)   Date Value   09/15/2021 26.4 (H)     WBC, UA (/HPF)   Date Value   10/01/2021 <1        FEN/GI   #gerd  -  TF tol well at goal  - ppi for gi ppx  - bowel regimen held for loose stools    Transaminitis and Elevated bilirubin: overall all uptrending, [place] RUQ didn't demonstrated acute cholecystitis.  - continue to trend LFTs  - potential need for HIDA if continues to increase holding off given bilirubin downtrending     Malnutrition Assessment: Not done yet.  Body mass index is 22.68 kg/m??.        Heme/Coag   Rt groin bleeding s/p line removal, resolved  - was on heparin gtt for NSTEMI and afib- stopped 9/27  - continue SQH for DVT ppx  - HH, plts, coags acceptable  - no evid active bleeding  - SCDs for DVT ppx  - last transfused 9/27    Endocrine   #hypoglycemic, hypothryoid  - glucose within target range yes  - cont ADI + accu checks   - hold home regimen allopurinol  - cont home regimen synthroid    Integumentary   #NAI  - WOCN consulted for high risk skin assessment No. Reason: no alterations in skin.  - WOCN recs >> agree with assmt and plan 9/30  - cont pressure mitigating precautions per skin policy    Prophylaxis/LDA/Restraints/Consults   Can CVC be removed? No: dialysis catheter   Can A-line be removed? No: >moderate dose pressors  Can Foley be removed? No: Need continuous I/O  Mobility plan: Step 2 - Head of bed elevation (>60 degrees)    Feeding: Tube feeds at goal  Analgesia: Pain adequately controlled  Sedation SAT/SBT: Yes  Thromboembolic ppx: SQ heparin  Head of bed >30 degrees: Yes  Ulcer ppx: Yes, home use continued  Glucose within target range: Yes, in range    Does patient need/have an active type/screen? Yes    RASS at goal? No - adjusting sedation or order to reflect need  [place] Agitation Assessment Scale (RASS) : -1 (09/15/2021  4:00 AM)     Can antipsychotics be stopped? N/A, not on antipsychotics  CAM-ICU Result: Positive (09/10/2021  8:00 PM)      Would hospice care be appropriate for this patient? No, patient improving or expected to improve  Any unaddressed hospice/palliative care needs? no    Patient Lines/Drains/Airways Status     Active Active Lines,  Drains, & Airways     Name Placement date Placement time Site Days    CVC Triple Lumen 09/13/21 Non-tunneled Left Internal jugular 09/13/21  1848  Internal jugular  1    Hemodialysis Catheter With Distal Infusion Port 09/08/21 Right Internal jugular 09/08/21  1600  Internal jugular  6    NG/OG Tube Feedings Left nostril 09/14/21  1500  Left nostril  less than 1    Urethral Catheter Temperature probe 16 Fr. 09/02/2021  1602  Temperature probe  7    Arteriovenous Fistula - Vein Graft  Access 11/01/18 Right;Upper Arm 11/01/18  --  Arm  1049              Patient Lines/Drains/Airways Status     Active Wounds     Name Placement date Placement time Site Days    Wound 09/09/21 Pressure Injury Sacrum Right;Mid dark red nonblanching with dark skin surrounding DTI 09/09/21  2130  Sacrum  5                Goals of Care     Code Status: Full Code    Designated Healthcare Decision Maker:  Mr. Loconte current decisional capacity for healthcare decision-making is Full capacity. His designated healthcare decision maker(s) is/are   HCDM (patient stated preference): Mathisen,Katherine - Spouse - [phone].    Subjective   Sleepy, but awakes to voice and responds appropriately     Objective     Vitals - past 24 hours  Temp:  [36.1 ??C (97 ??F)-36.4 ??C (97.6 ??F)] 36.4 ??C (97.5 ??F)  Heart Rate:  [76-113] 106  SpO2 Pulse:  [79-129] 101  Resp:  [10-26] 21  BP: (109-163)/(40-92) 129/50  SpO2:  [87 %-100 %] 93 % Intake/Output  I/O last 3 completed shifts:  In: 2989.3 [I.V.:595.7; UU/VO:5366; IV Piggyback:1008.5]  Out: 2678 [Urine:77; Other:2601]     Physical Exam:    General: thin elderly male, in NAD   HEENT: PERRL, slightly icteric sclera w edema  CV: RRR, no m/r/g, 2+ BLE edema   Pulm: diminished throughout, scattered rhonchi, dec bases  GI: abd round, non tender  MSK: grossly intact, tone intact  Skin: grossly intact, warm  Neuro: FSC, CN grossly intact, non focal motor, BUE 2/5, BLE 2/5       Continuous Infusions:   ??? norepinephrine bitartrate-NS Stopped (09/14/21 1615)   ??? NxStage RFP 400 (+/- BB) 5000 mL - contains 2 mEq/L of potassium     ??? NxStage/multiBic RFP 401 (+/- BB) 5000 mL - contains 4 mEq/L of potassium         Scheduled Medications:   ??? aspirin  81 mg Enteral tube: gastric  Daily   ??? calcitRIOL  0.25 mcg Enteral tube: gastric  Q MWF   ??? Cefepime  1 g Intravenous [facility]   ??? chlorhexidine  5 mL Mouth BID   ??? esomeprazole  40 mg Enteral tube: gastric  daily   ??? heparin (porcine)  5,000 Units Subcutaneous [facility]   ??? flu vacc qs2022-23 6mos up(PF)  0.5 mL Intramuscular During hospitalization   ??? levoFLOXacin  500 mg Enteral tube: gastric  Daily   ??? levothyroxine  250 mcg Enteral tube: gastric  daily   ??? magnesium oxide  400 mg Enteral tube: gastric  BID   ??? predniSONE  15 mg Oral Daily   ??? sodium phosphate IVPB in 250 ml  15 mmol Intravenous Once   ??? Tacrolimus  1 mg Enteral tube: gastric  BID   ??? vancomycin  750 mg Intravenous Q24H   ??? white petrolatum-mineral oil  1 application Both Eyes BID       PRN medications:  dextrose in water, heparin (porcine), heparin (porcine), ondansetron    Data/Imaging Review: Reviewed in Epic and personally interpreted on 09/15/2021. See EMR for detailed results.         Critical Care Attestation     This patient is critically ill or injured with the impairment of vital organ systems such that there is a high probability of imminent or life threatening deterioration in the patient's condition. This patient must remain in the ICU for ongoing evaluation of the comprehensive management plan outlined in this note. I directly provided critical care services as documented in this note and the critical care time spent (50  min) is exclusive of separately billable procedures.  In addition to time spent for critical care management, I also provided advance care planning services for 0  minutes (see ACP note for details)???. Total billable critical care time 50  minutes.    Daryel November, ACNP

## 2021-09-15 NOTE — Unmapped (Signed)
Tacrolimus Therapeutic Monitoring Pharmacy Note    Miguel Ward is a 70 y.o. male continuing tacrolimus.     Indication: Kidney transplant     Date of Transplant: 06/11/2007      Prior Dosing Information: Home regimen 3 mg BID . Current regimen 1mg  bid    Goals:  Therapeutic Drug Levels  Tacrolimus trough goal: 2-4 mg/mL -- reduced in the setting of respiratory failure 2/2 legionella pneumonia    Additional Clinical Monitoring/Outcomes  ?? Monitor renal function (SCr and urine output) and liver function (LFTs)  ?? Monitor for signs/symptoms of adverse events (e.g., hyperglycemia, hyperkalemia, hypomagnesemia, hypertension, headache, tremor)    Results:   Tacrolimus level:   10/1 tacro = 4.3ng/mL at 0609   10/2 tacro = 4.0ng/mL at 0545   10/3 tacro = 2.9ng/ml at 0743    Pharmacokinetic Considerations and Significant Drug Interactions:  ??? Concurrent hepatotoxic medications: APAP (q8h schedule) - Atorva:   BOTH DC'd 10/2)  ??? Concurrent CYP3A4 substrates/inhibitors: at time of note  ??? Concurrent nephrotoxic medications: Lasix, acyclovir        Assessment/Plan:   Tacrolimus level in goal range    Recommendedation(s)  ??? continue tacrolimus SUSP 1 mg BID   Tacrolimus level stable and can change monitoring to 2-3 times per week at this time    Follow-up  ??? Next tacrolimus trough on 09/17/21 at 0700  ???  pharmacist will continue to monitor and recommend levels as appropriate    Please page service pharmacist with questions/clarifications.    Beverely Risen, PharmD

## 2021-09-16 LAB — BASIC METABOLIC PANEL
ANION GAP: 3 mmol/L — ABNORMAL LOW (ref 5–14)
ANION GAP: 6 mmol/L (ref 5–14)
ANION GAP: 6 mmol/L (ref 5–14)
BLOOD UREA NITROGEN: 41 mg/dL — ABNORMAL HIGH (ref 9–23)
BLOOD UREA NITROGEN: 45 mg/dL — ABNORMAL HIGH (ref 9–23)
BLOOD UREA NITROGEN: 57 mg/dL — ABNORMAL HIGH (ref 9–23)
BUN / CREAT RATIO: 28
BUN / CREAT RATIO: 31
BUN / CREAT RATIO: 30
CALCIUM: 8.7 mg/dL (ref 8.7–10.4)
CALCIUM: 8.5 mg/dL — ABNORMAL LOW (ref 8.7–10.4)
CALCIUM: 8.7 mg/dL (ref 8.7–10.4)
CHLORIDE: 105 mmol/L (ref 98–107)
CHLORIDE: 102 mmol/L (ref 98–107)
CHLORIDE: 104 mmol/L (ref 98–107)
CO2: 28 mmol/L (ref 20.0–31.0)
CO2: 27 mmol/L (ref 20.0–31.0)
CO2: 27 mmol/L (ref 20.0–31.0)
CREATININE: 1.44 mg/dL — ABNORMAL HIGH
CREATININE: 1.44 mg/dL — ABNORMAL HIGH
CREATININE: 1.9 mg/dL — ABNORMAL HIGH
EGFR CKD-EPI (2021) MALE: 53 mL/min/{1.73_m2} — ABNORMAL LOW (ref >=60)
EGFR CKD-EPI (2021) MALE: 53 mL/min/{1.73_m2} — ABNORMAL LOW (ref >=60)
EGFR CKD-EPI (2021) MALE: 38 mL/min/{1.73_m2} — ABNORMAL LOW (ref >=60)
GLUCOSE RANDOM: 206 mg/dL — ABNORMAL HIGH (ref 70–179)
GLUCOSE RANDOM: 291 mg/dL — ABNORMAL HIGH (ref 70–179)
GLUCOSE RANDOM: 288 mg/dL — ABNORMAL HIGH (ref 70–179)
POTASSIUM: 4.8 mmol/L (ref 3.4–4.8)
POTASSIUM: 5.2 mmol/L — ABNORMAL HIGH (ref 3.4–4.8)
POTASSIUM: 5.6 mmol/L — ABNORMAL HIGH (ref 3.4–4.8)
SODIUM: 136 mmol/L (ref 135–145)
SODIUM: 135 mmol/L (ref 135–145)
SODIUM: 137 mmol/L (ref 135–145)

## 2021-09-16 LAB — CBC
HEMATOCRIT: 21.4 % — ABNORMAL LOW (ref 39.0–48.0)
HEMATOCRIT: 22.5 % — ABNORMAL LOW (ref 39.0–48.0)
HEMOGLOBIN: 6.6 g/dL — ABNORMAL LOW (ref 12.9–16.5)
HEMOGLOBIN: 7.2 g/dL — ABNORMAL LOW (ref 12.9–16.5)
MEAN CORPUSCULAR HEMOGLOBIN CONC: 31.1 g/dL — ABNORMAL LOW (ref 32.0–36.0)
MEAN CORPUSCULAR HEMOGLOBIN CONC: 31.9 g/dL — ABNORMAL LOW (ref 32.0–36.0)
MEAN CORPUSCULAR HEMOGLOBIN: 27.1 pg (ref 25.9–32.4)
MEAN CORPUSCULAR HEMOGLOBIN: 27.5 pg (ref 25.9–32.4)
MEAN CORPUSCULAR VOLUME: 87.3 fL (ref 77.6–95.7)
MEAN CORPUSCULAR VOLUME: 86.3 fL (ref 77.6–95.7)
MEAN PLATELET VOLUME: 11.2 fL — ABNORMAL HIGH (ref 6.8–10.7)
MEAN PLATELET VOLUME: 11.1 fL — ABNORMAL HIGH (ref 6.8–10.7)
PLATELET COUNT: 76 10*9/L — ABNORMAL LOW (ref 150–450)
PLATELET COUNT: 81 10*9/L — ABNORMAL LOW (ref 150–450)
RED BLOOD CELL COUNT: 2.45 10*12/L — ABNORMAL LOW (ref 4.26–5.60)
RED BLOOD CELL COUNT: 2.61 10*12/L — ABNORMAL LOW (ref 4.26–5.60)
RED CELL DISTRIBUTION WIDTH: 16.2 % — ABNORMAL HIGH (ref 12.2–15.2)
RED CELL DISTRIBUTION WIDTH: 15.7 % — ABNORMAL HIGH (ref 12.2–15.2)
WBC ADJUSTED: 22.3 10*9/L — ABNORMAL HIGH (ref 3.6–11.2)
WBC ADJUSTED: 20.4 10*9/L — ABNORMAL HIGH (ref 3.6–11.2)

## 2021-09-16 LAB — BLOOD GAS CRITICAL CARE PANEL, VENOUS
BASE EXCESS VENOUS: 3.6 — ABNORMAL HIGH (ref -2.0–2.0)
BASE EXCESS VENOUS: 3.9 — ABNORMAL HIGH (ref -2.0–2.0)
BASE EXCESS VENOUS: 4.1 — ABNORMAL HIGH (ref -2.0–2.0)
BASE EXCESS VENOUS: 4.8 — ABNORMAL HIGH (ref -2.0–2.0)
CALCIUM IONIZED VENOUS (MG/DL): 5.02 mg/dL (ref 4.40–5.40)
CALCIUM IONIZED VENOUS (MG/DL): 5.12 mg/dL (ref 4.40–5.40)
CALCIUM IONIZED VENOUS (MG/DL): 5.26 mg/dL (ref 4.40–5.40)
CALCIUM IONIZED VENOUS (MG/DL): 5.37 mg/dL (ref 4.40–5.40)
GLUCOSE WHOLE BLOOD: 213 mg/dL — ABNORMAL HIGH (ref 70–179)
GLUCOSE WHOLE BLOOD: 225 mg/dL — ABNORMAL HIGH (ref 70–179)
GLUCOSE WHOLE BLOOD: 254 mg/dL — ABNORMAL HIGH (ref 70–179)
GLUCOSE WHOLE BLOOD: 268 mg/dL — ABNORMAL HIGH (ref 70–179)
HCO3 VENOUS: 28 mmol/L — ABNORMAL HIGH (ref 22–27)
HCO3 VENOUS: 29 mmol/L — ABNORMAL HIGH (ref 22–27)
HCO3 VENOUS: 29 mmol/L — ABNORMAL HIGH (ref 22–27)
HCO3 VENOUS: 29 mmol/L — ABNORMAL HIGH (ref 22–27)
HEMOGLOBIN BLOOD GAS: 6.5 g/dL — ABNORMAL LOW
HEMOGLOBIN BLOOD GAS: 7.1 g/dL — ABNORMAL LOW
HEMOGLOBIN BLOOD GAS: 7.5 g/dL — ABNORMAL LOW
HEMOGLOBIN BLOOD GAS: 8.8 g/dL — ABNORMAL LOW (ref 13.50–17.50)
LACTATE BLOOD VENOUS: 1.4 mmol/L (ref 0.5–1.8)
LACTATE BLOOD VENOUS: 1.7 mmol/L (ref 0.5–1.8)
LACTATE BLOOD VENOUS: 1.7 mmol/L (ref 0.5–1.8)
LACTATE BLOOD VENOUS: 1.7 mmol/L (ref 0.5–1.8)
O2 SATURATION VENOUS: 59.6 % (ref 40.0–85.0)
O2 SATURATION VENOUS: 69.4 % (ref 40.0–85.0)
O2 SATURATION VENOUS: 74.3 % (ref 40.0–85.0)
O2 SATURATION VENOUS: 85.3 % — ABNORMAL HIGH (ref 40.0–85.0)
PCO2 VENOUS: 41 mmHg (ref 40–60)
PCO2 VENOUS: 46 mmHg (ref 40–60)
PCO2 VENOUS: 50 mmHg (ref 40–60)
PCO2 VENOUS: 50 mmHg (ref 40–60)
PH VENOUS: 7.37 (ref 7.32–7.43)
PH VENOUS: 7.38 (ref 7.32–7.43)
PH VENOUS: 7.41 (ref 7.32–7.43)
PH VENOUS: 7.46 — ABNORMAL HIGH (ref 7.32–7.43)
PO2 VENOUS: 31 mmHg (ref 30–55)
PO2 VENOUS: 33 mmHg (ref 30–55)
PO2 VENOUS: 39 mmHg (ref 30–55)
PO2 VENOUS: 53 mmHg (ref 30–55)
POTASSIUM WHOLE BLOOD: 4.7 mmol/L — ABNORMAL HIGH (ref 3.4–4.6)
POTASSIUM WHOLE BLOOD: 5 mmol/L — ABNORMAL HIGH (ref 3.4–4.6)
POTASSIUM WHOLE BLOOD: 5.4 mmol/L — ABNORMAL HIGH (ref 3.4–4.6)
POTASSIUM WHOLE BLOOD: 5.4 mmol/L — ABNORMAL HIGH (ref 3.4–4.6)
SODIUM WHOLE BLOOD: 135 mmol/L (ref 135–145)
SODIUM WHOLE BLOOD: 136 mmol/L (ref 135–145)
SODIUM WHOLE BLOOD: 137 mmol/L (ref 135–145)
SODIUM WHOLE BLOOD: 138 mmol/L (ref 135–145)

## 2021-09-16 LAB — D-DIMER, QUANTITATIVE
D-DIMER QUANTITATIVE (CW,ML,HL,HS,CH,JS,JC): 1593 ng{FEU}/mL — ABNORMAL HIGH (ref <=500)
D-DIMER QUANTITATIVE (CW,ML,HL,HS,CH,JS,JC): 1751 ng{FEU}/mL — ABNORMAL HIGH (ref <=500)
D-DIMER QUANTITATIVE (CW,ML,HL,HS,CH,JS,JC): 1762 ng{FEU}/mL — ABNORMAL HIGH (ref <=500)
D-DIMER QUANTITATIVE (CW,ML,HL,HS,CH,JS,JC): 1539 ng{FEU}/mL — ABNORMAL HIGH (ref <=500)

## 2021-09-16 LAB — CBC W/ AUTO DIFF
BASOPHILS ABSOLUTE COUNT: 0.1 10*9/L (ref 0.0–0.1)
BASOPHILS RELATIVE PERCENT: 0.2 %
EOSINOPHILS ABSOLUTE COUNT: 0.1 10*9/L (ref 0.0–0.5)
EOSINOPHILS RELATIVE PERCENT: 0.3 %
HEMATOCRIT: 22.6 % — ABNORMAL LOW (ref 39.0–48.0)
HEMOGLOBIN: 7.1 g/dL — ABNORMAL LOW (ref 12.9–16.5)
LYMPHOCYTES ABSOLUTE COUNT: 0.8 10*9/L — ABNORMAL LOW (ref 1.1–3.6)
LYMPHOCYTES RELATIVE PERCENT: 3.6 %
MEAN CORPUSCULAR HEMOGLOBIN CONC: 31.5 g/dL — ABNORMAL LOW (ref 32.0–36.0)
MEAN CORPUSCULAR HEMOGLOBIN: 27 pg (ref 25.9–32.4)
MEAN CORPUSCULAR VOLUME: 85.7 fL (ref 77.6–95.7)
MEAN PLATELET VOLUME: 11 fL — ABNORMAL HIGH (ref 6.8–10.7)
MONOCYTES ABSOLUTE COUNT: 1.2 10*9/L — ABNORMAL HIGH (ref 0.3–0.8)
MONOCYTES RELATIVE PERCENT: 5.3 %
NEUTROPHILS ABSOLUTE COUNT: 21.2 10*9/L — ABNORMAL HIGH (ref 1.8–7.8)
NEUTROPHILS RELATIVE PERCENT: 90.6 %
PLATELET COUNT: 77 10*9/L — ABNORMAL LOW (ref 150–450)
RED BLOOD CELL COUNT: 2.63 10*12/L — ABNORMAL LOW (ref 4.26–5.60)
RED CELL DISTRIBUTION WIDTH: 16 % — ABNORMAL HIGH (ref 12.2–15.2)
WBC ADJUSTED: 23.4 10*9/L — ABNORMAL HIGH (ref 3.6–11.2)

## 2021-09-16 LAB — HEPATIC FUNCTION PANEL
ALBUMIN: 1.6 g/dL — ABNORMAL LOW (ref 3.4–5.0)
ALKALINE PHOSPHATASE: 957 U/L — ABNORMAL HIGH (ref 46–116)
ALT (SGPT): 209 U/L — ABNORMAL HIGH (ref 10–49)
AST (SGOT): 259 U/L — ABNORMAL HIGH (ref <=34)
BILIRUBIN DIRECT: 0.7 mg/dL — ABNORMAL HIGH (ref 0.00–0.30)
BILIRUBIN TOTAL: 1 mg/dL (ref 0.3–1.2)
PROTEIN TOTAL: 4.9 g/dL — ABNORMAL LOW (ref 5.7–8.2)

## 2021-09-16 LAB — HEPATITIS B CORE ANTIBODY, IGM: HEPATITIS B CORE IGM ANTIBODY: NONREACTIVE

## 2021-09-16 LAB — CK: CREATINE KINASE TOTAL: 94 U/L

## 2021-09-16 LAB — PROTIME-INR
INR: 1.31
INR: 1.35
INR: 1.38
INR: 1.33
PROTIME: 15 s — ABNORMAL HIGH (ref 9.8–12.8)
PROTIME: 15.5 s — ABNORMAL HIGH (ref 9.8–12.8)
PROTIME: 15.8 s — ABNORMAL HIGH (ref 9.8–12.8)
PROTIME: 15.2 s — ABNORMAL HIGH (ref 9.8–12.8)

## 2021-09-16 LAB — APTT
APTT: 35.4 s (ref 25.1–36.5)
APTT: 35.7 s (ref 25.1–36.5)
APTT: 34.5 s (ref 25.1–36.5)
APTT: 36.5 s (ref 25.1–36.5)
HEPARIN CORRELATION: 0.2
HEPARIN CORRELATION: 0.2
HEPARIN CORRELATION: 0.2
HEPARIN CORRELATION: 0.2

## 2021-09-16 LAB — HEPATITIS B SURFACE ANTIBODY
HEPATITIS B SURFACE ANTIBODY QUANT: 21.74 m[IU]/mL — ABNORMAL HIGH (ref <8.00)
HEPATITIS B SURFACE ANTIBODY: REACTIVE — AB

## 2021-09-16 LAB — FIBRINOGEN
FIBRINOGEN LEVEL: 358 mg/dL (ref 175–500)
FIBRINOGEN LEVEL: 320 mg/dL (ref 175–500)
FIBRINOGEN LEVEL: 343 mg/dL (ref 175–500)
FIBRINOGEN LEVEL: 320 mg/dL (ref 175–500)

## 2021-09-16 LAB — PHOSPHORUS
PHOSPHORUS: 2.1 mg/dL — ABNORMAL LOW (ref 2.4–5.1)
PHOSPHORUS: 1.4 mg/dL — ABNORMAL LOW (ref 2.4–5.1)
PHOSPHORUS: 2.2 mg/dL — ABNORMAL LOW (ref 2.4–5.1)

## 2021-09-16 LAB — GAMMA GT: GAMMA GLUTAMYL TRANSFERASE: 991 U/L — ABNORMAL HIGH

## 2021-09-16 LAB — HEPATITIS B CORE ANTIBODY, TOTAL: HEPATITIS B CORE TOTAL ANTIBODY: NONREACTIVE

## 2021-09-16 LAB — TACROLIMUS LEVEL, TROUGH: TACROLIMUS, TROUGH: 2.5 ng/mL — ABNORMAL LOW (ref 5.0–15.0)

## 2021-09-16 LAB — MAGNESIUM
MAGNESIUM: 1.9 mg/dL (ref 1.6–2.6)
MAGNESIUM: 1.8 mg/dL (ref 1.6–2.6)
MAGNESIUM: 2.1 mg/dL (ref 1.6–2.6)

## 2021-09-16 LAB — PLATELET COUNT: PLATELET COUNT: 87 10*9/L — ABNORMAL LOW (ref 150–450)

## 2021-09-16 MED ADMIN — insulin regular (HumuLIN,NovoLIN) injection 0-20 Units: 0-20 [IU] | SUBCUTANEOUS | @ 22:00:00

## 2021-09-16 MED ADMIN — NxStage/multiBic RFP 401 (+/- BB) 5000 mL - contains 4 mEq/L of potassium dialysis solution 5,000 mL: 5000 mL | INTRAVENOUS_CENTRAL | @ 06:00:00 | Stop: 2021-09-16

## 2021-09-16 MED ADMIN — tacrolimus (PROGRAF) oral suspension: 1 mg | GASTROENTERAL | @ 13:00:00

## 2021-09-16 MED ADMIN — cellulose, oxidized reg 4"X 8" pad: TOPICAL | @ 07:00:00 | Stop: 2021-09-16

## 2021-09-16 MED ADMIN — esomeprazole (NexIUM) granules 40 mg: 40 mg | GASTROENTERAL | @ 09:00:00

## 2021-09-16 MED ADMIN — insulin regular (HumuLIN,NovoLIN) injection 0-20 Units: 0-20 [IU] | SUBCUTANEOUS | @ 16:00:00

## 2021-09-16 MED ADMIN — levoFLOXacin (LEVAQUIN) tablet 500 mg: 500 mg | GASTROENTERAL | @ 13:00:00 | Stop: 2021-09-16

## 2021-09-16 MED ADMIN — heparin (porcine) 5,000 unit/mL injection 5,000 Units: 5000 [IU] | SUBCUTANEOUS | @ 09:00:00

## 2021-09-16 MED ADMIN — insulin regular (HumuLIN,NovoLIN) injection 0-20 Units: 0-20 [IU] | SUBCUTANEOUS | @ 09:00:00

## 2021-09-16 MED ADMIN — magnesium oxide (MAG-OX) tablet 400 mg: 400 mg | GASTROENTERAL | @ 13:00:00

## 2021-09-16 MED ADMIN — predniSONE (DELTASONE) tablet 15 mg: 15 mg | ORAL | @ 13:00:00

## 2021-09-16 MED ADMIN — heparin (porcine) 1000 unit/mL injection 2,000 Units: 2 mL | @ 15:00:00

## 2021-09-16 MED ADMIN — levothyroxine (SYNTHROID) tablet 250 mcg: 250 ug | GASTROENTERAL | @ 09:00:00

## 2021-09-16 MED ADMIN — heparin (porcine) 5,000 unit/mL injection 5,000 Units: 5000 [IU] | SUBCUTANEOUS | @ 19:00:00

## 2021-09-16 MED ADMIN — aspirin chewable tablet 81 mg: 81.0 mg | GASTROENTERAL | @ 13:00:00

## 2021-09-16 NOTE — Unmapped (Signed)
RASS 0. Pt disoriented to time; able to follow commands & responds appropriately to questions. A fib; BP stable without pressors. 3 L [place]. No BM overnight. Minimal urine output via foley catheter. CVAD RN at bedside 2x - see CVAD RN note.     Problem: Fall Injury Risk  Goal: Absence of Fall and Fall-Related Injury  Outcome: Ongoing - Unchanged  Intervention: Promote Injury-Free Environment  Recent Flowsheet Documentation  Taken 09/15/2021 2000 by Wanda Plump, RN  Safety Interventions:   aspiration precautions   bed alarm   bleeding precautions   fall reduction program maintained   infection management   low bed     Problem: Adult Inpatient Plan of Care  Goal: Plan of Care Review  Outcome: Ongoing - Unchanged  Goal: Patient-Specific Goal (Individualized)  Outcome: Ongoing - Unchanged  Goal: Absence of Hospital-Acquired Illness or Injury  Outcome: Ongoing - Unchanged  Intervention: Identify and Manage Fall Risk  Recent Flowsheet Documentation  Taken 09/15/2021 2000 by Wanda Plump, RN  Safety Interventions:   aspiration precautions   bed alarm   bleeding precautions   fall reduction program maintained   infection management   low bed  Intervention: Prevent Skin Injury  Recent Flowsheet Documentation  Taken 09/15/2021 2000 by Wanda Plump, RN  Skin Protection: adhesive use limited  Intervention: Prevent and Manage VTE (Venous Thromboembolism) Risk  Recent Flowsheet Documentation  Taken 09/15/2021 2000 by Wanda Plump, RN  Activity Management: bedrest  Range of Motion: Bilateral Upper and Lower Extremities  Intervention: Prevent Infection  Recent Flowsheet Documentation  Taken 09/15/2021 2000 by Wanda Plump, RN  Infection Prevention: hand hygiene promoted  Goal: Optimal Comfort and Wellbeing  Outcome: Ongoing - Unchanged  Goal: Readiness for Transition of Care  Outcome: Ongoing - Unchanged  Goal: Rounds/Family Conference  Outcome: Ongoing - Unchanged     Problem: Skin Injury Risk Increased  Goal: Skin Health and Integrity  Outcome: Ongoing - Unchanged  Intervention: Optimize Skin Protection  Recent Flowsheet Documentation  Taken 09/16/2021 0400 by Wanda Plump, RN  Head of Bed [facility]) Positioning: HOB at 30 degrees  Taken 09/16/2021 0000 by Wanda Plump, RN  Head of Bed [facility]) Positioning: HOB at 30-45 degrees  Taken 09/15/2021 2200 by Wanda Plump, RN  Head of Bed [facility]) Positioning: HOB at 30-45 degrees  Taken 09/15/2021 2000 by Wanda Plump, RN  Pressure Reduction Techniques: frequent weight shift encouraged  Head of Bed (HOB) Positioning: HOB at 30-45 degrees  Pressure Reduction Devices: specialty bed utilized  Skin Protection: adhesive use limited     Problem: Dysrhythmia (Heart Failure)  Goal: Stable Heart Rate and Rhythm  Outcome: Ongoing - Unchanged     Problem: Fluid Imbalance (Heart Failure)  Goal: Fluid Balance  Outcome: Ongoing - Unchanged     Problem: Nutrition Impairment (Mechanical Ventilation, Invasive)  Goal: Optimal Nutrition Delivery  Outcome: Ongoing - Unchanged     Problem: Impaired Wound Healing  Goal: Optimal Wound Healing  Outcome: Ongoing - Unchanged  Intervention: Promote Wound Healing  Recent Flowsheet Documentation  Taken 09/15/2021 2000 by Wanda Plump, RN  Activity Management: bedrest     Problem: Self-Care Deficit  Goal: Improved Ability to Complete Activities of Daily Living  Outcome: Ongoing - Unchanged     Problem: Diabetes Comorbidity  Goal: Blood Glucose Level Within Targeted Range  Outcome: Ongoing - Unchanged  Intervention: Monitor and Manage Glycemia  Recent Flowsheet Documentation  Taken 09/15/2021 2000 by Lenis Dickinson  Nadene Rubins, RN  Glycemic Management: blood glucose monitored     Problem: Heart Failure Comorbidity  Goal: Maintenance of Heart Failure Symptom Control  Outcome: Ongoing - Unchanged     Problem: Hypertension Comorbidity  Goal: Blood Pressure in Desired Range  Outcome: Ongoing - Unchanged

## 2021-09-16 NOTE — Unmapped (Signed)
CVAD Liaison - Bleeding CVAD Note    CVAD Liaison Nurse assessed the patient at the bed side. The patient has a(n) triple lumen catheter in left IJ.     Bloody drainage was observed. Manual pressure was held for 10   minutes. Surgicel dressing was applied aseptically. Please call/page CVAD Liaison for any issues with this dressing or questions. Report given to the primary RN.    Thank you for this consult,  Caren Hazy RN, CVAD Liaison    Consult Time  30 minutes (min)

## 2021-09-16 NOTE — Unmapped (Signed)
[facility] Nephrology Continuous Renal Replacement Therapy Procedure Note     09/16/2021    Miguel Ward was seen and examined on CRRT    CHIEF COMPLAINT: Acute Kidney Disease    INTERVAL HISTORY: tolerating CRRT. Not on vasopressors. Bedside ICU team notes he has had bloody oozing from his left sided triple lumen central line (not from dialysis catheter). Patient reports he is comfortable.     CURRENT DIALYSIS PRESCRIPTION:  Device: CRRT Device: NxStage  Therapy fluid: Therapy Fluid : NxStage RFP 401 - Contains 4 mEq/L KCL  Therapy fluid rate: Therapy Fluid Rate (L/hr): 1.7 L/hr  Blood flow rate: Blood Pump Rate (mL/min): 300 mL/min  Fluid removal rate: Hourly Fluid Removal Rate (mL/hr): 150 mL/hr    PHYSICAL EXAM:  Vitals:  Temp:  [36.1 ??C (97 ??F)-36.4 ??C (97.6 ??F)] 36.4 ??C (97.5 ??F)  Heart Rate:  [84-113]  106  BP: (109-146)/(40-86) 123/57  MAP (mmHg):  [68-96] 78    Intake/Output Summary (Last 24 hours) at 09/16/2021 1044  Last data filed at 09/16/2021 1000  Gross per 24 hour   Intake 1560 ml   Output 3432 ml   Net -1872 ml      Weights:  Admission Weight: 81.6 kg (180 lb)  Last documented Weight: 71.7 kg (158 lb 1.1 oz)    Assessment:   General: appearing fatigued no acute distress, oozy and bloody dressing on left neck  Pulmonary: coarse breath sounds  Cardiovascular: tachycardic  Extremities: 1+ edema  Access: Right IJ non-tunneled catheter site with blood over dressing from left side of neck--no apparent hemorrhage from the dialysis catheter.     LAB DATA:  Lab Results   Component Value Date    NA 138 09/16/2021    K 4.7 (H) 09/16/2021    CL 105 09/16/2021    CO2 28.0 09/16/2021    BUN 41 (H) 09/16/2021    CREATININE 1.44 (H) 09/16/2021    CALCIUM 8.7 09/16/2021    MG 1.9 09/16/2021    PHOS 2.1 (L) 09/16/2021    ALBUMIN 1.6 (L) 09/16/2021      Lab Results   Component Value Date    HCT 22.6 (L) 09/16/2021    HGB 7.1 (L) 09/16/2021    WBC 23.4 (H) 09/16/2021        ASSESSMENT/PLAN:  Acute Kidney Disease on Continuous Renal Replacement Therapy:  - rinse back and discontinue CRRT, plan for iHD on Thurs 09/17/21.  - Anticoagulation: N/A  - Renal dose all medications    Trevor Iha, DO  [place] [facility] of Nephrology & Hypertension

## 2021-09-16 NOTE — Unmapped (Signed)
WOCN Consult Services                                                 Wound Evaluation    Reason for Consult:   - Follow-up  - Pressure Injury    Problem List:   Principal Problem:    Acute respiratory distress syndrome (ARDS) due to COVID-19 virus (CMS-HCC)  Active Problems:    Hypercholesterolemia    Hypertension    History of kidney transplant    Renal cell carcinoma (CMS-HCC)    Immunosuppression-related infectious disease (CMS-HCC)    Bilateral lower extremity edema    Elevated troponin I level    GERD (gastroesophageal reflux disease)    Diastolic heart failure (CMS-HCC)    Acute on chronic HFrEF (heart failure with reduced ejection fraction) (CMS-HCC)    COVID    ATN (acute tubular necrosis) (CMS-HCC)    Acute renal failure with tubular necrosis (CMS-HCC)    Assessment: Follow up assessment of wound on sacrum concerning for pressure injury. Per EMR, patient admitted on 09/15/2021 with a history of renal transplant from lupus nephritis (complex course over last 15 years since transplant followed here including renal cell) here with acute hypoxic respiratory failure, COVID 19 and shock with AKI. Per H&P, patient found unresponsive, hypoxic and hypoglycemic at home.     Patient assessed in MICU. He is extubated and following commands.    Sacral wound assessed and the epidermal tissue was easily removed with cleansing. Dark outer tissue appears resolved. Vashe soak applied. Dermagran gauze placed over wound and covered with silicone bordered foam dressing.    Heels assessed in addition to sacrum. Heel offloading boots placed. Patient turned with wedges and pillows.        09/16/21 1002   Wound 09/09/21 Pressure Injury Sacrum Right;Mid dark red nonblanching with dark skin surrounding DTI   Date First Assessed/Time First Assessed: 09/09/21 2130   Present on Hospital Admission: Yes  Primary Wound Type: Pressure Injury  Location: Sacrum  Wound Location Orientation: Right;Mid  Wound Description (Comments): dark red nonblanching with dark sk...   Wound Image    Dressing Status      Changed   Wound Length (cm) 3 cm   Wound Width (cm) 3.5 cm   Wound Depth (cm) 0.1 cm   Wound Surface Area (cm^2) 10.5 cm^2   Wound Volume (cm^3) 1.05 cm^3   Wound Healing % 56   Wound Bed Red  (non blanching with dark tissue in the center)   Odor None   Peri-wound Assessment      Intact   Exudate Type      Sero-sanguineous   Exudate Amnt      Small   Tunneling      No   Undermining     No   Treatments Cleansed/Irrigation  (vashe)   Dressing Hydrophilic dressing;Silicone foam bordered dressing           Continence Status:   Incontinence of bladder: Foley in place  Incontinent of bowel: incontinence pad    Moisture Associated Skin Damage:   - none noted     Lab Results   Component Value Date    WBC 23.4 (H) 09/16/2021    HGB 7.1 (L) 09/16/2021    HCT 22.6 (L) 09/16/2021    CRP 317.0 (H) 09/08/2021    A1C  6.3 (H) 07/30/2021    GLUF 92 01/22/2015    GLU 206 (H) 09/16/2021    POCGLU 196 (H) 09/16/2021    ALBUMIN 1.6 (L) 09/16/2021    PROT 4.9 (L) 09/16/2021     Risk Factors:   - Friction/shear  - Immobility  - Lack of sensory perception  - Moisture  - Multiple co-morbidities  - unresponsive prior to admission    Braden Scale Score: 15         Support Surface:   - Low Air Loss - ICU    Type Debridement Completed By WOCN:  N/A    Teaching:  - n/a    WOCN Recommendations:   - See nursing orders for wound care instructions.  - Contact WOCN with questions, concerns, or wound deterioration.  - continue pressure injury prevention interventions and nutrition support.   - add dermagran gauze to wound, continue vashe soak and silicone bordered foam dressing    Topical Therapy/Interventions:   - Antimicrobial Solutions  - Hydrophilic Gauze  - Silicone bordered foam    Recommended Consults:  - Not Applicable    WOCN Follow Up:  - Weekly    Plan of Care Discussed With:   - RN primary    Supplies Ordered: No    Workup Time:   30 minutes   Reconsult for questions/concerns/deterioration.   PAGE VIA  [facility] Health Directory  Available by Epic Chat   Renaye Rakers RN, BSN, CWOCN

## 2021-09-16 NOTE — Unmapped (Signed)
[facility] Nephrology Continuous Renal Replacement Therapy Procedure Note     09/15/2021    [name] was seen and examined on CRRT    CHIEF COMPLAINT: Acute Kidney Disease    INTERVAL HISTORY: tolerating continuous renal replacement. s/p extubation yesterday. no vasopressors x24hrs. urine output 68ml/past 24hrs.    CURRENT DIALYSIS PRESCRIPTION:  Device: CRRT Device: NxStage  Therapy fluid: Therapy Fluid : NxStage RFP 401 - Contains 4 mEq/L KCL  Therapy fluid rate: Therapy Fluid Rate (L/hr): 1.7 L/hr  Blood flow rate: Blood Pump Rate (mL/min): 300 mL/min  Fluid removal rate: Hourly Fluid Removal Rate (mL/hr): 100 mL/hr    PHYSICAL EXAM:  Vitals:  Temp:  [36.1 ??C (97 ??F)-36.4 ??C (97.6 ??F)] 36.4 ??C (97.5 ??F)  Heart Rate:  [84-113]  106  BP: (109-146)/(40-86) 123/57  MAP (mmHg):  [68-96] 78    Intake/Output Summary (Last 24 hours) at 09/15/2021 1736  Last data filed at 09/15/2021 1500  Gross per 24 hour   Intake 1045.48 ml   Output 2167 ml   Net -1121.52 ml      Weights:  Admission Weight: 81.6 kg (180 lb)  Last documented Weight: 71.7 kg (158 lb 1.1 oz)    Assessment:   General: appearing fatigued no acute distress  Pulmonary: coarse breath sounds  Cardiovascular: tachycardic  Extremities: 1+ edema  Access: Right IJ non-tunneled catheter     LAB DATA:  Lab Results   Component Value Date    NA 135 09/15/2021    K 4.8 (H) 09/15/2021    CL 101 09/15/2021    CO2 26.0 09/15/2021    BUN 36 (H) 09/15/2021    CREATININE 1.34 (H) 09/15/2021    CALCIUM 8.6 (L) 09/15/2021    MG 1.7 09/15/2021    PHOS 1.7 (L) 09/15/2021    ALBUMIN 1.6 (L) 09/15/2021      Lab Results   Component Value Date    HCT 24.5 (L) 09/15/2021    HGB 7.8 (L) 09/15/2021    WBC 27.9 (H) 09/15/2021        ASSESSMENT/PLAN:  Acute Kidney Disease on Continuous Renal Replacement Therapy:  - UF goal: 100-158mL/hr as tolerated to help with volume status. plan no crrt restart with transition to intermittent hemodialysis prn.  - Anticoagulation: N/A  - Renal dose all medications    Prince Rome, MD  [facility] Division of Nephrology & Hypertension

## 2021-09-16 NOTE — Unmapped (Signed)
CVAD Liaison - Bleeding CVAD Note    CVAD Liaison Nurse assessed the patient at the bed side. The patient has a(n) triple lumen catheter in left IJ.     Bloody drainage was observed. Manual pressure was held for 10 minutes. Statseal and surgicel dressings were applied aseptically. Please call/page CVAD Liaison for any issues with this dressing or questions. Report given to the primary RN.    Thank you for this consult,  Shelva Majestic Blen Ransome RN, CVAD Liaison    Consult Time  30 minutes (min)

## 2021-09-16 NOTE — Unmapped (Signed)
IMMUNOCOMPROMISED HOST INFECTIOUS DISEASE PROGRESS NOTE    Assessment/Plan:     Miguel Ward is a 70 y.o. 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 COVID tests negative x 3 here. Imaging and severity suggested alternative infection vs. superinfection, and thus patient started on broad-spectrum coverage with vancomycin/cefepime -> cefepime with some improvement.    Found to have legionellosis (Ag and PCR) and started on levofloxacin as well. Respiratory status improved, however progress in hemodynamic and mental status somewhat stagnated bringing up concern for other untreated etiology. CT chest with significant consolidations throughout R lung - appreciate team assistance with bronchoscopy, BAL cx unremarkable. LFTs, particularly alk-phos, have also been increasing despite negative imaging. Patient has since been extubated and no longer requiring CRRT with improved mental status.  We recommend continuing levofloxacin for now, extending therapy to 14 days.        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 Legionella Ag positive, pt also at risk for other bacterial infection or other pathogens (fungi, PJP, CMV)  - 09/09/2021 found at home minimally responsive, brought to ED, intubated  - 08/23/2021 NP and tracheal specimens negative for SARS-CoV2  - 09/08/21 urine legionella antigen positive. PCR of tracheal aspirate positive  - 09/11/21 CT chest extensive mass-like consolidations with air bronchograms throughout R lung, some in LUL as well  - 09/11/21 Increasing WBC, LFTs (particularly ALP and bilirubin); Bcx NGTD  - 09/11/21 Bronch/BAL: Negative mycoplasma, chlamydiophila, PJP, EBV, CMV, RPP. Cx with 30K probable CoNS   Antibiotics  - Current   Levofloxacin 9/26 -   - Prior   Vanc/CFP/levofloxacin 9/29   cefepime/levofloxacin 9/27   added levofloxacin 9/26   Vancomycin/cefepime 9/25    #Increasing ALP and ALT/AST  - 09/10/2021 admission with mildly elevated Tbili/ALP and AST (though with elevated CK)  - 09/10/21 RUQ [place] no abnormalities, no doppler done  - 09/13/21 ALP increasing steadily up to 628. Tbili stabilized, AST/ALT increasing as well  - 09/14/21 HBsAb reactive (21.74); HBcAB non-reactive; HBc IgM non-reactive  - 09/16/21 ALP increased to 957, T Bili 1, AST decreased to 259, ALT increased to 209  - Along with WBC increase, somewhat concerning for drug reaction.  Minimal suspicion CFP or vancomycin, not impossible but rare with levofloxacin    #COVID antigen positive at home 09/05/21  Three negative PCRs while inpatient. Unclear if implies false positive, perhaps resolving infection from weeks prior  - No remdesivir received     Prior infections:  #Fever and leukocytosis, possible UTI 08/2016  #Liver lesions suspected to be abscesses  - Suspected UTI, improved on empiric broad spectrum therapy Vanc/CFP -> doxy  - CT new liver lesions, FDG avid on PET, concerning for metastases  - Liver biopsy 09/2016 nonspecific inflammatory changes suggesting resolving abscess  - MRI 01/2017 improved liver lesions     #RSV infection 02/2018  - Also diagnosed with decompensated diastolic and systolic HF     Antimicrobial Intolerance/allergy  Penicillins - hives (2014), tolerated cephalosporins       RECOMMENDATIONS    Diagnostic  ?? Please f/u: histo Ag  ?? Consider adding on GGT, CK  ?? Follow up final Bcx 9/29  ?? Follow up BAL cx: aerobic/anaerobic, fungal, AFB, Asp Ag, actinomyces  ?? CTM LFTs    Lab monitoring for antimicrobial toxicities  ?? CBC w/ diff and CMP at least weekly    Treatment  Multifocal pneumonia, Legionella antigen positive  ?? Continue Levofloxacin  ?? Duration 14 days (through 09/21/21)    Mixed cholestatic/hepatocellular liver injury  ?? Consider holding other potentially hepatotoxic medications, such as statin  ?? If continued worsening of WBC and LFTs, may need to consider alternative therapy to quinolone      Immunosuppression and prophylaxis  ?? IS deferred to transplant team  ?? Cont ppx with acyclovir           The [facility] ID APP service will continue to follow.  Please page Darolyn Rua, NP at [phone] from 8-4:30pm, after 4:30 pm page the ID Transplant/Liquid Oncology Fellow consult at [phone] with questions.    Patient discussed with Dr. Raina Mina, MSN, APRN, AGNP-C  Immunocompromised Infectious Disease Nurse Practitioner    Subjective:     Interval History:    History obtained from:patient and nurse.  Events since last encounter: None  Today's HPI for primary problems: Continues on 2-3 L [place]. Alert and responsive this AM. No pressors. Off CRRT with plan for iHD on Thursday. Afebrile overnight. Denies pain. No complaints. Medications:  Antimicrobials:  Anti-infectives (From admission, onward)    None          Prior/Current immunomodulators:  Tacrolimus 1/1  MMF None currently  Prednisone 15    Other medications reviewed.    Objective:     Vital Signs last 24 hours:  Temp:  [36.2 ??C (97.2 ??F)-37.2 ??C (99 ??F)] 37.2 ??C (99 ??F)  Heart Rate:  [73-117] 78  SpO2 Pulse:  [87-122] 88  Resp:  [13-27] 21  BP: (98-159)/(36-88) 128/57  MAP (mmHg):  [58-106] 80  FiO2 (%):  [32 %] 32 %  SpO2:  [90 %-100 %] 99 %    Physical Exam:   Patient Lines/Drains/Airways Status     Active Active Lines, Drains, & Airways     Name Placement date Placement time Site Days    Hemodialysis Catheter With Distal Infusion Port 09/08/21 Right Internal jugular 09/08/21  1600  Internal jugular  8    NG/OG Tube Feedings Left nostril 09/14/21  1500  Left nostril  2    Urethral Catheter Temperature probe 16 Fr. 08/20/2021  1602  Temperature probe  9    Arteriovenous Fistula -  Vein Graft  Access 11/01/18 Right;Upper Arm 11/01/18  --  Arm  1050              Const [x]  vital signs above    []  NAD, non-toxic appearance  Lying comfortably      Eyes [x]  Lids normal bilaterally, conjunctiva anicteric and noninjected OU     [] PERRL  [] EOMI     ENMT [x]  Normal appearance of external nose and ears, no nasal discharge        [x]  MMM, no lesions on lips or gums [x]  No thrush, leukoplakia, oral lesions  []  Dentition good []  Edentulous []  Dental caries present  []  Hearing normal  []  TMs with good light reflexes bilaterally   [place]      Neck [x]  Neck of normal appearance and trachea midline        []  No thyromegaly, nodules, or tenderness   []  Full neck ROM  RIJ trialysis catheter in place, no bleeding, erythema. LIJ catheter with blood around area      Lymph [x]  No LAD in neck     []  No LAD in supraclavicular area     []  No LAD in axillae   []  No LAD in epitrochlear chains     []  No LAD in inguinal areas        CV [x]  RRR            [x]  No peripheral edema     []  Pedal pulses intact   []  No abnormal heart sounds appreciated   []  Extremities WWP         Resp [x]  Normal WOB at rest    []  No breathlessness with speaking, no coughing  []  CTA anteriorly    []  CTA bilaterally    Slight crackles throughout R>L; productive cough      GI [x]  Normal inspection, NTND   []  NABS     []  No umbilical hernia on exam       []  No hepatosplenomegaly     []  Inspection of perineal and perianal areas normal  Transplanted kidney palpable. No abdominal pain.      GU []  Normal external genitalia     [] No urinary catheter present in urethra   []  No CVA tenderness    []  No tenderness over renal allograft  Foley present      MSK [x]  No clubbing or cyanosis of hands       []  No vertebral point tenderness  []  No focal tenderness or abnormalities on palpation of joints in RUE, LUE, RLE, or LLE        Skin [x]  No rashes, lesions, or ulcers of visualized skin     []  Skin warm and dry to palpation   RUE with AVG with thrill, dilated up to shoulder. LUE with graft without thrill           Neuro _x Face expression symmetric  []  Sensation to light touch grossly intact throughout    []  Moves extremities equally    []  No tremor noted        []  CNs II-XII grossly intact     []  DTRs normal and symmetric throughout []  Gait unremarkable  Raspy voice but responds in 1-2 words appropriately, following commands; moving all extremities      Psych [x]  Appropriate affect       []  Fluent speech         [x]  Attentive, good eye contact  []  Oriented to person,  place, time          []  Judgment and insight are appropriate   Answering appropriately          Data for Medical Decision Making     Recent Labs   Lab Units 09/16/21  1548 09/16/21  1202 09/16/21  0756 09/16/21  0308   WBC 10*9/L  --  22.3*  --  23.4*   HEMOGLOBIN g/dL  --  6.6*  --  7.1*   PLATELET COUNT (1) 10*9/L  --  76*  --  77*   NEUTRO ABS 10*9/L  --   --   --  21.2*   LYMPHO ABS 10*9/L  --   --   --  0.8*   EOSINO ABS 10*9/L  --   --   --  0.1   BUN mg/dL  --  45*  --  41* CREATININE mg/dL  --  0.34*  --  7.42*   AST U/L  --   --   --  259*   ALT U/L  --   --   --  209*   BILIRUBIN TOTAL mg/dL  --   --   --  1.0   ALK PHOS U/L  --   --   --  957*   POTASSIUM WHOLE BLOOD mmol/L 5.4*  --    < >  --    POTASSIUM mmol/L  --  5.2*  --  4.8   MAGNESIUM mg/dL  --  1.8  --  1.9   PHOSPHORUS mg/dL  --  1.4*  --  2.1*   CALCIUM mg/dL  --  8.5*  --  8.7   CK TOTAL U/L  --   --   --  94.0    < > = values in this interval not displayed.     I reviewed and noted the following labs: WBC 22.3, Hgb 6.6 (plan for 1 unit pRBC today), Plt 76 (stable), Cr 1.44 (off CRRT), ALP 957 (stable)    Microbiology:    9/25 Bcx: no growth  9/25 Ucx no growth, UA 3 WBC  9/25 Lower resp cx: OPF  9/25 SARS-CoV-2 negative for NP and trach aspirate  9/25 CrAg negative  9/26 RPP negative  9/26 Legionella Ag (urine) positive  9/26 Legionella PCR positive  9/26 CMV PCR negative  9/26 Fungitell negative  9/29 BAL cx OPF, 30K probably CoNS  9/29 BAL AFB smear negative, Bcx NGTD; Fungal cx NGTD  9/29 PJP smear negative    Past cultures were reviewed in Epic and CareEverywhere    Imaging:    09/16/21 - US Abdomen Limited    IMPRESSION:  -Cholelithiasis without sonographic evidence of acute cholecystitis.  -Right pleural effusion and ascites seen on prior US      Additional Studies:   (09/15/21) EKG [id number]

## 2021-09-16 NOTE — Unmapped (Signed)
[facility] Evening Summary     Date of Service: 09/15/2021    Interval History: Miguel Ward is a 70 y.o. male with PMHx HTN, 2009 renal transplant (no longer on ??iHD), CAD, HLD, HFrEF, NSTEMI, and lupus nephritis presented 9/25 for AMS. When EMS arrived, he was fully unresponsive, and hypoglycemic. ??He was given 250 mL of D10, and became semi conscious. He still only responded to pain and verbal, and was started on levo for SBP 90s.On arrival to ED he was intubated for airway protection and hypoxia. CXR c/f PNA . Critical care services are indicated for:    Principal Problem:    Acute respiratory distress syndrome (ARDS) due to COVID-19 virus (CMS-HCC)  Active Problems:    Hypercholesterolemia    Hypertension    History of kidney transplant    Renal cell carcinoma (CMS-HCC)    Immunosuppression-related infectious disease (CMS-HCC)    Bilateral lower extremity edema    Elevated troponin I level    GERD (gastroesophageal reflux disease)    Diastolic heart failure (CMS-HCC)    Acute on chronic HFrEF (heart failure with reduced ejection fraction) (CMS-HCC)    COVID    ATN (acute tubular necrosis) (CMS-HCC)    Acute renal failure with tubular necrosis (CMS-HCC)  Resolved Problems:    * No resolved hospital problems. *        Assessment & Plan       N: CT head 9/29 neg, pt/ot, more alert interactive today, appropriate   P: extubated to [place] 10/2, aggressive pulm toilet for airway clearance   CV: levo (tol levo at 8 for inc UF), SDS off 10/1, echo 9/26 EF 30-35% dec RV fxn, mod TR phtn, dd3, afib rate controlled. Off pressors 10/2   GI: TF increased bili. ??RUQ [place]: moderate vol ascites? hepatitis/cirrhosis, liver US doppler: 10/2 ??moderate volu ascites, Mildly sluggish flow in the right portal vein   R: baseline Cr 3, CRRT UF 150, plan to stop CRRT when clots or stops   H: fem line- bleed after removal>>groin site benign. , heparin ppx ??   E: NAI   ID: repeat covid (-x3), , levaquin for Legionnaires's + thru to 10/5 per ICID. Stopped vanc/cefepime per ICID 9/25-10/3., f/u hep B studies      Immuno: tac 1q12h. ??Hold cellcept     Critical Care Attestation     This patient is critically ill or injured with the impairment of vital organ systems such that there is a high probability of imminent or life threatening deterioration in the patient's condition. This patient must remain in the ICU for ongoing evaluation of the comprehensive management plan outlined in this note. I directly provided critical care services as documented in this note and the critical care time spent (45  min) is exclusive of separately billable procedures.  In addition to time spent for critical care management, I also provided advance care planning services for 0  minutes (see ACP note for details)???. Total billable critical care time 45  minutes.    Nena Hampe Willow Ora, ACNP

## 2021-09-16 NOTE — Unmapped (Signed)
MICU Daily Progress Note     Date of Service: 09/16/2021    Problem List:   Principal Problem:    Acute respiratory distress syndrome (ARDS) due to COVID-19 virus (CMS-HCC)  Active Problems:    Hypercholesterolemia    Hypertension    History of kidney transplant    Renal cell carcinoma (CMS-HCC)    Immunosuppression-related infectious disease (CMS-HCC)    Bilateral lower extremity edema    Elevated troponin I level    GERD (gastroesophageal reflux disease)    Diastolic heart failure (CMS-HCC)    Acute on chronic HFrEF (heart failure with reduced ejection fraction) (CMS-HCC)    COVID    ATN (acute tubular necrosis) (CMS-HCC)    Acute renal failure with tubular necrosis (CMS-HCC)  Resolved Problems:    * No resolved hospital problems. *      HPI: Miguel Ward is a 70 y.o. male renal transplant from lupus nephritis (complex course over last 15 years since transplant followed here including renal cell) here with acute hypoxic respiratory failure, COVID 19 and shock with AKI.    24hr events:  - no acute events  - Bleeding from line site     Neurological   #acute metabolic encephalopathy  - analegsia prn  - CT head 9/29 no acute findings  - neuro exam improving >> sustained wakefulness and interaction, non focal. Remains intermittently drowsy     Pulmonary   #ARDS, Acute resp failure, resolving   - intubated 9/25 on minimal settings, passing SBTs yet precluded extubation for mentation  - bronched 9/29 secretions blood tinged in trach and mainstem airways, none seen in distal airways  - passed SBT w improved wakefulness and ability to clear cough, extubated to [place] on 10/2  - tolerating [place] well       Cardiovascular   #septic shock, elevated trop  #HFrEF, ho htn, hld, cad  - formal echo 9/26 EF @ 30%, 2020 ECHO 40-45%  - cont home regimen asa statin  - hold home regimen metop, vasotec, lasix  - SDS 9/26--> 10/1  - cont low dose levo for MAP>65, off since 10/2    Renal   #AKI (baseline Cr 2.7-3), AGMA likely ATN 2/2 sepsis and hypotension  #hx renal transplant s/t lupus nephritis, c/b rejection and renal cell CA s/p cryo ablation  - foley for accurate I/O during critical illness   - worsening renal fxn and CRRT started 9/26.   - continue tac with goal of 2-4   - cont home prednisone at a higher dose 15 mg while acutely sick to be taper back down to home dose of 5 mg  - continue to hold cellcept--> will discuss w/ neph today    - cont magox and calcitriol  - replete electrolytes prn  - Increased UF today, plan for a no restart given he is off pressors     Infectious Disease/Autoimmune   #COVID 19 infection  Symptom onset:??9/22  Exposure: unknown  COVID PCR+: neg x 3  (home test positive 9/23?)  Day of admission/transfer: 9/25  COVID Specific??Meds:??none given   Vaccination status: full & boosted x once, evushield given 8/17  3 PCRs since admission negative and taken off precautions   ??  9/25 Bcx neg   9/25 Ucx pending, UA 3 WBC  9/25 Lower resp cx, pending, OPF  9/25 SARS-CoV-2 negative for NP and trach aspirate  9/25 CrAg negative  9/26 RPP negative  9/26 Legionella Ag (urine) positive  9/26 Legionella PCR  positive  9/26 CMV PCR negative  ??  Multifocal PNA:   - on vanco/cefepime on 9/25, stopped 10/3 per ICID  - MRSA screen neg and vanco stopped 9/27, restarted 9/29 in the setting of worsening pressor requirements and GPC seen on BAL from Bronch on 9/29  - CT Chest 9/29 c/w volume overload, pna and rt pleural effusion      Legionella, PNA   - levofloxacin started 9/26, 10-14 day course (til 10/5 at least) per ID  ??  Cultures:  Blood Culture, Routine (no units)   Date Value   09/11/2021 No Growth at 4 days     Urine Culture, Comprehensive (no units)   Date Value   2021-09-09 NO GROWTH     Lower Respiratory Culture (no units)   Date Value   09/11/2021 Specimen Not Processed   2021/09/09 OROPHARYNGEAL FLORA ISOLATED     WBC (10*9/L)   Date Value   09/16/2021 23.4 (H)     WBC, UA (/HPF)   Date Value   09-09-21 <1        FEN/GI #gerd  -  TF tol well at goal  - ppi for gi ppx  - bowel regimen held for loose stools    Transaminitis and Elevated bilirubin: overall all uptrending, [place] RUQ didn't demonstrated acute cholecystitis.  - continue to trend LFTs  - potential need for HIDA if continues to increase holding off given bilirubin downtrending   - will repeat US today. Also stopped statin and abx in the past 24hrs so will monitor     Malnutrition Assessment: Not done yet.  Body mass index is 22.68 kg/m??.        Heme/Coag   Rt groin bleeding s/p line removal, resolved  - was on heparin gtt for NSTEMI and afib- stopped 9/27  - continue SQH for DVT ppx  - HH, plts, coags acceptable  - no evid active bleeding  - SCDs for DVT ppx  - last transfused 9/27    Endocrine   #hypoglycemic, hypothryoid  - glucose within target range yes  - cont ADI + accu checks   - hold home regimen allopurinol  - cont home regimen synthroid    Integumentary   #NAI  - WOCN consulted for high risk skin assessment No. Reason: no alterations in skin.  - WOCN recs >> agree with assmt and plan 9/30  - cont pressure mitigating precautions per skin policy    Prophylaxis/LDA/Restraints/Consults   Can CVC be removed? No: dialysis catheter   Can A-line be removed? No: >moderate dose pressors  Can Foley be removed? No: Need continuous I/O  Mobility plan: Step 2 - Head of bed elevation (>60 degrees)    Feeding: Tube feeds at goal  Analgesia: Pain adequately controlled  Sedation SAT/SBT: Yes  Thromboembolic ppx: SQ heparin  Head of bed >30 degrees: Yes  Ulcer ppx: Yes, home use continued  Glucose within target range: Yes, in range    Does patient need/have an active type/screen? Yes    RASS at goal? No - adjusting sedation or order to reflect need  [place] Agitation Assessment Scale (RASS) : 0 (09/16/2021  8:00 AM)     Can antipsychotics be stopped? N/A, not on antipsychotics  CAM-ICU Result: Positive (09/10/2021  8:00 PM)      Would hospice care be appropriate for this patient? No, patient improving or expected to improve  Any unaddressed hospice/palliative care needs? no    Patient Lines/Drains/Airways Status  Active Active Lines, Drains, & Airways     Name Placement date Placement time Site Days    CVC Triple Lumen 09/13/21 Non-tunneled Left Internal jugular 09/13/21  1848  Internal jugular  2    Hemodialysis Catheter With Distal Infusion Port 09/08/21 Right Internal jugular 09/08/21  1600  Internal jugular  7    NG/OG Tube Feedings Left nostril 09/14/21  1500  Left nostril  1    Urethral Catheter Temperature probe 16 Fr. 09-27-21  1602  Temperature probe  8    Arteriovenous Fistula - Vein Graft  Access 11/01/18 Right;Upper Arm 11/01/18  --  Arm  1050              Patient Lines/Drains/Airways Status     Active Wounds     Name Placement date Placement time Site Days    Wound 09/09/21 Pressure Injury Sacrum Right;Mid dark red nonblanching with dark skin surrounding DTI 09/09/21  2130  Sacrum  6                Goals of Care     Code Status: Full Code    Designated Healthcare Decision Maker:  Mr. Cassedy current decisional capacity for healthcare decision-making is Full capacity. His designated healthcare decision maker(s) is/are   HCDM (patient stated preference): Davidovich,Katherine - Spouse - [phone].    Subjective   Sleepy, but awakes to voice and responds appropriately     Objective     Vitals - past 24 hours  Temp:  [36.5 ??C (97.7 ??F)-36.6 ??C (97.9 ??F)] 36.6 ??C (97.9 ??F)  Heart Rate:  [86-117] 97  SpO2 Pulse:  [87-122] 88  Resp:  [13-21] 19  BP: (104-132)/(36-69) 110/36  FiO2 (%):  [32 %] 32 %  SpO2:  [96 %-100 %] 100 % Intake/Output  I/O last 3 completed shifts:  In: 2515 [I.V.:100; NG/GT:2085; IV Piggyback:330]  Out: 4161 [Urine:78; Other:4083]     Physical Exam:    General: thin elderly male, in NAD   HEENT: PERRL, slightly icteric sclera w edema  CV: RRR, no m/r/g, 2+ BLE edema   Pulm: diminished throughout, scattered rhonchi, dec bases  GI: abd round, non tender  MSK: grossly intact, tone intact  Skin: grossly intact, warm  Neuro: FSC, CN grossly intact, non focal motor, BUE 2/5, BLE 2/5       Continuous Infusions:   ??? NxStage RFP 400 (+/- BB) 5000 mL - contains 2 mEq/L of potassium     ??? NxStage/multiBic RFP 401 (+/- BB) 5000 mL - contains 4 mEq/L of potassium         Scheduled Medications:   ??? aspirin  81 mg Enteral tube: gastric  Daily   ??? calcitRIOL  0.25 mcg Enteral tube: gastric  Q MWF   ??? cellulose, oxidized reg 1X 2 pad   Topical Once   ??? esomeprazole  40 mg Enteral tube: gastric  daily   ??? heparin (porcine)  5,000 Units Subcutaneous [facility]   ??? flu vacc qs2022-23 6mos up(PF)  0.5 mL Intramuscular During hospitalization   ??? insulin regular  0-20 Units Subcutaneous Q6H SCH   ??? insulin regular  0-20 Units Subcutaneous Q6H SCH   ??? levothyroxine  250 mcg Enteral tube: gastric  daily   ??? magnesium oxide  400 mg Enteral tube: gastric  BID   ??? predniSONE  15 mg Oral Daily   ??? Tacrolimus  1 mg Enteral tube: gastric  BID   ??? white petrolatum-mineral oil  1 application Both Eyes BID       PRN medications:  dextrose in water, dextrose in water, glucagon, glucose, heparin (porcine), heparin (porcine), ondansetron    Data/Imaging Review: Reviewed in Epic and personally interpreted on 09/16/2021. See EMR for detailed results.         Critical Care Attestation     This patient is critically ill or injured with the impairment of vital organ systems such that there is a high probability of imminent or life threatening deterioration in the patient's condition. This patient must remain in the ICU for ongoing evaluation of the comprehensive management plan outlined in this note. I directly provided critical care services as documented in this note and the critical care time spent (50  min) is exclusive of separately billable procedures.  In addition to time spent for critical care management, I also provided advance care planning services for 0  minutes (see ACP note for details)???. Total billable critical care time 50  minutes.    Daryel November, ACNP

## 2021-09-17 LAB — CBC
HEMATOCRIT: 20.7 % — ABNORMAL LOW (ref 39.0–48.0)
HEMATOCRIT: 23.4 % — ABNORMAL LOW (ref 39.0–48.0)
HEMOGLOBIN: 6.6 g/dL — ABNORMAL LOW (ref 12.9–16.5)
HEMOGLOBIN: 7.5 g/dL — ABNORMAL LOW (ref 12.9–16.5)
MEAN CORPUSCULAR HEMOGLOBIN CONC: 32.1 g/dL (ref 32.0–36.0)
MEAN CORPUSCULAR HEMOGLOBIN CONC: 31.9 g/dL — ABNORMAL LOW (ref 32.0–36.0)
MEAN CORPUSCULAR HEMOGLOBIN: 27.7 pg (ref 25.9–32.4)
MEAN CORPUSCULAR HEMOGLOBIN: 27.5 pg (ref 25.9–32.4)
MEAN CORPUSCULAR VOLUME: 86.3 fL (ref 77.6–95.7)
MEAN CORPUSCULAR VOLUME: 86.2 fL (ref 77.6–95.7)
MEAN PLATELET VOLUME: 10.2 fL (ref 6.8–10.7)
MEAN PLATELET VOLUME: 11.3 fL — ABNORMAL HIGH (ref 6.8–10.7)
PLATELET COUNT: 81 10*9/L — ABNORMAL LOW (ref 150–450)
PLATELET COUNT: 90 10*9/L — ABNORMAL LOW (ref 150–450)
RED BLOOD CELL COUNT: 2.4 10*12/L — ABNORMAL LOW (ref 4.26–5.60)
RED BLOOD CELL COUNT: 2.72 10*12/L — ABNORMAL LOW (ref 4.26–5.60)
RED CELL DISTRIBUTION WIDTH: 15.6 % — ABNORMAL HIGH (ref 12.2–15.2)
RED CELL DISTRIBUTION WIDTH: 15.9 % — ABNORMAL HIGH (ref 12.2–15.2)
WBC ADJUSTED: 18.2 10*9/L — ABNORMAL HIGH (ref 3.6–11.2)
WBC ADJUSTED: 18.3 10*9/L — ABNORMAL HIGH (ref 3.6–11.2)

## 2021-09-17 LAB — BLOOD GAS CRITICAL CARE PANEL, VENOUS
BASE EXCESS VENOUS: 2.3 — ABNORMAL HIGH (ref -2.0–2.0)
BASE EXCESS VENOUS: 2.4 — ABNORMAL HIGH (ref -2.0–2.0)
BASE EXCESS VENOUS: 3.6 — ABNORMAL HIGH (ref -2.0–2.0)
CALCIUM IONIZED VENOUS (MG/DL): 4.94 mg/dL (ref 4.40–5.40)
CALCIUM IONIZED VENOUS (MG/DL): 5.25 mg/dL (ref 4.40–5.40)
CALCIUM IONIZED VENOUS (MG/DL): 5.3 mg/dL (ref 4.40–5.40)
GLUCOSE WHOLE BLOOD: 170 mg/dL (ref 70–179)
GLUCOSE WHOLE BLOOD: 290 mg/dL — ABNORMAL HIGH (ref 70–179)
GLUCOSE WHOLE BLOOD: 388 mg/dL — ABNORMAL HIGH (ref 70–179)
HCO3 VENOUS: 27 mmol/L (ref 22–27)
HCO3 VENOUS: 28 mmol/L — ABNORMAL HIGH (ref 22–27)
HCO3 VENOUS: 28 mmol/L — ABNORMAL HIGH (ref 22–27)
HEMOGLOBIN BLOOD GAS: 10.2 g/dL — ABNORMAL LOW (ref 13.50–17.50)
HEMOGLOBIN BLOOD GAS: 7.1 g/dL — ABNORMAL LOW
HEMOGLOBIN BLOOD GAS: 7.9 g/dL — ABNORMAL LOW
LACTATE BLOOD VENOUS: 1.6 mmol/L (ref 0.5–1.8)
LACTATE BLOOD VENOUS: 1.7 mmol/L (ref 0.5–1.8)
LACTATE BLOOD VENOUS: 1.9 mmol/L — ABNORMAL HIGH (ref 0.5–1.8)
O2 SATURATION VENOUS: 72.4 % (ref 40.0–85.0)
O2 SATURATION VENOUS: 76.3 % (ref 40.0–85.0)
O2 SATURATION VENOUS: 77.8 % (ref 40.0–85.0)
PCO2 VENOUS: 42 mmHg (ref 40–60)
PCO2 VENOUS: 44 mmHg (ref 40–60)
PCO2 VENOUS: 47 mmHg (ref 40–60)
PH VENOUS: 7.39 (ref 7.32–7.43)
PH VENOUS: 7.4 (ref 7.32–7.43)
PH VENOUS: 7.43 (ref 7.32–7.43)
PO2 VENOUS: 38 mmHg (ref 30–55)
PO2 VENOUS: 40 mmHg (ref 30–55)
PO2 VENOUS: 42 mmHg (ref 30–55)
POTASSIUM WHOLE BLOOD: 5.4 mmol/L — ABNORMAL HIGH (ref 3.4–4.6)
POTASSIUM WHOLE BLOOD: 5.8 mmol/L — ABNORMAL HIGH (ref 3.4–4.6)
POTASSIUM WHOLE BLOOD: 6 mmol/L (ref 3.4–4.6)
SODIUM WHOLE BLOOD: 137 mmol/L (ref 135–145)
SODIUM WHOLE BLOOD: 139 mmol/L (ref 135–145)
SODIUM WHOLE BLOOD: 140 mmol/L (ref 135–145)

## 2021-09-17 LAB — CBC W/ AUTO DIFF
BASOPHILS ABSOLUTE COUNT: 0 10*9/L (ref 0.0–0.1)
BASOPHILS RELATIVE PERCENT: 0.2 %
EOSINOPHILS ABSOLUTE COUNT: 0 10*9/L (ref 0.0–0.5)
EOSINOPHILS RELATIVE PERCENT: 0.3 %
HEMATOCRIT: 21.5 % — ABNORMAL LOW (ref 39.0–48.0)
HEMOGLOBIN: 7 g/dL — ABNORMAL LOW (ref 12.9–16.5)
LYMPHOCYTES ABSOLUTE COUNT: 1.1 10*9/L (ref 1.1–3.6)
LYMPHOCYTES RELATIVE PERCENT: 6.1 %
MEAN CORPUSCULAR HEMOGLOBIN CONC: 32.5 g/dL (ref 32.0–36.0)
MEAN CORPUSCULAR HEMOGLOBIN: 27.6 pg (ref 25.9–32.4)
MEAN CORPUSCULAR VOLUME: 85 fL (ref 77.6–95.7)
MEAN PLATELET VOLUME: 10.6 fL (ref 6.8–10.7)
MONOCYTES ABSOLUTE COUNT: 1 10*9/L — ABNORMAL HIGH (ref 0.3–0.8)
MONOCYTES RELATIVE PERCENT: 5.6 %
NEUTROPHILS ABSOLUTE COUNT: 15.5 10*9/L — ABNORMAL HIGH (ref 1.8–7.8)
NEUTROPHILS RELATIVE PERCENT: 87.8 %
PLATELET COUNT: 79 10*9/L — ABNORMAL LOW (ref 150–450)
RED BLOOD CELL COUNT: 2.53 10*12/L — ABNORMAL LOW (ref 4.26–5.60)
RED CELL DISTRIBUTION WIDTH: 15.8 % — ABNORMAL HIGH (ref 12.2–15.2)
WBC ADJUSTED: 17.7 10*9/L — ABNORMAL HIGH (ref 3.6–11.2)

## 2021-09-17 LAB — BASIC METABOLIC PANEL
ANION GAP: 5 mmol/L (ref 5–14)
ANION GAP: 6 mmol/L (ref 5–14)
ANION GAP: 6 mmol/L (ref 5–14)
ANION GAP: 5 mmol/L (ref 5–14)
BLOOD UREA NITROGEN: 64 mg/dL — ABNORMAL HIGH (ref 9–23)
BLOOD UREA NITROGEN: 79 mg/dL — ABNORMAL HIGH (ref 9–23)
BLOOD UREA NITROGEN: 86 mg/dL — ABNORMAL HIGH (ref 9–23)
BLOOD UREA NITROGEN: 94 mg/dL — ABNORMAL HIGH (ref 9–23)
BUN / CREAT RATIO: 30
BUN / CREAT RATIO: 33
BUN / CREAT RATIO: 33
BUN / CREAT RATIO: 35
CALCIUM: 8.6 mg/dL — ABNORMAL LOW (ref 8.7–10.4)
CALCIUM: 8.6 mg/dL — ABNORMAL LOW (ref 8.7–10.4)
CALCIUM: 8.7 mg/dL (ref 8.7–10.4)
CALCIUM: 8.8 mg/dL (ref 8.7–10.4)
CHLORIDE: 106 mmol/L (ref 98–107)
CHLORIDE: 104 mmol/L (ref 98–107)
CHLORIDE: 106 mmol/L (ref 98–107)
CHLORIDE: 106 mmol/L (ref 98–107)
CO2: 27 mmol/L (ref 20.0–31.0)
CO2: 28 mmol/L (ref 20.0–31.0)
CO2: 26 mmol/L (ref 20.0–31.0)
CO2: 27 mmol/L (ref 20.0–31.0)
CREATININE: 2.15 mg/dL — ABNORMAL HIGH
CREATININE: 2.43 mg/dL — ABNORMAL HIGH
CREATININE: 2.58 mg/dL — ABNORMAL HIGH
CREATININE: 2.71 mg/dL — ABNORMAL HIGH
EGFR CKD-EPI (2021) MALE: 33 mL/min/{1.73_m2} — ABNORMAL LOW (ref >=60)
EGFR CKD-EPI (2021) MALE: 28 mL/min/{1.73_m2} — ABNORMAL LOW (ref >=60)
EGFR CKD-EPI (2021) MALE: 26 mL/min/{1.73_m2} — ABNORMAL LOW (ref >=60)
EGFR CKD-EPI (2021) MALE: 25 mL/min/{1.73_m2} — ABNORMAL LOW (ref >=60)
GLUCOSE RANDOM: 253 mg/dL — ABNORMAL HIGH (ref 70–179)
GLUCOSE RANDOM: 340 mg/dL — ABNORMAL HIGH (ref 70–179)
GLUCOSE RANDOM: 390 mg/dL — ABNORMAL HIGH (ref 70–179)
GLUCOSE RANDOM: 178 mg/dL (ref 70–179)
POTASSIUM: 5.5 mmol/L — ABNORMAL HIGH (ref 3.4–4.8)
POTASSIUM: 5.8 mmol/L — ABNORMAL HIGH (ref 3.4–4.8)
POTASSIUM: 6.3 mmol/L (ref 3.4–4.8)
POTASSIUM: 5.8 mmol/L — ABNORMAL HIGH (ref 3.4–4.8)
SODIUM: 138 mmol/L (ref 135–145)
SODIUM: 138 mmol/L (ref 135–145)
SODIUM: 138 mmol/L (ref 135–145)
SODIUM: 138 mmol/L (ref 135–145)

## 2021-09-17 LAB — ENDOTOOL
ENDOTOOL GLUCOSE: 167 mg/dL (ref 140–180)
ENDOTOOL GLUCOSE: 168 mg/dL (ref 140–180)
ENDOTOOL GLUCOSE: 173 mg/dL (ref 140–180)
ENDOTOOL GLUCOSE: 182 mg/dL — ABNORMAL HIGH (ref 140–180)
ENDOTOOL GLUCOSE: 261 mg/dL — ABNORMAL HIGH (ref 140–180)
ENDOTOOL GLUCOSE: 361 mg/dL — ABNORMAL HIGH (ref 140–180)
ENDOTOOL INSULIN RATE: 10.5 U/h
ENDOTOOL INSULIN RATE: 2.6 U/h
ENDOTOOL INSULIN RATE: 2.8 U/h
ENDOTOOL INSULIN RATE: 3.2 U/h
ENDOTOOL INSULIN RATE: 3.4 U/h
ENDOTOOL INSULIN RATE: 7 U/h

## 2021-09-17 LAB — HEPATIC FUNCTION PANEL
ALBUMIN: 1.6 g/dL — ABNORMAL LOW (ref 3.4–5.0)
ALKALINE PHOSPHATASE: 785 U/L — ABNORMAL HIGH (ref 46–116)
ALT (SGPT): 199 U/L — ABNORMAL HIGH (ref 10–49)
AST (SGOT): 218 U/L — ABNORMAL HIGH (ref <=34)
BILIRUBIN DIRECT: 0.6 mg/dL — ABNORMAL HIGH (ref 0.00–0.30)
BILIRUBIN TOTAL: 0.8 mg/dL (ref 0.3–1.2)
PROTEIN TOTAL: 4.7 g/dL — ABNORMAL LOW (ref 5.7–8.2)

## 2021-09-17 LAB — PHOSPHORUS
PHOSPHORUS: 1.8 mg/dL — ABNORMAL LOW (ref 2.4–5.1)
PHOSPHORUS: 2.5 mg/dL (ref 2.4–5.1)
PHOSPHORUS: 3.7 mg/dL (ref 2.4–5.1)

## 2021-09-17 LAB — APTT
APTT: 42.3 s — ABNORMAL HIGH (ref 25.1–36.5)
APTT: 38.5 s — ABNORMAL HIGH (ref 25.1–36.5)
HEPARIN CORRELATION: 0.2
HEPARIN CORRELATION: 0.2

## 2021-09-17 LAB — D-DIMER, QUANTITATIVE
D-DIMER QUANTITATIVE (CW,ML,HL,HS,CH,JS,JC): 1661 ng{FEU}/mL — ABNORMAL HIGH (ref <=500)
D-DIMER QUANTITATIVE (CW,ML,HL,HS,CH,JS,JC): 1964 ng{FEU}/mL — ABNORMAL HIGH (ref <=500)

## 2021-09-17 LAB — PROTIME-INR
INR: 1.31
INR: 1.35
PROTIME: 15 s — ABNORMAL HIGH (ref 9.8–12.8)
PROTIME: 15.4 s — ABNORMAL HIGH (ref 9.8–12.8)

## 2021-09-17 LAB — MAGNESIUM
MAGNESIUM: 2 mg/dL (ref 1.6–2.6)
MAGNESIUM: 2.1 mg/dL (ref 1.6–2.6)
MAGNESIUM: 2.1 mg/dL (ref 1.6–2.6)

## 2021-09-17 LAB — FIBRINOGEN
FIBRINOGEN LEVEL: 333 mg/dL (ref 175–500)
FIBRINOGEN LEVEL: 329 mg/dL (ref 175–500)

## 2021-09-17 LAB — HEPATITIS C ANTIBODY: HEPATITIS C ANTIBODY: NONREACTIVE

## 2021-09-17 LAB — HEPATITIS B SURFACE ANTIGEN: HEPATITIS B SURFACE ANTIGEN: NONREACTIVE

## 2021-09-17 MED ADMIN — insulin regular (HumuLIN,NovoLIN) injection 10 Units: 10 [IU] | INTRAVENOUS | @ 20:00:00 | Stop: 2021-09-17

## 2021-09-17 MED ADMIN — insulin NPH (HumuLIN,NovoLIN) injection 25 Units: 25 [IU] | SUBCUTANEOUS | @ 13:00:00 | Stop: 2021-09-17

## 2021-09-17 MED ADMIN — insulin regular (HumuLIN,NovoLIN) injection 0-20 Units: 0-20 [IU] | SUBCUTANEOUS | @ 16:00:00 | Stop: 2021-09-17

## 2021-09-17 MED ADMIN — magnesium oxide (MAG-OX) tablet 400 mg: 400 mg | GASTROENTERAL | @ 13:00:00

## 2021-09-17 MED ADMIN — white petrolatum-mineral oil (SOOTHE PM) 80-20 % ophthalmic ointment 1 application: 1 | OPHTHALMIC

## 2021-09-17 MED ADMIN — sodium zirconium cyclosilicate (LOKELMA) packet 10 g: 10 g | GASTROENTERAL | @ 19:00:00

## 2021-09-17 MED ADMIN — insulin regular (HumuLIN,NovoLIN) 100 Units in sodium chloride (NS) 0.9 % 100 mL infusion: 0-50 [IU]/h | INTRAVENOUS | @ 23:00:00

## 2021-09-17 MED ADMIN — sodium phosphate 18 mmol in dextrose 5 % 250 mL IVPB: 18 mmol | INTRAVENOUS | @ 13:00:00 | Stop: 2021-09-17

## 2021-09-17 MED ADMIN — heparin (porcine) 5,000 unit/mL injection 5,000 Units: 5000 [IU] | SUBCUTANEOUS | @ 09:00:00

## 2021-09-17 MED ADMIN — tacrolimus (PROGRAF) oral suspension: 1 mg | GASTROENTERAL | @ 15:00:00

## 2021-09-17 MED ADMIN — white petrolatum-mineral oil (SOOTHE PM) 80-20 % ophthalmic ointment 1 application: 1 | OPHTHALMIC | @ 13:00:00

## 2021-09-17 MED ADMIN — insulin regular (HumuLIN,NovoLIN) injection 0-20 Units: 0-20 [IU] | SUBCUTANEOUS | @ 09:00:00 | Stop: 2021-09-17

## 2021-09-17 MED ADMIN — predniSONE (DELTASONE) tablet 15 mg: 15 mg | ORAL | @ 13:00:00 | Stop: 2021-09-17

## 2021-09-17 MED ADMIN — aspirin chewable tablet 81 mg: 81.0 mg | GASTROENTERAL | @ 13:00:00

## 2021-09-17 MED ADMIN — levoFLOXacin (LEVAQUIN) tablet 500 mg: 500 mg | GASTROENTERAL | @ 13:00:00 | Stop: 2021-09-17

## 2021-09-17 MED ADMIN — heparin (porcine) 5,000 unit/mL injection 5,000 Units: 5000 [IU] | SUBCUTANEOUS | @ 19:00:00

## 2021-09-17 MED ADMIN — heparin (porcine) 5,000 unit/mL injection 5,000 Units: 5000 [IU] | SUBCUTANEOUS | @ 02:00:00

## 2021-09-17 MED ADMIN — levothyroxine (SYNTHROID) tablet 250 mcg: 250 ug | GASTROENTERAL | @ 09:00:00

## 2021-09-17 MED ADMIN — esomeprazole (NexIUM) granules 40 mg: 40 mg | GASTROENTERAL | @ 09:00:00

## 2021-09-17 MED ADMIN — insulin regular (HumuLIN,NovoLIN) injection 0-20 Units: 0-20 [IU] | SUBCUTANEOUS | @ 03:00:00

## 2021-09-17 MED ADMIN — tacrolimus (PROGRAF) oral suspension: 1 mg | GASTROENTERAL

## 2021-09-17 MED ADMIN — calcitRIOL (ROCALTROL) oral solution: .25 ug | GASTROENTERAL | @ 13:00:00

## 2021-09-17 MED ADMIN — magnesium oxide (MAG-OX) tablet 400 mg: 400 mg | GASTROENTERAL

## 2021-09-17 MED ADMIN — insulin NPH (HumuLIN,NovoLIN) injection 5 Units: 5 [IU] | SUBCUTANEOUS | @ 04:00:00

## 2021-09-17 NOTE — Unmapped (Signed)
Transplant Nephrology Note      Subjective: CRRT discontinued yesterday. Central line dressings changed and no bloody oozing today. The patient tells me he feels OK other than fatigue--no shortness of breath, no pain, no fever.      Objective:  BP 140/53  - Pulse 95  - Temp 37.1 ??C (98.8 ??F) (Axillary)  - Resp 20  - Ht 177.8 cm (5' 10)  - Wt 71.7 kg (158 lb 1.1 oz)  - SpO2 98%  - BMI 22.68 kg/m??       General: no acute distress, chronically ill appearing  HEENT: mucous membranes dry  Neck: supple, normal range of motion  CV: heart regular rate and rhythm, no murmurs appreciated  Lungs: diminished breath sounds at bases bilaterally, non-labored breathing  Abdomen: soft, non-tender, bowel sounds  Extremities: no edema  Lymphatic: no cervical lymphadenopathy  Neuro/Psych: awake, alert, interactive. No gross focal neurological deficit. Normal Affect.          Laboratory data and medication administration record reviewed in the electronic medical record.      Assessment  69 y.o. male status post deceased donor kidney transplant in 2009 for ESRD secondary to lupus nephritis admitted with acute hypoxic respiratory failure, acute kidney injury, and legionellosis.       Recommendations:    AKI: likely due to sepsis, ATN. Remains oligo-anuric and Cr increased to 2.15 with stopping CRRT. Since hemodynamics have improved, will plan on iHD via RUE AVF on 09/18/21.    Immunosuppression [High risk medical decision making for drug therapy requiring intensive monitoring for toxicity]: Continue tacrolimus at current dose of 1 mg BID. Tacrolimus trough level of 2.5 ng/mL on 09/16/21, which is within goal of 2-4 ng/mL. Can taper prednisone down to 10 mg for the next 3-5 days, then reduce to 5 mg daily. Continue to hold mycophenolate at this time.         Clelia Croft, DO  Division of Nephrology and Hypertension  [facility]  09/17/2021  12:13 PM

## 2021-09-17 NOTE — Unmapped (Signed)
Pt RASS -1. Weak cough/gag. Room air. A fib; stable HR & BP. No BM overnight. Minimal urine output via foley catheter.   Problem: Fall Injury Risk  Goal: Absence of Fall and Fall-Related Injury  Outcome: Ongoing - Unchanged  Intervention: Promote Injury-Free Environment  Recent Flowsheet Documentation  Taken 09/16/2021 2000 by Wanda Plump, RN  Safety Interventions:   aspiration precautions   fall reduction program maintained   bleeding precautions     Problem: Adult Inpatient Plan of Care  Goal: Plan of Care Review  Outcome: Ongoing - Unchanged  Goal: Patient-Specific Goal (Individualized)  Outcome: Ongoing - Unchanged  Goal: Absence of Hospital-Acquired Illness or Injury  Outcome: Ongoing - Unchanged  Intervention: Identify and Manage Fall Risk  Recent Flowsheet Documentation  Taken 09/16/2021 2000 by Wanda Plump, RN  Safety Interventions:   aspiration precautions   fall reduction program maintained   bleeding precautions  Intervention: Prevent Skin Injury  Recent Flowsheet Documentation  Taken 09/16/2021 2000 by Wanda Plump, RN  Skin Protection: adhesive use limited  Intervention: Prevent and Manage VTE (Venous Thromboembolism) Risk  Recent Flowsheet Documentation  Taken 09/16/2021 2000 by Wanda Plump, RN  Activity Management: bedrest  Range of Motion: Bilateral Upper and Lower Extremities  Intervention: Prevent Infection  Recent Flowsheet Documentation  Taken 09/16/2021 2000 by Wanda Plump, RN  Infection Prevention: hand hygiene promoted  Goal: Optimal Comfort and Wellbeing  Outcome: Ongoing - Unchanged  Goal: Rounds/Family Conference  Outcome: Ongoing - Unchanged     Problem: Skin Injury Risk Increased  Goal: Skin Health and Integrity  Outcome: Ongoing - Unchanged  Intervention: Optimize Skin Protection  Recent Flowsheet Documentation  Taken 09/17/2021 0400 by Wanda Plump, RN  Head of Bed [facility]) Positioning: HOB at 30-45 degrees  Taken 09/17/2021 0000 by Wanda Plump, RN  Head of Bed [facility]) Positioning: HOB at 30-45 degrees  Taken 09/16/2021 2000 by Wanda Plump, RN  Pressure Reduction Techniques: frequent weight shift encouraged  Head of Bed (HOB) Positioning: HOB at 30-45 degrees  Pressure Reduction Devices: heel offloading device utilized  Skin Protection: adhesive use limited     Problem: Dysrhythmia (Heart Failure)  Goal: Stable Heart Rate and Rhythm  Outcome: Ongoing - Unchanged     Problem: Fluid Imbalance (Heart Failure)  Goal: Fluid Balance  Outcome: Ongoing - Unchanged     Problem: Nutrition Impairment (Mechanical Ventilation, Invasive)  Goal: Optimal Nutrition Delivery  Outcome: Ongoing - Unchanged     Problem: Impaired Wound Healing  Goal: Optimal Wound Healing  Outcome: Ongoing - Unchanged  Intervention: Promote Wound Healing  Recent Flowsheet Documentation  Taken 09/16/2021 2000 by Wanda Plump, RN  Activity Management: bedrest     Problem: Self-Care Deficit  Goal: Improved Ability to Complete Activities of Daily Living  Outcome: Ongoing - Unchanged     Problem: Diabetes Comorbidity  Goal: Blood Glucose Level Within Targeted Range  Outcome: Ongoing - Unchanged  Intervention: Monitor and Manage Glycemia  Recent Flowsheet Documentation  Taken 09/16/2021 2000 by Wanda Plump, RN  Glycemic Management: blood glucose monitored     Problem: Heart Failure Comorbidity  Goal: Maintenance of Heart Failure Symptom Control  Outcome: Ongoing - Unchanged

## 2021-09-17 NOTE — Unmapped (Signed)
Tacrolimus Therapeutic Monitoring Pharmacy Note    Miguel Ward is a 70 y.o. male continuing tacrolimus.     Indication: Kidney transplant     Date of Transplant: 06/11/2007      Prior Dosing Information: Home regimen 3 mg BID . Current regimen 1mg  bid    Goals:  Therapeutic Drug Levels  Tacrolimus trough goal: 2-4 mg/mL -- reduced in the setting of respiratory failure 2/2 legionella pneumonia    Additional Clinical Monitoring/Outcomes  ?? Monitor renal function (SCr and urine output) and liver function (LFTs)  ?? Monitor for signs/symptoms of adverse events (e.g., hyperglycemia, hyperkalemia, hypomagnesemia, hypertension, headache, tremor)    Results:   Tacrolimus level:   10/1 tacro = 4.3ng/mL at 0609   10/2 tacro = 4.0ng/mL at 0545   10/3 tacro = 2.9ng/ml at 0743   10/4 tacro = 2.4ng/ml [id number]    Pharmacokinetic Considerations and Significant Drug Interactions:  ??? Concurrent hepatotoxic medications: APAP (q8h schedule) - Atorva:   BOTH DC'd 10/2)  ??? Concurrent CYP3A4 substrates/inhibitors: at time of note  ??? Concurrent nephrotoxic medications: Lasix, acyclovir        Assessment/Plan:   Tacrolimus level in goal range    Recommendedation(s)  ??? continue tacrolimus S 1 mg BID   Tacrolimus level stable and can change monitoring to 2-3 times per week at this time    Follow-up  ??? Next tacrolimus trough on 09/19/21 at 0700  ???  pharmacist will continue to monitor and recommend levels as appropriate    Please page service pharmacist with questions/clarifications.    Beverely Risen, PharmD

## 2021-09-17 NOTE — Unmapped (Addendum)
IMMUNOCOMPROMISED HOST INFECTIOUS DISEASE PROGRESS NOTE    Assessment/Plan:     Miguel Ward is a 70 y.o. 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 COVID tests negative x 3 here. Imaging and severity suggested alternative infection vs. superinfection, and thus patient started on broad-spectrum coverage with vancomycin/cefepime -> cefepime with some improvement.    Found to have legionellosis (Ag and PCR) and started on levofloxacin as well. Respiratory status improved, however progress in hemodynamic and mental status somewhat stagnated bringing up concern for other untreated etiology. CT chest with significant consolidations throughout R lung - appreciate team assistance with bronchoscopy, BAL cx unremarkable. LFTs, particularly alk-phos, have also been increasing despite negative imaging. Patient has since been extubated and no longer requiring CRRT with improved mental status.  We recommend continuing levofloxacin for now, extending therapy to 14 days.        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 Legionella Ag positive, pt also at risk for other bacterial infection or other pathogens (fungi, PJP, CMV)  - 09/09/2021 found at home minimally responsive, brought to ED, intubated  - 09/05/2021 NP and tracheal specimens negative for SARS-CoV2  - 09/08/21 urine legionella antigen positive. PCR of tracheal aspirate positive  - 09/11/21 CT chest extensive mass-like consolidations with air bronchograms throughout R lung, some in LUL as well  - 09/11/21 Increasing WBC, LFTs (particularly ALP and bilirubin); Bcx NGTD  - 09/11/21 Bronch/BAL: Negative mycoplasma, chlamydiophila, PJP, EBV, CMV, RPP. Cx with 30K probable CoNS   Antibiotics  - Current   Levofloxacin 9/26 -   - Prior   Vanc/CFP/levofloxacin 9/29   cefepime/levofloxacin 9/27   added levofloxacin 9/26   Vancomycin/cefepime 9/25    #Increasing ALP and ALT/AST  - 08/30/2021 admission with mildly elevated Tbili/ALP and AST (though with elevated CK)  - 09/10/21 RUQ [place] no abnormalities, no doppler done  - 09/13/21 ALP increasing steadily up to 628. Tbili stabilized, AST/ALT increasing as well  - 09/14/21 HBsAb reactive (21.74); HBcAB non-reactive; HBc IgM non-reactive  - 09/16/21 ALP increased to 957, T Bili 1, AST decreased to 259, ALT increased to 209  - Along with WBC increase, somewhat concerning for drug reaction.  Minimal suspicion CFP or vancomycin, not impossible but rare with levofloxacin    #COVID antigen positive at home 09/05/21  Three negative PCRs while inpatient. Unclear if implies false positive, perhaps resolving infection from weeks prior  - No remdesivir received     Prior infections:  #Fever and leukocytosis, possible UTI 08/2016  #Liver lesions suspected to be abscesses  - Suspected UTI, improved on empiric broad spectrum therapy Vanc/CFP -> doxy  - CT new liver lesions, FDG avid on PET, concerning for metastases  - Liver biopsy 09/2016 nonspecific inflammatory changes suggesting resolving abscess  - MRI 01/2017 improved liver lesions     #RSV infection 02/2018  - Also diagnosed with decompensated diastolic and systolic HF     Antimicrobial Intolerance/allergy  Penicillins - hives (2014), tolerated cephalosporins       RECOMMENDATIONS    Diagnostic  ?? Follow up BAL cx: fungal, AFB, Asp Ag, actinomyces - NGTD  ?? CTM LFTs - improving today    Lab monitoring for antimicrobial toxicities  ?? CBC w/ diff and CMP at least weekly    Treatment  Multifocal pneumonia, Legionella antigen positive  ?? Continue Levofloxacin  ?? Duration 14 days (through 09/21/21)     Immunosuppression and prophylaxis  ?? IS deferred to transplant team  ?? Cont ppx with acyclovir           The [facility] ID APP service will sign off.  Please page Darolyn Rua, NP at [phone] from 8-4:30pm, after 4:30 pm page the ID Transplant/Liquid Oncology Fellow consult at [phone] with questions.    Patient discussed with Dr. Raina Mina, MSN, APRN, AGNP-C  Immunocompromised Infectious Disease Nurse Practitioner    Subjective:     Interval History:    History obtained from:patient and nurse.  Events since last encounter: None  Today's HPI for primary problems: Alert and responsive this AM on room air. Afebrile overnight. Denies pain. No complaints.       Medications:  Antimicrobials:  Anti-infectives (From admission, onward)      None            Prior/Current immunomodulators:  Tacrolimus 1/1  MMF None currently  Prednisone 15    Other medications reviewed.    Objective:     Vital Signs last 24 hours:  Temp:  [36.2 ??C (97.2 ??F)-37.4 ??C (99.3 ??F)] 37.1 ??C (98.8 ??F)  Heart Rate:  [73-108] 101  SpO2 Pulse:  [61-122] 103  Resp: [16-36] 22  BP: (102-166)/(44-88) 150/55  MAP (mmHg):  [63-106] 87  SpO2:  [90 %-99 %] 95 %    Physical Exam:   Patient Lines/Drains/Airways Status       Active Active Lines, Drains, & Airways       Name Placement date Placement time Site Days    Hemodialysis Catheter With Distal Infusion Port 09/08/21 Right Internal jugular 09/08/21  1600  Internal jugular  8    NG/OG Tube Feedings Left nostril 09/14/21  1500  Left nostril  2    Urethral Catheter Temperature probe 16 Fr. 2021-09-25  1602  Temperature probe  9    Arteriovenous Fistula - Vein Graft  Access 11/01/18 Right;Upper Arm 11/01/18  --  Arm  1051                  Const [x]  vital signs above    []  NAD, non-toxic appearance  Lying comfortably      Eyes [x]  Lids normal bilaterally, conjunctiva anicteric and  noninjected OU     [] PERRL  [] EOMI     ENMT [x]  Normal appearance of external nose and ears, no nasal discharge        [x]  MMM, no lesions on lips or gums [x]  No thrush, leukoplakia, oral lesions  []  Dentition good []  Edentulous []  Dental caries present  []  Hearing normal  []  TMs with good light reflexes bilaterally   NG tube in place      Neck [x]  Neck of normal appearance and trachea midline        []  No thyromegaly, nodules, or tenderness   []  Full neck ROM  RIJ trialysis catheter in place, no bleeding, erythema.      Lymph [x]  No LAD in neck     []  No LAD in supraclavicular area     []  No LAD in axillae   []  No LAD in epitrochlear chains     []  No LAD in inguinal areas        CV [x]  RRR            [x]  No peripheral edema     []  Pedal pulses intact   []  No abnormal heart sounds appreciated   []  Extremities WWP         Resp [x]  Normal WOB at rest    []  No breathlessness with speaking, no coughing  []  CTA anteriorly    []  CTA bilaterally    Slight crackles; productive cough      GI [x]  Normal inspection, NTND   []  NABS     []  No umbilical hernia on exam       []  No hepatosplenomegaly     []  Inspection of perineal and perianal areas normal  Transplanted kidney palpable. No abdominal pain.      GU []  Normal external genitalia     [] No urinary catheter present in urethra   []  No CVA tenderness    []  No tenderness over renal allograft  Foley present      MSK [x]  No clubbing or cyanosis of hands       []  No vertebral point tenderness  []  No focal tenderness or abnormalities on palpation of joints in RUE, LUE, RLE, or LLE        Skin [x]  No rashes, lesions, or ulcers of visualized skin     []  Skin warm and dry to palpation   RUE with AVG with thrill, dilated up to shoulder. LUE with graft without thrill           Neuro _x Face expression symmetric  []  Sensation to light touch grossly intact throughout    []  Moves extremities equally    []  No tremor noted        []  CNs II-XII grossly intact     []  DTRs normal and symmetric throughout []  Gait unremarkable  Raspy voice but responds in 1-2 words appropriately, following commands; moving all extremities      Psych [x]  Appropriate affect       []  Fluent speech         [x]  Attentive, good eye contact  []  Oriented to person, place, time          []  Judgment and insight are appropriate   Answering appropriately          Data for Medical Decision Making     Recent Labs   Lab Units 09/17/21  0916 09/17/21  0310 09/16/21  0756 09/16/21  0308   WBC 10*9/L  --  17.7*   < > 23.4*   HEMOGLOBIN g/dL  --  7.0*   < > 7.1*   PLATELET COUNT (1) 10*9/L  --  79*   < > 77*   NEUTRO ABS 10*9/L  --  15.5*  --  21.2*   LYMPHO ABS 10*9/L  --  1.1  --  0.8*   EOSINO ABS 10*9/L  --  0.0  --  0.1   BUN mg/dL  --  64*   < > 41*   CREATININE mg/dL  --  2.95*   < > 6.21*   AST U/L  --  218*  --  259*   ALT U/L  --  199*  --  209*   BILIRUBIN TOTAL mg/dL  --  0.8  --  1.0   ALK PHOS U/L  --  785*  --  957*   POTASSIUM WHOLE BLOOD mmol/L 5.4*  --    < >  --    POTASSIUM mmol/L  --  5.5*   < > 4.8   MAGNESIUM mg/dL  --  2.0   < > 1.9   PHOSPHORUS mg/dL  --  1.8*   < > 2.1*   CALCIUM mg/dL  --  8.6*   < > 8.7   CK TOTAL U/L  --   --   --  94.0    < > = values in this interval not displayed.     I reviewed and noted the following labs: WBC 17.7, Hgb 7, Plt 79 (stable), Cr 2.15 (off CRRT), ALP 785 (stable)    Microbiology:    9/25 Bcx: no growth  9/25 Ucx no growth, UA 3 WBC  9/25 Lower resp cx: OPF  9/25 SARS-CoV-2 negative for NP and trach aspirate  9/25 CrAg negative  9/26 RPP negative  9/26 Legionella Ag (urine) positive  9/26 Legionella PCR positive  9/26 CMV PCR negative  9/26 Fungitell negative  9/29 BAL cx OPF, 30K probably CoNS  9/29 BAL AFB smear negative, Bcx no growth (Final); Fungal cx NGTD  9/29 PJP smear negative    Past cultures were reviewed in Epic and CareEverywhere    Imaging:    09/16/21 - CXR  IMPRESSION:     Interval removal of endotracheal tube and left IJ CVC with similar lung findings to prior.    09/16/21 - US Abdomen Limited    IMPRESSION:  -Cholelithiasis without sonographic evidence of acute cholecystitis.  -Right pleural effusion and ascites seen on prior US      Additional Studies:   (09/15/21) EKG [id number]

## 2021-09-17 NOTE — Unmapped (Signed)
MICU Evening Summary     Date of Service: 09/16/2021    Interval History: Miguel Ward is a 70 y.o. male with PMHx HTN, 2009 renal transplant (no longer on ??iHD), CAD, HLD, HFrEF, NSTEMI, and lupus nephritis presented 9/25 for AMS. When EMS arrived, he was fully unresponsive, and hypoglycemic. ??He was given 250 mL of D10, and became semi conscious. He still only responded to pain and verbal, and was started on levo for SBP 90s.On arrival to ED he was intubated for airway protection and hypoxia. CXR c/f PNA . Critical care services are indicated for:    Principal Problem:    Acute respiratory distress syndrome (ARDS) due to COVID-19 virus (CMS-HCC)  Active Problems:    Hypercholesterolemia    Hypertension    History of kidney transplant    Renal cell carcinoma (CMS-HCC)    Immunosuppression-related infectious disease (CMS-HCC)    Bilateral lower extremity edema    Elevated troponin I level    GERD (gastroesophageal reflux disease)    Diastolic heart failure (CMS-HCC)    Acute on chronic HFrEF (heart failure with reduced ejection fraction) (CMS-HCC)    COVID    ATN (acute tubular necrosis) (CMS-HCC)    Acute renal failure with tubular necrosis (CMS-HCC)  Resolved Problems:    * No resolved hospital problems. *        Assessment & Plan       N: CT head 9/29 neg, pt/ot, remains drowsy     N: CT head 9/29 neg, pt/ot, remains drowsy  P: extubated to [place] 10/2, aggressive pulm toilet for airway clearance. Weak cough  CV: levo (tol levo at 8 for inc UF), SDS off 10/1, echo 9/26 EF 30-35% dec RV fxn, mod TR phtn, dd3, afib rate controlled. Off pressors 10/2, more edema cxr, anticipate iHD 8/5  GI: TF increased bili. ??RUQ [place]:? hepatitis/cirrhosis, liver US doppler: 10/2????moderate volu ascites, Mildly sluggish flow in the right portal vein. LFTs still up, repeat RUQ [place] today   R: baseline Cr 3, CRRT UF 150, plan to stop CRRT, will transition to HD in the next day or two, trend K between RT  H: fem line- bleed after removal>>groin site benign. , heparin ppx. Bleeding from TLC requiring 1u prbc. Pulled, still requiring intermittent pressure ??   E: NAI  ID: repeat covid (-x3), , levaquin for Legionnaires's + thru to 10/5 per ICID. Stopped vanc/cefepime per ICID 9/25-10/3., f/u hep B studies   Immuno: tac 1q12h. ??Hold cellcept      Critical Care Attestation     This patient is critically ill or injured with the impairment of vital organ systems such that there is a high probability of imminent or life threatening deterioration in the patient's condition. This patient must remain in the ICU for ongoing evaluation of the comprehensive management plan outlined in this note. I directly provided critical care services as documented in this note and the critical care time spent (45  min) is exclusive of separately billable procedures.  In addition to time spent for critical care management, I also provided advance care planning services for 0  minutes (see ACP note for details)???. Total billable critical care time 45  minutes.    Terrell Ostrand Willow Ora, ACNP

## 2021-09-17 NOTE — Unmapped (Signed)
See initial consult note.

## 2021-09-17 NOTE — Unmapped (Signed)
MICU Daily Progress Note     Date of Service: 09/17/2021    Problem List:   Principal Problem:    Acute respiratory distress syndrome (ARDS) due to COVID-19 virus (CMS-HCC)  Active Problems:    Hypercholesterolemia    Hypertension    History of kidney transplant    Renal cell carcinoma (CMS-HCC)    Immunosuppression-related infectious disease (CMS-HCC)    Bilateral lower extremity edema    Elevated troponin I level    GERD (gastroesophageal reflux disease)    Diastolic heart failure (CMS-HCC)    Acute on chronic HFrEF (heart failure with reduced ejection fraction) (CMS-HCC)    COVID    ATN (acute tubular necrosis) (CMS-HCC)    Acute renal failure with tubular necrosis (CMS-HCC)  Resolved Problems:    * No resolved hospital problems. *      HPI: Miguel Ward is a 70 y.o. male renal transplant from lupus nephritis (complex course over last 15 years since transplant followed here including renal cell) here with acute hypoxic respiratory failure, COVID 19 and shock with AKI.    24hr events:  -tlc removed  - CRRT off and not restarted    Neurological   #acute metabolic encephalopathy  - analegsia prn  - CT head 9/29 no acute findings  - neuro exam improving >> sustained wakefulness and interaction, non focal. Remains intermittently drowsy and weak     Pulmonary   #ARDS, Acute resp failure, resolving   - intubated 9/25 on minimal settings, passing SBTs yet precluded extubation for mentation  - bronched 9/29 secretions blood tinged in trach and mainstem airways, none seen in distal airways  - passed SBT w improved wakefulness and ability to clear cough, extubated to [place] on 10/2  - tolerating [place] well, but with rhonchi and weak cough.  Will start chest PT/nebs        Cardiovascular   #septic shock, elevated trop  #HFrEF, ho htn, hld, cad  - formal echo 9/26 EF @ 30%, 2020 ECHO 40-45%  - cont home regimen asa (holding statin due to elevated lfts)   - hold home regimen metop, vasotec, lasix  - SDS 9/26--> 10/1  - levo for MAP>65, off since 10/2    Renal   #AKI (baseline Cr 2.7-3), AGMA likely ATN 2/2 sepsis and hypotension  #hx renal transplant s/t lupus nephritis, c/b rejection and renal cell CA s/p cryo ablation  - foley for accurate I/O during critical illness   - worsening renal fxn and CRRT started 9/26--> 10/4.   - continue tac with goal of 2-4   -  prednisone 15 mg , but will lower to 10 mg 10/6 x 5 days and then to home dose of 5mg    - continue to hold cellcept  - cont magox and calcitriol  - replete electrolytes prn  - plan iHD 10/6     Infectious Disease/Autoimmune   #COVID 19 infection  Symptom onset:??9/22  Exposure: unknown  COVID PCR+: neg x 3  (home test positive 9/23?)  Day of admission/transfer: 9/25  COVID Specific??Meds:??none given   Vaccination status: full & boosted x once, evushield given 8/17  3 PCRs since admission negative and taken off precautions   ??  9/25 Bcx neg   9/25 Ucx pending, UA 3 WBC  9/25 Lower resp cx, pending, OPF  9/25 SARS-CoV-2 negative for NP and trach aspirate  9/25 CrAg negative  9/26 RPP negative  9/26 Legionella Ag (urine) positive  9/26 Legionella PCR positive  9/26 CMV PCR negative  ??  Multifocal PNA:   - on vanco/cefepime on 9/25, stopped 10/3 per ICID  - MRSA screen neg and vanco stopped 9/27, restarted 9/29 in the setting of worsening pressor requirements and GPC seen on BAL from Bronch on 9/29  - CT Chest 9/29 c/w volume overload, pna and rt pleural effusion      Legionella, PNA   - levofloxacin started 9/26, 10-14 day course (til 10/5 at least) per ID  ??  Cultures:  Blood Culture, Routine (no units)   Date Value   09/11/2021 No Growth at 5 days     Urine Culture, Comprehensive (no units)   Date Value   09/09/2021 NO GROWTH     Lower Respiratory Culture (no units)   Date Value   09/11/2021 Specimen Not Processed   09/12/2021 OROPHARYNGEAL FLORA ISOLATED     WBC (10*9/L)   Date Value   09/17/2021 17.7 (H)     WBC, UA (/HPF)   Date Value   08/20/2021 <1        FEN/GI   #gerd  - TF tol well at goal + dysphagia 6 diet   - ppi for gi ppx  - bowel regimen held for loose stools    Transaminitis and Elevated bilirubin: overall all uptrending, [place] RUQ didn't demonstrated acute cholecystitis.  - continue to trend LFTs  - potential need for HIDA if continues to increase holding off given bilirubin downtrending   -  [place] negative.  Also stopped statin and abx in the past 24hrs so will monitor --> slightly improved     Malnutrition Assessment: Not done yet.  Body mass index is 22.68 kg/m??.        Heme/Coag   Rt groin bleeding s/p line removal, resolved  - was on heparin gtt for NSTEMI and afib- stopped 9/27  - continue SQH for DVT ppx  - HH, plts, coags acceptable  - no evid active bleeding  - SCDs for DVT ppx  - last transfused 9/27    Endocrine   #hypoglycemic, hypothryoid  - glucose within target range yes  - cont ADI + accu checks , NPH 25 mg bid   - hold home regimen allopurinol  - cont home regimen synthroid    Integumentary   #NAI  - WOCN consulted for high risk skin assessment No. Reason: no alterations in skin.  - WOCN recs >> agree with assmt and plan 9/30  - cont pressure mitigating precautions per skin policy    Prophylaxis/LDA/Restraints/Consults   Can CVC be removed? No: dialysis catheter   Can A-line be removed? No: >moderate dose pressors  Can Foley be removed? No: Need continuous I/O  Mobility plan: Step 2 - Head of bed elevation (>60 degrees)    Feeding: Tube feeds at goal  Analgesia: Pain adequately controlled  Sedation SAT/SBT: Yes  Thromboembolic ppx: SQ heparin  Head of bed >30 degrees: Yes  Ulcer ppx: Yes, home use continued  Glucose within target range: Yes, in range    Does patient need/have an active type/screen? Yes    RASS at goal? No - adjusting sedation or order to reflect need  [place] Agitation Assessment Scale (RASS) : -1 (09/17/2021  8:00 AM)     Can antipsychotics be stopped? N/A, not on antipsychotics  CAM-ICU Result: Positive (09/10/2021  8:00 PM)      Would hospice care be appropriate for this patient? No, patient improving or expected to improve  Any unaddressed hospice/palliative care  needs? no    Patient Lines/Drains/Airways Status     Active Active Lines, Drains, & Airways     Name Placement date Placement time Site Days    Hemodialysis Catheter With Distal Infusion Port 09/08/21 Right Internal jugular 09/08/21  1600  Internal jugular  8    NG/OG Tube Feedings Left nostril 09/14/21  1500  Left nostril  2    Urethral Catheter Temperature probe 16 Fr. September 24, 2021  1602  Temperature probe  9    Arteriovenous Fistula - Vein Graft  Access 11/01/18 Right;Upper Arm 11/01/18  --  Arm  1051              Patient Lines/Drains/Airways Status     Active Wounds     Name Placement date Placement time Site Days    Wound 09/09/21 Pressure Injury Sacrum Right;Mid dark red nonblanching with dark skin surrounding DTI 09/09/21  2130  Sacrum  7                Goals of Care     Code Status: Full Code    Designated Healthcare Decision Maker:  Mr. Hobbins current decisional capacity for healthcare decision-making is Full capacity. His designated healthcare decision maker(s) is/are   HCDM (patient stated preference): Doster,Katherine - Spouse - [phone].    Subjective   Sleepy, but awakes to voice and responds appropriately     Objective     Vitals - past 24 hours  Temp:  [36.2 ??C (97.2 ??F)-37.4 ??C (99.3 ??F)] 37.1 ??C (98.8 ??F)  Heart Rate:  [73-108] 101  SpO2 Pulse:  [61-122] 103  Resp:  [16-36] 22  BP: (102-166)/(44-88) 150/55  SpO2:  [90 %-99 %] 95 % Intake/Output  I/O last 3 completed shifts:  In: 2315.8 [Blood:420.8; NG/GT:1895]  Out: 2300 [Urine:134; Other:2166]     Physical Exam:    General: thin elderly male, in NAD   HEENT: PERRL, slightly icteric sclera w edema  CV: RRR, no m/r/g, 2+ BLE edema   Pulm: diminished throughout, scattered rhonchi, dec bases  GI: abd round, non tender  MSK: grossly intact, tone intact  Skin: grossly intact, warm  Neuro: FSC, CN grossly intact, non focal motor, BUE 2/5, BLE 2/5       Continuous Infusions:       Scheduled Medications:   ??? aspirin  81 mg Enteral tube: gastric  Daily   ??? calcitRIOL  0.25 mcg Enteral tube: gastric  Q MWF   ??? cellulose, oxidized reg 1X 2 pad   Topical Once   ??? esomeprazole  40 mg Enteral tube: gastric  daily   ??? heparin (porcine)  5,000 Units Subcutaneous [facility]   ??? flu vacc qs2022-23 6mos up(PF)  0.5 mL Intramuscular During hospitalization   ??? insulin NPH  25 Units Subcutaneous Q12H [facility]   ??? insulin regular  0-20 Units Subcutaneous Q6H [facility]   ??? levothyroxine  250 mcg Enteral tube: gastric  daily   ??? magnesium oxide  400 mg Enteral tube: gastric  BID   ??? predniSONE  15 mg Oral Daily   ??? sodium phosphate  18 mmol Intravenous Once   ??? Tacrolimus  1 mg Enteral tube: gastric  BID   ??? white petrolatum-mineral oil  1 application Both Eyes BID       PRN medications:  dextrose in water, dextrose in water, glucagon, glucose, heparin (porcine), heparin (porcine), ondansetron, sodium chloride    Data/Imaging Review: Reviewed in Epic and personally interpreted on 09/17/2021. See  EMR for detailed results.         Critical Care Attestation     This patient is critically ill or injured with the impairment of vital organ systems such that there is a high probability of imminent or life threatening deterioration in the patient's condition. This patient must remain in the ICU for ongoing evaluation of the comprehensive management plan outlined in this note. I directly provided critical care services as documented in this note and the critical care time spent (50  min) is exclusive of separately billable procedures.  In addition to time spent for critical care management, I also provided advance care planning services for 0  minutes (see ACP note for details)???. Total billable critical care time 50  minutes.    Sederick Jacobsen Paulino Door, PA

## 2021-09-18 LAB — ENDOTOOL
ENDOTOOL GLUCOSE: 119 mg/dL — ABNORMAL LOW (ref 140–180)
ENDOTOOL GLUCOSE: 131 mg/dL — ABNORMAL LOW (ref 140–180)
ENDOTOOL GLUCOSE: 133 mg/dL — ABNORMAL LOW (ref 140–180)
ENDOTOOL GLUCOSE: 135 mg/dL — ABNORMAL LOW (ref 140–180)
ENDOTOOL GLUCOSE: 136 mg/dL — ABNORMAL LOW (ref 140–180)
ENDOTOOL GLUCOSE: 138 mg/dL — ABNORMAL LOW (ref 140–180)
ENDOTOOL GLUCOSE: 141 mg/dL (ref 140–180)
ENDOTOOL GLUCOSE: 143 mg/dL (ref 140–180)
ENDOTOOL GLUCOSE: 146 mg/dL (ref 140–180)
ENDOTOOL GLUCOSE: 152 mg/dL (ref 140–180)
ENDOTOOL GLUCOSE: 155 mg/dL (ref 140–180)
ENDOTOOL GLUCOSE: 165 mg/dL (ref 140–180)
ENDOTOOL GLUCOSE: 178 mg/dL (ref 140–180)
ENDOTOOL GLUCOSE: 178 mg/dL (ref 140–180)
ENDOTOOL GLUCOSE: 75 mg/dL — ABNORMAL LOW (ref 140–180)
ENDOTOOL GLUCOSE: 88 mg/dL — ABNORMAL LOW (ref 140–180)
ENDOTOOL INSULIN RATE: 0 U/h
ENDOTOOL INSULIN RATE: 0 U/h
ENDOTOOL INSULIN RATE: 0 U/h
ENDOTOOL INSULIN RATE: 1 U/h
ENDOTOOL INSULIN RATE: 1.1 U/h
ENDOTOOL INSULIN RATE: 1.5 U/h
ENDOTOOL INSULIN RATE: 1.6 U/h
ENDOTOOL INSULIN RATE: 1.7 U/h
ENDOTOOL INSULIN RATE: 1.7 U/h
ENDOTOOL INSULIN RATE: 1.8 U/h
ENDOTOOL INSULIN RATE: 2.2 U/h
ENDOTOOL INSULIN RATE: 2.4 U/h
ENDOTOOL INSULIN RATE: 2.4 U/h
ENDOTOOL INSULIN RATE: 3 U/h
ENDOTOOL INSULIN RATE: 3.4 U/h
ENDOTOOL INSULIN RATE: 3.6 U/h

## 2021-09-18 LAB — CBC
HEMATOCRIT: 22.7 % — ABNORMAL LOW (ref 39.0–48.0)
HEMATOCRIT: 21.2 % — ABNORMAL LOW (ref 39.0–48.0)
HEMOGLOBIN: 7.4 g/dL — ABNORMAL LOW (ref 12.9–16.5)
HEMOGLOBIN: 6.8 g/dL — ABNORMAL LOW (ref 12.9–16.5)
MEAN CORPUSCULAR HEMOGLOBIN CONC: 32.6 g/dL (ref 32.0–36.0)
MEAN CORPUSCULAR HEMOGLOBIN CONC: 32.3 g/dL (ref 32.0–36.0)
MEAN CORPUSCULAR HEMOGLOBIN: 27.7 pg (ref 25.9–32.4)
MEAN CORPUSCULAR HEMOGLOBIN: 27.8 pg (ref 25.9–32.4)
MEAN CORPUSCULAR VOLUME: 84.9 fL (ref 77.6–95.7)
MEAN CORPUSCULAR VOLUME: 86.2 fL (ref 77.6–95.7)
MEAN PLATELET VOLUME: 10.8 fL — ABNORMAL HIGH (ref 6.8–10.7)
MEAN PLATELET VOLUME: 10.9 fL — ABNORMAL HIGH (ref 6.8–10.7)
PLATELET COUNT: 95 10*9/L — ABNORMAL LOW (ref 150–450)
PLATELET COUNT: 96 10*9/L — ABNORMAL LOW (ref 150–450)
RED BLOOD CELL COUNT: 2.67 10*12/L — ABNORMAL LOW (ref 4.26–5.60)
RED BLOOD CELL COUNT: 2.46 10*12/L — ABNORMAL LOW (ref 4.26–5.60)
RED CELL DISTRIBUTION WIDTH: 15.8 % — ABNORMAL HIGH (ref 12.2–15.2)
RED CELL DISTRIBUTION WIDTH: 16 % — ABNORMAL HIGH (ref 12.2–15.2)
WBC ADJUSTED: 16.8 10*9/L — ABNORMAL HIGH (ref 3.6–11.2)
WBC ADJUSTED: 16.6 10*9/L — ABNORMAL HIGH (ref 3.6–11.2)

## 2021-09-18 LAB — BLOOD GAS CRITICAL CARE PANEL, VENOUS
BASE EXCESS VENOUS: 5.1 — ABNORMAL HIGH (ref -2.0–2.0)
BASE EXCESS VENOUS: 6.4 — ABNORMAL HIGH (ref -2.0–2.0)
BASE EXCESS VENOUS: 7 — ABNORMAL HIGH (ref -2.0–2.0)
CALCIUM IONIZED VENOUS (MG/DL): 4.99 mg/dL (ref 4.40–5.40)
CALCIUM IONIZED VENOUS (MG/DL): 5.07 mg/dL (ref 4.40–5.40)
CALCIUM IONIZED VENOUS (MG/DL): 5.19 mg/dL (ref 4.40–5.40)
GLUCOSE WHOLE BLOOD: 131 mg/dL (ref 70–179)
GLUCOSE WHOLE BLOOD: 147 mg/dL (ref 70–179)
GLUCOSE WHOLE BLOOD: 189 mg/dL — ABNORMAL HIGH (ref 70–179)
HCO3 VENOUS: 29 mmol/L — ABNORMAL HIGH (ref 22–27)
HCO3 VENOUS: 31 mmol/L — ABNORMAL HIGH (ref 22–27)
HCO3 VENOUS: 31 mmol/L — ABNORMAL HIGH (ref 22–27)
HEMOGLOBIN BLOOD GAS: 7 g/dL — ABNORMAL LOW
HEMOGLOBIN BLOOD GAS: 7.4 g/dL — ABNORMAL LOW
HEMOGLOBIN BLOOD GAS: 8.9 g/dL — ABNORMAL LOW
LACTATE BLOOD VENOUS: 1.2 mmol/L (ref 0.5–1.8)
LACTATE BLOOD VENOUS: 1.3 mmol/L (ref 0.5–1.8)
LACTATE BLOOD VENOUS: 1.5 mmol/L (ref 0.5–1.8)
O2 SATURATION VENOUS: 57.8 % (ref 40.0–85.0)
O2 SATURATION VENOUS: 74.8 % (ref 40.0–85.0)
O2 SATURATION VENOUS: 82 % (ref 40.0–85.0)
PCO2 VENOUS: 45 mmHg (ref 40–60)
PCO2 VENOUS: 46 mmHg (ref 40–60)
PCO2 VENOUS: 47 mmHg (ref 40–60)
PH VENOUS: 7.43 (ref 7.32–7.43)
PH VENOUS: 7.43 (ref 7.32–7.43)
PH VENOUS: 7.45 — ABNORMAL HIGH (ref 7.32–7.43)
PO2 VENOUS: 30 mmHg (ref 30–55)
PO2 VENOUS: 40 mmHg (ref 30–55)
PO2 VENOUS: 42 mmHg (ref 30–55)
POTASSIUM WHOLE BLOOD: 4.4 mmol/L (ref 3.4–4.6)
POTASSIUM WHOLE BLOOD: 4.9 mmol/L — ABNORMAL HIGH (ref 3.4–4.6)
POTASSIUM WHOLE BLOOD: 5 mmol/L — ABNORMAL HIGH (ref 3.4–4.6)
SODIUM WHOLE BLOOD: 140 mmol/L (ref 135–145)
SODIUM WHOLE BLOOD: 140 mmol/L (ref 135–145)
SODIUM WHOLE BLOOD: 141 mmol/L (ref 135–145)

## 2021-09-18 LAB — HEPATIC FUNCTION PANEL
ALBUMIN: 1.7 g/dL — ABNORMAL LOW (ref 3.4–5.0)
ALKALINE PHOSPHATASE: 697 U/L — ABNORMAL HIGH (ref 46–116)
ALT (SGPT): 257 U/L — ABNORMAL HIGH (ref 10–49)
AST (SGOT): 295 U/L — ABNORMAL HIGH (ref <=34)
BILIRUBIN DIRECT: 0.5 mg/dL — ABNORMAL HIGH (ref 0.00–0.30)
BILIRUBIN TOTAL: 0.7 mg/dL (ref 0.3–1.2)
PROTEIN TOTAL: 4.9 g/dL — ABNORMAL LOW (ref 5.7–8.2)

## 2021-09-18 LAB — BASIC METABOLIC PANEL
ANION GAP: 6 mmol/L (ref 5–14)
ANION GAP: 5 mmol/L (ref 5–14)
BLOOD UREA NITROGEN: 93 mg/dL — ABNORMAL HIGH (ref 9–23)
BLOOD UREA NITROGEN: 53 mg/dL — ABNORMAL HIGH (ref 9–23)
BUN / CREAT RATIO: 32
BUN / CREAT RATIO: 30
CALCIUM: 8.8 mg/dL (ref 8.7–10.4)
CALCIUM: 8.5 mg/dL — ABNORMAL LOW (ref 8.7–10.4)
CHLORIDE: 106 mmol/L (ref 98–107)
CHLORIDE: 102 mmol/L (ref 98–107)
CO2: 28 mmol/L (ref 20.0–31.0)
CO2: 31 mmol/L (ref 20.0–31.0)
CREATININE: 2.88 mg/dL — ABNORMAL HIGH
CREATININE: 1.74 mg/dL — ABNORMAL HIGH
EGFR CKD-EPI (2021) MALE: 23 mL/min/{1.73_m2} — ABNORMAL LOW (ref >=60)
EGFR CKD-EPI (2021) MALE: 42 mL/min/{1.73_m2} — ABNORMAL LOW (ref >=60)
GLUCOSE RANDOM: 165 mg/dL (ref 70–179)
GLUCOSE RANDOM: 195 mg/dL — ABNORMAL HIGH (ref 70–179)
POTASSIUM: 5.8 mmol/L — ABNORMAL HIGH (ref 3.4–4.8)
POTASSIUM: 4.9 mmol/L — ABNORMAL HIGH (ref 3.4–4.8)
SODIUM: 140 mmol/L (ref 135–145)
SODIUM: 138 mmol/L (ref 135–145)

## 2021-09-18 LAB — ADDON DIFFERENTIAL ONLY
BASOPHILS ABSOLUTE COUNT: 0 10*9/L (ref 0.0–0.1)
BASOPHILS RELATIVE PERCENT: 0.1 %
EOSINOPHILS ABSOLUTE COUNT: 0 10*9/L (ref 0.0–0.5)
EOSINOPHILS RELATIVE PERCENT: 0.2 %
LYMPHOCYTES ABSOLUTE COUNT: 0.9 10*9/L — ABNORMAL LOW (ref 1.1–3.6)
LYMPHOCYTES RELATIVE PERCENT: 5.6 %
MONOCYTES ABSOLUTE COUNT: 1 10*9/L — ABNORMAL HIGH (ref 0.3–0.8)
MONOCYTES RELATIVE PERCENT: 6.1 %
NEUTROPHILS ABSOLUTE COUNT: 14.8 10*9/L — ABNORMAL HIGH (ref 1.8–7.8)
NEUTROPHILS RELATIVE PERCENT: 88 %

## 2021-09-18 LAB — IRON PANEL
IRON SATURATION: 41 % (ref 20–55)
IRON: 79 ug/dL
TOTAL IRON BINDING CAPACITY: 195 ug/dL — ABNORMAL LOW (ref 250–425)

## 2021-09-18 LAB — PROTIME-INR
INR: 1.22
PROTIME: 13.9 s — ABNORMAL HIGH (ref 9.8–12.8)

## 2021-09-18 LAB — PHOSPHORUS
PHOSPHORUS: 3 mg/dL (ref 2.4–5.1)
PHOSPHORUS: 2.6 mg/dL (ref 2.4–5.1)

## 2021-09-18 LAB — FIBRINOGEN: FIBRINOGEN LEVEL: 353 mg/dL (ref 175–500)

## 2021-09-18 LAB — APTT
APTT: 36.9 s — ABNORMAL HIGH (ref 25.1–36.5)
HEPARIN CORRELATION: 0.2

## 2021-09-18 LAB — FERRITIN: FERRITIN: 2080.9 ng/mL — ABNORMAL HIGH

## 2021-09-18 LAB — MAGNESIUM
MAGNESIUM: 2.3 mg/dL (ref 1.6–2.6)
MAGNESIUM: 2 mg/dL (ref 1.6–2.6)

## 2021-09-18 LAB — PLATELET COUNT: PLATELET COUNT: 113 10*9/L — ABNORMAL LOW (ref 150–450)

## 2021-09-18 LAB — D-DIMER, QUANTITATIVE: D-DIMER QUANTITATIVE (CW,ML,HL,HS,CH,JS,JC): 3626 ng{FEU}/mL — ABNORMAL HIGH (ref <=500)

## 2021-09-18 MED ADMIN — esomeprazole (NexIUM) granules 40 mg: 40 mg | GASTROENTERAL | @ 11:00:00

## 2021-09-18 MED ADMIN — levothyroxine (SYNTHROID) tablet 250 mcg: 250 ug | GASTROENTERAL | @ 11:00:00

## 2021-09-18 MED ADMIN — aspirin chewable tablet 81 mg: 81.0 mg | GASTROENTERAL | @ 13:00:00

## 2021-09-18 MED ADMIN — magnesium oxide (MAG-OX) tablet 400 mg: 400 mg | GASTROENTERAL | @ 13:00:00

## 2021-09-18 MED ADMIN — tacrolimus (PROGRAF) oral suspension: 1 mg | GASTROENTERAL | @ 13:00:00

## 2021-09-18 MED ADMIN — tacrolimus (PROGRAF) oral suspension: 1 mg | GASTROENTERAL | @ 01:00:00

## 2021-09-18 MED ADMIN — white petrolatum-mineral oil (SOOTHE PM) 80-20 % ophthalmic ointment 1 application: 1 | OPHTHALMIC | @ 01:00:00

## 2021-09-18 MED ADMIN — heparin (porcine) 5,000 unit/mL injection 5,000 Units: 5000 [IU] | SUBCUTANEOUS | @ 11:00:00

## 2021-09-18 MED ADMIN — predniSONE (DELTASONE) tablet 10 mg: 10 mg | ORAL | @ 13:00:00 | Stop: 2021-09-23

## 2021-09-18 MED ADMIN — heparin (porcine) 5,000 unit/mL injection 5,000 Units: 5000 [IU] | SUBCUTANEOUS | @ 01:00:00

## 2021-09-18 MED ADMIN — white petrolatum-mineral oil (SOOTHE PM) 80-20 % ophthalmic ointment 1 application: 1 | OPHTHALMIC | @ 13:00:00

## 2021-09-18 MED ADMIN — sodium zirconium cyclosilicate (LOKELMA) packet 10 g: 10 g | GASTROENTERAL | @ 13:00:00

## 2021-09-18 MED ADMIN — heparin (porcine) 5,000 unit/mL injection 5,000 Units: 5000 [IU] | SUBCUTANEOUS | @ 20:00:00

## 2021-09-18 MED ADMIN — magnesium oxide (MAG-OX) tablet 400 mg: 400 mg | GASTROENTERAL | @ 01:00:00

## 2021-09-18 MED ADMIN — albumin human 25 % bottle 25 g: 25 g | INTRAVENOUS | @ 15:00:00

## 2021-09-18 NOTE — Unmapped (Signed)
MICU Daily Progress Note     Date of Service: 09/18/2021    Problem List:   Principal Problem:    Acute respiratory distress syndrome (ARDS) due to COVID-19 virus (CMS-HCC)  Active Problems:    Hypercholesterolemia    Hypertension    History of kidney transplant    Renal cell carcinoma (CMS-HCC)    Immunosuppression-related infectious disease (CMS-HCC)    Bilateral lower extremity edema    Elevated troponin I level    GERD (gastroesophageal reflux disease)    Diastolic heart failure (CMS-HCC)    Acute on chronic HFrEF (heart failure with reduced ejection fraction) (CMS-HCC)    COVID    ATN (acute tubular necrosis) (CMS-HCC)    Acute renal failure with tubular necrosis (CMS-HCC)  Resolved Problems:    * No resolved hospital problems. *      HPI: Miguel Ward is a 70 y.o. male renal transplant from lupus nephritis (complex course over last 15 years since transplant followed here including renal cell) here with acute hypoxic respiratory failure, COVID 19 and shock with AKI.    24hr events:  -oozing from sites of old lines and insulin shots   - hyperkalemic without EKG changes  - hyperglycemic and endotool started     Neurological   #acute metabolic encephalopathy  - analegsia prn  - CT head 9/29 no acute findings  - neuro exam improving >> sustained wakefulness and interaction, non focal. Remains intermittently drowsy and weak     Pulmonary   #ARDS, Acute resp failure, resolving   - intubated 9/25 on minimal settings, passing SBTs yet precluded extubation for mentation  - bronched 9/29 secretions blood tinged in trach and mainstem airways, none seen in distal airways  - passed SBT w improved wakefulness and ability to clear cough, extubated to [place] on 10/2  - tolerating RA well, but with rhonchi and weak cough. cont chest PT/nebs / NT suctioning        Cardiovascular   #septic shock, elevated trop  #HFrEF, ho htn, hld, cad  - formal echo 9/26 EF @ 30%, 2020 ECHO 40-45%  - cont home regimen asa (holding statin due to elevated lfts)   - hold home regimen metop, vasotec, lasix  - SDS 9/26--> 10/1  - levo for MAP>65, off since 10/2    Renal   #AKI (baseline Cr 2.7-3), AGMA likely ATN 2/2 sepsis and hypotension  #hx renal transplant s/t lupus nephritis, c/b rejection and renal cell CA s/p cryo ablation  - worsening renal fxn and CRRT started 9/26--> 10/4.   - continue tac with goal of 2-4   -  prednisone 15 mg , but will lower to 10 mg 10/6 x 5 days and then to home dose of 5mg    - continue to hold cellcept  - cont magox and calcitriol  - replete electrolytes prn  -  iHD 10/6     Infectious Disease/Autoimmune   #COVID 19 infection  Symptom onset:??9/22  Exposure: unknown  COVID PCR+: neg x 3  (home test positive 9/23?)  Day of admission/transfer: 9/25  COVID Specific??Meds:??none given   Vaccination status: full & boosted x once, evushield given 8/17  3 PCRs since admission negative and taken off precautions   ??  9/25 Bcx neg   9/25 Ucx pending, UA 3 WBC  9/25 Lower resp cx, pending, OPF  9/25 SARS-CoV-2 negative for NP and trach aspirate  9/25 CrAg negative  9/26 RPP negative  9/26 Legionella Ag (urine) positive  9/26 Legionella PCR positive  9/26 CMV PCR negative  ??  Multifocal PNA:   - on vanco/cefepime on 9/25, stopped 10/3 per ICID  - MRSA screen neg and vanco stopped 9/27, restarted 9/29 in the setting of worsening pressor requirements and GPC seen on BAL from Bronch on 9/29  - CT Chest 9/29 c/w volume overload, pna and rt pleural effusion    Legionella, PNA   - levofloxacin started 9/26, 10-14 day course (til 10/5 at least) per ID  ??  Cultures:  Blood Culture, Routine (no units)   Date Value   09/11/2021 No Growth at 5 days     Urine Culture, Comprehensive (no units)   Date Value   15-Sep-2021 NO GROWTH     Lower Respiratory Culture (no units)   Date Value   09/11/2021 Specimen Not Processed   September 15, 2021 OROPHARYNGEAL FLORA ISOLATED     WBC (10*9/L)   Date Value   09/18/2021 16.8 (H)     WBC, UA (/HPF)   Date Value September 15, 2021 <1        FEN/GI   #gerd  - TF tol well at goal + dysphagia 6 diet   - ppi for gi ppx  - bowel regimen held for loose stools    Transaminitis and Elevated bilirubin: overall all uptrending, [place] RUQ didn't demonstrated acute cholecystitis.  - continue to trend LFTs  - potential need for HIDA if continues to increase holding off given bilirubin downtrending   -  [place] negative.  Also stopped statin and abx in the past 24hrs so will monitor -->  Fluctuating, CTM     Malnutrition Assessment: Not done yet.  Body mass index is 22.68 kg/m??.        Heme/Coag   Rt groin bleeding s/p line removal, resolved  - was on heparin gtt for NSTEMI and afib- stopped 9/27  - continue SQH for DVT ppx  - HH, plts, coags acceptable  - SCDs for DVT ppx  - last transfused 10/5.  Still oozing from old line sites and insulin injection sites.  DIC panel pending     Endocrine   #hypoglycemic, hypothryoid  - cont ADI + accu checks , NPH 25 mg bid ==> endotool 10/5   - hold home regimen allopurinol  - cont home regimen synthroid    Integumentary   #NAI  - WOCN consulted for high risk skin assessment No. Reason: no alterations in skin.  - WOCN recs >> agree with assmt and plan 9/30  - cont pressure mitigating precautions per skin policy    Prophylaxis/LDA/Restraints/Consults   Can CVC be removed? No: dialysis catheter   Can A-line be removed? No: >moderate dose pressors  Can Foley be removed? No: Need continuous I/O  Mobility plan: Step 2 - Head of bed elevation (>60 degrees)    Feeding: Tube feeds at goal  Analgesia: Pain adequately controlled  Sedation SAT/SBT: Yes  Thromboembolic ppx: SQ heparin  Head of bed >30 degrees: Yes  Ulcer ppx: Yes, home use continued  Glucose within target range: Yes, in range    Does patient need/have an active type/screen? Yes    RASS at goal? No - adjusting sedation or order to reflect need  [place] Agitation Assessment Scale (RASS) : -1 (09/18/2021  8:00 AM)     Can antipsychotics be stopped? N/A, not on antipsychotics  CAM-ICU Result: Positive (09/10/2021  8:00 PM)      Would hospice care be appropriate for this patient? No, patient improving or  expected to improve  Any unaddressed hospice/palliative care needs? no    Patient Lines/Drains/Airways Status     Active Active Lines, Drains, & Airways     Name Placement date Placement time Site Days    Hemodialysis Catheter With Distal Infusion Port 09/08/21 Right Internal jugular 09/08/21  1600  Internal jugular  9    NG/OG Tube Feedings Left nostril 09/14/21  1500  Left nostril  3    Urethral Catheter Temperature probe 16 Fr. 2021-09-29  1602  Temperature probe  10    Arteriovenous Fistula - Vein Graft  Access 11/01/18 Right;Upper Arm 11/01/18  --  Arm  1052              Patient Lines/Drains/Airways Status     Active Wounds     Name Placement date Placement time Site Days    Wound 09/09/21 Pressure Injury Sacrum Right;Mid dark red nonblanching with dark skin surrounding DTI 09/09/21  2130  Sacrum  8                Goals of Care     Code Status: Full Code    Designated Healthcare Decision Maker:  Mr. Behar current decisional capacity for healthcare decision-making is Full capacity. His designated healthcare decision maker(s) is/are   HCDM (patient stated preference): Buccellato,Katherine - Spouse - [phone].    Subjective   Sleepy, but awakes to voice and responds appropriately     Objective     Vitals - past 24 hours  Temp:  [36.7 ??C (98 ??F)-37.6 ??C (99.7 ??F)] 37.1 ??C (98.8 ??F)  Heart Rate:  [80-113] 106  SpO2 Pulse:  [76-118] 107  Resp:  [16-27] 26  BP: (117-181)/(46-144) 117/46  SpO2:  [94 %-100 %] 95 % Intake/Output  I/O last 3 completed shifts:  In: 3138.6 [I.V.:73.7; Blood:480; NG/GT:2365; IV Piggyback:220]  Out: 367 [Urine:367]     Physical Exam:    General: thin elderly male, in NAD   HEENT: PERRL, slightly icteric sclera w edema  CV: RRR, no m/r/g, 2+ BLE edema   Pulm: diminished throughout, scattered rhonchi, dec bases  GI: abd round, non tender  MSK: grossly intact, tone intact  Skin: grossly intact, warm  Neuro: FSC, CN grossly intact, non focal motor, BUE 2/5, BLE 2/5       Continuous Infusions:   ??? insulin regular 2.4 Units/hr (09/18/21 0941)       Scheduled Medications:   ??? aspirin  81 mg Enteral tube: gastric  Daily   ??? calcitRIOL  0.25 mcg Enteral tube: gastric  Q MWF   ??? cellulose, oxidized reg 1X 2 pad   Topical Once   ??? esomeprazole  40 mg Enteral tube: gastric  daily   ??? heparin (porcine)  5,000 Units Subcutaneous [facility]   ??? flu vacc qs2022-23 6mos up(PF)  0.5 mL Intramuscular During hospitalization   ??? levothyroxine  250 mcg Enteral tube: gastric  daily   ??? magnesium oxide  400 mg Enteral tube: gastric  BID   ??? predniSONE  10 mg Oral Daily   ??? [START ON 09/22/2021] predniSONE  5 mg Oral Daily   ??? sodium zirconium cyclosilicate  10 g Enteral tube: gastric  Daily   ??? Tacrolimus  1 mg Enteral tube: gastric  BID   ??? white petrolatum-mineral oil  1 application Both Eyes BID       PRN medications:  dextrose in water, dextrose in water, glucagon, glucose, heparin (porcine), heparin (porcine), ondansetron, sodium chloride  Data/Imaging Review: Reviewed in Epic and personally interpreted on 09/18/2021. See EMR for detailed results.         Critical Care Attestation     This patient is critically ill or injured with the impairment of vital organ systems such that there is a high probability of imminent or life threatening deterioration in the patient's condition. This patient must remain in the ICU for ongoing evaluation of the comprehensive management plan outlined in this note. I directly provided critical care services as documented in this note and the critical care time spent (50  min) is exclusive of separately billable procedures.  In addition to time spent for critical care management, I also provided advance care planning services for 0  minutes (see ACP note for details)???. Total billable critical care time 50  minutes.    Shin Lamour Paulino Door, PA

## 2021-09-18 NOTE — Unmapped (Signed)
MICU Nightshift Note     Date of Service: 09/18/2021    Principal Problem:    Acute respiratory distress syndrome (ARDS) due to COVID-19 virus (CMS-HCC)  Active Problems:    Hypercholesterolemia    Hypertension    History of kidney transplant    Renal cell carcinoma (CMS-HCC)    Immunosuppression-related infectious disease (CMS-HCC)    Bilateral lower extremity edema    Elevated troponin I level    GERD (gastroesophageal reflux disease)    Diastolic heart failure (CMS-HCC)    Acute on chronic HFrEF (heart failure with reduced ejection fraction) (CMS-HCC)    COVID    ATN (acute tubular necrosis) (CMS-HCC)    Acute renal failure with tubular necrosis (CMS-HCC)  Resolved Problems:    * No resolved hospital problems. *          Cross cover summary       Did well overnight. Initial potassium 6.3 to start the evening; started on insulin infusion for difficult to control hyperglycemia. Improvement in potassium to 5.8 subsequently. EKG without peaked t-waves nor QRS prolongation. Noted atrial flutter with variable block. Unchanged from previous EKG overall. Nephrology consulted for overnight iHD; deferred iHD trial to daytime. Intermittently hypertensive through the night; remained on room air.       Miguel Boyden, MD

## 2021-09-18 NOTE — Unmapped (Signed)
Plan of care reviewed, UF goal set at 2 liters as tolerated or a duration of 3 hours iHD. Crit line monitor on, RUA AVF accessed for iHD, no issues.

## 2021-09-18 NOTE — Unmapped (Signed)
Miguel Ward is oriented x 4 but remained drowsy entire shift.  CV:  Aflutter, MAP >65.  Resp:  Titrated to RA, requiring NT suctioning x 2 with copious, thick secretions.  Blood Glucose remains elevated and wasn't responsive to insulin regimen so Insulin gtt. to be initiated.  UO minimal, No BM.  Abdomen is distended, areas that have been injected with Insulin/Heparin administrations slowly leaking blood and serous fluid requiring frequent gown changes.  Wife and Sister visited at bedside today and have been updated on plan of care.      Problem: Fall Injury Risk  Goal: Absence of Fall and Fall-Related Injury  Outcome: Ongoing - Unchanged  Intervention: Promote Injury-Free Environment  Recent Flowsheet Documentation  Taken 09/17/2021 1600 by Kandis Fantasia, RN  Safety Interventions: aspiration precautions  Taken 09/17/2021 1400 by Kandis Fantasia, RN  Safety Interventions: aspiration precautions     Problem: Infection  Goal: Absence of Infection Signs and Symptoms  Outcome: Ongoing - Unchanged     Problem: Adult Inpatient Plan of Care  Goal: Plan of Care Review  Outcome: Ongoing - Unchanged  Goal: Patient-Specific Goal (Individualized)  Outcome: Ongoing - Unchanged  Goal: Absence of Hospital-Acquired Illness or Injury  Outcome: Ongoing - Unchanged  Intervention: Identify and Manage Fall Risk  Recent Flowsheet Documentation  Taken 09/17/2021 1600 by Kandis Fantasia, RN  Safety Interventions: aspiration precautions  Taken 09/17/2021 1400 by Kandis Fantasia, RN  Safety Interventions: aspiration precautions  Intervention: Prevent and Manage VTE (Venous Thromboembolism) Risk  Recent Flowsheet Documentation  Taken 09/17/2021 1600 by Kandis Fantasia, RN  VTE Prevention/Management: anticoagulant therapy  Goal: Optimal Comfort and Wellbeing  Outcome: Ongoing - Unchanged  Goal: Readiness for Transition of Care  Outcome: Ongoing - Unchanged  Goal: Rounds/Family Conference  Outcome: Ongoing - Unchanged     Problem: Skin Injury Risk Increased  Goal: Skin Health and Integrity  Outcome: Ongoing - Unchanged  Intervention: Optimize Skin Protection  Recent Flowsheet Documentation  Taken 09/17/2021 1600 by Kandis Fantasia, RN  Pressure Reduction Techniques: weight shift assistance provided  Head of Bed (HOB) Positioning: HOB at 30-45 degrees  Pressure Reduction Devices: pressure-redistributing mattress utilized  Taken 09/17/2021 1400 by Kandis Fantasia, RN  Pressure Reduction Techniques: weight shift assistance provided  Pressure Reduction Devices: pressure-redistributing mattress utilized     Problem: Dysrhythmia (Heart Failure)  Goal: Stable Heart Rate and Rhythm  Outcome: Ongoing - Unchanged     Problem: Fluid Imbalance (Heart Failure)  Goal: Fluid Balance  Outcome: Ongoing - Unchanged     Problem: Respiratory Compromise (Heart Failure)  Goal: Effective Oxygenation and Ventilation  Outcome: Ongoing - Unchanged      Problem: Nutrition Impairment (Mechanical Ventilation, Invasive)  Goal: Optimal Nutrition Delivery  Outcome: Resolved     Problem: Impaired Wound Healing  Goal: Optimal Wound Healing  Outcome: Ongoing - Unchanged     Problem: Self-Care Deficit  Goal: Improved Ability to Complete Activities of Daily Living  Outcome: Ongoing - Unchanged     Problem: Diabetes Comorbidity  Goal: Blood Glucose Level Within Targeted Range  Outcome: Not Progressing  - Blood sugars remain high, lastest one      Problem: Heart Failure Comorbidity  Goal: Maintenance of Heart Failure Symptom Control  Outcome: Ongoing - Unchanged     Problem: Hypertension Comorbidity  Goal: Blood Pressure in Desired Range  Outcome: Ongoing - Unchanged

## 2021-09-18 NOTE — Unmapped (Signed)
[facility] Hemodialysis Procedure Note     09/18/2021    Miguel Ward was seen and examined on hemodialysis    CHIEF COMPLAINT: Acute Kidney Disease    INTERVAL HISTORY: tolerating hemodialysis. no acute 24hr events. urine output .    DIALYSIS TREATMENT DATA:  Patient Goal Weight: 1.5L  Dialyzer: F-180 (98 mLs)  Dialysis Bath  Bath: 2 K+ / 2.5 Ca+  Dialysate Na (mEq/L): 137 mEq/L  Dialysate HCO3 (mEq/L): 35 mEq/L  Blood Flow Rate (mL/min): 400 mL/min  Dialysis Flow (mL/min): 800 mL/min    PHYSICAL EXAM:  Vitals:  Temp:  [36.7 ??C (98 ??F)-37.6 ??C (99.7 ??F)] 37.4 ??C (99.3 ??F)  Heart Rate:  [80-118] 100  BP: (126-181)/(50-144) 133/51  MAP (mmHg):  [75-156] 88    General: ill appearing currently dialyzing in micu bed  Pulmonary: coarse upper airway sounds  Cardiovascular: tachycardic  Extremities: no significant  edema  Access: RUE AV fistula    LAB DATA:  Lab Results   Component Value Date    NA 140 09/18/2021    K 4.4 09/18/2021    CL 106 09/18/2021    CO2 28.0 09/18/2021    BUN 93 (H) 09/18/2021    CREATININE 2.88 (H) 09/18/2021    CALCIUM 8.8 09/18/2021    MG 2.3 09/18/2021    PHOS 3.0 09/18/2021    ALBUMIN 1.7 (L) 09/18/2021      Lab Results   Component Value Date    HCT 22.7 (L) 09/18/2021    WBC 16.8 (H) 09/18/2021        ASSESSMENT/PLAN:  Acute Kidney Disease on Intermittent Hemodialysis:  UF goal: 1.5L as tolerated  Adjust medications for a GFR <10 ml/min  Avoid nephrotoxic agents  Last HD Treatment:Started (09/18/21)     Bone Mineral Metabolism:  Lab Results   Component Value Date    CALCIUM 8.8 09/18/2021    CALCIUM 8.8 09/17/2021    Lab Results   Component Value Date    ALBUMIN 1.7 (L) 09/18/2021    ALBUMIN 1.6 (L) 09/17/2021      Lab Results   Component Value Date    PHOS 3.0 09/18/2021    PHOS 3.7 09/17/2021    Lab Results   Component Value Date    PTH 566.1 (H) 07/30/2021    PTH 346.8 (H) 05/02/2020      Labs appropriate, no changes.    Anemia:   Lab Results   Component Value Date    HGB 7.4 (L) 09/18/2021    HGB 7.5 (L) 09/17/2021    HGB 6.6 (L) 09/17/2021    Iron Saturation (%)   Date Value Ref Range Status   01/08/2021 35 % Final   03/28/2014 47 20 - 50 % Final      Lab Results   Component Value Date    FERRITIN 406.9 (H) 01/08/2021       iron panel, ferritin. potential epoetin initiation if requiring sustained renal replacement therapy.    Vascular Access:  Vascular Access functioning well - no need for intervention  Blood Flow Rate (mL/min): 400 mL/min    immunosuppression: tacrolimus 1mg  suspension bid; 12hr trough goal 2-4 ng/ml. prednisone taper 10mg  daily x5d, then 5mg  daily.    Prince Rome, MD  [facility] Division of Nephrology & Hypertension

## 2021-09-18 NOTE — Unmapped (Signed)
HEMODIALYSIS NURSE PROCEDURE NOTE       Treatment Number:  1 Room / Station:  [facility] Unit & Room) (MICU 6)    Procedure Date:  09/18/21 Device Name/Number: Harrold Donath    Total Dialysis Treatment Time:  206 Min.    CONSENT:    Written consent was obtained prior to the procedure and is detailed in the medical record.  Prior to the start of the procedure, a time out was taken and the identity of the patient was confirmed via name, medical record number and date of birth.     WEIGHT:  Hemodialysis Pre-Treatment Weights     Date/Time Pre-Treatment Weight (kg) Estimated Dry Weight (kg) Patient Goal Weight (kg) Total Goal Weight (kg)    09/18/21 0817 --  utw --  tbd 2 kg (4 lb 6.6 oz)  Dr. Toni Arthurs rounding, UF goal lowered to 1.5 liters/ done. 2.55 kg (5 lb 10 oz)         Hemodialysis Post Treatment Weights     Date/Time Post-Treatment Weight (kg) Treatment Weight Change (kg)    09/18/21 1223 --  utw --        Active Dialysis Orders (168h ago, onward)     Start     Ordered    09/18/21 1003  Hemodialysis inpatient  Every Tue,Thu,Sat      Question Answer Comment   Patient HD Status: Acute    New Start? Yes was on CRRT until 09/16/21   K+ 2 meq/L    Ca++ 2.5 meq/L    Bicarb 35 meq/L    Na+ 137 meq/L    Na+ Modeling no    Dialyzer [id number]    Dialysate Temperature (C) 36    BFR-As tolerated to a maximum of: 400 mL/min    DFR 800 mL/min    Duration of treatment 3 Hr    Dry weight (kg) to be determined    Challenge dry weight (kg) no    Fluid removal (L) 1.5L 09/18/21    Tubing Adult = 142 ml    Access Site AVF    Access Site Location Right        09/18/21 1003    09/18/21 0700  Hemodialysis inpatient  Every Tue,Thu,Sat,   Status:  Canceled      Question Answer Comment   Patient HD Status: Acute    New Start? Yes was on CRRT until 09/16/21   K+ 2 meq/L    Ca++ 2.25 meq/L    Bicarb 35 meq/L    Na+ 137 meq/L    Na+ Modeling no    Dialyzer [id number]    Dialysate Temperature (C) 36    BFR-As tolerated to a maximum of: 400 mL/min DFR 800 mL/min    Duration of treatment 3 Hr    Dry weight (kg) to be determined    Challenge dry weight (kg) no    Fluid removal (L) 2L on 09/18/21    Tubing Adult = 142 ml    Access Site AVF    Access Site Location Right        09/17/21 1128              ASSESSMENT:  General appearance: no distress  Neurologic: Grossly normal  Lungs: clear to auscultation bilaterally and normal percussion bilaterally  Heart: regular rate and rhythm, S1, S2 normal, no murmur, click, rub or gallop  Abdomen: soft, non-tender; bowel sounds normal; no masses,  no organomegaly  Pulses: 2+ and symmetric.  Skin: Skin color, texture, turgor normal. No rashes or lesions    ACCESS SITE:          Hemodialysis Catheter With Distal Infusion Port 09/08/21 Right Internal jugular (Active)   Site Assessment Clean;Dry;Intact 09/18/21 0800   Proximal Lumen Status / Patency Heparin locked 09/18/21 0800   Proximal Lumen Intervention Deaccessed 09/18/21 0800   Medial Lumen Status / Patency Heparin Locked 09/18/21 0800   Medial Lumen Intervention Deaccessed 09/18/21 0800   Lumen 3, Distal Status / Patency Blood Return - Brisk 09/18/21 0800   Distal Lumen Intervention Patent 09/18/21 0800   IV Tubing and Needleless Injector Cap Change Due 09/17/21 09/18/21 0800   Dressing Type CHG gel;Transparent;Occlusive 09/18/21 0800   Dressing Status      Clean;Dry;Intact/not removed 09/18/21 0800   Dressing Intervention No intervention needed 09/18/21 0800   Contraindicated due to: Dressing Intact surrounding insertion site 09/18/21 0800   Dressing Change Due 2021-10-15 09/18/21 0800   Line Necessity Reviewed? Y 09/18/21 0800   Line Necessity Indications Yes - Medications requiring central line access (Consult Pharmacy PRN) 09/18/21 0800   Line Necessity Reviewed With mdi 09/18/21 0800     Arteriovenous Fistula - Vein Graft  Access 11/01/18 Right;Upper Arm (Active)   Site Assessment Clean;Dry;Intact 09/18/21 1222   AV Fistula Thrill Present;Bruit Present 09/18/21 1222 Status Deaccessed 09/18/21 1222   Dressing Intervention New dressing 09/18/21 1222   Dressing Status      Clean;Dry;Intact/not removed 09/18/21 1222   Site Condition No complications 09/18/21 1222   Dressing Gauze;Occlusive 09/18/21 1222   Dressing To Be Removed (Date/Time) 4-6 hours post HD 09/18/21 1222     Catheter fill volumes:    Arterial:  mL Venous:  mL   Catheter filled with  post procedure.     Patient Lines/Drains/Airways Status     Active Peripheral & Central Intravenous Access     Name Placement date Placement time Site Days    Hemodialysis Catheter With Distal Infusion Port 09/08/21 Right Internal jugular 09/08/21  1600  Internal jugular  9               LAB RESULTS:  Lab Results   Component Value Date    NA 140 09/18/2021    K 4.4 09/18/2021    CL 106 09/18/2021    CO2 28.0 09/18/2021    BUN 93 (H) 09/18/2021    CREATININE 2.88 (H) 09/18/2021    GLU 165 09/18/2021    GLUF 92 01/22/2015    CALCIUM 8.8 09/18/2021    CAION 5.07 09/18/2021    PHOS 3.0 09/18/2021    MG 2.3 09/18/2021    PTH 566.1 (H) 07/30/2021    IRON 72 01/08/2021    LABIRON 35 01/08/2021    TRANSFERRIN 163 (L) 03/28/2014    FERRITIN 2,080.9 (H) 09/18/2021    TIBC 207 (L) 01/08/2021     Lab Results   Component Value Date    WBC 16.8 (H) 09/18/2021    HGB 7.4 (L) 09/18/2021    HCT 22.7 (L) 09/18/2021    PLT 113 (L) 09/18/2021    PHART 7.44 09/10/2021    PO2ART 76.2 (L) 09/10/2021    PCO2ART 32.1 (L) 09/10/2021    HCO3ART 22 09/10/2021    BEART -1.9 09/10/2021    O2SATART 96.6 09/10/2021    APTT 36.9 (H) 09/18/2021        VITAL SIGNS:   Temperature     Date/Time Temp Temp src  09/18/21 1216 -- Bladder     09/18/21 1000 37.1 ??C (98.8 ??F) --         Hemodynamics     Date/Time Pulse BP MAP (mmHg) Arterial Line BP    09/18/21 1216 96 134/46 -- --    09/18/21 1145 98 134/48 -- --    09/18/21 1130 89 116/44 -- --    09/18/21 1115 93 115/48 -- --    09/18/21 1100 90 118/44 -- --    09/18/21 1045 88 114/46 -- --    09/18/21 1030 100 111/44 -- --    09/18/21 1015 106 117/46 -- --    09/18/21 1000 101  Simultaneous filing. User may not have seen previous data. 119/52 72 --    09/18/21 0945 100 133/51 -- --    09/18/21 0930 96 134/64 -- --    09/18/21 0915 91 144/71 -- --    09/18/21 0900 106 136/67 86 --    09/18/21 0850 94 155/68 -- --    09/18/21 0835 91 -- -- --    09/18/21 0816 92 162/73 88 --    09/18/21 0800 108 162/73 105 --    Date/Time Arterial Line MAP Arterial Line BP 2 Arterial Line MAP Patient Position    09/18/21 1216 -- -- -- Lying    09/18/21 1145 -- -- -- Lying    09/18/21 1130 -- -- -- Lying    09/18/21 1115 -- -- -- Lying    09/18/21 1100 -- -- -- Lying    09/18/21 1045 -- -- -- Lying    09/18/21 1030 -- -- -- Lying    09/18/21 1015 -- -- -- Lying    09/18/21 1000 -- -- -- Lying    09/18/21 0945 -- -- -- Lying    09/18/21 0930 -- -- -- Lying    09/18/21 0915 -- -- -- Lying    09/18/21 0900 -- -- -- Lying    09/18/21 0850 -- -- -- Lying    09/18/21 0835 -- -- -- --    09/18/21 0816 -- -- -- Lying    09/18/21 0800 -- -- -- Lying        Blood Volume Monitor     Date/Time Blood Volume Change (%) HCT HGB Critline O2 SAT %    09/18/21 1145 -4.6 % 24.5 8.3 95.6    09/18/21 1130 -4.1 % 24.4 8.3 94.9    09/18/21 1115 -4.5 % 24.5 8.3 94.8    09/18/21 1045 -6.1 % 24.8 8.4 92.5    09/18/21 1030 -11.4 % 26.4 9 93    09/18/21 1015 -11.2 % 26.3 8.9 93.3    09/18/21 1000 -11 % 26.3 8.9 93.6    09/18/21 0945 -11 % 26.3 8.9 94    09/18/21 0930 -10.1 % 25.9 8.5 94.4        Oxygen Therapy     Date/Time Resp SpO2 O2 Device O2 Flow Rate (L/min)    09/18/21 1216 16 -- None (Room air) --    09/18/21 1145 17 -- None (Room air) --    09/18/21 1130 17 96 % None (Room air) --    09/18/21 1115 17 96 % None (Room air) --    09/18/21 1100 18 -- None (Room air) --    09/18/21 1045 17 94 % None (Room air) --    09/18/21 1030 24 -- None (Room air) --    09/18/21 1015 26 -- None (Room air) --    09/18/21  1000 20  Simultaneous filing. User may not have seen previous data. 95 % None (Room air) --    09/18/21 0945 21 -- None (Room air) --    09/18/21 0930 18 -- None (Room air) --          Pre-Hemodialysis Assessment     Date/Time Therapy Number Dialyzer Hemodialysis Line Type All Machine Alarms Passed    09/18/21 0817 1 F-180 (98 mLs) Adult (142 m/s) Yes    Date/Time Air Detector Saline Line Double Clampled Hemo-Safe Applied Dialysis Flow (mL/min)    09/18/21 0817 Engaged -- -- 800 mL/min    Date/Time Verify Priming Solution Priming Volume Hemodialysis Independent pH Hemodialysis Machine Conductivity (mS/cm)    09/18/21 0817 0.9% NS 300 mL --  n/a 13.7 mS/cm    Date/Time Hemodialysis Independent Conductivity (mS/cm) Bicarb Conductivity Residual Bleach Negative Total Chlorine    09/18/21 0817 13.7 mS/cm -- Yes 0        Pre-Hemodialysis Treatment Comments     Date/Time Pre-Hemodialysis Comments    09/18/21 0817 awake, NAD, VSS.        Hemodialysis Treatment     Date/Time Blood Flow Rate (mL/min) Arterial Pressure (mmHg) Venous Pressure (mmHg) Transmembrane Pressure (mmHg)    09/18/21 1216 400 mL/min -160 mmHg 140 mmHg 50 mmHg    09/18/21 1145 400 mL/min -160 mmHg 140 mmHg 50 mmHg    09/18/21 1130 400 mL/min -160 mmHg 130 mmHg 50 mmHg    09/18/21 1115 400 mL/min -160 mmHg 120 mmHg 50 mmHg    09/18/21 1100 400 mL/min -170 mmHg 130 mmHg 50 mmHg    09/18/21 1045 400 mL/min -130 mmHg 120 mmHg 70 mmHg    09/18/21 1030 400 mL/min -130 mmHg 130 mmHg 70 mmHg    09/18/21 1015 400 mL/min -170 mmHg 130 mmHg 70 mmHg    09/18/21 1000 400 mL/min -170 mmHg 130 mmHg 70 mmHg    09/18/21 0945 400 mL/min -160 mmHg 130 mmHg 70 mmHg    09/18/21 0930 400 mL/min -160 mmHg 130 mmHg 80 mmHg    09/18/21 0915 400 mL/min -150 mmHg 120 mmHg 90 mmHg    09/18/21 0900 400 mL/min -150 mmHg 120 mmHg 70 mmHg    09/18/21 0850 400 mL/min -140 mmHg 120 mmHg 70 mmHg    Date/Time Ultrafiltration Rate (mL/hr) Ultrafiltrate Removed (mL) Dialysate Flow Rate (mL/min) KECN (Kecn)    09/18/21 1216 40 mL/hr 1750 mL 800 ml/min -- 09/18/21 1145 40 mL/hr 1735 mL 800 ml/min --    09/18/21 1130 40 mL/hr 1729 mL 800 ml/min --    09/18/21 1115 40 mL/hr 1725 mL 800 ml/min --    09/18/21 1100 40 mL/hr 1117 mL 800 ml/min --    09/18/21 1045 0 mL/hr 1120 mL 800 ml/min --    09/18/21 1030 350 mL/hr 1120 mL 800 ml/min --    09/18/21 1015 670 mL/hr 979 mL 800 ml/min --    09/18/21 1000 670 mL/hr 825 mL 800 ml/min --    09/18/21 0945 670 mL/hr 650 mL 800 ml/min --    09/18/21 0930 670 mL/hr 475 mL 800 ml/min --    09/18/21 0915 870 mL/hr 245 mL 800 ml/min --    09/18/21 0900 870 mL/hr 78 mL 800 ml/min --    09/18/21 0850 870 mL/hr 0 mL 800 ml/min --        Hemodialysis Treatment Comments     Date/Time Intra-Hemodialysis Comments    09/18/21 1216 Tx completed, NAD, VSS  09/18/21 1145 VS stable, Dr. Toni Arthurs reassessed.    09/18/21 1130 NAD    09/18/21 1115 VS stable    09/18/21 1100 NAD    09/18/21 1045 Albumin 25 gm given per Dr. Toni Arthurs d/t MAP trending down.    09/18/21 1030 Pt resting, NAD    09/18/21 1015 VS stable    09/18/21 1000 BP trends down, will monitor closely.    09/18/21 0945 NAD    09/18/21 0930 VS stable, will continue to monitor.    09/18/21 0920 UF goal changed to 1.5 liters per Dr. Toni Arthurs.    09/18/21 0915 NAD    09/18/21 0900 Pt awake, denies pain, will continue to monitor.    09/18/21 0850 Tx started, VS stable, no issues.        Post Treatment     Date/Time Rinseback Volume (mL) On Line Clearance: spKt/V Total Liters Processed (L/min) Dialyzer Clearance    09/18/21 1223 300 mL -- 64.8 L/min Lightly streaked        Post Hemodialysis Treatment Comments     Date/Time Post-Hemodialysis Comments    09/18/21 1223 Albumin 25 gm given for hypotension, VSS post HD. NAD.        Hemodialysis I/O     Date/Time Total Hemodialysis Replacement Volume (mL) Total Ultrafiltrate Output (mL)    09/18/21 1223 -- 700 mL          [id number] - Medicaitons Given During Treatment  (last 3 hrs)         ASPEN Lollie Sails, RN       Medication Name Action Time Action Route Rate Dose User     insulin regular (HumuLIN,NovoLIN) 100 Units in sodium chloride (NS) 0.9 % 100 mL infusion 09/18/21 0941 Rate/Dose Change Intravenous 2.4 mL/hr 2.4 Units/hr [employer], RN     insulin regular (HumuLIN,NovoLIN) 100 Units in sodium chloride (NS) 0.9 % 100 mL infusion 09/18/21 1141 Rate/Dose Change Intravenous 1.5 mL/hr 1.5 Units/hr York Spaniel, RN          Burna Mortimer Isidor Holts, RN       Medication Name Action Time Action Route Rate Dose User     albumin human 25 % bottle 25 g 09/18/21 1042 New Bag Intravenous  25 g Ripley Fraise, RN                  Patient tolerated treatment in a  Bed.

## 2021-09-18 NOTE — Unmapped (Signed)
Received Miguel Ward&O X 3 on room air, hard to understand sometimes. VSS and afebrile.  EKG done for K+ of 6.3.  Per EKG read, no change from previous EKG.  Miguel oozes blood from all old puncture sites, heparin shots, old lines, insulin shots, etc.  Miguel remains on insulin gtt with good control and K+ is down to 5.8.  Miguel's has and abundance of blood tinged thick secretions that has to be NT suctioned out of the back of his throat, this was done three times overnight by RT.  NAD noted overnight, plan for iHD today.    Problem: Fall Injury Risk  Goal: Absence of Fall and Fall-Related Injury  Outcome: Ongoing - Unchanged  Intervention: Promote Injury-Free Environment  Recent Flowsheet Documentation  Taken 09/17/2021 2000 by [place] Ward Janice Seales, RN  Safety Interventions:   aspiration precautions   bleeding precautions   enteral feeding safety   environmental modification   infection management   lighting adjusted for tasks/safety   low bed    Problem: Infection  Goal: Absence of Infection Signs and Symptoms  Outcome: Ongoing - Unchanged  Intervention: Prevent or Manage Infection  Recent Flowsheet Documentation  Taken 09/17/2021 2000 by [place] Ward Vitor Overbaugh, RN  Infection Management: aseptic technique maintained     Problem: Adult Inpatient Plan of Care  Goal: Plan of Care Review  Outcome: Ongoing - Unchanged  Goal: Patient-Specific Goal (Individualized)  Outcome: Ongoing - Unchanged  Goal: Absence of Hospital-Acquired Illness or Injury  Outcome: Ongoing - Unchanged  Intervention: Identify and Manage Fall Risk  Recent Flowsheet Documentation  Taken 09/17/2021 2000 by [place] Ward Antwoine Zorn, RN  Safety Interventions:   aspiration precautions   bleeding precautions   enteral feeding safety   environmental modification   infection management   lighting adjusted for tasks/safety   low bed  Intervention: Prevent Skin Injury  Recent Flowsheet Documentation  Taken 09/17/2021 2000 by [place] Ward Katesha Eichel, RN  Skin Protection:   adhesive use limited   incontinence pads utilized   skin-to-skin areas padded   transparent dressing maintained   tubing/devices free from skin contact  Intervention: Prevent and Manage VTE (Venous Thromboembolism) Risk  Recent Flowsheet Documentation  Taken 09/18/2021 0600 by [place] Ward Seaira Byus, RN  Activity Management: bedrest  Taken 09/18/2021 0400 by [place] Ward Hartleigh Edmonston, RN  Activity Management: bedrest  Taken 09/18/2021 0200 by [place] Ward Redmond Whittley, RN  Activity Management: bedrest  Taken 09/18/2021 0000 by [place] Ward Amer Alcindor, RN  Activity Management: bedrest  Taken 09/17/2021 2200 by [place] Ward Harlow Basley, RN  Activity Management: bedrest  Taken 09/17/2021 2000 by [place] Ward Adalai Perl, RN  Activity Management: bedrest  Range of Motion: Bilateral Upper and Lower Extremities  Intervention: Prevent Infection  Recent Flowsheet Documentation  Taken 09/17/2021 2000 by [place] Ward Marcena Dias, RN  Infection Prevention:   cohorting utilized   environmental surveillance performed   equipment surfaces disinfected   hand hygiene promoted   personal protective equipment utilized   rest/sleep promoted   single patient room provided   visitors restricted/screened  Goal: Optimal Comfort and Wellbeing  Outcome: Ongoing - Unchanged  Goal: Readiness for Transition of Care  Outcome: Ongoing - Unchanged  Goal: Rounds/Family Conference  Outcome: Ongoing - Unchanged     Problem: Skin Injury Risk Increased  Goal: Skin Health and Integrity  Outcome: Ongoing - Unchanged  Intervention: Optimize Skin Protection  Recent Flowsheet Documentation  Taken 09/18/2021 0600 by [place] Ward Audie Stayer, RN  Head of Bed [facility]) Positioning: HOB at 30-45  degrees  Taken 09/18/2021 0400 by [place] Ward Nalaysia Manganiello, RN  Head of Bed [facility]) Positioning: HOB at 30-45 degrees  Taken 09/17/2021 2000 by [place] Ward Keimari Ogden, RN  Pressure Reduction Techniques:   heels elevated off bed   weight shift assistance provided  Head of Bed (HOB) Positioning: HOB at 30-45 degrees  Pressure Reduction Devices: pressure-redistributing mattress utilized  Skin Protection:   adhesive use limited   incontinence pads utilized   skin-to-skin areas padded   transparent dressing maintained   tubing/devices free from skin contact     Problem: Dysrhythmia (Heart Failure)  Goal: Stable Heart Rate and Rhythm  Outcome: Ongoing - Unchanged     Problem: Fluid Imbalance (Heart Failure)  Goal: Fluid Balance  Outcome: Ongoing - Unchanged     Problem: Respiratory Compromise (Heart Failure)  Goal: Effective Oxygenation and Ventilation  Outcome: Ongoing - Unchanged     Problem: Device-Related Complication Risk (Mechanical Ventilation, Invasive)  Goal: Optimal Device Function  Intervention: Optimize Device Care and Function  Recent Flowsheet Documentation  Taken 09/17/2021 2000 by [place] Ward Slade [place], RN  Aspiration Precautions:   NPO pending swallow screening/evaluation   respiratory status monitored   tube feeding placement verified   upright posture maintained     Problem: Impaired Wound Healing  Goal: Optimal Wound Healing  Outcome: Ongoing - Unchanged  Intervention: Promote Wound Healing  Recent Flowsheet Documentation  Taken 09/18/2021 0600 by [place] Ward Satvik Parco, RN  Activity Management: bedrest  Taken 09/18/2021 0400 by [place] Ward Kerrin Markman, RN  Activity Management: bedrest  Taken 09/18/2021 0200 by [place] Ward Kayleana Waites, RN  Activity Management: bedrest  Taken 09/18/2021 0000 by [place] Ward Yamil Oelke, RN  Activity Management: bedrest  Taken 09/17/2021 2200 by [place] Ward Duwayne Matters, RN  Activity Management: bedrest  Taken 09/17/2021 2000 by [place] Ward Ryann Leavitt, RN  Activity Management: bedrest     Problem: Self-Care Deficit  Goal: Improved Ability to Complete Activities of Daily Living  Outcome: Ongoing - Unchanged     Problem: Diabetes Comorbidity  Goal: Blood Glucose Level Within Targeted Range  Outcome: Ongoing - Unchanged     Problem: Heart Failure Comorbidity  Goal: Maintenance of Heart Failure Symptom Control  Outcome: Ongoing - Unchanged     Problem: Hypertension Comorbidity  Goal: Blood Pressure in Desired Range  Outcome: Ongoing - Unchanged

## 2021-09-19 LAB — BLOOD GAS CRITICAL CARE PANEL, VENOUS
BASE EXCESS VENOUS: 6.9 — ABNORMAL HIGH (ref -2.0–2.0)
BASE EXCESS VENOUS: 6.6 — ABNORMAL HIGH (ref -2.0–2.0)
BASE EXCESS VENOUS: 6.5 — ABNORMAL HIGH (ref -2.0–2.0)
CALCIUM IONIZED VENOUS (MG/DL): 5.07 mg/dL (ref 4.40–5.40)
CALCIUM IONIZED VENOUS (MG/DL): 4.95 mg/dL (ref 4.40–5.40)
CALCIUM IONIZED VENOUS (MG/DL): 5.04 mg/dL (ref 4.40–5.40)
GLUCOSE WHOLE BLOOD: 131 mg/dL (ref 70–179)
GLUCOSE WHOLE BLOOD: 202 mg/dL — ABNORMAL HIGH (ref 70–179)
GLUCOSE WHOLE BLOOD: 130 mg/dL (ref 70–179)
HCO3 VENOUS: 32 mmol/L — ABNORMAL HIGH (ref 22–27)
HCO3 VENOUS: 32 mmol/L — ABNORMAL HIGH (ref 22–27)
HCO3 VENOUS: 32 mmol/L — ABNORMAL HIGH (ref 22–27)
HEMOGLOBIN BLOOD GAS: 7.3 g/dL — ABNORMAL LOW
HEMOGLOBIN BLOOD GAS: 9.5 g/dL — ABNORMAL LOW (ref 13.50–17.50)
HEMOGLOBIN BLOOD GAS: 8 g/dL — ABNORMAL LOW (ref 13.50–17.50)
LACTATE BLOOD VENOUS: 1.2 mmol/L (ref 0.5–1.8)
LACTATE BLOOD VENOUS: 1.4 mmol/L (ref 0.5–1.8)
LACTATE BLOOD VENOUS: 1.2 mmol/L (ref 0.5–1.8)
O2 SATURATION VENOUS: 68.9 % (ref 40.0–85.0)
O2 SATURATION VENOUS: 65.1 % (ref 40.0–85.0)
O2 SATURATION VENOUS: 73.8 % (ref 40.0–85.0)
PCO2 VENOUS: 50 mmHg (ref 40–60)
PCO2 VENOUS: 47 mmHg (ref 40–60)
PCO2 VENOUS: 49 mmHg (ref 40–60)
PH VENOUS: 7.42 (ref 7.32–7.43)
PH VENOUS: 7.43 (ref 7.32–7.43)
PH VENOUS: 7.42 (ref 7.32–7.43)
PO2 VENOUS: 37 mmHg (ref 30–55)
PO2 VENOUS: 34 mmHg (ref 30–55)
PO2 VENOUS: 39 mmHg (ref 30–55)
POTASSIUM WHOLE BLOOD: 5 mmol/L — ABNORMAL HIGH (ref 3.4–4.6)
POTASSIUM WHOLE BLOOD: 5.3 mmol/L — ABNORMAL HIGH (ref 3.4–4.6)
POTASSIUM WHOLE BLOOD: 5.1 mmol/L — ABNORMAL HIGH (ref 3.4–4.6)
SODIUM WHOLE BLOOD: 140 mmol/L (ref 135–145)
SODIUM WHOLE BLOOD: 140 mmol/L (ref 135–145)
SODIUM WHOLE BLOOD: 141 mmol/L (ref 135–145)

## 2021-09-19 LAB — CBC
HEMATOCRIT: 22.1 % — ABNORMAL LOW (ref 39.0–48.0)
HEMATOCRIT: 22.4 % — ABNORMAL LOW (ref 39.0–48.0)
HEMOGLOBIN: 7.1 g/dL — ABNORMAL LOW (ref 12.9–16.5)
HEMOGLOBIN: 7.3 g/dL — ABNORMAL LOW (ref 12.9–16.5)
MEAN CORPUSCULAR HEMOGLOBIN CONC: 32.2 g/dL (ref 32.0–36.0)
MEAN CORPUSCULAR HEMOGLOBIN CONC: 32.7 g/dL (ref 32.0–36.0)
MEAN CORPUSCULAR HEMOGLOBIN: 28.4 pg (ref 25.9–32.4)
MEAN CORPUSCULAR HEMOGLOBIN: 28.4 pg (ref 25.9–32.4)
MEAN CORPUSCULAR VOLUME: 86.9 fL (ref 77.6–95.7)
MEAN CORPUSCULAR VOLUME: 88.1 fL (ref 77.6–95.7)
MEAN PLATELET VOLUME: 10.9 fL — ABNORMAL HIGH (ref 6.8–10.7)
MEAN PLATELET VOLUME: 11 fL — ABNORMAL HIGH (ref 6.8–10.7)
PLATELET COUNT: 104 10*9/L — ABNORMAL LOW (ref 150–450)
PLATELET COUNT: 109 10*9/L — ABNORMAL LOW (ref 150–450)
RED BLOOD CELL COUNT: 2.5 10*12/L — ABNORMAL LOW (ref 4.26–5.60)
RED BLOOD CELL COUNT: 2.58 10*12/L — ABNORMAL LOW (ref 4.26–5.60)
RED CELL DISTRIBUTION WIDTH: 16.1 % — ABNORMAL HIGH (ref 12.2–15.2)
RED CELL DISTRIBUTION WIDTH: 16.4 % — ABNORMAL HIGH (ref 12.2–15.2)
WBC ADJUSTED: 15.1 10*9/L — ABNORMAL HIGH (ref 3.6–11.2)
WBC ADJUSTED: 17 10*9/L — ABNORMAL HIGH (ref 3.6–11.2)

## 2021-09-19 LAB — ENDOTOOL
ENDOTOOL GLUCOSE: 121 mg/dL — ABNORMAL LOW (ref 140–180)
ENDOTOOL GLUCOSE: 128 mg/dL — ABNORMAL LOW (ref 140–180)
ENDOTOOL GLUCOSE: 120 mg/dL — ABNORMAL LOW (ref 140–180)
ENDOTOOL GLUCOSE: 135 mg/dL — ABNORMAL LOW (ref 140–180)
ENDOTOOL GLUCOSE: 136 mg/dL — ABNORMAL LOW (ref 140–180)
ENDOTOOL GLUCOSE: 143 mg/dL (ref 140–180)
ENDOTOOL GLUCOSE: 119 mg/dL — ABNORMAL LOW (ref 140–180)
ENDOTOOL GLUCOSE: 148 mg/dL (ref 140–180)
ENDOTOOL GLUCOSE: 175 mg/dL (ref 140–180)
ENDOTOOL GLUCOSE: 178 mg/dL (ref 140–180)
ENDOTOOL INSULIN RATE: 0.7 U/h
ENDOTOOL INSULIN RATE: 0.8 U/h
ENDOTOOL INSULIN RATE: 0.6 U/h
ENDOTOOL INSULIN RATE: 0.8 U/h
ENDOTOOL INSULIN RATE: 0.8 U/h
ENDOTOOL INSULIN RATE: 0.9 U/h
ENDOTOOL INSULIN RATE: 0 U/h
ENDOTOOL INSULIN RATE: 0.9 U/h
ENDOTOOL INSULIN RATE: 1.5 U/h
ENDOTOOL INSULIN RATE: 1.6 U/h

## 2021-09-19 LAB — BASIC METABOLIC PANEL
ANION GAP: 7 mmol/L (ref 5–14)
ANION GAP: 7 mmol/L (ref 5–14)
BLOOD UREA NITROGEN: 73 mg/dL — ABNORMAL HIGH (ref 9–23)
BLOOD UREA NITROGEN: 91 mg/dL — ABNORMAL HIGH (ref 9–23)
BUN / CREAT RATIO: 34
BUN / CREAT RATIO: 37
CALCIUM: 8.6 mg/dL — ABNORMAL LOW (ref 8.7–10.4)
CALCIUM: 8.7 mg/dL (ref 8.7–10.4)
CHLORIDE: 103 mmol/L (ref 98–107)
CHLORIDE: 104 mmol/L (ref 98–107)
CO2: 31 mmol/L (ref 20.0–31.0)
CO2: 30 mmol/L (ref 20.0–31.0)
CREATININE: 2.17 mg/dL — ABNORMAL HIGH
CREATININE: 2.49 mg/dL — ABNORMAL HIGH
EGFR CKD-EPI (2021) MALE: 33 mL/min/{1.73_m2} — ABNORMAL LOW (ref >=60)
EGFR CKD-EPI (2021) MALE: 28 mL/min/{1.73_m2} — ABNORMAL LOW (ref >=60)
GLUCOSE RANDOM: 137 mg/dL (ref 70–179)
GLUCOSE RANDOM: 201 mg/dL — ABNORMAL HIGH (ref 70–179)
POTASSIUM: 5.1 mmol/L — ABNORMAL HIGH (ref 3.4–4.8)
POTASSIUM: 5.6 mmol/L — ABNORMAL HIGH (ref 3.4–4.8)
SODIUM: 141 mmol/L (ref 135–145)
SODIUM: 141 mmol/L (ref 135–145)

## 2021-09-19 LAB — HEPATITIS B CORE ANTIBODY, IGM: HEPATITIS B CORE IGM ANTIBODY: NONREACTIVE

## 2021-09-19 LAB — CBC W/ AUTO DIFF
BASOPHILS ABSOLUTE COUNT: 0.1 10*9/L (ref 0.0–0.1)
BASOPHILS RELATIVE PERCENT: 0.3 %
EOSINOPHILS ABSOLUTE COUNT: 0.1 10*9/L (ref 0.0–0.5)
EOSINOPHILS RELATIVE PERCENT: 0.5 %
HEMATOCRIT: 22.9 % — ABNORMAL LOW (ref 39.0–48.0)
HEMOGLOBIN: 7.6 g/dL — ABNORMAL LOW (ref 12.9–16.5)
LYMPHOCYTES ABSOLUTE COUNT: 1.1 10*9/L (ref 1.1–3.6)
LYMPHOCYTES RELATIVE PERCENT: 6.9 %
MEAN CORPUSCULAR HEMOGLOBIN CONC: 33 g/dL (ref 32.0–36.0)
MEAN CORPUSCULAR HEMOGLOBIN: 28.6 pg (ref 25.9–32.4)
MEAN CORPUSCULAR VOLUME: 86.6 fL (ref 77.6–95.7)
MEAN PLATELET VOLUME: 10.4 fL (ref 6.8–10.7)
MONOCYTES ABSOLUTE COUNT: 0.9 10*9/L — ABNORMAL HIGH (ref 0.3–0.8)
MONOCYTES RELATIVE PERCENT: 5.9 %
NEUTROPHILS ABSOLUTE COUNT: 13.2 10*9/L — ABNORMAL HIGH (ref 1.8–7.8)
NEUTROPHILS RELATIVE PERCENT: 86.4 %
PLATELET COUNT: 108 10*9/L — ABNORMAL LOW (ref 150–450)
RED BLOOD CELL COUNT: 2.64 10*12/L — ABNORMAL LOW (ref 4.26–5.60)
RED CELL DISTRIBUTION WIDTH: 15.7 % — ABNORMAL HIGH (ref 12.2–15.2)
WBC ADJUSTED: 15.3 10*9/L — ABNORMAL HIGH (ref 3.6–11.2)

## 2021-09-19 LAB — HEPATIC FUNCTION PANEL
ALBUMIN: 2 g/dL — ABNORMAL LOW (ref 3.4–5.0)
ALKALINE PHOSPHATASE: 641 U/L — ABNORMAL HIGH (ref 46–116)
ALT (SGPT): 434 U/L — ABNORMAL HIGH (ref 10–49)
AST (SGOT): 597 U/L — ABNORMAL HIGH (ref <=34)
BILIRUBIN DIRECT: 0.6 mg/dL — ABNORMAL HIGH (ref 0.00–0.30)
BILIRUBIN TOTAL: 0.8 mg/dL (ref 0.3–1.2)
PROTEIN TOTAL: 5 g/dL — ABNORMAL LOW (ref 5.7–8.2)

## 2021-09-19 LAB — MAGNESIUM
MAGNESIUM: 2.1 mg/dL (ref 1.6–2.6)
MAGNESIUM: 2.2 mg/dL (ref 1.6–2.6)

## 2021-09-19 LAB — HEPATITIS B SURFACE ANTIBODY
HEPATITIS B SURFACE ANTIBODY QUANT: 32.86 m[IU]/mL — ABNORMAL HIGH (ref <8.00)
HEPATITIS B SURFACE ANTIBODY: REACTIVE — AB

## 2021-09-19 LAB — TACROLIMUS LEVEL, TROUGH: TACROLIMUS, TROUGH: 2 ng/mL — ABNORMAL LOW (ref 5.0–15.0)

## 2021-09-19 LAB — CK: CREATINE KINASE TOTAL: 131 U/L

## 2021-09-19 LAB — PHOSPHORUS
PHOSPHORUS: 3.3 mg/dL (ref 2.4–5.1)
PHOSPHORUS: 4.1 mg/dL (ref 2.4–5.1)

## 2021-09-19 MED ADMIN — aspirin chewable tablet 81 mg: 81.0 mg | GASTROENTERAL | @ 12:00:00

## 2021-09-19 MED ADMIN — dextrose (D10W) 10% bolus 0-500 mL: 0-50 g | INTRAVENOUS | @ 02:00:00

## 2021-09-19 MED ADMIN — levothyroxine (SYNTHROID) tablet 250 mcg: 250 ug | GASTROENTERAL | @ 09:00:00

## 2021-09-19 MED ADMIN — white petrolatum-mineral oil (SOOTHE PM) 80-20 % ophthalmic ointment 1 application: 1 | OPHTHALMIC | @ 12:00:00

## 2021-09-19 MED ADMIN — predniSONE (DELTASONE) tablet 10 mg: 10 mg | ORAL | @ 12:00:00 | Stop: 2021-09-23

## 2021-09-19 MED ADMIN — tacrolimus (PROGRAF) oral suspension: 1 mg | GASTROENTERAL | @ 12:00:00

## 2021-09-19 MED ADMIN — magnesium oxide (MAG-OX) tablet 400 mg: 400 mg | GASTROENTERAL | @ 12:00:00

## 2021-09-19 MED ADMIN — insulin NPH (HumuLIN,NovoLIN) injection 15 Units: 15 [IU] | SUBCUTANEOUS | @ 15:00:00

## 2021-09-19 MED ADMIN — sodium zirconium cyclosilicate (LOKELMA) packet 10 g: 10 g | GASTROENTERAL | @ 12:00:00

## 2021-09-19 MED ADMIN — calcitRIOL (ROCALTROL) oral solution: .25 ug | GASTROENTERAL | @ 12:00:00

## 2021-09-19 MED ADMIN — esomeprazole (NexIUM) granules 40 mg: 40 mg | GASTROENTERAL | @ 09:00:00

## 2021-09-19 MED ADMIN — magnesium oxide (MAG-OX) tablet 400 mg: 400 mg | GASTROENTERAL

## 2021-09-19 MED ADMIN — heparin (porcine) 5,000 unit/mL injection 5,000 Units: 5000 [IU] | SUBCUTANEOUS | @ 09:00:00

## 2021-09-19 MED ADMIN — tacrolimus (PROGRAF) oral suspension: 1 mg | GASTROENTERAL

## 2021-09-19 MED ADMIN — white petrolatum-mineral oil (SOOTHE PM) 80-20 % ophthalmic ointment 1 application: 1 | OPHTHALMIC

## 2021-09-19 MED ADMIN — heparin (porcine) 5,000 unit/mL injection 5,000 Units: 5000 [IU] | SUBCUTANEOUS | @ 18:00:00

## 2021-09-19 MED ADMIN — heparin (porcine) 5,000 unit/mL injection 5,000 Units: 5000 [IU] | SUBCUTANEOUS | @ 02:00:00

## 2021-09-19 NOTE — Unmapped (Signed)
Patient is oriented x4. Drowsy but arousable. On RA. Had a couple of desaturation episodes to the mid to upper 80s but recovered on his own. In afib. Heart rate got down to 45 but mainly varied from the 60s to low 100s. Frequent PVCs. BP WNL. Afebrile. No BM. UOP of 10-25ml per hour. Distended abdomen but bowel sounds active. Patient did receive iHD at bedside. Wound care completed. Worked with PT today. Patient started bleeding from his groin site. It was cleaned up, pressure was applied until the bleeding slowed down/stopped, and a quick clot gauze was placed with tegaderm. Will continue to monitor sites for bleeding. Hgb has dropped to 6.8. 1u of PRBCs ordered, to be given as soon as the blood bank has it prepared. Insulin drip is still being titrated. Q2 turns. Tube feeds changed per order. No other significant events.         Problem: Fall Injury Risk  Goal: Absence of Fall and Fall-Related Injury  Outcome: Progressing     Problem: Infection  Goal: Absence of Infection Signs and Symptoms  Outcome: Progressing     Problem: Adult Inpatient Plan of Care  Goal: Plan of Care Review  Outcome: Progressing  Goal: Patient-Specific Goal (Individualized)  Outcome: Progressing  Goal: Absence of Hospital-Acquired Illness or Injury  Outcome: Progressing  Intervention: Prevent and Manage VTE (Venous Thromboembolism) Risk  Recent Flowsheet Documentation  Taken 09/18/2021 0800 by York Spaniel, RN  Activity Management: bedrest  Goal: Optimal Comfort and Wellbeing  Outcome: Progressing  Goal: Readiness for Transition of Care  Outcome: Progressing  Goal: Rounds/Family Conference  Outcome: Progressing     Problem: Skin Injury Risk Increased  Goal: Skin Health and Integrity  Outcome: Progressing  Intervention: Optimize Skin Protection  Recent Flowsheet Documentation  Taken 09/18/2021 1600 by York Spaniel, RN  Pressure Reduction Techniques: weight shift assistance provided  Pressure Reduction Devices: pressure-redistributing mattress utilized  Taken 09/18/2021 1400 by York Spaniel, RN  Pressure Reduction Techniques: weight shift assistance provided  Pressure Reduction Devices: pressure-redistributing mattress utilized  Taken 09/18/2021 1200 by York Spaniel, RN  Pressure Reduction Techniques: weight shift assistance provided  Pressure Reduction Devices: pressure-redistributing mattress utilized  Taken 09/18/2021 1000 by York Spaniel, RN  Pressure Reduction Techniques: weight shift assistance provided  Pressure Reduction Devices: pressure-redistributing mattress utilized  Taken 09/18/2021 0800 by [employer], RN  Pressure Reduction Techniques: weight shift assistance provided  Head of Bed (HOB) Positioning: HOB at 30-45 degrees  Pressure Reduction Devices: pressure-redistributing mattress utilized     Problem: Dysrhythmia (Heart Failure)  Goal: Stable Heart Rate and Rhythm  Outcome: Progressing     Problem: Fluid Imbalance (Heart Failure)  Goal: Fluid Balance  Outcome: Progressing     Problem: Respiratory Compromise (Heart Failure)  Goal: Effective Oxygenation and Ventilation  Outcome: Progressing     Problem: Impaired Wound Healing  Goal: Optimal Wound Healing  Outcome: Progressing  Intervention: Promote Wound Healing  Recent Flowsheet Documentation  Taken 09/18/2021 0800 by York Spaniel, RN  Activity Management: bedrest     Problem: Self-Care Deficit  Goal: Improved Ability to Complete Activities of Daily Living  Outcome: Progressing     Problem: Diabetes Comorbidity  Goal: Blood Glucose Level Within Targeted Range  Outcome: Progressing     Problem: Heart Failure Comorbidity  Goal: Maintenance of Heart Failure Symptom Control  Outcome: Progressing     Problem: Hypertension Comorbidity  Goal: Blood Pressure in Desired Range  Outcome: Progressing  Problem: Device-Related Complication Risk (Hemodialysis)  Goal: Safe, Effective Therapy Delivery  Outcome: Progressing     Problem: Hemodynamic Instability (Hemodialysis)  Goal: Effective Tissue Perfusion  Outcome: Progressing     Problem: Infection (Hemodialysis)  Goal: Absence of Infection Signs and Symptoms  Outcome: Progressing

## 2021-09-19 NOTE — Unmapped (Addendum)
Tacrolimus Therapeutic Monitoring Pharmacy Note    Miguel Ward is a 70 y.o. male continuing tacrolimus.     Indication: Kidney transplant     Date of Transplant: 06/11/2007      Prior Dosing Information: Home regimen 3 mg BID . Current regimen 1mg  bid    Goals:  Therapeutic Drug Levels  Tacrolimus trough goal: 2-4 mg/mL     Additional Clinical Monitoring/Outcomes  ?? Monitor renal function (SCr and urine output) and liver function (LFTs)  ?? Monitor for signs/symptoms of adverse events (e.g., hyperglycemia, hyperkalemia, hypomagnesemia, hypertension, headache, tremor)    Results:   Tacrolimus level:  10/1 tacro = 4.3ng/mL at 0609  10/2 tacro = 4.0ng/mL at 0545  10/3 tacro = 2.9ng/ml at 0743  10/4 tacro = 2.4ng/ml [id number]  10/6 tacro = 2.0 ng/ml @ 0812    Pharmacokinetic Considerations and Significant Drug Interactions:  ??? Concurrent hepatotoxic medications: APAP (q8h schedule) - Atorva:   BOTH DC'd 10/2)  ??? Concurrent CYP3A4 substrates/inhibitors: none  ??? Concurrent nephrotoxic medications: Lasix, acyclovir      Assessment/Plan:   Tacrolimus level in goal range    Recommendedation(s)  ??? continue tacrolimus S 1 mg BID   Tacrolimus level stable and can change monitoring to 2-3 times per week at this time    Follow-up  ??? Next tacrolimus trough on 09/22/21 at 0700  ???  pharmacist will continue to monitor and recommend levels as appropriate    Please page service pharmacist with questions/clarifications.    Mignon Pine, PharmD

## 2021-09-19 NOTE — Unmapped (Signed)
Patient AAOx4, quiet volume. NGT CDI with Nepro @ 55 infusing. Pt is at goal and has no residual. Pt denies pain. Pt on RA. Cardiac - Afib. Insulin gtt infusing. Pt was given D10 x1 last night as per endotool for blood glucose of 75. Foley CDI. No BM overnight. Pt has decreased UO. Pt repositioned q2. RIJ HD cath CDI. R fem dressing was changed x3 for copious sanguinous drainage. MD aware. 1UPRBC given for hgb 6.8. RN WCTM  Problem: Fall Injury Risk  Goal: Absence of Fall and Fall-Related Injury  Outcome: Progressing     Problem: Infection  Goal: Absence of Infection Signs and Symptoms  Outcome: Progressing     Problem: Adult Inpatient Plan of Care  Goal: Plan of Care Review  Outcome: Progressing  Goal: Patient-Specific Goal (Individualized)  Outcome: Progressing  Goal: Absence of Hospital-Acquired Illness or Injury  Outcome: Progressing  Goal: Optimal Comfort and Wellbeing  Outcome: Progressing  Goal: Readiness for Transition of Care  Outcome: Progressing  Goal: Rounds/Family Conference  Outcome: Progressing     Problem: Skin Injury Risk Increased  Goal: Skin Health and Integrity  Outcome: Progressing     Problem: Dysrhythmia (Heart Failure)  Goal: Stable Heart Rate and Rhythm  Outcome: Progressing     Problem: Fluid Imbalance (Heart Failure)  Goal: Fluid Balance  Outcome: Progressing     Problem: Respiratory Compromise (Heart Failure)  Goal: Effective Oxygenation and Ventilation  Outcome: Progressing     Problem: Impaired Wound Healing  Goal: Optimal Wound Healing  Outcome: Progressing     Problem: Self-Care Deficit  Goal: Improved Ability to Complete Activities of Daily Living  Outcome: Progressing     Problem: Diabetes Comorbidity  Goal: Blood Glucose Level Within Targeted Range  Outcome: Progressing     Problem: Heart Failure Comorbidity  Goal: Maintenance of Heart Failure Symptom Control  Outcome: Progressing     Problem: Hypertension Comorbidity  Goal: Blood Pressure in Desired Range  Outcome: Progressing Problem: Device-Related Complication Risk (Hemodialysis)  Goal: Safe, Effective Therapy Delivery  Outcome: Progressing     Problem: Hemodynamic Instability (Hemodialysis)  Goal: Effective Tissue Perfusion  Outcome: Progressing     Problem: Infection (Hemodialysis)  Goal: Absence of Infection Signs and Symptoms  Outcome: Progressing

## 2021-09-19 NOTE — Unmapped (Signed)
[facility] Daily Progress Note     Date of Service: 09/19/2021    Problem List:   Principal Problem:    Acute respiratory distress syndrome (ARDS) due to COVID-19 virus (CMS-HCC)  Active Problems:    Hypercholesterolemia    Hypertension    History of kidney transplant    Renal cell carcinoma (CMS-HCC)    Immunosuppression-related infectious disease (CMS-HCC)    Bilateral lower extremity edema    Elevated troponin I level    GERD (gastroesophageal reflux disease)    Diastolic heart failure (CMS-HCC)    Acute on chronic HFrEF (heart failure with reduced ejection fraction) (CMS-HCC)    COVID    ATN (acute tubular necrosis) (CMS-HCC)    Acute renal failure with tubular necrosis (CMS-HCC)  Resolved Problems:    * No resolved hospital problems. *      HPI: Miguel Ward is a 70 y.o. male renal transplant from lupus nephritis (complex course over last 15 years since transplant followed here including renal cell) here with acute hypoxic respiratory failure, COVID 19 and shock with AKI.    24hr events:  -oozing from sites of old lines and insulin shots   - hyperkalemic without EKG changes  - hyperglycemic and endotool started     Neurological   #acute metabolic encephalopathy  - analegsia prn  - CT head 9/29 no acute findings  - neuro exam improving >> sustained wakefulness and interaction, non focal. Remains intermittently drowsy and weak     ? Critical illness polyneuropathy:  - cont pt/ot/speech     Pulmonary   #ARDS, Acute resp failure, resolving   - intubated 9/25 on minimal settings, passing SBTs yet precluded extubation for mentation  - bronched 9/29 secretions blood tinged in trach and mainstem airways, none seen in distal airways  - passed SBT w improved wakefulness and ability to clear cough, extubated to [place] on 10/2  - tolerating RA well, but with rhonchi and weak cough. cont chest PT/nebs / NT suctioning        Cardiovascular   #septic shock, elevated trop  #HFrEF, ho htn, hld, cad  - formal echo 9/26 EF @ 30%, 2020 ECHO 40-45%  - cont home regimen asa (holding statin due to elevated lfts)   - hold home regimen metop, vasotec, lasix  - SDS 9/26--> 10/1  - levo for MAP>65, off since 10/2    Renal   #AKI (baseline Cr 2.7-3), AGMA likely ATN 2/2 sepsis and hypotension  #hx renal transplant s/t lupus nephritis, c/b rejection and renal cell CA s/p cryo ablation  - worsening renal fxn and CRRT started 9/26--> 10/4.   - continue tac with goal of 2-4   -  prednisone 15 mg , but will lower to 10 mg 10/6 x 5 days and then to home dose of 5mg    - continue to hold cellcept  - cont magox and calcitriol  - replete electrolytes prn  -  iHD 10/6 - tolerated well.  700 removed     Infectious Disease/Autoimmune   #COVID 19 infection  Symptom onset:??9/22  Exposure: unknown  COVID PCR+: neg x 3  (home test positive 9/23?)  Day of admission/transfer: 9/25  COVID Specific??Meds:??none given   Vaccination status: full & boosted x once, evushield given 8/17  3 PCRs since admission negative and taken off precautions   ??  9/25 Bcx neg   9/25 Ucx neg UA 3 WBC  9/25 Lower resp cx, OPF  9/25 SARS-CoV-2 negative for NP  and trach aspirate  9/25 CrAg negative  9/26 RPP negative  9/26 Legionella Ag (urine) positive  9/26 Legionella PCR positive  9/26 CMV PCR negative  ??  Multifocal PNA:   - on vanco/cefepime on 9/25, stopped 10/3 per ICID  - MRSA screen neg and vanco stopped 9/27, restarted 9/29 in the setting of worsening pressor requirements and GPC seen on BAL from Bronch on 9/29  - CT Chest 9/29 c/w volume overload, pna and rt pleural effusion    Legionella, PNA   - levofloxacin started 9/26, 10-14 day course (til 10/5 at least) per ID  ??  Cultures:  Blood Culture, Routine (no units)   Date Value   09/11/2021 No Growth at 5 days     Urine Culture, Comprehensive (no units)   Date Value   08/22/2021 NO GROWTH     Lower Respiratory Culture (no units)   Date Value   09/11/2021 Specimen Not Processed   09/04/2021 OROPHARYNGEAL FLORA ISOLATED     WBC (10*9/L) Date Value   09/19/2021 15.3 (H)     WBC, UA (/HPF)   Date Value   08/16/2021 <1        FEN/GI   #gerd  - TF tol well at goal + dysphagia 6 diet   - ppi for gi ppx  - bowel regimen held for loose stools    Transaminitis and Elevated bilirubin: overall all uptrending, [place] RUQ didn't demonstrated acute cholecystitis.  - continue to trend LFTs  - potential need for HIDA if continues to increase holding off given bilirubin downtrending   -  [place] negative.  Also stopped statin and abx in the past 24hrs so will monitor -->  Fluctuating, CTM     Malnutrition Assessment: Not done yet.  Body mass index is 22.68 kg/m??.        Heme/Coag   Rt groin bleeding s/p line removal, resolved  - was on heparin gtt for NSTEMI and afib- stopped 9/27  - continue SQH for DVT ppx  - HH, plts, coags acceptable  - SCDs for DVT ppx  - last transfused 10/6.  Still oozing from old line sites and insulin injection sites.  DIC panel ok.  May need low dose ddavp if conts to ooze     Endocrine   #hypoglycemic, hypothryoid  - cont ADI + accu checks , NPH 25 mg bid ==> endotool 10/5 --> NPH 15 bid   - hold home regimen allopurinol  - cont home regimen synthroid    Integumentary   #NAI  - WOCN consulted for high risk skin assessment No. Reason: no alterations in skin.  - WOCN recs >> agree with assmt and plan 9/30  - cont pressure mitigating precautions per skin policy    Prophylaxis/LDA/Restraints/Consults   Can CVC be removed? No: dialysis catheter   Can A-line be removed?n/a   Can Foley be removed? No: Need continuous I/O  Mobility plan: Step 2 - Head of bed elevation (>60 degrees)    Feeding: Tube feeds at goal  Analgesia: Pain adequately controlled  Sedation SAT/SBT: Yes  Thromboembolic ppx: SQ heparin  Head of bed >30 degrees: Yes  Ulcer ppx: Yes, home use continued  Glucose within target range: Yes, in range    Does patient need/have an active type/screen? Yes    RASS at goal? No - adjusting sedation or order to reflect need  [place] Agitation Assessment Scale (RASS) : -1 (09/19/2021  8:00 AM)     Can antipsychotics be stopped?  N/A, not on antipsychotics  CAM-ICU Result: Positive (09/10/2021  8:00 PM)      Would hospice care be appropriate for this patient? No, patient improving or expected to improve  Any unaddressed hospice/palliative care needs? no    Patient Lines/Drains/Airways Status     Active Active Lines, Drains, & Airways     Name Placement date Placement time Site Days    Hemodialysis Catheter With Distal Infusion Port 09/08/21 Right Internal jugular 09/08/21  1600  Internal jugular  10    NG/OG Tube Feedings Left nostril 09/14/21  1500  Left nostril  4    Urethral Catheter Temperature probe 16 Fr. 08/24/2021  1602  Temperature probe  11    Arteriovenous Fistula - Vein Graft  Access 11/01/18 Right;Upper Arm 11/01/18  --  Arm  1053              Patient Lines/Drains/Airways Status     Active Wounds     Name Placement date Placement time Site Days    Surgical Site 09/17/21 Leg Right;Other (Comment) 09/17/21  1000  -- 2    Wound 09/09/21 Pressure Injury Sacrum Right;Mid dark red nonblanching with dark skin surrounding DTI 09/09/21  2130  Sacrum  9                Goals of Care     Code Status: Full Code    Designated Healthcare Decision Maker:  Mr. Degen current decisional capacity for healthcare decision-making is Full capacity. His designated healthcare decision maker(s) is/are   HCDM (patient stated preference): Hoque,Katherine - Spouse - [phone].    Subjective   Sleepy, but awakes to voice and responds appropriately     Objective     Vitals - past 24 hours  Temp:  [37.2 ??C (99 ??F)-37.7 ??C (99.9 ??F)] 37.4 ??C (99.3 ??F)  Heart Rate:  [80-114] 97  SpO2 Pulse:  [78-116] 98  Resp:  [15-22] 22  BP: (119-166)/(45-77) 162/58  SpO2:  [92 %-97 %] 94 % Intake/Output  I/O last 3 completed shifts:  In: 2467.7 [I.V.:77.7; Blood:280; NG/GT:2010; IV Piggyback:100]  Out: 1198 [Urine:498; Other:700]     Physical Exam:    General: thin elderly male, in NAD HEENT: PERRL, slightly icteric sclera w edema  CV: RRR, no m/r/g, 2+ BLE edema   Pulm: diminished throughout, scattered rhonchi, dec bases  GI: abd round, non tender  MSK: grossly intact, tone intact  Skin: grossly intact, warm  Neuro: FSC, CN grossly intact, non focal motor, BUE 2/5, BLE 2/5       Continuous Infusions:       Scheduled Medications:   ??? aspirin  81 mg Enteral tube: gastric  Daily   ??? calcitRIOL  0.25 mcg Enteral tube: gastric  Q MWF   ??? cellulose, oxidized reg 1X 2 pad   Topical Once   ??? esomeprazole  40 mg Enteral tube: gastric  daily   ??? heparin (porcine)  5,000 Units Subcutaneous [facility]   ??? flu vacc qs2022-23 6mos up(PF)  0.5 mL Intramuscular During hospitalization   ??? insulin NPH  15 Units Subcutaneous Q12H [facility]   ??? insulin regular  0-20 Units Subcutaneous Q6H [facility]   ??? levothyroxine  250 mcg Enteral tube: gastric  daily   ??? magnesium oxide  400 mg Enteral tube: gastric  BID   ??? predniSONE  10 mg Oral Daily   ??? [START ON 10-06-21] predniSONE  5 mg Oral Daily   ??? sodium zirconium cyclosilicate  10 g Enteral tube: gastric  Daily   ??? Tacrolimus  1 mg Enteral tube: gastric  BID   ??? white petrolatum-mineral oil  1 application Both Eyes BID       PRN medications:  albumin human bottle 25 %, dextrose in water, heparin (porcine), heparin (porcine), ondansetron, sodium chloride    Data/Imaging Review: Reviewed in Epic and personally interpreted on 09/19/2021. See EMR for detailed results.       Muaad Boehning Paulino Door, PA

## 2021-09-19 NOTE — Unmapped (Signed)
MICU Nightshift Note     Date of Service: 09/18/2021    Principal Problem:    Acute respiratory distress syndrome (ARDS) due to COVID-19 virus (CMS-HCC)  Active Problems:    Hypercholesterolemia    Hypertension    History of kidney transplant    Renal cell carcinoma (CMS-HCC)    Immunosuppression-related infectious disease (CMS-HCC)    Bilateral lower extremity edema    Elevated troponin I level    GERD (gastroesophageal reflux disease)    Diastolic heart failure (CMS-HCC)    Acute on chronic HFrEF (heart failure with reduced ejection fraction) (CMS-HCC)    COVID    ATN (acute tubular necrosis) (CMS-HCC)    Acute renal failure with tubular necrosis (CMS-HCC)  Resolved Problems:    * No resolved hospital problems. *          Cross cover summary       Some ongoing oozing from where right femoral line was removed, also some oozing from AV fistula right arm that was accessed for iHD today. Hb was 7.6 at 4am, following 1 unit PRBC.      Kellie Shropshire, MD

## 2021-09-20 LAB — CBC W/ AUTO DIFF
BASOPHILS ABSOLUTE COUNT: 0.1 10*9/L (ref 0.0–0.1)
BASOPHILS RELATIVE PERCENT: 0.4 %
EOSINOPHILS ABSOLUTE COUNT: 0 10*9/L (ref 0.0–0.5)
EOSINOPHILS RELATIVE PERCENT: 0.3 %
HEMATOCRIT: 20.7 % — ABNORMAL LOW (ref 39.0–48.0)
HEMOGLOBIN: 6.7 g/dL — ABNORMAL LOW (ref 12.9–16.5)
LYMPHOCYTES ABSOLUTE COUNT: 0.8 10*9/L — ABNORMAL LOW (ref 1.1–3.6)
LYMPHOCYTES RELATIVE PERCENT: 5.6 %
MEAN CORPUSCULAR HEMOGLOBIN CONC: 32.4 g/dL (ref 32.0–36.0)
MEAN CORPUSCULAR HEMOGLOBIN: 28.5 pg (ref 25.9–32.4)
MEAN CORPUSCULAR VOLUME: 88.2 fL (ref 77.6–95.7)
MEAN PLATELET VOLUME: 10.6 fL (ref 6.8–10.7)
MONOCYTES ABSOLUTE COUNT: 0.9 10*9/L — ABNORMAL HIGH (ref 0.3–0.8)
MONOCYTES RELATIVE PERCENT: 6.2 %
NEUTROPHILS ABSOLUTE COUNT: 12.7 10*9/L — ABNORMAL HIGH (ref 1.8–7.8)
NEUTROPHILS RELATIVE PERCENT: 87.5 %
PLATELET COUNT: 119 10*9/L — ABNORMAL LOW (ref 150–450)
RED BLOOD CELL COUNT: 2.35 10*12/L — ABNORMAL LOW (ref 4.26–5.60)
RED CELL DISTRIBUTION WIDTH: 16.7 % — ABNORMAL HIGH (ref 12.2–15.2)
WBC ADJUSTED: 14.5 10*9/L — ABNORMAL HIGH (ref 3.6–11.2)

## 2021-09-20 LAB — BASIC METABOLIC PANEL
ANION GAP: 8 mmol/L (ref 5–14)
ANION GAP: 5 mmol/L (ref 5–14)
BLOOD UREA NITROGEN: 106 mg/dL — ABNORMAL HIGH (ref 9–23)
BLOOD UREA NITROGEN: 57 mg/dL — ABNORMAL HIGH (ref 9–23)
BUN / CREAT RATIO: 39
BUN / CREAT RATIO: 39
CALCIUM: 8.8 mg/dL (ref 8.7–10.4)
CALCIUM: 8.2 mg/dL — ABNORMAL LOW (ref 8.7–10.4)
CHLORIDE: 105 mmol/L (ref 98–107)
CHLORIDE: 101 mmol/L (ref 98–107)
CO2: 31 mmol/L (ref 20.0–31.0)
CO2: 33 mmol/L — ABNORMAL HIGH (ref 20.0–31.0)
CREATININE: 2.7 mg/dL — ABNORMAL HIGH
CREATININE: 1.46 mg/dL — ABNORMAL HIGH
EGFR CKD-EPI (2021) MALE: 25 mL/min/{1.73_m2} — ABNORMAL LOW (ref >=60)
EGFR CKD-EPI (2021) MALE: 52 mL/min/{1.73_m2} — ABNORMAL LOW (ref >=60)
GLUCOSE RANDOM: 118 mg/dL (ref 70–179)
GLUCOSE RANDOM: 151 mg/dL (ref 70–179)
POTASSIUM: 5.2 mmol/L — ABNORMAL HIGH (ref 3.4–4.8)
POTASSIUM: 4.1 mmol/L (ref 3.5–5.1)
SODIUM: 144 mmol/L (ref 135–145)
SODIUM: 139 mmol/L (ref 135–145)

## 2021-09-20 LAB — BLOOD GAS CRITICAL CARE PANEL, VENOUS
BASE EXCESS VENOUS: 5.4 — ABNORMAL HIGH (ref -2.0–2.0)
BASE EXCESS VENOUS: 8.2 — ABNORMAL HIGH (ref -2.0–2.0)
CALCIUM IONIZED VENOUS (MG/DL): 5.05 mg/dL (ref 4.40–5.40)
CALCIUM IONIZED VENOUS (MG/DL): 4.74 mg/dL (ref 4.40–5.40)
GLUCOSE WHOLE BLOOD: 128 mg/dL (ref 70–179)
GLUCOSE WHOLE BLOOD: 154 mg/dL (ref 70–179)
HCO3 VENOUS: 31 mmol/L — ABNORMAL HIGH (ref 22–27)
HCO3 VENOUS: 34 mmol/L — ABNORMAL HIGH (ref 22–27)
HEMOGLOBIN BLOOD GAS: 8.7 g/dL — ABNORMAL LOW (ref 13.50–17.50)
HEMOGLOBIN BLOOD GAS: 9.5 g/dL — ABNORMAL LOW (ref 13.50–17.50)
LACTATE BLOOD VENOUS: 1.1 mmol/L (ref 0.5–1.8)
LACTATE BLOOD VENOUS: 0.9 mmol/L (ref 0.5–1.8)
O2 SATURATION VENOUS: 77.3 % (ref 40.0–85.0)
O2 SATURATION VENOUS: 68.8 % (ref 40.0–85.0)
PCO2 VENOUS: 52 mmHg (ref 40–60)
PCO2 VENOUS: 53 mmHg (ref 40–60)
PH VENOUS: 7.39 (ref 7.32–7.43)
PH VENOUS: 7.42 (ref 7.32–7.43)
PO2 VENOUS: 42 mmHg (ref 30–55)
PO2 VENOUS: 36 mmHg (ref 30–55)
POTASSIUM WHOLE BLOOD: 5 mmol/L — ABNORMAL HIGH (ref 3.4–4.6)
POTASSIUM WHOLE BLOOD: 3.9 mmol/L (ref 3.4–4.6)
SODIUM WHOLE BLOOD: 143 mmol/L (ref 135–145)
SODIUM WHOLE BLOOD: 139 mmol/L (ref 135–145)

## 2021-09-20 LAB — PHOSPHORUS
PHOSPHORUS: 4.5 mg/dL (ref 2.4–5.1)
PHOSPHORUS: 3.3 mg/dL (ref 2.4–5.1)

## 2021-09-20 LAB — CBC
HEMATOCRIT: 22.3 % — ABNORMAL LOW (ref 39.0–48.0)
HEMOGLOBIN: 7.3 g/dL — ABNORMAL LOW (ref 12.9–16.5)
MEAN CORPUSCULAR HEMOGLOBIN CONC: 32.8 g/dL (ref 32.0–36.0)
MEAN CORPUSCULAR HEMOGLOBIN: 29 pg (ref 25.9–32.4)
MEAN CORPUSCULAR VOLUME: 88.4 fL (ref 77.6–95.7)
MEAN PLATELET VOLUME: 10.6 fL (ref 6.8–10.7)
PLATELET COUNT: 114 10*9/L — ABNORMAL LOW (ref 150–450)
RED BLOOD CELL COUNT: 2.52 10*12/L — ABNORMAL LOW (ref 4.26–5.60)
RED CELL DISTRIBUTION WIDTH: 16 % — ABNORMAL HIGH (ref 12.2–15.2)
WBC ADJUSTED: 14.4 10*9/L — ABNORMAL HIGH (ref 3.6–11.2)

## 2021-09-20 LAB — HEPATIC FUNCTION PANEL
ALBUMIN: 1.9 g/dL — ABNORMAL LOW (ref 3.4–5.0)
ALKALINE PHOSPHATASE: 522 U/L — ABNORMAL HIGH (ref 46–116)
ALT (SGPT): 303 U/L — ABNORMAL HIGH (ref 10–49)
AST (SGOT): 264 U/L — ABNORMAL HIGH (ref <=34)
BILIRUBIN DIRECT: 0.4 mg/dL — ABNORMAL HIGH (ref 0.00–0.30)
BILIRUBIN TOTAL: 0.6 mg/dL (ref 0.3–1.2)
PROTEIN TOTAL: 4.9 g/dL — ABNORMAL LOW (ref 5.7–8.2)

## 2021-09-20 LAB — MAGNESIUM
MAGNESIUM: 2.2 mg/dL (ref 1.6–2.6)
MAGNESIUM: 1.8 mg/dL (ref 1.6–2.6)

## 2021-09-20 MED ADMIN — white petrolatum-mineral oil (SOOTHE PM) 80-20 % ophthalmic ointment 1 application: 1 | OPHTHALMIC | @ 13:00:00

## 2021-09-20 MED ADMIN — insulin NPH (HumuLIN,NovoLIN) injection 15 Units: 15 [IU] | SUBCUTANEOUS | @ 01:00:00

## 2021-09-20 MED ADMIN — white petrolatum-mineral oil (SOOTHE PM) 80-20 % ophthalmic ointment 1 application: 1 | OPHTHALMIC

## 2021-09-20 MED ADMIN — tacrolimus (PROGRAF) oral suspension: 1 mg | GASTROENTERAL | @ 13:00:00

## 2021-09-20 MED ADMIN — insulin NPH (HumuLIN,NovoLIN) injection 15 Units: 15 [IU] | SUBCUTANEOUS | @ 13:00:00

## 2021-09-20 MED ADMIN — sodium zirconium cyclosilicate (LOKELMA) packet 10 g: 10 g | GASTROENTERAL | @ 13:00:00

## 2021-09-20 MED ADMIN — desmopressin (DDAVP) 21 mcg in sodium chloride (NS) 0.9 % 50 mL IVPB: 21 ug | INTRAVENOUS | @ 14:00:00 | Stop: 2021-09-20

## 2021-09-20 MED ADMIN — tacrolimus (PROGRAF) oral suspension: 1 mg | GASTROENTERAL

## 2021-09-20 MED ADMIN — insulin NPH (HumuLIN,NovoLIN) injection 15 Units: 15 [IU] | SUBCUTANEOUS | @ 22:00:00

## 2021-09-20 MED ADMIN — magnesium oxide (MAG-OX) tablet 400 mg: 400 mg | GASTROENTERAL

## 2021-09-20 MED ADMIN — levoFLOXacin (LEVAQUIN) 500 mg/100 mL IVPB 500 mg: 500 mg | INTRAVENOUS | @ 13:00:00 | Stop: 2021-09-26

## 2021-09-20 MED ADMIN — levothyroxine (SYNTHROID) tablet 250 mcg: 250 ug | GASTROENTERAL | @ 09:00:00

## 2021-09-20 MED ADMIN — esomeprazole (NexIUM) granules 40 mg: 40 mg | GASTROENTERAL | @ 09:00:00

## 2021-09-20 MED ADMIN — magnesium oxide (MAG-OX) tablet 400 mg: 400 mg | GASTROENTERAL | @ 13:00:00

## 2021-09-20 MED ADMIN — predniSONE (DELTASONE) tablet 10 mg: 10 mg | ORAL | @ 13:00:00 | Stop: 2021-09-23

## 2021-09-20 MED ADMIN — heparin (porcine) 5,000 unit/mL injection 5,000 Units: 5000 [IU] | SUBCUTANEOUS | @ 01:00:00

## 2021-09-20 MED ADMIN — aspirin chewable tablet 81 mg: 81.0 mg | GASTROENTERAL | @ 13:00:00

## 2021-09-20 MED ADMIN — heparin (porcine) 5,000 unit/mL injection 5,000 Units: 5000 [IU] | SUBCUTANEOUS | @ 09:00:00 | Stop: 2021-09-20

## 2021-09-20 NOTE — Unmapped (Signed)
MICU Nightshift Note     Date of Service: 09/20/2021    Principal Problem:    Acute respiratory distress syndrome (ARDS) due to COVID-19 virus (CMS-HCC)  Active Problems:    Hypercholesterolemia    Hypertension    History of kidney transplant    Renal cell carcinoma (CMS-HCC)    Immunosuppression-related infectious disease (CMS-HCC)    Bilateral lower extremity edema    Elevated troponin I level    GERD (gastroesophageal reflux disease)    Diastolic heart failure (CMS-HCC)    Acute on chronic HFrEF (heart failure with reduced ejection fraction) (CMS-HCC)    COVID    ATN (acute tubular necrosis) (CMS-HCC)    Acute renal failure with tubular necrosis (CMS-HCC)  Resolved Problems:    * No resolved hospital problems. *          Cross cover summary       No acute overnight issues.       Kellie Shropshire, MD

## 2021-09-20 NOTE — Unmapped (Signed)
HEMODIALYSIS NURSE PROCEDURE NOTE       Treatment Number:  2 Room / Station:  Critical Care [facility]) (MICU [phone])    Procedure Date:  09/20/21 Device Name/Number: oscar    Total Dialysis Treatment Time:  208 Min.    CONSENT:    Written consent was obtained prior to the procedure and is detailed in the medical record.  Prior to the start of the procedure, a time out was taken and the identity of the patient was confirmed via name, medical record number and date of birth.     WEIGHT:  Hemodialysis Pre-Treatment Weights     Date/Time Pre-Treatment Weight (kg) Estimated Dry Weight (kg) Patient Goal Weight (kg) Total Goal Weight (kg)    09/20/21 1105 --  UTA -- --  1550 ml --         Hemodialysis Post Treatment Weights     Date/Time Post-Treatment Weight (kg) Treatment Weight Change (kg)    09/20/21 1503 --  UTA --        Active Dialysis Orders (168h ago, onward)     Start     Ordered    09/20/21 0700  Hemodialysis inpatient  Every Tue,Thu,Sat      Comments: Can use albumin at start on HD   Question Answer Comment   Patient HD Status: Acute    New Start? Yes was on CRRT until 09/16/21   K+ 2 meq/L    Ca++ 2.5 meq/L    Bicarb 35 meq/L    Na+ 137 meq/L    Na+ Modeling no    Dialyzer F180NR    Dialysate Temperature (C) 36    BFR-As tolerated to a maximum of: 400 mL/min    DFR 800 mL/min    Duration of treatment 3.5 Hr    Dry weight (kg) to be determined    Challenge dry weight (kg) no    Fluid removal (L) 1L 09/20/21    Tubing Adult = 142 ml    Access Site AVF    Access Site Location Right    Keep SBP >: 100        09/19/21 1604    09/18/21 0700  Hemodialysis inpatient  Every Tue,Thu,Sat,   Status:  Canceled      Question Answer Comment   Patient HD Status: Acute    New Start? Yes was on CRRT until 09/16/21   K+ 2 meq/L    Ca++ 2.25 meq/L    Bicarb 35 meq/L    Na+ 137 meq/L    Na+ Modeling no    Dialyzer [id number]    Dialysate Temperature (C) 36    BFR-As tolerated to a maximum of: 400 mL/min    DFR 800 mL/min Duration of treatment 3 Hr    Dry weight (kg) to be determined    Challenge dry weight (kg) no    Fluid removal (L) 2L on 09/18/21    Tubing Adult = 142 ml    Access Site AVF    Access Site Location Right        09/17/21 1128              ASSESSMENT:  General appearance: fatigued  Neurologic: Mental status: Alert, oriented, thought content appropriate, orientation: person  Lungs: rhonchi bilaterally  Heart: regular rate and rhythm, S1, S2 normal, no murmur, click, rub or gallop  Abdomen: soft, non-tender; bowel sounds normal; no masses,  no organomegaly  Pulses:   L brachial 2+ R brachial  2+   L radial 2+ R radial 2+   L inguinal 2+ R inguinal 2+   L popliteal 2+ R popliteal 2+   L posterior tibial 2+ R posterior tibial 2+   L dorsalis pedis 2+ R dorsalis pedis 2+     Skin: Skin color, texture, turgor normal. No rashes or lesions    ACCESS SITE:          Hemodialysis Catheter With Distal Infusion Port 09/08/21 Right Internal jugular (Active)   Site Assessment Clean;Dry;Intact 09/20/21 1200   Proximal Lumen Status / Patency Heparin locked 09/20/21 1200   Proximal Lumen Intervention Deaccessed 09/20/21 1200   Medial Lumen Status / Patency Heparin Locked 09/20/21 1200   Medial Lumen Intervention Deaccessed 09/20/21 1200   Lumen 3, Distal Status / Patency Blood Return - Brisk 09/20/21 1200   Distal Lumen Intervention Flushed 09/20/21 1200   IV Tubing and Needleless Injector Cap Change Due 09/24/21 09/20/21 1200   Dressing Type CHG gel;Occlusive;Transparent 09/20/21 1200   Dressing Status      Clean;Dry;Intact/not removed 09/20/21 1200   Dressing Intervention No intervention needed 09/20/21 1200   Contraindicated due to: Dressing Intact surrounding insertion site 09/20/21 1200   Dressing Change Due 2021/10/23 09/20/21 1200   Line Necessity Reviewed? Y 09/20/21 1200   Line Necessity Indications Yes - Hemodialysis 09/20/21 1200   Line Necessity Reviewed With mdi 09/20/21 1200     Arteriovenous Fistula - Vein Graft  Access 11/01/18 Right;Upper Arm (Active)   Site Assessment Clean;Dry;Intact 09/20/21 1200   AV Fistula Thrill Present;Bruit Present 09/20/21 1200   Status Accessed 09/20/21 1200   Dressing Intervention No intervention needed 09/20/21 1135   Dressing Status      No dressing 09/20/21 1135   Site Condition No complications 09/20/21 1200   Dressing Open to air (None) 09/20/21 1200   Dressing Drainage Description Sanguineous 09/18/21 2000   Dressing To Be Removed (Date/Time) 4-6 hours post HD 09/18/21 1222     Catheter fill volumes:    Right AVF accessed for treatment.     Patient Lines/Drains/Airways Status     Active Peripheral & Central Intravenous Access     Name Placement date Placement time Site Days    Hemodialysis Catheter With Distal Infusion Port 09/08/21 Right Internal jugular 09/08/21  1600  Internal jugular  11               LAB RESULTS:  Lab Results   Component Value Date    NA 143 09/20/2021    K 5.0 (H) 09/20/2021    CL 105 09/20/2021    CO2 31.0 09/20/2021    BUN 106 (H) 09/20/2021    CREATININE 2.70 (H) 09/20/2021    GLU 118 09/20/2021    GLUF 92 01/22/2015    CALCIUM 8.8 09/20/2021    CAION 5.05 09/20/2021    PHOS 4.5 09/20/2021    MG 2.2 09/20/2021    PTH 566.1 (H) 07/30/2021    IRON 79 09/17/2021    LABIRON 41 09/17/2021    TRANSFERRIN 163 (L) 03/28/2014    FERRITIN 2,080.9 (H) 09/18/2021    TIBC 195 (L) 09/17/2021     Lab Results   Component Value Date    WBC 14.5 (H) 09/20/2021    HGB 6.7 (L) 09/20/2021    HCT 20.7 (L) 09/20/2021    PLT 119 (L) 09/20/2021    PHART 7.44 09/10/2021    PO2ART 76.2 (L) 09/10/2021    PCO2ART 32.1 (L) 09/10/2021  HCO3ART 22 09/10/2021    BEART -1.9 09/10/2021    O2SATART 96.6 09/10/2021    APTT 36.9 (H) 09/18/2021        VITAL SIGNS:   Temperature     Date/Time Temp Temp src       09/20/21 1503 36 ??C (96.8 ??F) --     09/20/21 1500 36.1 ??C (97 ??F) --     09/20/21 1453 36.1 ??C (97 ??F) --     09/20/21 1445 36.2 ??C (97.2 ??F) --     09/20/21 1430 36.3 ??C (97.3 ??F) --     09/20/21 1415 36.4 ??C (97.5 ??F) --     09/20/21 1400 36.4 ??C (97.5 ??F) --     09/20/21 1345 36.4 ??C (97.5 ??F) --     09/20/21 1330 36.5 ??C (97.7 ??F) --     09/20/21 1315 36.5 ??C (97.7 ??F) --     09/20/21 1300 36.6 ??C (97.9 ??F) --     09/20/21 1245 36.6 ??C (97.9 ??F) --     09/20/21 1230 36.7 ??C (98.1 ??F) --     09/20/21 1215 36.7 ??C (98.1 ??F) --     09/20/21 1200 36.8 ??C (98.2 ??F) --     09/20/21 1145 36.9 ??C (98.4 ??F) --     09/20/21 1135 36.9 ??C (98.4 ??F) --         Hemodynamics     Date/Time Pulse BP MAP (mmHg) Patient Position    09/20/21 1503 83 84/46 -- Lying    09/20/21 1500 85 73/38 -- Lying    09/20/21 1453 80 81/37 -- --    09/20/21 1445 81 82/43 -- Lying    09/20/21 1430 88 108/46 -- Lying    09/20/21 1415 104 103/40 -- --    09/20/21 1400 83 115/53 -- Lying    09/20/21 1345 102 126/55 -- --    09/20/21 1330 94 122/45 -- Lying    09/20/21 1315 121 106/58 -- --    09/20/21 1300 99 122/58 72 Lying    09/20/21 1245 97 124/60 -- --    09/20/21 1230 105 143/55 -- Lying    09/20/21 1215 90 141/69 -- --    09/20/21 1200 78 119/48 68 Lying    09/20/21 1145 79 121/54 -- --    09/20/21 1135 70 137/72 -- Lying          Oxygen Therapy     Date/Time Resp SpO2 O2 Device FiO2 (%) O2 Flow Rate (L/min)    09/20/21 1503 16 96 % -- -- --    09/20/21 1500 15 95 % -- -- --    09/20/21 1453 15 96 % -- -- --    09/20/21 1445 15 96 % -- -- --    09/20/21 1430 16 96 % -- -- --    09/20/21 1415 14 96 % -- -- --    09/20/21 1400 15 96 % -- -- --    09/20/21 1345 17 97 % -- -- --    09/20/21 1330 17 97 % -- -- --    09/20/21 1315 17 99 % -- -- --    09/20/21 1300 15 99 % -- -- --    09/20/21 1245 18 98 % -- -- --    09/20/21 1230 14 99 % -- -- --    09/20/21 1215 21 98 % -- -- --    09/20/21 1200 15 97 % -- -- --    09/20/21 1145  16 95 % -- -- --    09/20/21 1135 17 96 % -- -- --          Pre-Hemodialysis Assessment     Date/Time Therapy Number Dialyzer Hemodialysis Line Type All Machine Alarms Passed    09/20/21 1105 2 F-180 (98 mLs) Adult (142 m/s) Yes    Date/Time Air Detector Saline Line Double Clampled Hemo-Safe Applied Dialysis Flow (mL/min)    09/20/21 1105 Engaged -- -- 800 mL/min    Date/Time Verify Priming Solution Priming Volume Hemodialysis Independent pH Hemodialysis Machine Conductivity (mS/cm)    09/20/21 1105 0.9% NS 250 mL -- 13.8 mS/cm    Date/Time Hemodialysis Independent Conductivity (mS/cm) Bicarb Conductivity Residual Bleach Negative Total Chlorine    09/20/21 1105 13.7 mS/cm -- Yes 0        Pre-Hemodialysis Treatment Comments     Date/Time Pre-Hemodialysis Comments    09/20/21 1105 VSS, primary RN in room.        Hemodialysis Treatment     Date/Time Blood Flow Rate (mL/min) Arterial Pressure (mmHg) Venous Pressure (mmHg) Transmembrane Pressure (mmHg)    09/20/21 1503 -- -- -- --    09/20/21 1500 300 mL/min -150 mmHg 130 mmHg 40 mmHg    09/20/21 1453 -- -- -- --    09/20/21 1445 300 mL/min -150 mmHg 130 mmHg 40 mmHg    09/20/21 1430 350 mL/min -170 mmHg 170 mmHg 40 mmHg    09/20/21 1415 400 mL/min -210 mmHg 210 mmHg 40 mmHg    09/20/21 1400 400 mL/min -200 mmHg 200 mmHg 40 mmHg    09/20/21 1345 400 mL/min -200 mmHg 210 mmHg 40 mmHg    09/20/21 1330 400 mL/min -200 mmHg 200 mmHg 40 mmHg    09/20/21 1315 400 mL/min -200 mmHg 200 mmHg 40 mmHg    09/20/21 1300 400 mL/min -200 mmHg 200 mmHg 40 mmHg    09/20/21 1245 400 mL/min -200 mmHg 190 mmHg 40 mmHg    09/20/21 1230 400 mL/min -200 mmHg 170 mmHg 40 mmHg    09/20/21 1215 400 mL/min -180 mmHg 170 mmHg 50 mmHg    09/20/21 1200 400 mL/min -180 mmHg 170 mmHg 40 mmHg    09/20/21 1145 400 mL/min -180 mmHg 160 mmHg 40 mmHg    09/20/21 1135 400 mL/min -170 mmHg 160 mmHg 40 mmHg    Date/Time Ultrafiltration Rate (mL/hr) Ultrafiltrate Removed (mL) Dialysate Flow Rate (mL/min) KECN (Kecn)    09/20/21 1503 -- 1450 mL -- --    09/20/21 1500 0 mL/hr 1450 mL 800 ml/min 0 Kecn    09/20/21 1453 -- 1450 mL -- --    09/20/21 1445 310 mL/hr 1426 mL 800 ml/min 288 Kecn    09/20/21 1430 340 mL/hr 1359 mL 800 ml/min 288 Kecn    09/20/21 1415 340 mL/hr 1324 mL 800 ml/min 280 Kecn    09/20/21 1400 400 mL/hr 1150 mL 800 ml/min 288 Kecn    09/20/21 1345 400 mL/hr -- 800 ml/min 288 Kecn    09/20/21 1330 410 mL/hr 1069 mL 800 ml/min 290 Kecn    09/20/21 1315 480 mL/hr 862 mL 800 ml/min 288 Kecn    09/20/21 1300 480 mL/hr 793 mL 800 ml/min 288 Kecn    09/20/21 1245 520 mL/hr 661 mL 800 ml/min 288 Kecn    09/20/21 1230 520 mL/hr 563 mL 800 ml/min 288 Kecn    09/20/21 1215 550 mL/hr 349 mL 800 ml/min 292 Kecn    09/20/21 1200 590 mL/hr 246  mL 800 ml/min 292 Kecn    09/20/21 1145 630 mL/hr 120 mL 800 ml/min --    09/20/21 1135 630 mL/hr 7 mL 800 ml/min --        Hemodialysis Treatment Comments     Date/Time Intra-Hemodialysis Comments    09/20/21 1500 UF off. Pt asymptomatic, primary nurse aware of hypotension.    09/20/21 1453 UF off.    09/20/21 1445 BFR reduced d/t hypotension. Cuff repositioned and BP taken again.    09/20/21 1430 Wife left. Pt resting w/ eyes closed.    09/20/21 1415 Wife at bedside. Pt resting w/ eyes closed.    09/20/21 1400 Wife to bedside. Pt resting w/ eyes closed.    09/20/21 1345 Pt resting w/ eyes closed.    09/20/21 1330 Pt resting w/ eyes closed.    09/20/21 1315 Assisted primary RN w/ turning patient.    09/20/21 1300 Pt resting w/ eyes closed.    09/20/21 1245 Pt resting w/ eyes closed.    09/20/21 1230 Pt looking around room. VSS.    09/20/21 1215 VSS. Friend visiting.    09/20/21 1200 VSS. Pt resting w/ eyes closed.    09/20/21 1145 VSS. Pt resting w/ eyes closed.    09/20/21 1135 Treatment started per protocol.        Post Treatment     Date/Time Rinseback Volume (mL) On Line Clearance: spKt/V Total Liters Processed (L/min) Dialyzer Clearance    09/20/21 1503 300 mL 1.43 spKt/V 71.9 L/min Lightly streaked          Hemodialysis I/O     Date/Time Total Hemodialysis Replacement Volume (mL) Total Ultrafiltrate Output (mL)    09/20/21 1503 -- 900 mL          [id number] - Medicaitons Given During Treatment  (last 4 hrs)         CHRISTINE M DUFFY, RN       Medication Name Action Time Action Route Rate Dose User     insulin regular (HumuLIN,NovoLIN) injection 0-20 Units 09/20/21 1315 Not Given Subcutaneous   Caleen Jobs Duffy, RN                  Patient tolerated treatment in a  Bed.

## 2021-09-20 NOTE — Unmapped (Signed)
MICU Daily Progress Note     Date of Service: 09/20/2021    Problem List:   Principal Problem:    Acute respiratory distress syndrome (ARDS) due to COVID-19 virus (CMS-HCC)  Active Problems:    Hypercholesterolemia    Hypertension    History of kidney transplant    Renal cell carcinoma (CMS-HCC)    Immunosuppression-related infectious disease (CMS-HCC)    Bilateral lower extremity edema    Elevated troponin I level    GERD (gastroesophageal reflux disease)    Diastolic heart failure (CMS-HCC)    Acute on chronic HFrEF (heart failure with reduced ejection fraction) (CMS-HCC)    COVID    ATN (acute tubular necrosis) (CMS-HCC)    Acute renal failure with tubular necrosis (CMS-HCC)  Resolved Problems:    * No resolved hospital problems. *      HPI: Miguel Ward is a 70 y.o. male renal transplant from lupus nephritis (complex course over last 15 years since transplant followed here including renal cell) here with acute hypoxic respiratory failure, COVID 19 and shock with AKI.    24hr events:  - tx 1 unit PRBC     Neurological   #acute metabolic encephalopathy  - analegsia prn  - CT head 9/29 no acute findings  - neuro exam improving >> sustained wakefulness and interaction, non focal. Remains intermittently drowsy and weak     ? Critical illness polyneuropathy:  - cont pt/ot/speech     Pulmonary   #ARDS, Acute resp failure, resolving   - intubated 9/25 on minimal settings, passing SBTs yet precluded extubation for mentation  - bronched 9/29 secretions blood tinged in trach and mainstem airways, none seen in distal airways  - passed SBT w improved wakefulness and ability to clear cough, extubated to [place] on 10/2  - tolerating RA well, but with rhonchi and weak cough. cont chest PT/nebs / NT suctioning        Cardiovascular   #septic shock, elevated trop  #HFrEF, ho htn, hld, cad  - formal echo 9/26 EF @ 30%, 2020 ECHO 40-45%  - cont home regimen asa (holding statin due to elevated lfts)   - hold home regimen metop, vasotec, lasix  - SDS 9/26--> 10/1  - levo for MAP>65, off since 10/2    Renal   #AKI (baseline Cr 2.7-3), AGMA likely ATN 2/2 sepsis and hypotension  #hx renal transplant s/t lupus nephritis, c/b rejection and renal cell CA s/p cryo ablation  - worsening renal fxn and CRRT started 9/26--> 10/4.   - continue tac with goal of 2-4   -  prednisone 15 mg , but will lower to 10 mg 10/6 x 5 days and then to home dose of 5mg    - continue to hold cellcept  - cont magox and calcitriol  - replete electrolytes prn  -  iHD 10/6 - tolerated well.  700 removed .  Plan 1L removal today (10/8)     Infectious Disease/Autoimmune   #COVID 19 infection  Symptom onset:??9/22  Exposure: unknown  COVID PCR+: neg x 3  (home test positive 9/23?)  Day of admission/transfer: 9/25  COVID Specific??Meds:??none given   Vaccination status: full & boosted x once, evushield given 8/17  3 PCRs since admission negative and taken off precautions   ??  9/25 Bcx neg   9/25 Ucx neg UA 3 WBC  9/25 Lower resp cx, OPF  9/25 SARS-CoV-2 negative for NP and trach aspirate  9/25 CrAg negative  9/26 RPP  negative  9/26 Legionella Ag (urine) positive  9/26 Legionella PCR positive  9/26 CMV PCR negative  ??  Multifocal PNA:   - on vanco/cefepime on 9/25, stopped 10/3 per ICID  - MRSA screen neg and vanco stopped 9/27, restarted 9/29 in the setting of worsening pressor requirements and GPC seen on BAL from Bronch on 9/29  - CT Chest 9/29 c/w volume overload, pna and rt pleural effusion    Legionella, PNA   - levofloxacin started 9/26 was stopped at 10 day and will resume to finish 10/12   ??  Cultures:  Blood Culture, Routine (no units)   Date Value   09/11/2021 No Growth at 5 days     Urine Culture, Comprehensive (no units)   Date Value   27-Sep-2021 NO GROWTH     Lower Respiratory Culture (no units)   Date Value   09/11/2021 Specimen Not Processed   09-27-21 OROPHARYNGEAL FLORA ISOLATED     WBC (10*9/L)   Date Value   09/20/2021 14.5 (H)     WBC, UA (/HPF)   Date Value September 27, 2021 <1        FEN/GI   #gerd  - TF tol well at goal + dysphagia 6 diet   - ppi for gi ppx  - bowel regimen held for loose stools    Transaminitis and Elevated bilirubin: overall all uptrending, [place] RUQ didn't demonstrated acute cholecystitis.  - continue to trend LFTs  - potential need for HIDA if continues to increase holding off given bilirubin downtrending   -  [place] negative.  Also stopped statin and abx in the past 24hrs so will monitor -->  Fluctuating, CTM     Malnutrition Assessment: Not done yet.  Body mass index is 22.68 kg/m??.        Heme/Coag   Rt groin bleeding s/p line removal, resolved  - was on heparin gtt for NSTEMI and afib- stopped 9/27  - continue SQH for DVT ppx  - HH, plts, coags acceptable  - SCDs for DVT ppx  - last transfused 10/6.  Still oozing from old line sites and insulin injection sites.  DIC panel ok. -   - dose of DDAVP 10/8     Endocrine   #hypoglycemic, hypothryoid  - cont ADI + accu checks , NPH 25 mg bid ==> endotool 10/5 --> NPH 15 bid   - hold home regimen allopurinol  - cont home regimen synthroid    Integumentary   #NAI  - WOCN consulted for high risk skin assessment No. Reason: no alterations in skin.  - WOCN recs >> agree with assmt and plan 9/30  - cont pressure mitigating precautions per skin policy    Prophylaxis/LDA/Restraints/Consults   Can CVC be removed? No: dialysis catheter   Can A-line be removed?n/a   Can Foley be removed? No: Need continuous I/O  Mobility plan: Step 2 - Head of bed elevation (>60 degrees)    Feeding: Tube feeds at goal  Analgesia: Pain adequately controlled  Sedation SAT/SBT: Yes  Thromboembolic ppx: SQ heparin  Head of bed >30 degrees: Yes  Ulcer ppx: Yes, home use continued  Glucose within target range: Yes, in range    Does patient need/have an active type/screen? Yes    RASS at goal? No - adjusting sedation or order to reflect need  [place] Agitation Assessment Scale (RASS) : -1 (09/20/2021  8:00 AM)     Can antipsychotics be stopped? N/A, not on antipsychotics  CAM-ICU Result: Positive (09/10/2021  8:00 PM)      Would hospice care be appropriate for this patient? No, patient improving or expected to improve  Any unaddressed hospice/palliative care needs? no    Patient Lines/Drains/Airways Status     Active Active Lines, Drains, & Airways     Name Placement date Placement time Site Days    Hemodialysis Catheter With Distal Infusion Port 09/08/21 Right Internal jugular 09/08/21  1600  Internal jugular  11    NG/OG Tube Feedings Left nostril 09/14/21  1500  Left nostril  5    Urethral Catheter Temperature probe 16 Fr. 09/10/2021  1602  Temperature probe  12    Arteriovenous Fistula - Vein Graft  Access 11/01/18 Right;Upper Arm 11/01/18  --  Arm  1054              Patient Lines/Drains/Airways Status     Active Wounds     Name Placement date Placement time Site Days    Surgical Site 09/17/21 Leg Right;Other (Comment) 09/17/21  1000  -- 3    Wound 09/09/21 Pressure Injury Sacrum Right;Mid dark red nonblanching with dark skin surrounding DTI 09/09/21  2130  Sacrum  10                Goals of Care     Code Status: Full Code    Designated Healthcare Decision Maker:  Miguel Ward current decisional capacity for healthcare decision-making is Full capacity. His designated healthcare decision maker(s) is/are   HCDM (patient stated preference): Patchin,Katherine - Spouse - [phone].    Subjective   Sleepy, but awakes to voice and responds appropriately     Objective     Vitals - past 24 hours  Temp:  [36 ??C (96.8 ??F)-37.4 ??C (99.3 ??F)] 36.6 ??C (97.9 ??F)  Heart Rate:  [59-108] 97  SpO2 Pulse:  [57-124] 78  Resp:  [11-22] 18  BP: (119-184)/(44-86) 124/60  SpO2:  [93 %-99 %] 98 % Intake/Output  I/O last 3 completed shifts:  In: 2372.3 [I.V.:12.3; Blood:280; NG/GT:2080]  Out: 630 [Urine:630]     Physical Exam:    General: thin elderly male, in NAD   HEENT: PERRL, slightly icteric sclera w edema  CV: RRR, no m/r/g, 2+ BLE edema   Pulm: diminished throughout, scattered rhonchi, dec bases  GI: abd round, non tender  MSK: grossly intact, tone intact  Skin: grossly intact, warm  Neuro: FSC, CN grossly intact, non focal motor, BUE 2/5, BLE 2/5       Continuous Infusions:       Scheduled Medications:   ??? aspirin  81 mg Enteral tube: gastric  Daily   ??? calcitRIOL  0.25 mcg Enteral tube: gastric  Q MWF   ??? cellulose, oxidized reg 1X 2 pad   Topical Once   ??? esomeprazole  40 mg Enteral tube: gastric  daily   ??? heparin (porcine)  5,000 Units Subcutaneous Q12H [facility]   ??? flu vacc qs2022-23 6mos up(PF)  0.5 mL Intramuscular During hospitalization   ??? insulin NPH  15 Units Subcutaneous Q12H [facility]   ??? insulin regular  0-20 Units Subcutaneous Q6H [facility]   ??? levoFLOXacin  500 mg Intravenous Q48H   ??? levothyroxine  250 mcg Enteral tube: gastric  daily   ??? magnesium oxide  400 mg Enteral tube: gastric  BID   ??? predniSONE  10 mg Oral Daily   ??? [START ON 10-Oct-2021] predniSONE  5 mg Oral Daily   ??? sodium zirconium cyclosilicate  10 g  Enteral tube: gastric  Daily   ??? Tacrolimus  1 mg Enteral tube: gastric  BID   ??? white petrolatum-mineral oil  1 application Both Eyes BID       PRN medications:  albumin human bottle 25 %, dextrose in water, heparin (porcine), heparin (porcine), ondansetron, sodium chloride    Data/Imaging Review: Reviewed in Epic and personally interpreted on 09/20/2021. See EMR for detailed results.       Yanuel Tagg Paulino Door, PA

## 2021-09-20 NOTE — Unmapped (Signed)
Patient AAOx4, quiet volume. NGT CDI with Nepro @ 55 infusing. Pt is at goal and has no residual. Pt denies pain. Pt on RA. Cardiac - Afib. Foley CDI. No BM overnight. Pt has decreased UO. Pt repositioned q2. RIJ HD cath CDI. 1UPRBC given for hgb 6.7. RN WCTM  Problem: Fall Injury Risk  Goal: Absence of Fall and Fall-Related Injury  Outcome: Ongoing - Unchanged  Intervention: Promote Injury-Free Environment  Recent Flowsheet Documentation  Taken 09/19/2021 2000 by Walker Shadow, RN  Safety Interventions:  ??? bed alarm  ??? enteral feeding safety  ??? environmental modification  ??? fall reduction program maintained  ??? family at bedside  ??? lighting adjusted for tasks/safety  ??? low bed  ??? nonskid shoes/slippers when out of bed     Problem: Infection  Goal: Absence of Infection Signs and Symptoms  Outcome: Ongoing - Unchanged  Intervention: Prevent or Manage Infection  Recent Flowsheet Documentation  Taken 09/19/2021 2000 by Walker Shadow, RN  Infection Management: aseptic technique maintained     Problem: Adult Inpatient Plan of Care  Goal: Plan of Care Review  Outcome: Ongoing - Unchanged  Goal: Patient-Specific Goal (Individualized)  Outcome: Ongoing - Unchanged  Goal: Absence of Hospital-Acquired Illness or Injury  Outcome: Ongoing - Unchanged  Intervention: Identify and Manage Fall Risk  Recent Flowsheet Documentation  Taken 09/19/2021 2000 by Walker Shadow, RN  Safety Interventions:  ??? bed alarm  ??? enteral feeding safety  ??? environmental modification  ??? fall reduction program maintained  ??? family at bedside  ??? lighting adjusted for tasks/safety  ??? low bed  ??? nonskid shoes/slippers when out of bed  Intervention: Prevent Skin Injury  Recent Flowsheet Documentation  Taken 09/19/2021 2000 by Walker Shadow, RN  Skin Protection:  ??? adhesive use limited  ??? incontinence pads utilized  ??? tubing/devices free from skin contact  ??? transparent dressing maintained  Intervention: Prevent and Manage VTE (Venous Thromboembolism) Risk  Recent Flowsheet Documentation  Taken 09/19/2021 2000 by Walker Shadow, RN  Activity Management: activity adjusted per tolerance  VTE Prevention/Management:  ??? anticoagulant therapy  ??? bleeding precautions maintained  ??? bleeding risk factors identified  Intervention: Prevent Infection  Recent Flowsheet Documentation  Taken 09/19/2021 2000 by Walker Shadow, RN  Infection Prevention:  ??? environmental surveillance performed  ??? hand hygiene promoted  Goal: Optimal Comfort and Wellbeing  Outcome: Ongoing - Unchanged  Goal: Readiness for Transition of Care  Outcome: Ongoing - Unchanged  Goal: Rounds/Family Conference  Outcome: Ongoing - Unchanged     Problem: Skin Injury Risk Increased  Goal: Skin Health and Integrity  Outcome: Ongoing - Unchanged  Intervention: Optimize Skin Protection  Recent Flowsheet Documentation  Taken 09/19/2021 2000 by Walker Shadow, RN  Pressure Reduction Techniques:  ??? frequent weight shift encouraged  ??? weight shift assistance provided  Pressure Reduction Devices: pressure-redistributing mattress utilized  Skin Protection:  ??? adhesive use limited  ??? incontinence pads utilized  ??? tubing/devices free from skin contact  ??? transparent dressing maintained     Problem: Dysrhythmia (Heart Failure)  Goal: Stable Heart Rate and Rhythm  Outcome: Ongoing - Unchanged     Problem: Fluid Imbalance (Heart Failure)  Goal: Fluid Balance  Outcome: Ongoing - Unchanged     Problem: Respiratory Compromise (Heart Failure)  Goal: Effective Oxygenation and Ventilation  Outcome: Ongoing - Unchanged     Problem: Impaired Wound Healing  Goal: Optimal Wound Healing  Outcome: Ongoing - Unchanged  Intervention: Promote Wound Healing  Recent Flowsheet  Documentation  Taken 09/19/2021 2000 by Walker Shadow, RN  Activity Management: activity adjusted per tolerance     Problem: Self-Care Deficit  Goal: Improved Ability to Complete Activities of Daily Living  Outcome: Ongoing - Unchanged     Problem: Diabetes Comorbidity  Goal: Blood Glucose Level Within Targeted Range  Outcome: Ongoing - Unchanged     Problem: Heart Failure Comorbidity  Goal: Maintenance of Heart Failure Symptom Control  Outcome: Ongoing - Unchanged     Problem: Hypertension Comorbidity  Goal: Blood Pressure in Desired Range  Outcome: Ongoing - Unchanged     Problem: Device-Related Complication Risk (Hemodialysis)  Goal: Safe, Effective Therapy Delivery  Outcome: Ongoing - Unchanged     Problem: Hemodynamic Instability (Hemodialysis)  Goal: Effective Tissue Perfusion  Outcome: Ongoing - Unchanged     Problem: Infection (Hemodialysis)  Goal: Absence of Infection Signs and Symptoms  Outcome: Ongoing - Unchanged

## 2021-09-20 NOTE — Unmapped (Signed)
Dialysis treatment started per protocol. Will continue to monitor throughout.

## 2021-09-20 NOTE — Unmapped (Signed)
[facility] Nephrology Hemodialysis Procedure Note     09/20/2021    Miguel Ward was seen and examined on hemodialysis    CHIEF COMPLAINT: Acute Kidney Disease    INTERVAL HISTORY: BP holding during dialysis. UO 470 ml/24h    DIALYSIS TREATMENT DATA:  Patient Goal Weight: 1.5L  Dialyzer: F-180 (98 mLs)  Dialysis Bath  Bath: 2 K+ / 2.5 Ca+  Dialysate Na (mEq/L): 137 mEq/L  Dialysate HCO3 (mEq/L): 35 mEq/L  Blood Flow Rate (mL/min): 350 mL/min  Dialysis Flow (mL/min): 800 mL/min    PHYSICAL EXAM:  Vitals:  Temp:  [36.7 ??C (98 ??F)-37.6 ??C (99.7 ??F)] 37.4 ??C (99.3 ??F)  Heart Rate:  [80-118] 100  BP: (126-181)/(50-144) 133/51  MAP (mmHg):  [75-156] 88    General: ill appearing currently dialyzing in micu bed  Pulmonary: coarse upper airway sounds  Cardiovascular: RRR  Extremities: + pitting edema  Access: RUE AV fistula, arm swollen    LAB DATA:  Lab Results   Component Value Date    NA 143 09/20/2021    K 5.0 (H) 09/20/2021    CL 105 09/20/2021    CO2 31.0 09/20/2021    BUN 106 (H) 09/20/2021    CREATININE 2.70 (H) 09/20/2021    CALCIUM 8.8 09/20/2021    MG 2.2 09/20/2021    PHOS 4.5 09/20/2021    ALBUMIN 1.9 (L) 09/20/2021      Lab Results   Component Value Date    HCT 20.7 (L) 09/20/2021    WBC 14.5 (H) 09/20/2021        ASSESSMENT/PLAN:  Acute Kidney Disease on Intermittent Hemodialysis:  UF goal: 1L as tolerated  Adjust medications for a GFR <10 ml/min  Avoid nephrotoxic agents  Last HD Treatment:Started (09/20/21)     Bone Mineral Metabolism:  Lab Results   Component Value Date    CALCIUM 8.8 09/20/2021    CALCIUM 8.7 09/19/2021    Lab Results   Component Value Date    ALBUMIN 1.9 (L) 09/20/2021    ALBUMIN 2.0 (L) 09/19/2021      Lab Results   Component Value Date    PHOS 4.5 09/20/2021    PHOS 4.1 09/19/2021    Lab Results   Component Value Date    PTH 566.1 (H) 07/30/2021    PTH 346.8 (H) 05/02/2020      Labs appropriate, no changes.    Anemia:   Lab Results   Component Value Date    HGB 6.7 (L) 09/20/2021    HGB 7.1 (L) 09/19/2021    HGB 7.6 (L) 09/19/2021    Iron Saturation (%)   Date Value Ref Range Status   09/17/2021 41 20 - 55 % Final   03/28/2014 47 20 - 50 % Final      Lab Results   Component Value Date    FERRITIN 2,080.9 (H) 09/18/2021       potential epoetin initiation if requiring sustained renal replacement therapy.    Vascular Access:  Vascular Access functioning well - no need for intervention  Blood Flow Rate (mL/min): 350 mL/min    immunosuppression: tacrolimus 1mg  suspension bid; 12hr trough goal 2-4 ng/ml. prednisone 10 mg qd    Kinnie Feil, MD  [facility] Division of Nephrology & Hypertension

## 2021-09-20 NOTE — Unmapped (Signed)
Problem: Fall Injury Risk  Goal: Absence of Fall and Fall-Related Injury  Outcome: Ongoing - Unchanged     Problem: Infection  Goal: Absence of Infection Signs and Symptoms  Outcome: Ongoing - Unchanged     Problem: Adult Inpatient Plan of Care  Goal: Plan of Care Review  Outcome: Ongoing - Unchanged  Goal: Patient-Specific Goal (Individualized)  Outcome: Ongoing - Unchanged  Goal: Absence of Hospital-Acquired Illness or Injury  Outcome: Ongoing - Unchanged  Goal: Optimal Comfort and Wellbeing  Outcome: Ongoing - Unchanged  Goal: Readiness for Transition of Care  Outcome: Ongoing - Unchanged  Goal: Rounds/Family Conference  Outcome: Ongoing - Unchanged     Problem: Skin Injury Risk Increased  Goal: Skin Health and Integrity  Outcome: Ongoing - Unchanged     Problem: Dysrhythmia (Heart Failure)  Goal: Stable Heart Rate and Rhythm  Outcome: Ongoing - Unchanged     Problem: Fluid Imbalance (Heart Failure)  Goal: Fluid Balance  Outcome: Ongoing - Unchanged     Problem: Respiratory Compromise (Heart Failure)  Goal: Effective Oxygenation and Ventilation  Outcome: Ongoing - Unchanged     Problem: Impaired Wound Healing  Goal: Optimal Wound Healing  Outcome: Ongoing - Unchanged     Problem: Self-Care Deficit  Goal: Improved Ability to Complete Activities of Daily Living  Outcome: Ongoing - Unchanged     Problem: Diabetes Comorbidity  Goal: Blood Glucose Level Within Targeted Range  Outcome: Ongoing - Unchanged     Problem: Heart Failure Comorbidity  Goal: Maintenance of Heart Failure Symptom Control  Outcome: Ongoing - Unchanged     Problem: Hypertension Comorbidity  Goal: Blood Pressure in Desired Range  Outcome: Ongoing - Unchanged     Problem: Device-Related Complication Risk (Hemodialysis)  Goal: Safe, Effective Therapy Delivery  Outcome: Ongoing - Unchanged     Problem: Hemodynamic Instability (Hemodialysis)  Goal: Effective Tissue Perfusion  Outcome: Ongoing - Unchanged     Problem: Infection (Hemodialysis)  Goal: Absence of Infection Signs and Symptoms  Outcome: Ongoing - Unchanged

## 2021-09-21 LAB — BLOOD GAS CRITICAL CARE PANEL, VENOUS
BASE EXCESS VENOUS: 7 — ABNORMAL HIGH (ref -2.0–2.0)
BASE EXCESS VENOUS: 6.4 — ABNORMAL HIGH (ref -2.0–2.0)
CALCIUM IONIZED VENOUS (MG/DL): 5.02 mg/dL (ref 4.40–5.40)
CALCIUM IONIZED VENOUS (MG/DL): 5.1 mg/dL (ref 4.40–5.40)
GLUCOSE WHOLE BLOOD: 112 mg/dL (ref 70–179)
GLUCOSE WHOLE BLOOD: 108 mg/dL (ref 70–179)
HCO3 VENOUS: 31 mmol/L — ABNORMAL HIGH (ref 22–27)
HCO3 VENOUS: 31 mmol/L — ABNORMAL HIGH (ref 22–27)
HEMOGLOBIN BLOOD GAS: 9.2 g/dL — ABNORMAL LOW
HEMOGLOBIN BLOOD GAS: 7.3 g/dL — ABNORMAL LOW
LACTATE BLOOD VENOUS: 1.8 mmol/L (ref 0.5–1.8)
LACTATE BLOOD VENOUS: 1.4 mmol/L (ref 0.5–1.8)
O2 SATURATION VENOUS: 70 % (ref 40.0–85.0)
O2 SATURATION VENOUS: 72 % (ref 40.0–85.0)
PCO2 VENOUS: 47 mmHg (ref 40–60)
PCO2 VENOUS: 47 mmHg (ref 40–60)
PH VENOUS: 7.44 — ABNORMAL HIGH (ref 7.32–7.43)
PH VENOUS: 7.43 (ref 7.32–7.43)
PO2 VENOUS: 37 mmHg (ref 30–55)
PO2 VENOUS: 39 mmHg (ref 30–55)
POTASSIUM WHOLE BLOOD: 4 mmol/L (ref 3.4–4.6)
POTASSIUM WHOLE BLOOD: 4.1 mmol/L (ref 3.4–4.6)
SODIUM WHOLE BLOOD: 140 mmol/L (ref 135–145)
SODIUM WHOLE BLOOD: 140 mmol/L (ref 135–145)

## 2021-09-21 LAB — BASIC METABOLIC PANEL
ANION GAP: 6 mmol/L (ref 5–14)
BLOOD UREA NITROGEN: 65 mg/dL — ABNORMAL HIGH (ref 9–23)
BUN / CREAT RATIO: 33
CALCIUM: 8.6 mg/dL — ABNORMAL LOW (ref 8.7–10.4)
CHLORIDE: 104 mmol/L (ref 98–107)
CO2: 31 mmol/L (ref 20.0–31.0)
CREATININE: 1.95 mg/dL — ABNORMAL HIGH
EGFR CKD-EPI (2021) MALE: 37 mL/min/{1.73_m2} — ABNORMAL LOW (ref >=60)
GLUCOSE RANDOM: 113 mg/dL (ref 70–179)
POTASSIUM: 4.3 mmol/L (ref 3.4–4.8)
SODIUM: 141 mmol/L (ref 135–145)

## 2021-09-21 LAB — HEMOGLOBIN AND HEMATOCRIT, BLOOD
HEMATOCRIT: 20.7 % — ABNORMAL LOW (ref 39.0–48.0)
HEMATOCRIT: 22.8 % — ABNORMAL LOW (ref 39.0–48.0)
HEMOGLOBIN: 6.9 g/dL — ABNORMAL LOW (ref 12.9–16.5)
HEMOGLOBIN: 7.6 g/dL — ABNORMAL LOW (ref 12.9–16.5)

## 2021-09-21 LAB — CBC W/ AUTO DIFF
BASOPHILS ABSOLUTE COUNT: 0 10*9/L (ref 0.0–0.1)
BASOPHILS RELATIVE PERCENT: 0.4 %
EOSINOPHILS ABSOLUTE COUNT: 0 10*9/L (ref 0.0–0.5)
EOSINOPHILS RELATIVE PERCENT: 0.3 %
HEMATOCRIT: 21.4 % — ABNORMAL LOW (ref 39.0–48.0)
HEMOGLOBIN: 7 g/dL — ABNORMAL LOW (ref 12.9–16.5)
LYMPHOCYTES ABSOLUTE COUNT: 0.7 10*9/L — ABNORMAL LOW (ref 1.1–3.6)
LYMPHOCYTES RELATIVE PERCENT: 5.5 %
MEAN CORPUSCULAR HEMOGLOBIN CONC: 32.9 g/dL (ref 32.0–36.0)
MEAN CORPUSCULAR HEMOGLOBIN: 29 pg (ref 25.9–32.4)
MEAN CORPUSCULAR VOLUME: 88.3 fL (ref 77.6–95.7)
MEAN PLATELET VOLUME: 10.6 fL (ref 6.8–10.7)
MONOCYTES ABSOLUTE COUNT: 0.9 10*9/L — ABNORMAL HIGH (ref 0.3–0.8)
MONOCYTES RELATIVE PERCENT: 6.6 %
NEUTROPHILS ABSOLUTE COUNT: 11.3 10*9/L — ABNORMAL HIGH (ref 1.8–7.8)
NEUTROPHILS RELATIVE PERCENT: 87.2 %
PLATELET COUNT: 118 10*9/L — ABNORMAL LOW (ref 150–450)
RED BLOOD CELL COUNT: 2.42 10*12/L — ABNORMAL LOW (ref 4.26–5.60)
RED CELL DISTRIBUTION WIDTH: 16.4 % — ABNORMAL HIGH (ref 12.2–15.2)
WBC ADJUSTED: 13 10*9/L — ABNORMAL HIGH (ref 3.6–11.2)

## 2021-09-21 LAB — HEPATIC FUNCTION PANEL
ALBUMIN: 1.8 g/dL — ABNORMAL LOW (ref 3.4–5.0)
ALKALINE PHOSPHATASE: 440 U/L — ABNORMAL HIGH (ref 46–116)
ALT (SGPT): 233 U/L — ABNORMAL HIGH (ref 10–49)
AST (SGOT): 160 U/L — ABNORMAL HIGH (ref <=34)
BILIRUBIN DIRECT: 0.4 mg/dL — ABNORMAL HIGH (ref 0.00–0.30)
BILIRUBIN TOTAL: 0.6 mg/dL (ref 0.3–1.2)
PROTEIN TOTAL: 4.9 g/dL — ABNORMAL LOW (ref 5.7–8.2)

## 2021-09-21 LAB — PHOSPHORUS: PHOSPHORUS: 4.1 mg/dL (ref 2.4–5.1)

## 2021-09-21 LAB — MAGNESIUM: MAGNESIUM: 1.9 mg/dL (ref 1.6–2.6)

## 2021-09-21 MED ADMIN — tacrolimus (PROGRAF) oral suspension: 1 mg | GASTROENTERAL | @ 13:00:00

## 2021-09-21 MED ADMIN — heparin (porcine) 5,000 unit/mL injection 5,000 Units: 5000 [IU] | SUBCUTANEOUS

## 2021-09-21 MED ADMIN — white petrolatum-mineral oil (SOOTHE PM) 80-20 % ophthalmic ointment 1 application: 1 | OPHTHALMIC

## 2021-09-21 MED ADMIN — magnesium oxide (MAG-OX) tablet 400 mg: 400 mg | GASTROENTERAL

## 2021-09-21 MED ADMIN — tacrolimus (PROGRAF) oral suspension: 1 mg | GASTROENTERAL

## 2021-09-21 MED ADMIN — white petrolatum-mineral oil (SOOTHE PM) 80-20 % ophthalmic ointment 1 application: 1 | OPHTHALMIC | @ 13:00:00

## 2021-09-21 MED ADMIN — insulin NPH (HumuLIN,NovoLIN) injection 10 Units: 10 [IU] | SUBCUTANEOUS | @ 22:00:00

## 2021-09-21 MED ADMIN — heparin (porcine) 5,000 unit/mL injection 5,000 Units: 5000 [IU] | SUBCUTANEOUS | @ 13:00:00 | Stop: 2021-09-21

## 2021-09-21 MED ADMIN — magnesium oxide (MAG-OX) tablet 400 mg: 400 mg | GASTROENTERAL | @ 13:00:00

## 2021-09-21 MED ADMIN — levothyroxine (SYNTHROID) tablet 250 mcg: 250 ug | GASTROENTERAL | @ 10:00:00

## 2021-09-21 MED ADMIN — predniSONE (DELTASONE) tablet 10 mg: 10 mg | ORAL | @ 13:00:00 | Stop: 2021-09-23

## 2021-09-21 MED ADMIN — sodium zirconium cyclosilicate (LOKELMA) packet 10 g: 10 g | GASTROENTERAL | @ 13:00:00

## 2021-09-21 MED ADMIN — esomeprazole (NexIUM) granules 40 mg: 40 mg | GASTROENTERAL | @ 10:00:00

## 2021-09-21 MED ADMIN — insulin NPH (HumuLIN,NovoLIN) injection 10 Units: 10 [IU] | SUBCUTANEOUS | @ 10:00:00

## 2021-09-21 MED ADMIN — aspirin chewable tablet 81 mg: 81.0 mg | GASTROENTERAL | @ 13:00:00

## 2021-09-21 NOTE — Unmapped (Signed)
Mr. Miguel Ward oriented to self only and remained drowsy entire shift.  HD done this shift w/900 ml fluid removed, pt. MAP dropped to 50-55's.  Pt. Remains on room air and required NG suctioning to help remove thick secretions.  Tube Feeds at goal.  No BM, UO .      Problem: Fall Injury Risk  Goal: Absence of Fall and Fall-Related Injury  Outcome: Ongoing - Unchanged  Intervention: Promote Injury-Free Environment  Recent Flowsheet Documentation  Taken 09/20/2021 1200 by Kandis Fantasia, RN  Safety Interventions: lighting adjusted for tasks/safety  Taken 09/20/2021 0800 by Kandis Fantasia, RN  Safety Interventions:   fall reduction program maintained   enteral feeding safety   infection management   low bed   lighting adjusted for tasks/safety     Problem: Infection  Goal: Absence of Infection Signs and Symptoms  Outcome: Ongoing - Unchanged  Intervention: Prevent or Manage Infection  Recent Flowsheet Documentation  Taken 09/20/2021 1200 by Kandis Fantasia, RN  Infection Management: aseptic technique maintained  Taken 09/20/2021 0800 by Kandis Fantasia, RN  Infection Management: aseptic technique maintained     Problem: Adult Inpatient Plan of Care  Goal: Plan of Care Review  Outcome: Ongoing - Unchanged  Goal: Patient-Specific Goal (Individualized)  Outcome: Ongoing - Unchanged  Goal: Absence of Hospital-Acquired Illness or Injury  Outcome: Ongoing - Unchanged  Intervention: Identify and Manage Fall Risk  Recent Flowsheet Documentation  Taken 09/20/2021 1200 by Kandis Fantasia, RN  Safety Interventions: lighting adjusted for tasks/safety  Taken 09/20/2021 0800 by Kandis Fantasia, RN  Safety Interventions:   fall reduction program maintained   enteral feeding safety   infection management   low bed   lighting adjusted for tasks/safety  Intervention: Prevent Skin Injury  Recent Flowsheet Documentation  Taken 09/20/2021 1600 by Kandis Fantasia, RN  Skin Protection: adhesive use limited  Taken 09/20/2021 1200 by Kandis Fantasia, RN  Skin Protection: adhesive use limited  Taken 09/20/2021 1000 by Kandis Fantasia, RN  Skin Protection:   adhesive use limited   silicone foam dressing in place   incontinence pads utilized   cleansing with dimethicone incontinence wipes  Intervention: Prevent and Manage VTE (Venous Thromboembolism) Risk  Recent Flowsheet Documentation  Taken 09/20/2021 1000 by Kandis Fantasia, RN  Range of Motion: Bilateral Upper and Lower Extremities  Intervention: Prevent Infection  Recent Flowsheet Documentation  Taken 09/20/2021 0800 by Kandis Fantasia, RN  Infection Prevention: environmental surveillance performed  Goal: Optimal Comfort and Wellbeing  Outcome: Ongoing - Unchanged  Goal: Readiness for Transition of Care  Outcome: Ongoing - Unchanged  Goal: Rounds/Family Conference  Outcome: Ongoing - Unchanged     Problem: Skin Injury Risk Increased  Goal: Skin Health and Integrity  Outcome: Ongoing - Unchanged  Intervention: Optimize Skin Protection  Recent Flowsheet Documentation  Taken 09/20/2021 1800 by Kandis Fantasia, RN  Pressure Reduction Techniques: frequent weight shift encouraged  Pressure Reduction Devices: pressure-redistributing mattress utilized  Taken 09/20/2021 1600 by Kandis Fantasia, RN  Pressure Reduction Techniques: frequent weight shift encouraged  Head of Bed (HOB) Positioning: HOB at 30 degrees  Pressure Reduction Devices: pressure-redistributing mattress utilized  Skin Protection: adhesive use limited  Taken 09/20/2021 1200 by Kandis Fantasia, RN  Pressure Reduction Techniques: frequent weight shift encouraged  Head of Bed (HOB) Positioning: HOB at 30 degrees  Pressure Reduction Devices: pressure-redistributing mattress utilized  Skin Protection: adhesive use limited  Taken 09/20/2021 1000 by  Caleen Jobs Charne Mcbrien, RN  Pressure Reduction Techniques: frequent weight shift encouraged  Head of Bed (HOB) Positioning: HOB at 30-45 degrees  Pressure Reduction Devices: pressure-redistributing mattress utilized  Skin Protection:   adhesive use limited   silicone foam dressing in place   incontinence pads utilized   cleansing with dimethicone incontinence wipes  Taken 09/20/2021 0800 by Kandis Fantasia, RN  Pressure Reduction Techniques: frequent weight shift encouraged  Head of Bed (HOB) Positioning: HOB at 30-45 degrees  Pressure Reduction Devices: pressure-redistributing mattress utilized     Problem: Dysrhythmia (Heart Failure)  Goal: Stable Heart Rate and Rhythm  Outcome: Ongoing - Unchanged     Problem: Fluid Imbalance (Heart Failure)  Goal: Fluid Balance  Outcome: Ongoing - Unchanged     Problem: Respiratory Compromise (Heart Failure)  Goal: Effective Oxygenation and Ventilation  Outcome: Ongoing - Unchanged     Problem: Device-Related Complication Risk (Mechanical Ventilation, Invasive)  Goal: Optimal Device Function  Intervention: Optimize Device Care and Function  Recent Flowsheet Documentation  Taken 09/20/2021 1200 by Kandis Fantasia, RN  Aspiration Precautions: NPO pending swallow screening/evaluation  Taken 09/20/2021 0800 by Kandis Fantasia, RN  Aspiration Precautions: NPO pending swallow screening/evaluation     Problem: Impaired Wound Healing  Goal: Optimal Wound Healing  Outcome: Ongoing - Unchanged     Problem: Self-Care Deficit  Goal: Improved Ability to Complete Activities of Daily Living  Outcome: Ongoing - Unchanged     Problem: Diabetes Comorbidity  Goal: Blood Glucose Level Within Targeted Range  Outcome: Ongoing - Unchanged  Intervention: Monitor and Manage Glycemia  Recent Flowsheet Documentation  Taken 09/20/2021 1200 by Kandis Fantasia, RN  Glycemic Management: blood glucose monitored     Problem: Heart Failure Comorbidity  Goal: Maintenance of Heart Failure Symptom Control  Outcome: Ongoing - Unchanged     Problem: Hypertension Comorbidity  Goal: Blood Pressure in Desired Range  Outcome: Ongoing - Unchanged     Problem: Device-Related Complication Risk (Hemodialysis)  Goal: Safe, Effective Therapy Delivery  Outcome: Ongoing - Unchanged     Problem: Hemodynamic Instability (Hemodialysis)  Goal: Effective Tissue Perfusion  Outcome: Ongoing - Unchanged     Problem: Infection (Hemodialysis)  Goal: Absence of Infection Signs and Symptoms  Outcome: Ongoing - Unchanged

## 2021-09-21 NOTE — Unmapped (Signed)
Pt drowsy and oriented to self. Pt denies pain. Pt suctioned to help clear secretions. RIJ HD Dry and intact. AFIB and on RA. NGT CDI. Foley CDI with decreased urine output. Pt on iHD T/TH/S. Dressing to sacrum CDI. Rn WCTM  Problem: Fall Injury Risk  Goal: Absence of Fall and Fall-Related Injury  Outcome: Ongoing - Unchanged     Problem: Infection  Goal: Absence of Infection Signs and Symptoms  Outcome: Ongoing - Unchanged     Problem: Adult Inpatient Plan of Care  Goal: Plan of Care Review  Outcome: Ongoing - Unchanged  Goal: Patient-Specific Goal (Individualized)  Outcome: Ongoing - Unchanged  Goal: Absence of Hospital-Acquired Illness or Injury  Outcome: Ongoing - Unchanged  Goal: Optimal Comfort and Wellbeing  Outcome: Ongoing - Unchanged  Goal: Readiness for Transition of Care  Outcome: Ongoing - Unchanged  Goal: Rounds/Family Conference  Outcome: Ongoing - Unchanged     Problem: Skin Injury Risk Increased  Goal: Skin Health and Integrity  Outcome: Ongoing - Unchanged     Problem: Dysrhythmia (Heart Failure)  Goal: Stable Heart Rate and Rhythm  Outcome: Ongoing - Unchanged     Problem: Fluid Imbalance (Heart Failure)  Goal: Fluid Balance  Outcome: Ongoing - Unchanged     Problem: Respiratory Compromise (Heart Failure)  Goal: Effective Oxygenation and Ventilation  Outcome: Ongoing - Unchanged     Problem: Impaired Wound Healing  Goal: Optimal Wound Healing  Outcome: Ongoing - Unchanged     Problem: Self-Care Deficit  Goal: Improved Ability to Complete Activities of Daily Living  Outcome: Ongoing - Unchanged     Problem: Diabetes Comorbidity  Goal: Blood Glucose Level Within Targeted Range  Outcome: Ongoing - Unchanged     Problem: Heart Failure Comorbidity  Goal: Maintenance of Heart Failure Symptom Control  Outcome: Ongoing - Unchanged     Problem: Hypertension Comorbidity  Goal: Blood Pressure in Desired Range  Outcome: Ongoing - Unchanged     Problem: Device-Related Complication Risk (Hemodialysis)  Goal: Safe, Effective Therapy Delivery  Outcome: Ongoing - Unchanged     Problem: Hemodynamic Instability (Hemodialysis)  Goal: Effective Tissue Perfusion  Outcome: Ongoing - Unchanged     Problem: Infection (Hemodialysis)  Goal: Absence of Infection Signs and Symptoms  Outcome: Ongoing - Unchanged

## 2021-09-21 NOTE — Unmapped (Cosign Needed)
[facility] Nightshift Note     Date of Service: 09/21/2021    Principal Problem:    Acute respiratory distress syndrome (ARDS) due to COVID-19 virus (CMS-HCC)  Active Problems:    Hypercholesterolemia    Hypertension    History of kidney transplant    Renal cell carcinoma (CMS-HCC)    Immunosuppression-related infectious disease (CMS-HCC)    Bilateral lower extremity edema    Elevated troponin I level    GERD (gastroesophageal reflux disease)    Diastolic heart failure (CMS-HCC)    Acute on chronic HFrEF (heart failure with reduced ejection fraction) (CMS-HCC)    COVID    ATN (acute tubular necrosis) (CMS-HCC)    Acute renal failure with tubular necrosis (CMS-HCC)  Resolved Problems:    * No resolved hospital problems. *          Cross cover summary     No acute overnight events. Stepdown status due to frequent suctioning. Will need repeat CXR in 4-6 weeks to ensure resolution of RUL mass like consolidation.       Kellie Shropshire, MD

## 2021-09-21 NOTE — Unmapped (Signed)
MICU Daily Progress Note     Date of Service: 09/21/2021    Problem List:   Principal Problem:    Acute respiratory distress syndrome (ARDS) due to COVID-19 virus (CMS-HCC)  Active Problems:    Hypercholesterolemia    Hypertension    History of kidney transplant    Renal cell carcinoma (CMS-HCC)    Immunosuppression-related infectious disease (CMS-HCC)    Bilateral lower extremity edema    Elevated troponin I level    GERD (gastroesophageal reflux disease)    Diastolic heart failure (CMS-HCC)    Acute on chronic HFrEF (heart failure with reduced ejection fraction) (CMS-HCC)    COVID    ATN (acute tubular necrosis) (CMS-HCC)    Acute renal failure with tubular necrosis (CMS-HCC)  Resolved Problems:    * No resolved hospital problems. *      HPI: Miguel Ward is a 70 y.o. male renal transplant from lupus nephritis (complex course over last 15 years since transplant followed here including renal cell) here with acute hypoxic respiratory failure, COVID 19 and shock with AKI.    24hr events:  - tx 1 unit PRBC yesterday  - iHD yesterday 900 removed net +1100  - remains stepdown status    Neurological   #acute metabolic encephalopathy  - analegsia prn  - CT head 9/29 no acute findings  - neuro exam improving >> sustained wakefulness and interaction, non focal. Remains intermittently drowsy and weak     ? Critical illness polyneuropathy:  - cont pt/ot/speech     Pulmonary   #ARDS, Acute resp failure, resolving   - intubated 9/25 on minimal settings, passing SBTs yet precluded extubation for mentation  - bronched 9/29 secretions blood tinged in trach and mainstem airways, none seen in distal airways  - passed SBT w improved wakefulness and ability to clear cough, extubated to [place] on 10/2  - tolerating RA well, but with rhonchi and weak cough. cont chest PT/nebs / NT suctioning   - freq suctioning requiring stepdown     Cardiovascular   #septic shock, elevated trop  #HFrEF, ho htn, hld, cad  - formal echo 9/26 EF @ 30%, 2020 ECHO 40-45%  - cont home regimen asa (holding statin due to elevated lfts)   - hold home regimen metop, vasotec, lasix  - SDS 9/26--> 10/1  - levo for MAP>65, off since 10/2    Renal   #AKI (baseline Cr 2.7-3), AGMA likely ATN 2/2 sepsis and hypotension  #hx renal transplant s/t lupus nephritis, c/b rejection and renal cell CA s/p cryo ablation  - worsening renal fxn and CRRT started 9/26--> 10/4.   - continue tac with goal of 2-4   - prednisone 15 mg , but will lower to 10 mg 10/6 x 5 days and then to home dose of 5mg    - continue to hold cellcept  - cont magox and calcitriol  - replete electrolytes prn  -  iHD 10/8 - tolerated well. removed 900     Infectious Disease/Autoimmune   #COVID 19 infection  Symptom onset:??9/22  Exposure: unknown  COVID PCR+: neg x 3  (home test positive 9/23?)  Day of admission/transfer: 9/25  COVID Specific??Meds:??none given   Vaccination status: full & boosted x once, evushield given 8/17  3 PCRs since admission negative and taken off precautions   ??  9/25 Bcx neg   9/25 Ucx neg UA 3 WBC  9/25 Lower resp cx, OPF  9/25 SARS-CoV-2 negative for NP and trach  aspirate  9/25 CrAg negative  9/26 RPP negative  9/26 Legionella Ag (urine) positive  9/26 Legionella PCR positive  9/26 CMV PCR negative  ??  Multifocal PNA:   - on vanco/cefepime on 9/25, stopped 10/3 per ICID  - MRSA screen neg and vanco stopped 9/27, restarted 9/29 in the setting of worsening pressor requirements and GPC seen on BAL from Bronch on 9/29  - CT Chest 9/29 c/w volume overload, pna and rt pleural effusion    Legionella, PNA   - levofloxacin started 9/26 was stopped at 10 day and will resume to finish 10/12   ??  Cultures:  Blood Culture, Routine (no units)   Date Value   09/11/2021 No Growth at 5 days     Urine Culture, Comprehensive (no units)   Date Value   09/01/2021 NO GROWTH     Lower Respiratory Culture (no units)   Date Value   09/11/2021 Specimen Not Processed   09/04/2021 OROPHARYNGEAL FLORA ISOLATED     WBC (10*9/L)   Date Value   09/21/2021 13.0 (H)     WBC, UA (/HPF)   Date Value   09/02/2021 <1        FEN/GI   #gerd  - TF tol well at goal + dysphagia 6 diet   - ppi for gi ppx  - bowel regimen held for loose stools    Transaminitis and Elevated bilirubin (improving): overall all uptrending, [place] RUQ didn't demonstrated acute cholecystitis.  - continue to trend LFTs  - potential need for HIDA if continues to increase holding off given bilirubin downtrending   - [place] negative.  Also stopped statin and abx in the past 24hrs so will monitor -->  Fluctuating, CTM     Malnutrition Assessment: Not done yet.  Body mass index is 22.68 kg/m??.        Heme/Coag   Rt groin bleeding s/p line removal, resolved  - was on heparin gtt for NSTEMI and afib- stopped 9/27  - continue SQH for DVT ppx  - HH, plts, coags acceptable  - SCDs for DVT ppx  - last transfused 10/8.  Still oozing from old line sites and insulin injection sites.  DIC panel ok. -   - dose of DDAVP 10/8     Endocrine   #hypoglycemic, hypothryoid  - cont ADI + accu checks , NPH 25 mg bid ==> endotool 10/5 --> decreased to NPH 10 bid   - hold home regimen allopurinol  - cont home regimen synthroid    Integumentary   #NAI  - WOCN consulted for high risk skin assessment No. Reason: no alterations in skin.  - WOCN recs >> agree with assmt and plan 9/30  - cont pressure mitigating precautions per skin policy    Prophylaxis/LDA/Restraints/Consults   Can CVC be removed? No: dialysis catheter   Can A-line be removed?n/a   Can Foley be removed? No: Need continuous I/O  Mobility plan: Step 2 - Head of bed elevation (>60 degrees)    Feeding: Tube feeds at goal  Analgesia: Pain adequately controlled  Sedation SAT/SBT: N/A  Thromboembolic ppx: SQ heparin  Head of bed >30 degrees: Yes  Ulcer ppx: Yes, home use continued  Glucose within target range: Yes, in range    Does patient need/have an active type/screen? Yes    RASS at goal? No - adjusting sedation or order to reflect need  [place] Agitation Assessment Scale (RASS) : -1 (09/21/2021  4:00 AM)     Can  antipsychotics be stopped? N/A, not on antipsychotics  CAM-ICU Result: Positive (09/10/2021  8:00 PM)      Would hospice care be appropriate for this patient? No, patient improving or expected to improve  Any unaddressed hospice/palliative care needs? no    Patient Lines/Drains/Airways Status     Active Active Lines, Drains, & Airways     Name Placement date Placement time Site Days    Hemodialysis Catheter With Distal Infusion Port 09/08/21 Right Internal jugular 09/08/21  1600  Internal jugular  12    NG/OG Tube Feedings Left nostril 09/14/21  1500  Left nostril  6    Urethral Catheter Temperature probe 16 Fr. 08/22/2021  1602  Temperature probe  13    Arteriovenous Fistula - Vein Graft  Access 11/01/18 Right;Upper Arm 11/01/18  --  Arm  1055              Patient Lines/Drains/Airways Status     Active Wounds     Name Placement date Placement time Site Days    Surgical Site 09/17/21 Leg Right;Other (Comment) 09/17/21  1000  -- 3    Wound 09/09/21 Pressure Injury Sacrum Right;Mid dark red nonblanching with dark skin surrounding DTI 09/09/21  2130  Sacrum  11                Goals of Care     Code Status: Full Code    Designated Healthcare Decision Maker:  Mr. Mich current decisional capacity for healthcare decision-making is Full capacity. His designated healthcare decision maker(s) is/are   HCDM (patient stated preference): Rhee,Katherine - Spouse - [phone].    Subjective   No complaints     Objective     Vitals - past 24 hours  Temp:  [35.6 ??C (96.1 ??F)-37.5 ??C (99.5 ??F)] 37.5 ??C (99.5 ??F)  Heart Rate:  [65-121] 103  SpO2 Pulse:  [61-124] 114  Resp:  [8-24] 24  BP: (73-151)/(37-72) 143/69  SpO2:  [93 %-99 %] 93 % Intake/Output  I/O last 3 completed shifts:  In: 3345.3 [I.V.:40; Blood:388; NG/GT:2755; IV Piggyback:162.3]  Out: 1620 [Urine:720; Other:900]     Physical Exam:    General: thin elderly male, in NAD  HEENT: PERRL, slightly icteric sclera w edema  CV: RRR, no m/r/g, 2+ BLE edema   Pulm: diminished throughout, course crackles no wheezing  GI: abd round, non tender  MSK: grossly intact, tone intact  Skin: grossly intact, warm  Neuro: FSC, CN grossly intact, non focal motor, BUE 2/5, BLE 2/5       Continuous Infusions:       Scheduled Medications:   ??? aspirin  81 mg Enteral tube: gastric  Daily   ??? calcitRIOL  0.25 mcg Enteral tube: gastric  Q MWF   ??? cellulose, oxidized reg 1X 2 pad   Topical Once   ??? esomeprazole  40 mg Enteral tube: gastric  daily   ??? heparin (porcine)  5,000 Units Subcutaneous Q12H [facility]   ??? flu vacc qs2022-23 6mos up(PF)  0.5 mL Intramuscular During hospitalization   ??? insulin NPH  10 Units Subcutaneous Q12H [facility]   ??? insulin regular  0-20 Units Subcutaneous Q6H SCH   ??? levoFLOXacin  500 mg Intravenous Q48H   ??? levothyroxine  250 mcg Enteral tube: gastric  daily   ??? magnesium oxide  400 mg Enteral tube: gastric  BID   ??? predniSONE  10 mg Oral Daily   ??? [START ON 29-Sep-2021] predniSONE  5 mg Oral Daily   ???  sodium zirconium cyclosilicate  10 g Enteral tube: gastric  Daily   ??? Tacrolimus  1 mg Enteral tube: gastric  BID   ??? white petrolatum-mineral oil  1 application Both Eyes BID       PRN medications:  albumin human bottle 25 %, dextrose in water, heparin (porcine), heparin (porcine), ondansetron, sodium chloride    Data/Imaging Review: Reviewed in Epic and personally interpreted on 09/21/2021. See EMR for detailed results.       Toma Aran, PA

## 2021-09-22 LAB — CBC W/ AUTO DIFF
BASOPHILS ABSOLUTE COUNT: 0.1 10*9/L (ref 0.0–0.1)
BASOPHILS RELATIVE PERCENT: 0.6 %
EOSINOPHILS ABSOLUTE COUNT: 0.1 10*9/L (ref 0.0–0.5)
EOSINOPHILS RELATIVE PERCENT: 0.4 %
HEMATOCRIT: 24.1 % — ABNORMAL LOW (ref 39.0–48.0)
HEMOGLOBIN: 7.8 g/dL — ABNORMAL LOW (ref 12.9–16.5)
LYMPHOCYTES ABSOLUTE COUNT: 0.6 10*9/L — ABNORMAL LOW (ref 1.1–3.6)
LYMPHOCYTES RELATIVE PERCENT: 4.4 %
MEAN CORPUSCULAR HEMOGLOBIN CONC: 32.4 g/dL (ref 32.0–36.0)
MEAN CORPUSCULAR HEMOGLOBIN: 29.6 pg (ref 25.9–32.4)
MEAN CORPUSCULAR VOLUME: 91.2 fL (ref 77.6–95.7)
MEAN PLATELET VOLUME: 10.8 fL — ABNORMAL HIGH (ref 6.8–10.7)
MONOCYTES ABSOLUTE COUNT: 1.1 10*9/L — ABNORMAL HIGH (ref 0.3–0.8)
MONOCYTES RELATIVE PERCENT: 7.8 %
NEUTROPHILS ABSOLUTE COUNT: 12.7 10*9/L — ABNORMAL HIGH (ref 1.8–7.8)
NEUTROPHILS RELATIVE PERCENT: 86.8 %
PLATELET COUNT: 151 10*9/L (ref 150–450)
RED BLOOD CELL COUNT: 2.64 10*12/L — ABNORMAL LOW (ref 4.26–5.60)
RED CELL DISTRIBUTION WIDTH: 17.1 % — ABNORMAL HIGH (ref 12.2–15.2)
WBC ADJUSTED: 14.6 10*9/L — ABNORMAL HIGH (ref 3.6–11.2)

## 2021-09-22 LAB — BLOOD GAS CRITICAL CARE PANEL, VENOUS
BASE EXCESS VENOUS: 5.9 — ABNORMAL HIGH (ref -2.0–2.0)
BASE EXCESS VENOUS: 4.3 — ABNORMAL HIGH (ref -2.0–2.0)
BASE EXCESS VENOUS: 5.1 — ABNORMAL HIGH (ref -2.0–2.0)
BASE EXCESS VENOUS: 6.4 — ABNORMAL HIGH (ref -2.0–2.0)
CALCIUM IONIZED VENOUS (MG/DL): 5.13 mg/dL (ref 4.40–5.40)
CALCIUM IONIZED VENOUS (MG/DL): 4.93 mg/dL (ref 4.40–5.40)
CALCIUM IONIZED VENOUS (MG/DL): 5.14 mg/dL (ref 4.40–5.40)
CALCIUM IONIZED VENOUS (MG/DL): 5.13 mg/dL (ref 4.40–5.40)
GLUCOSE WHOLE BLOOD: 106 mg/dL (ref 70–179)
GLUCOSE WHOLE BLOOD: 123 mg/dL (ref 70–179)
GLUCOSE WHOLE BLOOD: 94 mg/dL (ref 70–179)
GLUCOSE WHOLE BLOOD: 66 mg/dL — ABNORMAL LOW (ref 70–179)
HCO3 VENOUS: 32 mmol/L — ABNORMAL HIGH (ref 22–27)
HCO3 VENOUS: 30 mmol/L — ABNORMAL HIGH (ref 22–27)
HCO3 VENOUS: 32 mmol/L — ABNORMAL HIGH (ref 22–27)
HCO3 VENOUS: 33 mmol/L — ABNORMAL HIGH (ref 22–27)
HEMOGLOBIN BLOOD GAS: 8.2 g/dL — ABNORMAL LOW (ref 13.50–17.50)
HEMOGLOBIN BLOOD GAS: 8 g/dL — ABNORMAL LOW
HEMOGLOBIN BLOOD GAS: 8.2 g/dL — ABNORMAL LOW
HEMOGLOBIN BLOOD GAS: 8.2 g/dL — ABNORMAL LOW
LACTATE BLOOD VENOUS: 1.2 mmol/L (ref 0.5–1.8)
LACTATE BLOOD VENOUS: 1.4 mmol/L (ref 0.5–1.8)
LACTATE BLOOD VENOUS: 1.1 mmol/L (ref 0.5–1.8)
LACTATE BLOOD VENOUS: 0.9 mmol/L (ref 0.5–1.8)
O2 SATURATION VENOUS: 80.9 % (ref 40.0–85.0)
O2 SATURATION VENOUS: 85.1 % — ABNORMAL HIGH (ref 40.0–85.0)
O2 SATURATION VENOUS: 87.8 % — ABNORMAL HIGH (ref 40.0–85.0)
O2 SATURATION VENOUS: 81.5 % (ref 40.0–85.0)
PCO2 VENOUS: 58 mmHg (ref 40–60)
PCO2 VENOUS: 61 mmHg — ABNORMAL HIGH (ref 40–60)
PCO2 VENOUS: 75 mmHg (ref 40–60)
PCO2 VENOUS: 73 mmHg (ref 40–60)
PH VENOUS: 7.35 (ref 7.32–7.43)
PH VENOUS: 7.31 — ABNORMAL LOW (ref 7.32–7.43)
PH VENOUS: 7.25 — ABNORMAL LOW (ref 7.32–7.43)
PH VENOUS: 7.28 — ABNORMAL LOW (ref 7.32–7.43)
PO2 VENOUS: 45 mmHg (ref 30–55)
PO2 VENOUS: 50 mmHg (ref 30–55)
PO2 VENOUS: 58 mmHg — ABNORMAL HIGH (ref 30–55)
PO2 VENOUS: 49 mmHg (ref 30–55)
POTASSIUM WHOLE BLOOD: 4.3 mmol/L (ref 3.4–4.6)
POTASSIUM WHOLE BLOOD: 4.9 mmol/L — ABNORMAL HIGH (ref 3.4–4.6)
POTASSIUM WHOLE BLOOD: 6.1 mmol/L (ref 3.4–4.6)
POTASSIUM WHOLE BLOOD: 4.9 mmol/L — ABNORMAL HIGH (ref 3.4–4.6)
SODIUM WHOLE BLOOD: 143 mmol/L (ref 135–145)
SODIUM WHOLE BLOOD: 140 mmol/L (ref 135–145)
SODIUM WHOLE BLOOD: 141 mmol/L (ref 135–145)
SODIUM WHOLE BLOOD: 141 mmol/L (ref 135–145)

## 2021-09-22 LAB — MAGNESIUM: MAGNESIUM: 2.4 mg/dL (ref 1.6–2.6)

## 2021-09-22 LAB — HEPATIC FUNCTION PANEL
ALBUMIN: 2 g/dL — ABNORMAL LOW (ref 3.4–5.0)
ALKALINE PHOSPHATASE: 400 U/L — ABNORMAL HIGH (ref 46–116)
ALT (SGPT): 182 U/L — ABNORMAL HIGH (ref 10–49)
AST (SGOT): 116 U/L — ABNORMAL HIGH (ref <=34)
BILIRUBIN DIRECT: 0.3 mg/dL (ref 0.00–0.30)
BILIRUBIN TOTAL: 0.5 mg/dL (ref 0.3–1.2)
PROTEIN TOTAL: 5.2 g/dL — ABNORMAL LOW (ref 5.7–8.2)

## 2021-09-22 LAB — BASIC METABOLIC PANEL
ANION GAP: 9 mmol/L (ref 5–14)
ANION GAP: 9 mmol/L (ref 5–14)
BLOOD UREA NITROGEN: 87 mg/dL — ABNORMAL HIGH (ref 9–23)
BLOOD UREA NITROGEN: 104 mg/dL — ABNORMAL HIGH (ref 9–23)
BUN / CREAT RATIO: 34
BUN / CREAT RATIO: 38
CALCIUM: 8.9 mg/dL (ref 8.7–10.4)
CALCIUM: 9 mg/dL (ref 8.7–10.4)
CHLORIDE: 101 mmol/L (ref 98–107)
CHLORIDE: 101 mmol/L (ref 98–107)
CO2: 30 mmol/L (ref 20.0–31.0)
CO2: 32 mmol/L — ABNORMAL HIGH (ref 20.0–31.0)
CREATININE: 2.57 mg/dL — ABNORMAL HIGH
CREATININE: 2.75 mg/dL — ABNORMAL HIGH
EGFR CKD-EPI (2021) MALE: 27 mL/min/{1.73_m2} — ABNORMAL LOW (ref >=60)
EGFR CKD-EPI (2021) MALE: 25 mL/min/{1.73_m2} — ABNORMAL LOW (ref >=60)
GLUCOSE RANDOM: 137 mg/dL (ref 70–179)
GLUCOSE RANDOM: 86 mg/dL (ref 70–179)
POTASSIUM: 4.5 mmol/L (ref 3.4–4.8)
POTASSIUM: 4.8 mmol/L (ref 3.4–4.8)
SODIUM: 140 mmol/L (ref 135–145)
SODIUM: 142 mmol/L (ref 135–145)

## 2021-09-22 LAB — PHOSPHORUS: PHOSPHORUS: 6.6 mg/dL — ABNORMAL HIGH (ref 2.4–5.1)

## 2021-09-22 LAB — TACROLIMUS LEVEL, TROUGH: TACROLIMUS, TROUGH: 1.7 ng/mL — ABNORMAL LOW (ref 5.0–15.0)

## 2021-09-22 MED ADMIN — levoFLOXacin (LEVAQUIN) 500 mg/100 mL IVPB 500 mg: 500 mg | INTRAVENOUS | @ 12:00:00 | Stop: 2021-09-26

## 2021-09-22 MED ADMIN — predniSONE (DELTASONE) tablet 10 mg: 10 mg | ORAL | @ 12:00:00 | Stop: 2021-09-22

## 2021-09-22 MED ADMIN — sodium zirconium cyclosilicate (LOKELMA) packet 10 g: 10 g | GASTROENTERAL | @ 12:00:00

## 2021-09-22 MED ADMIN — white petrolatum-mineral oil (SOOTHE PM) 80-20 % ophthalmic ointment 1 application: 1 | OPHTHALMIC | @ 01:00:00

## 2021-09-22 MED ADMIN — levothyroxine (SYNTHROID) tablet 250 mcg: 250 ug | GASTROENTERAL | @ 13:00:00

## 2021-09-22 MED ADMIN — tacrolimus (PROGRAF) oral suspension: 1 mg | GASTROENTERAL | @ 01:00:00

## 2021-09-22 MED ADMIN — calcitRIOL (ROCALTROL) oral solution: .25 ug | GASTROENTERAL | @ 12:00:00

## 2021-09-22 MED ADMIN — cellulose, oxidized reg 2"X 4" pad: TOPICAL | @ 09:00:00 | Stop: 2021-09-22

## 2021-09-22 MED ADMIN — norepinephrine 8 mg in dextrose 5 % 250 mL (32 mcg/mL) infusion PMB: 0-30 ug/min | INTRAVENOUS | @ 11:00:00

## 2021-09-22 MED ADMIN — tacrolimus (PROGRAF) oral suspension: 1 mg | GASTROENTERAL | @ 12:00:00 | Stop: 2021-09-22

## 2021-09-22 MED ADMIN — esomeprazole (NexIUM) granules 40 mg: 40 mg | GASTROENTERAL | @ 13:00:00

## 2021-09-22 MED ADMIN — magnesium oxide (MAG-OX) tablet 400 mg: 400 mg | GASTROENTERAL | @ 01:00:00

## 2021-09-22 MED ADMIN — white petrolatum-mineral oil (SOOTHE PM) 80-20 % ophthalmic ointment 1 application: 1 | OPHTHALMIC | @ 12:00:00

## 2021-09-22 NOTE — Unmapped (Signed)
ADVANCE CARE PLANNING NOTE    Discussion Date:  September 22, 2021    Patient has decisional capacity:  No    Patient has selected a Health Care Decision-Maker if loses capacity: No    Health Care Decision Maker as of 09/22/2021    HCDM (patient stated preference): Miguel Ward,Miguel Ward - Spouse - [phone]    Discussion Participants:  Miguel Ward   Miguel Ward    Communication of Medical Status/Prognosis:   Called to bedside by nurse given change in mental status no longer opening his eye to painful stimuli. VBG demonstrated acute hypercapnic respiratory failure, wife ask to not intubate patient and place him on ventilator and agreed to trial BIPAP while she called patient's daughters.    While on BiPAP improvement in CO2 retention and acidosis however no improvement in mental status. Wife arrived at the bedside and confirm his mental status of not responding to her voice when she enter the room unlike the previous day. Discussed likelihood of requiring a tracheostomy if got re-intubated given indication of worsening respiratory and wife state that is not what he would wanted.     Communication of Treatment Goals/Options:   Wife explained that the patient would not want to be sustained by machines and has seen on weak he has been since coming off the ventilator. Acknowledges his strength has not improved in terms of his ability to cough over the course of about a week. Has called daughters to come in.    Treatment Decisions:   - DNI  - not ready to discuss DNR, would like daughter to be present for that part of the discussion           I spent 20 minutes providing voluntary advance care planning services for this patient.

## 2021-09-22 NOTE — Unmapped (Signed)
Tacrolimus Therapeutic Monitoring Pharmacy Note    Miguel Ward is a 70 y.o. male continuing tacrolimus.     Indication: Kidney transplant     Date of Transplant: 06/11/2007      Prior Dosing Information: Home regimen 3 mg BID . Current regimen 1mg  bid    Goals:  Therapeutic Drug Levels  Tacrolimus trough goal: 2-4 mg/mL     Additional Clinical Monitoring/Outcomes  ?? Monitor renal function (SCr and urine output) and liver function (LFTs)  ?? Monitor for signs/symptoms of adverse events (e.g., hyperglycemia, hyperkalemia, hypomagnesemia, hypertension, headache, tremor)    Results:   Tacrolimus level:  10/1 tacro = 4.3ng/mL at 0609  10/2 tacro = 4.0ng/mL at 0545  10/3 tacro = 2.9ng/ml at 0743  10/4 tacro = 2.4ng/ml [id number]  10/6 tacro = 2.0 ng/ml @ 0812  10/10 tacro = 1.7 ng/mL @ 0825    Pharmacokinetic Considerations and Significant Drug Interactions:  ??? Concurrent hepatotoxic medications: APAP (q8h schedule) - Atorva:   BOTH DC'd 10/2)  ??? Concurrent CYP3A4 substrates/inhibitors: none  ??? Concurrent nephrotoxic medications: Lasix, acyclovir      Assessment/Plan:  Tacrolimus level SUBTHERAPEUTIC    Recommendedation(s)  INCREASE to 1mg  AM + 1.5mg  PM (solution via NG tube)    Follow-up  ??? Next tacrolimus trough on 09/25/21 at 0700  ??? pharmacist will continue to monitor and recommend levels as appropriate    Please page service pharmacist with questions/clarifications.    Charleen Kirks, PharmD

## 2021-09-22 NOTE — Unmapped (Signed)
CVAD Liaison - Bleeding CVAD Note    CVAD Liaison Nurse assessed the patient at the bed side. The patient has a(n) trialysis catheter in right IJ.     Bloody drainage was observed. Manual pressure was not held for 0 minutes. Surgicel dressing was applied aseptically. Please call/page CVAD Liaison for any issues with this dressing or questions. Report given to the primary RN.    Thank you for this consult,  Caren Hazy RN, CVAD Liaison    Consult Time  30 minutes (min)

## 2021-09-22 NOTE — Unmapped (Signed)
Transplant Nephrology Follow-Up Consult note    Assessment/Recommendations: Miguel Ward is a 70 y.o. male  status post deceased donor kidney transplant on 06/11/2007 (Kidney) for native kidney disease secondary to lupus nephritis who has been admitted with AKI/sepsis.  ??  # Kidney allograft function: (unstable)  - most likely etiology is ATN secondary to sepsis and hypotension  - not yet showing signs of definitive renal recovery  - no acute indications for dialysis today,   - plan for tomorrow if blood pressure will tolerate    # Immunosuppression [High risk medical decision making for drug therapy requiring intensive monitoring for toxicity]  - on tacrolimus 1 mg in the am, 1.5 mg in the pm  - tac level 1.7 today but given low likelihood of renal recovery and progressing pulmonary infiltrate, would not push the dose farther  ??  # Legionella pneumonia/sepsis  - unfortunately CXR worsening today  - plan per pulmonary    # Advanced care planning  - spoke with wife and multiple other family members today regarding poor prognosis which they understand well  ??    Recommendations were discussed with primary service.    Earnest Rosier Fardeen Steinberger  Division of Nephrology and Hypertension  [facility]  09/22/2021    ___________________________________________________________    Subjective: Was on room air yesterday but found to be retaining CO2 and placed on BiPAP. After conversation with the family this morning, made DNI. Unable to wean off pressors.     Medications:   Current Facility-Administered Medications   Medication Dose Route Frequency Provider Last Rate Last Admin   ??? albumin human 25 % bottle 25 g  25 g Intravenous Each time in dialysis PRN Griffin Dakin, MD   25 g at 09/18/21 1042   ??? calcitRIOL (ROCALTROL) oral solution  0.25 mcg Enteral tube: gastric  Q MWF Zannie Cove, MD   0.25 mcg at 09/22/21 0827   ??? dextrose (D10W) 10% bolus 125 mL  12.5 g Intravenous Q10 Min PRN Amy Ladon Applebaum, [place] ??? esomeprazole (NexIUM) granules 40 mg  40 mg Enteral tube: gastric  daily Amy Ladon Applebaum, PA   40 mg at 09/22/21 1610   ??? heparin (porcine) 1000 unit/mL injection 2,000 Units  2 mL Intra-cannular Each time in dialysis PRN Elmer Picker, MD   1,400 Units at 09/16/21 1110   ??? heparin (porcine) 1000 unit/mL injection 2,000 Units  2 mL Intra-cannular Each time in dialysis PRN Elmer Picker, MD   1,400 Units at 09/16/21 1105   ??? influenza vaccine quad (FLUARIX, FLULAVAL, FLUZONE) (6 MOS & UP) 2022-23  0.5 mL Intramuscular During hospitalization Rayetta Pigg, MD       ??? insulin NPH (HumuLIN,NovoLIN) injection 5 Units  5 Units Subcutaneous Q12H [facility] Honey Monet Gildardo Cranker, ACNP       ??? insulin regular (HumuLIN,NovoLIN) injection 0-20 Units  0-20 Units Subcutaneous Q6H SCH Amy Ross Vota, [place]       ??? levoFLOXacin (LEVAQUIN) 500 mg/100 mL IVPB 500 mg  500 mg Intravenous Q48H Amy Ross Vota, [place]   Stopped at 09/22/21 9604   ??? levothyroxine (SYNTHROID) tablet 250 mcg  250 mcg Enteral tube: gastric  daily Honey Lynnae January, ACNP   250 mcg at 09/22/21 5409   ??? norepinephrine 8 mg in dextrose 5 % 250 mL (32 mcg/mL) infusion PMB  0-30 mcg/min Intravenous Continuous Qurratul-Ayn Dibble, PA 7.5 mL/hr at 09/22/21 1400 4 mcg/min at 09/22/21 1400   ???  ondansetron (ZOFRAN) injection 4 mg  4 mg Intravenous Q6H PRN Amy Ladon Applebaum, PA       ??? [START ON 10-05-2021] predniSONE (DELTASONE) tablet 5 mg  5 mg Oral Daily Amy Ross Vota, PA       ??? sodium chloride 3 % nebulizer solution 4 mL  4 mL Nebulization Q6H PRN Amy Ladon Applebaum, PA       ??? sodium zirconium cyclosilicate (LOKELMA) packet 10 g  10 g Enteral tube: gastric  Daily Amy Ross Vota, PA   10 g at 09/22/21 0827   ??? [START ON 2021/10/05] tacrolimus (PROGRAF) oral suspension  1 mg Enteral tube: gastric  Daily Honey Monet Gildardo Cranker, ACNP       ??? tacrolimus (PROGRAF) oral suspension  1.5 mg Enteral tube: gastric  Nightly (2000) Honey Monet Gildardo Cranker, ACNP       ??? white petrolatum-mineral oil (SOOTHE PM) 80-20 % ophthalmic ointment 1 application  1 application Both Eyes BID Honey Monet Gildardo Cranker, ACNP   1 application at 09/22/21 0827      Physical Exam:   Vitals:    09/22/21 1401   BP: 123/53   Pulse: 81   Resp: 16   Temp:    SpO2: 98%     I/O this shift:  In: 323.5 [I.V.:53.5; NG/GT:170; IV Piggyback:100]  Out: -     Intake/Output Summary (Last 24 hours) at 09/22/2021 1452  Last data filed at 09/22/2021 1400  Gross per 24 hour   Intake 1473.51 ml   Output 375 ml   Net 1098.51 ml     General: occasionally opens eyes to voice, not really answering questions  HEENT: BiPAP in place  CV: regular rate and rhythm  Lungs: BS decreased on the right, coarse thoughout  Neuro: somnolent    Test Results  Reviewed  Lab Results   Component Value Date    NA 140 09/22/2021    K 4.9 (H) 09/22/2021    CL 101 09/22/2021    CO2 30.0 09/22/2021    BUN 87 (H) 09/22/2021    CREATININE 2.57 (H) 09/22/2021    GFR 44.94 (L) 02/28/2013    GLU 137 09/22/2021    CALCIUM 8.9 09/22/2021    ALBUMIN 2.0 (L) 09/22/2021    PHOS 6.6 (H) 09/22/2021

## 2021-09-22 NOTE — Unmapped (Signed)
MICU Evening Summary     Date of Service: 09/21/2021    Interval History: Miguel Ward is a 70 y.o. male with HTN, renal transplant 2009, HFrEF, NSTEMI, and lupus presented with acute hypoxic respiratory failure and COVID-19 positive then improving no longer has critical care needs.      Assessment & Plan     Neuro: following commands  Pulm: RA, continues to requiring frequent suctioning  CV: stable hemodynamically    Renal: iHD last 10/8  GI: noted 2 medium size dark tarry stool at the end of day shift, continue to monitor   Heme: given stools recheck hbg 6.9 from 7.0 this morning giving 1U PRBC now with post transfusion hbg for response. Held [facility] for now  Endo: continues on with NPH  ID: continue levaquin for legionnaires, continue prednisone taper back to home 5mg  daily, tacro, and holding cellcept     Dispo: stepdown status    Toma Aran, PA

## 2021-09-22 NOTE — Unmapped (Signed)
Temps been running on the lower end ~35.3C. Neuro status increasingly more drowsy and unresponsive to pain, blood gases obtained and placed on bipap. NG placed to LIWS before placing on bipap. NT suctioned 3-4 times this shift for heavy secretions. Levo started for low Bps. 1 unit PRBC's given. UO diminished, 1 dark BM. Turned q2. Spouse at bedside.  Problem: Fall Injury Risk  Goal: Absence of Fall and Fall-Related Injury  Outcome: Progressing  Intervention: Promote Injury-Free Environment  Recent Flowsheet Documentation  Taken 09/21/2021 2000 by Max Fickle, RN  Safety Interventions:   aspiration precautions   low bed   lighting adjusted for tasks/safety     Problem: Infection  Goal: Absence of Infection Signs and Symptoms  Outcome: Progressing  Intervention: Prevent or Manage Infection  Recent Flowsheet Documentation  Taken 09/21/2021 2000 by Max Fickle, RN  Infection Management: aseptic technique maintained     Problem: Adult Inpatient Plan of Care  Goal: Plan of Care Review  Outcome: Progressing  Goal: Patient-Specific Goal (Individualized)  Outcome: Progressing  Goal: Absence of Hospital-Acquired Illness or Injury  Outcome: Progressing  Intervention: Identify and Manage Fall Risk  Recent Flowsheet Documentation  Taken 09/21/2021 2000 by Max Fickle, RN  Safety Interventions:   aspiration precautions   low bed   lighting adjusted for tasks/safety  Intervention: Prevent Skin Injury  Recent Flowsheet Documentation  Taken 09/21/2021 2000 by Max Fickle, RN  Skin Protection:   adhesive use limited   skin-to-device areas padded   skin-to-skin areas padded  Intervention: Prevent and Manage VTE (Venous Thromboembolism) Risk  Recent Flowsheet Documentation  Taken 09/21/2021 2000 by Max Fickle, RN  Activity Management: bedrest  Intervention: Prevent Infection  Recent Flowsheet Documentation  Taken 09/21/2021 2000 by Max Fickle, RN  Infection Prevention: hand hygiene promoted  Goal: Optimal Comfort and Wellbeing  Outcome: Progressing  Goal: Readiness for Transition of Care  Outcome: Progressing  Goal: Rounds/Family Conference  Outcome: Progressing     Problem: Skin Injury Risk Increased  Goal: Skin Health and Integrity  Outcome: Progressing  Intervention: Optimize Skin Protection  Recent Flowsheet Documentation  Taken 09/21/2021 2000 by Max Fickle, RN  Pressure Reduction Techniques:   frequent weight shift encouraged   weight shift assistance provided   sit time limited to 2 hours  Head of Bed (HOB) Positioning: HOB at 30-45 degrees  Pressure Reduction Devices:   positioning supports utilized   pressure-redistributing mattress utilized  Skin Protection:   adhesive use limited   skin-to-device areas padded   skin-to-skin areas padded     Problem: Dysrhythmia (Heart Failure)  Goal: Stable Heart Rate and Rhythm  Outcome: Progressing     Problem: Fluid Imbalance (Heart Failure)  Goal: Fluid Balance  Outcome: Progressing     Problem: Respiratory Compromise (Heart Failure)  Goal: Effective Oxygenation and Ventilation  Outcome: Progressing     Problem: Device-Related Complication Risk (Mechanical Ventilation, Invasive)  Goal: Optimal Device Function  Intervention: Optimize Device Care and Function  Recent Flowsheet Documentation  Taken 09/21/2021 2000 by Max Fickle, RN  Aspiration Precautions:   respiratory status monitored   oral hygiene care promoted     Problem: Impaired Wound Healing  Goal: Optimal Wound Healing  Outcome: Progressing  Intervention: Promote Wound Healing  Recent Flowsheet Documentation  Taken 09/21/2021 2000 by Max Fickle, RN  Activity Management: bedrest     Problem: Self-Care Deficit  Goal: Improved Ability to Complete Activities of Daily Living  Outcome:  Progressing     Problem: Diabetes Comorbidity  Goal: Blood Glucose Level Within Targeted Range  Outcome: Progressing     Problem: Heart Failure Comorbidity  Goal: Maintenance of Heart Failure Symptom Control  Outcome: Progressing Problem: Hypertension Comorbidity  Goal: Blood Pressure in Desired Range  Outcome: Progressing     Problem: Device-Related Complication Risk (Hemodialysis)  Goal: Safe, Effective Therapy Delivery  Outcome: Progressing     Problem: Hemodynamic Instability (Hemodialysis)  Goal: Effective Tissue Perfusion  Outcome: Progressing     Problem: Infection (Hemodialysis)  Goal: Absence of Infection Signs and Symptoms  Outcome: Progressing

## 2021-09-22 NOTE — Unmapped (Signed)
MICU Daily Progress Note     Date of Service: 09/22/2021    Problem List:   Principal Problem (Resolved):    Acute respiratory distress syndrome (ARDS) due to COVID-19 virus (CMS-HCC)  Active Problems:    Hypercholesterolemia    Hypertension    History of kidney transplant    Renal cell carcinoma (CMS-HCC)    Immunosuppression-related infectious disease (CMS-HCC)    Bilateral lower extremity edema    Elevated troponin I level    GERD (gastroesophageal reflux disease)    Diastolic heart failure (CMS-HCC)    Acute on chronic HFrEF (heart failure with reduced ejection fraction) (CMS-HCC)    COVID    ATN (acute tubular necrosis) (CMS-HCC)    Acute renal failure with tubular necrosis (CMS-HCC)      HPI: Miguel Ward is a 70 y.o. male renal transplant from lupus nephritis (complex course over last 15 years since transplant followed here including renal cell) here with acute hypoxic respiratory failure, COVID 19 and shock with AKI.    24hr events:  - mentation unchanged  - hypercapnea worse req bipap  - GOC discussion >> DNI    Neurological   #acute metabolic encephalopathy  - analegsia prn  - CT head 9/29 no acute findings  - neuro exam improving >> sustained wakefulness and interaction, non focal. Remains intermittently drowsy and weak   - more lethargic and encephalopthic r/t hypercapnea and kidney failure    ? Critical illness polyneuropathy:  - cont pt/ot/speech when approp    Pulmonary   #ARDS, Acute resp failure, resolving   - intubated 9/25 on minimal settings, passing SBTs yet precluded extubation for mentation  - bronched 9/29 secretions blood tinged in trach and mainstem airways, none seen in distal airways  - passed SBT w improved wakefulness and ability to clear cough, extubated to [place] on 10/2  - tolerating RA well, but with rhonchi and weak cough. cont chest PT/nebs / NT suctioning   - freq suctioning NTS for inability to clear secretions well likely contributing to hypercapnea  - cxr w worse airspace dz Rt chest opacified     Cardiovascular   #septic shock, elevated trop  #HFrEF, ho htn, hld, cad  - formal echo 9/26 EF @ 30%, 2020 ECHO 40-45%  - cont home regimen asa (holding statin due to elevated lfts) >> holding asa d/t downtrend hgb and transfusions  - hold home regimen metop, vasotec, lasix  - SDS 9/26--> 10/1  - levo for MAP>65, off since 10/2 >> resumed on 10/10 in setting of acidosis    Renal   #AKI (baseline Cr 2.7-3), AGMA likely ATN 2/2 sepsis and hypotension  #hx renal transplant s/t lupus nephritis, c/b rejection and renal cell CA s/p cryo ablation  - worsening renal fxn and CRRT started 9/26--> 10/4.   - continue tac with goal of 2-4   - prednisone 15 mg , but will lower to 10 mg 10/6 x 5 days and then to home dose of 5mg    - continue to hold cellcept  - cont magox and calcitriol  - replete electrolytes prn  -  iHD 10/8 - tolerated well. removed 900 , next pending in am  - cont lokelma, f/u bmp    Infectious Disease/Autoimmune   #COVID 19 infection  Symptom onset:??9/22  Exposure: unknown  COVID PCR+: neg x 3  (home test positive 9/23?)  Day of admission/transfer: 9/25  COVID Specific??Meds:??none given   Vaccination status: full & boosted x once, evushield  given 8/17  3 PCRs since admission negative and taken off precautions   ??  9/25 Bcx neg   9/25 Ucx neg UA 3 WBC  9/25 Lower resp cx, OPF  9/25 SARS-CoV-2 negative for NP and trach aspirate  9/25 CrAg negative  9/26 RPP negative  9/26 Legionella Ag (urine) positive  9/26 Legionella PCR positive  9/26 CMV PCR negative  ??  Multifocal PNA:   - on vanco/cefepime on 9/25, stopped 10/3 per ICID  - MRSA screen neg and vanco stopped 9/27, restarted 9/29 in the setting of worsening pressor requirements and GPC seen on BAL from [place] on 9/29  - CT Chest 9/29 c/w volume overload, pna and rt pleural effusion    Legionella, PNA   - levofloxacin started 9/26 was stopped at 10 day and resumed to finish 10/12   ??  Cultures:  Blood Culture, Routine (no units) Date Value   09/11/2021 No Growth at 5 days     Urine Culture, Comprehensive (no units)   Date Value   09/30/21 NO GROWTH     Lower Respiratory Culture (no units)   Date Value   09/11/2021 Specimen Not Processed   09/30/21 OROPHARYNGEAL FLORA ISOLATED     WBC (10*9/L)   Date Value   09/22/2021 14.6 (H)     WBC, UA (/HPF)   Date Value   2021/09/30 <1        FEN/GI   #gerd  - TF tol well at goal + dysphagia 6 diet   - ppi for gi ppx  - bowel regimen held for loose stools    Transaminitis and Elevated bilirubin (improving): overall all uptrending, [place] RUQ didn't demonstrated acute cholecystitis.  - continue to trend LFTs  - potential need for HIDA if continues to increase holding off given bilirubin downtrending   - [place] negative.  Also stopped statin and abx in the past 24hrs so will monitor -->  Fluctuating, CTM     Malnutrition Assessment: Not done yet.  Body mass index is 22.68 kg/m??.        Heme/Coag   Rt groin bleeding s/p line removal, resolved  - was on heparin gtt for NSTEMI and afib- stopped 9/27  - continue SQH for DVT ppx  - HH, plts, coags acceptable  - SCDs for DVT ppx  - last transfused 10/8.  Still oozing from old line sites and insulin injection sites.  DIC panel ok. -   - dose of DDAVP 10/8 >> hemostasis    GIB  - noted to have tarry stools,   - cont ppi, holding asa as above  - ongoing GOC discussions re escalation of care    Endocrine   #hypoglycemic, hypothryoid  - cont ADI + accu checks , NPH 25 mg bid ==> endotool 10/5 --> decreased to NPH 10 bid   - hold home regimen allopurinol  - cont home regimen synthroid    Integumentary   #NAI  - WOCN consulted for high risk skin assessment No. Reason: no alterations in skin.  - WOCN recs >> agree with assmt and plan 9/30  - cont pressure mitigating precautions per skin policy    Prophylaxis/LDA/Restraints/Consults   Can CVC be removed? No: dialysis catheter   Can A-line be removed?n/a   Can Foley be removed? No: Need continuous I/O  Mobility plan: Step 2 - Head of bed elevation (>60 degrees)    Feeding: Tube feeds at goal  Analgesia: Pain adequately controlled  Sedation SAT/SBT: N/A  Thromboembolic ppx: SQ heparin  Head of bed >30 degrees: Yes  Ulcer ppx: Yes, home use continued  Glucose within target range: Yes, in range    Does patient need/have an active type/screen? Yes    RASS at goal? No - adjusting sedation or order to reflect need  [place] Agitation Assessment Scale (RASS) : -3 (09/22/2021  8:00 AM)     Can antipsychotics be stopped? N/A, not on antipsychotics  CAM-ICU Result: Positive (09/10/2021  8:00 PM)      Would hospice care be appropriate for this patient? No, patient improving or expected to improve  Any unaddressed hospice/palliative care needs? no    Patient Lines/Drains/Airways Status     Active Active Lines, Drains, & Airways     Name Placement date Placement time Site Days    Hemodialysis Catheter With Distal Infusion Port 09/08/21 Right Internal jugular 09/08/21  1600  Internal jugular  13    NG/OG Tube Feedings Left nostril 09/14/21  1500  Left nostril  7    Urethral Catheter Temperature probe 16 Fr. Sep 19, 2021  1602  Temperature probe  14    Peripheral IV 09/22/21 Left Hand 09/22/21  0900  Hand  less than 1    Arteriovenous Fistula - Vein Graft  Access 11/01/18 Right;Upper Arm 11/01/18  --  Arm  1056              Patient Lines/Drains/Airways Status     Active Wounds     Name Placement date Placement time Site Days    Surgical Site 09/17/21 Leg Right;Other (Comment) 09/17/21  1000  -- 5    Wound 09/09/21 Pressure Injury Sacrum Right;Mid dark red nonblanching with dark skin surrounding DTI 09/09/21  2130  Sacrum  12                Goals of Care     Code Status: Full Code    Designated Healthcare Decision Maker:  Mr. Kowalke current decisional capacity for healthcare decision-making is Full capacity. His designated healthcare decision maker(s) is/are   HCDM (patient stated preference): [place],Katherine - Spouse - [phone].    Subjective   No complaints     Objective     Vitals - past 24 hours  Temp:  [35.2 ??C (95.4 ??F)-37.4 ??C (99.3 ??F)] 36.9 ??C (98.4 ??F)  Heart Rate:  [44-110] 80  SpO2 Pulse:  [57-120] 83  Resp:  [13-31] 16  BP: (88-174)/(37-76) 119/37  FiO2 (%):  [30 %-40 %] 30 %  SpO2:  [86 %-100 %] 96 % Intake/Output  I/O last 3 completed shifts:  In: 2670 [Blood:240; NG/GT:2430]  Out: 445 [Urine:445]     Physical Exam:    General: thin elderly male, in NAD, obtunded  HEENT: PERRL, slightly icteric sclera w edema  CV: RRR, no m/r/g, 2+ BLE edema   Pulm: diminished throughout, course crackles no wheezing, dec bases  GI: abd round, non tender  MSK: grossly intact, tone intact  Skin: grossly intact, warm  Neuro: GCS [id number], non focal motor, flicker flexion x 4       Continuous Infusions:   ??? norepinephrine bitartrate-NS 6 mcg/min (09/22/21 1115)       Scheduled Medications:   ??? calcitRIOL  0.25 mcg Enteral tube: gastric  Q MWF   ??? esomeprazole  40 mg Enteral tube: gastric  daily   ??? flu vacc qs2022-23 6mos up(PF)  0.5 mL Intramuscular During hospitalization   ??? insulin NPH  5 Units Subcutaneous Q12H [facility]   ???  insulin regular  0-20 Units Subcutaneous Q6H [facility]   ??? levoFLOXacin  500 mg Intravenous Q48H   ??? levothyroxine  250 mcg Enteral tube: gastric  daily   ??? [START ON 2021/10/09] predniSONE  5 mg Oral Daily   ??? sodium zirconium cyclosilicate  10 g Enteral tube: gastric  Daily   ??? Tacrolimus  1 mg Enteral tube: gastric  BID   ??? white petrolatum-mineral oil  1 application Both Eyes BID       PRN medications:  albumin human bottle 25 %, dextrose in water, heparin (porcine), heparin (porcine), ondansetron, sodium chloride    Data/Imaging Review: Reviewed in Epic and personally interpreted on 09/22/2021. See EMR for detailed results.        Critical Care Attestation     This patient is critically ill or injured with the impairment of vital organ systems such that there is a high probability of imminent or life threatening deterioration in the patient's condition. This patient must remain in the ICU for ongoing evaluation of the comprehensive management plan outlined in this note. I directly provided critical care services as documented in this note and the critical care time spent (55  min) is exclusive of separately billable procedures.  In addition to time spent for critical care management, I also provided advance care planning services for 20  minutes (see ACP note for details)???. Total billable critical care time 75  minutes.       Kaizer Dissinger Fonnie Mu, ACNP

## 2021-09-22 NOTE — Unmapped (Signed)
MICU Advanced Care Planning Note       Discussion Date:  09/22/2021    Patient has decisional Capacity:  No    Living Will:  No    Surrogate Decision Maker:  Yes    Health Care Power of Attorney:  No    Discussion Participants:  Spouse, daughter    Communication of Medical Status/Prognosis, Treatment Goals/Options:   Called to bedside by family to discuss GOC and plan of care. Spouse concerned about pt suffering and asked about comfort care vs supportive care. We discussed both options and she and her daughter were both adamant that they would not want him to have cardiac resusc in the event of cardiac arrest nor would they want him to be intubated in the event of resp arrest or worsening resp status. I explained that his mental status is largely related to his hypercapnea which was already being influenced by his inability to clear secretions, Reiterated that we would continue to provide supportive care w airway clearance, pressors and dialysis whiel family discussed option of comfort care. They are both concerned that he has endured long suffering leading up to this day and are apprehensive about continuing care further.     Treatment Decisions:   - maintain current level of support  - revisit comfort care  - DNR DNI      Attestation     I discussed advanced care planning with participants noted above for which they wanted to participate in this voluntary service. I personally performed this counseling service and spent over 20 providing advanced care planning services for this patient.     Nylen Creque Fonnie Mu, ACNP

## 2021-09-22 NOTE — Unmapped (Signed)
7a shift summary:  Q2 hour turns/skin care completed and documented. Continues on tube feedings as ordered. Having black stools - team aware. Patient remained free from falls/injury. Monitoring VS, remained afebrile. Bed low and locked. No complaints expressed at this time, will continue to monitor.      Problem: Fall Injury Risk  Goal: Absence of Fall and Fall-Related Injury  09/21/2021 1818 by Geanie Berlin, RN  Outcome: Ongoing - Unchanged  09/21/2021 1818 by Geanie Berlin, RN  Outcome: Ongoing - Unchanged  Intervention: Promote Injury-Free Environment  Recent Flowsheet Documentation  Taken 09/21/2021 0800 by Geanie Berlin, RN  Safety Interventions:   aspiration precautions   enteral feeding safety   fall reduction program maintained   infection management   lighting adjusted for tasks/safety   low bed   nonskid shoes/slippers when out of bed     Problem: Infection  Goal: Absence of Infection Signs and Symptoms  09/21/2021 1818 by Geanie Berlin, RN  Outcome: Ongoing - Unchanged  09/21/2021 1818 by Geanie Berlin, RN  Outcome: Ongoing - Unchanged     Problem: Adult Inpatient Plan of Care  Goal: Plan of Care Review  09/21/2021 1818 by Geanie Berlin, RN  Outcome: Ongoing - Unchanged  09/21/2021 1818 by Geanie Berlin, RN  Outcome: Ongoing - Unchanged  Goal: Patient-Specific Goal (Individualized)  09/21/2021 1818 by Geanie Berlin, RN  Outcome: Ongoing - Unchanged  09/21/2021 1818 by Geanie Berlin, RN  Outcome: Ongoing - Unchanged  Goal: Absence of Hospital-Acquired Illness or Injury  09/21/2021 1818 by Geanie Berlin, RN  Outcome: Ongoing - Unchanged  09/21/2021 1818 by Geanie Berlin, RN  Outcome: Ongoing - Unchanged  Intervention: Identify and Manage Fall Risk  Recent Flowsheet Documentation  Taken 09/21/2021 0800 by Geanie Berlin, RN  Safety Interventions:   aspiration precautions   enteral feeding safety   fall reduction program maintained   infection management   lighting adjusted for tasks/safety   low bed   nonskid shoes/slippers when out of bed  Intervention: Prevent and Manage VTE (Venous Thromboembolism) Risk  Recent Flowsheet Documentation  Taken 09/21/2021 0800 by Geanie Berlin, RN  Activity Management:   bedrest   activity adjusted per tolerance  Goal: Optimal Comfort and Wellbeing  09/21/2021 1818 by Geanie Berlin, RN  Outcome: Ongoing - Unchanged  09/21/2021 1818 by Geanie Berlin, RN  Outcome: Ongoing - Unchanged  Goal: Readiness for Transition of Care  09/21/2021 1818 by Geanie Berlin, RN  Outcome: Ongoing - Unchanged  09/21/2021 1818 by Geanie Berlin, RN  Outcome: Ongoing - Unchanged  Goal: Rounds/Family Conference  09/21/2021 1818 by Geanie Berlin, RN  Outcome: Ongoing - Unchanged  09/21/2021 1818 by Geanie Berlin, RN  Outcome: Ongoing - Unchanged     Problem: Skin Injury Risk Increased  Goal: Skin Health and Integrity  09/21/2021 1818 by Geanie Berlin, RN  Outcome: Ongoing - Unchanged  09/21/2021 1818 by Geanie Berlin, RN  Outcome: Ongoing - Unchanged  Intervention: Optimize Skin Protection  Recent Flowsheet Documentation  Taken 09/21/2021 1800 by Geanie Berlin, RN  Pressure Reduction Techniques:   frequent weight shift encouraged   heels elevated off bed  Taken 09/21/2021 1600 by Geanie Berlin, RN  Pressure Reduction Techniques:   frequent weight shift encouraged   heels elevated off bed  Head of Bed (HOB) Positioning: HOB at 30-45 degrees  Taken 09/21/2021 1400 by Lelon Mast L  Grayer Sproles, RN  Pressure Reduction Techniques:   frequent weight shift encouraged   heels elevated off bed  Taken 09/21/2021 1200 by Geanie Berlin, RN  Pressure Reduction Techniques:   frequent weight shift encouraged   heels elevated off bed  Head of Bed [facility]) Positioning: HOB at 30-45 degrees  Taken 09/21/2021 1000 by Geanie Berlin, RN  Pressure Reduction Techniques:   frequent weight shift encouraged   heels elevated off bed  Taken 09/21/2021 0800 by Geanie Berlin, RN  Pressure Reduction Techniques:   frequent weight shift encouraged   heels elevated off bed  Head of Bed (HOB) Positioning: HOB at 30-45 degrees     Problem: Dysrhythmia (Heart Failure)  Goal: Stable Heart Rate and Rhythm  09/21/2021 1818 by Geanie Berlin, RN  Outcome: Ongoing - Unchanged  09/21/2021 1818 by Geanie Berlin, RN  Outcome: Ongoing - Unchanged     Problem: Fluid Imbalance (Heart Failure)  Goal: Fluid Balance  09/21/2021 1818 by Geanie Berlin, RN  Outcome: Ongoing - Unchanged  09/21/2021 1818 by Geanie Berlin, RN  Outcome: Ongoing - Unchanged     Problem: Respiratory Compromise (Heart Failure)  Goal: Effective Oxygenation and Ventilation  09/21/2021 1818 by Geanie Berlin, RN  Outcome: Ongoing - Unchanged  09/21/2021 1818 by Geanie Berlin, RN  Outcome: Ongoing - Unchanged     Problem: Self-Care Deficit  Goal: Improved Ability to Complete Activities of Daily Living  09/21/2021 1818 by Geanie Berlin, RN  Outcome: Ongoing - Unchanged  09/21/2021 1818 by Geanie Berlin, RN  Outcome: Ongoing - Unchanged     Problem: Impaired Wound Healing  Goal: Optimal Wound Healing  09/21/2021 1818 by Geanie Berlin, RN  Outcome: Ongoing - Unchanged  09/21/2021 1818 by Geanie Berlin, RN  Outcome: Ongoing - Unchanged  Intervention: Promote Wound Healing  Recent Flowsheet Documentation  Taken 09/21/2021 0800 by Geanie Berlin, RN  Activity Management:   bedrest   activity adjusted per tolerance     Problem: Diabetes Comorbidity  Goal: Blood Glucose Level Within Targeted Range  09/21/2021 1818 by Geanie Berlin, RN  Outcome: Ongoing - Unchanged  09/21/2021 1818 by Geanie Berlin, RN  Outcome: Ongoing - Unchanged     Problem: Heart Failure Comorbidity  Goal: Maintenance of Heart Failure Symptom Control  09/21/2021 1818 by Geanie Berlin, RN  Outcome: Ongoing - Unchanged  09/21/2021 1818 by Geanie Berlin, RN  Outcome: Ongoing - Unchanged     Problem: Hypertension Comorbidity  Goal: Blood Pressure in Desired Range  09/21/2021 1818 by Geanie Berlin, RN  Outcome: Ongoing - Unchanged  09/21/2021 1818 by Geanie Berlin, RN  Outcome: Ongoing - Unchanged     Problem: Device-Related Complication Risk (Hemodialysis)  Goal: Safe, Effective Therapy Delivery  09/21/2021 1818 by Geanie Berlin, RN  Outcome: Ongoing - Unchanged  09/21/2021 1818 by Geanie Berlin, RN  Outcome: Ongoing - Unchanged     Problem: Hemodynamic Instability (Hemodialysis)  Goal: Effective Tissue Perfusion  09/21/2021 1818 by Geanie Berlin, RN  Outcome: Ongoing - Unchanged  09/21/2021 1818 by Geanie Berlin, RN  Outcome: Ongoing - Unchanged     Problem: Infection (Hemodialysis)  Goal: Absence of Infection Signs and Symptoms  09/21/2021 1818 by Geanie Berlin, RN  Outcome: Ongoing - Unchanged  09/21/2021 1818 by Geanie Berlin, RN  Outcome: Ongoing - Unchanged

## 2021-09-23 DIAGNOSIS — R6521 Severe sepsis with septic shock: Secondary | ICD-10-CM | POA: Diagnosis not present

## 2021-09-23 DIAGNOSIS — N170 Acute kidney failure with tubular necrosis: Secondary | ICD-10-CM | POA: Diagnosis not present

## 2021-09-23 DIAGNOSIS — A419 Sepsis, unspecified organism: Secondary | ICD-10-CM | POA: Diagnosis not present

## 2021-09-23 DIAGNOSIS — U071 COVID-19: Secondary | ICD-10-CM | POA: Diagnosis not present

## 2021-09-23 DIAGNOSIS — J9601 Acute respiratory failure with hypoxia: Secondary | ICD-10-CM | POA: Diagnosis not present

## 2021-09-23 DIAGNOSIS — R579 Shock, unspecified: Secondary | ICD-10-CM | POA: Diagnosis not present

## 2021-09-23 LAB — BLOOD GAS CRITICAL CARE PANEL, VENOUS
BASE EXCESS VENOUS: 6.5 — ABNORMAL HIGH (ref -2.0–2.0)
BASE EXCESS VENOUS: 3 — ABNORMAL HIGH (ref -2.0–2.0)
BASE EXCESS VENOUS: 5.2 — ABNORMAL HIGH (ref -2.0–2.0)
CALCIUM IONIZED VENOUS (MG/DL): 4.97 mg/dL (ref 4.40–5.40)
CALCIUM IONIZED VENOUS (MG/DL): 4.96 mg/dL (ref 4.40–5.40)
CALCIUM IONIZED VENOUS (MG/DL): 4.85 mg/dL (ref 4.40–5.40)
GLUCOSE WHOLE BLOOD: 67 mg/dL — ABNORMAL LOW (ref 70–179)
GLUCOSE WHOLE BLOOD: 111 mg/dL (ref 70–179)
GLUCOSE WHOLE BLOOD: 133 mg/dL (ref 70–179)
HCO3 VENOUS: 32 mmol/L — ABNORMAL HIGH (ref 22–27)
HCO3 VENOUS: 29 mmol/L — ABNORMAL HIGH (ref 22–27)
HCO3 VENOUS: 31 mmol/L — ABNORMAL HIGH (ref 22–27)
HEMOGLOBIN BLOOD GAS: 7.7 g/dL — ABNORMAL LOW
HEMOGLOBIN BLOOD GAS: 8 g/dL — ABNORMAL LOW
HEMOGLOBIN BLOOD GAS: 7.3 g/dL — ABNORMAL LOW
LACTATE BLOOD VENOUS: 0.8 mmol/L (ref 0.5–1.8)
LACTATE BLOOD VENOUS: 0.8 mmol/L (ref 0.5–1.8)
LACTATE BLOOD VENOUS: 1.4 mmol/L (ref 0.5–1.8)
O2 SATURATION VENOUS: 75.2 % (ref 40.0–85.0)
O2 SATURATION VENOUS: 97.2 % — ABNORMAL HIGH (ref 40.0–85.0)
O2 SATURATION VENOUS: 47.4 % (ref 40.0–85.0)
PCO2 VENOUS: 57 mmHg (ref 40–60)
PCO2 VENOUS: 68 mmHg (ref 40–60)
PCO2 VENOUS: 62 mmHg — ABNORMAL HIGH (ref 40–60)
PH VENOUS: 7.36 (ref 7.32–7.43)
PH VENOUS: 7.26 — ABNORMAL LOW (ref 7.32–7.43)
PH VENOUS: 7.32 (ref 7.32–7.43)
PO2 VENOUS: 40 mmHg (ref 30–55)
PO2 VENOUS: 96 mmHg — ABNORMAL HIGH (ref 30–55)
PO2 VENOUS: 29 mmHg — ABNORMAL LOW (ref 30–55)
POTASSIUM WHOLE BLOOD: 4.6 mmol/L (ref 3.4–4.6)
POTASSIUM WHOLE BLOOD: 4.7 mmol/L — ABNORMAL HIGH (ref 3.4–4.6)
POTASSIUM WHOLE BLOOD: 4.9 mmol/L — ABNORMAL HIGH (ref 3.4–4.6)
SODIUM WHOLE BLOOD: 142 mmol/L (ref 135–145)
SODIUM WHOLE BLOOD: 140 mmol/L (ref 135–145)
SODIUM WHOLE BLOOD: 140 mmol/L (ref 135–145)

## 2021-09-23 LAB — CBC W/ AUTO DIFF
BASOPHILS ABSOLUTE COUNT: 0.1 10*9/L (ref 0.0–0.1)
BASOPHILS RELATIVE PERCENT: 0.6 %
EOSINOPHILS ABSOLUTE COUNT: 0.1 10*9/L (ref 0.0–0.5)
EOSINOPHILS RELATIVE PERCENT: 0.6 %
HEMATOCRIT: 21.9 % — ABNORMAL LOW (ref 39.0–48.0)
HEMOGLOBIN: 7.2 g/dL — ABNORMAL LOW (ref 12.9–16.5)
LYMPHOCYTES ABSOLUTE COUNT: 0.6 10*9/L — ABNORMAL LOW (ref 1.1–3.6)
LYMPHOCYTES RELATIVE PERCENT: 6.7 %
MEAN CORPUSCULAR HEMOGLOBIN CONC: 33 g/dL (ref 32.0–36.0)
MEAN CORPUSCULAR HEMOGLOBIN: 30 pg (ref 25.9–32.4)
MEAN CORPUSCULAR VOLUME: 91 fL (ref 77.6–95.7)
MEAN PLATELET VOLUME: 10.3 fL (ref 6.8–10.7)
MONOCYTES ABSOLUTE COUNT: 0.7 10*9/L (ref 0.3–0.8)
MONOCYTES RELATIVE PERCENT: 8.1 %
NEUTROPHILS ABSOLUTE COUNT: 7.7 10*9/L (ref 1.8–7.8)
NEUTROPHILS RELATIVE PERCENT: 84 %
PLATELET COUNT: 126 10*9/L — ABNORMAL LOW (ref 150–450)
RED BLOOD CELL COUNT: 2.41 10*12/L — ABNORMAL LOW (ref 4.26–5.60)
RED CELL DISTRIBUTION WIDTH: 17.8 % — ABNORMAL HIGH (ref 12.2–15.2)
WBC ADJUSTED: 9.2 10*9/L (ref 3.6–11.2)

## 2021-09-23 LAB — MAGNESIUM: MAGNESIUM: 2.3 mg/dL (ref 1.6–2.6)

## 2021-09-23 LAB — COMPREHENSIVE METABOLIC PANEL
ALBUMIN: 1.8 g/dL — ABNORMAL LOW (ref 3.4–5.0)
ALKALINE PHOSPHATASE: 312 U/L — ABNORMAL HIGH (ref 46–116)
ALT (SGPT): 133 U/L — ABNORMAL HIGH (ref 10–49)
ANION GAP: 10 mmol/L (ref 5–14)
AST (SGOT): 74 U/L — ABNORMAL HIGH (ref <=34)
BILIRUBIN TOTAL: 0.5 mg/dL (ref 0.3–1.2)
BLOOD UREA NITROGEN: 108 mg/dL — ABNORMAL HIGH (ref 9–23)
BUN / CREAT RATIO: 36
CALCIUM: 8.5 mg/dL — ABNORMAL LOW (ref 8.7–10.4)
CHLORIDE: 103 mmol/L (ref 98–107)
CO2: 30 mmol/L (ref 20.0–31.0)
CREATININE: 2.97 mg/dL — ABNORMAL HIGH
EGFR CKD-EPI (2021) MALE: 22 mL/min/{1.73_m2} — ABNORMAL LOW (ref >=60)
GLUCOSE RANDOM: 63 mg/dL — ABNORMAL LOW (ref 70–179)
POTASSIUM: 4.8 mmol/L (ref 3.4–4.8)
PROTEIN TOTAL: 4.9 g/dL — ABNORMAL LOW (ref 5.7–8.2)
SODIUM: 143 mmol/L (ref 135–145)

## 2021-09-23 LAB — PHOSPHORUS: PHOSPHORUS: 7.1 mg/dL — ABNORMAL HIGH (ref 2.4–5.1)

## 2021-09-23 MED ADMIN — sodium zirconium cyclosilicate (LOKELMA) packet 10 g: 10 g | GASTROENTERAL | @ 12:00:00 | Stop: 2021-09-23

## 2021-09-23 MED ADMIN — HYDROmorphone 1 mg/mL in 0.9% sodium chloride: 0-10 mg/h | INTRAVENOUS | @ 19:00:00 | Stop: 2021-09-23

## 2021-09-23 MED ADMIN — dextrose 5 % infusion: 25 mL/h | INTRAVENOUS | @ 08:00:00 | Stop: 2021-09-23

## 2021-09-23 MED ADMIN — tacrolimus (PROGRAF) oral suspension: 1 mg | GASTROENTERAL | @ 12:00:00 | Stop: 2021-09-23

## 2021-09-23 MED ADMIN — levothyroxine (SYNTHROID) tablet 250 mcg: 250 ug | GASTROENTERAL | @ 09:00:00 | Stop: 2021-09-23

## 2021-09-23 MED ADMIN — predniSONE (DELTASONE) tablet 5 mg: 5 mg | ORAL | @ 12:00:00 | Stop: 2021-09-23

## 2021-09-23 MED ADMIN — white petrolatum-mineral oil (SOOTHE PM) 80-20 % ophthalmic ointment 1 application: 1 | OPHTHALMIC

## 2021-09-23 MED ADMIN — azithromycin (ZITHROMAX) 500 mg in sodium chloride (NS) 0.9 % 250 mL IVPB: 500 mg | INTRAVENOUS | @ 17:00:00 | Stop: 2021-09-23

## 2021-09-23 MED ADMIN — HYDROmorphone (PF) (DILAUDID) injection 2 mg: 2 mg | INTRAVENOUS | @ 19:00:00 | Stop: 2021-09-23

## 2021-09-23 MED ADMIN — dextrose 10 % infusion: 25 mL/h | INTRAVENOUS | @ 14:00:00 | Stop: 2021-09-23

## 2021-09-23 MED ADMIN — white petrolatum-mineral oil (SOOTHE PM) 80-20 % ophthalmic ointment 1 application: 1 | OPHTHALMIC | @ 12:00:00 | Stop: 2021-09-23

## 2021-09-23 MED ADMIN — sevelamer oral solution: 800 mg | GASTROENTERAL | @ 14:00:00 | Stop: 2021-09-23

## 2021-09-23 MED ADMIN — atropine 1 % ophthalmic ointment 1 application: 1 | TOPICAL | @ 21:00:00 | Stop: 2021-09-23

## 2021-09-23 MED ADMIN — tacrolimus (PROGRAF) oral suspension: 1.5 mg | GASTROENTERAL

## 2021-09-23 MED ADMIN — esomeprazole (NexIUM) granules 40 mg: 40 mg | GASTROENTERAL | @ 09:00:00 | Stop: 2021-09-23

## 2021-09-23 MED ADMIN — cefepime (MAXIPIME) 2 g in sodium chloride 0.9 % (NS) 100 mL IVPB-connector bag: 2 g | INTRAVENOUS | @ 16:00:00 | Stop: 2021-09-23

## 2021-09-23 MED ADMIN — dextrose (D10W) 10% bolus 125 mL: 12.5 g | INTRAVENOUS | @ 03:00:00 | Stop: 2022-09-19

## 2021-09-23 NOTE — Unmapped (Signed)
Per MD to place pt on Nasal Cannula. Rn aware.

## 2021-09-23 NOTE — Unmapped (Signed)
MICU Evening Summary     Date of Service: 09/22/2021    Interval History: Miguel Ward is a 70 y.o. male with HTN, renal transplant 2009, HFrEF, NSTEMI, and lupus presented with acute hypoxic respiratory failure and COVID-19 positive. Critical care services are indicated for:    Principal Problem (Resolved):    Acute respiratory distress syndrome (ARDS) due to COVID-19 virus (CMS-HCC)  Active Problems:    Hypercholesterolemia    Hypertension    History of kidney transplant    Renal cell carcinoma (CMS-HCC)    Immunosuppression-related infectious disease (CMS-HCC)    Bilateral lower extremity edema    Elevated troponin I level    GERD (gastroesophageal reflux disease)    Diastolic heart failure (CMS-HCC)    Acute on chronic HFrEF (heart failure with reduced ejection fraction) (CMS-HCC)    COVID    ATN (acute tubular necrosis) (CMS-HCC)    Acute renal failure with tubular necrosis (CMS-HCC)        Assessment & Plan     Neuro: no longer following commands, worsening mental status 2/2 hypercapnic respiratory failure  Pulm: BIPAP increased IPAP to improve CO2, continues to requiring frequent suctioning  CV: levo gtt for MAP > 65 goal  Renal: iHD last 10/8, given pressor requirement may need CRRT okay to hold off for now  GI: held TF given BIPAP  Heme: stable H&H after transfusion, Held [facility] for now, likely restart tomorrow   Endo: held NPH given NPO  ID: continue levaquin for legionnaires, back to home dose of prednisone 5mg  daily, tacro, and holding cellcept     Critical Care Attestation     This patient is critically ill or injured with the impairment of vital organ systems such that there is a high probability of imminent or life threatening deterioration in the patient's condition. This patient must remain in the ICU for ongoing evaluation of the comprehensive management plan outlined in this note. I directly provided critical care services as documented in this note and the critical care time spent (40  min) is exclusive of separately billable procedures.  In addition to time spent for critical care management, I also provided advance care planning services for 0  minutes (see ACP note for details)???. Total billable critical care time 40  minutes.    Toma Aran, PA

## 2021-09-23 NOTE — Unmapped (Signed)
Patient remained on BIPAP 16/+8 30% overnight with stable VBG this morning.

## 2021-09-23 NOTE — Unmapped (Signed)
[facility] Daily Progress Note     Date of Service: 2021/10/20    Problem List:   Principal Problem (Resolved):    Acute respiratory distress syndrome (ARDS) due to COVID-19 virus (CMS-HCC)  Active Problems:    Hypercholesterolemia    Hypertension    History of kidney transplant    Renal cell carcinoma (CMS-HCC)    Immunosuppression-related infectious disease (CMS-HCC)    Bilateral lower extremity edema    Elevated troponin I level    GERD (gastroesophageal reflux disease)    Diastolic heart failure (CMS-HCC)    Acute on chronic HFrEF (heart failure with reduced ejection fraction) (CMS-HCC)    COVID    ATN (acute tubular necrosis) (CMS-HCC)    Acute renal failure with tubular necrosis (CMS-HCC)      HPI: Miguel Ward is a 70 y.o. male renal transplant from lupus nephritis (complex course over last 15 years since transplant followed here including renal cell) here with acute hypoxic respiratory failure, COVID 19 and shock with AKI.    24hr events:  - mentation improved w/ bipap  - trial off bipap this morning     Neurological   #acute metabolic encephalopathy  - analegsia prn  - CT head 9/29 no acute findings  - neuro exam improving >> sustained wakefulness and interaction, non focal. Remains intermittently drowsy and weak   - more lethargic and encephalopthic r/t hypercapnea and kidney failure--> improved w/ bipap, although still not robust     ? Critical illness polyneuropathy:  - cont pt/ot/speech when approp    Pulmonary   #ARDS, Acute resp failure, resolving   - intubated 9/25 on minimal settings, passing SBTs yet precluded extubation for mentation  - bronched 9/29 secretions blood tinged in trach and mainstem airways, none seen in distal airways  -  extubated to [place] on 10/2  - tolerating RA well, but with rhonchi and weak cough. cont chest PT/nebs / NT suctioning   - freq suctioning NTS for inability to clear secretions well likely contributing to hypercapnea       Cardiovascular   #septic shock, elevated trop  #HFrEF, ho htn, hld, cad  - formal echo 9/26 EF @ 30%, 2020 ECHO 40-45%  - cont home regimen asa (holding statin due to elevated lfts) >> holding asa d/t downtrend hgb and transfusions  - hold home regimen metop, vasotec, lasix  - SDS 9/26--> 10/1  - levo for MAP>65, off since 10/2 >> resumed on 10/10 in setting of acidosis, low dose at 2    Renal   #AKI (baseline Cr 2.7-3), AGMA likely ATN 2/2 sepsis and hypotension  #hx renal transplant s/t lupus nephritis, c/b rejection and renal cell CA s/p cryo ablation  - worsening renal fxn and CRRT started 9/26--> 10/4.   - continue tac with goal of 2-4   - prednisone 15 mg , but will lower to 10 mg 10/6 x 5 days and then to home dose of 5mg    - continue to hold cellcept  - cont magox and calcitriol  - replete electrolytes prn  -  iHD last 10/8     Infectious Disease/Autoimmune   #COVID 19 infection  Symptom onset:??9/22  Exposure: unknown  COVID PCR+: neg x 3  (home test positive 9/23?)  Day of admission/transfer: 9/25  COVID Specific??Meds:??none given   Vaccination status: full & boosted x once, evushield given 8/17  3 PCRs since admission negative and taken off precautions   ??  9/25 Bcx neg  9/25 Ucx neg UA 3 WBC  9/25 Lower resp cx, OPF  9/25 SARS-CoV-2 negative for NP and trach aspirate  9/25 CrAg negative  9/26 RPP negative  9/26 Legionella Ag (urine) positive  9/26 Legionella PCR positive  9/26 CMV PCR negative  ??  Multifocal PNA:   - on vanco/cefepime on 9/25, stopped 10/3 per ICID  - MRSA screen neg and vanco stopped 9/27, restarted 9/29 in the setting of worsening pressor requirements and GPC seen on BAL from Bron[place] on 9/29  - CT Chest 9/29 c/w volume overload, pna and rt pleural effusion    Legionella, PNA   - levofloxacin started 9/26 was stopped at 10 day and resumed to finish 10/12   ??  Cultures:  Blood Culture, Routine (no units)   Date Value   09/11/2021 No Growth at 5 days     Urine Culture, Comprehensive (no units)   Date Value   10/06/2021 NO GROWTH Lower Respiratory Culture (no units)   Date Value   09/11/2021 Specimen Not Processed   October 06, 2021 OROPHARYNGEAL FLORA ISOLATED     WBC (10*9/L)   Date Value   10/10/2021 9.2     WBC, UA (/HPF)   Date Value   10/06/2021 <1        FEN/GI   #gerd  - TF tol well at goal + dysphagia 6 diet   - ppi for gi ppx  - bowel regimen held for loose stools    Transaminitis and Elevated bilirubin (improving): overall all uptrending, [place] RUQ didn't demonstrated acute cholecystitis.  - continue to trend LFTs  - potential need for HIDA if continues to increase holding off given bilirubin downtrending   - [place] negative.  Also stopped statin and abx in the past 24hrs so will monitor -->  Fluctuating, CTM   - GI consult     Malnutrition Assessment: Not done yet.  Body mass index is 22.68 kg/m??.        Heme/Coag   Rt groin bleeding s/p line removal, resolved  - was on heparin gtt for NSTEMI and afib- stopped 9/27  - continue SQH for DVT ppx  - HH, plts, coags acceptable  - SCDs for DVT ppx  - last transfused 10/8.  Still oozing from old line sites and insulin injection sites.  DIC panel ok. -   - dose of DDAVP 10/8 >> hemostasis    GIB  - noted to have tarry stools,   - cont ppi, holding asa as above  - ongoing GOC discussions re escalation of care    Endocrine   #hypoglycemic, hypothryoid  - cont ADI + accu checks , NPH 25 mg bid ==> endotool 10/5 --> decreased to NPH 10 bid   - hold home regimen allopurinol  - cont home regimen synthroid    Integumentary   #NAI  - WOCN consulted for high risk skin assessment No. Reason: no alterations in skin.  - WOCN recs >> agree with assmt and plan 9/30  - cont pressure mitigating precautions per skin policy    Prophylaxis/LDA/Restraints/Consults   Can CVC be removed? No: dialysis catheter   Can A-line be removed?n/a   Can Foley be removed? No: Need continuous I/O  Mobility plan: Step 2 - Head of bed elevation (>60 degrees)    Feeding: Tube feeds at goal  Analgesia: Pain adequately controlled  Sedation SAT/SBT: N/A  Thromboembolic ppx: SQ heparin  Head of bed >30 degrees: Yes  Ulcer ppx: Yes, home use continued  Glucose within target range: Yes, in range    Does patient need/have an active type/screen? Yes    RASS at goal? No - adjusting sedation or order to reflect need  [place] Agitation Assessment Scale (RASS) : -1 (09/18/2021  8:00 AM)     Can antipsychotics be stopped? N/A, not on antipsychotics  CAM-ICU Result: Positive (09/10/2021  8:00 PM)      Would hospice care be appropriate for this patient? No, patient improving or expected to improve  Any unaddressed hospice/palliative care needs? no    Patient Lines/Drains/Airways Status     Active Active Lines, Drains, & Airways     Name Placement date Placement time Site Days    Hemodialysis Catheter With Distal Infusion Port 09/08/21 Right Internal jugular 09/08/21  1600  Internal jugular  14    NG/OG Tube Feedings Left nostril 09/14/21  1500  Left nostril  8    Urethral Catheter Temperature probe 16 Fr. Sep 16, 2021  1602  Temperature probe  15    Peripheral IV 09/22/21 Left Hand 09/22/21  0900  Hand  1    Arteriovenous Fistula - Vein Graft  Access 11/01/18 Right;Upper Arm 11/01/18  --  Arm  1057              Patient Lines/Drains/Airways Status     Active Wounds     Name Placement date Placement time Site Days    Surgical Site 09/17/21 Leg Right;Other (Comment) 09/17/21  1000  -- 5    Wound 09/09/21 Pressure Injury Sacrum Right;Mid dark red nonblanching with dark skin surrounding DTI 09/09/21  2130  Sacrum  13                Goals of Care     Code Status: Full Code    Designated Healthcare Decision Maker:  Mr. Sorbello current decisional capacity for healthcare decision-making is Full capacity. His designated healthcare decision maker(s) is/are   HCDM (patient stated preference): Gitto,Katherine - Spouse - [phone].    Subjective   Somnolent     Objective     Vitals - past 24 hours  Temp:  [36 ??C (96.8 ??F)-37.3 ??C (99.1 ??F)] 36.9 ??C (98.4 ??F)  Heart Rate:  [66-110] 96  SpO2 Pulse:  [63-111] 105  Resp:  [11-22] 18  BP: (88-145)/(37-75) 145/65  FiO2 (%):  [30 %-36 %] 30 %  SpO2:  [95 %-100 %] 96 % Intake/Output  I/O last 3 completed shifts:  In: 1446.3 [I.V.:151.3; Blood:240; NG/GT:830; IV Piggyback:225]  Out: 535 [Urine:535]     Physical Exam:    General: thin elderly male, in NAD, obtunded  HEENT: PERRL, slightly icteric sclera w edema  CV: RRR, no m/r/g, 2+ BLE edema   Pulm: diminished throughout, course crackles no wheezing, dec bases  GI: abd round, non tender  MSK: grossly intact, tone intact  Skin: grossly intact, warm  Neuro: GCS [id number], non focal motor, flicker flexion x 4       Continuous Infusions:   ??? dextrose     ??? norepinephrine bitartrate-NS Stopped (09/30/2021 0500)       Scheduled Medications:   ??? calcitRIOL  0.25 mcg Enteral tube: gastric  Q MWF   ??? esomeprazole  40 mg Enteral tube: gastric  daily   ??? flu vacc [id number] 6mos up(PF)  0.5 mL Intramuscular During hospitalization   ??? insulin regular  0-20 Units Subcutaneous Q6H SCH   ??? levoFLOXacin  500 mg Intravenous Q48H   ??? levothyroxine  250 mcg  Enteral tube: gastric  daily   ??? [START ON 09/24/2021] predniSONE  5 mg Enteral tube: gastric  Daily   ??? sevelamer  800 mg Enteral tube: gastric  Q8H SCH   ??? sodium zirconium cyclosilicate  10 g Enteral tube: gastric  Daily   ??? Tacrolimus  1 mg Enteral tube: gastric  Daily   ??? Tacrolimus  1.5 mg Enteral tube: gastric  Nightly (2000)   ??? white petrolatum-mineral oil  1 application Both Eyes BID       PRN medications:  albumin human bottle 25 %, dextrose in water, heparin (porcine), heparin (porcine), ondansetron, sodium chloride    Data/Imaging Review: Reviewed in Epic and personally interpreted on 10/07/2021. See EMR for detailed results.        Critical Care Attestation     This patient is critically ill or injured with the impairment of vital organ systems such that there is a high probability of imminent or life threatening deterioration in the patient's condition. This patient must remain in the ICU for ongoing evaluation of the comprehensive management plan outlined in this note. I directly provided critical care services as documented in this note and the critical care time spent (55  min) is exclusive of separately billable procedures.  In addition to time spent for critical care management, I also provided advance care planning services for 0  minutes (see ACP note for details)???. Total billable critical care time 55  minutes.       Daryel November, ACNP

## 2021-09-23 NOTE — Unmapped (Signed)
Vancomycin Therapeutic Monitoring Pharmacy Note    Miguel Ward is a 70 y.o. male starting vancomycin. Date of therapy initiation: 09/25/2021    Indication: Suspected infection     Prior Dosing Information: None/new initiation     Goals:  Therapeutic Drug Levels  Vancomycin trough goal: 10-20 mg/L    Additional Clinical Monitoring/Outcomes  Renal function, volume status (intake and output)    Results: Not applicable    Wt Readings from Last 1 Encounters:   15-Sep-2021 71.7 kg (158 lb 1.1 oz)     Creatinine   Date Value Ref Range Status   09/17/2021 2.97 (H) 0.60 - 1.10 mg/dL Final   16/09/9603 5.40 (H) 0.60 - 1.10 mg/dL Final   98/10/9146 8.29 (H) 0.60 - 1.10 mg/dL Final        Pharmacokinetic Considerations and Significant Drug Interactions:  ??? ESRD on IHD, Estimated Vd = 50 L  ??? Concurrent nephrotoxic meds: tacrolimus    Assessment/Plan:  Recommendation(s)  ??? Start vancomycin 1250mg  x 1  ??? Estimated trough on recommended regimen: Not applicable - dosing by level    Follow-up  ??? Level due: Wednesday, 09/24/21 with AM labs  ??? A pharmacist will continue to monitor and order levels as appropriate    Please page service pharmacist with questions/clarifications.    Charleen Kirks, PharmD

## 2021-09-23 NOTE — Unmapped (Addendum)
7a shift summary:  Q2 hour turns/skin care completed and documented. Tube feedings have been stopped. Continues on BiPAP. Patient somnolent/lethargic throughout shift. Code status change to DNR/DNI. Family at bedside. Weaning pressors as tolerated. Patient remained free from falls/injury. Monitoring VS, remained afebrile. Bed low and locked. No complaints expressed at this time, will continue to monitor.        Problem: Fall Injury Risk  Goal: Absence of Fall and Fall-Related Injury  Outcome: Ongoing - Unchanged  Intervention: Promote Injury-Free Environment  Recent Flowsheet Documentation  Taken 09/22/2021 0800 by Geanie Berlin, RN  Safety Interventions:  ??? aspiration precautions  ??? enteral feeding safety  ??? fall reduction program maintained  ??? family at bedside  ??? infection management  ??? lighting adjusted for tasks/safety  ??? low bed  ??? nonskid shoes/slippers when out of bed     Problem: Infection  Goal: Absence of Infection Signs and Symptoms  Outcome: Ongoing - Unchanged     Problem: Adult Inpatient Plan of Care  Goal: Plan of Care Review  Outcome: Ongoing - Unchanged  Goal: Patient-Specific Goal (Individualized)  Outcome: Ongoing - Unchanged  Goal: Absence of Hospital-Acquired Illness or Injury  Outcome: Ongoing - Unchanged  Intervention: Identify and Manage Fall Risk  Recent Flowsheet Documentation  Taken 09/22/2021 0800 by Geanie Berlin, RN  Safety Interventions:  ??? aspiration precautions  ??? enteral feeding safety  ??? fall reduction program maintained  ??? family at bedside  ??? infection management  ??? lighting adjusted for tasks/safety  ??? low bed  ??? nonskid shoes/slippers when out of bed  Intervention: Prevent and Manage VTE (Venous Thromboembolism) Risk  Recent Flowsheet Documentation  Taken 09/22/2021 0800 by Geanie Berlin, RN  Activity Management:  ??? bedrest  ??? activity adjusted per tolerance  Goal: Optimal Comfort and Wellbeing  Outcome: Ongoing - Unchanged  Goal: Readiness for Transition of Care  Outcome: Ongoing - Unchanged  Goal: Rounds/Family Conference  Outcome: Ongoing - Unchanged     Problem: Skin Injury Risk Increased  Goal: Skin Health and Integrity  Outcome: Ongoing - Unchanged  Intervention: Optimize Skin Protection  Recent Flowsheet Documentation  Taken 09/22/2021 1600 by Geanie Berlin, RN  Pressure Reduction Techniques:  ??? frequent weight shift encouraged  ??? heels elevated off bed  Head of Bed [facility]) Positioning: HOB at 30-45 degrees  Taken 09/22/2021 1400 by Geanie Berlin, RN  Pressure Reduction Techniques:  ??? frequent weight shift encouraged  ??? heels elevated off bed  Taken 09/22/2021 1200 by Geanie Berlin, RN  Pressure Reduction Techniques:  ??? frequent weight shift encouraged  ??? heels elevated off bed  Head of Bed [facility]) Positioning: HOB at 30-45 degrees  Taken 09/22/2021 1000 by Geanie Berlin, RN  Pressure Reduction Techniques:  ??? frequent weight shift encouraged  ??? heels elevated off bed  Taken 09/22/2021 0800 by Geanie Berlin, RN  Pressure Reduction Techniques:  ??? frequent weight shift encouraged  ??? heels elevated off bed  Head of Bed (HOB) Positioning: HOB at 30-45 degrees     Problem: Dysrhythmia (Heart Failure)  Goal: Stable Heart Rate and Rhythm  Outcome: Ongoing - Unchanged     Problem: Fluid Imbalance (Heart Failure)  Goal: Fluid Balance  Outcome: Ongoing - Unchanged     Problem: Respiratory Compromise (Heart Failure)  Goal: Effective Oxygenation and Ventilation  Outcome: Ongoing - Unchanged     Problem: Impaired Wound Healing  Goal: Optimal Wound Healing  Outcome: Ongoing - Unchanged  Intervention: Promote Wound Healing  Recent Flowsheet Documentation  Taken 09/22/2021 0800 by Geanie Berlin, RN  Activity Management:  ??? bedrest  ??? activity adjusted per tolerance     Problem: Self-Care Deficit  Goal: Improved Ability to Complete Activities of Daily Living  Outcome: Ongoing - Unchanged     Problem: Diabetes Comorbidity  Goal: Blood Glucose Level Within Targeted Range  Outcome: Ongoing - Unchanged     Problem: Heart Failure Comorbidity  Goal: Maintenance of Heart Failure Symptom Control  Outcome: Ongoing - Unchanged     Problem: Hypertension Comorbidity  Goal: Blood Pressure in Desired Range  Outcome: Ongoing - Unchanged     Problem: Device-Related Complication Risk (Hemodialysis)  Goal: Safe, Effective Therapy Delivery  Outcome: Ongoing - Unchanged     Problem: Hemodynamic Instability (Hemodialysis)  Goal: Effective Tissue Perfusion  Outcome: Ongoing - Unchanged     Problem: Infection (Hemodialysis)  Goal: Absence of Infection Signs and Symptoms  Outcome: Ongoing - Unchanged

## 2021-09-24 NOTE — Unmapped (Signed)
Physician Discharge Summary    Identifying Information:   Miguel Ward  30-Jun-1951  [id number]    Admit date: 09/05/2021    Discharge date: 10-02-21     Discharge Service: Medical ICU (MDI)    Discharge Attending Physician: Debbe Bales, MD    Discharge to: Deceased -  Miguel Ward had a pronouncement of death 02-Oct-2021 and the time of death is 1828 . The pronouncement of death was made by myself.   The events prior to death were transition to comfort measures in the setting of progressive illness.  The parties present at time of death was/were family. The patient's HCPOA was notified of the death. This case was not referred to the medical examiner. An autopsy was declined.    Discharge Diagnoses:  Principal Problem (Resolved):    Acute respiratory distress syndrome (ARDS) due to COVID-19 virus (CMS-HCC)  Active Problems:    Hypercholesterolemia    Hypertension    History of kidney transplant    Renal cell carcinoma (CMS-HCC)    Immunosuppression-related infectious disease (CMS-HCC)    Bilateral lower extremity edema    Elevated troponin I level    GERD (gastroesophageal reflux disease)    Diastolic heart failure (CMS-HCC)    Acute on chronic HFrEF (heart failure with reduced ejection fraction) (CMS-HCC)    COVID    ATN (acute tubular necrosis) (CMS-HCC)    Acute renal failure with tubular necrosis (CMS-HCC)      Hospital Course:   70 year old with renal transplant from lupus nephritis (complex course over last 15 years since transplant followed here including renal cell) here with acute hypoxic respiratory failure, and shock with AKI. He was symptomatic on Tuesday, and apparently had a positive home COVID test on Friday, and was to get MAB Monday. He is moderna x 3 and evushield in Feb. He was found unresponsive, hypoxic and hypoglycemic at home.     On admission here at [facility] he arrived intubated. Multiple COVID tests came back negative, and infectious workup continued. Ultimately he had a bronchoscopy and a LRCx came back w/ legionella. Abx adjusted to treat accordingly. His AKI persisted and he was on CRRT for a time, before transitioning to HD. Ultimately we were able to extubate him, however he remained profoundly weak w/ a very weak cough. Ultimately he had worsening hypercarbic respiratory failure and became bipap dependent. He worsened, requiring increased pressors, overnight on 10/10, and family wished to move towards comfort measures today. Pt was removed from bipap after dilaudid infusion started.     Procedures:  internal jugular line and dialysis, art line  No admission procedures for hospital encounter.  _____________________________________________________________________________  Discharge Day Services:  BP 113/57  - Pulse 105  - Temp 35.8 ??C (96.4 ??F)  - Resp 24  - Ht 177.8 cm (5' 10)  - Wt 71.7 kg (158 lb 1.1 oz)  - SpO2 (!) 54%  - BMI 22.68 kg/m??   Pt seen on the day of discharge and determined appropriate for discharge.    Condition at Discharge: deceased    Length of Discharge: I spent less than 30 mins in the discharge of this patient.  _____________________________________________________________________________  Discharge Medications:     Your Medication List      ASK your doctor about these medications    acyclovir 400 MG tablet  Commonly known as: ZOVIRAX  Take 1 tablet (400 mg total) by mouth daily.     allopurinoL 100 MG tablet  Commonly  known as: ZYLOPRIM  Take 1 tablet (100 mg total) by mouth in the morning.     aspirin 81 MG chewable tablet  Chew 81 mg daily.     atorvastatin 20 MG tablet  Commonly known as: LIPITOR  TAKE 1 TABLET BY MOUTH NIGHTLY.     calcitrioL 0.25 MCG capsule  Commonly known as: ROCALTROL  TAKE 1 CAPSULE BY MOUTH EVERY MONDAY, WEDNESDAY, AND FRIDAY AT 4PM.     enalapril 5 MG tablet  Commonly known as: VASOTEC  Take 1 tablet (5 mg total) by mouth Two (2) times a day.     ferrous sulfate 325 (65 FE) MG tablet  Take 1 tablet (325 mg total) by mouth daily.     fluticasone propionate 50 mcg/actuation nasal spray  Commonly known as: FLONASE  1 spray into each nostril daily as needed.     furosemide 20 MG tablet  Commonly known as: LASIX  Take 40 mg in the morning and 20 mg in the evening.     levothyroxine 200 MCG tablet  Commonly known as: SYNTHROID  Take 1 tab with two tab, total dose daily     levothyroxine 25 MCG tablet  Commonly known as: SYNTHROID  TAKE (2) TABLETS BY MOUTH EVERY DAY WITH1 TAB FOR A TOTAL DAILY DOSE OF     magnesium oxide 400 mg (241.3 mg elemental) tablet  Commonly known as: MAG-OX  Take 1 tablet (400 mg total) by mouth Two (2) times a day.     metoprolol succinate 100 MG 24 hr tablet  Commonly known as: TOPROL-XL  Take 1 tablet (100 mg total) by mouth daily.     mycophenolate 250 mg capsule  Commonly known as: CELLCEPT  Take 1 capsule (250 mg total) by mouth Two (2) times a day.     omeprazole 20 MG capsule  Commonly known as: PriLOSEC  Take 1 capsule (20 mg total) by mouth in the morning.     predniSONE 5 MG tablet  Commonly known as: DELTASONE  Take 1 tablet (5 mg total) by mouth daily.     simethicone 80 MG chewable tablet  Commonly known as: GAS-X  Chew 1 tablet (80 mg total) every six (6) hours as needed for flatulence.     tacrolimus 1 MG capsule  Commonly known as: PROGRAF  Take 3 capsules (3 mg total) by mouth every morning AND 2 capsules (2 mg total) nightly.          _____________________________________________________________________________  Pending Test Results (if blank, then none):  Pending Labs     Order Current Status    Blood Culture, Adult In process    Blood Culture, Adult In process    MRSA Screen In process    AFB culture Preliminary result    Actinomyces Screen Preliminary result    Fungal Culture Preliminary result          Most Recent Labs:  Microbiology Results (last day)     Procedure Component Value Date/Time Date/Time    Blood Culture, Adult [[id number]] Collected: 09/30/2021 1201 Lab Status: In process Specimen: Blood from 1 Peripheral Draw Updated: 09/20/2021 1238    Blood Culture, Adult [[id number]] Collected: 10/10/2021 1201    Lab Status: In process Specimen: Blood from 1 Peripheral Draw Updated: 10/04/2021 1238    MRSA Screen [[id number]] Collected: 09/27/2021 1127    Lab Status: In process Specimen: Nares from Nose Updated: 10/07/2021 1226  Lab Results   Component Value Date    WBC 9.2 10-11-2021    HGB 7.2 (L) October 11, 2021    HCT 21.9 (L) October 11, 2021    PLT 126 (L) 10/11/21       Lab Results   Component Value Date    NA 140 10-11-2021    K 4.9 (H) 11-Oct-2021    CL 103 2021/10/11    CO2 30.0 2021-10-11    BUN 108 (H) 2021/10/11    CREATININE 2.97 (H) 10-11-21    CALCIUM 8.5 (L) 10-11-2021    MG 2.3 11-Oct-2021    PHOS 7.1 (H) 2021-10-11       Lab Results   Component Value Date    ALKPHOS 312 (H) 10/11/21    BILITOT 0.5 10/11/21    BILIDIR 0.30 09/22/2021    PROT 4.9 (L) 10-11-2021    ALBUMIN 1.8 (L) 10-11-2021    ALT 133 (H) October 11, 2021    AST 74 (H) 10/11/21    GGT 991 (H) 09/16/2021       Lab Results   Component Value Date    PT 13.9 (H) 09/18/2021    INR 1.22 09/18/2021    APTT 36.9 (H) 09/18/2021     Hospital Radiology:  ECG 12 Lead    Result Date: 09/18/2021  ATRIAL FIBRILLATION ST & T WAVE ABNORMALITY, CONSIDER LATERAL ISCHEMIA ABNORMAL ECG WHEN COMPARED WITH ECG OF 17-Sep-2021 14:24, NO SIGNIFICANT CHANGE WAS FOUND Confirmed by Pollyann Kennedy ([id number]) on 09/18/2021 11:26:33 PM    ECG 12 Lead    Result Date: 09/17/2021  ATRIAL FIBRILLATION ST & T WAVE ABNORMALITY, CONSIDER LATERAL ISCHEMIA ABNORMAL ECG WHEN COMPARED WITH ECG OF 15-Sep-2021 06:32, T WAVE INVERSION NOW EVIDENT IN ANTERIOR LEADS Confirmed by Eldred Manges ([id number]) on 09/17/2021 6:31:09 PM    ECG 12 Lead    Result Date: 09/15/2021  ATRIAL FIBRILLATION WITH RAPID VENTRICULAR RESPONSE NONSPECIFIC T WAVE ABNORMALITY ABNORMAL ECG WHEN COMPARED WITH ECG OF 08-Sep-2021 06:15, ATRIAL FIBRILLATION HAS REPLACED ATRIAL FLUTTER T WAVE INVERSION NOW EVIDENT IN LATERAL LEADS Confirmed by Rose-jones, Lisa ([id number]) on 09/15/2021 8:56:53 AM    ECG 12 Lead    Result Date: 09/08/2021  ATRIAL FLUTTER WITH VARIABLE A-V BLOCK PROLONGED QT ABNORMAL ECG WHEN COMPARED WITH ECG OF 07-Sep-2021 21:50, NO SIGNIFICANT CHANGE WAS FOUND Confirmed by Rose-jones, Lisa ([id number]) on 09/08/2021 8:23:03 AM    ECG 12 Lead    Result Date: 09/08/2021  ATRIAL FLUTTER WITH VARIABLE A-V BLOCK NONSPECIFIC ST AND T WAVE ABNORMALITY ABNORMAL ECG WHEN COMPARED WITH ECG OF 07-Sep-2021 16:02, ATRIAL FLUTTER HAS REPLACED ATRIAL FIBRILLATION ST NOW DEPRESSED IN ANTERIOR LEADS Confirmed by Rose-jones, Lisa ([id number]) on 09/08/2021 8:19:13 AM    ECG 12 Lead    Result Date: 08/21/2021  ATRIAL FIBRILLATION WITH RAPID VENTRICULAR RESPONSE NONSPECIFIC ST-T WAVE ABNORMALITIES WHEN COMPARED WITH ECG OF 07-Feb-2019 07:00, VENT. RATE HAS INCREASED BY  47 BPM T WAVE INVERSION LESS EVIDENT IN LATERAL LEADS Confirmed by Schuyler Amor [phone]) on 09/02/2021 5:38:18 PM    XR Chest Portable    Result Date: 09/22/2021  EXAM: XR CHEST PORTABLE DATE: 09/22/2021 9:41 AM ACCESSION: [id number] UN DICTATED: 09/22/2021 10:15 AM [facility]PRET[facility] LOCATION: [facility] CLINICAL INDICATION: 70 years old Male with HYPOXEMIA  COMPARISON: Chest radiograph 09/16/2021 TECHNIQUE: Portable Chest Radiograph. FINDINGS: Unchanged right IJ CVC with tip terminating in the right atrium and nonweighted enteric tube coursing below the diaphragm out of field of view. Increasing patchy alveolar airspace opacities in the right lung. Small right pleural effusion.  Left lung is clear. No pneumothorax. Partially obscured cardiomediastinal silhouette.     Worsening multifocal pneumonia in the right lung.    XR Chest Portable    Result Date: 09/17/2021  EXAM: XR CHEST PORTABLE DATE: 09/16/2021 7:36 PM ACCESSION: [id number] UN DICTATED: 09/17/2021 8:15 AM INTERPRETATION LOCATION: [facility] CLINICAL INDICATION: 70 years old Male with HYPOXEMIA  COMPARISON: Chest radiograph 09/13/2021 TECHNIQUE: Portable Chest Radiograph. FINDINGS: Interval removal of endotracheal tube and left IJ CVC. Unchanged right IJ CVC with tip terminating at the cavoatrial junction and nonweighted esophagogastric tube with tip coursing below the level diaphragm out of the field of view. Grossly unchanged aeration of the lungs with similar elevation the right hemidiaphragm and patchy right airspace opacities. No pleural effusion or pneumothorax. Stable cardiomediastinal silhouette.     Interval removal of endotracheal tube and left IJ CVC with similar lung findings to prior.    XR Chest Portable    Result Date: 09/14/2021  EXAM: XR CHEST PORTABLE DATE: 09/13/2021 7:12 PM ACCESSION: [id number] UN DICTATED: 09/13/2021 8:13 PM INTERPRETATION LOCATION: [facility] CLINICAL INDICATION: 70 years old Male with CATHETER  NON VASCULAR FIT&ADJ  COMPARISON: Chest radiograph dated 09/10/2021 TECHNIQUE: Portable AP chest radiograph. FINDINGS: Endotracheal tube terminates within the mid thoracic trachea. Right IJ central venous catheter with tip terminating at the cavoatrial junction. Left IJ central venous catheter with tip terminating at the level of the confluence of the brachiocephalic veins. Esophagogastric tube courses below the diaphragm and out of the field-of-view. EKG leads project over the chest Largely unchanged aeration of the lungs with bilateral patchy airspace opacities, right greater than left. No pleural effusion or pneumothorax. Unchanged cardiomegaly.     Interval placement of left IJ approach central venous catheter with tip terminating at the confluence of brachiocephalic veins. Otherwise, no significant interval changes when compared to prior CT chest dated 09/11/2021.    XR Chest Portable    Result Date: 09/10/2021  EXAM: XR CHEST PORTABLE DATE: 09/10/2021 2:56 PM ACCESSION: [id number] UN DICTATED: 09/10/2021 3:13 PM [facility]PRETATION LOCATION: [facility] CLINICAL INDICATION: 69 years old Male with HYPOXEMIA  COMPARISON: Chest radiograph 09/08/2021 TECHNIQUE: Portable Chest Radiograph. FINDINGS: Unchanged lines and tubes. Increased patchy, right greater than left, airspace opacities. Likely small right pleural effusion. No pneumothorax. Stable prominent cardiomediastinal silhouette.     Worsening alveolar pulmonary edema, notably on the right. Cannot rule out superimposed infection.    XR Chest Portable    Result Date: 09/08/2021  EXAM: XR CHEST PORTABLE DATE: 09/08/2021 3:46 PM ACCESSION: [id number] UN DICTATED: 09/08/2021 3:57 PM INTERPRETATION LOCATION: [facility] CLINICAL INDICATION: 70 years old Male with LINE CHECK (CATHETER VASCULAR FIT)  COMPARISON: 09/08/2021 TECHNIQUE: Portable Chest Radiograph. FINDINGS: Right IJ central venous catheter tip projects over the superior cavoatrial junction. Endotracheal tube tip projects approximately 5.0 cm above the carina.  Enteric tube coursing below the diaphragm, see same day abdominal radiograph for the evaluation. Similar diffuse heterogeneous airspace opacities, worse throughout the right lung No pleural effusion or pneumothorax. Stable cardiomediastinal silhouette.     Interval right IJ central venous catheter placement. No pneumothorax. Otherwise similar lung examination.    XR Chest Portable    Result Date: 09/08/2021  EXAM: XR CHEST PORTABLE DATE: 09/08/2021 5:33 AM ACCESSION: [id number] UN DICTATED: 09/08/2021 9:13 AM [facility]PRETATION LOCATION: [facility] CLINICAL INDICATION: 70 years old Male with HYPOXEMIA  COMPARISON: 09/04/2021 TECHNIQUE: Portable Chest Radiograph. FINDINGS: Endotracheal tube tip projects 6.1 cm above the carina. Enteric tube coursing out of inferior field of view. Improved aeration of bilateral lungs.  Worsening heterogeneous airspace opacities throughout the right lung. Mild left basilar infiltrate/atelectasis. No pleural effusion or pneumothorax. Stable, prominent cardiomediastinal silhouette.     Worsening right lung heterogeneous airspace opacities.    XR Chest Portable    Result Date: 08/22/2021  EXAM: XR CHEST PORTABLE DATE: 08/28/2021 2:37 PM ACCESSION: [id number] UN DICTATED: 08/24/2021 3:17 PM INTERPRETATION LOCATION: [facility] CLINICAL INDICATION: 70 years old Male with CHEST PAIN  COMPARISON: 09/05/2021 TECHNIQUE: Portable Chest Radiograph. FINDINGS: No significant interval changes. Stable support devices.      No significant interval changes. Stable support devices.     XR Chest 1 view Portable    Result Date: 08/21/2021  EXAM: XR CHEST PORTABLE DATE: 09/06/2021 2:38 PM ACCESSION: [id number] UN DICTATED: 09/05/2021 2:57 PM INTERPRETATION LOCATION: [facility] CLINICAL INDICATION: 70 years old Male with tube ; OTHER  COMPARISON: 05/08/2021 TECHNIQUE: Portable Chest Radiograph. FINDINGS: Cardiac silhouette is enlarged. Tortuous thoracic aorta. Airway is patent. Esophagogastric tube terminates below the diaphragm with side port in the stomach. Endotracheal tube at the level of the clavicles, consider advancing 1 to 2 cm. Patchy airspace opacities in the right lung with right upper lobe consolidative changes. Increased retrocardiac density representing atelectasis and/or consolidative changes. Trace left pleural effusion and interstitial edema. No pneumothorax. Visualized osseous structures and subcutaneous soft tissues are unremarkable.     Extensive right upper lobe consolidative changes with other bilateral patchy airspace opacities representing a component of pulmonary edema and/or multifocal sites of pneumonia. Follow-up radiographs upon completion of medical therapy are recommended. Endotracheal tube barely at the level of the clavicles, consider advancing 1 to 2 cm.    CT Head Wo Contrast    Result Date: 09/11/2021  EXAM: Computed tomography, head or brain without contrast material. DATE: 09/11/2021 11:18 AM ACCESSION: [id number] UN DICTATED: 09/11/2021 11:31 AM INTERPRETATION LOCATION: [facility] CLINICAL INDICATION: 70 years old Male with altered mental status in an immunocomp patient  COMPARISON: PET CT 09/14/2016. TECHNIQUE: Axial CT images of the head  from skull base to vertex without contrast. FINDINGS: There is mild global age related atrophy. There is a small focus of encephalomalacia and gliosis in the posterior right frontal lobe suggestive of remote ischemia. The ventricles sulci are otherwise size and configuration for age. There is mild periventricular and centrum semiovale white matter hypoattenuation which is nonspecific but likely reflects chronic microangiopathy. A few scattered opacification of the cerebellum, nonspecific. There is no midline shift. No mass lesion. There is no evidence of acute infarct. No acute intracranial hemorrhage. No fractures are evident. The sinuses are pneumatized. Diffuse vascular calcifications of the internal carotid and external carotid arteries and V4 segments of the vertebral arteries.     1. No acute intracranial findings. 2. Remote right frontal ischemic change with otherwise mild global age related atrophy and chronic microangiopathic change.    CT Chest Wo Contrast    Result Date: 09/11/2021  EXAM: CT CHEST WO CONTRAST DATE: 09/11/2021 11:18 AM ACCESSION: [id number] UN DICTATED: 09/11/2021 12:06 PM INTERPRETATION LOCATION: [facility] CLINICAL INDICATION: 70 year old male ruleout source of infection.  COMPARISON: Portable chest yesterday showing apical mass-like consolidation and extensive consolidation on the right.. TECHNIQUE: Contiguous 0.6 and 3mm axial images were reconstructed through the chest following a single breath hold helical acquisition.  Images were reformatted in the axial and sagittal planes.  MIP slabs were also constructed. FINDINGS: LUNGS AND AIRWAYS: There is masslike consolidation involving the right apex with extensive consolidation also in the posterior segment of the right upper lobe  with air bronchograms. Most of the right lower lobe is totally consolidated also with air bronchograms, with dense consolidation affecting portions of the right middle lobe and left lower lobe as well. The findings are consistent with extensive bilateral pneumonia or aspiration as shown on the radiograph. There is also dilatation of the pulmonary veins indicating a component of pulmonary venous hypertension and left heart failure with minimal interstitial edema. The central airways are patent. PLEURA: Small right pleural effusion. MEDIASTINUM AND LYMPH NODES: No enlarged intrathoracic or axillary lymph nodes are present.  No other mediastinal abnormality. HEART AND VASCULATURE:Cardiac chambers globally dilated.  There is no pericardial effusion.  Ascending and descending aorta normal in caliber.  Pulmonary artery normal in size.  Extensive coronary calcification. BONES AND SOFT TISSUES: Unremarkable. UPPER ABDOMEN: Ascites. Medical renal disease. OTHER: Endotracheal tube satisfactory position with enteric tube in the stomach and catheter SVC/right atrium.     Features of volume overload with left and right heart failure with more extensive focal consolidation on the right and left base suggesting superimposed pneumonia or aspiration. The right lung was clear on the radiograph May 26.     US Renal Transplant Limited    Result Date: 09/11/2021  EXAM: US RENAL TRANSPLANT LIMITED DATE: 09/10/2021 11:35 PM ACCESSION: [id number] UN DICTATED: 09/11/2021 12:21 AM INTERPRETATION LOCATION: [facility] CLINICAL INDICATION: 70 years old Male with infectious workup COMPARISON: 03/21/2021 TECHNIQUE:  Ultrasound views of the renal transplant were obtained using gray scale and color Doppler imaging. FINDINGS: TRANSPLANTED KIDNEY: The renal transplant was located in the right lower quadrant. The hyperechoic lesion seen on previous imaging is now heterogeneous in appearance and largely isoechoic/hypoechoic to the surrounding parenchyma consistent with post cryoablation changes. No hyperemia or other signs of inflammation/infection. Multiple renal cysts and large renal sinus cyst. Otherwise normal size and echogenicity. No perinephric collections identified. No hydronephrosis. Please see below for data measurements: Transplant location: RLQ Renal Transplant: Sagittal 10.3 cm; AP 4.8 cm; Transverse 4.5 cm     Postcryoablation changes of the upper pole renal mass which is now heterogeneous and largely isoechoic/hypoechoic to the surrounding parenchyma. Given the lack of hyperemia/signs of inflammation, superinfection is not suspected.     US Abdomen Limited    Result Date: 09/16/2021  EXAM: US ABDOMEN LIMITED DATE: 09/16/2021 2:13 PM ACCESSION: [id number] UN DICTATED: 09/16/2021 3:20 PM INTERPRETATION LOCATION: [facility] CLINICAL INDICATION: 70 years old Male with ruq, continued rise in LFTs  TECHNIQUE: Static and cine images of the right upper quadrant were performed. COMPARISON: Liver Doppler 09/14/2021, CT AP 08/29/16 FINDINGS: Limited evaluation due to suboptimal ultrasound penetration secondary to patient inability to tolerate positioning. LIVER: The liver is normal in echogenicity. 6 mm echogenic focus of the anterior left liver with associated twinkle artifact, likely calcification. No biliary ductal dilatation. GALLBLADDER: The gallbladder is physiologically distended. Small shadowing gallstones at the fundus. Sonographic Eulah Pont sign is unable to be assessed as the patient was medicated.   No pericholecystic fluid. No gallbladder wall thickening. Tiny LIMITED RIGHT KIDNEY: No hydronephrosis. OTHER: Partially visualized right pleural effusion. Small to moderate volume abdominal ascites. Please see below for data measurements: Liver: 13.5 cm Gallbladder wall: 0.3 cm Sonographic Murphy's Sign: patient medicated Pericholecystic fluid visualized: no Proximal common bile duct: 0.4 cm     -Cholelithiasis without sonographic evidence of acute cholecystitis. -Right pleural effusion and ascites seen on prior US.    US Abdomen Limited    Result Date: 09/11/2021  EXAM: US ABDOMEN LIMITED DATE: 09/10/2021 11:35 PM ACCESSION:  [id number] UN DICTATED: 09/11/2021 12:37 AM INTERPRETATION LOCATION: [facility] CLINICAL INDICATION: 70 years old Male with RUQ to eval rising bili's and infection w/u  TECHNIQUE: Static and cine images of the right upper quadrant were performed. COMPARISON: MRI abdomen 01/30/2021 FINDINGS: LIVER: The liver is heterogeneous with tiny echogenic foci throughout and mildly nodular contour. Geographic area of echogenicity with capsular retraction in the posterior inferior liver, consistent with fibrosis. Echogenic focus with twinkle artifact or posterior shadowing consistent with calcification in the anterior inferior right lobe, unchanged from prior. No biliary ductal dilatation. GALLBLADDER: The gallbladder contains stones and sludge. Sonographic Eulah Pont sign is unable to be assessed secondary to patient premedication.   Pericholecystic fluid present. Borderline gallbladder wall thickening, likely related to ascites and hepatic disease. LIMITED RIGHT KIDNEY: No hydronephrosis. OTHER: Small right pleural effusion. Small volume ascites. Please see below for data measurements: Liver: 15.7 cm Gallbladder wall: 0.31 cm Sonographic Murphy's Sign: patient medicated Proximal common bile duct: 0.47 cm     -- Heterogeneous liver with tiny echogenic foci throughout which can be seen with hepatitis. Likely underlying fibrosis/cirrhosis. -- Small volume ascites and small right pleural effusion.     US Liver Doppler    Result Date: 09/14/2021  EXAM: US LIVER DOPPLER DATE: 09/14/2021 6:02 PM ACCESSION: [id number] UN DICTATED: 09/14/2021 3:38 PM INTERPRETATION LOCATION: [facility] CLINICAL INDICATION: 70 years old Male with elev transaminases, sepsis  TECHNIQUE: Limited ultrasound views of the hepatic vasculature and gallbladder were obtained using gray scale and color and spectral Doppler imaging. COMPARISON: MRI abdomen dated 01/30/2021 and prior. FINDINGS: LIVER: The liver is normal in echogenicity. No focal hepatic lesions. No intrahepatic biliary ductal dilatation. The common bile duct is normal in caliber. GALLBLADDER: The gallbladder is physiologically distended without internal stones or sludge. Sonographic Eulah Pont sign is unable to be assessed secondary to administration of pain medication.   No pericholecystic fluid. No gallbladder wall thickening, measuring 3.1 mm. PANCREAS: Mild nonspecific prominence of the pancreatic duct measuring up to 0.4 cm. SPLEEN: Normal in size and echotexture. KIDNEYS: Normal in size and echotexture. No solid masses or calculi. No hydronephrosis. VESSELS - Portal vein: The main, left and right portal veins are patent with hepatopetal flow. Normal main portal vein velocity (0.20 m/s or greater). Mildly sluggish flow in the right portal vein. - Splenic vein: Patent, with hepatopetal flow. - Hepatic veins/IVC: The IVC, left, middle and right hepatic veins are patent with bi/triphasic waveforms. - Hepatic artery: Patent with color and spectral Doppler imaging - Visualized proximal aorta:  unremarkable OTHER: Moderate volume ascites.     -- Patent hepatic vasculature with normal flow direction. No portal vein thrombus identified. -- Moderate volume ascites. -- Small right pleural effusion with associated adjacent atelectasis. Please see below for data measurements: Common bile duct: 0.52 cm Main portal vein diameter: 1 cm Main portal vein velocity: 0.29 m/s Anterior right portal vein velocity: 0.25 m/s Posterior right portal vein velocity: 0.17 m/s Left portal vein velocity: 0.41 m/s Main portal vein flow: hepatopetal Right portal vein flow: hepatopetal Left portal vein flow: hepatopetal Common hepatic artery: Patent Left hepatic vein flow: biphasic Middle hepatic vein flow: biphasic Right hepatic vein flow: biphasic Inferior vena cava flow: biphasic Splenic vein midline: hepatopetal Splenic vein proximal: hepatopetal Aorta: not well visualized Inferior vena cava: Partially visualized Abdominal free fluid visualized: yes     Echocardiogram Follow Up/Limited Echo    Result Date: 09/08/2021  Patient Info Name:     Miguel Ward Age:  69 years DOB:     1951/06/03 Gender:     Male MRN:     [id number] Accession #:     [id number] UN Ht:     178 cm Wt:     72 kg BSA:     1.88 m2 BP:     99 /     54 mmHg Technical Quality:     Fair Exam Date:     09/08/2021 11:10 AM Site Location:     [facility] Exam Location:     [facility] Admit Date:     2021/10/01 Exam Type:     ECHOCARDIOGRAM FOLLOW UP/LIMITED ECHO Study Info Indications      - eval hf,  two pressor shock Limited 2D, color flow and Doppler transthoracic echocardiogram is performed. Staff Referring Physician:     [phone], Zyia Kaneko; Reading Fellow:     Elana Alm MD Sonographer:     Lavonda Jumbo RDCS, RVT Ordering Physician:     Caesar Chestnut SADIM Account #:     [id number] Summary   1. The left ventricle is normal in size with mildly increased wall thickness.   2. The left ventricular systolic function is severely decreased, LVEF is visually estimated at 30%.   3. The mitral valve leaflets are mildly thickened with normal leaflet mobility.   4. There is mild mitral valve regurgitation.   5. The left atrium is moderately dilated in size.   6. The ascending aorta is mildly dilated.   7. The right ventricle is moderately dilated in size, with moderately reduced systolic function.   8. There is moderate tricuspid regurgitation.   9. The right atrium is moderately dilated  in size. Left Ventricle   The left ventricle is normal in size with mildly increased wall thickness.   The left ventricular systolic function is severely decreased, LVEF is visually estimated at 30%.   Left ventricular diastolic function cannot be accurately assessed. Right Ventricle   The right ventricle is moderately dilated in size, with moderately reduced systolic function. Left Atrium   The left atrium is moderately dilated in size. Right Atrium   The right atrium is moderately dilated  in size. Aortic Valve   The aortic valve is trileaflet with normal appearing leaflets with normal excursion.   There is no significant aortic regurgitation.   There is no evidence of a significant transvalvular gradient. Mitral Valve   The mitral valve leaflets are mildly thickened with normal leaflet mobility.   There is mild mitral valve regurgitation. Tricuspid Valve   The tricuspid valve leaflets are normal, with normal leaflet mobility.   There is moderate tricuspid regurgitation.   There is no pulmonary hypertension.   TR maximum velocity: 2.5 m/s. Other Findings   Rhythm: Atrial Flutter. Pericardium/Pleural   There is no pericardial effusion. Inferior Vena Cava   Mechanical ventilation precludes the ability to accurately assess right atrial pressure. Aorta   The ascending aorta is mildly dilated. Tricuspid Valve ---------------------------------------------------------------------- Name                                 Value        Normal ---------------------------------------------------------------------- TV Regurgitation Doppler ---------------------------------------------------------------------- TR Peak Velocity                     3 m/s Aorta ---------------------------------------------------------------------- Name  Value        Normal ---------------------------------------------------------------------- Ascending Aorta ---------------------------------------------------------------------- Ao Root Diameter (2D)               4.0 cm               Ao Root Diam Index (2D)         21.0 cm/m2               Asc Ao Diameter                     3.9 cm Venous ---------------------------------------------------------------------- Name                                 Value        Normal ---------------------------------------------------------------------- IVC/SVC ---------------------------------------------------------------------- IVC Diameter (Exp 2D)               3.0 cm         <=2.1 Ventricles ---------------------------------------------------------------------- Name                                 Value        Normal ---------------------------------------------------------------------- LV Dimensions 2D/MM ---------------------------------------------------------------------- IVS Diastolic Thickness (2D)        1.2 cm       0.6-1.0 LVID Diastole (2D)                  5.5 cm       4.2-5.8 LVPW Diastolic Thickness (2D)                                1.2 cm       0.6-1.0 LVID Systole (2D)                   4.6 cm       2.5-4.0 RV Dimensions 2D/MM ---------------------------------------------------------------------- TAPSE                               1.2 cm         >=1.7 Report Signatures Finalized by Andrey Campanile  MD on 09/08/2021 09:50 PM Resident Lorenda Hatchet  MD on 09/08/2021 02:33 PM    XR Abdomen 1 View    Result Date: 09/14/2021  EXAM: XR ABDOMEN 1 VIEW DATE: 09/14/2021 6:06 PM ACCESSION: [id number] UN DICTATED: 09/14/2021 6:22 PM [facility]PRE[facility] LOC[facility] CLINICAL INDICATION: 70 years old Male with NGT and NDT (CATHETER VASCULAR FIT & ADJ)  COMPARISON: No recent prior TECHNIQUE: Portable supine abdomen was performed FINDINGS: Esophagogastric tube is present with the tip and side-port overlying the gastric body. Nonobstructive bowel pattern. Vascular calcifications are seen within the left upper quadrant. No free air. Patchy opacities within the bilateral lung bases.     Esophagogastric tube is present with the tip and side-port overlying the gastric body    XR Abdomen 1 View    Result Date: 09/08/2021  EXAM: XR ABDOMEN 1 VIEW DATE: 09/08/2021 3:47 PM ACCESSION: [id number] UN DICTATED: 09/08/2021 3:48 PM [facility]PRET[facility] LOCATION: [facility] CLINICAL INDICATION: 70 years old Male with NGT and NDT (CATHETER VASCULAR FIT & ADJ)  COMPARISON: Abdominal radiograph dated 10/07/2021. TECHNIQUE: Supine view of the abdomen. FINDINGS: Nonweighted esophagogastric tube with tip projecting over the stomach.  Side port is at the level of the GE junction. Partially visualized central venous catheter with tip near the cavoatrial junction. No dilated loops of bowel. Lungs better evaluated on concurrent chest radiograph.     Esophagogastric tube side-port at the level of the GE junction. Recommend advancement by approximately 5 cm for optimal positioning.    XR Abdomen 1 View    Result Date: 09/02/2021  EXAM: XR ABDOMEN 1 VIEW DATE: 08/18/2021 2:34 PM ACCESSION: [id number] UN DICTATED: 08/22/2021 3:25 PM [facility]PRET[facility] LOC[facility] CLINICAL INDICATION: 70 years old Male with NAUSEA ALONE  COMPARISON: No recent radiograph. TECHNIQUE: Supine view of the abdomen. FINDINGS: AP portable semiupright radiograph of the abdomen was performed. An esophagogastric tube courses over the esophagus and below the diaphragm with distal tip projecting over the stomach. Endotracheal tube projects at the level of the clavicles. EKG leads overlie the chest. Diffuse right pulmonary airspace opacities with air bronchograms are present, predominantly in the right upper lobe. Please refer to dedicated chest radiograph for additional discussion. Limited evaluation of the bowel gas pattern due to field-of-view.     Tip of the esophagogastric tube projects over the stomach.      _____________________________________________________________________________  Discharge Instructions:

## 2021-09-24 NOTE — Unmapped (Signed)
7a shift summary:  Q2 hour turns/skin care completed and documented. patient transitioned to comfort care. Start on dilaudid gtt. Will continue to monitor.        Problem: Fall Injury Risk  Goal: Absence of Fall and Fall-Related Injury  Outcome: Ongoing - Unchanged  Intervention: Promote Scientist, clinical (histocompatibility and immunogenetics) Documentation  Taken 09/17/2021 0800 by Geanie Berlin, RN  Safety Interventions:   aspiration precautions   fall reduction program maintained   infection management   lighting adjusted for tasks/safety   low bed   nonskid shoes/slippers when out of bed     Problem: Infection  Goal: Absence of Infection Signs and Symptoms  Outcome: Ongoing - Unchanged     Problem: Adult Inpatient Plan of Care  Goal: Plan of Care Review  Outcome: Ongoing - Unchanged  Goal: Patient-Specific Goal (Individualized)  Outcome: Ongoing - Unchanged  Goal: Absence of Hospital-Acquired Illness or Injury  Outcome: Ongoing - Unchanged  Intervention: Identify and Manage Fall Risk  Recent Flowsheet Documentation  Taken 10/03/2021 0800 by Geanie Berlin, RN  Safety Interventions:   aspiration precautions   fall reduction program maintained   infection management   lighting adjusted for tasks/safety   low bed   nonskid shoes/slippers when out of bed  Intervention: Prevent and Manage VTE (Venous Thromboembolism) Risk  Recent Flowsheet Documentation  Taken 10/11/2021 0800 by Geanie Berlin, RN  Activity Management: bedrest  Goal: Optimal Comfort and Wellbeing  Outcome: Ongoing - Unchanged  Goal: Readiness for Transition of Care  Outcome: Ongoing - Unchanged  Goal: Rounds/Family Conference  Outcome: Ongoing - Unchanged     Problem: Skin Injury Risk Increased  Goal: Skin Health and Integrity  Outcome: Ongoing - Unchanged  Intervention: Optimize Skin Protection  Recent Flowsheet Documentation  Taken 10/04/2021 1600 by Geanie Berlin, RN  Pressure Reduction Techniques:   frequent weight shift encouraged   heels elevated off bed  Head of Bed (HOB) Positioning: HOB at 30-45 degrees  Taken 10/02/2021 1400 by Geanie Berlin, RN  Pressure Reduction Techniques:   frequent weight shift encouraged   heels elevated off bed  Taken 10/13/2021 1200 by Geanie Berlin, RN  Pressure Reduction Techniques:   frequent weight shift encouraged   heels elevated off bed  Head of Bed [facility]) Positioning: HOB at 30 degrees  Taken 10/04/2021 1000 by Geanie Berlin, RN  Pressure Reduction Techniques:   frequent weight shift encouraged   heels elevated off bed  Taken 10/01/2021 0800 by Geanie Berlin, RN  Pressure Reduction Techniques:   frequent weight shift encouraged   heels elevated off bed  Head of Bed (HOB) Positioning: HOB at 30-45 degrees     Problem: Dysrhythmia (Heart Failure)  Goal: Stable Heart Rate and Rhythm  Outcome: Ongoing - Unchanged     Problem: Fluid Imbalance (Heart Failure)  Goal: Fluid Balance  Outcome: Ongoing - Unchanged     Problem: Respiratory Compromise (Heart Failure)  Goal: Effective Oxygenation and Ventilation  Outcome: Ongoing - Unchanged     Problem: Impaired Wound Healing  Goal: Optimal Wound Healing  Outcome: Ongoing - Unchanged  Intervention: Promote Wound Healing  Recent Flowsheet Documentation  Taken 10/02/2021 0800 by Geanie Berlin, RN  Activity Management: bedrest     Problem: Self-Care Deficit  Goal: Improved Ability to Complete Activities of Daily Living  Outcome: Ongoing - Unchanged     Problem: Diabetes Comorbidity  Goal: Blood Glucose Level Within Targeted Range  Outcome: Ongoing -  Unchanged     Problem: Heart Failure Comorbidity  Goal: Maintenance of Heart Failure Symptom Control  Outcome: Ongoing - Unchanged     Problem: Hypertension Comorbidity  Goal: Blood Pressure in Desired Range  Outcome: Ongoing - Unchanged     Problem: Device-Related Complication Risk (Hemodialysis)  Goal: Safe, Effective Therapy Delivery  Outcome: Ongoing - Unchanged     Problem: Hemodynamic Instability (Hemodialysis)  Goal: Effective Tissue Perfusion  Outcome: Ongoing - Unchanged     Problem: Infection (Hemodialysis)  Goal: Absence of Infection Signs and Symptoms  Outcome: Ongoing - Unchanged

## 2021-09-24 NOTE — Unmapped (Signed)
Problem: Fall Injury Risk  Goal: Absence of Fall and Fall-Related Injury  09-25-2021 1853 by Geanie Berlin, RN  Outcome: Patient Expired  09/25/21 1706 by Geanie Berlin, RN  Outcome: Ongoing - Unchanged  Intervention: Promote Injury-Free Environment  Recent Flowsheet Documentation  Taken 2021-09-25 0800 by Geanie Berlin, RN  Safety Interventions:   aspiration precautions   fall reduction program maintained   infection management   lighting adjusted for tasks/safety   low bed   nonskid shoes/slippers when out of bed     Problem: Infection  Goal: Absence of Infection Signs and Symptoms  2021/09/25 1853 by Geanie Berlin, RN  Outcome: Patient Expired  09-25-2021 1706 by Geanie Berlin, RN  Outcome: Ongoing - Unchanged     Problem: Adult Inpatient Plan of Care  Goal: Plan of Care Review  September 25, 2021 1853 by Geanie Berlin, RN  Outcome: Patient Expired  09/25/21 1706 by Geanie Berlin, RN  Outcome: Ongoing - Unchanged  Goal: Patient-Specific Goal (Individualized)  09/25/2021 1853 by Geanie Berlin, RN  Outcome: Patient Expired  Sep 25, 2021 1706 by Geanie Berlin, RN  Outcome: Ongoing - Unchanged  Goal: Absence of Hospital-Acquired Illness or Injury  09/25/2021 1853 by Geanie Berlin, RN  Outcome: Patient Expired  09/25/21 1706 by Geanie Berlin, RN  Outcome: Ongoing - Unchanged  Intervention: Identify and Manage Fall Risk  Recent Flowsheet Documentation  Taken 2021/09/25 0800 by Geanie Berlin, RN  Safety Interventions:   aspiration precautions   fall reduction program maintained   infection management   lighting adjusted for tasks/safety   low bed   nonskid shoes/slippers when out of bed  Intervention: Prevent and Manage VTE (Venous Thromboembolism) Risk  Recent Flowsheet Documentation  Taken 09/25/2021 0800 by Geanie Berlin, RN  Activity Management: bedrest  Goal: Optimal Comfort and Wellbeing  09-25-2021 1853 by Geanie Berlin, RN  Outcome: Patient Expired  09-25-2021 1706 by Geanie Berlin, RN  Outcome: Ongoing - Unchanged  Goal: Readiness for Transition of Care  2021-09-25 1853 by Geanie Berlin, RN  Outcome: Patient Expired  09/25/2021 1706 by Geanie Berlin, RN  Outcome: Ongoing - Unchanged  Goal: Rounds/Family Conference  2021/09/25 1853 by Geanie Berlin, RN  Outcome: Patient Expired  09-25-2021 1706 by Geanie Berlin, RN  Outcome: Ongoing - Unchanged     Problem: Skin Injury Risk Increased  Goal: Skin Health and Integrity  09-25-21 1853 by Geanie Berlin, RN  Outcome: Patient Expired  Sep 25, 2021 1706 by Geanie Berlin, RN  Outcome: Ongoing - Unchanged  Intervention: Optimize Skin Protection  Recent Flowsheet Documentation  Taken 09/25/2021 1800 by Geanie Berlin, RN  Pressure Reduction Techniques:   frequent weight shift encouraged   heels elevated off bed  Taken 2021/09/25 1600 by Geanie Berlin, RN  Pressure Reduction Techniques:   frequent weight shift encouraged   heels elevated off bed  Head of Bed (HOB) Positioning: HOB at 30-45 degrees  Taken 09-25-21 1400 by Geanie Berlin, RN  Pressure Reduction Techniques:   frequent weight shift encouraged   heels elevated off bed  Taken 09-25-21 1200 by Geanie Berlin, RN  Pressure Reduction Techniques:   frequent weight shift encouraged   heels elevated off bed  Head of Bed [facility]) Positioning: HOB at 30 degrees  Taken 09/25/2021 1000 by Geanie Berlin, RN  Pressure Reduction Techniques:   frequent weight shift encouraged   heels elevated off bed  Taken 10/02/2021 0800 by Geanie Berlin, RN  Pressure Reduction Techniques:   frequent weight shift encouraged   heels elevated off bed  Head of Bed (HOB) Positioning: HOB at 30-45 degrees     Problem: Dysrhythmia (Heart Failure)  Goal: Stable Heart Rate and Rhythm  10/02/2021 1853 by Geanie Berlin, RN  Outcome: Patient Expired  10/09/2021 1706 by Geanie Berlin, RN  Outcome: Ongoing - Unchanged     Problem: Fluid Imbalance (Heart Failure)  Goal: Fluid Balance  10/12/2021 1853 by Geanie Berlin, RN  Outcome: Patient Expired  09/20/2021 1706 by Geanie Berlin, RN  Outcome: Ongoing - Unchanged     Problem: Respiratory Compromise (Heart Failure)  Goal: Effective Oxygenation and Ventilation  09/19/2021 1853 by Geanie Berlin, RN  Outcome: Patient Expired  10/13/2021 1706 by Geanie Berlin, RN  Outcome: Ongoing - Unchanged     Problem: Impaired Wound Healing  Goal: Optimal Wound Healing  10/06/2021 1853 by Geanie Berlin, RN  Outcome: Patient Expired  09/17/2021 1706 by Geanie Berlin, RN  Outcome: Ongoing - Unchanged  Intervention: Promote Wound Healing  Recent Flowsheet Documentation  Taken 10/01/2021 0800 by Geanie Berlin, RN  Activity Management: bedrest     Problem: Self-Care Deficit  Goal: Improved Ability to Complete Activities of Daily Living  10/13/2021 1853 by Geanie Berlin, RN  Outcome: Patient Expired  10/07/2021 1706 by Geanie Berlin, RN  Outcome: Ongoing - Unchanged     Problem: Diabetes Comorbidity  Goal: Blood Glucose Level Within Targeted Range  10/01/2021 1853 by Geanie Berlin, RN  Outcome: Patient Expired  10/06/2021 1706 by Geanie Berlin, RN  Outcome: Ongoing - Unchanged     Problem: Heart Failure Comorbidity  Goal: Maintenance of Heart Failure Symptom Control  09/14/2021 1853 by Geanie Berlin, RN  Outcome: Patient Expired  09/26/2021 1706 by Geanie Berlin, RN  Outcome: Ongoing - Unchanged     Problem: Hypertension Comorbidity  Goal: Blood Pressure in Desired Range  10/02/2021 1853 by Geanie Berlin, RN  Outcome: Patient Expired  10/13/2021 1706 by Geanie Berlin, RN  Outcome: Ongoing - Unchanged     Problem: Device-Related Complication Risk (Hemodialysis)  Goal: Safe, Effective Therapy Delivery  10/10/2021 1853 by Geanie Berlin, RN  Outcome: Patient Expired  09/24/2021 1706 by Geanie Berlin, RN  Outcome: Ongoing - Unchanged     Problem: Hemodynamic Instability (Hemodialysis)  Goal: Effective Tissue Perfusion  10/07/2021 1853 by Geanie Berlin, RN  Outcome: Patient Expired  09/26/2021 1706 by Geanie Berlin, RN  Outcome: Ongoing - Unchanged     Problem: Infection (Hemodialysis)  Goal: Absence of Infection Signs and Symptoms  10/07/2021 1853 by Geanie Berlin, RN  Outcome: Patient Expired  09/25/2021 1706 by Geanie Berlin, RN  Outcome: Ongoing - Unchanged

## 2021-09-24 NOTE — Unmapped (Signed)
Patient died on 2021/09/28 at 78 at [facility] from ARDS due to COVID 19. Patient did not have a functioning kidney at his time of death. Transplant tab updated and email sent

## 2021-10-02 LAB — LEGIONELLA ANTIGEN, URINE: LEGIONELLA, URINARY ANTIGEN: POSITIVE — AB

## 2021-10-14 DEATH — deceased

## 2021-12-23 MED ORDER — OMEPRAZOLE 20 MG CAPSULE,DELAYED RELEASE
ORAL_CAPSULE | 3 refills | 0 days
Start: 2021-12-23 — End: ?
